# Patient Record
Sex: Male | Born: 1955 | Race: White | Hispanic: No | Marital: Married | Smoking: Former smoker
Health system: Southern US, Community
[De-identification: ages and names within clinical notes are randomized; demographics above are authoritative.]

## PROBLEM LIST (undated history)

## (undated) DIAGNOSIS — E119 Type 2 diabetes mellitus without complications: Secondary | ICD-10-CM

## (undated) DIAGNOSIS — I214 Non-ST elevation (NSTEMI) myocardial infarction: Secondary | ICD-10-CM

## (undated) DIAGNOSIS — C61 Malignant neoplasm of prostate: Secondary | ICD-10-CM

## (undated) DIAGNOSIS — E0590 Thyrotoxicosis, unspecified without thyrotoxic crisis or storm: Secondary | ICD-10-CM

## (undated) DIAGNOSIS — J45909 Unspecified asthma, uncomplicated: Secondary | ICD-10-CM

## (undated) DIAGNOSIS — I2510 Atherosclerotic heart disease of native coronary artery without angina pectoris: Secondary | ICD-10-CM

## (undated) DIAGNOSIS — I10 Essential (primary) hypertension: Secondary | ICD-10-CM

## (undated) DIAGNOSIS — J449 Chronic obstructive pulmonary disease, unspecified: Secondary | ICD-10-CM

## (undated) HISTORY — DX: Non-ST elevation (NSTEMI) myocardial infarction: I21.4

## (undated) HISTORY — DX: Type 2 diabetes mellitus without complications: E11.9

## (undated) HISTORY — PX: APPENDECTOMY: SHX54

## (undated) HISTORY — PX: TONSILLECTOMY: SUR1361

## (undated) HISTORY — DX: Essential (primary) hypertension: I10

---

## 2012-06-14 DIAGNOSIS — I214 Non-ST elevation (NSTEMI) myocardial infarction: Secondary | ICD-10-CM

## 2012-06-14 HISTORY — DX: Non-ST elevation (NSTEMI) myocardial infarction: I21.4

## 2012-12-05 ENCOUNTER — Telehealth: Payer: Self-pay

## 2012-12-05 NOTE — Telephone Encounter (Signed)
LATE ENTRY :   Pt was referred by Dr. Sherryll Burger for screening colonoscopy. I sent him a letter and he called yesterday and said he needs to schedule at a office and not hospital. [place] Spaniel he had spoke to insurance co and that is what they told him. He is going to call Dr. Margaretmary Eddy office and have them refer him to [place]. I am sending Dr. Sherryll Burger a letter also.

## 2013-02-03 DIAGNOSIS — R079 Chest pain, unspecified: Secondary | ICD-10-CM

## 2014-02-20 ENCOUNTER — Encounter: Payer: Self-pay | Admitting: Radiation Oncology

## 2014-04-12 ENCOUNTER — Other Ambulatory Visit: Payer: Self-pay | Admitting: Urology

## 2014-04-16 ENCOUNTER — Other Ambulatory Visit (HOSPITAL_COMMUNITY): Payer: Self-pay | Admitting: Urology

## 2014-04-16 DIAGNOSIS — C61 Malignant neoplasm of prostate: Secondary | ICD-10-CM

## 2014-05-03 ENCOUNTER — Encounter (HOSPITAL_COMMUNITY)
Admission: RE | Admit: 2014-05-03 | Discharge: 2014-05-03 | Disposition: A | Discharge status: Home / Self Care | Payer: BC Managed Care – PPO | Source: Ambulatory Visit | Attending: Urology | Admitting: Urology

## 2014-05-03 DIAGNOSIS — C61 Malignant neoplasm of prostate: Secondary | ICD-10-CM | POA: Diagnosis not present

## 2014-05-03 MED ORDER — TECHNETIUM TC 99M MEDRONATE IV KIT
26.2000 | PACK | Freq: Once | INTRAVENOUS | Status: AC | PRN
  Administered 2014-05-03: 26.2 via INTRAVENOUS

## 2014-06-20 NOTE — Patient Instructions (Addendum)
Hunter Jones  06/20/2014   Your procedure is scheduled on: 06/27/14   Report to [facility]  Entrance and follow signs to               [place] at 9:00  AM.   Call this number if you have problems the morning of surgery [phone]   Remember:  Do not eat food or drink liquids :After Midnight.     Take these medicines the morning of surgery with A SIP OF WATER: METOPROLOL / USE SYMBICORT INHALER / BRING ALBUTEROL [place] may not have any metal on your body including hair pins and              piercings  Do not wear jewelry, make-up, lotions, powders or perfumes.             Do not wear nail polish.  Do not shave  48 hours prior to surgery.              Men may shave face and neck.   Do not bring valuables to the hospital. [place].  Contacts, dentures or bridgework may not be worn into surgery.  Leave suitcase in the car. After surgery it may be brought to your room.     Patients discharged the day of surgery will not be allowed to drive home.  Name and phone number of your driver:  Special Instructions: N/A              Please read over the following fact sheets you were given: _____________________________________________________________________                                                     [place]  Before surgery, you can play an important role.  Because skin is not sterile, your skin needs to be as free of germs as possible.  You can reduce the number of germs on your skin by washing with CHG (chlorahexidine gluconate) soap before surgery.  CHG is an antiseptic cleaner which kills germs and bonds with the skin to continue killing germs even after washing. Please DO NOT use if you have an allergy to CHG or antibacterial soaps.  If your skin becomes reddened/irritated stop using the CHG and inform your nurse when  you arrive at Short Stay. Do not shave (including legs and underarms) for at least 48 hours prior to the first CHG shower.  You may shave your face. Please follow these instructions carefully:   1.  Shower with CHG Soap the night before surgery and the  morning of Surgery.   2.  If you choose to wash your hair, wash your hair first as usual with your  normal  Shampoo.   3.  After you shampoo, rinse your hair and body thoroughly to remove the  shampoo.  4.  Use CHG as you would any other liquid soap.  You can apply chg directly  to the skin and wash . Gently wash with scrungie or clean wascloth    5.  Apply the CHG Soap to your body ONLY FROM THE NECK DOWN.   Do not use on open                           Wound or open sores. Avoid contact with eyes, ears mouth and genitals (private parts).                        Genitals (private parts) with your normal soap.              6.  Wash thoroughly, paying special attention to the area where your surgery  will be performed.   7.  Thoroughly rinse your body with warm water from the neck down.   8.  DO NOT shower/wash with your normal soap after using and rinsing off  the CHG Soap .                9.  Pat yourself dry with a clean towel.             10.  Wear clean pajamas.             11.  Place clean sheets on your bed the night of your first shower and do not  sleep with pets.  Day of Surgery : Do not apply any lotions/deodorants the morning of surgery.  Please wear clean clothes to the hospital/surgery center.  FAILURE TO FOLLOW THESE INSTRUCTIONS MAY RESULT IN THE CANCELLATION OF YOUR SURGERY    PATIENT SIGNATURE_________________________________  ______________________________________________________________________     Hunter Jones  An incentive spirometer is a tool that can help keep your lungs clear and active. This tool measures how well you are filling your lungs with each  breath. Taking long deep breaths may help reverse or decrease the chance of developing breathing (pulmonary) problems (especially infection) following:  A long period of time when you are unable to move or be active. BEFORE THE PROCEDURE   If the spirometer includes an indicator to show your best effort, your nurse or respiratory therapist will set it to a desired goal.  If possible, sit up straight or lean slightly forward. Try not to slouch.  Hold the incentive spirometer in an upright position. INSTRUCTIONS FOR USE   Sit on the edge of your bed if possible, or sit up as far as you can in bed or on a chair.  Hold the incentive spirometer in an upright position.  Breathe out normally.  Place the mouthpiece in your mouth and seal your lips tightly around it.  Breathe in slowly and as deeply as possible, raising the piston or the ball toward the top of the column.  Hold your breath for 3-5 seconds or for as long as possible. Allow the piston or ball to fall to the bottom of the column.  Remove the mouthpiece from your mouth and breathe out normally.  Rest for a few seconds and repeat Steps 1 through 7 at least 10 times every 1-2 hours when you are awake. Take your time and take a few normal breaths between deep breaths.  The spirometer may include an indicator to show your best effort. Use the indicator as a goal to work toward during  each repetition.  After each set of 10 deep breaths, practice coughing to be sure your lungs are clear. If you have an incision (the cut made at the time of surgery), support your incision when coughing by placing a pillow or rolled up towels firmly against it. Once you are able to get out of bed, walk around indoors and cough well. You may stop using the incentive spirometer when instructed by your caregiver.  RISKS AND COMPLICATIONS  Take your time so you do not get dizzy or light-headed.  If you are in pain, you may need to take or ask for pain  medication before doing incentive spirometry. It is harder to take a deep breath if you are having pain. AFTER USE  Rest and breathe slowly and easily.  It can be helpful to keep track of a log of your progress. Your caregiver can provide you with a simple table to help with this. If you are using the spirometer at home, follow these instructions: [place] IF:   You are having difficultly using the spirometer.  You have trouble using the spirometer as often as instructed.  Your pain medication is not giving enough relief while using the spirometer.  You develop fever of 100.5 F (38.1 C) or higher. SEEK IMMEDIATE MEDICAL CARE IF:   You cough up bloody sputum that had not been present before.  You develop fever of 102 F (38.9 C) or greater.  You develop worsening pain at or near the incision site. MAKE SURE YOU:   Understand these instructions.  Will watch your condition.  Will get help right away if you are not doing well or get worse. Document Released: 10/11/2006 Document Revised: 08/23/2011 Document Reviewed: 12/12/2006 ExitCare Patient Information 2014 ExitCare, [place].   ________________________________________________________________________  WHAT IS A BLOOD TRANSFUSION? Blood Transfusion Information  A transfusion is the replacement of blood or some of its parts. Blood is made up of multiple cells which provide different functions.  Red blood cells carry oxygen and are used for blood loss replacement.  White blood cells fight against infection.  Platelets control bleeding.  Plasma helps clot blood.  Other blood products are available for specialized needs, such as hemophilia or other clotting disorders. BEFORE THE TRANSFUSION  Who gives blood for transfusions?   Healthy volunteers who are fully evaluated to make sure their blood is safe. This is blood bank blood. Transfusion therapy is the safest it has ever been in the practice of medicine.  Before blood is taken from a donor, a complete history is taken to make sure that person has no history of diseases nor engages in risky social behavior (examples are intravenous drug use or sexual activity with multiple partners). The donor's travel history is screened to minimize risk of transmitting infections, such as malaria. The donated blood is tested for signs of infectious diseases, such as HIV and hepatitis. The blood is then tested to be sure it is compatible with you in order to minimize the chance of a transfusion reaction. If you or a relative donates blood, this is often done in anticipation of surgery and is not appropriate for emergency situations. It takes many days to process the donated blood. RISKS AND COMPLICATIONS Although transfusion therapy is very safe and saves many lives, the main dangers of transfusion include:   Getting an infectious disease.  Developing a transfusion reaction. This is an allergic reaction to something in the blood you were given. Every precaution is taken to prevent  this. The decision to have a blood transfusion has been considered carefully by your caregiver before blood is given. Blood is not given unless the benefits outweigh the risks. AFTER THE TRANSFUSION  Right after receiving a blood transfusion, you will usually feel much better and more energetic. This is especially true if your red blood cells have gotten low (anemic). The transfusion raises the level of the red blood cells which carry oxygen, and this usually causes an energy increase.  The nurse administering the transfusion will monitor you carefully for complications. HOME CARE INSTRUCTIONS  No special instructions are needed after a transfusion. You may find your energy is better. Speak with your caregiver about any limitations on activity for underlying diseases you may have. SEEK MEDICAL CARE IF:   Your condition is not improving after your transfusion.  You develop redness or  irritation at the intravenous (IV) site. SEEK IMMEDIATE MEDICAL CARE IF:  Any of the following symptoms occur over the next 12 hours:  Shaking chills.  You have a temperature by mouth above 102 F (38.9 C), not controlled by medicine.  Chest, back, or muscle pain.  People around you feel you are not acting correctly or are confused.  Shortness of breath or difficulty breathing.  Dizziness and fainting.  You get a rash or develop hives.  You have a decrease in urine output.  Your urine turns a dark color or changes to pink, red, or brown. Any of the following symptoms occur over the next 10 days:  You have a temperature by mouth above 102 F (38.9 C), not controlled by medicine.  Shortness of breath.  Weakness after normal activity.  The white part of the eye turns yellow (jaundice).  You have a decrease in the amount of urine or are urinating less often.  Your urine turns a dark color or changes to pink, red, or brown. Document Released: 05/28/2000 Document Revised: 08/23/2011 Document Reviewed: 01/15/2008 ExitCare Patient Information 2014 ExitCare, [place].  _______________________________________________________________________   CLEAR LIQUID DIET   Foods Allowed                                                                     Foods Excluded  Coffee and tea, regular and decaf                             liquids that you cannot  Plain Jell-O in any flavor                                             see through such as: Fruit ices (not with fruit pulp)                                     milk, soups, orange juice  Iced Popsicles                                    All solid food  Carbonated beverages, regular and diet                                    Cranberry, grape and apple juices Sports drinks like Gatorade Lightly seasoned clear broth or consume(fat free) Sugar, honey syrup   _____________________________________________________________________

## 2014-06-21 ENCOUNTER — Ambulatory Visit (HOSPITAL_COMMUNITY)
Admission: RE | Admit: 2014-06-21 | Discharge: 2014-06-21 | Disposition: A | Discharge status: Home / Self Care | Payer: BLUE CROSS/BLUE SHIELD | Source: Ambulatory Visit | Attending: Anesthesiology | Admitting: Anesthesiology

## 2014-06-21 ENCOUNTER — Encounter (HOSPITAL_COMMUNITY)
Admission: RE | Admit: 2014-06-21 | Discharge: 2014-06-21 | Disposition: A | Discharge status: Home / Self Care | Payer: BLUE CROSS/BLUE SHIELD | Source: Ambulatory Visit | Attending: Urology | Admitting: Urology

## 2014-06-21 ENCOUNTER — Encounter (HOSPITAL_COMMUNITY): Payer: Self-pay

## 2014-06-21 DIAGNOSIS — Z01818 Encounter for other preprocedural examination: Secondary | ICD-10-CM | POA: Insufficient documentation

## 2014-06-21 DIAGNOSIS — C61 Malignant neoplasm of prostate: Secondary | ICD-10-CM | POA: Insufficient documentation

## 2014-06-21 HISTORY — DX: Chronic obstructive pulmonary disease, unspecified: J44.9

## 2014-06-21 HISTORY — DX: Atherosclerotic heart disease of native coronary artery without angina pectoris: I25.10

## 2014-06-21 HISTORY — DX: Thyrotoxicosis, unspecified without thyrotoxic crisis or storm: E05.90

## 2014-06-21 HISTORY — DX: Malignant neoplasm of prostate: C61

## 2014-06-21 LAB — CBC
HEMATOCRIT: 41.8 % (ref 39.0–52.0)
Hemoglobin: 14.4 g/dL (ref 13.0–17.0)
MCH: 33.3 pg (ref 26.0–34.0)
MCHC: 34.4 g/dL (ref 30.0–36.0)
MCV: 96.8 fL (ref 78.0–100.0)
Platelets: 157 10*3/uL (ref 150–400)
RBC: 4.32 MIL/uL (ref 4.22–5.81)
RDW: 12.5 % (ref 11.5–15.5)
WBC: 8.9 10*3/uL (ref 4.0–10.5)

## 2014-06-21 LAB — BASIC METABOLIC PANEL
ANION GAP: 8 (ref 5–15)
BUN: 22 mg/dL (ref 6–23)
CO2: 27 mmol/L (ref 19–32)
CREATININE: 0.95 mg/dL (ref 0.50–1.35)
Calcium: 9.5 mg/dL (ref 8.4–10.5)
Chloride: 107 mEq/L (ref 96–112)
GFR calc Af Amer: >90 mL/min (ref >90)
GFR calc non Af Amer: 89 mL/min — ABNORMAL LOW (ref >90)
GLUCOSE: 95 mg/dL (ref 70–99)
Potassium: 4.7 mmol/L (ref 3.5–5.1)
Sodium: 142 mmol/L (ref 135–145)

## 2014-06-21 NOTE — Progress Notes (Signed)
   06/21/14 0933  OBSTRUCTIVE SLEEP APNEA  Have you ever been diagnosed with sleep apnea through a sleep study? No  Do you snore loudly (loud enough to be heard through closed doors)?  0  Do you often feel tired, fatigued, or sleepy during the daytime? 0  Has anyone observed you stop breathing during your sleep? 0  Do you have, or are you being treated for high blood pressure? 1  BMI more than 35 kg/m2? 1  Age over 61 years old? 1  Neck circumference greater than 40 cm/16 inches? 1  Gender: 1  Obstructive Sleep Apnea Score 5  Score 4 or greater  Results sent to PCP

## 2014-06-26 NOTE — H&P (Signed)
Chief Complaint Prostate Cancer    History of Present Illness [name] is a 59 year old gentleman who has a history of an elevated PSA that was apparently 6.4 when he was first evaluated by Dr. Exie Parody in 2014. He was unable to proceed with a biopsy at that time as he had recently had a cardiac stent requiring antiplatelet therapy with Plavix for one year. He therefore delayed his evaluation for this reason and his PSA increased further to 9.6 in 2015. He finally was able to discontinue his Plavix for a prostate biopsy on 02/11/14. 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 He has been thoroughly counseled about his treatment options by both Dr. Exie Parody and Dr. Ledon Snare (Radiation Oncology). He has not yet undergone staging studies. He did apparently receive an LHRH injection in the fall.    ** His comorbid conditions include coronary artery disease s/p stent placement in 2014 and managed with antiplatelet therapy. He currently is no longer taking Plavix but does take aspirin 81 mg. He has undergone a prior appendectomy.    TNM stage: cT3a Nx Mx ([place] and base)  PSA: 9.6  Gleason score: 4+4=8  Biopsy (02/11/14 -read by Dr. Mark Martinique, [facility], Acc # [id number]): 5/6 cores positive  Prostate volume: 39.4 cc    Nomogram  OC disease: 12 %  EPE: 85%  SVI: 51%  LNI: 51%  PFS (surgery): 24% at 5 years, 15% at 10 years    Urinary function: He has moderate lower urinary tract symptoms. IPSS is 9.  Erectile function: He does have erectile dysfunction. SHIM score is 1.   Past Medical History Problems  1. History of hyperthyroidism (Z86.39) 2. History of myocardial infarction (I25.2)  Surgical History Problems  1. History of Appendectomy 2. History of Cath Stent 1 Type Drug-Eluting 3. History of Tonsillectomy  Current Meds 1. Aspir-81 81 MG Oral Tablet Delayed Release;  Therapy: (Recorded:29Oct2015)  to Recorded 2. Lipitor 40 MG Oral Tablet (Atorvastatin Calcium);  Therapy: (Recorded:29Oct2015) to Recorded 3. Methimazole 10 MG Oral Tablet;  Therapy: (Recorded:29Oct2015) to Recorded 4. Metoprolol Succinate ER 50 MG Oral Tablet Extended Release 24 Hour;  Therapy: (Recorded:29Oct2015) to Recorded 5. Symbicort 160-4.5 MCG/ACT Inhalation Aerosol;  Therapy: ([id number]) to Recorded 6. Ventolin 90 MCG/ACT AERS (Albuterol);  Therapy: (Recorded:29Oct2015) to Recorded  Allergies Medication  1. Penicillins  Family History Problems  1. Family history of blood clots (Z82.49) : Mother 2. Family history of Gall stones : Brother 3. Family history of Urinary calculus : Father  Social History Problems    Alcohol use (F10.99)   up to 1 daily, not an every day drinker   Former smoker [phone])   Married   Occupation   Administrator  Review of Systems Genitourinary, constitutional, skin, eye, otolaryngeal, hematologic/lymphatic, cardiovascular, pulmonary, endocrine, musculoskeletal, gastrointestinal, neurological and psychiatric system(s) were reviewed and pertinent findings if present are noted.  Constitutional: night sweats and feeling tired (fatigue).  ENT: sinus problems.  Respiratory: shortness of breath.  Musculoskeletal: back pain and joint pain.  Psychiatric: anxiety.    Vitals  Height: 5 ft 11 in Weight: 285 lb  BMI Calculated: 39.75 BSA Calculated: 2.45   Physical Exam Constitutional: Well nourished and well developed . No acute distress.  ENT:. The ears and nose are normal in appearance.  Neck: The appearance of the neck is normal and no neck mass is present.  Pulmonary: No respiratory distress, normal respiratory rhythm and  effort and clear bilateral breath sounds.  Cardiovascular: Heart rate and rhythm are normal . No peripheral edema.  Abdomen: The abdomen is obese. The abdomen is soft and nontender. No masses are palpated. No CVA tenderness. No hernias  are palpable. No hepatosplenomegaly noted.    Assessment Assessed  1. Prostate cancer (C61)     Discussion/Summary 1. Locally advanced prostate cancer: He will undergo a non-nerve sparing robotic-assisted laparoscopic radical prostatectomy and bilateral pelvic lymphadenectomy in early January.

## 2014-06-27 ENCOUNTER — Inpatient Hospital Stay (HOSPITAL_COMMUNITY)
Admission: RE | Admit: 2014-06-27 | Discharge: 2014-06-28 | DRG: 708 | Disposition: A | Discharge status: Home / Self Care | Payer: BLUE CROSS/BLUE SHIELD | Source: Ambulatory Visit | Attending: Urology | Admitting: Urology

## 2014-06-27 ENCOUNTER — Inpatient Hospital Stay (HOSPITAL_COMMUNITY): Payer: BLUE CROSS/BLUE SHIELD | Admitting: Anesthesiology

## 2014-06-27 ENCOUNTER — Encounter (HOSPITAL_COMMUNITY)
Admission: RE | Disposition: A | Discharge status: Home / Self Care | Payer: Self-pay | Source: Ambulatory Visit | Attending: Urology

## 2014-06-27 ENCOUNTER — Encounter (HOSPITAL_COMMUNITY): Payer: Self-pay | Admitting: *Deleted

## 2014-06-27 DIAGNOSIS — Z6839 Body mass index (BMI) 39.0-39.9, adult: Secondary | ICD-10-CM | POA: Diagnosis not present

## 2014-06-27 DIAGNOSIS — Z87891 Personal history of nicotine dependence: Secondary | ICD-10-CM | POA: Diagnosis not present

## 2014-06-27 DIAGNOSIS — C61 Malignant neoplasm of prostate: Principal | ICD-10-CM | POA: Diagnosis present

## 2014-06-27 DIAGNOSIS — I2510 Atherosclerotic heart disease of native coronary artery without angina pectoris: Secondary | ICD-10-CM | POA: Diagnosis present

## 2014-06-27 DIAGNOSIS — Z955 Presence of coronary angioplasty implant and graft: Secondary | ICD-10-CM

## 2014-06-27 DIAGNOSIS — I252 Old myocardial infarction: Secondary | ICD-10-CM

## 2014-06-27 DIAGNOSIS — J449 Chronic obstructive pulmonary disease, unspecified: Secondary | ICD-10-CM | POA: Diagnosis present

## 2014-06-27 DIAGNOSIS — N529 Male erectile dysfunction, unspecified: Secondary | ICD-10-CM | POA: Diagnosis present

## 2014-06-27 DIAGNOSIS — Z9049 Acquired absence of other specified parts of digestive tract: Secondary | ICD-10-CM | POA: Diagnosis present

## 2014-06-27 DIAGNOSIS — Z79899 Other long term (current) drug therapy: Secondary | ICD-10-CM | POA: Diagnosis not present

## 2014-06-27 DIAGNOSIS — Z7982 Long term (current) use of aspirin: Secondary | ICD-10-CM | POA: Diagnosis not present

## 2014-06-27 HISTORY — PX: ROBOT ASSISTED LAPAROSCOPIC RADICAL PROSTATECTOMY: SHX5141

## 2014-06-27 HISTORY — DX: Malignant neoplasm of prostate: C61

## 2014-06-27 HISTORY — PX: LYMPHADENECTOMY: SHX5960

## 2014-06-27 LAB — HEMOGLOBIN AND HEMATOCRIT, BLOOD
HCT: 40.6 % (ref 39.0–52.0)
Hemoglobin: 14.5 g/dL (ref 13.0–17.0)

## 2014-06-27 LAB — TYPE AND SCREEN
ABO/RH(D): O POS
Antibody Screen: NEGATIVE

## 2014-06-27 LAB — ABO/RH: ABO/RH(D): O POS

## 2014-06-27 SURGERY — ROBOTIC ASSISTED LAPAROSCOPIC RADICAL PROSTATECTOMY LEVEL 3
Anesthesia: General

## 2014-06-27 MED ORDER — PROPOFOL 10 MG/ML IV BOLUS
INTRAVENOUS | Status: AC
  Filled 2014-06-27: qty 20

## 2014-06-27 MED ORDER — DOCUSATE SODIUM 100 MG PO CAPS
100.0000 mg | ORAL_CAPSULE | Freq: Two times a day (BID) | ORAL | Status: DC
  Administered 2014-06-27 – 2014-06-28 (×2): 100 mg via ORAL
  Filled 2014-06-27 (×3): qty 1

## 2014-06-27 MED ORDER — DIPHENHYDRAMINE HCL 50 MG/ML IJ SOLN
12.5000 mg | Freq: Four times a day (QID) | INTRAMUSCULAR | Status: DC | PRN
Start: 2014-06-27 — End: 2014-06-28

## 2014-06-27 MED ORDER — SUCCINYLCHOLINE CHLORIDE 20 MG/ML IJ SOLN
INTRAMUSCULAR | Status: DC | PRN
  Administered 2014-06-27: 180 mg via INTRAVENOUS

## 2014-06-27 MED ORDER — STERILE WATER FOR IRRIGATION IR SOLN
Status: DC | PRN
  Administered 2014-06-27: 1500 mL

## 2014-06-27 MED ORDER — BUDESONIDE-FORMOTEROL FUMARATE 160-4.5 MCG/ACT IN AERO
2.0000 | INHALATION_SPRAY | Freq: Two times a day (BID) | RESPIRATORY_TRACT | Status: DC
  Administered 2014-06-27 – 2014-06-28 (×2): 2 via RESPIRATORY_TRACT
  Filled 2014-06-27: qty 6

## 2014-06-27 MED ORDER — HYDROMORPHONE HCL 1 MG/ML IJ SOLN
INTRAMUSCULAR | Status: AC
  Filled 2014-06-27: qty 1

## 2014-06-27 MED ORDER — LACTATED RINGERS IV SOLN: INTRAVENOUS | Status: DC

## 2014-06-27 MED ORDER — PROPOFOL 10 MG/ML IV BOLUS
INTRAVENOUS | Status: DC | PRN
  Administered 2014-06-27: 200 mg via INTRAVENOUS

## 2014-06-27 MED ORDER — SODIUM CHLORIDE 0.9 % IV BOLUS (SEPSIS)
1000.0000 mL | Freq: Once | INTRAVENOUS | Status: AC
  Administered 2014-06-27: 1000 mL via INTRAVENOUS

## 2014-06-27 MED ORDER — HYDROMORPHONE HCL 1 MG/ML IJ SOLN
INTRAMUSCULAR | Status: DC | PRN
  Administered 2014-06-27: 1 mg via INTRAVENOUS

## 2014-06-27 MED ORDER — BUPIVACAINE-EPINEPHRINE 0.25% -1:200000 IJ SOLN
INTRAMUSCULAR | Status: DC | PRN
  Administered 2014-06-27: 30 mL

## 2014-06-27 MED ORDER — GLYCOPYRROLATE 0.2 MG/ML IJ SOLN
INTRAMUSCULAR | Status: AC
  Filled 2014-06-27: qty 2

## 2014-06-27 MED ORDER — METHIMAZOLE 10 MG PO TABS
10.0000 mg | ORAL_TABLET | Freq: Every day | ORAL | Status: DC
  Administered 2014-06-27: 10 mg via ORAL
  Filled 2014-06-27 (×2): qty 1

## 2014-06-27 MED ORDER — MIDAZOLAM HCL 5 MG/5ML IJ SOLN
INTRAMUSCULAR | Status: DC | PRN
  Administered 2014-06-27: 2 mg via INTRAVENOUS

## 2014-06-27 MED ORDER — BUPIVACAINE-EPINEPHRINE (PF) 0.25% -1:200000 IJ SOLN
INTRAMUSCULAR | Status: AC
  Filled 2014-06-27: qty 30

## 2014-06-27 MED ORDER — HYDROMORPHONE HCL 1 MG/ML IJ SOLN
0.2500 mg | INTRAMUSCULAR | Status: DC | PRN
  Administered 2014-06-27 (×2): 0.25 mg via INTRAVENOUS

## 2014-06-27 MED ORDER — ATORVASTATIN CALCIUM 40 MG PO TABS
40.0000 mg | ORAL_TABLET | Freq: Every day | ORAL | Status: DC
  Administered 2014-06-27: 40 mg via ORAL
  Filled 2014-06-27 (×2): qty 1

## 2014-06-27 MED ORDER — NEOSTIGMINE METHYLSULFATE 10 MG/10ML IV SOLN
INTRAVENOUS | Status: DC | PRN
  Administered 2014-06-27: 4 mg via INTRAVENOUS

## 2014-06-27 MED ORDER — ONDANSETRON HCL 4 MG/2ML IJ SOLN
INTRAMUSCULAR | Status: AC
  Filled 2014-06-27: qty 2

## 2014-06-27 MED ORDER — FENTANYL CITRATE 0.05 MG/ML IJ SOLN
INTRAMUSCULAR | Status: AC
  Filled 2014-06-27: qty 5

## 2014-06-27 MED ORDER — ROCURONIUM BROMIDE 100 MG/10ML IV SOLN
INTRAVENOUS | Status: DC | PRN
  Administered 2014-06-27 (×2): 10 mg via INTRAVENOUS
  Administered 2014-06-27: 50 mg via INTRAVENOUS
  Administered 2014-06-27 (×2): 10 mg via INTRAVENOUS

## 2014-06-27 MED ORDER — KCL IN DEXTROSE-NACL 20-5-0.45 MEQ/L-%-% IV SOLN
INTRAVENOUS | Status: DC
  Administered 2014-06-27: 19:00:00 via INTRAVENOUS
  Filled 2014-06-27 (×4): qty 1000

## 2014-06-27 MED ORDER — NEOSTIGMINE METHYLSULFATE 10 MG/10ML IV SOLN
INTRAVENOUS | Status: AC
  Filled 2014-06-27: qty 1

## 2014-06-27 MED ORDER — MORPHINE SULFATE 2 MG/ML IJ SOLN: 2.0000 mg | INTRAMUSCULAR | Status: DC | PRN

## 2014-06-27 MED ORDER — ONDANSETRON HCL 4 MG/2ML IJ SOLN
INTRAMUSCULAR | Status: DC | PRN
Start: 2014-06-27 — End: 2014-06-27
  Administered 2014-06-27: 4 mg via INTRAVENOUS

## 2014-06-27 MED ORDER — METOPROLOL SUCCINATE ER 50 MG PO TB24
50.0000 mg | ORAL_TABLET | Freq: Every day | ORAL | Status: DC
  Administered 2014-06-28: 50 mg via ORAL
  Filled 2014-06-27: qty 1

## 2014-06-27 MED ORDER — KETOROLAC TROMETHAMINE 15 MG/ML IJ SOLN
15.0000 mg | Freq: Four times a day (QID) | INTRAMUSCULAR | Status: DC
  Administered 2014-06-27 – 2014-06-28 (×3): 15 mg via INTRAVENOUS
  Filled 2014-06-27 (×6): qty 1

## 2014-06-27 MED ORDER — ASPIRIN EC 81 MG PO TBEC
81.0000 mg | DELAYED_RELEASE_TABLET | Freq: Every day | ORAL | Status: DC
  Administered 2014-06-27: 81 mg via ORAL
  Filled 2014-06-27 (×2): qty 1

## 2014-06-27 MED ORDER — CEFAZOLIN SODIUM 1-5 GM-% IV SOLN
1.0000 g | Freq: Three times a day (TID) | INTRAVENOUS | Status: AC
  Administered 2014-06-27 – 2014-06-28 (×2): 1 g via INTRAVENOUS
  Filled 2014-06-27 (×2): qty 50

## 2014-06-27 MED ORDER — DEXAMETHASONE SODIUM PHOSPHATE 10 MG/ML IJ SOLN
INTRAMUSCULAR | Status: DC | PRN
  Administered 2014-06-27: 10 mg via INTRAVENOUS

## 2014-06-27 MED ORDER — DIPHENHYDRAMINE HCL 12.5 MG/5ML PO ELIX: 12.5000 mg | ORAL_SOLUTION | Freq: Four times a day (QID) | ORAL | Status: DC | PRN

## 2014-06-27 MED ORDER — GLYCOPYRROLATE 0.2 MG/ML IJ SOLN
INTRAMUSCULAR | Status: DC | PRN
  Administered 2014-06-27: 0.2 mg via INTRAVENOUS
  Administered 2014-06-27: .8 mg via INTRAVENOUS

## 2014-06-27 MED ORDER — ALBUTEROL SULFATE (2.5 MG/3ML) 0.083% IN NEBU: 2.5000 mg | INHALATION_SOLUTION | Freq: Four times a day (QID) | RESPIRATORY_TRACT | Status: DC | PRN

## 2014-06-27 MED ORDER — ALBUTEROL SULFATE HFA 108 (90 BASE) MCG/ACT IN AERS: 1.0000 | INHALATION_SPRAY | Freq: Four times a day (QID) | RESPIRATORY_TRACT | Status: DC | PRN

## 2014-06-27 MED ORDER — HEPARIN SODIUM (PORCINE) 1000 UNIT/ML IJ SOLN
INTRAMUSCULAR | Status: AC
  Filled 2014-06-27: qty 1

## 2014-06-27 MED ORDER — MIDAZOLAM HCL 2 MG/2ML IJ SOLN
INTRAMUSCULAR | Status: AC
  Filled 2014-06-27: qty 2

## 2014-06-27 MED ORDER — CEFAZOLIN SODIUM 10 G IJ SOLR
3.0000 g | INTRAMUSCULAR | Status: AC
  Administered 2014-06-27: 3 g via INTRAVENOUS
  Filled 2014-06-27: qty 3000

## 2014-06-27 MED ORDER — SODIUM CHLORIDE 0.9 % IR SOLN
Status: DC | PRN
  Administered 2014-06-27: 300 mL via INTRAVESICAL

## 2014-06-27 MED ORDER — CIPROFLOXACIN HCL 500 MG PO TABS: 500.0000 mg | ORAL_TABLET | Freq: Two times a day (BID) | ORAL | Status: DC

## 2014-06-27 MED ORDER — ACETAMINOPHEN 325 MG PO TABS: 650.0000 mg | ORAL_TABLET | ORAL | Status: DC | PRN

## 2014-06-27 MED ORDER — FENTANYL CITRATE 0.05 MG/ML IJ SOLN
INTRAMUSCULAR | Status: DC | PRN
  Administered 2014-06-27 (×2): 50 ug via INTRAVENOUS
  Administered 2014-06-27: 100 ug via INTRAVENOUS
  Administered 2014-06-27: 50 ug via INTRAVENOUS

## 2014-06-27 MED ORDER — HYDROCODONE-ACETAMINOPHEN 5-325 MG PO TABS
1.0000 | ORAL_TABLET | Freq: Four times a day (QID) | ORAL | Status: DC | PRN
Start: 2014-06-27 — End: 2016-08-06

## 2014-06-27 MED ORDER — HYDROMORPHONE HCL 2 MG/ML IJ SOLN
INTRAMUSCULAR | Status: AC
Start: 2014-06-27 — End: 2014-06-27
  Filled 2014-06-27: qty 1

## 2014-06-27 MED ORDER — LACTATED RINGERS IV SOLN
INTRAVENOUS | Status: DC
  Administered 2014-06-27: 11:00:00 via INTRAVENOUS
  Administered 2014-06-27 (×2): 1000 mL via INTRAVENOUS

## 2014-06-27 SURGICAL SUPPLY — 48 items
CABLE HIGH FREQUENCY MONO STRZ (ELECTRODE) ×4 IMPLANT
CATH FOLEY 2WAY SLVR 18FR 30CC (CATHETERS) ×4 IMPLANT
CATH ROBINSON RED A/P 16FR (CATHETERS) ×4 IMPLANT
CATH ROBINSON RED A/P 8FR (CATHETERS) ×4 IMPLANT
CATH TIEMANN FOLEY 18FR 5CC (CATHETERS) ×4 IMPLANT
CHLORAPREP W/TINT 26ML (MISCELLANEOUS) ×4 IMPLANT
CLIP LIGATING HEM O LOK PURPLE (MISCELLANEOUS) ×16 IMPLANT
CLOTH BEACON ORANGE TIMEOUT ST (SAFETY) ×4 IMPLANT
COVER SURGICAL LIGHT HANDLE (MISCELLANEOUS) ×4 IMPLANT
COVER TIP SHEARS 8 DVNC (MISCELLANEOUS) ×2 IMPLANT
COVER TIP SHEARS 8MM DA VINCI (MISCELLANEOUS) ×2
CUTTER ECHEON FLEX ENDO 45 340 (ENDOMECHANICALS) ×4 IMPLANT
DECANTER SPIKE VIAL GLASS SM (MISCELLANEOUS) ×4 IMPLANT
DRAPE SURG IRRIG POUCH 19X23 (DRAPES) ×4 IMPLANT
DRSG TEGADERM 4X4.75 (GAUZE/BANDAGES/DRESSINGS) ×4 IMPLANT
DRSG TEGADERM 6X8 (GAUZE/BANDAGES/DRESSINGS) ×8 IMPLANT
ELECT REM PT RETURN 9FT ADLT (ELECTROSURGICAL) ×4
ELECTRODE REM PT RTRN 9FT ADLT (ELECTROSURGICAL) ×2 IMPLANT
GLOVE BIO SURGEON STRL SZ 6.5 (GLOVE) ×3 IMPLANT
GLOVE BIO SURGEONS STRL SZ 6.5 (GLOVE) ×1
GLOVE BIOGEL M STRL SZ7.5 (GLOVE) ×8 IMPLANT
GOWN STRL REUS W/TWL LRG LVL3 (GOWN DISPOSABLE) ×12 IMPLANT
HOLDER FOLEY CATH W/STRAP (MISCELLANEOUS) ×4 IMPLANT
IV LACTATED RINGERS 1000ML (IV SOLUTION) ×4 IMPLANT
KIT ACCESSORY DA VINCI DISP (KITS) ×2
KIT ACCESSORY DVNC DISP (KITS) ×2 IMPLANT
LIQUID BAND (GAUZE/BANDAGES/DRESSINGS) IMPLANT
NDL SAFETY ECLIPSE 18X1.5 (NEEDLE) ×2 IMPLANT
NEEDLE HYPO 18GX1.5 SHARP (NEEDLE) ×2
PACK ROBOT UROLOGY CUSTOM (CUSTOM PROCEDURE TRAY) ×4 IMPLANT
RELOAD GREEN ECHELON 45 (STAPLE) ×4 IMPLANT
SET TUBE IRRIG SUCTION NO TIP (IRRIGATION / IRRIGATOR) ×4 IMPLANT
SOLUTION ELECTROLUBE (MISCELLANEOUS) ×4 IMPLANT
SUT ETHILON 3 0 PS 1 (SUTURE) ×4 IMPLANT
SUT MNCRL 3 0 RB1 (SUTURE) ×2 IMPLANT
SUT MNCRL 3 0 VIOLET RB1 (SUTURE) ×2 IMPLANT
SUT MNCRL AB 4-0 PS2 18 (SUTURE) ×8 IMPLANT
SUT MONOCRYL 3 0 RB1 (SUTURE) ×4
SUT VIC AB 0 CT1 27 (SUTURE) ×2
SUT VIC AB 0 CT1 27XBRD ANTBC (SUTURE) ×2 IMPLANT
SUT VIC AB 0 UR5 27 (SUTURE) ×4 IMPLANT
SUT VIC AB 2-0 SH 27 (SUTURE) ×2
SUT VIC AB 2-0 SH 27X BRD (SUTURE) ×2 IMPLANT
SUT VICRYL 0 UR6 27IN ABS (SUTURE) ×8 IMPLANT
SYR 27GX1/2 1ML LL SAFETY (SYRINGE) ×4 IMPLANT
TOWEL OR 17X26 10 PK STRL BLUE (TOWEL DISPOSABLE) ×4 IMPLANT
TOWEL OR NON WOVEN STRL DISP B (DISPOSABLE) ×4 IMPLANT
WATER STERILE IRR 1500ML POUR (IV SOLUTION) ×8 IMPLANT

## 2014-06-27 NOTE — Anesthesia Postprocedure Evaluation (Signed)
  Anesthesia Post-op Note  Patient: Hunter Jones  Procedure(s) Performed: Procedure(s) (LRB): ROBOTIC ASSISTED LAPAROSCOPIC RADICAL PROSTATECTOMY LEVEL 3 (N/A) LYMPHADENECTOMY (Bilateral)  Patient Location: PACU  Anesthesia Type: General  Level of Consciousness: awake and alert   Airway and Oxygen Therapy: Patient Spontanous Breathing  Post-op Pain: mild  Post-op Assessment: Post-op Vital signs reviewed, Patient's Cardiovascular Status Stable, Respiratory Function Stable, Patent Airway and No signs of Nausea or vomiting  Last Vitals:  Filed Vitals:   06/27/14 1615  BP: 133/62  Pulse: 86  Temp:   Resp: 12    Post-op Vital Signs: stable   Complications: No apparent anesthesia complications

## 2014-06-27 NOTE — Anesthesia Preprocedure Evaluation (Addendum)
Anesthesia Evaluation  Patient identified by MRN, date of birth, ID band Patient awake    Reviewed: Allergy & Precautions, H&P , NPO status , Patient's Chart, lab work & pertinent test results, reviewed documented beta blocker date and time   Airway Mallampati: III  TM Distance: >3 FB Neck ROM: full    Dental no notable dental hx. (+) Teeth Intact, Dental Advisory Given   Pulmonary neg pulmonary ROS, shortness of breath and with exertion, COPD COPD inhaler, former smoker,  breath sounds clear to auscultation  Pulmonary exam normal       Cardiovascular Exercise Tolerance: Good hypertension, Pt. on home beta blockers + CAD and + Past MI negative cardio ROS  Rhythm:regular Rate:Normal  MI 2014   Neuro/Psych negative neurological ROS  negative psych ROS   GI/Hepatic negative GI ROS, Neg liver ROS,   Endo/Other  negative endocrine ROSMorbid obesity  Renal/GU negative Renal ROS  negative genitourinary   Musculoskeletal   Abdominal (+) + obese,   Peds  Hematology negative hematology ROS (+)   Anesthesia Other Findings   Reproductive/Obstetrics negative OB ROS                            Anesthesia Physical Anesthesia Plan  ASA: III  Anesthesia Plan: General   Post-op Pain Management:    Induction: Intravenous  Airway Management Planned: Oral ETT  Additional Equipment:   Intra-op Plan:   Post-operative Plan: Extubation in OR  Informed Consent: I have reviewed the patients History and Physical, chart, labs and discussed the procedure including the risks, benefits and alternatives for the proposed anesthesia with the patient or authorized representative who has indicated his/her understanding and acceptance.   Dental Advisory Given  Plan Discussed with: CRNA and Surgeon  Anesthesia Plan Comments:         Anesthesia Quick Evaluation

## 2014-06-27 NOTE — Discharge Instructions (Signed)

## 2014-06-27 NOTE — Progress Notes (Signed)
Patient ID: Hunter Jones, male   DOB: 11/29/1955, 59 y.o.   MRN: [id number]  Post-op note  Subjective: The patient is doing well.  No complaints.  Objective: Vital signs in last 24 hours: Temp:  [97.3 F (36.3 C)-97.9 F (36.6 C)] 97.3 F (36.3 C) (01/14 1630) Pulse Rate:  [84-108] 90 (01/14 1630) Resp:  [11-18] 12 (01/14 1630) BP: (129-181)/(62-108) 151/76 mmHg (01/14 1630) SpO2:  [67 %-100 %] 100 % (01/14 1630) Weight:  [127.007 kg (280 lb)-130.182 kg (287 lb)] 127.007 kg (280 lb) (01/14 1640)  Intake/Output from previous day:   Intake/Output this shift:    Physical Exam:  General: Alert and oriented. Abdomen: Soft, Nondistended. Incisions: Clean and dry. GU: Urine draining well.  Lab Results:  Recent Labs  06/27/14 1505  HGB 14.5  HCT 40.6    Assessment/Plan: POD#0   1) Continue to monitor, ambulate, IS   Hunter Jones. MD   LOS: 0 days   Zamyah Wiesman,LES 06/27/2014, 9:11 PM

## 2014-06-27 NOTE — Transfer of Care (Signed)
Immediate Anesthesia Transfer of Care Note  Patient: Hunter Jones  Procedure(s) Performed: Procedure(s) (LRB): ROBOTIC ASSISTED LAPAROSCOPIC RADICAL PROSTATECTOMY LEVEL 3 (N/A) LYMPHADENECTOMY (Bilateral)  Patient Location: PACU  Anesthesia Type: General  Level of Consciousness: sedated, patient cooperative and responds to stimulation  Airway & Oxygen Therapy: Patient Spontanous Breathing and Patient connected to face mask oxgen  Post-op Assessment: Report given to PACU RN and Post -op Vital signs reviewed and stable  Post vital signs: Reviewed and stable  Complications: No apparent anesthesia complications

## 2014-06-27 NOTE — Anesthesia Procedure Notes (Signed)
Procedure Name: Intubation Date/Time: 06/27/2014 11:20 AM Performed by: Anne Fu Pre-anesthesia Checklist: Patient identified, Emergency Drugs available, Suction available, Patient being monitored and Timeout performed Patient Re-evaluated:Patient Re-evaluated prior to inductionOxygen Delivery Method: Circle system utilized Preoxygenation: Pre-oxygenation with 100% oxygen Intubation Type: IV induction Ventilation: Mask ventilation without difficulty Laryngoscope Size: Mac and 4 Grade View: Grade I Tube type: Oral Tube size: 7.5 mm Number of attempts: 1 Airway Equipment and Method: Stylet and Video-laryngoscopy (DL X 1 with noted GRADE III view converted to Glidescope. GRADE I View X 1 ) Placement Confirmation: ETT inserted through vocal cords under direct vision,  positive ETCO2,  CO2 detector and breath sounds checked- equal and bilateral Secured at: 23 cm Tube secured with: Tape Dental Injury: Teeth and Oropharynx as per pre-operative assessment

## 2014-06-27 NOTE — Op Note (Signed)
Preoperative diagnosis: Clinically localized adenocarcinoma of the prostate (clinical stage T3a N0 M0)  Postoperative diagnosis: Clinically localized adenocarcinoma of the prostate (clinical stage T3a N0 M0)  Procedure:  1. Robotic assisted laparoscopic radical prostatectomy (non nerve sparing) 2. Bilateral robotic assisted laparoscopic pelvic lymphadenectomy  Surgeon: Pryor Curia. M.D.  Assistant(s): Debbrah Alar, PA-C  Resident: Dr. Park Liter  Anesthesia: General  Complications: None  EBL: 50 mL  IVF:  1500 mL crystalloid  Specimens: 1. Prostate and seminal vesicles 2. Right pelvic lymph nodes 3. Left pelvic lymph nodes  Disposition of specimens: Pathology  Drains: 1. 20 Fr coude catheter 2. # 19 Blake pelvic drain  Indication: Hunter Jones is a 59 y.o. year old patient with locally advanced prostate cancer.  After a thorough review of the management options for treatment of prostate cancer, he elected to proceed with surgical therapy and the above procedure(s).  We have discussed the potential benefits and risks of the procedure, side effects of the proposed treatment, the likelihood of the patient achieving the goals of the procedure, and any potential problems that might occur during the procedure or recuperation. Informed consent has been obtained.  Description of procedure:  The patient was taken to the operating room and a general anesthetic was administered. He was given preoperative antibiotics, placed in the dorsal lithotomy position, and prepped and draped in the usual sterile fashion. Next a preoperative timeout was performed. A urethral catheter was placed into the bladder and a site was selected near the umbilicus for placement of the camera port. This was placed using a standard open Hassan technique which allowed entry into the peritoneal cavity under direct vision and without difficulty. A 12 mm port was placed and a pneumoperitoneum established.  The camera was then used to inspect the abdomen and there was no evidence of any intra-abdominal injuries or other abnormalities. The remaining abdominal ports were then placed. 8 mm robotic ports were placed in the right lower quadrant, left lower quadrant, and far left lateral abdominal wall. A 5 mm port was placed in the right upper quadrant and a 12 mm port was placed in the right lateral abdominal wall for laparoscopic assistance. All ports were placed under direct vision without difficulty. The surgical cart was then docked.   Utilizing the cautery scissors, the bladder was reflected posteriorly allowing entry into the space of Retzius and identification of the endopelvic fascia and prostate. The periprostatic fat was then removed from the prostate allowing full exposure of the endopelvic fascia. The endopelvic fascia was then incised from the apex back to the base of the prostate bilaterally and the underlying levator muscle fibers were swept laterally off the prostate thereby isolating the dorsal venous complex. The dorsal vein was then stapled and divided with a 45 mm Flex Echelon stapler. Attention then turned to the bladder neck which was divided anteriorly thereby allowing entry into the bladder and exposure of the urethral catheter. The catheter balloon was deflated and the catheter was brought into the operative field and used to retract the prostate anteriorly. The posterior bladder neck was then examined and was divided allowing further dissection between the bladder and prostate posteriorly until the vasa deferentia and seminal vessels were identified. The vasa deferentia were isolated, divided, and lifted anteriorly. The seminal vesicles were dissected down to their tips with care to control the seminal vascular arterial blood supply. These structures were then lifted anteriorly and the space between Denonvillier's fascia and the anterior rectum  was developed with a combination of sharp and blunt  dissection. This isolated the vascular pedicles of the prostate.  A wide non nerve sparing dissection was performed with Weck clips used to ligate the vascular pedicles of the prostate bilaterally. The vascular pedicles of the prostate were then divided.  The urethra was then sharply transected allowing the prostate specimen to be disarticulated. The pelvis was copiously irrigated and hemostasis was ensured. There was no evidence for rectal injury.  Attention then turned to the right pelvic sidewall. The fibrofatty tissue between the external iliac vein, confluence of the iliac vessels, hypogastric artery, and Cooper's ligament was dissected free from the pelvic sidewall with care to preserve the obturator nerve. Weck clips were used for lymphostasis and hemostasis. An identical procedure was performed on the contralateral side and the lymphatic packets were removed for permanent pathologic analysis.  Attention then turned to the urethral anastomosis. A 2-0 Vicryl slip knot was placed between Denonvillier's fascia, the posterior bladder neck, and the posterior urethra to reapproximate these structures. A double-armed 3-0 Monocryl suture was then used to perform a 360 running tension-free anastomosis between the bladder neck and urethra. A new urethral catheter was then placed into the bladder and irrigated. There were no blood clots within the bladder and the anastomosis appeared to be watertight. A #19 Blake drain was then brought through the left lateral 8 mm port site and positioned appropriately within the pelvis. It was secured to the skin with a nylon suture. The surgical cart was then undocked. The right lateral 12 mm port site was closed at the fascial level with a 0 Vicryl suture placed laparoscopically. All remaining ports were then removed under direct vision. The prostate specimen was removed intact within the Endopouch retrieval bag via the periumbilical camera port site. This fascial opening  was closed with two running 0 Vicryl sutures. 0.25% Marcaine was then injected into all port sites and all incisions were reapproximated at the skin level with 3-0 Monocryl subcuticular sutures. Dermabond was applied to the skin. The patient appeared to tolerate the procedure well and without complications. The patient was able to be extubated and transferred to the recovery unit in satisfactory condition.  Pryor Curia MD

## 2014-06-28 ENCOUNTER — Encounter (HOSPITAL_COMMUNITY): Payer: Self-pay | Admitting: Urology

## 2014-06-28 LAB — HEMOGLOBIN AND HEMATOCRIT, BLOOD
HCT: 39.5 % (ref 39.0–52.0)
Hemoglobin: 13.8 g/dL (ref 13.0–17.0)

## 2014-06-28 MED ORDER — ALUM & MAG HYDROXIDE-SIMETH 200-200-20 MG/5ML PO SUSP
15.0000 mL | Freq: Once | ORAL | Status: AC
  Administered 2014-06-28: 15 mL via ORAL
  Filled 2014-06-28: qty 30

## 2014-06-28 MED ORDER — HYDROCODONE-ACETAMINOPHEN 5-325 MG PO TABS: 1.0000 | ORAL_TABLET | Freq: Four times a day (QID) | ORAL | Status: DC | PRN

## 2014-06-28 MED ORDER — BISACODYL 10 MG RE SUPP
10.0000 mg | Freq: Once | RECTAL | Status: AC
  Administered 2014-06-28: 10 mg via RECTAL
  Filled 2014-06-28: qty 1

## 2014-06-28 NOTE — Progress Notes (Signed)
Patient ID: Hunter Jones, male   DOB: 03-23-1956, 59 y.o.   MRN: [id number]  1 Day Post-Op Subjective: The patient is doing well.  No nausea or vomiting. Pain is adequately controlled.  Objective: Vital signs in last 24 hours: Temp:  [97.3 F (36.3 C)-98.1 F (36.7 C)] 98 F (36.7 C) (01/15 0646) Pulse Rate:  [84-108] 91 (01/15 0646) Resp:  [11-18] 15 (01/15 0646) BP: (113-181)/(53-108) 113/61 mmHg (01/15 0646) SpO2:  [67 %-100 %] 98 % (01/15 0646) Weight:  [127.007 kg (280 lb)-130.182 kg (287 lb)] 127.007 kg (280 lb) (01/14 1640)  Intake/Output from previous day: 01/14 0701 - 01/15 0700 In: 4425 [P.O.:120; I.V.:3205; IV Piggyback:1100] Out: 6811 [Urine:2850; Drains:150; Blood:50] Intake/Output this shift:    Physical Exam:  General: Alert and oriented. CV: RRR Lungs: Clear bilaterally. GI: Soft, Nondistended. Incisions: Clean, dry, and intact Urine: Clear Extremities: Nontender, no erythema, no edema.  Lab Results:  Recent Labs  06/27/14 1505 06/28/14 0705  HGB 14.5 13.8  HCT 40.6 39.5      Assessment/Plan: POD# 1 s/p robotic prostatectomy.  1) SL IVF 2) Ambulate, Incentive spirometry 3) Transition to oral pain medication 4) Dulcolax suppository 5) D/C pelvic drain 6) Plan for likely discharge later today   Pryor Curia. MD   LOS: 1 day   Kla Bily,LES 06/28/2014, 7:38 AM

## 2014-06-28 NOTE — Discharge Summary (Signed)
Date of admission: 06/27/2014  Date of discharge: 06/28/2014  Admission diagnosis: Prostate Cancer  Discharge diagnosis: Prostate Cancer  History and Physical: For full details, please see admission history and physical. Briefly, Hunter Jones is a 59 y.o. gentleman with localized prostate cancer.  After discussing management/treatment options, he elected to proceed with surgical treatment.  Hospital Course: Hunter Jones was taken to the operating room on 06/27/2014 and underwent a robotic assisted laparoscopic radical prostatectomy. He tolerated this procedure well and without complications. Postoperatively, he was able to be transferred to a regular hospital room following recovery from anesthesia.  He was able to begin ambulating the night of surgery. He remained hemodynamically stable overnight.  He had excellent urine output with appropriately minimal output from his pelvic drain and his pelvic drain was removed on POD #1.  He was transitioned to oral pain medication, tolerated a clear liquid diet, and had met all discharge criteria and was able to be discharged home later on POD#1.  Laboratory values:  Recent Labs  06/27/14 1505 06/28/14 0705  HGB 14.5 13.8  HCT 40.6 39.5    Disposition: Home  Discharge instruction: He was instructed to be ambulatory but to refrain from heavy lifting, strenuous activity, or driving. He was instructed on urethral catheter care.  Discharge medications:     Medication List    STOP taking these medications        aspirin EC 81 MG tablet      TAKE these medications        albuterol 108 (90 BASE) MCG/ACT inhaler  Commonly known as:  PROVENTIL HFA;VENTOLIN HFA  Inhale 1-2 puffs into the lungs every 6 (six) hours as needed for wheezing or shortness of breath.     atorvastatin 40 MG tablet  Commonly known as:  LIPITOR  Take 40 mg by mouth at bedtime.     budesonide-formoterol 160-4.5 MCG/ACT inhaler  Commonly known as:  SYMBICORT  Inhale 2  puffs into the lungs 2 (two) times daily.     ciprofloxacin 500 MG tablet  Commonly known as:  CIPRO  Take 1 tablet (500 mg total) by mouth 2 (two) times daily. Start day prior to office visit for foley removal     HYDROcodone-acetaminophen 5-325 MG per tablet  Commonly known as:  NORCO  Take 1-2 tablets by mouth every 6 (six) hours as needed.     methimazole 10 MG tablet  Commonly known as:  TAPAZOLE  Take 10 mg by mouth daily.     metoprolol succinate 50 MG 24 hr tablet  Commonly known as:  TOPROL-XL  Take 50 mg by mouth every morning. Take with or immediately following a meal.        Followup: He will followup in 1 week for catheter removal and to discuss his surgical pathology results.

## 2015-01-23 ENCOUNTER — Ambulatory Visit: Payer: BLUE CROSS/BLUE SHIELD | Admitting: Radiation Oncology

## 2015-01-23 ENCOUNTER — Ambulatory Visit: Payer: BLUE CROSS/BLUE SHIELD

## 2015-04-02 ENCOUNTER — Other Ambulatory Visit: Payer: Self-pay | Admitting: Radiation Oncology

## 2015-04-02 DIAGNOSIS — N3040 Irradiation cystitis without hematuria: Secondary | ICD-10-CM

## 2015-04-02 DIAGNOSIS — C61 Malignant neoplasm of prostate: Secondary | ICD-10-CM

## 2015-04-02 MED ORDER — OXYBUTYNIN CHLORIDE ER 5 MG PO TB24: 5.0000 mg | ORAL_TABLET | Freq: Every day | ORAL | Status: DC

## 2016-08-06 ENCOUNTER — Encounter (HOSPITAL_COMMUNITY): Payer: BLUE CROSS/BLUE SHIELD | Attending: Oncology | Admitting: Oncology

## 2016-08-06 ENCOUNTER — Other Ambulatory Visit (HOSPITAL_COMMUNITY): Payer: BLUE CROSS/BLUE SHIELD

## 2016-08-06 ENCOUNTER — Encounter (HOSPITAL_COMMUNITY): Payer: Self-pay

## 2016-08-06 VITALS — BP 124/63 | HR 69 | Temp 97.8°F | Resp 18 | Ht 71.0 in | Wt 290.5 lb

## 2016-08-06 DIAGNOSIS — C61 Malignant neoplasm of prostate: Secondary | ICD-10-CM | POA: Diagnosis not present

## 2016-08-06 DIAGNOSIS — R9721 Rising PSA following treatment for malignant neoplasm of prostate: Secondary | ICD-10-CM | POA: Diagnosis not present

## 2016-08-06 NOTE — Patient Instructions (Addendum)
[facility] at [facility] Discharge Instructions  RECOMMENDATIONS MADE BY THE CONSULTANT AND ANY TEST RESULTS WILL BE SENT TO YOUR REFERRING PHYSICIAN.  You were seen today by Dr. Zhou. Return in 3 months for labs and follow up.  Thank you for choosing [facility] at [facility] to provide your oncology and hematology care.  To afford each patient quality time with our provider, please arrive at least 15 minutes before your scheduled appointment time.    If you have a lab appointment with the Cancer Center please come in thru the  Main Entrance and check in at the main information desk  You need to re-schedule your appointment should you arrive 10 or more minutes late.  We strive to give you quality time with our providers, and arriving late affects you and other patients whose appointments are after yours.  Also, if you no show three or more times for appointments you may be dismissed from the clinic at the providers discretion.     Again, thank you for choosing [facility].  Our hope is that these requests will decrease the amount of time that you wait before being seen by our physicians.       _____________________________________________________________  Should you have questions after your visit to [facility], please contact our office at [phone] between the hours of 8:30 a.m. and 4:30 p.m.  Voicemails left after 4:30 p.m. will not be returned until the following business day.  For prescription refill requests, have your pharmacy contact our office.       Resources For Cancer Patients and their Caregivers ? American Cancer Society: Can assist with transportation, wigs, general needs, runs Look Good Feel Better.        1-888-227-6333 ? Cancer Care: Provides financial assistance, online support groups, medication/co-pay assistance.  1-800-813-HOPE (4673) ? [facility] Assists  [place] cancer patients and their families through emotional , educational and financial support.  336-427-4357 ? [place] DSS Where to apply for food stamps, Medicaid and utility assistance. 336-342-1394 ? [facility]: Transportation to medical appointments. 336-347-2287 ? Social Security Administration: May apply for disability if have a Stage IV cancer. 336-342-7796 1-800-772-1213 ? [place] Aging, Disability and Transit Services: Assists with nutrition, care and transit needs. 336-349-2343  Cancer Center Support Programs: @10RELATIVEDAYS@ > Cancer Support Group  2nd Tuesday of the month 1pm-2pm, Journey Room  > Creative Journey  3rd Tuesday of the month 1130am-1pm, Journey Room  > Look Good Feel Better  1st Wednesday of the month 10am-12 noon, Journey Room (Call American Cancer Society to register 1-800-395-5775)    

## 2016-08-06 NOTE — Progress Notes (Signed)
[facility] NOTE  Patient Care Team: Monico Blitz, MD as PCP - General (Internal Medicine)  CHIEF COMPLAINTS/PURPOSE OF CONSULTATION:  Stage IIIB (pT3pN0) prostate cancer, s/p radical prostatectomy and radiation  No history exists.    HISTORY OF PRESENTING ILLNESS:  Hunter Jones 61 y.o. male is here because of referral from [facility] urologist Dr. Clyde Lundborg for history of prostate cancer and recently elevated PSA. Most recent PSA was 1.6 ng/ml.   The patient underwent robotic radical prostatectomy on 06/27/2014 with Dr. Alinda Money. Final pathology showed adenocarcinoma, pT3b, pN0, Gleason Score 4+3. Right and left prostate involved as well as right and left seminal vesicles involved by tumor, with extracapsular extension. The tumor extended into the bladder neck tissue. Three lymph from the left pelvis and four lymph nodes from the right pelvis were sampled and all were negative for malignancy.  The patient received radiation therapy post prostatectomy with Dr. Tammi Klippel.   The patient was previously placed on Lupron pre-surgery and received two 3 month doses however, both he and his wife could not tolerate it. Dr. Exie Parody spoke with him about the option of Lupron again at their most recent visit on 07/08/2016. The patient opted for active surveillance versus androgen deprivation therapy at that time.  Patient states he feels well today. He denies any bone pain. He is currently not on any therapy. He had an episode of hematuria a few weeks ago which he attributes to taking aspirin. He had a renal ultrasound and no evidence of nephrolithiasis.     MEDICAL HISTORY:  Past Medical History:  Diagnosis Date  . COPD (chronic obstructive pulmonary disease) ([place])   . Coronary artery disease   . Hypertension   . Hyperthyroidism   . Myocardial infarction 2014   followed by Dr. Hamilton Capri / cardiologist - Linna Hoff  . Prostate cancer ([place])   . Shortness of breath dyspnea    with exertion    SURGICAL HISTORY: Past Surgical History:  Procedure Laterality Date  . APPENDECTOMY    . LYMPHADENECTOMY Bilateral 06/27/2014   Procedure: LYMPHADENECTOMY;  Surgeon: Raynelle Bring, MD;  Location: [facility];  Service: Urology;  Laterality: Bilateral;  . ROBOT ASSISTED LAPAROSCOPIC RADICAL PROSTATECTOMY N/A 06/27/2014   Procedure: ROBOTIC ASSISTED LAPAROSCOPIC RADICAL PROSTATECTOMY LEVEL 3;  Surgeon: Raynelle Bring, MD;  Location: [facility];  Service: Urology;  Laterality: N/A;  . stented coronary artery  01/2013   2 stents  . TONSILLECTOMY      SOCIAL HISTORY: Social History   Social History  . Marital status: Married    Spouse name: N/A  . Number of children: N/A  . Years of education: N/A   Occupational History  . Not on file.   Social History Main Topics  . Smoking status: Former Smoker    Years: 40.00    Types: Cigarettes    Quit date: 06/21/2012  . Smokeless tobacco: Never Used  . Alcohol use Yes     Comment: occasional  . Drug use: No  . Sexual activity: Not on file   Other Topics Concern  . Not on file   Social History Narrative  . No narrative on file    FAMILY HISTORY: Family History  Problem Relation Age of Onset  . Dementia Mother   . Thyroid disease Mother   . Kidney Stones Father   . Stroke Brother     ALLERGIES:  is allergic to penicillins.  MEDICATIONS:  Current Outpatient Prescriptions  Medication Sig Dispense Refill  .  atorvastatin (LIPITOR) 40 MG tablet Take 40 mg by mouth at bedtime.    [name] Kitchen co-enzyme Q-10 30 MG capsule Take 30 mg by mouth 3 (three) times daily.    . fluticasone-salmeterol (ADVAIR HFA) 115-21 MCG/ACT inhaler Inhale 2 puffs into the lungs.    [name] Kitchen KRILL OIL PO Take by mouth.    . losartan (COZAAR) 25 MG tablet Take 25 mg by mouth daily.    . methimazole (TAPAZOLE) 10 MG tablet Take 10 mg by mouth daily.    . metoprolol succinate (TOPROL-XL) 50 MG 24 hr tablet Take 50 mg by mouth every morning. Take with or immediately  following a meal.    . Multiple Vitamin (MULTIVITAMIN) tablet Take 1 tablet by mouth daily.    [name] Kitchen oxybutynin (DITROPAN XL) 5 MG 24 hr tablet Take 1 tablet (5 mg total) by mouth at bedtime. 30 tablet 2  . albuterol (PROVENTIL HFA;VENTOLIN HFA) 108 (90 BASE) MCG/ACT inhaler Inhale 1-2 puffs into the lungs every 6 (six) hours as needed for wheezing or shortness of breath.     No current facility-administered medications for this visit.     Review of Systems  Constitutional: Negative.   HENT: Negative.   Eyes: Negative.   Respiratory: Negative.   Cardiovascular: Negative.   Gastrointestinal: Negative.   Genitourinary: Positive for hematuria.       One episode hematuria attributes to aspirin intake  Musculoskeletal: Negative.   Skin: Negative.   Neurological: Negative.   Endo/Heme/Allergies: Negative.   Psychiatric/Behavioral: Negative.   All other systems reviewed and are negative.  14 point ROS was done and is otherwise as detailed above or in HPI   PHYSICAL EXAMINATION: ECOG PERFORMANCE STATUS: 0 - Asymptomatic  Vitals:   08/06/16 0848  BP: 124/63  Pulse: 69  Resp: 18  Temp: 97.8 F (36.6 C)   Filed Weights   08/06/16 0848  Weight: 290 lb 8 oz (131.8 kg)     Physical Exam  Constitutional: He is oriented to person, place, and time and well-developed, well-nourished, and in no distress.  HENT:  Head: Normocephalic and atraumatic.  Mouth/Throat: Oropharynx is clear and moist.  Eyes: Conjunctivae and EOM are normal. Pupils are equal, round, and reactive to light.  Neck: Normal range of motion. Neck supple.  Cardiovascular: Normal rate, regular rhythm and normal heart sounds.   Pulmonary/Chest: Effort normal and breath sounds normal.  Abdominal: Soft. Bowel sounds are normal.  Musculoskeletal: Normal range of motion.  Neurological: He is alert and oriented to person, place, and time. Gait normal.  Skin: Skin is warm and dry.  Nursing note and vitals  reviewed.   LABORATORY DATA:  I have reviewed the data as listed Lab Results  Component Value Date   WBC 8.9 06/21/2014   HGB 13.8 06/28/2014   HCT 39.5 06/28/2014   MCV 96.8 06/21/2014   PLT 157 06/21/2014   CMP     Component Value Date/Time   NA 142 06/21/2014 1010   K 4.7 06/21/2014 1010   CL 107 06/21/2014 1010   CO2 27 06/21/2014 1010   GLUCOSE 95 06/21/2014 1010   BUN 22 06/21/2014 1010   CREATININE 0.95 06/21/2014 1010   CALCIUM 9.5 06/21/2014 1010   GFRNONAA 89 (L) 06/21/2014 1010   GFRAA >90 06/21/2014 1010   PSA 01/2014: 9.6 ng/ml PNBX 01/2014 Gleason 4+4 prostate cancer 03/2015: 0.23 ng/ml pre radiation 06/2015: <0.1 ng/ml  10/2015: <0.1 ng/ml  03/2016: 0.8 ng/ml  06/2016: 1.6 ng/ml   PATHOLOGY  Diagnosis 06/27/2014 1. Prostate, radical resection - PROSTATIC ADENOCARCINOMA, GLEASON SCORE 4 + 3 = 7. - RIGHT AND LEFT PROSTATE INVOLVED. - RIGHT AND LEFT SEMINAL VESICLES INVOLVED BY TUMOR. - EXTRACAPSULAR EXTENSION BY TUMOR. - TUMOR EXTENDS INTO BLADDER NECK TISSUE. - MARGINS NOT INVOLVED. 2. Lymph nodes, regional resection, left pelvic - THREE BENIGN LYMPH NODES (0/3). 3. Lymph nodes, regional resection, right pelvic - FOUR BENIGN LYMPH NODES (0/4). Microscopic Comment 1. PROSTATE RADICAL RESECTION/PROSTATECTOMY Procedure: Prostatectomy. Prostate gland: Weight: 40 grams. Dimension: 4.2 x 3.7 x 3.7 cm. Histologic type: Prostatic adenocarcinoma. Gleasons Score: 4 (90%) + 3 (10%) = 7. Tertiary score (if applicable): N/A. Involving (half lobe or less, one or both lobes): Both lobes, greater left lobe involvement. Estimated proportion (%) of prostate tissue involved by tumor: 30%. Extraprostatic extension: Present on left. Involvement of apex: Yes, left. Margins: Free of tumor. Seminal vesicles: Right and left seminal vesicles involved by tumor. 1 of 3 Amended copy Amended FINAL for Raulston, Coda L ([id number]) Microscopic  Comment(continued) Lymph-Vascular invasion: Not identified. Treatment effect: No. Lymph nodes: number examined 7 ; number positive 0. TNM code: pT3b, pN0. Comments: There is extensive perineural involvement by tumor. (JDP:ds 07/01/14)  RADIOGRAPHIC STUDIES: I have personally reviewed the radiological images as listed and agreed with the findings in the report.  ASSESSMENT & PLAN:  Stage IIIB (pT3pN0) prostate cancer, Gleason 4+3=7 s/p radical prostatectomy and radiation. Patient had a rise in his PSA from 03/2016: 0.8 ng/ml  To 06/2016: 1.6 ng/ml; asymptomatic.  PLAN:  Patient has been having a slow rise in his PSA and is currently asymptomatic. He has a history of intolerance to androgen deprivation therapy with a lot of emotional side effects and wishes to continue observation at this time.   We will see him for follow up in 3 months with labs including,PSA CBC CMP and testosterone level. Should he have a progressive rise in PSA on his next visit or develop new symptoms, we will then perform restaging scans including CT C/A/P and bone scan.     MEDICATIONS PRESCRIBED THIS ENCOUNTER: Meds ordered this encounter  Medications  . losartan (COZAAR) 25 MG tablet    Sig: Take 25 mg by mouth daily.  . fluticasone-salmeterol (ADVAIR HFA) 115-21 MCG/ACT inhaler    Sig: Inhale 2 puffs into the lungs.  [name] Kitchen co-enzyme Q-10 30 MG capsule    Sig: Take 30 mg by mouth 3 (three) times daily.  [name] Kitchen KRILL OIL PO    Sig: Take by mouth.  . Multiple Vitamin (MULTIVITAMIN) tablet    Sig: Take 1 tablet by mouth daily.     All questions were answered. The patient knows to call the clinic with any problems, questions or concerns.  I spent 40 minutes counseling the patient face to face. The total time spent in the appointment was 55 minutes and more than 50% was on counseling and review of test results  This document serves as a record of services personally performed by [name], MD. It was created on  her behalf by Arlyce Harman, a trained medical scribe. The creation of this record is based on the scribe's personal observations and the provider's statements to them. This document has been checked and approved by the attending provider.  I have reviewed the above documentation for accuracy and completeness and I agree with the above.  This note was electronically signed.    Carlis Abbott  08/06/2016 8:55 AM

## 2016-11-09 ENCOUNTER — Other Ambulatory Visit (HOSPITAL_COMMUNITY): Payer: BLUE CROSS/BLUE SHIELD

## 2016-11-09 ENCOUNTER — Encounter (HOSPITAL_COMMUNITY): Payer: Self-pay | Admitting: Oncology

## 2016-11-09 ENCOUNTER — Encounter (HOSPITAL_BASED_OUTPATIENT_CLINIC_OR_DEPARTMENT_OTHER): Payer: BLUE CROSS/BLUE SHIELD | Admitting: Oncology

## 2016-11-09 ENCOUNTER — Encounter (HOSPITAL_COMMUNITY): Payer: Self-pay | Admitting: Lab

## 2016-11-09 ENCOUNTER — Encounter (HOSPITAL_COMMUNITY): Payer: BLUE CROSS/BLUE SHIELD | Attending: Oncology

## 2016-11-09 ENCOUNTER — Encounter (INDEPENDENT_AMBULATORY_CARE_PROVIDER_SITE_OTHER): Payer: Self-pay

## 2016-11-09 ENCOUNTER — Ambulatory Visit (HOSPITAL_COMMUNITY): Payer: BLUE CROSS/BLUE SHIELD | Admitting: Oncology

## 2016-11-09 VITALS — BP 146/70 | HR 96 | Temp 98.3°F | Resp 16 | Ht 71.0 in | Wt 298.0 lb

## 2016-11-09 DIAGNOSIS — E0590 Thyrotoxicosis, unspecified without thyrotoxic crisis or storm: Secondary | ICD-10-CM | POA: Diagnosis not present

## 2016-11-09 DIAGNOSIS — R600 Localized edema: Secondary | ICD-10-CM

## 2016-11-09 DIAGNOSIS — R310 Gross hematuria: Secondary | ICD-10-CM | POA: Diagnosis not present

## 2016-11-09 DIAGNOSIS — I2510 Atherosclerotic heart disease of native coronary artery without angina pectoris: Secondary | ICD-10-CM | POA: Diagnosis not present

## 2016-11-09 DIAGNOSIS — I252 Old myocardial infarction: Secondary | ICD-10-CM | POA: Insufficient documentation

## 2016-11-09 DIAGNOSIS — R319 Hematuria, unspecified: Secondary | ICD-10-CM | POA: Diagnosis present

## 2016-11-09 DIAGNOSIS — C61 Malignant neoplasm of prostate: Secondary | ICD-10-CM | POA: Insufficient documentation

## 2016-11-09 DIAGNOSIS — Z87891 Personal history of nicotine dependence: Secondary | ICD-10-CM | POA: Diagnosis not present

## 2016-11-09 DIAGNOSIS — J449 Chronic obstructive pulmonary disease, unspecified: Secondary | ICD-10-CM | POA: Diagnosis not present

## 2016-11-09 DIAGNOSIS — I10 Essential (primary) hypertension: Secondary | ICD-10-CM | POA: Diagnosis not present

## 2016-11-09 DIAGNOSIS — M7989 Other specified soft tissue disorders: Secondary | ICD-10-CM | POA: Diagnosis not present

## 2016-11-09 LAB — URINALYSIS, ROUTINE W REFLEX MICROSCOPIC
Bilirubin Urine: NEGATIVE
Glucose, UA: 50 mg/dL — AB
Hgb urine dipstick: NEGATIVE
KETONES UR: NEGATIVE mg/dL
LEUKOCYTES UA: NEGATIVE
NITRITE: NEGATIVE
PH: 5 (ref 5.0–8.0)
PROTEIN: NEGATIVE mg/dL
Specific Gravity, Urine: 1.024 (ref 1.005–1.030)

## 2016-11-09 LAB — COMPREHENSIVE METABOLIC PANEL
ALBUMIN: 3.7 g/dL (ref 3.5–5.0)
ALT: 35 U/L (ref 17–63)
AST: 31 U/L (ref 15–41)
Alkaline Phosphatase: 127 U/L — ABNORMAL HIGH (ref 38–126)
Anion gap: 9 (ref 5–15)
BUN: 26 mg/dL — ABNORMAL HIGH (ref 6–20)
CO2: 26 mmol/L (ref 22–32)
Calcium: 9 mg/dL (ref 8.9–10.3)
Chloride: 106 mmol/L (ref 101–111)
Creatinine, Ser: 1.18 mg/dL (ref 0.61–1.24)
GFR calc Af Amer: >60 mL/min (ref >60)
GFR calc non Af Amer: >60 mL/min (ref >60)
Glucose, Bld: 151 mg/dL — ABNORMAL HIGH (ref 65–99)
POTASSIUM: 4.6 mmol/L (ref 3.5–5.1)
SODIUM: 141 mmol/L (ref 135–145)
Total Bilirubin: 0.7 mg/dL (ref 0.3–1.2)
Total Protein: 6.5 g/dL (ref 6.5–8.1)

## 2016-11-09 LAB — CBC WITH DIFFERENTIAL/PLATELET
BASOS PCT: 0 %
Basophils Absolute: 0 10*3/uL (ref 0.0–0.1)
EOS ABS: 0.4 10*3/uL (ref 0.0–0.7)
EOS PCT: 5 %
HEMATOCRIT: 42.7 % (ref 39.0–52.0)
Hemoglobin: 14.9 g/dL (ref 13.0–17.0)
Lymphocytes Relative: 37 %
Lymphs Abs: 2.9 10*3/uL (ref 0.7–4.0)
MCH: 34.5 pg — ABNORMAL HIGH (ref 26.0–34.0)
MCHC: 34.9 g/dL (ref 30.0–36.0)
MCV: 98.8 fL (ref 78.0–100.0)
MONO ABS: 0.5 10*3/uL (ref 0.1–1.0)
Monocytes Relative: 7 %
NEUTROS ABS: 4.1 10*3/uL (ref 1.7–7.7)
Neutrophils Relative %: 51 %
PLATELETS: 124 10*3/uL — AB (ref 150–400)
RBC: 4.32 MIL/uL (ref 4.22–5.81)
RDW: 13 % (ref 11.5–15.5)
WBC: 7.9 10*3/uL (ref 4.0–10.5)

## 2016-11-09 LAB — PSA: PSA: 3.68 ng/mL (ref 0.00–4.00)

## 2016-11-09 MED ORDER — FUROSEMIDE 20 MG PO TABS: 20.0000 mg | ORAL_TABLET | Freq: Every day | ORAL | 0 refills | Status: DC

## 2016-11-09 NOTE — Patient Instructions (Addendum)
[place] at [facility] Discharge Instructions  RECOMMENDATIONS MADE BY THE CONSULTANT AND ANY TEST RESULTS WILL BE SENT TO YOUR REFERRING PHYSICIAN.  You were seen today by Kirby Crigler PA-C. Rx given today for Lasix for leg swelling. UA and culture sent to lab today. Return in 3 months for labs and follow up.   Thank you for choosing [place] at [facility] to provide your oncology and hematology care.  To afford each patient quality time with our provider, please arrive at least 15 minutes before your scheduled appointment time.    If you have a lab appointment with the [facility] [place] please come in thru the  Main Entrance and check in at the main information desk  You need to re-schedule your appointment should you arrive 10 or more minutes late.  We strive to give you quality time with our providers, and arriving late affects you and other patients whose appointments are after yours.  Also, if you no show three or more times for appointments you may be dismissed from the clinic at the providers discretion.     Again, thank you for choosing [facility].  Our hope is that these requests will decrease the amount of time that you wait before being seen by our physicians.       _____________________________________________________________  Should you have questions after your visit to [facility], please contact our office at [phone] between the hours of 8:30 a.m. and 4:30 p.m.  Voicemails left after 4:30 p.m. will not be returned until the following business day.  For prescription refill requests, have your pharmacy contact our office.       Resources For Cancer Patients and their Caregivers ? American Cancer Society: Can assist with transportation, wigs, general needs, runs Look Good Feel Better.        ([phone] [phone] ? Cancer Care: Provides financial assistance, online support groups, medication/co-pay  assistance.  1-800-813-HOPE (248)832-7598) ? [place] Assists [place] cancer patients and their families through emotional , educational and financial support.  (587)465-1265 ? [place] DSS Where to apply for food stamps, Medicaid and utility assistance. 417-412-8545 ? RCATS: Transportation to medical appointments. (304)531-1599 ? Social Security Administration: May apply for disability if have a Stage IV cancer. 319-385-2606 (743)888-5172 ? [employer], Disability and Transit Services: Assists with nutrition, care and transit needs. [place] Support Programs: @10RELATIVEDAYS @ > Cancer Support Group  2nd Tuesday of the month 1pm-2pm, Journey Room  > Creative Journey  3rd Tuesday of the month 1130am-1pm, Journey Room  > Look Good Feel Better  1st Wednesday of the month 10am-12 noon, Journey Room (Call [place] to register [phone])

## 2016-11-09 NOTE — Progress Notes (Signed)
Hunter Jones, [place] [address]  Prostate cancer [facility]) - Plan: CBC with Differential, Comprehensive metabolic panel, PSA  Hematuria, unspecified type - Plan: Urinalysis, Routine w reflex microscopic, Urine culture, Urinalysis, Routine w reflex microscopic, Urine culture  Bilateral leg edema - Plan: furosemide (LASIX) 20 MG tablet  CURRENT THERAPY: Observation for rising PSA  INTERVAL HISTORY: Hunter Jones 61 y.o. male returns for followup of Stage IIIB (pT3pN0) prostate cancer, Gleason 4+3=7.  S/P prostatectomy by Dr. Alinda Money on 06/27/2014 and radiation.  Initial biopsy on 02/06/2014 by Dr. Clyde Lundborg shows a prostate adenocarcinoma in 5/6 cores.  HPI Elements   Location: Prostate  Quality: Adenocarcinoma  Severity: Stage IIIB  Duration: Dx on 02/06/2014  Context:   Timing:   Modifying Factors: S/P XRT following prostetectomy  Associated Signs & Symptoms: No with rising PSA   He reports LE edema.  This has been ongoing for weeks without improvement he denies improvement in edema in AM with progression in PM.  He denies any pain.  He reports "tightness" of his legs.  He recently saw his cardiologist, but this was not addressed at this visit.  He is very anxious about his PSA.  He has not seen urology since departure of Dr. Exie Parody from [facility].  He reports a distant history of gross hematuria.  He denies any recently.  He notes some issues with starting his stream and he relays that this was a presenting symptom with his primary prostate cancer diagnosis.  He is not anuric or oliguria.  Review of Systems  Constitutional: Negative.  Negative for chills, fever and weight loss.  HENT: Negative.   Eyes: Negative.   Respiratory: Negative.  Negative for cough.   Cardiovascular: Negative.  Negative for chest pain.  Gastrointestinal: Negative.  Negative for blood in stool, constipation, diarrhea, melena, nausea and vomiting.  Genitourinary:   Difficulty starting stream  Musculoskeletal: Negative.   Skin: Negative.   Neurological: Negative.  Negative for weakness.  Endo/Heme/Allergies: Negative.   Psychiatric/Behavioral: Negative.     Past Medical History:  Diagnosis Date  . COPD (chronic obstructive pulmonary disease) ([place])   . Coronary artery disease   . Hypertension   . Hyperthyroidism   . Myocardial infarction [facility]) 2014   followed by Dr. Hamilton Capri / cardiologist - Linna Hoff  . Prostate cancer ([place])   . Prostate cancer ([place]) 06/27/2014  . Shortness of breath dyspnea    with exertion    Past Surgical History:  Procedure Laterality Date  . APPENDECTOMY    . LYMPHADENECTOMY Bilateral 06/27/2014   Procedure: LYMPHADENECTOMY;  Surgeon: Raynelle Bring, MD;  Location: [facility] ORS;  Service: Urology;  Laterality: Bilateral;  . ROBOT ASSISTED LAPAROSCOPIC RADICAL PROSTATECTOMY N/A 06/27/2014   Procedure: ROBOTIC ASSISTED LAPAROSCOPIC RADICAL PROSTATECTOMY LEVEL 3;  Surgeon: Raynelle Bring, MD;  Location: [facility] ORS;  Service: Urology;  Laterality: N/A;  . stented coronary artery  01/2013   2 stents  . TONSILLECTOMY      Family History  Problem Relation Age of Onset  . Dementia Mother   . Thyroid disease Mother   . Kidney Stones Father   . Stroke Brother     Social History   Social History  . Marital status: Married    Spouse name: N/A  . Number of children: N/A  . Years of education: N/A   Social History Main Topics  . Smoking status: Former Smoker    Years: 40.00  Types: Cigarettes    Quit date: 06/21/2012  . Smokeless tobacco: Never Used  . Alcohol use Yes     Comment: occasional  . Drug use: No  . Sexual activity: Not on file   Other Topics Concern  . Not on file   Social History Narrative  . No narrative on file     PHYSICAL EXAMINATION  ECOG PERFORMANCE STATUS: 0 - Asymptomatic  Vitals:   11/09/16 1405  BP: (!) 146/70  Pulse: 96  Resp: 16  Temp: 98.3 F (36.8 C)    GENERAL:alert, no  distress, well nourished, well developed, comfortable, cooperative, obese, smiling and accompanied by wife. SKIN: skin color, texture, turgor are normal, no rashes or significant lesions HEAD: Normocephalic, No masses, lesions, tenderness or abnormalities EYES: normal, EOMI, Conjunctiva are pink and non-injected EARS: External ears normal OROPHARYNX:lips, buccal mucosa, and tongue normal and mucous membranes are moist  NECK: supple, trachea midline LYMPH:  no palpable lymphadenopathy BREAST:not examined LUNGS: clear to auscultation  HEART: regular rate & rhythm, no murmurs, no gallops, S1 normal and S2 normal ABDOMEN:abdomen soft, non-tender, obese and normal bowel sounds BACK: Back symmetric, no curvature. EXTREMITIES:less then 2 second capillary refill, no joint deformities, effusion, or inflammation, no skin discoloration, no cyanosis, positive findings:  edema B/L 1 + pitting edema of LE B/L.  NEURO: alert & oriented x 3 with fluent speech, no focal motor/sensory deficits, gait normal   LABORATORY DATA: CBC    Component Value Date/Time   WBC 7.9 11/09/2016 1319   RBC 4.32 11/09/2016 1319   HGB 14.9 11/09/2016 1319   HCT 42.7 11/09/2016 1319   PLT 124 (L) 11/09/2016 1319   MCV 98.8 11/09/2016 1319   MCH 34.5 (H) 11/09/2016 1319   MCHC 34.9 11/09/2016 1319   RDW 13.0 11/09/2016 1319   LYMPHSABS 2.9 11/09/2016 1319   MONOABS 0.5 11/09/2016 1319   EOSABS 0.4 11/09/2016 1319   BASOSABS 0.0 11/09/2016 1319      Chemistry      Component Value Date/Time   NA 141 11/09/2016 1319   K 4.6 11/09/2016 1319   CL 106 11/09/2016 1319   CO2 26 11/09/2016 1319   BUN 26 (H) 11/09/2016 1319   CREATININE 1.18 11/09/2016 1319      Component Value Date/Time   CALCIUM 9.0 11/09/2016 1319   ALKPHOS 127 (H) 11/09/2016 1319   AST 31 11/09/2016 1319   ALT 35 11/09/2016 1319   BILITOT 0.7 11/09/2016 1319     No results found for: PSA .No results found for: TESTOSTERONE    PENDING  LABS:   RADIOGRAPHIC STUDIES:  No results found.   PATHOLOGY:    ASSESSMENT AND PLAN:  Prostate cancer ([place]) Stage IIIB (pT3pN0) prostate cancer, Gleason 4+3=7.  S/P prostatectomy by Dr. Alinda Money on 06/27/2014 and radiation.  Initial biopsy on 02/06/2014 by Dr. Clyde Lundborg shows a prostate adenocarcinoma in 5/6 cores.  Now with slowly rising PSA- 03/2016: 0.8 ng/ml  To 06/2016: 1.6 ng/ml.  He remains asymptomatic.  Labs today: CBC diff, CMET, testosterone, PSA.  I personally reviewed and went over laboratory results with the patient.  The results are noted within this dictation.  Labs in 3 months: CBC diff, CMET, PSA.  PSA is pending at this time.  If elevated, will then pursue CT CAP and bone scan to evaluate for recurrence of disease.  If stable or improved, will continue to follow closely.  I have ordered a UA with reflex and culture due to  his history of gross hematuria in Jan 2018.  Rx for Lasix 20 mg PO x 10 days is provided for his LE edema.  I have recommended LE elevation when resting.  If persistent, imaging would be indicated to rule out pelvic lymphadenopathy.  Return in 3 months, sooner if indicated based upon PSA result.  Patient would like to re-establish his urology care locally.  Referral to [facility] in [place], [place] is placed.    ORDERS PLACED FOR THIS ENCOUNTER: Orders Placed This Encounter  Procedures  . Urine culture  . CBC with Differential  . Comprehensive metabolic panel  . PSA  . Urinalysis, Routine w reflex microscopic    MEDICATIONS PRESCRIBED THIS ENCOUNTER: Meds ordered this encounter  Medications  . furosemide (LASIX) 20 MG tablet    Sig: Take 1 tablet (20 mg total) by mouth daily.    Dispense:  10 tablet    Refill:  0    Order Specific Question:   Supervising Provider    Answer:   Brunetta Genera [[id number]]    THERAPY PLAN:  Continue to monitor PSA.  If rise is noted (doubling 6 months or less) then imaging will be  indicated.  All questions were answered. The patient knows to call the clinic with any problems, questions or concerns. We can certainly see the patient much sooner if necessary.  Patient and plan discussed with Dr. [name] and she is in agreement with the aforementioned.   This note is electronically signed by: Doy Mince 11/09/2016 5:29 PM

## 2016-11-09 NOTE — Progress Notes (Unsigned)
Referral sent to [facility].  Records faxed on 5/29

## 2016-11-09 NOTE — Assessment & Plan Note (Signed)
Stage IIIB (pT3pN0) prostate cancer, Gleason 4+3=7.  S/P prostatectomy by Dr. Alinda Money on 06/27/2014 and radiation.  Initial biopsy on 02/06/2014 by Dr. Clyde Lundborg shows a prostate adenocarcinoma in 5/6 cores.  Now with slowly rising PSA- 03/2016: 0.8 ng/ml  To 06/2016: 1.6 ng/ml.  He remains asymptomatic.  Labs today: CBC diff, CMET, testosterone, PSA.  I personally reviewed and went over laboratory results with the patient.  The results are noted within this dictation.  Labs in 3 months: CBC diff, CMET, PSA.  PSA is pending at this time.  If elevated, will then pursue CT CAP and bone scan to evaluate for recurrence of disease.  If stable or improved, will continue to follow closely.  I have ordered a UA with reflex and culture due to his history of gross hematuria in Jan 2018.  Rx for Lasix 20 mg PO x 10 days is provided for his LE edema.  I have recommended LE elevation when resting.  If persistent, imaging would be indicated to rule out pelvic lymphadenopathy.  Return in 3 months, sooner if indicated based upon PSA result.  Patient would like to re-establish his urology care locally.  Referral to [facility] in [place], [place] is placed.

## 2016-11-10 ENCOUNTER — Other Ambulatory Visit (HOSPITAL_COMMUNITY): Payer: Self-pay | Admitting: Oncology

## 2016-11-10 DIAGNOSIS — C61 Malignant neoplasm of prostate: Secondary | ICD-10-CM

## 2016-11-10 LAB — TESTOSTERONE: Testosterone: 257 ng/dL — ABNORMAL LOW (ref 264–916)

## 2016-11-11 LAB — URINE CULTURE: CULTURE: NO GROWTH

## 2016-11-16 ENCOUNTER — Encounter (HOSPITAL_COMMUNITY)
Admission: RE | Admit: 2016-11-16 | Discharge: 2016-11-16 | Disposition: A | Discharge status: Home / Self Care | Payer: BLUE CROSS/BLUE SHIELD | Source: Ambulatory Visit | Attending: Oncology | Admitting: Oncology

## 2016-11-16 ENCOUNTER — Encounter (HOSPITAL_COMMUNITY): Payer: Self-pay

## 2016-11-16 DIAGNOSIS — C61 Malignant neoplasm of prostate: Secondary | ICD-10-CM | POA: Insufficient documentation

## 2016-11-16 MED ORDER — TECHNETIUM TC 99M MEDRONATE IV KIT
20.0000 | PACK | Freq: Once | INTRAVENOUS | Status: AC | PRN
  Administered 2016-11-16: 22 via INTRAVENOUS

## 2016-11-24 ENCOUNTER — Ambulatory Visit (HOSPITAL_COMMUNITY)
Admission: RE | Admit: 2016-11-24 | Discharge: 2016-11-24 | Disposition: A | Discharge status: Home / Self Care | Payer: BLUE CROSS/BLUE SHIELD | Source: Ambulatory Visit | Attending: Oncology | Admitting: Oncology

## 2016-11-24 ENCOUNTER — Encounter (HOSPITAL_COMMUNITY): Payer: Self-pay

## 2016-11-24 DIAGNOSIS — C61 Malignant neoplasm of prostate: Secondary | ICD-10-CM | POA: Diagnosis not present

## 2016-11-24 DIAGNOSIS — I700 Atherosclerosis of aorta: Secondary | ICD-10-CM | POA: Insufficient documentation

## 2016-11-24 DIAGNOSIS — I2510 Atherosclerotic heart disease of native coronary artery without angina pectoris: Secondary | ICD-10-CM | POA: Insufficient documentation

## 2016-11-24 DIAGNOSIS — Z9079 Acquired absence of other genital organ(s): Secondary | ICD-10-CM | POA: Insufficient documentation

## 2016-11-24 DIAGNOSIS — M899 Disorder of bone, unspecified: Secondary | ICD-10-CM | POA: Insufficient documentation

## 2016-11-24 DIAGNOSIS — K760 Fatty (change of) liver, not elsewhere classified: Secondary | ICD-10-CM | POA: Diagnosis not present

## 2016-11-24 MED ORDER — IOPAMIDOL (ISOVUE-300) INJECTION 61%
100.0000 mL | Freq: Once | INTRAVENOUS | Status: AC | PRN
  Administered 2016-11-24: 100 mL via INTRAVENOUS

## 2016-11-26 ENCOUNTER — Encounter (HOSPITAL_COMMUNITY): Payer: BLUE CROSS/BLUE SHIELD | Attending: Oncology | Admitting: Hematology

## 2016-11-26 ENCOUNTER — Encounter (HOSPITAL_COMMUNITY): Payer: Self-pay | Admitting: Hematology

## 2016-11-26 ENCOUNTER — Ambulatory Visit (HOSPITAL_COMMUNITY): Payer: BLUE CROSS/BLUE SHIELD | Admitting: Adult Health

## 2016-11-26 VITALS — BP 101/58 | HR 87 | Resp 18 | Ht 71.0 in | Wt 292.5 lb

## 2016-11-26 DIAGNOSIS — R319 Hematuria, unspecified: Secondary | ICD-10-CM

## 2016-11-26 DIAGNOSIS — I2510 Atherosclerotic heart disease of native coronary artery without angina pectoris: Secondary | ICD-10-CM | POA: Insufficient documentation

## 2016-11-26 DIAGNOSIS — Z87891 Personal history of nicotine dependence: Secondary | ICD-10-CM | POA: Insufficient documentation

## 2016-11-26 DIAGNOSIS — C7951 Secondary malignant neoplasm of bone: Secondary | ICD-10-CM | POA: Diagnosis not present

## 2016-11-26 DIAGNOSIS — M7989 Other specified soft tissue disorders: Secondary | ICD-10-CM | POA: Insufficient documentation

## 2016-11-26 DIAGNOSIS — C61 Malignant neoplasm of prostate: Secondary | ICD-10-CM

## 2016-11-26 DIAGNOSIS — J449 Chronic obstructive pulmonary disease, unspecified: Secondary | ICD-10-CM | POA: Insufficient documentation

## 2016-11-26 DIAGNOSIS — R310 Gross hematuria: Secondary | ICD-10-CM | POA: Insufficient documentation

## 2016-11-26 DIAGNOSIS — I252 Old myocardial infarction: Secondary | ICD-10-CM | POA: Insufficient documentation

## 2016-11-26 DIAGNOSIS — E0590 Thyrotoxicosis, unspecified without thyrotoxic crisis or storm: Secondary | ICD-10-CM | POA: Insufficient documentation

## 2016-11-26 DIAGNOSIS — I10 Essential (primary) hypertension: Secondary | ICD-10-CM | POA: Insufficient documentation

## 2016-11-26 NOTE — Patient Instructions (Addendum)
[place] at [facility] Discharge Instructions  RECOMMENDATIONS MADE BY THE CONSULTANT AND ANY TEST RESULTS WILL BE SENT TO YOUR REFERRING PHYSICIAN.  You were seen today by Dr. Irene Limbo. Start taking Lupron injection in 1 week.  Return in 3 weeks for follow up.    Thank you for choosing [place] at [facility] to provide your oncology and hematology care.  To afford each patient quality time with our provider, please arrive at least 15 minutes before your scheduled appointment time.    If you have a lab appointment with the [place] please come in thru the  Main Entrance and check in at the main information desk  You need to re-schedule your appointment should you arrive 10 or more minutes late.  We strive to give you quality time with our providers, and arriving late affects you and other patients whose appointments are after yours.  Also, if you no show three or more times for appointments you may be dismissed from the clinic at the providers discretion.     Again, thank you for choosing [facility].  Our hope is that these requests will decrease the amount of time that you wait before being seen by our physicians.       _____________________________________________________________  Should you have questions after your visit to [facility], please contact our office at [phone] between the hours of 8:30 a.m. and 4:30 p.m.  Voicemails left after 4:30 p.m. will not be returned until the following business day.  For prescription refill requests, have your pharmacy contact our office.       Resources For Cancer Patients and their Caregivers ? American Cancer Society: Can assist with transportation, wigs, general needs, runs Look Good Feel Better.        ([phone] ? Cancer Care: Provides financial assistance, online support groups, medication/co-pay assistance.  1-800-813-HOPE 660-460-8577) ? [place] Assists [place] cancer patients and their families through emotional , educational and financial support.  561-034-7346 ? [place] DSS Where to apply for food stamps, Medicaid and utility assistance. (867)759-9063 ? RCATS: Transportation to medical appointments. 250-208-4974 ? Social Security Administration: May apply for disability if have a Stage IV cancer. 416-133-2124 563-398-0296 ? [employer], Disability and Transit Services: Assists with nutrition, care and transit needs. [place] Support Programs: @10RELATIVEDAYS @ > Cancer Support Group  2nd Tuesday of the month 1pm-2pm, Journey Room  > Creative Journey  3rd Tuesday of the month 1130am-1pm, Journey Room  > Look Good Feel Better  1st Wednesday of the month 10am-12 noon, Journey Room (Call [place] to register [phone])

## 2016-12-06 ENCOUNTER — Encounter (HOSPITAL_BASED_OUTPATIENT_CLINIC_OR_DEPARTMENT_OTHER): Payer: BLUE CROSS/BLUE SHIELD

## 2016-12-06 ENCOUNTER — Encounter (HOSPITAL_COMMUNITY): Payer: Self-pay

## 2016-12-06 VITALS — BP 135/65 | HR 69 | Temp 98.0°F | Resp 18

## 2016-12-06 DIAGNOSIS — C61 Malignant neoplasm of prostate: Secondary | ICD-10-CM | POA: Diagnosis not present

## 2016-12-06 DIAGNOSIS — Z5111 Encounter for antineoplastic chemotherapy: Secondary | ICD-10-CM | POA: Diagnosis not present

## 2016-12-06 MED ORDER — LEUPROLIDE ACETATE 7.5 MG IM KIT
7.5000 mg | PACK | Freq: Once | INTRAMUSCULAR | Status: AC
  Administered 2016-12-06: 7.5 mg via INTRAMUSCULAR
  Filled 2016-12-06: qty 7.5

## 2016-12-06 NOTE — Patient Instructions (Signed)
[facility] at [facility] Discharge Instructions  RECOMMENDATIONS MADE BY THE CONSULTANT AND ANY TEST RESULTS WILL BE SENT TO YOUR REFERRING PHYSICIAN.  Received Lupron injection today. Follow-up as scheduled. Call clinic for any questions or concerns  Thank you for choosing [facility] at [facility] to provide your oncology and hematology care.  To afford each patient quality time with our provider, please arrive at least 15 minutes before your scheduled appointment time.    If you have a lab appointment with the Cancer Center please come in thru the  Main Entrance and check in at the main information desk  You need to re-schedule your appointment should you arrive 10 or more minutes late.  We strive to give you quality time with our providers, and arriving late affects you and other patients whose appointments are after yours.  Also, if you no show three or more times for appointments you may be dismissed from the clinic at the providers discretion.     Again, thank you for choosing [facility].  Our hope is that these requests will decrease the amount of time that you wait before being seen by our physicians.       _____________________________________________________________  Should you have questions after your visit to [facility], please contact our office at [phone] between the hours of 8:30 a.m. and 4:30 p.m.  Voicemails left after 4:30 p.m. will not be returned until the following business day.  For prescription refill requests, have your pharmacy contact our office.       Resources For Cancer Patients and their Caregivers ? American Cancer Society: Can assist with transportation, wigs, general needs, runs Look Good Feel Better.        1-888-227-6333 ? Cancer Care: Provides financial assistance, online support groups, medication/co-pay assistance.  1-800-813-HOPE (4673) ? [facility] Assists [place] cancer patients and their families through emotional , educational and financial support.  336-427-4357 ? [place] DSS Where to apply for food stamps, Medicaid and utility assistance. 336-342-1394 ? RCATS: Transportation to medical appointments. 336-347-2287 ? Social Security Administration: May apply for disability if have a Stage IV cancer. 336-342-7796 1-800-772-1213 ? [place] Aging, Disability and Transit Services: Assists with nutrition, care and transit needs. 336-349-2343  Cancer Center Support Programs: @10RELATIVEDAYS@ > Cancer Support Group  2nd Tuesday of the month 1pm-2pm, Journey Room  > Creative Journey  3rd Tuesday of the month 1130am-1pm, Journey Room  > Look Good Feel Better  1st Wednesday of the month 10am-12 noon, Journey Room (Call [employer] to register 1-800-395-5775)   

## 2016-12-06 NOTE — Progress Notes (Signed)
Hunter Jones tolerated Lupron injection well without complaints or incident. VSS Pt discharged self ambulatory in satisfactory condition

## 2016-12-07 NOTE — Progress Notes (Signed)
Hunter Kitchen  HEMATOLOGY ONCOLOGY PROGRESS NOTE  Date of service: .11/26/2016  Patient Care Team: Monico Blitz, MD as PCP - General (Internal Medicine)  Cc: F/U for h/o prostate cancer with increasing PSA levels  Current Treatment: Observation  SUMMARY OF ONCOLOGIC HISTORY:   Prostate cancer ([place])   02/06/2014 Procedure    Prostate biopsy by Dr. Clyde Lundborg      02/11/2014 Pathology Results    Prostatic adenocarcinoma identified in 5 of 6 prostate specimens with Gleason pattern showing primary pattern-grade 4, secondary pattern grade 3.  Total Gleason score equals 7.  Proportion of prostatic tissue involved by tumor: 70%.      05/03/2014 Imaging    Bone scan- Negative       06/27/2014 Procedure    Radical resection of prostate by Dr. Raynelle Bring      07/29/2014 Pathology Results    1. Prostate, radical resection - PROSTATIC ADENOCARCINOMA, GLEASON SCORE 4 + 3 = 7. - RIGHT AND LEFT PROSTATE INVOLVED. - RIGHT AND LEFT SEMINAL VESICLES INVOLVED BY TUMOR. - EXTRACAPSULAR EXTENSION BY TUMOR. - TUMOR EXTENDS INTO BLADDER NECK TISSUE. - MARGINS NOT INVOLVED. 2. Lymph nodes, regional resection, left pelvic - THREE BENIGN LYMPH NODES (0/3). 3. Lymph nodes, regional resection, right pelvic - FOUR BENIGN LYMPH NODES (0/4).      11/16/2016 Imaging    Bone scan- New areas of increased uptake superolateral to the left orbit (faint), at S1, and in the posterolateral aspect of the left sixth rib.       11/25/2016 Imaging    CT CAP- 1. Status post radical prostatectomy. No findings to suggest local recurrence of disease or definite extraskeletal metastatic disease in the chest, abdomen or pelvis. However, there are several osseous lesions, as above, concerning for metastatic disease to the bones. 2. Hepatic steatosis. 3. Aortic atherosclerosis, in addition to 2 vessel coronary artery disease. Please note that although the presence of coronary artery calcium documents the presence of  coronary artery disease, the severity of this disease and any potential stenosis cannot be assessed on this non-gated CT examination. Assessment for potential risk factor modification, dietary therapy or pharmacologic therapy may be warranted, if clinically indicated. 4. Additional incidental findings, as above.       INTERVAL HISTORY:  Hunter Jones is here for f/u of his Prostate cancer and repeat imaging studies done in the setting of an increasing PSA level. His PSA level went from 1.6 on 06/24/2016 up to 3.68 on 11/09/2016 with his testosterone level of 257 that does not suggest appropriate testosterone suppression. 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 Alinda Money on 06/27/2014 and radiation. He received ADT every 3 months 2 prior to surgery since his surgery had to be delayed on account of an MI. He notes that he had significant emotional issues and therefore ADT was not continued after surgery and radiation.  Chest CT scan of the chest abdomen pelvis shows No findings to suggest local recurrence of disease or definite extraskeletal metastatic disease in the chest, abdomen or pelvis. However, there are several osseous lesions, as above, concerning for metastatic disease to the bones. Whole-body bone scan shows New areas of increased uptake superolateral to the left orbit (faint), at S1, and in the posterolateral aspect of the left sixth rib. These findings are worrisome for metastatic disease.  REVIEW OF SYSTEMS:    10 Point review of systems of done and is negative except as noted  above.  . Past Medical History:  Diagnosis Date  . COPD (chronic obstructive pulmonary disease) ([place])   . Coronary artery disease   . Hypertension   . Hyperthyroidism   . Myocardial infarction [facility]) 2014   followed by Dr. Hamilton Capri / cardiologist - Linna Hoff  . Prostate cancer ([place])   . Prostate cancer ([place]) 06/27/2014  . Shortness of  breath dyspnea    with exertion    . Past Surgical History:  Procedure Laterality Date  . APPENDECTOMY    . LYMPHADENECTOMY Bilateral 06/27/2014   Procedure: LYMPHADENECTOMY;  Surgeon: Raynelle Bring, MD;  Location: [facility];  Service: Urology;  Laterality: Bilateral;  . ROBOT ASSISTED LAPAROSCOPIC RADICAL PROSTATECTOMY N/A 06/27/2014   Procedure: ROBOTIC ASSISTED LAPAROSCOPIC RADICAL PROSTATECTOMY LEVEL 3;  Surgeon: Raynelle Bring, MD;  Location: [facility];  Service: Urology;  Laterality: N/A;  . stented coronary artery  01/2013   2 stents  . TONSILLECTOMY      . Social History  Substance Use Topics  . Smoking status: Former Smoker    Years: 40.00    Types: Cigarettes    Quit date: 06/21/2012  . Smokeless tobacco: Never Used  . Alcohol use Yes     Comment: occasional    ALLERGIES:  is allergic to penicillins.  MEDICATIONS:  Current Outpatient Prescriptions  Medication Sig Dispense Refill  . furosemide (LASIX) 20 MG tablet Take 1 tablet (20 mg total) by mouth daily. 10 tablet 0  . albuterol (PROVENTIL HFA;VENTOLIN HFA) 108 (90 BASE) MCG/ACT inhaler Inhale 1-2 puffs into the lungs every 6 (six) hours as needed for wheezing or shortness of breath.    Hunter Kitchen atorvastatin (LIPITOR) 40 MG tablet Take 40 mg by mouth at bedtime.    Hunter Kitchen co-enzyme Q-10 30 MG capsule Take 30 mg by mouth 3 (three) times daily.    . fluticasone-salmeterol (ADVAIR HFA) 115-21 MCG/ACT inhaler Inhale 2 puffs into the lungs.    Hunter Kitchen KRILL OIL PO Take by mouth.    . losartan (COZAAR) 25 MG tablet Take 25 mg by mouth daily.    . methimazole (TAPAZOLE) 10 MG tablet Take 10 mg by mouth daily.    . metoprolol succinate (TOPROL-XL) 50 MG 24 hr tablet Take 50 mg by mouth every morning. Take with or immediately following a meal.    . Multiple Vitamin (MULTIVITAMIN) tablet Take 1 tablet by mouth daily.    Hunter Kitchen oxybutynin (DITROPAN XL) 5 MG 24 hr tablet Take 1 tablet (5 mg total) by mouth at bedtime. 30 tablet 2   No current  facility-administered medications for this visit.     PHYSICAL EXAMINATION: ECOG PERFORMANCE STATUS: 1 - Symptomatic but completely ambulatory  . Vitals:   11/26/16 1430  BP: (!) 101/58  Pulse: 87  Resp: 18    Filed Weights   11/26/16 1430  Weight: 292 lb 8 oz (132.7 kg)   .Body mass index is 40.8 kg/m.  GENERAL:alert, in no acute distress and comfortable SKIN: no acute rashes, no significant lesions EYES: conjunctiva are pink and non-injected, sclera anicteric OROPHARYNX: MMM, no exudates, no oropharyngeal erythema or ulceration NECK: supple, no JVD LYMPH:  no palpable lymphadenopathy in the cervical, axillary or inguinal regions LUNGS: clear to auscultation b/l with normal respiratory effort HEART: regular rate & rhythm ABDOMEN:  normoactive bowel sounds , non tender, not distended. Extremity: no pedal edema PSYCH: alert & oriented x 3 with fluent speech NEURO: no focal motor/sensory deficits  LABORATORY DATA:   I have reviewed the  data as listed  . CBC Latest Ref Rng & Units 11/09/2016 06/28/2014 06/27/2014  WBC 4.0 - 10.5 K/uL 7.9 - -  Hemoglobin 13.0 - 17.0 g/dL 14.9 13.8 14.5  Hematocrit 39.0 - 52.0 % 42.7 39.5 40.6  Platelets 150 - 400 K/uL 124(L) - -    . CMP Latest Ref Rng & Units 11/09/2016 06/21/2014  Glucose 65 - 99 mg/dL 151(H) 95  BUN 6 - 20 mg/dL 26(H) 22  Creatinine 0.61 - 1.24 mg/dL 1.18 0.95  Sodium 135 - 145 mmol/L 141 142  Potassium 3.5 - 5.1 mmol/L 4.6 4.7  Chloride 101 - 111 mmol/L 106 107  CO2 22 - 32 mmol/L 26 27  Calcium 8.9 - 10.3 mg/dL 9.0 9.5  Total Protein 6.5 - 8.1 g/dL 6.5 -  Total Bilirubin 0.3 - 1.2 mg/dL 0.7 -  Alkaline Phos 38 - 126 U/L 127(H) -  AST 15 - 41 U/L 31 -  ALT 17 - 63 U/L 35 -   . Lab Results  Component Value Date   PSA 3.68 11/09/2016     RADIOGRAPHIC STUDIES: I have personally reviewed the radiological images as listed and agreed with the findings in the report. Ct Chest W Contrast  Result Date:  11/25/2016 CLINICAL DATA:  61 year old male with history of prostate cancer with rising PSA levels, status post prostatectomy and radiation therapy. Evaluate for metastatic disease. EXAM: CT CHEST, ABDOMEN, AND PELVIS WITH CONTRAST TECHNIQUE: Multidetector CT imaging of the chest, abdomen and pelvis was performed following the standard protocol during bolus administration of intravenous contrast. CONTRAST:  160mL ISOVUE-300 IOPAMIDOL (ISOVUE-300) INJECTION 61% COMPARISON:  CT pelvis 02/26/2015. FINDINGS: CT CHEST FINDINGS Cardiovascular: Heart size is normal. There is no significant pericardial fluid, thickening or pericardial calcification. There is aortic atherosclerosis, as well as atherosclerosis of the great vessels of the mediastinum and the coronary arteries, including calcified atherosclerotic plaque in the left anterior descending and right coronary arteries. Left anterior descending coronary artery stent incidentally noted. Mediastinum/Nodes: No pathologically enlarged mediastinal or hilar lymph nodes. Esophagus is unremarkable in appearance. No axillary lymphadenopathy. Lungs/Pleura: No acute consolidative airspace disease. No pleural effusions. No suspicious appearing pulmonary nodules or masses are noted. Mild diffuse bronchial wall thickening with very mild centrilobular and paraseptal emphysema. Musculoskeletal: Subtle sclerotic lesion in the posterolateral aspect of the left seventh rib (axial image 35 of series 2) measuring 8 mm. CT ABDOMEN PELVIS FINDINGS Hepatobiliary: Diffuse low attenuation throughout the hepatic parenchyma, compatible with hepatic steatosis. No cystic or solid hepatic lesions. No intra or extrahepatic biliary ductal dilatation. Gallbladder is normal in appearance. Pancreas: No pancreatic mass. No pancreatic ductal dilatation. No pancreatic or peripancreatic fluid or inflammatory changes. Spleen: Unremarkable. Adrenals/Urinary Tract: Bilateral kidneys and bilateral adrenal  glands are normal in appearance. No hydroureteronephrosis. Urinary bladder is normal in appearance. Stomach/Bowel: The appearance of the stomach is normal. There is no pathologic dilatation of small bowel or colon. Numerous colonic diverticulae are noted, without surrounding inflammatory changes to suggest an acute diverticulitis at this time. The appendix is not confidently identified and may be surgically absent. Regardless, there are no inflammatory changes noted adjacent to the cecum to suggest the presence of an acute appendicitis at this time. Vascular/Lymphatic: Aortic atherosclerosis, without evidence of aneurysm or dissection in the abdominal or pelvic vasculature. No lymphadenopathy noted in the abdomen or pelvis. Reproductive: Status post radical prostatectomy. No unexpected soft tissue mass noted in the prostatectomy bed to suggest locally recurrent disease. Other: No significant volume of ascites.  No pneumoperitoneum. Musculoskeletal: Large area of sclerosis in the S1 vertebra measuring up to 6.9 x 3.3 cm (axial image 104 of series 2), new compared to the prior study, suspicious for a bony metastasis. Subtle area of sclerosis in the right femoral head measuring 1 cm (axial image 125 of series 2), new compared to the prior study, likely a small metastatic lesion. 6 mm sclerotic lesion with narrow zone of transition in the right ilium immediately above the right acetabulum (axial image 116 of series 2), unchanged compared to 2015, most compatible with a small bone island. IMPRESSION: 1. Status post radical prostatectomy. No findings to suggest local recurrence of disease or definite extraskeletal metastatic disease in the chest, abdomen or pelvis. However, there are several osseous lesions, as above, concerning for metastatic disease to the bones. 2. Hepatic steatosis. 3. Aortic atherosclerosis, in addition to 2 vessel coronary artery disease. Please note that although the presence of coronary artery  calcium documents the presence of coronary artery disease, the severity of this disease and any potential stenosis cannot be assessed on this non-gated CT examination. Assessment for potential risk factor modification, dietary therapy or pharmacologic therapy may be warranted, if clinically indicated. 4. Additional incidental findings, as above. Electronically Signed   By: Vinnie Langton M.D.   On: 11/25/2016 08:24   Nm Bone Scan Whole Body  Result Date: 11/16/2016 CLINICAL DATA:  Prostate malignancy diagnosed with in 2015. Patient is noted to have a rising PSA. The patient reports left wrist pain for the past week. No history of falls. EXAM: NUCLEAR MEDICINE WHOLE BODY BONE SCAN TECHNIQUE: Whole body anterior and posterior images were obtained approximately 3 hours after intravenous injection of radiopharmaceutical. RADIOPHARMACEUTICALS:  Twenty-two mCi Technetium-79m MDP IV COMPARISON:  Nuclear bone scan of May 03, 2014 FINDINGS: There is adequate uptake of the radiopharmaceutical by the skeleton. There is adequate soft tissue clearance and renal activity. There is a punctate focus of increased uptake superior and lateral to the left orbit. Elsewhere calvarial uptake is normal. Uptake within the cervical and thoracic spine is normal. There is increased uptake associated with the first sacral segment which is new. There is minimal increased uptake in the posterior aspect of the left 6th rib which is new. Uptake within the visualized portions of the upper extremities is within the limits of normal. Mildly increased uptake in the wrists and hands is likely degenerative in nature. There is minimal increased uptake in in both knees compatible with degenerative change. IMPRESSION: New areas of increased uptake superolateral to the left orbit (faint), at S1, and in the posterolateral aspect of the left sixth rib. These findings are worrisome for metastatic disease. Plain radiographs are recommended. Limited  visualization of the wrists. Mildly increased uptake within the visualized portions of both wrists is likely degenerative in nature. Electronically Signed   By: David  Martinique M.D.   On: 11/16/2016 14:54   Ct Abdomen Pelvis W Contrast  Result Date: 11/25/2016 CLINICAL DATA:  61 year old male with history of prostate cancer with rising PSA levels, status post prostatectomy and radiation therapy. Evaluate for metastatic disease. EXAM: CT CHEST, ABDOMEN, AND PELVIS WITH CONTRAST TECHNIQUE: Multidetector CT imaging of the chest, abdomen and pelvis was performed following the standard protocol during bolus administration of intravenous contrast. CONTRAST:  133mL ISOVUE-300 IOPAMIDOL (ISOVUE-300) INJECTION 61% COMPARISON:  CT pelvis 02/26/2015. FINDINGS: CT CHEST FINDINGS Cardiovascular: Heart size is normal. There is no significant pericardial fluid, thickening or pericardial calcification. There is aortic atherosclerosis, as well  as atherosclerosis of the great vessels of the mediastinum and the coronary arteries, including calcified atherosclerotic plaque in the left anterior descending and right coronary arteries. Left anterior descending coronary artery stent incidentally noted. Mediastinum/Nodes: No pathologically enlarged mediastinal or hilar lymph nodes. Esophagus is unremarkable in appearance. No axillary lymphadenopathy. Lungs/Pleura: No acute consolidative airspace disease. No pleural effusions. No suspicious appearing pulmonary nodules or masses are noted. Mild diffuse bronchial wall thickening with very mild centrilobular and paraseptal emphysema. Musculoskeletal: Subtle sclerotic lesion in the posterolateral aspect of the left seventh rib (axial image 35 of series 2) measuring 8 mm. CT ABDOMEN PELVIS FINDINGS Hepatobiliary: Diffuse low attenuation throughout the hepatic parenchyma, compatible with hepatic steatosis. No cystic or solid hepatic lesions. No intra or extrahepatic biliary ductal dilatation.  Gallbladder is normal in appearance. Pancreas: No pancreatic mass. No pancreatic ductal dilatation. No pancreatic or peripancreatic fluid or inflammatory changes. Spleen: Unremarkable. Adrenals/Urinary Tract: Bilateral kidneys and bilateral adrenal glands are normal in appearance. No hydroureteronephrosis. Urinary bladder is normal in appearance. Stomach/Bowel: The appearance of the stomach is normal. There is no pathologic dilatation of small bowel or colon. Numerous colonic diverticulae are noted, without surrounding inflammatory changes to suggest an acute diverticulitis at this time. The appendix is not confidently identified and may be surgically absent. Regardless, there are no inflammatory changes noted adjacent to the cecum to suggest the presence of an acute appendicitis at this time. Vascular/Lymphatic: Aortic atherosclerosis, without evidence of aneurysm or dissection in the abdominal or pelvic vasculature. No lymphadenopathy noted in the abdomen or pelvis. Reproductive: Status post radical prostatectomy. No unexpected soft tissue mass noted in the prostatectomy bed to suggest locally recurrent disease. Other: No significant volume of ascites.  No pneumoperitoneum. Musculoskeletal: Large area of sclerosis in the S1 vertebra measuring up to 6.9 x 3.3 cm (axial image 104 of series 2), new compared to the prior study, suspicious for a bony metastasis. Subtle area of sclerosis in the right femoral head measuring 1 cm (axial image 125 of series 2), new compared to the prior study, likely a small metastatic lesion. 6 mm sclerotic lesion with narrow zone of transition in the right ilium immediately above the right acetabulum (axial image 116 of series 2), unchanged compared to 2015, most compatible with a small bone island. IMPRESSION: 1. Status post radical prostatectomy. No findings to suggest local recurrence of disease or definite extraskeletal metastatic disease in the chest, abdomen or pelvis. However,  there are several osseous lesions, as above, concerning for metastatic disease to the bones. 2. Hepatic steatosis. 3. Aortic atherosclerosis, in addition to 2 vessel coronary artery disease. Please note that although the presence of coronary artery calcium documents the presence of coronary artery disease, the severity of this disease and any potential stenosis cannot be assessed on this non-gated CT examination. Assessment for potential risk factor modification, dietary therapy or pharmacologic therapy may be warranted, if clinically indicated. 4. Additional incidental findings, as above. Electronically Signed   By: Vinnie Langton M.D.   On: 11/25/2016 08:24    ASSESSMENT & PLAN:   1) metastatic castration sensitive  Prostate cancer ([place]) with recent imaging shows evidence of low burden bony metastases and increasing PSA level with a doubling time less than 6 months .  Previous history of  Stage IIIB (pT3pN0) prostate cancer, Gleason 4+3=7.  S/P prostatectomy by Dr. Alinda Money on 06/27/2014 and radiation.  Initial biopsy on 02/06/2014 by Dr. Clyde Lundborg shows a prostate adenocarcinoma in 5/6 cores.  Now with slowly  rising PSA- 03/2016: 0.8 ng/ml To 06/2016: 1.6 ng/ml.  5/29 3.68. Patient notes some lower back pain and chest wall pain.  He previously has had some emotional disturbances and hot flashes related to Lupron shots which is why his ADT was cut short previously. Patient notes that his mortality might have been in the setting of coronary artery disease at the time as well and he is willing to reconsider ADT. Plan -Discussed the CT scan and bone scan results in details with the patient and his wife. He understands the current status of his disease as showing metastatic lesions to the bone. -He does not qualify as high burden disease and is assumed to be castration sensitive at this point. -We shall start him on Lupron with the first dose being a one-month shot at 7.5 mg IM. If he has no issues with  tolerance of this medication he could be switched to every 3 monthly doses from his next visit. -Would recommend getting a DEXA scan on follow-up to check for osteoporosis/osteopenia. -Consider the use of Xgeva after dental clearance. Data on the benefits for SRE reduction in low-volume castration sensitive disease is not definitive.  Start Lupron injection in 1 week. Return in 3 weeks for follow up for toxicity check and continued cares.   I spent 30 minutes counseling the patient face to face. The total time spent in the appointment was 40 minutes and more than 50% was on counseling and direct patient cares.    Sullivan Lone MD [place] AAHIVMS [facility] Hematology/Oncology Physician [facility]  (Office):       [phone] (Work cell):  [phone] (Fax):           [phone]

## 2016-12-17 ENCOUNTER — Ambulatory Visit (HOSPITAL_COMMUNITY): Payer: BLUE CROSS/BLUE SHIELD | Admitting: Adult Health

## 2016-12-21 ENCOUNTER — Encounter (HOSPITAL_COMMUNITY): Payer: Self-pay

## 2016-12-21 ENCOUNTER — Encounter (HOSPITAL_COMMUNITY): Payer: BLUE CROSS/BLUE SHIELD | Attending: Oncology | Admitting: Oncology

## 2016-12-21 VITALS — BP 115/54 | HR 70 | Resp 18 | Ht 71.0 in | Wt 292.0 lb

## 2016-12-21 DIAGNOSIS — R310 Gross hematuria: Secondary | ICD-10-CM | POA: Insufficient documentation

## 2016-12-21 DIAGNOSIS — E0590 Thyrotoxicosis, unspecified without thyrotoxic crisis or storm: Secondary | ICD-10-CM | POA: Insufficient documentation

## 2016-12-21 DIAGNOSIS — C7951 Secondary malignant neoplasm of bone: Secondary | ICD-10-CM | POA: Diagnosis not present

## 2016-12-21 DIAGNOSIS — M7989 Other specified soft tissue disorders: Secondary | ICD-10-CM | POA: Insufficient documentation

## 2016-12-21 DIAGNOSIS — I252 Old myocardial infarction: Secondary | ICD-10-CM | POA: Insufficient documentation

## 2016-12-21 DIAGNOSIS — C61 Malignant neoplasm of prostate: Secondary | ICD-10-CM | POA: Diagnosis not present

## 2016-12-21 DIAGNOSIS — J449 Chronic obstructive pulmonary disease, unspecified: Secondary | ICD-10-CM | POA: Insufficient documentation

## 2016-12-21 DIAGNOSIS — I2510 Atherosclerotic heart disease of native coronary artery without angina pectoris: Secondary | ICD-10-CM | POA: Insufficient documentation

## 2016-12-21 DIAGNOSIS — Z87891 Personal history of nicotine dependence: Secondary | ICD-10-CM | POA: Insufficient documentation

## 2016-12-21 DIAGNOSIS — I10 Essential (primary) hypertension: Secondary | ICD-10-CM | POA: Insufficient documentation

## 2016-12-21 NOTE — Progress Notes (Signed)
[name] Kitchen  HEMATOLOGY ONCOLOGY PROGRESS NOTE  Date of service: .12/21/2016  Patient Care Team: Monico Blitz, MD as PCP - General (Internal Medicine)  Cc: F/U for h/o prostate cancer with increasing PSA levels  Current Treatment: Observation  SUMMARY OF ONCOLOGIC HISTORY:   Prostate cancer ([place])   02/06/2014 Procedure    Prostate biopsy by Dr. Clyde Lundborg      02/11/2014 Pathology Results    Prostatic adenocarcinoma identified in 5 of 6 prostate specimens with Gleason pattern showing primary pattern-grade 4, secondary pattern grade 3.  Total Gleason score equals 7.  Proportion of prostatic tissue involved by tumor: 70%.      05/03/2014 Imaging    Bone scan- Negative       06/27/2014 Procedure    Radical resection of prostate by Dr. Raynelle Bring      07/29/2014 Pathology Results    1. Prostate, radical resection - PROSTATIC ADENOCARCINOMA, GLEASON SCORE 4 + 3 = 7. - RIGHT AND LEFT PROSTATE INVOLVED. - RIGHT AND LEFT SEMINAL VESICLES INVOLVED BY TUMOR. - EXTRACAPSULAR EXTENSION BY TUMOR. - TUMOR EXTENDS INTO BLADDER NECK TISSUE. - MARGINS NOT INVOLVED. 2. Lymph nodes, regional resection, left pelvic - THREE BENIGN LYMPH NODES (0/3). 3. Lymph nodes, regional resection, right pelvic - FOUR BENIGN LYMPH NODES (0/4).      11/16/2016 Imaging    Bone scan- New areas of increased uptake superolateral to the left orbit (faint), at S1, and in the posterolateral aspect of the left sixth rib.       11/25/2016 Imaging    CT CAP- 1. Status post radical prostatectomy. No findings to suggest local recurrence of disease or definite extraskeletal metastatic disease in the chest, abdomen or pelvis. However, there are several osseous lesions, as above, concerning for metastatic disease to the bones. 2. Hepatic steatosis. 3. Aortic atherosclerosis, in addition to 2 vessel coronary artery disease. Please note that although the presence of coronary artery calcium documents the presence of  coronary artery disease, the severity of this disease and any potential stenosis cannot be assessed on this non-gated CT examination. Assessment for potential risk factor modification, dietary therapy or pharmacologic therapy may be warranted, if clinically indicated. 4. Additional incidental findings, as above.       INTERVAL HISTORY:  Hunter Jones is here for f/u of his Prostate cancer and repeat imaging studies done in the setting of an increasing PSA level. His PSA level went from 1.6 on 06/24/2016 up to 3.68 on 11/09/2016 with his testosterone level of 257 that does not suggest appropriate testosterone suppression. 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 Alinda Money on 06/27/2014 and radiation. He received ADT every 3 months 2 prior to surgery since his surgery had to be delayed on account of an MI. He notes that he had significant emotional issues and therefore ADT was not continued after surgery and radiation.  Chest CT scan of the chest abdomen pelvis shows No findings to suggest local recurrence of disease or definite extraskeletal metastatic disease in the chest, abdomen or pelvis. However, there are several osseous lesions, as above, concerning for metastatic disease to the bones. Whole-body bone scan shows New areas of increased uptake superolateral to the left orbit (faint), at S1, and in the posterolateral aspect of the left sixth rib. These findings are worrisome for metastatic disease.  Today patient presented for follow up. He received his first dose of lupron 7.5 mg qmonthly on 12/06/16 and  he tolerated it well without any side effects. Overall he is doing well and denies any CP, SOB, abdominal pain, fatigue, focal weakness. He has intermittent low back pain when he over exerts himself but otherwise denies any bone pain. 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   REVIEW OF SYSTEMS:    10 Point review of  systems of done and is negative except as noted above.  . Past Medical History:  Diagnosis Date  . COPD (chronic obstructive pulmonary disease) ([place])   . Coronary artery disease   . Hypertension   . Hyperthyroidism   . Myocardial infarction [facility]) 2014   followed by Dr. Hamilton Capri / cardiologist - Linna Hoff  . Prostate cancer ([place])   . Prostate cancer ([place]) 06/27/2014  . Shortness of breath dyspnea    with exertion    . Past Surgical History:  Procedure Laterality Date  . APPENDECTOMY    . LYMPHADENECTOMY Bilateral 06/27/2014   Procedure: LYMPHADENECTOMY;  Surgeon: Raynelle Bring, MD;  Location: [facility];  Service: Urology;  Laterality: Bilateral;  . ROBOT ASSISTED LAPAROSCOPIC RADICAL PROSTATECTOMY N/A 06/27/2014   Procedure: ROBOTIC ASSISTED LAPAROSCOPIC RADICAL PROSTATECTOMY LEVEL 3;  Surgeon: Raynelle Bring, MD;  Location: [facility];  Service: Urology;  Laterality: N/A;  . stented coronary artery  01/2013   2 stents  . TONSILLECTOMY      . Social History  Substance Use Topics  . Smoking status: Former Smoker    Years: 40.00    Types: Cigarettes    Quit date: 06/21/2012  . Smokeless tobacco: Never Used  . Alcohol use Yes     Comment: occasional    ALLERGIES:  is allergic to penicillins.  MEDICATIONS:  Current Outpatient Prescriptions  Medication Sig Dispense Refill  . albuterol (PROVENTIL HFA;VENTOLIN HFA) 108 (90 BASE) MCG/ACT inhaler Inhale 1-2 puffs into the lungs every 6 (six) hours as needed for wheezing or shortness of breath.    [name] Kitchen atorvastatin (LIPITOR) 40 MG tablet Take 40 mg by mouth at bedtime.    [name] Kitchen co-enzyme Q-10 30 MG capsule Take 30 mg by mouth 3 (three) times daily.    . fluticasone-salmeterol (ADVAIR HFA) 115-21 MCG/ACT inhaler Inhale 2 puffs into the lungs.    . furosemide (LASIX) 20 MG tablet Take 1 tablet (20 mg total) by mouth daily. 10 tablet 0  . KRILL OIL PO Take by mouth.    . losartan (COZAAR) 25 MG tablet Take 25 mg by mouth daily.    . methimazole  (TAPAZOLE) 10 MG tablet Take 10 mg by mouth daily.    . metoprolol succinate (TOPROL-XL) 50 MG 24 hr tablet Take 50 mg by mouth every morning. Take with or immediately following a meal.    . Multiple Vitamin (MULTIVITAMIN) tablet Take 1 tablet by mouth daily.    [name] Kitchen oxybutynin (DITROPAN XL) 5 MG 24 hr tablet Take 1 tablet (5 mg total) by mouth at bedtime. 30 tablet 2   No current facility-administered medications for this visit.     PHYSICAL EXAMINATION: ECOG PERFORMANCE STATUS: 1 - Symptomatic but completely ambulatory  . Vitals:   12/21/16 0954  BP: (!) 115/54  Pulse: 70  Resp: 18    Filed Weights   12/21/16 0954  Weight: 292 lb (132.5 kg)   .Body mass index is 40.73 kg/m.  Physical Exam  Constitutional: He is oriented to person, place, and time. He appears well-developed and well-nourished. No distress.  HENT:  Head: Normocephalic and atraumatic.  Mouth/Throat: No  oropharyngeal exudate.  Eyes: Conjunctivae are normal. Pupils are equal, round, and reactive to light. No scleral icterus.  Neck: Normal range of motion. Neck supple.  Cardiovascular: Normal rate, regular rhythm and normal heart sounds.   No murmur heard. Pulmonary/Chest: Effort normal and breath sounds normal. No respiratory distress. He has no wheezes.  Abdominal: Soft. Bowel sounds are normal. He exhibits no distension. There is no tenderness.  Musculoskeletal: Normal range of motion. He exhibits no edema.  Lymphadenopathy:    He has no cervical adenopathy.  Neurological: He is alert and oriented to person, place, and time. No cranial nerve deficit.  Skin: Skin is warm and dry. No rash noted. No erythema.  Psychiatric: He has a normal mood and affect. His behavior is normal. Judgment and thought content normal.     LABORATORY DATA:   I have reviewed the data as listed  . CBC Latest Ref Rng & Units 11/09/2016 06/28/2014 06/27/2014  WBC 4.0 - 10.5 K/uL 7.9 - -  Hemoglobin 13.0 - 17.0 g/dL 14.9 13.8 14.5    Hematocrit 39.0 - 52.0 % 42.7 39.5 40.6  Platelets 150 - 400 K/uL 124(L) - -    . CMP Latest Ref Rng & Units 11/09/2016 06/21/2014  Glucose 65 - 99 mg/dL 151(H) 95  BUN 6 - 20 mg/dL 26(H) 22  Creatinine 0.61 - 1.24 mg/dL 1.18 0.95  Sodium 135 - 145 mmol/L 141 142  Potassium 3.5 - 5.1 mmol/L 4.6 4.7  Chloride 101 - 111 mmol/L 106 107  CO2 22 - 32 mmol/L 26 27  Calcium 8.9 - 10.3 mg/dL 9.0 9.5  Total Protein 6.5 - 8.1 g/dL 6.5 -  Total Bilirubin 0.3 - 1.2 mg/dL 0.7 -  Alkaline Phos 38 - 126 U/L 127(H) -  AST 15 - 41 U/L 31 -  ALT 17 - 63 U/L 35 -   . Lab Results  Component Value Date   PSA 3.68 11/09/2016     RADIOGRAPHIC STUDIES: I have personally reviewed the radiological images as listed and agreed with the findings in the report. Ct Chest W Contrast  Result Date: 11/25/2016 CLINICAL DATA:  61 year old male with history of prostate cancer with rising PSA levels, status post prostatectomy and radiation therapy. Evaluate for metastatic disease. EXAM: CT CHEST, ABDOMEN, AND PELVIS WITH CONTRAST TECHNIQUE: Multidetector CT imaging of the chest, abdomen and pelvis was performed following the standard protocol during bolus administration of intravenous contrast. CONTRAST:  114mL ISOVUE-300 IOPAMIDOL (ISOVUE-300) INJECTION 61% COMPARISON:  CT pelvis 02/26/2015. FINDINGS: CT CHEST FINDINGS Cardiovascular: Heart size is normal. There is no significant pericardial fluid, thickening or pericardial calcification. There is aortic atherosclerosis, as well as atherosclerosis of the great vessels of the mediastinum and the coronary arteries, including calcified atherosclerotic plaque in the left anterior descending and right coronary arteries. Left anterior descending coronary artery stent incidentally noted. Mediastinum/Nodes: No pathologically enlarged mediastinal or hilar lymph nodes. Esophagus is unremarkable in appearance. No axillary lymphadenopathy. Lungs/Pleura: No acute consolidative airspace  disease. No pleural effusions. No suspicious appearing pulmonary nodules or masses are noted. Mild diffuse bronchial wall thickening with very mild centrilobular and paraseptal emphysema. Musculoskeletal: Subtle sclerotic lesion in the posterolateral aspect of the left seventh rib (axial image 35 of series 2) measuring 8 mm. CT ABDOMEN PELVIS FINDINGS Hepatobiliary: Diffuse low attenuation throughout the hepatic parenchyma, compatible with hepatic steatosis. No cystic or solid hepatic lesions. No intra or extrahepatic biliary ductal dilatation. Gallbladder is normal in appearance. Pancreas: No pancreatic mass. No pancreatic  ductal dilatation. No pancreatic or peripancreatic fluid or inflammatory changes. Spleen: Unremarkable. Adrenals/Urinary Tract: Bilateral kidneys and bilateral adrenal glands are normal in appearance. No hydroureteronephrosis. Urinary bladder is normal in appearance. Stomach/Bowel: The appearance of the stomach is normal. There is no pathologic dilatation of small bowel or colon. Numerous colonic diverticulae are noted, without surrounding inflammatory changes to suggest an acute diverticulitis at this time. The appendix is not confidently identified and may be surgically absent. Regardless, there are no inflammatory changes noted adjacent to the cecum to suggest the presence of an acute appendicitis at this time. Vascular/Lymphatic: Aortic atherosclerosis, without evidence of aneurysm or dissection in the abdominal or pelvic vasculature. No lymphadenopathy noted in the abdomen or pelvis. Reproductive: Status post radical prostatectomy. No unexpected soft tissue mass noted in the prostatectomy bed to suggest locally recurrent disease. Other: No significant volume of ascites.  No pneumoperitoneum. Musculoskeletal: Large area of sclerosis in the S1 vertebra measuring up to 6.9 x 3.3 cm (axial image 104 of series 2), new compared to the prior study, suspicious for a bony metastasis. Subtle area of  sclerosis in the right femoral head measuring 1 cm (axial image 125 of series 2), new compared to the prior study, likely a small metastatic lesion. 6 mm sclerotic lesion with narrow zone of transition in the right ilium immediately above the right acetabulum (axial image 116 of series 2), unchanged compared to 2015, most compatible with a small bone island. IMPRESSION: 1. Status post radical prostatectomy. No findings to suggest local recurrence of disease or definite extraskeletal metastatic disease in the chest, abdomen or pelvis. However, there are several osseous lesions, as above, concerning for metastatic disease to the bones. 2. Hepatic steatosis. 3. Aortic atherosclerosis, in addition to 2 vessel coronary artery disease. Please note that although the presence of coronary artery calcium documents the presence of coronary artery disease, the severity of this disease and any potential stenosis cannot be assessed on this non-gated CT examination. Assessment for potential risk factor modification, dietary therapy or pharmacologic therapy may be warranted, if clinically indicated. 4. Additional incidental findings, as above. Electronically Signed   By: Vinnie Langton M.D.   On: 11/25/2016 08:24   Ct Abdomen Pelvis W Contrast  Result Date: 11/25/2016 CLINICAL DATA:  61 year old male with history of prostate cancer with rising PSA levels, status post prostatectomy and radiation therapy. Evaluate for metastatic disease. EXAM: CT CHEST, ABDOMEN, AND PELVIS WITH CONTRAST TECHNIQUE: Multidetector CT imaging of the chest, abdomen and pelvis was performed following the standard protocol during bolus administration of intravenous contrast. CONTRAST:  176mL ISOVUE-300 IOPAMIDOL (ISOVUE-300) INJECTION 61% COMPARISON:  CT pelvis 02/26/2015. FINDINGS: CT CHEST FINDINGS Cardiovascular: Heart size is normal. There is no significant pericardial fluid, thickening or pericardial calcification. There is aortic  atherosclerosis, as well as atherosclerosis of the great vessels of the mediastinum and the coronary arteries, including calcified atherosclerotic plaque in the left anterior descending and right coronary arteries. Left anterior descending coronary artery stent incidentally noted. Mediastinum/Nodes: No pathologically enlarged mediastinal or hilar lymph nodes. Esophagus is unremarkable in appearance. No axillary lymphadenopathy. Lungs/Pleura: No acute consolidative airspace disease. No pleural effusions. No suspicious appearing pulmonary nodules or masses are noted. Mild diffuse bronchial wall thickening with very mild centrilobular and paraseptal emphysema. Musculoskeletal: Subtle sclerotic lesion in the posterolateral aspect of the left seventh rib (axial image 35 of series 2) measuring 8 mm. CT ABDOMEN PELVIS FINDINGS Hepatobiliary: Diffuse low attenuation throughout the hepatic parenchyma, compatible with hepatic steatosis. No cystic  or solid hepatic lesions. No intra or extrahepatic biliary ductal dilatation. Gallbladder is normal in appearance. Pancreas: No pancreatic mass. No pancreatic ductal dilatation. No pancreatic or peripancreatic fluid or inflammatory changes. Spleen: Unremarkable. Adrenals/Urinary Tract: Bilateral kidneys and bilateral adrenal glands are normal in appearance. No hydroureteronephrosis. Urinary bladder is normal in appearance. Stomach/Bowel: The appearance of the stomach is normal. There is no pathologic dilatation of small bowel or colon. Numerous colonic diverticulae are noted, without surrounding inflammatory changes to suggest an acute diverticulitis at this time. The appendix is not confidently identified and may be surgically absent. Regardless, there are no inflammatory changes noted adjacent to the cecum to suggest the presence of an acute appendicitis at this time. Vascular/Lymphatic: Aortic atherosclerosis, without evidence of aneurysm or dissection in the abdominal or pelvic  vasculature. No lymphadenopathy noted in the abdomen or pelvis. Reproductive: Status post radical prostatectomy. No unexpected soft tissue mass noted in the prostatectomy bed to suggest locally recurrent disease. Other: No significant volume of ascites.  No pneumoperitoneum. Musculoskeletal: Large area of sclerosis in the S1 vertebra measuring up to 6.9 x 3.3 cm (axial image 104 of series 2), new compared to the prior study, suspicious for a bony metastasis. Subtle area of sclerosis in the right femoral head measuring 1 cm (axial image 125 of series 2), new compared to the prior study, likely a small metastatic lesion. 6 mm sclerotic lesion with narrow zone of transition in the right ilium immediately above the right acetabulum (axial image 116 of series 2), unchanged compared to 2015, most compatible with a small bone island. IMPRESSION: 1. Status post radical prostatectomy. No findings to suggest local recurrence of disease or definite extraskeletal metastatic disease in the chest, abdomen or pelvis. However, there are several osseous lesions, as above, concerning for metastatic disease to the bones. 2. Hepatic steatosis. 3. Aortic atherosclerosis, in addition to 2 vessel coronary artery disease. Please note that although the presence of coronary artery calcium documents the presence of coronary artery disease, the severity of this disease and any potential stenosis cannot be assessed on this non-gated CT examination. Assessment for potential risk factor modification, dietary therapy or pharmacologic therapy may be warranted, if clinically indicated. 4. Additional incidental findings, as above. Electronically Signed   By: Vinnie Langton M.D.   On: 11/25/2016 08:24    ASSESSMENT & PLAN:   1) metastatic castration sensitive  Prostate cancer ([place]) with recent imaging shows evidence of low burden bony metastases and increasing PSA level with a doubling time less than 6 months .  Previous history of  Stage  IIIB (pT3pN0) prostate cancer, Gleason 4+3=7.  S/P prostatectomy by Dr. Alinda Money on 06/27/2014 and radiation.  Initial biopsy on 02/06/2014 by Dr. Clyde Lundborg shows a prostate adenocarcinoma in 5/6 cores.  Now with slowly rising PSA- 03/2016: 0.8 ng/ml To 06/2016: 1.6 ng/ml.  5/29 3.68. Patient notes some lower back pain and chest wall pain.  He previously has had some emotional disturbances and hot flashes related to Lupron shots which is why his ADT was cut short previously. Patient notes that his mortality might have been in the setting of coronary artery disease at the time as well and he is willing to reconsider ADT.  Plan: Schedule for DEXA scan within the next 1-2 weeks to check for osteoporosis/ osteopenia while on ADT. Tolerating lupron. Change lupron to 22.5 mg every 3 months. Discussed xgeva again with the patient and provided him with reading material. He stated he will think about it  and let me know. Data on the benefits for SRE reduction in low-volume castration sensitive disease is not definitive. RTC in 3 months for follow up with labs.  Orders Placed This Encounter  Procedures  . DG Bone Density    Standing Status:   Future    Standing Expiration Date:   12/21/2017    Order Specific Question:   Reason for Exam (SYMPTOM  OR DIAGNOSIS REQUIRED)    Answer:   on lupron, assess for bone density.    Order Specific Question:   Preferred imaging location?    Answer:   [facility]  . CBC with Differential    Standing Status:   Future    Standing Expiration Date:   12/21/2017  . Comprehensive metabolic panel    Standing Status:   Future    Standing Expiration Date:   12/21/2017  . PSA    Standing Status:   Future    Standing Expiration Date:   12/21/2017  . Testosterone    Standing Status:   Future    Standing Expiration Date:   12/21/2017

## 2016-12-27 ENCOUNTER — Ambulatory Visit (HOSPITAL_COMMUNITY)
Admission: RE | Admit: 2016-12-27 | Discharge: 2016-12-27 | Disposition: A | Discharge status: Home / Self Care | Payer: BLUE CROSS/BLUE SHIELD | Source: Ambulatory Visit | Attending: Oncology | Admitting: Oncology

## 2016-12-27 DIAGNOSIS — C61 Malignant neoplasm of prostate: Secondary | ICD-10-CM

## 2016-12-27 DIAGNOSIS — Z8781 Personal history of (healed) traumatic fracture: Secondary | ICD-10-CM | POA: Diagnosis not present

## 2016-12-27 NOTE — Progress Notes (Signed)
Please call patient and let him know that he had a normal bone density scan.

## 2017-01-06 ENCOUNTER — Encounter (HOSPITAL_COMMUNITY): Payer: Self-pay

## 2017-01-06 ENCOUNTER — Encounter (HOSPITAL_BASED_OUTPATIENT_CLINIC_OR_DEPARTMENT_OTHER): Payer: BLUE CROSS/BLUE SHIELD

## 2017-01-06 ENCOUNTER — Other Ambulatory Visit (HOSPITAL_COMMUNITY): Payer: Self-pay | Admitting: Oncology

## 2017-01-06 VITALS — BP 150/73 | HR 66 | Resp 16

## 2017-01-06 DIAGNOSIS — Z5111 Encounter for antineoplastic chemotherapy: Secondary | ICD-10-CM

## 2017-01-06 DIAGNOSIS — C7951 Secondary malignant neoplasm of bone: Secondary | ICD-10-CM

## 2017-01-06 DIAGNOSIS — C61 Malignant neoplasm of prostate: Secondary | ICD-10-CM | POA: Diagnosis not present

## 2017-01-06 MED ORDER — LEUPROLIDE ACETATE 7.5 MG IM KIT
7.5000 mg | PACK | Freq: Once | INTRAMUSCULAR | Status: AC
  Administered 2017-01-06: 7.5 mg via INTRAMUSCULAR
  Filled 2017-01-06: qty 7.5

## 2017-01-06 NOTE — Progress Notes (Signed)
Hunter Jones presents today for injection per MD orders. Lupron 7.5mg  administered IM in right upper outer buttocks Administration without incident. Patient tolerated well. Vitals stable and discharged home from clinic ambulatory. Follow up as scheduled.

## 2017-01-11 ENCOUNTER — Ambulatory Visit (INDEPENDENT_AMBULATORY_CARE_PROVIDER_SITE_OTHER): Payer: BLUE CROSS/BLUE SHIELD | Admitting: Urology

## 2017-01-11 DIAGNOSIS — C7951 Secondary malignant neoplasm of bone: Secondary | ICD-10-CM

## 2017-01-11 DIAGNOSIS — R9721 Rising PSA following treatment for malignant neoplasm of prostate: Secondary | ICD-10-CM | POA: Diagnosis not present

## 2017-01-11 DIAGNOSIS — C61 Malignant neoplasm of prostate: Secondary | ICD-10-CM | POA: Diagnosis not present

## 2017-01-11 DIAGNOSIS — Z79899 Other long term (current) drug therapy: Secondary | ICD-10-CM | POA: Diagnosis not present

## 2017-02-10 ENCOUNTER — Other Ambulatory Visit (HOSPITAL_COMMUNITY): Payer: BLUE CROSS/BLUE SHIELD

## 2017-02-10 ENCOUNTER — Ambulatory Visit (HOSPITAL_COMMUNITY): Payer: BLUE CROSS/BLUE SHIELD | Admitting: Adult Health

## 2017-03-08 ENCOUNTER — Ambulatory Visit (HOSPITAL_COMMUNITY): Payer: BLUE CROSS/BLUE SHIELD | Admitting: Adult Health

## 2017-03-08 ENCOUNTER — Ambulatory Visit (HOSPITAL_COMMUNITY): Payer: BLUE CROSS/BLUE SHIELD

## 2017-03-08 ENCOUNTER — Other Ambulatory Visit (HOSPITAL_COMMUNITY): Payer: BLUE CROSS/BLUE SHIELD

## 2017-04-07 NOTE — Progress Notes (Signed)
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     BRIEF ONCOLOGIC HISTORY:    Prostate cancer ([place])   02/06/2014 Procedure    Prostate biopsy by Dr. Clyde Lundborg      02/11/2014 Pathology Results    Prostatic adenocarcinoma identified in 5 of 6 prostate specimens with Gleason pattern showing primary pattern-grade 4, secondary pattern grade 3.  Total Gleason score equals 7.  Proportion of prostatic tissue involved by tumor: 70%.      05/03/2014 Imaging    Bone scan- Negative       06/27/2014 Procedure    Radical resection of prostate by Dr. Raynelle Bring      07/29/2014 Pathology Results    1. Prostate, radical resection - PROSTATIC ADENOCARCINOMA, GLEASON SCORE 4 + 3 = 7. - RIGHT AND LEFT PROSTATE INVOLVED. - RIGHT AND LEFT SEMINAL VESICLES INVOLVED BY TUMOR. - EXTRACAPSULAR EXTENSION BY TUMOR. - TUMOR EXTENDS INTO BLADDER NECK TISSUE. - MARGINS NOT INVOLVED. 2. Lymph nodes, regional resection, left pelvic - THREE BENIGN LYMPH NODES (0/3). 3. Lymph nodes, regional resection, right pelvic - FOUR BENIGN LYMPH NODES (0/4).      11/16/2016 Imaging    Bone scan- New areas of increased uptake superolateral to the left orbit (faint), at S1, and in the posterolateral aspect of the left sixth rib.       11/25/2016 Imaging    CT CAP- 1. Status post radical prostatectomy. No findings to suggest local recurrence of disease or definite extraskeletal metastatic disease in the chest, abdomen or pelvis. However, there are several osseous lesions, as above, concerning for metastatic disease to the bones. 2. Hepatic steatosis. 3. Aortic atherosclerosis, in addition to 2 vessel coronary  artery disease. Please note that although the presence of coronary artery calcium documents the presence of coronary artery disease, the severity of this disease and any potential stenosis cannot be assessed on this non-gated CT examination. Assessment for potential risk factor modification, dietary therapy or pharmacologic therapy may be warranted, if clinically indicated. 4. Additional incidental findings, as above.         INTERVAL HISTORY:  Hunter Jones 61 y.o. male returns for routine follow-up for metastatic prostate cancer.   Overall, he tells me he has been feeling well.  Appetite 100%; energy levels 75%.  Denies any pain. Denies any new focal bone pain or arthralgias.  States that he feels like he is tolerating the Lupron injections pretty well; does endorse mood swings/emotional lability, as well as hot flashes.  These symptoms are currently manageable.    Endorses nocturia every 2 hours. Denies any dysuria or hematuria. States that he had an episode of hematuria several months ago, but was self-limiting and resolved.  No recurrent hematuria episodes since that time.    His PCP is Dr. Manuella Ghazi in [place]; he sees him regularly. States that he has chronic breathing problems with some element of asthma and COPD; hot weather makes his breathing worse. Reports chronic dizziness and easy bleeding/bruising, although denies any frank bleeding episodes or abnormal bruising (truncal bruising, etc).    States that he saw his dentist, Dr. Laretta Alstrom, with [facility] recently. Reports "she told me I was fine to  start that other shot you all want me to be on for my bones."  States that he believes this documentation was sent to our office; to my knowledge we do not have this documentation but will obtain it.   Otherwise, he is largely without other complaints today.  Last saw his urologist, Dr. Diona Fanti in 12/2016.     REVIEW OF SYSTEMS:  Review of Systems  Constitutional: Positive for  fatigue. Negative for chills and fever.  HENT:  Negative.   Eyes: Negative.   Respiratory: Positive for shortness of breath (chronic).   Cardiovascular: Positive for leg swelling.  Gastrointestinal: Negative.  Negative for abdominal pain, blood in stool, constipation, diarrhea, nausea and vomiting.  Endocrine: Positive for hot flashes.  Genitourinary: Positive for hematuria (in past; none recently ) and nocturia. Negative for dysuria.   Musculoskeletal: Negative for arthralgias and back pain.  Skin: Negative.   Neurological: Positive for dizziness.  Hematological: Bruises/bleeds easily.  Psychiatric/Behavioral: Negative.      PAST MEDICAL/SURGICAL HISTORY:  Past Medical History:  Diagnosis Date  . COPD (chronic obstructive pulmonary disease) ([place])   . Coronary artery disease   . Hypertension   . Hyperthyroidism   . Myocardial infarction [facility]) 2014   followed by Dr. Hamilton Capri / cardiologist - Linna Hoff  . Prostate cancer ([place])   . Prostate cancer ([place]) 06/27/2014  . Shortness of breath dyspnea    with exertion   Past Surgical History:  Procedure Laterality Date  . APPENDECTOMY    . LYMPHADENECTOMY Bilateral 06/27/2014   Procedure: LYMPHADENECTOMY;  Surgeon: Raynelle Bring, MD;  Location: [facility];  Service: Urology;  Laterality: Bilateral;  . ROBOT ASSISTED LAPAROSCOPIC RADICAL PROSTATECTOMY N/A 06/27/2014   Procedure: ROBOTIC ASSISTED LAPAROSCOPIC RADICAL PROSTATECTOMY LEVEL 3;  Surgeon: Raynelle Bring, MD;  Location: [facility];  Service: Urology;  Laterality: N/A;  . stented coronary artery  01/2013   2 stents  . TONSILLECTOMY       SOCIAL HISTORY:  Social History   Social History  . Marital status: Married    Spouse name: N/A  . Number of children: N/A  . Years of education: N/A   Occupational History  . Not on file.   Social History Main Topics  . Smoking status: Former Smoker    Years: 40.00    Types: Cigarettes    Quit date: 06/21/2012  . Smokeless tobacco: Never  Used  . Alcohol use Yes     Comment: occasional  . Drug use: No  . Sexual activity: Not on file   Other Topics Concern  . Not on file   Social History Narrative  . No narrative on file    FAMILY HISTORY:  Family History  Problem Relation Age of Onset  . Dementia Mother   . Thyroid disease Mother   . Kidney Stones Father   . Stroke Brother     CURRENT MEDICATIONS:  Outpatient Encounter Prescriptions as of 04/08/2017  Medication Sig  . albuterol (PROVENTIL HFA;VENTOLIN HFA) 108 (90 BASE) MCG/ACT inhaler Inhale 1-2 puffs into the lungs every 6 (six) hours as needed for wheezing or shortness of breath.  [name] Kitchen atorvastatin (LIPITOR) 40 MG tablet Take 40 mg by mouth at bedtime.  [name] Kitchen co-enzyme Q-10 30 MG capsule Take 30 mg by mouth 3 (three) times daily.  . fluticasone-salmeterol (ADVAIR HFA) 115-21 MCG/ACT inhaler Inhale 2 puffs into the lungs.  [name] Kitchen KRILL OIL PO Take by mouth.  . losartan (COZAAR) 25 MG tablet Take 25 mg by mouth daily.  [name] Kitchen  methimazole (TAPAZOLE) 10 MG tablet Take 10 mg by mouth every other day.   . metoprolol succinate (TOPROL-XL) 50 MG 24 hr tablet Take 50 mg by mouth every morning. Take with or immediately following a meal.  . Multiple Vitamin (MULTIVITAMIN) tablet Take 1 tablet by mouth daily.  . [DISCONTINUED] furosemide (LASIX) 20 MG tablet Take 1 tablet (20 mg total) by mouth daily. (Patient not taking: Reported on 04/08/2017)  . [DISCONTINUED] oxybutynin (DITROPAN XL) 5 MG 24 hr tablet Take 1 tablet (5 mg total) by mouth at bedtime. (Patient not taking: Reported on 04/08/2017)   No facility-administered encounter medications on file as of 04/08/2017.     ALLERGIES:  Allergies  Allergen Reactions  . Penicillins Hives     PHYSICAL EXAM:  ECOG Performance status: 1 - Symptomatic; remains independent   Vitals:   04/08/17 0938  BP: (!) 127/53  Pulse: 85  Resp: 18  Temp: 98.3 F (36.8 C)  SpO2: 99%   Filed Weights   04/08/17 0938  Weight: 291 lb 12.8  oz (132.4 kg)    Physical Exam  Constitutional: He is oriented to person, place, and time and well-developed, well-nourished, and in no distress.  HENT:  Head: Normocephalic.  Mouth/Throat: Oropharynx is clear and moist. No oropharyngeal exudate.  Eyes: Pupils are equal, round, and reactive to light. Conjunctivae are normal. No scleral icterus.  Neck: Normal range of motion. Neck supple.  Cardiovascular: Normal rate and regular rhythm.   Pulmonary/Chest: Effort normal and breath sounds normal. No respiratory distress.  Abdominal: Soft. Bowel sounds are normal. There is no tenderness.  Musculoskeletal: Normal range of motion. He exhibits edema (Trace ankle edema bilat).  Lymphadenopathy:    He has no cervical adenopathy.       Right: No supraclavicular adenopathy present.       Left: No supraclavicular adenopathy present.  Neurological: He is alert and oriented to person, place, and time. No cranial nerve deficit. Gait normal.  Skin: Skin is warm and dry. No rash noted.  Psychiatric: Mood, memory, affect and judgment normal.  Nursing note and vitals reviewed.    LABORATORY DATA:  I have reviewed the labs as listed.  CBC    Component Value Date/Time   WBC 7.3 04/08/2017 0857   RBC 4.08 (L) 04/08/2017 0857   HGB 14.0 04/08/2017 0857   HCT 40.2 04/08/2017 0857   PLT 144 (L) 04/08/2017 0857   MCV 98.5 04/08/2017 0857   MCH 34.3 (H) 04/08/2017 0857   MCHC 34.8 04/08/2017 0857   RDW 13.2 04/08/2017 0857   LYMPHSABS 2.5 04/08/2017 0857   MONOABS 0.4 04/08/2017 0857   EOSABS 0.3 04/08/2017 0857   BASOSABS 0.0 04/08/2017 0857   CMP Latest Ref Rng & Units 04/08/2017 11/09/2016 06/21/2014  Glucose 65 - 99 mg/dL 754(H) 606(V) 95  BUN 6 - 20 mg/dL 70(H) 40(B) 22  Creatinine 0.61 - 1.24 mg/dL 5.24 8.18 5.90  Sodium 135 - 145 mmol/L 136 141 142  Potassium 3.5 - 5.1 mmol/L 4.7 4.6 4.7  Chloride 101 - 111 mmol/L 106 106 107  CO2 22 - 32 mmol/L 21(L) 26 27  Calcium 8.9 - 10.3 mg/dL 9.1  9.0 9.5  Total Protein 6.5 - 8.1 g/dL 6.8 6.5 -  Total Bilirubin 0.3 - 1.2 mg/dL 0.6 0.7 -  Alkaline Phos 38 - 126 U/L 154(H) 127(H) -  AST 15 - 41 U/L 23 31 -  ALT 17 - 63 U/L 28 35 -     PENDING  LABS:  PSA and testosterone pending      DIAGNOSTIC IMAGING:  *The following radiologic images and reports have been reviewed independently and agree with below findings.  DEXA scan: 12/27/16 EXAM: DUAL X-RAY ABSORPTIOMETRY (DXA) FOR BONE MINERAL DENSITY  IMPRESSION: Ordering Physician:  Dr. [name],  Your patient Hunter Jones completed a BMD test on 12/27/2016 using the Taylor (software version: 14.10) manufactured by [employer]. The following summarizes the results of our evaluation. PATIENT BIOGRAPHICAL: Name: Hunter Jones, Hunter Jones Patient ID: [id number] Birth Date: 10/11/1955 Height: 71.0 in. Gender: Male Exam Date: 12/27/2016 Weight: 290.0 lbs. Indications: Caucasian, Height Loss, History of Fracture (Adult), Hyperparathyroid, Parent Hip Fracture, Prostate Cancer Fractures: Hip Treatments: Asprin, Multivitamin DENSITOMETRY RESULTS: Site         Region     Measured Date Measured Age WHO Classification Young Adult T-score BMD         %Change vs. Previous Significant Change (*) AP Spine L1-L3 12/27/[age] 62.6 N/A 1.6 1.398 g/cm2 - -  Left Femur Neck 12/27/2016 62.6 N/A -0.6 0.991 g/cm2 - -  Left Forearm Radius 33% 12/27/2016 62.6 N/A 0.0 0.804 g/cm2 - - ASSESSMENT: BMD as determined from Femur Neck is 0.991 g/cm2 with a T-Score of -0.6 is normal. Fracture risk is low.(L-4 was excluded due to advanced degenerative changes and rt. hip was not utilized due to prior hip fracture.) World Health Organization [facility]) criteria for post-menopausal, Caucasian Women: Normal:       T-score at or above -1 SD Osteopenia:   T-score between -1 and -2.5 SD Osteoporosis: T-score at or below -2.5 SD RECOMMENDATIONS: [facility]  recommends that FDA-approved medial therapies be considered in postmenopausal women and men age 40 or older with a: 1. Hip or vertberal (clinical or morphometric) fracture. 2. T-Score of < -2.5 at the spine or hip. 3. Ten-year fracture probability by FRAX of 3% or greater for hip fracture or 20% or greater for major osteoporotic fracture.   CT chest/abd/pelvis: 11/24/16 CLINICAL DATA:  61 year old male with history of prostate cancer with rising PSA levels, status post prostatectomy and radiation therapy. Evaluate for metastatic disease.  EXAM: CT CHEST, ABDOMEN, AND PELVIS WITH CONTRAST  TECHNIQUE: Multidetector CT imaging of the chest, abdomen and pelvis was performed following the standard protocol during bolus administration of intravenous contrast.  CONTRAST:  139m ISOVUE-300 IOPAMIDOL (ISOVUE-300) INJECTION 61%  COMPARISON:  CT pelvis 02/26/2015.  FINDINGS: CT CHEST FINDINGS  Cardiovascular: Heart size is normal. There is no significant pericardial fluid, thickening or pericardial calcification. There is aortic atherosclerosis, as well as atherosclerosis of the great vessels of the mediastinum and the coronary arteries, including calcified atherosclerotic plaque in the left anterior descending and right coronary arteries. Left anterior descending coronary artery stent incidentally noted.  Mediastinum/Nodes: No pathologically enlarged mediastinal or hilar lymph nodes. Esophagus is unremarkable in appearance. No axillary lymphadenopathy.  Lungs/Pleura: No acute consolidative airspace disease. No pleural effusions. No suspicious appearing pulmonary nodules or masses are noted. Mild diffuse bronchial wall thickening with very mild centrilobular and paraseptal emphysema.  Musculoskeletal: Subtle sclerotic lesion in the posterolateral aspect of the left seventh rib (axial image 35 of series 2) measuring 8 mm.  CT ABDOMEN PELVIS FINDINGS  Hepatobiliary:  Diffuse low attenuation throughout the hepatic parenchyma, compatible with hepatic steatosis. No cystic or solid hepatic lesions. No intra or extrahepatic biliary ductal dilatation. Gallbladder is normal in appearance.  Pancreas: No pancreatic mass. No pancreatic ductal dilatation. No pancreatic or peripancreatic  fluid or inflammatory changes.  Spleen: Unremarkable.  Adrenals/Urinary Tract: Bilateral kidneys and bilateral adrenal glands are normal in appearance. No hydroureteronephrosis. Urinary bladder is normal in appearance.  Stomach/Bowel: The appearance of the stomach is normal. There is no pathologic dilatation of small bowel or colon. Numerous colonic diverticulae are noted, without surrounding inflammatory changes to suggest an acute diverticulitis at this time. The appendix is not confidently identified and may be surgically absent. Regardless, there are no inflammatory changes noted adjacent to the cecum to suggest the presence of an acute appendicitis at this time.  Vascular/Lymphatic: Aortic atherosclerosis, without evidence of aneurysm or dissection in the abdominal or pelvic vasculature. No lymphadenopathy noted in the abdomen or pelvis.  Reproductive: Status post radical prostatectomy. No unexpected soft tissue mass noted in the prostatectomy bed to suggest locally recurrent disease.  Other: No significant volume of ascites.  No pneumoperitoneum.  Musculoskeletal: Large area of sclerosis in the S1 vertebra measuring up to 6.9 x 3.3 cm (axial image 104 of series 2), new compared to the prior study, suspicious for a bony metastasis. Subtle area of sclerosis in the right femoral head measuring 1 cm (axial image 125 of series 2), new compared to the prior study, likely a small metastatic lesion. 6 mm sclerotic lesion with narrow zone of transition in the right ilium immediately above the right acetabulum (axial image 116 of series 2), unchanged compared  to 2015, most compatible with a small bone island.  IMPRESSION: 1. Status post radical prostatectomy. No findings to suggest local recurrence of disease or definite extraskeletal metastatic disease in the chest, abdomen or pelvis. However, there are several osseous lesions, as above, concerning for metastatic disease to the bones. 2. Hepatic steatosis. 3. Aortic atherosclerosis, in addition to 2 vessel coronary artery disease. Please note that although the presence of coronary artery calcium documents the presence of coronary artery disease, the severity of this disease and any potential stenosis cannot be assessed on this non-gated CT examination. Assessment for potential risk factor modification, dietary therapy or pharmacologic therapy may be warranted, if clinically indicated. 4. Additional incidental findings, as above.   Electronically Signed   By: Vinnie Langton M.D.   On: 11/25/2016 08:24   Bone scan: 11/16/16 CLINICAL DATA:  Prostate malignancy diagnosed with in 2015. Patient is noted to have a rising PSA. The patient reports left wrist pain for the past week. No history of falls.  EXAM: NUCLEAR MEDICINE WHOLE BODY BONE SCAN  TECHNIQUE: Whole body anterior and posterior images were obtained approximately 3 hours after intravenous injection of radiopharmaceutical.  RADIOPHARMACEUTICALS:  Twenty-two mCi Technetium-53mMDP IV  COMPARISON:  Nuclear bone scan of May 03, 2014  FINDINGS: There is adequate uptake of the radiopharmaceutical by the skeleton. There is adequate soft tissue clearance and renal activity.  There is a punctate focus of increased uptake superior and lateral to the left orbit. Elsewhere calvarial uptake is normal. Uptake within the cervical and thoracic spine is normal. There is increased uptake associated with the first sacral segment which is new. There is minimal increased uptake in the posterior aspect of the left 6th rib  which is new. Uptake within the visualized portions of the upper extremities is within the limits of normal. Mildly increased uptake in the wrists and hands is likely degenerative in nature. There is minimal increased uptake in in both knees compatible with degenerative change.  IMPRESSION: New areas of increased uptake superolateral to the left orbit (faint), at S1, and in the  posterolateral aspect of the left sixth rib. These findings are worrisome for metastatic disease. Plain radiographs are recommended.  Limited visualization of the wrists. Mildly increased uptake within the visualized portions of both wrists is likely degenerative in nature.   Electronically Signed   By: David  Martinique M.D.   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 Underwent treatment with prostatectomy by Dr. Alinda Money on 06/27/14. He also had radiation therapy adjuvantly.  PSA began rising in 03/2016 with doubling-time of <6 months.  PSA in 03/2016 0.58, then 1.6 in 06/2016, then 3.68 in 10/2016. Restaging bone scan on 11/16/16 revealed new areas of increased uptake superior lateral to the left orbit which was faint, at S1, and in the posterior lateral aspect of the left sixth rib; these findings were worrisome for metastatic disease.  Restaging CT chest/abdomen/pelvis performed on 11/24/16 revealed no findings suggestive of local recurrence of disease or definitive extraskeletal metastatic disease in the chest, abdomen, or pelvis.  There were several notable osseous lesions concerning for metastatic disease in the bones.  No other documented evidence of visceral metastatic disease. -On Lupron 22.5 mg every 3 months.  Tolerating androgen-deprivation therapy relatively  well; endorses hot flashes and mood swings, which are stable.   -Return to cancer center in 3 months for follow-up with labs and next Lupron injection.   Bone mets/Bone health:  -DEXA scan done on 12/27/16 revealed normal bone density with T-score -0.6.  -Mildly elevated alk phos 154 today; likely secondary to bone mets.  -Reviewed previous recommendations with him on considering starting Xgeva therapy for bone mets to help reduce skeletal-related events. However, there is reportedly limited data that shows benefit for patients with low-volume castrate-sensitive disease.   -He agrees to proceed with Xgeva. Treatment plan built. We will try to get him started next month. States that he had dentist visit with Dr. Laretta Alstrom, DDS in [place] recently and she cleared him for therapy; we will call their office to verify as we do not have documentation of this dental clearance. He is worried about insurance covering this injection; explained to him that our patient advocate, Durene Cal, would help get prior auth for this medication if needed.  He would like to start Xgeva before the end of the calendar year as he has met his out-of-pocket maximum for this year.  -Recommended calcium/vitamin D supplementation and increase weight-bearing exercise as tolerated.   Mild thrombocytopenia:  -Improved from previous. Platelets 144,000 today. No active bleeding episodes or abnormal bruising reported. Will continue to monitor.   Health maintenance:  -Already received flu shot for this year. Sees his PCP, Dr. Manuella Ghazi, in [place] as directed.  Encouraged continued follow-up with PCP and other specialists as directed.       Dispo:  -Start Xgeva in 04/2017; treatment plan built. Will send message to Durene Cal to see if we need insurance prior auth. Will ask nursing to contact patient's dentist for dental clearance for Xgeva.  Will receive Xgeva monthly.  -Standing orders for monthly labs placed today with Xgeva  injections.  -Return to cancer center in 3 months for follow-up with labs, next Lupron injection and subsequent Xgeva injection.    All questions were answered to patient's stated satisfaction. Encouraged patient  to call with any new concerns or questions before his next visit to the cancer center and we can certain see him sooner, if needed.    Plan of care discussed with Dr. Oliva Bustard, who agrees with the above aforementioned.    Orders placed this encounter:  Orders Placed This Encounter  Procedures  . CBC with Differential/Platelet  . Comprehensive metabolic panel  . PSA  . Testosterone      Hunter Craze, NP [place] [phone]

## 2017-04-08 ENCOUNTER — Encounter (HOSPITAL_COMMUNITY): Payer: BLUE CROSS/BLUE SHIELD | Attending: Oncology

## 2017-04-08 ENCOUNTER — Encounter (HOSPITAL_BASED_OUTPATIENT_CLINIC_OR_DEPARTMENT_OTHER): Payer: BLUE CROSS/BLUE SHIELD

## 2017-04-08 ENCOUNTER — Encounter (HOSPITAL_BASED_OUTPATIENT_CLINIC_OR_DEPARTMENT_OTHER): Payer: BLUE CROSS/BLUE SHIELD | Admitting: Adult Health

## 2017-04-08 ENCOUNTER — Encounter (HOSPITAL_COMMUNITY): Payer: Self-pay | Admitting: Adult Health

## 2017-04-08 ENCOUNTER — Other Ambulatory Visit (HOSPITAL_COMMUNITY): Payer: Self-pay | Admitting: Adult Health

## 2017-04-08 VITALS — BP 127/53 | HR 85 | Temp 98.3°F | Resp 18 | Wt 291.8 lb

## 2017-04-08 DIAGNOSIS — D696 Thrombocytopenia, unspecified: Secondary | ICD-10-CM | POA: Diagnosis not present

## 2017-04-08 DIAGNOSIS — C61 Malignant neoplasm of prostate: Secondary | ICD-10-CM | POA: Diagnosis present

## 2017-04-08 DIAGNOSIS — Z5111 Encounter for antineoplastic chemotherapy: Secondary | ICD-10-CM

## 2017-04-08 DIAGNOSIS — C7951 Secondary malignant neoplasm of bone: Secondary | ICD-10-CM | POA: Insufficient documentation

## 2017-04-08 LAB — COMPREHENSIVE METABOLIC PANEL
ALK PHOS: 154 U/L — AB (ref 38–126)
ALT: 28 U/L (ref 17–63)
ANION GAP: 9 (ref 5–15)
AST: 23 U/L (ref 15–41)
Albumin: 3.7 g/dL (ref 3.5–5.0)
BILIRUBIN TOTAL: 0.6 mg/dL (ref 0.3–1.2)
BUN: 26 mg/dL — ABNORMAL HIGH (ref 6–20)
CALCIUM: 9.1 mg/dL (ref 8.9–10.3)
CO2: 21 mmol/L — ABNORMAL LOW (ref 22–32)
Chloride: 106 mmol/L (ref 101–111)
Creatinine, Ser: 1.04 mg/dL (ref 0.61–1.24)
Glucose, Bld: 181 mg/dL — ABNORMAL HIGH (ref 65–99)
POTASSIUM: 4.7 mmol/L (ref 3.5–5.1)
Sodium: 136 mmol/L (ref 135–145)
TOTAL PROTEIN: 6.8 g/dL (ref 6.5–8.1)

## 2017-04-08 LAB — CBC WITH DIFFERENTIAL/PLATELET
Basophils Absolute: 0 10*3/uL (ref 0.0–0.1)
Basophils Relative: 0 %
Eosinophils Absolute: 0.3 10*3/uL (ref 0.0–0.7)
Eosinophils Relative: 5 %
HEMATOCRIT: 40.2 % (ref 39.0–52.0)
HEMOGLOBIN: 14 g/dL (ref 13.0–17.0)
LYMPHS ABS: 2.5 10*3/uL (ref 0.7–4.0)
Lymphocytes Relative: 35 %
MCH: 34.3 pg — AB (ref 26.0–34.0)
MCHC: 34.8 g/dL (ref 30.0–36.0)
MCV: 98.5 fL (ref 78.0–100.0)
MONO ABS: 0.4 10*3/uL (ref 0.1–1.0)
MONOS PCT: 5 %
NEUTROS ABS: 4.1 10*3/uL (ref 1.7–7.7)
NEUTROS PCT: 55 %
Platelets: 144 10*3/uL — ABNORMAL LOW (ref 150–400)
RBC: 4.08 MIL/uL — ABNORMAL LOW (ref 4.22–5.81)
RDW: 13.2 % (ref 11.5–15.5)
WBC: 7.3 10*3/uL (ref 4.0–10.5)

## 2017-04-08 LAB — PSA: PROSTATIC SPECIFIC ANTIGEN: 3.79 ng/mL (ref 0.00–4.00)

## 2017-04-08 MED ORDER — LEUPROLIDE ACETATE (3 MONTH) 22.5 MG IM KIT
22.5000 mg | PACK | Freq: Once | INTRAMUSCULAR | Status: AC
  Administered 2017-04-08: 22.5 mg via INTRAMUSCULAR
  Filled 2017-04-08: qty 22.5

## 2017-04-08 NOTE — Patient Instructions (Addendum)
[place] at [facility] Discharge Instructions  RECOMMENDATIONS MADE BY THE CONSULTANT AND ANY TEST RESULTS WILL BE SENT TO YOUR REFERRING PHYSICIAN.  You saw Mike Craze, NP, today. Follow up and labs in 3 months. Delton See will start in November and will be monthly. Continue Lupron every 3 months. See Andee Poles at checkout for appointments.   Thank you for choosing [place] at [facility] to provide your oncology and hematology care.  To afford each patient quality time with our provider, please arrive at least 15 minutes before your scheduled appointment time.    If you have a lab appointment with the [place] [facility] please come in thru the  Main Entrance and check in at the main information desk  You need to re-schedule your appointment should you arrive 10 or more minutes late.  We strive to give you quality time with our providers, and arriving late affects you and other patients whose appointments are after yours.  Also, if you no show three or more times for appointments you may be dismissed from the clinic at the providers discretion.     Again, thank you for choosing [facility].  Our hope is that these requests will decrease the amount of time that you wait before being seen by our physicians.       _____________________________________________________________  Should you have questions after your visit to [facility], please contact our office at [phone] between the hours of 8:30 a.m. and 4:30 p.m.  Voicemails left after 4:30 p.m. will not be returned until the following business day.  For prescription refill requests, have your pharmacy contact our office.       Resources For Cancer Patients and their Caregivers ? American Cancer Society: Can assist with transportation, wigs, general needs, runs Look Good Feel Better.        4106743891 ? Cancer Care: Provides financial assistance, online  support groups, medication/co-pay assistance.  1-800-813-HOPE 984-557-5123) ? [place] Assists [place] cancer patients and their families through emotional , educational and financial support.  316-431-3103 ? [place] DSS Where to apply for food stamps, Medicaid and utility assistance. (304)456-3254 ? RCATS: Transportation to medical appointments. (667) 035-0257 ? Social Security Administration: May apply for disability if have a Stage IV cancer. (704)484-5768 (724)728-4329 ? [employer], Disability and Transit Services: Assists with nutrition, care and transit needs. [place] Support Programs: '@10RELATIVEDAYS'$ @ > Cancer Support Group  2nd Tuesday of the month 1pm-2pm, Journey Room  > Creative Journey  3rd Tuesday of the month 1130am-1pm, Journey Room  > Look Good Feel Better  1st Wednesday of the month 10am-12 noon, Journey Room (Call American Cancer Society to register [phone])   Denosumab injection Delton See) What is this medicine? DENOSUMAB (den oh sue mab) slows bone breakdown. Prolia is used to treat osteoporosis in women after menopause and in men. Delton See is used to treat a high calcium level due to cancer and to prevent bone fractures and other bone problems caused by multiple myeloma or cancer bone metastases. Delton See is also used to treat giant cell tumor of the bone. This medicine may be used for other purposes; ask your health care provider or pharmacist if you have questions. COMMON BRAND NAME(S): Prolia, XGEVA What should I tell my health care provider before I take this medicine? They need to know if you have any of these conditions: -dental disease -having surgery or tooth  extraction -infection -kidney disease -low levels of calcium or Vitamin D in the blood -malnutrition -on hemodialysis -skin conditions or sensitivity -thyroid or parathyroid disease -an unusual reaction to denosumab, other medicines, foods,  dyes, or preservatives -pregnant or trying to get pregnant -breast-feeding How should I use this medicine? This medicine is for injection under the skin. It is given by a health care professional in a hospital or clinic setting. If you are getting Prolia, a special MedGuide will be given to you by the pharmacist with each prescription and refill. Be sure to read this information carefully each time. For Prolia, talk to your pediatrician regarding the use of this medicine in children. Special care may be needed. For Delton See, talk to your pediatrician regarding the use of this medicine in children. While this drug may be prescribed for children as young as 13 years for selected conditions, precautions do apply. Overdosage: If you think you have taken too much of this medicine contact a poison control center or emergency room at once. NOTE: This medicine is only for you. Do not share this medicine with others. What if I miss a dose? It is important not to miss your dose. Call your doctor or health care professional if you are unable to keep an appointment. What may interact with this medicine? Do not take this medicine with any of the following medications: -other medicines containing denosumab This medicine may also interact with the following medications: -medicines that lower your chance of fighting infection -steroid medicines like prednisone or cortisone This list may not describe all possible interactions. Give your health care provider a list of all the medicines, herbs, non-prescription drugs, or dietary supplements you use. Also tell them if you smoke, drink alcohol, or use illegal drugs. Some items may interact with your medicine. What should I watch for while using this medicine? Visit your doctor or health care professional for regular checks on your progress. Your doctor or health care professional may order blood tests and other tests to see how you are doing. Call your doctor or health  care professional for advice if you get a fever, chills or sore throat, or other symptoms of a cold or flu. Do not treat yourself. This drug may decrease your body's ability to fight infection. Try to avoid being around people who are sick. You should make sure you get enough calcium and vitamin D while you are taking this medicine, unless your doctor tells you not to. Discuss the foods you eat and the vitamins you take with your health care professional. See your dentist regularly. Brush and floss your teeth as directed. Before you have any dental work done, tell your dentist you are receiving this medicine. Do not become pregnant while taking this medicine or for 5 months after stopping it. Talk with your doctor or health care professional about your birth control options while taking this medicine. Women should inform their doctor if they wish to become pregnant or think they might be pregnant. There is a potential for serious side effects to an unborn child. Talk to your health care professional or pharmacist for more information. What side effects may I notice from receiving this medicine? Side effects that you should report to your doctor or health care professional as soon as possible: -allergic reactions like skin rash, itching or hives, swelling of the face, lips, or tongue -bone pain -breathing problems -dizziness -jaw pain, especially after dental work -redness, blistering, peeling of the skin -signs and symptoms of  infection like fever or chills; cough; sore throat; pain or trouble passing urine -signs of low calcium like fast heartbeat, muscle cramps or muscle pain; pain, tingling, numbness in the hands or feet; seizures -unusual bleeding or bruising -unusually weak or tired Side effects that usually do not require medical attention (report to your doctor or health care professional if they continue or are bothersome): -constipation -diarrhea -headache -joint pain -loss of  appetite -muscle pain -runny nose -tiredness -upset stomach This list may not describe all possible side effects. Call your doctor for medical advice about side effects. You may report side effects to FDA at 1-800-FDA-1088. Where should I keep my medicine? This medicine is only given in a clinic, doctor's office, or other health care setting and will not be stored at home. NOTE: This sheet is a summary. It may not cover all possible information. If you have questions about this medicine, talk to your doctor, pharmacist, or health care provider.  2018 Elsevier/Gold Standard (2016-06-22 19:17:21)

## 2017-04-08 NOTE — Patient Instructions (Signed)
[place] at [facility]  Discharge Instructions:  You received a lupron shot today.  Keep next scheduled appointment and call for any concerns or questions.  _______________________________________________________________  Thank you for choosing [place] at [facility] to provide your oncology and hematology care.  To afford each patient quality time with our providers, please arrive at least 15 minutes before your scheduled appointment.  You need to re-schedule your appointment if you arrive 10 or more minutes late.  We strive to give you quality time with our providers, and arriving late affects you and other patients whose appointments are after yours.  Also, if you no show three or more times for appointments you may be dismissed from the clinic.  Again, thank you for choosing [place] at [place] hope is that these requests will allow you access to exceptional care and in a timely manner. _______________________________________________________________  If you have questions after your visit, please contact our office at [phone] between the hours of 8:30 a.m. and 5:00 p.m. Voicemails left after 4:30 p.m. will not be returned until the following business day. _______________________________________________________________  For prescription refill requests, have your pharmacy contact our office. _______________________________________________________________  Recommendations made by the consultant and any test results will be sent to your referring physician. _______________________________________________________________

## 2017-04-08 NOTE — Progress Notes (Signed)
Patient tolerated lupron shot with no complaints of pain voiced.  Injection site clean and dry with no bruising or swelling noted at site.  Band aid applied.  VSS with discharge and left ambulatory with no s/s of distress noted.

## 2017-04-09 LAB — TESTOSTERONE: Testosterone: 116 ng/dL — ABNORMAL LOW (ref 264–916)

## 2017-04-12 ENCOUNTER — Ambulatory Visit (HOSPITAL_COMMUNITY): Payer: BLUE CROSS/BLUE SHIELD

## 2017-04-12 ENCOUNTER — Telehealth (HOSPITAL_COMMUNITY): Payer: Self-pay

## 2017-04-12 ENCOUNTER — Other Ambulatory Visit (HOSPITAL_COMMUNITY): Payer: Self-pay | Admitting: Adult Health

## 2017-04-12 ENCOUNTER — Telehealth (HOSPITAL_COMMUNITY): Payer: Self-pay | Admitting: *Deleted

## 2017-04-12 ENCOUNTER — Ambulatory Visit (HOSPITAL_COMMUNITY): Payer: BLUE CROSS/BLUE SHIELD | Admitting: Adult Health

## 2017-04-12 ENCOUNTER — Other Ambulatory Visit (HOSPITAL_COMMUNITY): Payer: BLUE CROSS/BLUE SHIELD

## 2017-04-12 NOTE — Telephone Encounter (Signed)
RE: New start: Xgeva & Need dental clearance  Received: Today  Message Contents  Donetta Potts, RN  Darlina Guys, LPN; Holley Bouche, NP; Mamie Nick Onc Nurse Ap        I spoke with Dr. Luan Pulling office this morning. They recommend that the patient continue cleanings every 3 months, the patient due to financial reasons wants to have them every 6 months. As far as starting the Xgeva, Dr. Luan Pulling states that he is cleared from a dental standpoint. They are going to fax [place] this clearance in writing.   Previous Messages    ----- Message -----  From: Darlina Guys, LPN  Sent: 82/99/3716 10:58 AM  To: Holley Bouche, NP, Onc Nurse Ap  Subject: RE: New startDelton See & Need dental clearance   Dr. Luan Pulling office is closed on Friday. Will send message to the onc pool so someone can check on it Monday.   MB   Dr Luan Pulling number is [phone].  ----- Message -----  From: Holley Bouche, NP  Sent: 04/08/2017 10:18 AM  To: Otto Herb, LPN  Subject: New startDelton See & Need dental clearance     Would like to start Mr. Rauh on [place] beginning in November; does he need prior auth?   Sharyn Lull, can you help me contact patient's dentist? Her name is Dr. Laretta Alstrom in [place]; pt thinks she is at [facility], but is not sure this is the name of the practice. We just need documented dental clearance before we can start Xgeva.    Thanks all!  Elzie Rings

## 2017-04-12 NOTE — Telephone Encounter (Signed)
I spoke with Dr. Luan Pulling office this morning. They recommend that the patient continue cleanings every 3 months, the patient due to financial reasons wants to have them every 6 months. As far as starting the Xgeva, Dr. Luan Pulling states that he is cleared from a dental standpoint. They are going to fax [place] this clearance in writing.

## 2017-05-13 ENCOUNTER — Ambulatory Visit (HOSPITAL_COMMUNITY): Payer: BLUE CROSS/BLUE SHIELD

## 2017-05-13 ENCOUNTER — Other Ambulatory Visit (HOSPITAL_COMMUNITY): Payer: BLUE CROSS/BLUE SHIELD

## 2017-05-25 ENCOUNTER — Other Ambulatory Visit (HOSPITAL_COMMUNITY): Payer: BLUE CROSS/BLUE SHIELD

## 2017-05-25 ENCOUNTER — Ambulatory Visit (HOSPITAL_COMMUNITY): Payer: BLUE CROSS/BLUE SHIELD

## 2017-05-27 ENCOUNTER — Telehealth: Payer: Self-pay | Admitting: Cardiology

## 2017-05-27 ENCOUNTER — Encounter: Payer: Self-pay | Admitting: Cardiology

## 2017-05-27 ENCOUNTER — Ambulatory Visit: Payer: BLUE CROSS/BLUE SHIELD | Admitting: Cardiology

## 2017-05-27 VITALS — BP 118/60 | HR 94 | Ht 71.0 in | Wt 295.0 lb

## 2017-05-27 DIAGNOSIS — J449 Chronic obstructive pulmonary disease, unspecified: Secondary | ICD-10-CM | POA: Diagnosis not present

## 2017-05-27 DIAGNOSIS — I10 Essential (primary) hypertension: Secondary | ICD-10-CM

## 2017-05-27 DIAGNOSIS — R0609 Other forms of dyspnea: Secondary | ICD-10-CM

## 2017-05-27 DIAGNOSIS — I25119 Atherosclerotic heart disease of native coronary artery with unspecified angina pectoris: Secondary | ICD-10-CM

## 2017-05-27 DIAGNOSIS — E782 Mixed hyperlipidemia: Secondary | ICD-10-CM | POA: Diagnosis not present

## 2017-05-27 NOTE — Progress Notes (Signed)
Cardiology Office Note  Date: 05/27/2017   ID: OLUWAFERANMI Jones, DOB [birth year], MRN [id number]  PCP: Hunter Blitz, MD  Consulting Cardiologist: Hunter Lesches, MD   Chief Complaint  Patient presents with  . Coronary Artery Disease  . Dyspnea on exertion   - History of Present Illness: Hunter Jones is a 61 y.o. male former patient of Hunter Jones with the [facility] cardiology practice, now presenting to establish cardiology follow-up with our group.  I reviewed extensive records and updated the chart.  He last saw Hunter Jones in May of this year and underwent an exercise echocardiogram at that time that was negative for ischemia.  He presents today stating that over the last few months he has had increasing dyspnea on exertion when he goes outside to walk his dog.  He has not been certain whether the symptoms are more related to COPD or his cardiac status.  He does relate having dyspnea on exertion prior to his cardiac presentation in 2014.  He works as a Administrator.  States that he takes his medications regularly as outlined below.  He has not used and nitroglycerin.  Concurrently, he is also on Lupron for treatment of prostate cancer.  I personally reviewed his ECG today which shows normal sinus rhythm.  Past Medical History:  Diagnosis Date  . COPD (chronic obstructive pulmonary disease) ([place])   . Coronary artery disease    DES to mid LAD August 2014 ([facility])  . Essential hypertension   . Hyperthyroidism   . NSTEMI (non-ST elevated myocardial infarction) ([place]) 2014  . Prostate cancer [facility])     Past Surgical History:  Procedure Laterality Date  . APPENDECTOMY    . LYMPHADENECTOMY Bilateral 06/27/2014   Procedure: LYMPHADENECTOMY;  Surgeon: Hunter Bring, MD;  Location: [facility];  Service: Urology;  Laterality: Bilateral;  . ROBOT ASSISTED LAPAROSCOPIC RADICAL PROSTATECTOMY N/A 06/27/2014   Procedure: ROBOTIC ASSISTED LAPAROSCOPIC RADICAL PROSTATECTOMY LEVEL 3;  Surgeon:  Hunter Bring, MD;  Location: [facility];  Service: Urology;  Laterality: N/A;  . TONSILLECTOMY      Current Outpatient Medications  Medication Sig Dispense Refill  . albuterol (PROVENTIL HFA;VENTOLIN HFA) 108 (90 BASE) MCG/ACT inhaler Inhale 1-2 puffs into the lungs every 6 (six) hours as needed for wheezing or shortness of breath.    [name] Kitchen atorvastatin (LIPITOR) 40 MG tablet Take 40 mg by mouth at bedtime.    [name] Kitchen Co-Enzyme Q-10 100 MG CAPS Take 1 capsule by mouth daily.    . fluticasone-salmeterol (ADVAIR HFA) 115-21 MCG/ACT inhaler Inhale 2 puffs into the lungs 2 (two) times daily.     [name] Kitchen KRILL OIL PO Take 1 capsule by mouth daily.     [name] Kitchen Leuprolide Acetate, 3 Month, (LUPRON DEPOT, 5-MONTH, IM) Inject into the muscle every 3 (three) months.    [name] Kitchen losartan (COZAAR) 25 MG tablet Take 25 mg by mouth daily.    . methimazole (TAPAZOLE) 10 MG tablet Take 10 mg by mouth every other day.     . metoprolol succinate (TOPROL-XL) 50 MG 24 hr tablet Take 50 mg by mouth every morning. Take with or immediately following a meal.    . Multiple Vitamin (MULTIVITAMIN) tablet Take 1 tablet by mouth daily.     No current facility-administered medications for this visit.    Allergies:  Penicillins   Social History: The patient  reports that he quit smoking about 4 years ago. His smoking use included cigarettes. He quit after 40.00 years of use.  he has never used smokeless tobacco. He reports that he drinks alcohol. He reports that he does not use drugs.   Family History: The patient's family history includes Dementia in his mother; Kidney Stones in his father; Stroke in his brother; Thyroid disease in his mother.   ROS:  Please see the history of present illness. Otherwise, complete review of systems is positive for none.  All other systems are reviewed and negative.   Physical Exam: VS:  BP 118/60   Pulse 94   Ht 5\' 11"  (1.803 m)   Wt 295 lb (133.8 kg)   SpO2 96%   BMI 41.14 kg/m , BMI Body mass index is 41.14  kg/m.  Wt Readings from Last 3 Encounters:  05/27/17 295 lb (133.8 kg)  04/08/17 291 lb 12.8 oz (132.4 kg)  12/21/16 292 lb (132.5 kg)    General: Morbidly obese male, appears comfortable at rest. HEENT: Conjunctiva and lids normal, oropharynx clear. Neck: Supple, no elevated JVP or carotid bruits, no thyromegaly. Lungs: No wheezing or rhonchi, nonlabored breathing at rest. Cardiac: Regular rate and rhythm, no S3 or significant systolic murmur, no pericardial rub. Abdomen: Obese, nontender, bowel sounds present, no guarding or rebound. Extremities: Mild ankle edema, distal pulses 2+. Skin: Warm and dry. Musculoskeletal: No kyphosis. Neuropsychiatric: Alert and oriented x3, affect grossly appropriate.  ECG: No previous tracing available at this time for review.  Recent Labwork: 04/08/2017: ALT 28; AST 23; BUN 26; Creatinine, Ser 1.04; Hemoglobin 14.0; Platelets 144; Potassium 4.7; Sodium 136   Other Studies Reviewed Today:  Exercise echocardiogram 10/14/2016 ([facility]): No diagnostic ST segment changes at 89% MPHR, no chest pain reported.  Hypertensive response noted.  No inducible wall motion abnormalities to indicate ischemia.  Cardiac catheterization and PCI 02/04/2013 [facility]): Normal left main, 99% mid LAD stenosis, nondominant circumflex with 2 obtuse marginals without stenosis, dominant RCA with 20% stenosis.  Patient underwent placement of 3.0 x 28 mm Promus DES.  Assessment and Plan:  1.  History of CAD with DES to the mid LAD in 2014 as outlined above.  He presents to establish follow-up with our practice reporting a few month history of increasing dyspnea on exertion.  Baseline ECG shows no acute changes.  He did undergo an exercise echocardiogram with [facility] back in May of this year that was negative for ischemia, although states that the current symptoms are new since that time and do remind him somewhat of symptoms prior to stent intervention in 2014.  Plan is to continue  medical therapy and refer for a Lexiscan Myoview to reevaluate ischemic burden.  Determine disposition from there.  2.  Essential hypertension, blood pressure is well controlled today on current regimen which includes Cozaar and Toprol-XL.  3.  Hyperlipidemia, on Lipitor.  He follows with Dr. Manuella Ghazi.  4.  Morbid obesity, weight loss discussed.  5.  COPD, on Advair.  Also uses rescue inhaler occasionally.  Reports no consistent problems with wheezing.  Current medicines were reviewed with the patient today.   Orders Placed This Encounter  Procedures  . NM Myocar Multi W/Spect W/Wall Motion / EF  . EKG 12-Lead    Disposition: Call with test results.  Signed, Satira Sark, MD, [facility] 05/27/2017 3:29 PM    [place] [facility] [place], [place], [place] [address] Phone: [phone]; Fax: [phone]

## 2017-05-27 NOTE — Telephone Encounter (Signed)
Pre-cert Verification for the following procedure   Lexiscan scheduled for 07-10-16 at [facility].

## 2017-05-27 NOTE — Patient Instructions (Signed)
Medication Instructions:  Your physician recommends that you continue on your current medications as directed. Please refer to the Current Medication list given to you today.  Labwork: NONE  Testing/Procedures: Your physician has requested that you have a lexiscan myoview. For further information please visit [email or web address]. Please follow instruction sheet, as given.  Follow-Up: Your physician recommends that you schedule a follow-up appointment PENDING TEST RESULTS  Any Other Special Instructions Will Be Listed Below (If Applicable).  If you need a refill on your cardiac medications before your next appointment, please call your pharmacy. 

## 2017-06-01 ENCOUNTER — Encounter (HOSPITAL_COMMUNITY): Payer: BLUE CROSS/BLUE SHIELD

## 2017-06-01 ENCOUNTER — Encounter (HOSPITAL_COMMUNITY): Payer: Self-pay

## 2017-06-01 ENCOUNTER — Encounter (HOSPITAL_COMMUNITY): Payer: BLUE CROSS/BLUE SHIELD | Attending: Oncology

## 2017-06-01 VITALS — BP 152/74 | HR 99 | Temp 97.7°F | Resp 20

## 2017-06-01 DIAGNOSIS — C61 Malignant neoplasm of prostate: Secondary | ICD-10-CM | POA: Diagnosis not present

## 2017-06-01 DIAGNOSIS — C7951 Secondary malignant neoplasm of bone: Secondary | ICD-10-CM

## 2017-06-01 LAB — COMPREHENSIVE METABOLIC PANEL
ALBUMIN: 3.7 g/dL (ref 3.5–5.0)
ALT: 26 U/L (ref 17–63)
AST: 27 U/L (ref 15–41)
Alkaline Phosphatase: 131 U/L — ABNORMAL HIGH (ref 38–126)
Anion gap: 10 (ref 5–15)
BILIRUBIN TOTAL: 0.6 mg/dL (ref 0.3–1.2)
BUN: 31 mg/dL — AB (ref 6–20)
CHLORIDE: 110 mmol/L (ref 101–111)
CO2: 19 mmol/L — AB (ref 22–32)
Calcium: 9 mg/dL (ref 8.9–10.3)
Creatinine, Ser: 1.18 mg/dL (ref 0.61–1.24)
GFR calc Af Amer: >60 mL/min (ref >60)
GFR calc non Af Amer: >60 mL/min (ref >60)
GLUCOSE: 151 mg/dL — AB (ref 65–99)
POTASSIUM: 4.1 mmol/L (ref 3.5–5.1)
SODIUM: 139 mmol/L (ref 135–145)
Total Protein: 6.6 g/dL (ref 6.5–8.1)

## 2017-06-01 LAB — CBC WITH DIFFERENTIAL/PLATELET
BASOS ABS: 0 10*3/uL (ref 0.0–0.1)
BASOS PCT: 0 %
Eosinophils Absolute: 0.4 10*3/uL (ref 0.0–0.7)
Eosinophils Relative: 4 %
HEMATOCRIT: 38.9 % — AB (ref 39.0–52.0)
Hemoglobin: 13.2 g/dL (ref 13.0–17.0)
Lymphocytes Relative: 34 %
Lymphs Abs: 2.8 10*3/uL (ref 0.7–4.0)
MCH: 33.4 pg (ref 26.0–34.0)
MCHC: 33.9 g/dL (ref 30.0–36.0)
MCV: 98.5 fL (ref 78.0–100.0)
MONO ABS: 0.8 10*3/uL (ref 0.1–1.0)
Monocytes Relative: 9 %
NEUTROS ABS: 4.3 10*3/uL (ref 1.7–7.7)
Neutrophils Relative %: 53 %
PLATELETS: 147 10*3/uL — AB (ref 150–400)
RBC: 3.95 MIL/uL — ABNORMAL LOW (ref 4.22–5.81)
RDW: 12.7 % (ref 11.5–15.5)
WBC: 8.2 10*3/uL (ref 4.0–10.5)

## 2017-06-01 LAB — PSA: Prostatic Specific Antigen: 1.54 ng/mL (ref 0.00–4.00)

## 2017-06-01 MED ORDER — DENOSUMAB 120 MG/1.7ML ~~LOC~~ SOLN
120.0000 mg | Freq: Once | SUBCUTANEOUS | Status: AC
  Administered 2017-06-01: 120 mg via SUBCUTANEOUS
  Filled 2017-06-01: qty 1.7

## 2017-06-01 NOTE — Patient Instructions (Signed)
[facility] at [facility] Discharge Instructions  RECOMMENDATIONS MADE BY THE CONSULTANT AND ANY TEST RESULTS WILL BE SENT TO YOUR REFERRING PHYSICIAN.  Received Xgeva injection today. Follow-up as scheduled. Call clinic for any questions or concerns  Thank you for choosing  [facility] at [facility] to provide your oncology and hematology care.  To afford each patient quality time with our provider, please arrive at least 15 minutes before your scheduled appointment time.    If you have a lab appointment with the Cancer Center please come in thru the  Main Entrance and check in at the main information desk  You need to re-schedule your appointment should you arrive 10 or more minutes late.  We strive to give you quality time with our providers, and arriving late affects you and other patients whose appointments are after yours.  Also, if you no show three or more times for appointments you may be dismissed from the clinic at the providers discretion.     Again, thank you for choosing [facility].  Our hope is that these requests will decrease the amount of time that you wait before being seen by our physicians.       _____________________________________________________________  Should you have questions after your visit to [facility], please contact our office at [phone] between the hours of 8:30 a.m. and 4:30 p.m.  Voicemails left after 4:30 p.m. will not be returned until the following business day.  For prescription refill requests, have your pharmacy contact our office.       Resources For Cancer Patients and their Caregivers ? American Cancer Society: Can assist with transportation, wigs, general needs, runs Look Good Feel Better.        1-888-227-6333 ? Cancer Care: Provides financial assistance, online support groups, medication/co-pay assistance.  1-800-813-HOPE (4673) ? [facility] Assists [place] cancer patients and their families through emotional , educational and financial support.  336-427-4357 ? [place] DSS Where to apply for food stamps, Medicaid and utility assistance. 336-342-1394 ? RCATS: Transportation to medical appointments. 336-347-2287 ? Social Security Administration: May apply for disability if have a Stage IV cancer. 336-342-7796 1-800-772-1213 ? [place] Aging, Disability and Transit Services: Assists with nutrition, care and transit needs. 336-349-2343  Cancer Center Support Programs: @10RELATIVEDAYS@ > Cancer Support Group  2nd Tuesday of the month 1pm-2pm, Journey Room  > Creative Journey  3rd Tuesday of the month 1130am-1pm, Journey Room  > Look Good Feel Better  1st Wednesday of the month 10am-12 noon, Journey Room (Call American Cancer Society to register 1-800-395-5775)   

## 2017-06-01 NOTE — Progress Notes (Signed)
Hunter Jones tolerated Xgeva injection well without complaints or incident. Reviewed purpose and side effects with pt who verbalized understanding. Also instructed pt to take Calcium PO BID. Labs reviewed prior to administering this medication. VSS Pt discharged self ambulatory in satisfactory condition

## 2017-06-02 LAB — TESTOSTERONE: Testosterone: <3 ng/dL — ABNORMAL LOW (ref 264–916)

## 2017-06-09 ENCOUNTER — Encounter (HOSPITAL_COMMUNITY)
Admission: RE | Admit: 2017-06-09 | Discharge: 2017-06-09 | Disposition: A | Discharge status: Home / Self Care | Payer: BLUE CROSS/BLUE SHIELD | Source: Ambulatory Visit | Attending: Cardiology | Admitting: Cardiology

## 2017-06-09 ENCOUNTER — Encounter (HOSPITAL_BASED_OUTPATIENT_CLINIC_OR_DEPARTMENT_OTHER)
Admission: RE | Admit: 2017-06-09 | Discharge: 2017-06-09 | Disposition: A | Discharge status: Home / Self Care | Payer: BLUE CROSS/BLUE SHIELD | Source: Ambulatory Visit | Attending: Cardiology | Admitting: Cardiology

## 2017-06-09 ENCOUNTER — Encounter (HOSPITAL_COMMUNITY): Payer: Self-pay

## 2017-06-09 DIAGNOSIS — I25119 Atherosclerotic heart disease of native coronary artery with unspecified angina pectoris: Secondary | ICD-10-CM | POA: Diagnosis not present

## 2017-06-09 LAB — NM MYOCAR MULTI W/SPECT W/WALL MOTION / EF
CHL CUP NUCLEAR SDS: 0
CHL CUP NUCLEAR SSS: 2
LHR: 0.42
LVDIAVOL: 100 mL (ref 62–150)
LVSYSVOL: 48 mL
NUC STRESS TID: 1.09
Peak HR: 101 {beats}/min
Rest HR: 80 {beats}/min
SRS: 2

## 2017-06-09 MED ORDER — TECHNETIUM TC 99M TETROFOSMIN IV KIT
10.0000 | PACK | Freq: Once | INTRAVENOUS | Status: AC | PRN
  Administered 2017-06-09: 11 via INTRAVENOUS

## 2017-06-09 MED ORDER — REGADENOSON 0.4 MG/5ML IV SOLN
INTRAVENOUS | Status: AC
  Administered 2017-06-09: 0.4 mg via INTRAVENOUS
  Filled 2017-06-09: qty 5

## 2017-06-09 MED ORDER — SODIUM CHLORIDE 0.9% FLUSH
INTRAVENOUS | Status: AC
  Administered 2017-06-09: 10 mL via INTRAVENOUS
  Filled 2017-06-09: qty 10

## 2017-06-09 MED ORDER — TECHNETIUM TC 99M TETROFOSMIN IV KIT
30.0000 | PACK | Freq: Once | INTRAVENOUS | Status: AC | PRN
  Administered 2017-06-09: 30 via INTRAVENOUS

## 2017-06-10 ENCOUNTER — Telehealth: Payer: Self-pay

## 2017-06-10 NOTE — Telephone Encounter (Signed)
-----   Message from Satira Sark, MD sent at 06/10/2017  9:43 AM EST ----- Results reviewed. Please let him know that the stress test was overall low risk, does not suggest major progression in disease since his testing done through [facility] earlier in the year. Schedule a 6 month follow-up for now, sooner if symptoms worsen. A copy of this test should be forwarded to Monico Blitz, MD.

## 2017-06-10 NOTE — Telephone Encounter (Signed)
Wife notified and left msg on patients cell phone. Routed to PCP

## 2017-07-01 ENCOUNTER — Other Ambulatory Visit (HOSPITAL_COMMUNITY): Payer: BLUE CROSS/BLUE SHIELD

## 2017-07-01 ENCOUNTER — Inpatient Hospital Stay (HOSPITAL_COMMUNITY): Payer: BLUE CROSS/BLUE SHIELD | Attending: Internal Medicine

## 2017-07-01 ENCOUNTER — Ambulatory Visit (HOSPITAL_COMMUNITY): Payer: BLUE CROSS/BLUE SHIELD | Admitting: Internal Medicine

## 2017-07-01 ENCOUNTER — Other Ambulatory Visit: Payer: Self-pay

## 2017-07-01 ENCOUNTER — Inpatient Hospital Stay (HOSPITAL_COMMUNITY): Payer: BLUE CROSS/BLUE SHIELD | Admitting: Internal Medicine

## 2017-07-01 ENCOUNTER — Inpatient Hospital Stay (HOSPITAL_COMMUNITY): Payer: BLUE CROSS/BLUE SHIELD

## 2017-07-01 ENCOUNTER — Encounter (HOSPITAL_COMMUNITY): Payer: Self-pay | Admitting: Internal Medicine

## 2017-07-01 VITALS — BP 142/87 | HR 83 | Temp 97.7°F | Resp 20 | Wt 294.8 lb

## 2017-07-01 DIAGNOSIS — J449 Chronic obstructive pulmonary disease, unspecified: Secondary | ICD-10-CM | POA: Diagnosis not present

## 2017-07-01 DIAGNOSIS — Z5111 Encounter for antineoplastic chemotherapy: Secondary | ICD-10-CM | POA: Diagnosis not present

## 2017-07-01 DIAGNOSIS — C7951 Secondary malignant neoplasm of bone: Secondary | ICD-10-CM | POA: Insufficient documentation

## 2017-07-01 DIAGNOSIS — R600 Localized edema: Secondary | ICD-10-CM | POA: Diagnosis not present

## 2017-07-01 DIAGNOSIS — C61 Malignant neoplasm of prostate: Secondary | ICD-10-CM | POA: Insufficient documentation

## 2017-07-01 DIAGNOSIS — I10 Essential (primary) hypertension: Secondary | ICD-10-CM | POA: Insufficient documentation

## 2017-07-01 DIAGNOSIS — E0590 Thyrotoxicosis, unspecified without thyrotoxic crisis or storm: Secondary | ICD-10-CM | POA: Insufficient documentation

## 2017-07-01 LAB — COMPREHENSIVE METABOLIC PANEL
ALT: 26 U/L (ref 17–63)
ANION GAP: 8 (ref 5–15)
AST: 26 U/L (ref 15–41)
Albumin: 3.8 g/dL (ref 3.5–5.0)
Alkaline Phosphatase: 99 U/L (ref 38–126)
BUN: 24 mg/dL — ABNORMAL HIGH (ref 6–20)
CHLORIDE: 108 mmol/L (ref 101–111)
CO2: 21 mmol/L — AB (ref 22–32)
Calcium: 8.7 mg/dL — ABNORMAL LOW (ref 8.9–10.3)
Creatinine, Ser: 0.98 mg/dL (ref 0.61–1.24)
GFR calc non Af Amer: >60 mL/min (ref >60)
Glucose, Bld: 153 mg/dL — ABNORMAL HIGH (ref 65–99)
POTASSIUM: 4.6 mmol/L (ref 3.5–5.1)
SODIUM: 137 mmol/L (ref 135–145)
Total Bilirubin: 0.7 mg/dL (ref 0.3–1.2)
Total Protein: 6.4 g/dL — ABNORMAL LOW (ref 6.5–8.1)

## 2017-07-01 LAB — CBC WITH DIFFERENTIAL/PLATELET
Basophils Absolute: 0 10*3/uL (ref 0.0–0.1)
Basophils Relative: 0 %
EOS ABS: 0.2 10*3/uL (ref 0.0–0.7)
EOS PCT: 3 %
HCT: 41 % (ref 39.0–52.0)
Hemoglobin: 14.1 g/dL (ref 13.0–17.0)
LYMPHS PCT: 36 %
Lymphs Abs: 2.5 10*3/uL (ref 0.7–4.0)
MCH: 34.1 pg — AB (ref 26.0–34.0)
MCHC: 34.4 g/dL (ref 30.0–36.0)
MCV: 99 fL (ref 78.0–100.0)
MONO ABS: 0.5 10*3/uL (ref 0.1–1.0)
Monocytes Relative: 8 %
Neutro Abs: 3.6 10*3/uL (ref 1.7–7.7)
Neutrophils Relative %: 53 %
PLATELETS: 142 10*3/uL — AB (ref 150–400)
RBC: 4.14 MIL/uL — AB (ref 4.22–5.81)
RDW: 12.9 % (ref 11.5–15.5)
WBC: 6.8 10*3/uL (ref 4.0–10.5)

## 2017-07-01 LAB — PSA: PROSTATIC SPECIFIC ANTIGEN: 1.03 ng/mL (ref 0.00–4.00)

## 2017-07-01 MED ORDER — DENOSUMAB 120 MG/1.7ML ~~LOC~~ SOLN
120.0000 mg | Freq: Once | SUBCUTANEOUS | Status: AC
  Administered 2017-07-01: 120 mg via SUBCUTANEOUS
  Filled 2017-07-01: qty 1.7

## 2017-07-01 MED ORDER — LEUPROLIDE ACETATE (3 MONTH) 22.5 MG IM KIT
22.5000 mg | PACK | Freq: Once | INTRAMUSCULAR | Status: AC
  Administered 2017-07-01: 22.5 mg via INTRAMUSCULAR
  Filled 2017-07-01: qty 22.5

## 2017-07-01 NOTE — Patient Instructions (Signed)
[place] at [facility]  Discharge Instructions:  You received lupron and xgeva today. _______________________________________________________________  Thank you for choosing [place] at [facility] to provide your oncology and hematology care.  To afford each patient quality time with our providers, please arrive at least 15 minutes before your scheduled appointment.  You need to re-schedule your appointment if you arrive 10 or more minutes late.  We strive to give you quality time with our providers, and arriving late affects you and other patients whose appointments are after yours.  Also, if you no show three or more times for appointments you may be dismissed from the clinic.  Again, thank you for choosing [place] at [place] hope is that these requests will allow you access to exceptional care and in a timely manner. _______________________________________________________________  If you have questions after your visit, please contact our office at [phone] between the hours of 8:30 a.m. and 5:00 p.m. Voicemails left after 4:30 p.m. will not be returned until the following business day. _______________________________________________________________  For prescription refill requests, have your pharmacy contact our office. _______________________________________________________________  Recommendations made by the consultant and any test results will be sent to your referring physician. _______________________________________________________________

## 2017-07-01 NOTE — Progress Notes (Signed)
Patient denied tooth, jaw, or leg pain.  No recent or upcoming dental visits.  Taking calcium OTC.  Patient tolerated xgeva and lupron with no complaints voiced.  Injection sites clean and dry with no bruising or swelling noted at site.  Band aids applied.  VSS with discharge and left ambulatory.  No s/s of distress noted.

## 2017-07-01 NOTE — Progress Notes (Signed)
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  He denies any new problems no urinary hesitancy no hematuria denies any new bone pains.  He does have a history of chronic bilateral leg edema for 15 years lately he has noticed some increase in the edema he has been wearing elastic stockings and he keeps his legs up at night.  He is had a stress test back in December 2018 which was reported to them as negative he denies any chest pain shortness of breath difficulty breathing.  Otherwise all other systems review is negative  Physical exam: He appears well-nourished well-kempt no acute distress.   Oral cavity is normal no neck adenopathy was noted lungs are clear to auscultation and percussion  heart rhythm is regular. Abdomen is soft nontender without any hepatosplenomegaly.  Bilateral lower extremities show chronic brawny edema without any asymmetry cellulitis or tenderness. CNS he is alert awake oriented x3 no gross motor or sensory deficits.  Cranial nerves are intact.  Results review: His last bone density showed mild osteopenia   CT chest abdomen pelvis from June 2018 showed no evidence of metastatic type progression of disease there was a low volume bony metastatic disease with the areas of sclerosis involving the S1 vertebra right femoral head right ilium.    His last bone scan from June 2018 showed uptake in the same area at S1 and some in the posterior lateral aspect of left sixth rib.   CBC is stable.  CMP had been so far stable no hypocalcemia.  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  Patient is asymptomatic.  PSA today is pending but the last one from 3 months ago appeared to have declined to 1.5.   We will proceed with injection of Lupron  today.   We will also proceed with monthly Xgeva for bony metastatic disease today.   Patient was  reminded to take calcium and vitamin D supplements with [place] See.   He is been seen by dentist he currently has no active issues.  Bilateral chronic leg edema stable.  Recent stress test reportedly negative.   Hypertension well controlled. Hot flashes related to Lupron have subsided COPD stable. History of hyperthyroidism on methimazole managed by primary care.

## 2017-07-01 NOTE — Patient Instructions (Signed)
[facility] at [facility] Discharge Instructions  RECOMMENDATIONS MADE BY THE CONSULTANT AND ANY TEST RESULTS WILL BE SENT TO YOUR REFERRING PHYSICIAN.  You were seen today by Dr. Peru  Thank you for choosing [facility] at [facility] to provide your oncology and hematology care.  To afford each patient quality time with our provider, please arrive at least 15 minutes before your scheduled appointment time.    If you have a lab appointment with the Cancer Center please come in thru the  Main Entrance and check in at the main information desk  You need to re-schedule your appointment should you arrive 10 or more minutes late.  We strive to give you quality time with our providers, and arriving late affects you and other patients whose appointments are after yours.  Also, if you no show three or more times for appointments you may be dismissed from the clinic at the providers discretion.     Again, thank you for choosing [facility].  Our hope is that these requests will decrease the amount of time that you wait before being seen by our physicians.       _____________________________________________________________  Should you have questions after your visit to [facility], please contact our office at [phone] between the hours of 8:30 a.m. and 4:30 p.m.  Voicemails left after 4:30 p.m. will not be returned until the following business day.  For prescription refill requests, have your pharmacy contact our office.       Resources For Cancer Patients and their Caregivers ? American Cancer Society: Can assist with transportation, wigs, general needs, runs Look Good Feel Better.        1-888-227-6333 ? Cancer Care: Provides financial assistance, online support groups, medication/co-pay assistance.  1-800-813-HOPE (4673) ? [facility] Assists [place] cancer patients and their families  through emotional , educational and financial support.  336-427-4357 ? [place] DSS Where to apply for food stamps, Medicaid and utility assistance. 336-342-1394 ? RCATS: Transportation to medical appointments. 336-347-2287 ? Social Security Administration: May apply for disability if have a Stage IV cancer. 336-342-7796 1-800-772-1213 ? [place] Aging, Disability and Transit Services: Assists with nutrition, care and transit needs. 336-349-2343  Cancer Center Support Programs: @[email or web address]@ > Cancer Support Group  2nd Tuesday of the month 1pm-2pm, Journey Room  > Creative Journey  3rd Tuesday of the month 1130am-1pm, Journey Room  > Look Good Feel Better  1st Wednesday of the month 10am-12 noon, Journey Room (Call American Cancer Society to register 1-800-395-5775)     

## 2017-07-28 ENCOUNTER — Other Ambulatory Visit (HOSPITAL_COMMUNITY): Payer: Self-pay | Admitting: *Deleted

## 2017-07-28 DIAGNOSIS — C61 Malignant neoplasm of prostate: Secondary | ICD-10-CM

## 2017-07-29 ENCOUNTER — Other Ambulatory Visit: Payer: Self-pay

## 2017-07-29 ENCOUNTER — Inpatient Hospital Stay (HOSPITAL_COMMUNITY): Payer: BLUE CROSS/BLUE SHIELD

## 2017-07-29 ENCOUNTER — Inpatient Hospital Stay (HOSPITAL_COMMUNITY): Payer: BLUE CROSS/BLUE SHIELD | Attending: Internal Medicine

## 2017-07-29 ENCOUNTER — Encounter (HOSPITAL_COMMUNITY): Payer: Self-pay

## 2017-07-29 VITALS — BP 136/50 | HR 82 | Temp 97.5°F | Resp 20

## 2017-07-29 DIAGNOSIS — C61 Malignant neoplasm of prostate: Secondary | ICD-10-CM | POA: Diagnosis present

## 2017-07-29 DIAGNOSIS — C7951 Secondary malignant neoplasm of bone: Secondary | ICD-10-CM | POA: Insufficient documentation

## 2017-07-29 LAB — COMPREHENSIVE METABOLIC PANEL
ALT: 27 U/L (ref 17–63)
ANION GAP: 10 (ref 5–15)
AST: 27 U/L (ref 15–41)
Albumin: 3.9 g/dL (ref 3.5–5.0)
Alkaline Phosphatase: 94 U/L (ref 38–126)
BILIRUBIN TOTAL: 0.9 mg/dL (ref 0.3–1.2)
BUN: 26 mg/dL — ABNORMAL HIGH (ref 6–20)
CALCIUM: 9.3 mg/dL (ref 8.9–10.3)
CO2: 22 mmol/L (ref 22–32)
Chloride: 108 mmol/L (ref 101–111)
Creatinine, Ser: 1.27 mg/dL — ABNORMAL HIGH (ref 0.61–1.24)
GFR, EST NON AFRICAN AMERICAN: 58 mL/min — AB (ref >60)
GLUCOSE: 227 mg/dL — AB (ref 65–99)
POTASSIUM: 4.9 mmol/L (ref 3.5–5.1)
Sodium: 140 mmol/L (ref 135–145)
TOTAL PROTEIN: 6.8 g/dL (ref 6.5–8.1)

## 2017-07-29 LAB — CBC WITH DIFFERENTIAL/PLATELET
Basophils Absolute: 0 10*3/uL (ref 0.0–0.1)
Basophils Relative: 0 %
EOS ABS: 0.2 10*3/uL (ref 0.0–0.7)
EOS PCT: 2 %
HCT: 40.3 % (ref 39.0–52.0)
Hemoglobin: 13.8 g/dL (ref 13.0–17.0)
LYMPHS ABS: 2 10*3/uL (ref 0.7–4.0)
Lymphocytes Relative: 27 %
MCH: 34 pg (ref 26.0–34.0)
MCHC: 34.2 g/dL (ref 30.0–36.0)
MCV: 99.3 fL (ref 78.0–100.0)
Monocytes Absolute: 0.5 10*3/uL (ref 0.1–1.0)
Monocytes Relative: 7 %
Neutro Abs: 4.7 10*3/uL (ref 1.7–7.7)
Neutrophils Relative %: 64 %
PLATELETS: 154 10*3/uL (ref 150–400)
RBC: 4.06 MIL/uL — AB (ref 4.22–5.81)
RDW: 12.8 % (ref 11.5–15.5)
WBC: 7.4 10*3/uL (ref 4.0–10.5)

## 2017-07-29 MED ORDER — DENOSUMAB 120 MG/1.7ML ~~LOC~~ SOLN
120.0000 mg | Freq: Once | SUBCUTANEOUS | Status: AC
  Administered 2017-07-29: 120 mg via SUBCUTANEOUS
  Filled 2017-07-29: qty 1.7

## 2017-07-29 NOTE — Progress Notes (Signed)
Phillips Climes presents today for injection per MD orders. Xgeva 120 mcg administered SQ in right Abdomen. Administration without incident. Patient tolerated well.   Treatment given per orders. Patient tolerated it well without problems. Vitals stable and discharged home from clinic ambulatory. Follow up as scheduled.

## 2017-08-26 ENCOUNTER — Other Ambulatory Visit: Payer: Self-pay

## 2017-08-26 ENCOUNTER — Encounter (HOSPITAL_COMMUNITY): Payer: Self-pay

## 2017-08-26 ENCOUNTER — Inpatient Hospital Stay (HOSPITAL_COMMUNITY): Payer: BLUE CROSS/BLUE SHIELD

## 2017-08-26 ENCOUNTER — Inpatient Hospital Stay (HOSPITAL_COMMUNITY): Payer: BLUE CROSS/BLUE SHIELD | Attending: Internal Medicine

## 2017-08-26 VITALS — BP 130/61 | HR 81 | Temp 98.8°F | Resp 20

## 2017-08-26 DIAGNOSIS — C61 Malignant neoplasm of prostate: Secondary | ICD-10-CM | POA: Insufficient documentation

## 2017-08-26 DIAGNOSIS — C7951 Secondary malignant neoplasm of bone: Secondary | ICD-10-CM | POA: Diagnosis not present

## 2017-08-26 LAB — CBC WITH DIFFERENTIAL/PLATELET
Basophils Absolute: 0 10*3/uL (ref 0.0–0.1)
Basophils Relative: 0 %
EOS ABS: 0.3 10*3/uL (ref 0.0–0.7)
EOS PCT: 4 %
HCT: 40.5 % (ref 39.0–52.0)
Hemoglobin: 13.6 g/dL (ref 13.0–17.0)
LYMPHS ABS: 2.7 10*3/uL (ref 0.7–4.0)
Lymphocytes Relative: 37 %
MCH: 33.5 pg (ref 26.0–34.0)
MCHC: 33.6 g/dL (ref 30.0–36.0)
MCV: 99.8 fL (ref 78.0–100.0)
MONO ABS: 0.7 10*3/uL (ref 0.1–1.0)
Monocytes Relative: 9 %
Neutro Abs: 3.7 10*3/uL (ref 1.7–7.7)
Neutrophils Relative %: 50 %
PLATELETS: 146 10*3/uL — AB (ref 150–400)
RBC: 4.06 MIL/uL — AB (ref 4.22–5.81)
RDW: 12.7 % (ref 11.5–15.5)
WBC: 7.4 10*3/uL (ref 4.0–10.5)

## 2017-08-26 LAB — COMPREHENSIVE METABOLIC PANEL
ALT: 28 U/L (ref 17–63)
ANION GAP: 9 (ref 5–15)
AST: 25 U/L (ref 15–41)
Albumin: 3.8 g/dL (ref 3.5–5.0)
Alkaline Phosphatase: 78 U/L (ref 38–126)
BUN: 25 mg/dL — ABNORMAL HIGH (ref 6–20)
CHLORIDE: 107 mmol/L (ref 101–111)
CO2: 23 mmol/L (ref 22–32)
Calcium: 9 mg/dL (ref 8.9–10.3)
Creatinine, Ser: 1.22 mg/dL (ref 0.61–1.24)
Glucose, Bld: 128 mg/dL — ABNORMAL HIGH (ref 65–99)
POTASSIUM: 4.8 mmol/L (ref 3.5–5.1)
SODIUM: 139 mmol/L (ref 135–145)
Total Bilirubin: 0.8 mg/dL (ref 0.3–1.2)
Total Protein: 6.7 g/dL (ref 6.5–8.1)

## 2017-08-26 MED ORDER — DENOSUMAB 120 MG/1.7ML ~~LOC~~ SOLN
120.0000 mg | Freq: Once | SUBCUTANEOUS | Status: AC
  Administered 2017-08-26: 120 mg via SUBCUTANEOUS
  Filled 2017-08-26: qty 1.7

## 2017-08-26 NOTE — Progress Notes (Signed)
Elior L Sitts presents today for injection per the provider's orders.  Xgeva administration without incident; see MAR for injection details.  Patient tolerated procedure well and without incident.  No questions or complaints noted at this time.  Discharged ambulatory.  

## 2017-09-23 ENCOUNTER — Other Ambulatory Visit (HOSPITAL_COMMUNITY): Payer: Self-pay

## 2017-09-23 DIAGNOSIS — C61 Malignant neoplasm of prostate: Secondary | ICD-10-CM

## 2017-09-23 DIAGNOSIS — C7951 Secondary malignant neoplasm of bone: Secondary | ICD-10-CM

## 2017-09-26 ENCOUNTER — Inpatient Hospital Stay (HOSPITAL_BASED_OUTPATIENT_CLINIC_OR_DEPARTMENT_OTHER): Payer: BLUE CROSS/BLUE SHIELD | Admitting: Hematology

## 2017-09-26 ENCOUNTER — Other Ambulatory Visit: Payer: Self-pay

## 2017-09-26 ENCOUNTER — Inpatient Hospital Stay (HOSPITAL_COMMUNITY): Payer: BLUE CROSS/BLUE SHIELD | Attending: Internal Medicine

## 2017-09-26 ENCOUNTER — Encounter (HOSPITAL_COMMUNITY): Payer: Self-pay | Admitting: Hematology

## 2017-09-26 ENCOUNTER — Inpatient Hospital Stay (HOSPITAL_COMMUNITY): Payer: BLUE CROSS/BLUE SHIELD

## 2017-09-26 VITALS — BP 132/61 | HR 103 | Resp 18 | Wt 302.0 lb

## 2017-09-26 DIAGNOSIS — C61 Malignant neoplasm of prostate: Secondary | ICD-10-CM | POA: Insufficient documentation

## 2017-09-26 DIAGNOSIS — M899 Disorder of bone, unspecified: Secondary | ICD-10-CM | POA: Diagnosis not present

## 2017-09-26 DIAGNOSIS — C7951 Secondary malignant neoplasm of bone: Secondary | ICD-10-CM

## 2017-09-26 DIAGNOSIS — Z5111 Encounter for antineoplastic chemotherapy: Secondary | ICD-10-CM | POA: Diagnosis present

## 2017-09-26 DIAGNOSIS — R232 Flushing: Secondary | ICD-10-CM

## 2017-09-26 LAB — CBC WITH DIFFERENTIAL/PLATELET
BASOS PCT: 0 %
Basophils Absolute: 0 10*3/uL (ref 0.0–0.1)
EOS ABS: 0.3 10*3/uL (ref 0.0–0.7)
Eosinophils Relative: 3 %
HCT: 39.4 % (ref 39.0–52.0)
HEMOGLOBIN: 13.6 g/dL (ref 13.0–17.0)
Lymphocytes Relative: 29 %
Lymphs Abs: 2.4 10*3/uL (ref 0.7–4.0)
MCH: 34.1 pg — ABNORMAL HIGH (ref 26.0–34.0)
MCHC: 34.5 g/dL (ref 30.0–36.0)
MCV: 98.7 fL (ref 78.0–100.0)
MONOS PCT: 8 %
Monocytes Absolute: 0.7 10*3/uL (ref 0.1–1.0)
NEUTROS PCT: 60 %
Neutro Abs: 4.9 10*3/uL (ref 1.7–7.7)
PLATELETS: 148 10*3/uL — AB (ref 150–400)
RBC: 3.99 MIL/uL — ABNORMAL LOW (ref 4.22–5.81)
RDW: 12.7 % (ref 11.5–15.5)
WBC: 8.3 10*3/uL (ref 4.0–10.5)

## 2017-09-26 LAB — COMPREHENSIVE METABOLIC PANEL
ALBUMIN: 3.8 g/dL (ref 3.5–5.0)
ALK PHOS: 65 U/L (ref 38–126)
ALT: 24 U/L (ref 17–63)
ANION GAP: 9 (ref 5–15)
AST: 26 U/L (ref 15–41)
BUN: 27 mg/dL — ABNORMAL HIGH (ref 6–20)
CALCIUM: 8.8 mg/dL — AB (ref 8.9–10.3)
CHLORIDE: 110 mmol/L (ref 101–111)
CO2: 22 mmol/L (ref 22–32)
Creatinine, Ser: 1.2 mg/dL (ref 0.61–1.24)
GFR calc Af Amer: >60 mL/min (ref >60)
GFR calc non Af Amer: >60 mL/min (ref >60)
GLUCOSE: 160 mg/dL — AB (ref 65–99)
POTASSIUM: 4.5 mmol/L (ref 3.5–5.1)
SODIUM: 141 mmol/L (ref 135–145)
Total Bilirubin: 0.7 mg/dL (ref 0.3–1.2)
Total Protein: 6.5 g/dL (ref 6.5–8.1)

## 2017-09-26 LAB — PSA: Prostatic Specific Antigen: 1.09 ng/mL (ref 0.00–4.00)

## 2017-09-26 MED ORDER — LEUPROLIDE ACETATE (3 MONTH) 22.5 MG IM KIT
22.5000 mg | PACK | Freq: Once | INTRAMUSCULAR | Status: AC
  Administered 2017-09-26: 22.5 mg via INTRAMUSCULAR
  Filled 2017-09-26: qty 22.5

## 2017-09-26 MED ORDER — DENOSUMAB 120 MG/1.7ML ~~LOC~~ SOLN
120.0000 mg | Freq: Once | SUBCUTANEOUS | Status: AC
  Administered 2017-09-26: 120 mg via SUBCUTANEOUS
  Filled 2017-09-26: qty 1.7

## 2017-09-26 NOTE — Patient Instructions (Signed)
[place] at [facility]  Discharge Instructions:  You received an xgeva and lupron shot today.  _______________________________________________________________  Thank you for choosing [place] at [facility] to provide your oncology and hematology care.  To afford each patient quality time with our providers, please arrive at least 15 minutes before your scheduled appointment.  You need to re-schedule your appointment if you arrive 10 or more minutes late.  We strive to give you quality time with our providers, and arriving late affects you and other patients whose appointments are after yours.  Also, if you no show three or more times for appointments you may be dismissed from the clinic.  Again, thank you for choosing [place] at [place] hope is that these requests will allow you access to exceptional care and in a timely manner. _______________________________________________________________  If you have questions after your visit, please contact our office at [phone] between the hours of 8:30 a.m. and 5:00 p.m. Voicemails left after 4:30 p.m. will not be returned until the following business day. _______________________________________________________________  For prescription refill requests, have your pharmacy contact our office. _______________________________________________________________  Recommendations made by the consultant and any test results will be sent to your referring physician. _______________________________________________________________

## 2017-09-26 NOTE — Progress Notes (Signed)
Patient tolerated injections with no complaints voiced.  Sites clean and dry with no bruising or swelling noted.  Band aids applied.  VSS with discharge and left ambulatory with no s/s of distress noted.  Denied jaw and tooth pain.  Denied leg pain.  Taking calcium as directed.  No recent dental visits.

## 2017-09-26 NOTE — Assessment & Plan Note (Signed)
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 Loletha Grayer on 06/27/2014, stage IIIb (PT3PN0), Gleason 4+3 is equal to 7, underwent radiation.  He developed metastatic disease in June 2018.  He was started on Lupron and denosumab.  His last CT scan and bone scan were done in June 2018.  Bone scan was negative for metastatic disease.  However CT scan showed sclerotic lesions. - He is tolerating Lupron every 3 months reasonably well.  He has some emotional problems and hot flashes.  His last PSA in January 2019 has come down to 1.03.  PSA from today is pending.  We will continue to monitor once every 3 months.  I will also consider genetic testing for BRCA 1 and 2 and PALB2 down the line.  2.  Bone metastasis: He is receiving monthly denosumab injections.  He is tolerating that very well.  His calcium is within normal limits.  He drinks a lot of milk and eats a lot of ice cream.  He takes calcium 500 mg once or twice per week.

## 2017-09-26 NOTE — Progress Notes (Signed)
[place], [place] [address]   CLINIC:  Medical Oncology/Hematology  PCP:  Monico Blitz, [place] [address] [phone]   REASON FOR VISIT:  Follow-up for metastatic prostate cancer.  CURRENT THERAPY:  Lupron and denosumab.  BRIEF ONCOLOGIC HISTORY:    Prostate cancer ([place])   02/06/2014 Procedure    Prostate biopsy by Dr. Clyde Lundborg      02/11/2014 Pathology Results    Prostatic adenocarcinoma identified in 5 of 6 prostate specimens with Gleason pattern showing primary pattern-grade 4, secondary pattern grade 3.  Total Gleason score equals 7.  Proportion of prostatic tissue involved by tumor: 70%.      05/03/2014 Imaging    Bone scan- Negative       06/27/2014 Procedure    Radical resection of prostate by Dr. Raynelle Bring      07/29/2014 Pathology Results    1. Prostate, radical resection - PROSTATIC ADENOCARCINOMA, GLEASON SCORE 4 + 3 = 7. - RIGHT AND LEFT PROSTATE INVOLVED. - RIGHT AND LEFT SEMINAL VESICLES INVOLVED BY TUMOR. - EXTRACAPSULAR EXTENSION BY TUMOR. - TUMOR EXTENDS INTO BLADDER NECK TISSUE. - MARGINS NOT INVOLVED. 2. Lymph nodes, regional resection, left pelvic - THREE BENIGN LYMPH NODES (0/3). 3. Lymph nodes, regional resection, right pelvic - FOUR BENIGN LYMPH NODES (0/4).      11/16/2016 Imaging    Bone scan- New areas of increased uptake superolateral to the left orbit (faint), at S1, and in the posterolateral aspect of the left sixth rib.       11/25/2016 Imaging    CT CAP- 1. Status post radical prostatectomy. No findings to suggest local recurrence of disease or definite extraskeletal metastatic disease in the chest, abdomen or pelvis. However, there are several osseous lesions, as above, concerning for metastatic disease to the bones. 2. Hepatic steatosis. 3. Aortic atherosclerosis, in addition to 2 vessel coronary artery disease. Please note that although the presence of coronary  artery calcium documents the presence of coronary artery disease, the severity of this disease and any potential stenosis cannot be assessed on this non-gated CT examination. Assessment for potential risk factor modification, dietary therapy or pharmacologic therapy may be warranted, if clinically indicated. 4. Additional incidental findings, as above.        CANCER STAGING: Cancer Staging Prostate cancer [facility]) Staging form: Prostate, AJCC 7th Edition - Pathologic stage from 07/29/2014: Stage III (T3b, N0, cM0, Gleason 7) - Signed by Baird Cancer, PA-C on 11/09/2016 - Clinical: Stage IV (T3, N0, M1, PSA: Less than 10, Gleason 7) - Signed by [name], MD on 12/21/2016 - Pathologic: No stage assigned - Unsigned    INTERVAL HISTORY:  Hunter Jones 62 y.o. male returns for follow-up of metastatic castration sensitive prostate cancer.  He is tolerating Lupron very well.  He does have emotional problems.  He also reports having hot flashes.  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  He drinks a lot of milk and eats a lot of milk products.  He does not report any specific side effects from denosumab.  He is continuing to be active.  He denies any fevers or infections.  No recent hospitalizations.  Denies night sweats or weight loss.  REVIEW OF SYSTEMS:  Review of Systems  Constitutional: Negative.   HENT:  Negative.   Respiratory: Negative.   Cardiovascular: Positive for leg swelling.  Gastrointestinal: Negative.   Genitourinary: Negative.    Musculoskeletal: Negative.  Skin: Negative.   Neurological: Positive for dizziness.  Hematological: Negative.   Psychiatric/Behavioral: Negative.      PAST MEDICAL/SURGICAL HISTORY:  Past Medical History:  Diagnosis Date  . COPD (chronic obstructive pulmonary disease) ([place])   . Coronary artery disease    DES to mid LAD August 2014 ([facility])  . Essential hypertension   . Hyperthyroidism   . NSTEMI (non-ST elevated myocardial  infarction) ([place]) 2014  . Prostate cancer [facility])    Past Surgical History:  Procedure Laterality Date  . APPENDECTOMY    . LYMPHADENECTOMY Bilateral 06/27/2014   Procedure: LYMPHADENECTOMY;  Surgeon: Raynelle Bring, MD;  Location: [facility] ORS;  Service: Urology;  Laterality: Bilateral;  . ROBOT ASSISTED LAPAROSCOPIC RADICAL PROSTATECTOMY N/A 06/27/2014   Procedure: ROBOTIC ASSISTED LAPAROSCOPIC RADICAL PROSTATECTOMY LEVEL 3;  Surgeon: Raynelle Bring, MD;  Location: [facility]S;  Service: Urology;  Laterality: N/A;  . TONSILLECTOMY       SOCIAL HISTORY:  Social History   Socioeconomic History  . Marital status: Married    Spouse name: Not on file  . Number of children: Not on file  . Years of education: Not on file  . Highest education level: Not on file  Occupational History  . Not on file  Social Needs  . Financial resource strain: Not on file  . Food insecurity:    Worry: Not on file    Inability: Not on file  . Transportation needs:    Medical: Not on file    Non-medical: Not on file  Tobacco Use  . Smoking status: Former Smoker    Years: 40.00    Types: Cigarettes    Last attempt to quit: 06/21/2012    Years since quitting: 5.2  . Smokeless tobacco: Never Used  Substance and Sexual Activity  . Alcohol use: Yes    Comment: occasional  . Drug use: No  . Sexual activity: Not on file  Lifestyle  . Physical activity:    Days per week: Not on file    Minutes per session: Not on file  . Stress: Not on file  Relationships  . Social connections:    Talks on phone: Not on file    Gets together: Not on file    Attends religious service: Not on file    Active member of club or organization: Not on file    Attends meetings of clubs or organizations: Not on file    Relationship status: Not on file  . Intimate partner violence:    Fear of current or ex partner: Not on file    Emotionally abused: Not on file    Physically abused: Not on file    Forced sexual activity: Not on file    Other Topics Concern  . Not on file  Social History Narrative  . Not on file    FAMILY HISTORY:  Family History  Problem Relation Age of Onset  . Dementia Mother   . Thyroid disease Mother   . Kidney Stones Father   . Stroke Brother     CURRENT MEDICATIONS:  Outpatient Encounter Medications as of 09/26/2017  Medication Sig  . albuterol (PROVENTIL HFA;VENTOLIN HFA) 108 (90 BASE) MCG/ACT inhaler Inhale 1-2 puffs into the lungs every 6 (six) hours as needed for wheezing or shortness of breath.  [name] Kitchen atorvastatin (LIPITOR) 40 MG tablet Take 40 mg by mouth at bedtime.  [name] Kitchen Co-Enzyme Q-10 100 MG CAPS Take 1 capsule by mouth daily.  . fluticasone-salmeterol (ADVAIR HFA) 115-21 MCG/ACT inhaler Inhale  2 puffs into the lungs 2 (two) times daily.   [name] Kitchen KRILL OIL PO Take 1 capsule by mouth daily.   [name] Kitchen Leuprolide Acetate, 3 Month, (LUPRON DEPOT, 86-MONTH, IM) Inject into the muscle every 3 (three) months.  [name] Kitchen losartan (COZAAR) 25 MG tablet Take 25 mg by mouth daily.  . MEGESTROL ACETATE PO Take 20 mg by mouth daily.   . methimazole (TAPAZOLE) 10 MG tablet Take 10 mg by mouth every other day.   . metoprolol succinate (TOPROL-XL) 50 MG 24 hr tablet Take 50 mg by mouth every morning. Take with or immediately following a meal.  . Multiple Vitamin (MULTIVITAMIN) tablet Take 1 tablet by mouth daily.  . [EXPIRED] denosumab (XGEVA) injection 120 mg   . [EXPIRED] leuprolide (LUPRON) injection 22.5 mg    No facility-administered encounter medications on file as of 09/26/2017.     ALLERGIES:  Allergies  Allergen Reactions  . Penicillins Hives     PHYSICAL EXAM:  ECOG Performance status: 1  Vitals:   09/26/17 1542  BP: 132/61  Pulse: (!) 103  Resp: 18  SpO2: 97%   Filed Weights   09/26/17 1542  Weight: (!) 302 lb (137 kg)    Physical Exam  Musculoskeletal: He exhibits edema.     LABORATORY DATA:  I have reviewed the labs as listed.  CBC    Component Value Date/Time   WBC 8.3  09/26/2017 1428   RBC 3.99 (L) 09/26/2017 1428   HGB 13.6 09/26/2017 1428   HCT 39.4 09/26/2017 1428   PLT 148 (L) 09/26/2017 1428   MCV 98.7 09/26/2017 1428   MCH 34.1 (H) 09/26/2017 1428   MCHC 34.5 09/26/2017 1428   RDW 12.7 09/26/2017 1428   LYMPHSABS 2.4 09/26/2017 1428   MONOABS 0.7 09/26/2017 1428   EOSABS 0.3 09/26/2017 1428   BASOSABS 0.0 09/26/2017 1428   CMP Latest Ref Rng & Units 09/26/2017 08/26/2017 07/29/2017  Glucose 65 - 99 mg/dL 160(H) 128(H) 227(H)  BUN 6 - 20 mg/dL 27(H) 25(H) 26(H)  Creatinine 0.61 - 1.24 mg/dL 1.20 1.22 1.27(H)  Sodium 135 - 145 mmol/L 141 139 140  Potassium 3.5 - 5.1 mmol/L 4.5 4.8 4.9  Chloride 101 - 111 mmol/L 110 107 108  CO2 22 - 32 mmol/L '22 23 22  '$ Calcium 8.9 - 10.3 mg/dL 8.8(L) 9.0 9.3  Total Protein 6.5 - 8.1 g/dL 6.5 6.7 6.8  Total Bilirubin 0.3 - 1.2 mg/dL 0.7 0.8 0.9  Alkaline Phos 38 - 126 U/L 65 78 94  AST 15 - 41 U/L '26 25 27  '$ ALT 17 - 63 U/L '24 28 27       '$ DIAGNOSTIC IMAGING:  I have reviewed his CT scan and bone scan independently from June 2018 and agree with the findings.     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 Loletha Grayer on 06/27/2014, stage IIIb (PT3PN0), Gleason 4+3 is equal to 7, underwent radiation.  He developed metastatic disease in June 2018.  He was started on Lupron and denosumab.  His last CT scan and bone scan were done in June 2018.  Bone scan was negative for metastatic disease.  However CT scan showed sclerotic lesions. - He is tolerating Lupron every 3 months reasonably well.  He has some emotional problems and hot flashes.  His last PSA in  January 2019 has come down to 1.03.  PSA from today is pending.  We will continue to monitor once every 3 months.  I will also consider genetic testing for BRCA 1 and 2 and  PALB2 down the line.  2.  Bone metastasis: He is receiving monthly denosumab injections.  He is tolerating that very well.  His calcium is within normal limits.  He drinks a lot of milk and eats a lot of ice cream.  He takes calcium 500 mg once or twice per week.      Orders placed this encounter:  No orders of the defined types were placed in this encounter.     Derek Jack, MD [place] [phone]

## 2017-10-21 ENCOUNTER — Other Ambulatory Visit (HOSPITAL_COMMUNITY): Payer: Self-pay

## 2017-10-21 DIAGNOSIS — C7951 Secondary malignant neoplasm of bone: Secondary | ICD-10-CM

## 2017-10-21 DIAGNOSIS — C61 Malignant neoplasm of prostate: Secondary | ICD-10-CM

## 2017-10-24 ENCOUNTER — Other Ambulatory Visit: Payer: Self-pay

## 2017-10-24 ENCOUNTER — Inpatient Hospital Stay (HOSPITAL_COMMUNITY): Payer: BLUE CROSS/BLUE SHIELD

## 2017-10-24 ENCOUNTER — Encounter (HOSPITAL_COMMUNITY): Payer: Self-pay

## 2017-10-24 ENCOUNTER — Inpatient Hospital Stay (HOSPITAL_COMMUNITY): Payer: BLUE CROSS/BLUE SHIELD | Attending: Internal Medicine

## 2017-10-24 VITALS — BP 124/74 | HR 81 | Temp 98.0°F | Resp 20

## 2017-10-24 DIAGNOSIS — C61 Malignant neoplasm of prostate: Secondary | ICD-10-CM | POA: Insufficient documentation

## 2017-10-24 DIAGNOSIS — C7951 Secondary malignant neoplasm of bone: Secondary | ICD-10-CM | POA: Diagnosis present

## 2017-10-24 LAB — COMPREHENSIVE METABOLIC PANEL
ALK PHOS: 71 U/L (ref 38–126)
ALT: 32 U/L (ref 17–63)
AST: 24 U/L (ref 15–41)
Albumin: 3.8 g/dL (ref 3.5–5.0)
Anion gap: 4 — ABNORMAL LOW (ref 5–15)
BUN: 21 mg/dL — AB (ref 6–20)
CALCIUM: 9 mg/dL (ref 8.9–10.3)
CO2: 26 mmol/L (ref 22–32)
Chloride: 108 mmol/L (ref 101–111)
Creatinine, Ser: 1.13 mg/dL (ref 0.61–1.24)
GFR calc Af Amer: >60 mL/min (ref >60)
Glucose, Bld: 207 mg/dL — ABNORMAL HIGH (ref 65–99)
Potassium: 4.6 mmol/L (ref 3.5–5.1)
Sodium: 138 mmol/L (ref 135–145)
TOTAL PROTEIN: 6.6 g/dL (ref 6.5–8.1)
Total Bilirubin: 0.9 mg/dL (ref 0.3–1.2)

## 2017-10-24 MED ORDER — DENOSUMAB 120 MG/1.7ML ~~LOC~~ SOLN
120.0000 mg | Freq: Once | SUBCUTANEOUS | Status: AC
  Administered 2017-10-24: 120 mg via SUBCUTANEOUS
  Filled 2017-10-24: qty 1.7

## 2017-10-24 NOTE — Progress Notes (Signed)
Phillips Climes presents today for injection per MD orders. xgeva 120 mg administered SQ in right Abdomen. Administration without incident. Patient tolerated well.    Vitals stable and discharged home from clinic ambulatory. Follow up as scheduled.

## 2017-10-24 NOTE — Patient Instructions (Signed)
[place] at [facility] Discharge Instructions  xgeva injection given today Follow up as scheduled.   Thank you for choosing [place] at [facility] to provide your oncology and hematology care.  To afford each patient quality time with our provider, please arrive at least 15 minutes before your scheduled appointment time.   If you have a lab appointment with the [place] please come in thru the  Main Entrance and check in at the main information desk  You need to re-schedule your appointment should you arrive 10 or more minutes late.  We strive to give you quality time with our providers, and arriving late affects you and other patients whose appointments are after yours.  Also, if you no show three or more times for appointments you may be dismissed from the clinic at the providers discretion.     Again, thank you for choosing [facility].  Our hope is that these requests will decrease the amount of time that you wait before being seen by our physicians.       _____________________________________________________________  Should you have questions after your visit to [facility], please contact our office at [phone] between the hours of 8:30 a.m. and 4:30 p.m.  Voicemails left after 4:30 p.m. will not be returned until the following business day.  For prescription refill requests, have your pharmacy contact our office.       Resources For Cancer Patients and their Caregivers ? American Cancer Society: Can assist with transportation, wigs, general needs, runs Look Good Feel Better.        601-659-5735 ? Cancer Care: Provides financial assistance, online support groups, medication/co-pay assistance.  1-800-813-HOPE 249-817-2751) ? [place] Assists [place] cancer patients and their families through emotional , educational and financial support.  403 104 0652 ? [place]  DSS Where to apply for food stamps, Medicaid and utility assistance. 431-309-7998 ? RCATS: Transportation to medical appointments. 308-241-4026 ? Social Security Administration: May apply for disability if have a Stage IV cancer. (540) 680-9811 873-686-4107 ? [employer], Disability and Transit Services: Assists with nutrition, care and transit needs. [place] Support Programs:   > Cancer Support Group  2nd Tuesday of the month 1pm-2pm, Journey Room   > Creative Journey  3rd Tuesday of the month 1130am-1pm, Journey Room

## 2017-11-21 ENCOUNTER — Encounter (HOSPITAL_COMMUNITY): Payer: Self-pay

## 2017-11-21 ENCOUNTER — Inpatient Hospital Stay (HOSPITAL_COMMUNITY): Payer: BLUE CROSS/BLUE SHIELD

## 2017-11-21 ENCOUNTER — Inpatient Hospital Stay (HOSPITAL_COMMUNITY): Payer: BLUE CROSS/BLUE SHIELD | Attending: Hematology

## 2017-11-21 VITALS — BP 134/60 | HR 85 | Temp 98.0°F | Resp 18

## 2017-11-21 DIAGNOSIS — C7951 Secondary malignant neoplasm of bone: Secondary | ICD-10-CM | POA: Diagnosis not present

## 2017-11-21 DIAGNOSIS — C61 Malignant neoplasm of prostate: Secondary | ICD-10-CM | POA: Insufficient documentation

## 2017-11-21 LAB — COMPREHENSIVE METABOLIC PANEL
ALBUMIN: 3.6 g/dL (ref 3.5–5.0)
ALK PHOS: 74 U/L (ref 38–126)
ALT: 25 U/L (ref 17–63)
ANION GAP: 7 (ref 5–15)
AST: 20 U/L (ref 15–41)
BILIRUBIN TOTAL: 0.9 mg/dL (ref 0.3–1.2)
BUN: 22 mg/dL — AB (ref 6–20)
CALCIUM: 8.9 mg/dL (ref 8.9–10.3)
CO2: 23 mmol/L (ref 22–32)
CREATININE: 1.07 mg/dL (ref 0.61–1.24)
Chloride: 109 mmol/L (ref 101–111)
GFR calc Af Amer: >60 mL/min (ref >60)
GFR calc non Af Amer: >60 mL/min (ref >60)
GLUCOSE: 271 mg/dL — AB (ref 65–99)
Potassium: 4.5 mmol/L (ref 3.5–5.1)
Sodium: 139 mmol/L (ref 135–145)
TOTAL PROTEIN: 6.3 g/dL — AB (ref 6.5–8.1)

## 2017-11-21 MED ORDER — DENOSUMAB 120 MG/1.7ML ~~LOC~~ SOLN
120.0000 mg | Freq: Once | SUBCUTANEOUS | Status: AC
  Administered 2017-11-21: 120 mg via SUBCUTANEOUS
  Filled 2017-11-21: qty 1.7

## 2017-11-21 NOTE — Patient Instructions (Signed)
[facility] at [facility]  Discharge Instructions: You received an xgeva shot today.  _______________________________________________________________  Thank you for choosing [facility] at [facility] to provide your oncology and hematology care.  To afford each patient quality time with our providers, please arrive at least 15 minutes before your scheduled appointment.  You need to re-schedule your appointment if you arrive 10 or more minutes late.  We strive to give you quality time with our providers, and arriving late affects you and other patients whose appointments are after yours.  Also, if you no show three or more times for appointments you may be dismissed from the clinic.  Again, thank you for choosing [facility] at [facility]. Our hope is that these requests will allow you access to exceptional care and in a timely manner. _______________________________________________________________  If you have questions after your visit, please contact our office at [phone] between the hours of 8:30 a.m. and 5:00 p.m. Voicemails left after 4:30 p.m. will not be returned until the following business day. _______________________________________________________________  For prescription refill requests, have your pharmacy contact our office. _______________________________________________________________  Recommendations made by the consultant and any test results will be sent to your referring physician. _______________________________________________________________ 

## 2017-11-21 NOTE — Progress Notes (Signed)
Patient for xgeva shot today.  Patient stated he does not take a calcium tab daily but maybe a couple of times a week.  Denied tooth, jaw, or leg pain.  No recent or upcoming dental visits.    Reviewed labs with pharmacy and ok to give xgeva today.   Patient tolerated xgeva shot with no complaints voiced.  Injection site clean and dry with no bruising or swelling noted at site.  Band aid applied.  VSS with discharge and left ambulatory with no s/s of distress noted.

## 2017-12-08 NOTE — Progress Notes (Signed)
Cardiology Office Note  Date: 12/09/2017   ID: DENG KEMLER, DOB 01/28/1956, MRN [id number]  PCP: Monico Blitz, MD  Primary Cardiologist: Rozann Lesches, MD   Chief Complaint  Patient presents with  . Coronary Artery Disease    History of Present Illness: Hunter Jones is a 62 y.o. male that I met in December 2018.  He presents for a routine visit.  Reports no angina symptoms at this time.  He describes NYHA class II dyspnea, admits that he is not exercising regularly.  His weight has increased since last visit.  Today we talked about diet and a walking plan.  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  I reviewed his medications.  Current cardiac regimen includes aspirin, Lipitor, Cozaar, and Toprol-XL.  Lexiscan Myoview in December 2018 was low risk without significant ischemia.  We discussed these results today.  He continues to follow with Dr. Manuella Ghazi.  Past Medical History:  Diagnosis Date  . COPD (chronic obstructive pulmonary disease) ([place])   . Coronary artery disease    DES to mid LAD August 2014 ([facility])  . Essential hypertension   . Hyperthyroidism   . NSTEMI (non-ST elevated myocardial infarction) ([place]) 2014  . Prostate cancer [facility])     Past Surgical History:  Procedure Laterality Date  . APPENDECTOMY    . LYMPHADENECTOMY Bilateral 06/27/2014   Procedure: LYMPHADENECTOMY;  Surgeon: Raynelle Bring, MD;  Location: [facility];  Service: Urology;  Laterality: Bilateral;  . ROBOT ASSISTED LAPAROSCOPIC RADICAL PROSTATECTOMY N/A 06/27/2014   Procedure: ROBOTIC ASSISTED LAPAROSCOPIC RADICAL PROSTATECTOMY LEVEL 3;  Surgeon: Raynelle Bring, MD;  Location: [facility];  Service: Urology;  Laterality: N/A;  . TONSILLECTOMY      Current Outpatient Medications  Medication Sig Dispense Refill  . albuterol (PROVENTIL HFA;VENTOLIN HFA) 108 (90 BASE) MCG/ACT inhaler Inhale 1-2 puffs into the lungs every 6 (six) hours as needed for wheezing or shortness of breath.    [name] Kitchen aspirin EC 81  MG tablet Take 81 mg by mouth daily.    [name] Kitchen atorvastatin (LIPITOR) 40 MG tablet Take 40 mg by mouth at bedtime.    [name] Kitchen Co-Enzyme Q-10 100 MG CAPS Take 1 capsule by mouth daily.    . fluticasone-salmeterol (ADVAIR HFA) 115-21 MCG/ACT inhaler Inhale 2 puffs into the lungs 2 (two) times daily.     [name] Kitchen KRILL OIL PO Take 1 capsule by mouth daily.     [name] Kitchen Leuprolide Acetate, 3 Month, (LUPRON DEPOT, 58-MONTH, IM) Inject into the muscle every 3 (three) months.    [name] Kitchen losartan (COZAAR) 25 MG tablet Take 25 mg by mouth daily.    . MEGESTROL ACETATE PO Take 20 mg by mouth daily.     . methimazole (TAPAZOLE) 10 MG tablet Take 10 mg by mouth every other day.     . metoprolol succinate (TOPROL-XL) 50 MG 24 hr tablet Take 50 mg by mouth every morning. Take with or immediately following a meal.    . Multiple Vitamin (MULTIVITAMIN) tablet Take 1 tablet by mouth daily.     No current facility-administered medications for this visit.    Allergies:  Penicillins   Social History: The patient  reports that he quit smoking about 5 years ago. His smoking use included cigarettes. He quit after 40.00 years of use. He has never used smokeless tobacco. He reports that he drinks alcohol. He reports that he does not use drugs.   ROS:  Please see the history of present illness. Otherwise, complete  review of systems is positive for lower leg swelling, he uses compression stockings intermittently.  All other systems are reviewed and negative.   Physical Exam: VS:  BP 122/75   Pulse 85   Ht '5\' 11"'$  (1.803 m)   Wt (!) 309 lb (140.2 kg)   SpO2 95%   BMI 43.10 kg/m , BMI Body mass index is 43.1 kg/m.  Wt Readings from Last 3 Encounters:  12/09/17 (!) 309 lb (140.2 kg)  09/26/17 (!) 302 lb (137 kg)  07/01/17 294 lb 12.8 oz (133.7 kg)    General: Morbidly obese male, appears comfortable at rest. HEENT: Conjunctiva and lids normal, oropharynx clear. Neck: Supple, no elevated JVP or carotid bruits, no thyromegaly. Lungs: Clear to  auscultation, nonlabored breathing at rest. Cardiac: Regular rate and rhythm, no S3 or significant systolic murmur, no pericardial rub. Abdomen: Obese, nontender, bowel sounds present. Extremities: 1-2+ lower leg edema with venous stasis, distal pulses 2+. Skin: Warm and dry. Musculoskeletal: No kyphosis. Neuropsychiatric: Alert and oriented x3, affect grossly appropriate.  ECG: I personally reviewed the tracing from 05/27/2017 which showed sinus rhythm with borderline low voltage.  Recent Labwork: 09/26/2017: Hemoglobin 13.6; Platelets 148 11/21/2017: ALT 25; AST 20; BUN 22; Creatinine, Ser 1.07; Potassium 4.5; Sodium 139   Other Studies Reviewed Today:  Lexiscan Myoview 06/09/2017:  Normal perfusion. LVEF 52% with apical hypokinesis. Consider echo to confirm LVEF and wall motion.  This is a low risk study.  Nuclear stress EF: 52%.  Assessment and Plan:  1.  CAD with history of DES to the mid LAD in 2014.  Follow-up Myoview from December 2018 was low risk without active ischemia, LVEF 62%.  Plan to continue medical therapy and observation in the absence of progressive angina or worsening shortness of breath.  2.  Morbid obesity.  Today we discussed diet and walking plan for weight loss.  3.  Mixed hyperlipidemia, on Lipitor and omega-3 supplements.  He follows with Dr. Manuella Ghazi.  4.  Lower leg edema and venous stasis.  Weight loss would be beneficial, also increased activity.  He does use compression stockings intermittently.  Holding off diuretic at this point.  5.  Essential hypertension, blood pressure is adequately controlled today.  Current medicines were reviewed with the patient today.  Disposition: Follow-up in 6 months.  Signed, Satira Sark, MD, [facility] 12/09/2017 8:50 AM    [facility] [place], [place], [place] [address] Phone: [phone]; Fax: [phone]

## 2017-12-09 ENCOUNTER — Other Ambulatory Visit: Payer: Self-pay

## 2017-12-09 ENCOUNTER — Encounter: Payer: Self-pay | Admitting: Cardiology

## 2017-12-09 ENCOUNTER — Ambulatory Visit (INDEPENDENT_AMBULATORY_CARE_PROVIDER_SITE_OTHER): Payer: BLUE CROSS/BLUE SHIELD | Admitting: Cardiology

## 2017-12-09 VITALS — BP 122/75 | HR 85 | Ht 71.0 in | Wt 309.0 lb

## 2017-12-09 DIAGNOSIS — I10 Essential (primary) hypertension: Secondary | ICD-10-CM | POA: Diagnosis not present

## 2017-12-09 DIAGNOSIS — I25119 Atherosclerotic heart disease of native coronary artery with unspecified angina pectoris: Secondary | ICD-10-CM

## 2017-12-09 DIAGNOSIS — E782 Mixed hyperlipidemia: Secondary | ICD-10-CM | POA: Diagnosis not present

## 2017-12-09 NOTE — Patient Instructions (Addendum)

## 2017-12-13 ENCOUNTER — Other Ambulatory Visit (HOSPITAL_COMMUNITY): Payer: Self-pay | Admitting: *Deleted

## 2017-12-13 DIAGNOSIS — C61 Malignant neoplasm of prostate: Secondary | ICD-10-CM

## 2017-12-19 ENCOUNTER — Inpatient Hospital Stay (HOSPITAL_COMMUNITY): Payer: BLUE CROSS/BLUE SHIELD

## 2017-12-19 ENCOUNTER — Inpatient Hospital Stay (HOSPITAL_BASED_OUTPATIENT_CLINIC_OR_DEPARTMENT_OTHER): Payer: BLUE CROSS/BLUE SHIELD | Admitting: Internal Medicine

## 2017-12-19 ENCOUNTER — Inpatient Hospital Stay (HOSPITAL_COMMUNITY): Payer: BLUE CROSS/BLUE SHIELD | Attending: Hematology

## 2017-12-19 ENCOUNTER — Other Ambulatory Visit: Payer: Self-pay

## 2017-12-19 ENCOUNTER — Encounter (HOSPITAL_COMMUNITY): Payer: Self-pay | Admitting: Internal Medicine

## 2017-12-19 VITALS — BP 129/67 | HR 87 | Temp 97.9°F | Resp 16 | Wt 307.0 lb

## 2017-12-19 DIAGNOSIS — I10 Essential (primary) hypertension: Secondary | ICD-10-CM

## 2017-12-19 DIAGNOSIS — Z5111 Encounter for antineoplastic chemotherapy: Secondary | ICD-10-CM | POA: Diagnosis present

## 2017-12-19 DIAGNOSIS — C61 Malignant neoplasm of prostate: Secondary | ICD-10-CM

## 2017-12-19 DIAGNOSIS — C7951 Secondary malignant neoplasm of bone: Secondary | ICD-10-CM

## 2017-12-19 DIAGNOSIS — E039 Hypothyroidism, unspecified: Secondary | ICD-10-CM

## 2017-12-19 DIAGNOSIS — Z9079 Acquired absence of other genital organ(s): Secondary | ICD-10-CM

## 2017-12-19 DIAGNOSIS — Z87891 Personal history of nicotine dependence: Secondary | ICD-10-CM

## 2017-12-19 LAB — COMPREHENSIVE METABOLIC PANEL
ALT: 24 U/L (ref 0–44)
ANION GAP: 9 (ref 5–15)
AST: 20 U/L (ref 15–41)
Albumin: 3.9 g/dL (ref 3.5–5.0)
Alkaline Phosphatase: 80 U/L (ref 38–126)
BUN: 29 mg/dL — AB (ref 8–23)
CO2: 23 mmol/L (ref 22–32)
Calcium: 9 mg/dL (ref 8.9–10.3)
Chloride: 108 mmol/L (ref 98–111)
Creatinine, Ser: 1.24 mg/dL (ref 0.61–1.24)
Glucose, Bld: 165 mg/dL — ABNORMAL HIGH (ref 70–99)
POTASSIUM: 4.7 mmol/L (ref 3.5–5.1)
Sodium: 140 mmol/L (ref 135–145)
Total Bilirubin: 0.7 mg/dL (ref 0.3–1.2)
Total Protein: 6.8 g/dL (ref 6.5–8.1)

## 2017-12-19 LAB — PSA: PROSTATIC SPECIFIC ANTIGEN: 1.38 ng/mL (ref 0.00–4.00)

## 2017-12-19 MED ORDER — DENOSUMAB 120 MG/1.7ML ~~LOC~~ SOLN
120.0000 mg | Freq: Once | SUBCUTANEOUS | Status: AC
  Administered 2017-12-19: 120 mg via SUBCUTANEOUS
  Filled 2017-12-19: qty 1.7

## 2017-12-19 MED ORDER — LEUPROLIDE ACETATE (3 MONTH) 22.5 MG IM KIT
22.5000 mg | PACK | Freq: Once | INTRAMUSCULAR | Status: AC
  Administered 2017-12-19: 22.5 mg via INTRAMUSCULAR
  Filled 2017-12-19: qty 22.5

## 2017-12-19 NOTE — Patient Instructions (Signed)
[facility] Discharge Instructions  Seen by Dr. Walden Field today.   Thank you for choosing [facility] to provide your oncology and hematology care.  To afford each patient quality time with our provider, please arrive at least 15 minutes before your scheduled appointment time.   If you have a lab appointment with the [facility] please come in thru the  Main Entrance and check in at the main information desk  You need to re-schedule your appointment should you arrive 10 or more minutes late.  We strive to give you quality time with our providers, and arriving late affects you and other patients whose appointments are after yours.  Also, if you no show three or more times for appointments you may be dismissed from the clinic at the providers discretion.     Again, thank you for choosing [facility].  Our hope is that these requests will decrease the amount of time that you wait before being seen by our physicians.       _____________________________________________________________  Should you have questions after your visit to [facility], please contact our office at [phone] between the hours of 8:30 a.m. and 4:30 p.m.  Voicemails left after 4:30 p.m. will not be returned until the following business day.  For prescription refill requests, have your pharmacy contact our office.       Resources For Cancer Patients and their Caregivers ? American Cancer Society: Can assist with transportation, wigs, general needs, runs Look Good Feel Better.        304-466-7881 ? Cancer Care: Provides financial assistance, online support groups, medication/co-pay assistance.  1-800-813-HOPE 321-566-4863) ? [facility] Assists [place] cancer patients and their families through emotional , educational and financial support.  (802)182-9111 ? [place] DSS Where to apply for food  stamps, Medicaid and utility assistance. 832 814 7919 ? [employer]: Transportation to medical appointments. 737-002-1366 ? Social Security Administration: May apply for disability if have a Stage IV cancer. (928)807-6560 662-310-3565 ? [employer], Disability and Transit Services: Assists with nutrition, care and transit needs. [place] Support Programs:   > Cancer Support Group  2nd Tuesday of the month 1pm-2pm, Journey Room   > Creative Journey  3rd Tuesday of the month 1130am-1pm, Journey Room

## 2017-12-19 NOTE — Progress Notes (Signed)
Hunter Jones presents today for injection per the provider's orders.  Xgeva and Lupron administration without incident; see MAR for injection details.  Patient tolerated procedure well and without incident.  No questions or complaints noted at this time.  Discharged ambulatory.

## 2017-12-19 NOTE — Progress Notes (Signed)
Diagnosis Prostate cancer ([place]) - Plan: CBC with Differential/Platelet, Comprehensive metabolic panel, PSA  Bone metastases (HCC) - Plan: CBC with Differential/Platelet, Comprehensive metabolic panel, PSA  Staging Cancer Staging Prostate cancer [facility]) Staging form: Prostate, AJCC 7th Edition - Pathologic stage from 07/29/2014: Stage III (T3b, N0, cM0, Gleason 7) - Signed by Baird Cancer, PA-C on 11/09/2016 - Clinical: Stage IV (T3, N0, M1, PSA: Less than 10, Gleason 7) - Signed by [name], MD on 12/21/2016 - Pathologic: No stage assigned - Unsigned   Assessment and Plan:  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  He was initially diagnosed with prostate cancer with initial biopsy on 02/06/2014 in [place], underwent prostatectomy by Dr. Loletha Grayer on 06/27/2014, stage IIIb (PT3PN0), Gleason 4+3 is equal to 7, underwent radiation.  He developed metastatic disease in June 2018.  He was started on Lupron and denosumab.  His last CT scan and bone scan were done in June 2018.   He remains on Lupron every 3 months.  PSA done 09/2017 was 1.09.  He will be notified of today's PSA results when available.  CT scan done 11/29/2017 showed  IMPRESSION: 1. Status post radical prostatectomy. No findings to suggest local recurrence of disease or definite extraskeletal metastatic disease in the chest, abdomen or pelvis. However, there are several osseous lesions, as above, concerning for metastatic disease to the bones. 2. Hepatic steatosis. 3. Aortic atherosclerosis  Pt will follow-up with Dr. Worthy Keeler in 03/2018.  He should be due for repeat imaging as his last imaging was done in 11/2016.     2.  Bone metastasis.  He remains on monthly Xgeva.  Ca++ normal at 9.  Continue calcium supplementation as recommended.   3.  HTN.  BP is 129/67.  Follow-up with PCP.   4.  Hyperthyroidism.  PT is on methimazole.  Follow-up with PCP or endocrine as directed.    Current Status:  Pt is seen  today for follow-up prior to lupron and Xgeva.       Prostate cancer ([place])   02/06/2014 Procedure    Prostate biopsy by Dr. Clyde Lundborg      02/11/2014 Pathology Results    Prostatic adenocarcinoma identified in 5 of 6 prostate specimens with Gleason pattern showing primary pattern-grade 4, secondary pattern grade 3.  Total Gleason score equals 7.  Proportion of prostatic tissue involved by tumor: 70%.      05/03/2014 Imaging    Bone scan- Negative       06/27/2014 Procedure    Radical resection of prostate by Dr. Raynelle Bring      07/29/2014 Pathology Results    1. Prostate, radical resection - PROSTATIC ADENOCARCINOMA, GLEASON SCORE 4 + 3 = 7. - RIGHT AND LEFT PROSTATE INVOLVED. - RIGHT AND LEFT SEMINAL VESICLES INVOLVED BY TUMOR. - EXTRACAPSULAR EXTENSION BY TUMOR. - TUMOR EXTENDS INTO BLADDER NECK TISSUE. - MARGINS NOT INVOLVED. 2. Lymph nodes, regional resection, left pelvic - THREE BENIGN LYMPH NODES (0/3). 3. Lymph nodes, regional resection, right pelvic - FOUR BENIGN LYMPH NODES (0/4).      11/16/2016 Imaging    Bone scan- New areas of increased uptake superolateral to the left orbit (faint), at S1, and in the posterolateral aspect of the left sixth rib.       11/25/2016 Imaging    CT CAP- 1. Status post radical prostatectomy. No findings to suggest local recurrence of disease or definite extraskeletal metastatic disease in the chest, abdomen or pelvis. However, there are several  osseous lesions, as above, concerning for metastatic disease to the bones. 2. Hepatic steatosis. 3. Aortic atherosclerosis, in addition to 2 vessel coronary artery disease. Please note that although the presence of coronary artery calcium documents the presence of coronary artery disease, the severity of this disease and any potential stenosis cannot be assessed on this non-gated CT examination. Assessment for potential risk factor modification, dietary therapy or pharmacologic  therapy may be warranted, if clinically indicated. 4. Additional incidental findings, as above.        Problem List Patient Active Problem List   Diagnosis Date Noted  . Prostate cancer ([place]) [C61] 06/27/2014    Past Medical History Past Medical History:  Diagnosis Date  . COPD (chronic obstructive pulmonary disease) ([place])   . Coronary artery disease    DES to mid LAD August 2014 ([facility])  . Essential hypertension   . Hyperthyroidism   . NSTEMI (non-ST elevated myocardial infarction) ([place]) 2014  . Prostate cancer [facility])     Past Surgical History Past Surgical History:  Procedure Laterality Date  . APPENDECTOMY    . LYMPHADENECTOMY Bilateral 06/27/2014   Procedure: LYMPHADENECTOMY;  Surgeon: Raynelle Bring, MD;  Location[facility];  Service: Urology;  Laterality: Bilateral;  . ROBOT ASSISTED LAPAROSCOPIC RADICAL PROSTATECTOMY N/A 06/27/2014   Procedure: ROBOTIC ASSISTED LAPAROSCOPIC RADICAL PROSTATECTOMY LEVEL 3;  Surgeon: Raynelle Bring, MD;  Location[facility];  Service: Urology;  Laterality: N/A;  . TONSILLECTOMY      Family History Family History  Problem Relation Age of Onset  . Dementia Mother   . Thyroid disease Mother   . Kidney Stones Father   . Stroke Brother      Social History  reports that he quit smoking about 5 years ago. His smoking use included cigarettes. He quit after 40.00 years of use. He has never used smokeless tobacco. He reports that he drinks alcohol. He reports that he does not use drugs.  Medications  Current Outpatient Medications:  .  albuterol (PROVENTIL HFA;VENTOLIN HFA) 108 (90 BASE) MCG/ACT inhaler, Inhale 1-2 puffs into the lungs every 6 (six) hours as needed for wheezing or shortness of breath., Disp: , Rfl:  .  aspirin EC 81 MG tablet, Take 81 mg by mouth daily., Disp: , Rfl:  .  atorvastatin (LIPITOR) 40 MG tablet, Take 40 mg by mouth at bedtime., Disp: , Rfl:  .  Co-Enzyme Q-10 100 MG CAPS, Take 1 capsule by mouth daily., Disp: ,  Rfl:  .  fluticasone-salmeterol (ADVAIR HFA) 115-21 MCG/ACT inhaler, Inhale 2 puffs into the lungs 2 (two) times daily. , Disp: , Rfl:  .  KRILL OIL PO, Take 1 capsule by mouth daily. , Disp: , Rfl:  .  Leuprolide Acetate, 3 Month, (LUPRON DEPOT, 86-MONTH, IM), Inject into the muscle every 3 (three) months., Disp: , Rfl:  .  losartan (COZAAR) 25 MG tablet, Take 25 mg by mouth daily., Disp: , Rfl:  .  MEGESTROL ACETATE PO, Take 20 mg by mouth daily. , Disp: , Rfl:  .  methimazole (TAPAZOLE) 10 MG tablet, Take 10 mg by mouth every other day. , Disp: , Rfl:  .  metoprolol succinate (TOPROL-XL) 50 MG 24 hr tablet, Take 50 mg by mouth every morning. Take with or immediately following a meal., Disp: , Rfl:  .  Multiple Vitamin (MULTIVITAMIN) tablet, Take 1 tablet by mouth daily., Disp: , Rfl:   Allergies Penicillins  Review of Systems Review of Systems - Oncology ROS negative   Physical  Exam  Vitals Wt Readings from Last 3 Encounters:  12/19/17 (!) 307 lb (139.3 kg)  12/09/17 (!) 309 lb (140.2 kg)  09/26/17 (!) 302 lb (137 kg)   Temp Readings from Last 3 Encounters:  12/19/17 97.9 F (36.6 C) (Oral)  11/21/17 98 F (36.7 C) (Oral)  10/24/17 98 F (36.7 C) (Oral)   BP Readings from Last 3 Encounters:  12/19/17 129/67  12/09/17 122/75  11/21/17 134/60   Pulse Readings from Last 3 Encounters:  12/19/17 87  12/09/17 85  11/21/17 85   Constitutional: Well-developed, well-nourished, and in no distress.   HENT:Head: Normocephalic and atraumatic.  Mouth/Throat: No oropharyngeal exudate. Mucosa moist. Eyes: Pupils are equal, round, and reactive to light. Conjunctivae are normal. No scleral icterus.  Neck: Normal range of motion. Neck supple. No JVD present.  Cardiovascular: Normal rate, regular rhythm and normal heart sounds.  Exam reveals no gallop and no friction rub.   No murmur heard. Pulmonary/Chest: Effort normal and breath sounds normal. No respiratory distress. No  wheezes.No rales.  Abdominal: Soft. Bowel sounds are normal. No distension. There is no tenderness. There is no guarding.  Musculoskeletal: No edema or tenderness.  Lymphadenopathy: No cervical, axillary or supraclavicular adenopathy.  Neurological: Alert and oriented to person, place, and time. No cranial nerve deficit.  Skin: Skin is warm and dry. No rash noted. No erythema. No pallor.  Psychiatric: Affect and judgment normal.   Labs Appointment on 12/19/2017  Component Date Value Ref Range Status  . Sodium 12/19/2017 140  135 - 145 mmol/L Final  . Potassium 12/19/2017 4.7  3.5 - 5.1 mmol/L Final  . Chloride 12/19/2017 108  98 - 111 mmol/L Final   Please note change in reference range.  . CO2 12/19/2017 23  22 - 32 mmol/L Final  . Glucose, Bld 12/19/2017 165* 70 - 99 mg/dL Final   Please note change in reference range.  . BUN 12/19/2017 29* 8 - 23 mg/dL Final   Please note change in reference range.  . Creatinine, Ser 12/19/2017 1.24  0.61 - 1.24 mg/dL Final  . Calcium 12/19/2017 9.0  8.9 - 10.3 mg/dL Final  . Total Protein 12/19/2017 6.8  6.5 - 8.1 g/dL Final  . Albumin 12/19/2017 3.9  3.5 - 5.0 g/dL Final  . AST 12/19/2017 20  15 - 41 U/L Final  . ALT 12/19/2017 24  0 - 44 U/L Final   Please note change in reference range.  . Alkaline Phosphatase 12/19/2017 80  38 - 126 U/L Final  . Total Bilirubin 12/19/2017 0.7  0.3 - 1.2 mg/dL Final  . GFR calc non Af Amer 12/19/2017 >60  >60 mL/min Final  . GFR calc Af Amer 12/19/2017 >60  >60 mL/min Final   Comment: (NOTE) The eGFR has been calculated using the CKD EPI equation. This calculation has not been validated in all clinical situations. eGFR's persistently <60 mL/min signify possible Chronic Kidney Disease.   Georgiann Hahn gap 12/19/2017 9  5 - 15 Final   Performed at [facility], [address], [place], [place] [address]     Pathology Orders Placed This Encounter  Procedures  . CBC with Differential/Platelet    Standing  Status:   Future    Standing Expiration Date:   12/20/2018  . Comprehensive metabolic panel    Standing Status:   Future    Standing Expiration Date:   12/20/2018  . PSA    Standing Status:   Future    Standing  Expiration Date:   12/20/2018       Zoila Shutter MD

## 2017-12-20 ENCOUNTER — Telehealth (HOSPITAL_COMMUNITY): Payer: Self-pay

## 2017-12-20 NOTE — Telephone Encounter (Signed)
Notified patient of PSA result per Dr. Walden Field request. Patient verbalized understanding.

## 2018-01-16 ENCOUNTER — Inpatient Hospital Stay (HOSPITAL_COMMUNITY): Payer: BLUE CROSS/BLUE SHIELD | Attending: Hematology

## 2018-01-16 ENCOUNTER — Inpatient Hospital Stay (HOSPITAL_COMMUNITY): Payer: BLUE CROSS/BLUE SHIELD

## 2018-01-16 ENCOUNTER — Encounter (HOSPITAL_COMMUNITY): Payer: Self-pay

## 2018-01-16 VITALS — BP 146/70 | HR 93 | Temp 97.6°F | Resp 18 | Wt 308.2 lb

## 2018-01-16 DIAGNOSIS — C61 Malignant neoplasm of prostate: Secondary | ICD-10-CM

## 2018-01-16 DIAGNOSIS — C7951 Secondary malignant neoplasm of bone: Secondary | ICD-10-CM | POA: Diagnosis not present

## 2018-01-16 LAB — COMPREHENSIVE METABOLIC PANEL
ALK PHOS: 84 U/L (ref 38–126)
ALT: 23 U/L (ref 0–44)
ANION GAP: 5 (ref 5–15)
AST: 21 U/L (ref 15–41)
Albumin: 3.7 g/dL (ref 3.5–5.0)
BUN: 26 mg/dL — ABNORMAL HIGH (ref 8–23)
CALCIUM: 9.1 mg/dL (ref 8.9–10.3)
CO2: 23 mmol/L (ref 22–32)
Chloride: 111 mmol/L (ref 98–111)
Creatinine, Ser: 1.13 mg/dL (ref 0.61–1.24)
Glucose, Bld: 162 mg/dL — ABNORMAL HIGH (ref 70–99)
Potassium: 4.5 mmol/L (ref 3.5–5.1)
SODIUM: 139 mmol/L (ref 135–145)
TOTAL PROTEIN: 6.6 g/dL (ref 6.5–8.1)
Total Bilirubin: 0.7 mg/dL (ref 0.3–1.2)

## 2018-01-16 MED ORDER — DENOSUMAB 120 MG/1.7ML ~~LOC~~ SOLN
120.0000 mg | Freq: Once | SUBCUTANEOUS | Status: AC
  Administered 2018-01-16: 120 mg via SUBCUTANEOUS
  Filled 2018-01-16: qty 1.7

## 2018-01-16 NOTE — Patient Instructions (Signed)
[facility]  Discharge Instructions: You received an xgeva shot today.  _______________________________________________________________  Thank you for choosing [facility] at [facility] to provide your oncology and hematology care.  To afford each patient quality time with our providers, please arrive at least 15 minutes before your scheduled appointment.  You need to re-schedule your appointment if you arrive 10 or more minutes late.  We strive to give you quality time with our providers, and arriving late affects you and other patients whose appointments are after yours.  Also, if you no show three or more times for appointments you may be dismissed from the clinic.  Again, thank you for choosing [facility]. Our hope is that these requests will allow you access to exceptional care and in a timely manner. _______________________________________________________________  If you have questions after your visit, please contact our office at [phone] between the hours of 8:30 a.m. and 5:00 p.m. Voicemails left after 4:30 p.m. will not be returned until the following business day. _______________________________________________________________  For prescription refill requests, have your pharmacy contact our office. _______________________________________________________________  Recommendations made by the consultant and any test results will be sent to your referring physician. _______________________________________________________________ 

## 2018-01-16 NOTE — Progress Notes (Signed)
Patient taking calcium three to four times a week.  Denied tooth, jaw, or leg pain.  No recent or upcoming dental visits.  Patient tolerated xgeva with no complaints voiced.  Injection site clean and dry with no bruising or swelling noted at site.  Band aid applied. VSS with discharge and left ambulatory with n os/s of distress noted.

## 2018-02-09 ENCOUNTER — Other Ambulatory Visit: Payer: Self-pay | Admitting: Cardiology

## 2018-02-09 ENCOUNTER — Telehealth: Payer: Self-pay | Admitting: Cardiology

## 2018-02-09 MED ORDER — ATORVASTATIN CALCIUM 40 MG PO TABS: 40.0000 mg | ORAL_TABLET | Freq: Every day | ORAL | 3 refills | Status: DC

## 2018-02-09 NOTE — Telephone Encounter (Signed)
°*  STAT* If patient is at the pharmacy, call can be transferred to refill team.   1. Which medications need to be refilled? atorvastatin (LIPITOR) 40 MG tablet    2. Which pharmacy/location (including street and city if local pharmacy) is medication to be sent to? [facility] [place]  3. Do they need a 30 day or 90 day supply? [place]

## 2018-02-09 NOTE — Telephone Encounter (Signed)
Rx sent 

## 2018-02-14 ENCOUNTER — Inpatient Hospital Stay (HOSPITAL_COMMUNITY): Payer: BLUE CROSS/BLUE SHIELD | Attending: Hematology

## 2018-02-14 ENCOUNTER — Inpatient Hospital Stay (HOSPITAL_COMMUNITY): Payer: BLUE CROSS/BLUE SHIELD

## 2018-02-14 VITALS — BP 147/81 | HR 88 | Temp 98.1°F | Resp 18

## 2018-02-14 DIAGNOSIS — C7951 Secondary malignant neoplasm of bone: Secondary | ICD-10-CM | POA: Diagnosis not present

## 2018-02-14 DIAGNOSIS — C61 Malignant neoplasm of prostate: Secondary | ICD-10-CM | POA: Diagnosis present

## 2018-02-14 LAB — COMPREHENSIVE METABOLIC PANEL
ALK PHOS: 88 U/L (ref 38–126)
ALT: 24 U/L (ref 0–44)
AST: 22 U/L (ref 15–41)
Albumin: 3.7 g/dL (ref 3.5–5.0)
Anion gap: 7 (ref 5–15)
BUN: 22 mg/dL (ref 8–23)
CALCIUM: 8.9 mg/dL (ref 8.9–10.3)
CO2: 24 mmol/L (ref 22–32)
Chloride: 109 mmol/L (ref 98–111)
Creatinine, Ser: 1.15 mg/dL (ref 0.61–1.24)
GFR calc Af Amer: >60 mL/min (ref >60)
GFR calc non Af Amer: >60 mL/min (ref >60)
Glucose, Bld: 169 mg/dL — ABNORMAL HIGH (ref 70–99)
Potassium: 4.9 mmol/L (ref 3.5–5.1)
SODIUM: 140 mmol/L (ref 135–145)
Total Bilirubin: 0.6 mg/dL (ref 0.3–1.2)
Total Protein: 6.8 g/dL (ref 6.5–8.1)

## 2018-02-14 MED ORDER — DENOSUMAB 120 MG/1.7ML ~~LOC~~ SOLN
120.0000 mg | Freq: Once | SUBCUTANEOUS | Status: AC
  Administered 2018-02-14: 120 mg via SUBCUTANEOUS
  Filled 2018-02-14: qty 1.7

## 2018-02-15 ENCOUNTER — Encounter (HOSPITAL_COMMUNITY): Payer: Self-pay

## 2018-02-15 ENCOUNTER — Other Ambulatory Visit: Payer: Self-pay

## 2018-02-15 NOTE — Progress Notes (Signed)
Hunter Jones presents today for injection per the provider's orders.  Xgeva administration without incident; see MAR for injection details.  Patient tolerated procedure well and without incident.  No questions or complaints noted at this time.  Discharged ambulatory.  

## 2018-03-21 ENCOUNTER — Other Ambulatory Visit (HOSPITAL_COMMUNITY): Payer: Self-pay | Admitting: Hematology

## 2018-03-22 ENCOUNTER — Ambulatory Visit (HOSPITAL_COMMUNITY): Payer: BLUE CROSS/BLUE SHIELD

## 2018-03-22 ENCOUNTER — Encounter (HOSPITAL_COMMUNITY): Payer: Self-pay | Admitting: Hematology

## 2018-03-22 ENCOUNTER — Other Ambulatory Visit (HOSPITAL_COMMUNITY): Payer: BLUE CROSS/BLUE SHIELD

## 2018-03-22 ENCOUNTER — Ambulatory Visit (HOSPITAL_COMMUNITY): Payer: BLUE CROSS/BLUE SHIELD | Admitting: Hematology

## 2018-03-22 ENCOUNTER — Inpatient Hospital Stay (HOSPITAL_COMMUNITY): Payer: BLUE CROSS/BLUE SHIELD

## 2018-03-22 ENCOUNTER — Inpatient Hospital Stay (HOSPITAL_BASED_OUTPATIENT_CLINIC_OR_DEPARTMENT_OTHER): Payer: BLUE CROSS/BLUE SHIELD | Admitting: Hematology

## 2018-03-22 ENCOUNTER — Inpatient Hospital Stay (HOSPITAL_COMMUNITY): Payer: BLUE CROSS/BLUE SHIELD | Attending: Hematology

## 2018-03-22 VITALS — BP 139/65 | HR 88 | Temp 98.0°F | Resp 20 | Wt 306.6 lb

## 2018-03-22 DIAGNOSIS — C7951 Secondary malignant neoplasm of bone: Secondary | ICD-10-CM

## 2018-03-22 DIAGNOSIS — C61 Malignant neoplasm of prostate: Secondary | ICD-10-CM

## 2018-03-22 DIAGNOSIS — Z23 Encounter for immunization: Secondary | ICD-10-CM | POA: Diagnosis not present

## 2018-03-22 DIAGNOSIS — Z5111 Encounter for antineoplastic chemotherapy: Secondary | ICD-10-CM | POA: Diagnosis present

## 2018-03-22 LAB — CBC WITH DIFFERENTIAL/PLATELET
ABS IMMATURE GRANULOCYTES: 0.02 10*3/uL (ref 0.00–0.07)
BASOS PCT: 1 %
Basophils Absolute: 0 10*3/uL (ref 0.0–0.1)
Eosinophils Absolute: 0.3 10*3/uL (ref 0.0–0.5)
Eosinophils Relative: 5 %
HEMATOCRIT: 40.1 % (ref 39.0–52.0)
Hemoglobin: 13.5 g/dL (ref 13.0–17.0)
IMMATURE GRANULOCYTES: 0 %
LYMPHS ABS: 2.1 10*3/uL (ref 0.7–4.0)
Lymphocytes Relative: 32 %
MCH: 33.1 pg (ref 26.0–34.0)
MCHC: 33.7 g/dL (ref 30.0–36.0)
MCV: 98.3 fL (ref 80.0–100.0)
MONOS PCT: 10 %
Monocytes Absolute: 0.7 10*3/uL (ref 0.1–1.0)
NEUTROS ABS: 3.4 10*3/uL (ref 1.7–7.7)
NEUTROS PCT: 52 %
PLATELETS: 125 10*3/uL — AB (ref 150–400)
RBC: 4.08 MIL/uL — ABNORMAL LOW (ref 4.22–5.81)
RDW: 13.2 % (ref 11.5–15.5)
WBC: 6.5 10*3/uL (ref 4.0–10.5)
nRBC: 0 % (ref 0.0–0.2)

## 2018-03-22 LAB — COMPREHENSIVE METABOLIC PANEL
ALBUMIN: 3.7 g/dL (ref 3.5–5.0)
ALT: 23 U/L (ref 0–44)
AST: 24 U/L (ref 15–41)
Alkaline Phosphatase: 90 U/L (ref 38–126)
Anion gap: 7 (ref 5–15)
BUN: 25 mg/dL — AB (ref 8–23)
CHLORIDE: 108 mmol/L (ref 98–111)
CO2: 23 mmol/L (ref 22–32)
CREATININE: 1.14 mg/dL (ref 0.61–1.24)
Calcium: 8.7 mg/dL — ABNORMAL LOW (ref 8.9–10.3)
GFR calc Af Amer: >60 mL/min (ref >60)
GLUCOSE: 220 mg/dL — AB (ref 70–99)
Potassium: 5.2 mmol/L — ABNORMAL HIGH (ref 3.5–5.1)
Sodium: 138 mmol/L (ref 135–145)
Total Bilirubin: 0.8 mg/dL (ref 0.3–1.2)
Total Protein: 6.6 g/dL (ref 6.5–8.1)

## 2018-03-22 LAB — PSA: PROSTATIC SPECIFIC ANTIGEN: 2.62 ng/mL (ref 0.00–4.00)

## 2018-03-22 MED ORDER — LEUPROLIDE ACETATE (3 MONTH) 22.5 MG IM KIT
22.5000 mg | PACK | Freq: Once | INTRAMUSCULAR | Status: AC
  Administered 2018-03-22: 22.5 mg via INTRAMUSCULAR
  Filled 2018-03-22: qty 22.5

## 2018-03-22 MED ORDER — INFLUENZA VAC SPLIT QUAD 0.5 ML IM SUSY
PREFILLED_SYRINGE | INTRAMUSCULAR | Status: AC
  Filled 2018-03-22: qty 0.5

## 2018-03-22 MED ORDER — DENOSUMAB 120 MG/1.7ML ~~LOC~~ SOLN
120.0000 mg | Freq: Once | SUBCUTANEOUS | Status: AC
  Administered 2018-03-22: 120 mg via SUBCUTANEOUS
  Filled 2018-03-22: qty 1.7

## 2018-03-22 MED ORDER — INFLUENZA VAC SPLIT QUAD 0.5 ML IM SUSY
0.5000 mL | PREFILLED_SYRINGE | Freq: Once | INTRAMUSCULAR | Status: AC
  Administered 2018-03-22: 0.5 mL via INTRAMUSCULAR

## 2018-03-22 NOTE — Progress Notes (Signed)
150 Labs reviewed with and pt seen by Dr. Delton Coombes and pt approved for Lupron and Xgeva injections as well as Influenza vaccine today per MD                                                            Phillips Climes tolerated Lupron and Xgeva injections as well as Influenza vaccine well without complaints or incident. Calcium 8.7 today and pt continues to take his Calcium PO as prescribed without issues. Pt denied any tooth or jaw pain and no recent or future dental visits prior to administering the Xgeva. Pt discharged self ambulatory in satisfactory condition

## 2018-03-22 NOTE — Assessment & Plan Note (Signed)
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 Loletha Grayer on 06/27/2014, stage IIIb (PT3PN0), Gleason 4+3 is equal to 7, underwent radiation.  He developed metastatic disease in June 2018.  He was started on Lupron and denosumab.  His last CT scan and bone scan were done in June 2018.  Bone scan was negative for metastatic disease.  However CT scan showed sclerotic lesions. - Tolerating Lupron every 3 months very well.  He does have some emotional problems and hot flashes which are stable.  Last PSA on 12/19/2017 was 1.38.  It was up from 1.09 on 09/26/2017.  PSA from today's pending.  If it continues to go up, we will consider adding Abiraterone or enzalutamide. - He will come back in 2 months for repeat PSA level 1 day prior to his next visit. - We will consider germline mutation testing down the line.  2.  Bone metastasis: -he will continue denosumab every month.  He is tolerating it well.  His calcium is staying in the normal limits.

## 2018-03-22 NOTE — Patient Instructions (Addendum)
[place] at [facility] Discharge Instructions  Received Lupron and Xgeva injections as well as Influenza vaccine. Follow-up as scheduled. Call clinic for any questions or concerns   Thank you for choosing [place] at [facility] to provide your oncology and hematology care.  To afford each patient quality time with our provider, please arrive at least 15 minutes before your scheduled appointment time.   If you have a lab appointment with the [facility] please come in thru the  Main Entrance and check in at the main information desk  You need to re-schedule your appointment should you arrive 10 or more minutes late.  We strive to give you quality time with our providers, and arriving late affects you and other patients whose appointments are after yours.  Also, if you no show three or more times for appointments you may be dismissed from the clinic at the providers discretion.     Again, thank you for choosing [facility].  Our hope is that these requests will decrease the amount of time that you wait before being seen by our physicians.       _____________________________________________________________  Should you have questions after your visit to [facility], please contact our office at [phone] between the hours of 8:00 a.m. and 4:30 p.m.  Voicemails left after 4:00 p.m. will not be returned until the following business day.  For prescription refill requests, have your pharmacy contact our office and allow 72 hours.    Cancer Center Support Programs:   > Cancer Support Group  2nd Tuesday of the month 1pm-2pm, Journey Room

## 2018-03-22 NOTE — Progress Notes (Signed)
Hunter Jones, Hunter Jones [id number]   CLINIC:  Medical Oncology/Hematology  PCP:  Monico Blitz, [place] [address] [phone]   REASON FOR VISIT: Follow-up for metastatic prostate cancer  CURRENT THERAPY: Lupron and denosumab  BRIEF ONCOLOGIC HISTORY:    Prostate cancer ([place])   02/06/2014 Procedure    Prostate biopsy by Dr. Clyde Lundborg    02/11/2014 Pathology Results    Prostatic adenocarcinoma identified in 5 of 6 prostate specimens with Gleason pattern showing primary pattern-grade 4, secondary pattern grade 3.  Total Gleason score equals 7.  Proportion of prostatic tissue involved by tumor: 70%.    05/03/2014 Imaging    Bone scan- Negative     06/27/2014 Procedure    Radical resection of prostate by Dr. Raynelle Bring    07/29/2014 Pathology Results    1. Prostate, radical resection - PROSTATIC ADENOCARCINOMA, GLEASON SCORE 4 + 3 = 7. - RIGHT AND LEFT PROSTATE INVOLVED. - RIGHT AND LEFT SEMINAL VESICLES INVOLVED BY TUMOR. - EXTRACAPSULAR EXTENSION BY TUMOR. - TUMOR EXTENDS INTO BLADDER NECK TISSUE. - MARGINS NOT INVOLVED. 2. Lymph nodes, regional resection, left pelvic - THREE BENIGN LYMPH NODES (0/3). 3. Lymph nodes, regional resection, right pelvic - FOUR BENIGN LYMPH NODES (0/4).    11/16/2016 Imaging    Bone scan- New areas of increased uptake superolateral to the left orbit (faint), at S1, and in the posterolateral aspect of the left sixth rib.     11/25/2016 Imaging    CT CAP- 1. Status post radical prostatectomy. No findings to suggest local recurrence of disease or definite extraskeletal metastatic disease in the chest, abdomen or pelvis. However, there are several osseous lesions, as above, concerning for metastatic disease to the bones. 2. Hepatic steatosis. 3. Aortic atherosclerosis, in addition to 2 vessel coronary artery disease. Please note that although the presence of coronary artery calcium  documents the presence of coronary artery disease, the severity of this disease and any potential stenosis cannot be assessed on this non-gated CT examination. Assessment for potential risk factor modification, dietary therapy or pharmacologic therapy may be warranted, if clinically indicated. 4. Additional incidental findings, as above.      CANCER STAGING: Cancer Staging Prostate cancer [facility]) Staging form: Prostate, AJCC 7th Edition - Pathologic stage from 07/29/2014: Stage III (T3b, N0, cM0, Gleason 7) - Signed by Baird Cancer, PA-C on 11/09/2016 - Clinical: Stage IV (T3, N0, M1, PSA: Less than 10, Gleason 7) - Signed by [name], MD on 12/21/2016 - Pathologic: No stage assigned - Unsigned    INTERVAL HISTORY:  Hunter Jones 62 y.o. male returns for routine follow-up for metastatic prostate cancer. Patient is here today and tolerating his treatment well. He has no side effects or complaints except occasional dizziness. He reports his appetite at 100% and energy level at 75%. He has no problem maintaining his weight. He denies any new pains. Denies any hematuria or bleeding anywhere. Denies any nausea, vomiting, or diarrhea.    REVIEW OF SYSTEMS:  Review of Systems  Constitutional: Positive for fatigue.  Neurological: Positive for dizziness and extremity weakness.  All other systems reviewed and are negative.    PAST MEDICAL/SURGICAL HISTORY:  Past Medical History:  Diagnosis Date  . COPD (chronic obstructive pulmonary disease) ([place])   . Coronary artery disease    DES to mid LAD August 2014 ([facility])  . Essential hypertension   . Hyperthyroidism   . NSTEMI (non-ST elevated myocardial  infarction) ([place]) 2014  . Prostate cancer [facility])    Past Surgical History:  Procedure Laterality Date  . APPENDECTOMY    . LYMPHADENECTOMY Bilateral 06/27/2014   Procedure: LYMPHADENECTOMY;  Surgeon: Raynelle Bring, MD;  Location: [facility]S;  Service: Urology;  Laterality: Bilateral;  . ROBOT  ASSISTED LAPAROSCOPIC RADICAL PROSTATECTOMY N/A 06/27/2014   Procedure: ROBOTIC ASSISTED LAPAROSCOPIC RADICAL PROSTATECTOMY LEVEL 3;  Surgeon: Raynelle Bring, MD;  Location: [facility]S;  Service: Urology;  Laterality: N/A;  . TONSILLECTOMY       SOCIAL HISTORY:  Social History   Socioeconomic History  . Marital status: Married    Spouse name: Not on file  . Number of children: Not on file  . Years of education: Not on file  . Highest education level: Not on file  Occupational History  . Not on file  Social Needs  . Financial resource strain: Not on file  . Food insecurity:    Worry: Not on file    Inability: Not on file  . Transportation needs:    Medical: Not on file    Non-medical: Not on file  Tobacco Use  . Smoking status: Former Smoker    Years: 40.00    Types: Cigarettes    Last attempt to quit: 06/21/2012    Years since quitting: 5.7  . Smokeless tobacco: Never Used  Substance and Sexual Activity  . Alcohol use: Yes    Comment: occasional  . Drug use: No  . Sexual activity: Not on file  Lifestyle  . Physical activity:    Days per week: Not on file    Minutes per session: Not on file  . Stress: Not on file  Relationships  . Social connections:    Talks on phone: Not on file    Gets together: Not on file    Attends religious service: Not on file    Active member of club or organization: Not on file    Attends meetings of clubs or organizations: Not on file    Relationship status: Not on file  . Intimate partner violence:    Fear of current or ex partner: Not on file    Emotionally abused: Not on file    Physically abused: Not on file    Forced sexual activity: Not on file  Other Topics Concern  . Not on file  Social History Narrative  . Not on file    FAMILY HISTORY:  Family History  Problem Relation Age of Onset  . Dementia Mother   . Thyroid disease Mother   . Kidney Stones Father   . Stroke Brother     CURRENT MEDICATIONS:  Outpatient Encounter  Medications as of 03/22/2018  Medication Sig  . albuterol (PROVENTIL HFA;VENTOLIN HFA) 108 (90 BASE) MCG/ACT inhaler Inhale 1-2 puffs into the lungs every 6 (six) hours as needed for wheezing or shortness of breath.  [name] Kitchen aspirin EC 81 MG tablet Take 81 mg by mouth daily.  [name] Kitchen atorvastatin (LIPITOR) 40 MG tablet TAKE 1 TABLET BY MOUTH AT BEDTIME.  [name] Kitchen atorvastatin (LIPITOR) 40 MG tablet Take 1 tablet (40 mg total) by mouth at bedtime.  [name] Kitchen Co-Enzyme Q-10 100 MG CAPS Take 1 capsule by mouth daily.  . fluticasone-salmeterol (ADVAIR HFA) 115-21 MCG/ACT inhaler Inhale 2 puffs into the lungs 2 (two) times daily.   [name] Kitchen KRILL OIL PO Take 1 capsule by mouth daily.   [name] Kitchen Leuprolide Acetate, 3 Month, (LUPRON DEPOT, 41-MONTH, IM) Inject into the muscle every 3 (three) months.  [name] Kitchen  losartan (COZAAR) 25 MG tablet Take 25 mg by mouth daily.  . megestrol (MEGACE) 20 MG tablet TAKE 1 TABLET EVERY 12 HOURS.  [name] Kitchen MEGESTROL ACETATE PO Take 20 mg by mouth daily.   . methimazole (TAPAZOLE) 10 MG tablet Take 10 mg by mouth every other day.   . metoprolol succinate (TOPROL-XL) 50 MG 24 hr tablet Take 50 mg by mouth every morning. Take with or immediately following a meal.  . Multiple Vitamin (MULTIVITAMIN) tablet Take 1 tablet by mouth daily.   No facility-administered encounter medications on file as of 03/22/2018.     ALLERGIES:  Allergies  Allergen Reactions  . Penicillins Hives     PHYSICAL EXAM:  ECOG Performance status: 1  Vitals:   03/22/18 1138  BP: 139/65  Pulse: 88  Resp: 20  Temp: 98 F (36.7 C)   Filed Weights   03/22/18 1138  Weight: (!) 306 lb 9.6 oz (139.1 kg)    Physical Exam  Constitutional: He is oriented to person, place, and time. He appears well-developed and well-nourished.  Abdominal: Soft.  Musculoskeletal: Normal range of motion.  Neurological: He is alert and oriented to person, place, and time.  Skin: Skin is warm and dry.  Psychiatric: He has a normal mood and affect. His behavior  is normal. Judgment and thought content normal.  Abdomen is soft nontender with no palpable abnormality.   LABORATORY DATA:  I have reviewed the labs as listed.  CBC    Component Value Date/Time   WBC 6.5 03/22/2018 1052   RBC 4.08 (L) 03/22/2018 1052   HGB 13.5 03/22/2018 1052   HCT 40.1 03/22/2018 1052   PLT 125 (L) 03/22/2018 1052   MCV 98.3 03/22/2018 1052   MCH 33.1 03/22/2018 1052   MCHC 33.7 03/22/2018 1052   RDW 13.2 03/22/2018 1052   LYMPHSABS 2.1 03/22/2018 1052   MONOABS 0.7 03/22/2018 1052   EOSABS 0.3 03/22/2018 1052   BASOSABS 0.0 03/22/2018 1052   CMP Latest Ref Rng & Units 03/22/2018 02/14/2018 01/16/2018  Glucose 70 - 99 mg/dL 220(H) 169(H) 162(H)  BUN 8 - 23 mg/dL 25(H) 22 26(H)  Creatinine 0.61 - 1.24 mg/dL 1.14 1.15 1.13  Sodium 135 - 145 mmol/L 138 140 139  Potassium 3.5 - 5.1 mmol/L 5.2(H) 4.9 4.5  Chloride 98 - 111 mmol/L 108 109 111  CO2 22 - 32 mmol/L 23 24 23   Calcium 8.9 - 10.3 mg/dL 8.7(L) 8.9 9.1  Total Protein 6.5 - 8.1 g/dL 6.6 6.8 6.6  Total Bilirubin 0.3 - 1.2 mg/dL 0.8 0.6 0.7  Alkaline Phos 38 - 126 U/L 90 88 84  AST 15 - 41 U/L 24 22 21   ALT 0 - 44 U/L 23 24 23         ASSESSMENT & PLAN:   Prostate cancer ([place]) 1.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 Loletha Grayer on 06/27/2014, stage IIIb (PT3PN0), Gleason 4+3 is equal to 7, underwent radiation.  He developed metastatic disease in June 2018.  He was started on Lupron and denosumab.  His last CT scan and bone scan were done in June 2018.  Bone scan was negative for metastatic disease.  However CT scan showed sclerotic lesions. - Tolerating Lupron every 3 months very well.  He does have some emotional problems and hot flashes which are stable.  Last PSA on 12/19/2017 was 1.38.  It was up from 1.09 on 09/26/2017.  PSA from today's pending.  If it  continues to go up, we will consider adding Abiraterone or enzalutamide. - He will come back in 2 months for repeat PSA level 1 day prior to his next visit. - We will consider germline mutation testing down the line.  2.  Bone metastasis: -he will continue denosumab every month.  He is tolerating it well.  His calcium is staying in the normal limits.      Orders placed this encounter:  Orders Placed This Encounter  Procedures  . PSA  . CBC with Differential/Platelet  . Comprehensive metabolic panel      Derek Jack, MD [place] [phone]

## 2018-03-22 NOTE — Patient Instructions (Signed)
[facility] at [facility] Discharge Instructions  Follow up in 2 months with labs prior.    Thank you for choosing [facility] at [facility] to provide your oncology and hematology care.  To afford each patient quality time with our provider, please arrive at least 15 minutes before your scheduled appointment time.   If you have a lab appointment with the Cancer Center please come in thru the  Main Entrance and check in at the main information desk  You need to re-schedule your appointment should you arrive 10 or more minutes late.  We strive to give you quality time with our providers, and arriving late affects you and other patients whose appointments are after yours.  Also, if you no show three or more times for appointments you may be dismissed from the clinic at the providers discretion.     Again, thank you for choosing [facility].  Our hope is that these requests will decrease the amount of time that you wait before being seen by our physicians.       _____________________________________________________________  Should you have questions after your visit to [facility], please contact our office at [phone] between the hours of 8:00 a.m. and 4:30 p.m.  Voicemails left after 4:00 p.m. will not be returned until the following business day.  For prescription refill requests, have your pharmacy contact our office and allow 72 hours.    Cancer Center Support Programs:   > Cancer Support Group  2nd Tuesday of the month 1pm-2pm, Journey Room    

## 2018-04-19 ENCOUNTER — Ambulatory Visit (HOSPITAL_COMMUNITY): Payer: BLUE CROSS/BLUE SHIELD

## 2018-04-19 ENCOUNTER — Other Ambulatory Visit (HOSPITAL_COMMUNITY): Payer: BLUE CROSS/BLUE SHIELD

## 2018-04-21 ENCOUNTER — Other Ambulatory Visit: Payer: Self-pay

## 2018-04-21 ENCOUNTER — Encounter (HOSPITAL_COMMUNITY): Payer: Self-pay

## 2018-04-21 ENCOUNTER — Inpatient Hospital Stay (HOSPITAL_COMMUNITY): Payer: BLUE CROSS/BLUE SHIELD

## 2018-04-21 ENCOUNTER — Inpatient Hospital Stay (HOSPITAL_COMMUNITY): Payer: BLUE CROSS/BLUE SHIELD | Attending: Hematology

## 2018-04-21 VITALS — BP 132/58 | HR 90 | Temp 98.1°F | Resp 18

## 2018-04-21 DIAGNOSIS — C61 Malignant neoplasm of prostate: Secondary | ICD-10-CM

## 2018-04-21 DIAGNOSIS — C7951 Secondary malignant neoplasm of bone: Secondary | ICD-10-CM | POA: Diagnosis not present

## 2018-04-21 LAB — COMPREHENSIVE METABOLIC PANEL
ALT: 27 U/L (ref 0–44)
AST: 26 U/L (ref 15–41)
Albumin: 3.7 g/dL (ref 3.5–5.0)
Alkaline Phosphatase: 87 U/L (ref 38–126)
Anion gap: 7 (ref 5–15)
BUN: 32 mg/dL — ABNORMAL HIGH (ref 8–23)
CHLORIDE: 107 mmol/L (ref 98–111)
CO2: 23 mmol/L (ref 22–32)
CREATININE: 1.37 mg/dL — AB (ref 0.61–1.24)
Calcium: 8.5 mg/dL — ABNORMAL LOW (ref 8.9–10.3)
GFR calc Af Amer: >60 mL/min (ref >60)
GFR, EST NON AFRICAN AMERICAN: 53 mL/min — AB (ref >60)
Glucose, Bld: 215 mg/dL — ABNORMAL HIGH (ref 70–99)
POTASSIUM: 4.2 mmol/L (ref 3.5–5.1)
SODIUM: 137 mmol/L (ref 135–145)
Total Bilirubin: 0.5 mg/dL (ref 0.3–1.2)
Total Protein: 6.4 g/dL — ABNORMAL LOW (ref 6.5–8.1)

## 2018-04-21 MED ORDER — DENOSUMAB 120 MG/1.7ML ~~LOC~~ SOLN
120.0000 mg | Freq: Once | SUBCUTANEOUS | Status: AC
  Administered 2018-04-21: 120 mg via SUBCUTANEOUS
  Filled 2018-04-21: qty 1.7

## 2018-04-21 NOTE — Progress Notes (Signed)
Hunter Jones presents today for injection per the provider's orders.  Xgeva administration without incident; see MAR for injection details.  Patient tolerated procedure well and without incident.  No questions or complaints noted at this time.  Discharged ambulatory.

## 2018-05-16 ENCOUNTER — Other Ambulatory Visit (HOSPITAL_COMMUNITY): Payer: BLUE CROSS/BLUE SHIELD

## 2018-05-17 ENCOUNTER — Other Ambulatory Visit (HOSPITAL_COMMUNITY): Payer: BLUE CROSS/BLUE SHIELD

## 2018-05-17 ENCOUNTER — Ambulatory Visit (HOSPITAL_COMMUNITY): Payer: BLUE CROSS/BLUE SHIELD

## 2018-05-17 ENCOUNTER — Ambulatory Visit (HOSPITAL_COMMUNITY): Payer: BLUE CROSS/BLUE SHIELD | Admitting: Hematology

## 2018-05-18 ENCOUNTER — Inpatient Hospital Stay (HOSPITAL_COMMUNITY): Payer: BLUE CROSS/BLUE SHIELD | Attending: Hematology

## 2018-05-18 DIAGNOSIS — C61 Malignant neoplasm of prostate: Secondary | ICD-10-CM | POA: Diagnosis present

## 2018-05-18 DIAGNOSIS — C7951 Secondary malignant neoplasm of bone: Secondary | ICD-10-CM | POA: Diagnosis not present

## 2018-05-18 LAB — CBC WITH DIFFERENTIAL/PLATELET
ABS IMMATURE GRANULOCYTES: 0.01 10*3/uL (ref 0.00–0.07)
Basophils Absolute: 0 10*3/uL (ref 0.0–0.1)
Basophils Relative: 1 %
EOS PCT: 3 %
Eosinophils Absolute: 0.2 10*3/uL (ref 0.0–0.5)
HEMATOCRIT: 37.8 % — AB (ref 39.0–52.0)
HEMOGLOBIN: 12.9 g/dL — AB (ref 13.0–17.0)
Immature Granulocytes: 0 %
LYMPHS ABS: 2.3 10*3/uL (ref 0.7–4.0)
Lymphocytes Relative: 30 %
MCH: 33.7 pg (ref 26.0–34.0)
MCHC: 34.1 g/dL (ref 30.0–36.0)
MCV: 98.7 fL (ref 80.0–100.0)
MONOS PCT: 9 %
Monocytes Absolute: 0.7 10*3/uL (ref 0.1–1.0)
NEUTROS ABS: 4.5 10*3/uL (ref 1.7–7.7)
NEUTROS PCT: 57 %
NRBC: 0 % (ref 0.0–0.2)
Platelets: 133 10*3/uL — ABNORMAL LOW (ref 150–400)
RBC: 3.83 MIL/uL — ABNORMAL LOW (ref 4.22–5.81)
RDW: 13.1 % (ref 11.5–15.5)
WBC: 7.7 10*3/uL (ref 4.0–10.5)

## 2018-05-18 LAB — COMPREHENSIVE METABOLIC PANEL
ALK PHOS: 91 U/L (ref 38–126)
ALT: 24 U/L (ref 0–44)
AST: 23 U/L (ref 15–41)
Albumin: 3.8 g/dL (ref 3.5–5.0)
Anion gap: 4 — ABNORMAL LOW (ref 5–15)
BILIRUBIN TOTAL: 0.5 mg/dL (ref 0.3–1.2)
BUN: 31 mg/dL — ABNORMAL HIGH (ref 8–23)
CALCIUM: 9.3 mg/dL (ref 8.9–10.3)
CO2: 21 mmol/L — ABNORMAL LOW (ref 22–32)
CREATININE: 1.28 mg/dL — AB (ref 0.61–1.24)
Chloride: 114 mmol/L — ABNORMAL HIGH (ref 98–111)
GFR, EST NON AFRICAN AMERICAN: 59 mL/min — AB (ref >60)
Glucose, Bld: 171 mg/dL — ABNORMAL HIGH (ref 70–99)
Potassium: 4.5 mmol/L (ref 3.5–5.1)
SODIUM: 139 mmol/L (ref 135–145)
TOTAL PROTEIN: 6.6 g/dL (ref 6.5–8.1)

## 2018-05-18 LAB — PSA: PROSTATIC SPECIFIC ANTIGEN: 3.15 ng/mL (ref 0.00–4.00)

## 2018-05-19 ENCOUNTER — Inpatient Hospital Stay (HOSPITAL_COMMUNITY): Payer: BLUE CROSS/BLUE SHIELD

## 2018-05-19 ENCOUNTER — Encounter (HOSPITAL_COMMUNITY): Payer: Self-pay | Admitting: Hematology

## 2018-05-19 ENCOUNTER — Inpatient Hospital Stay (HOSPITAL_BASED_OUTPATIENT_CLINIC_OR_DEPARTMENT_OTHER): Payer: BLUE CROSS/BLUE SHIELD | Admitting: Hematology

## 2018-05-19 ENCOUNTER — Other Ambulatory Visit: Payer: Self-pay

## 2018-05-19 ENCOUNTER — Other Ambulatory Visit (HOSPITAL_COMMUNITY): Payer: BLUE CROSS/BLUE SHIELD

## 2018-05-19 VITALS — BP 130/67 | HR 78 | Temp 98.4°F | Resp 20 | Wt 298.5 lb

## 2018-05-19 DIAGNOSIS — C61 Malignant neoplasm of prostate: Secondary | ICD-10-CM

## 2018-05-19 DIAGNOSIS — E119 Type 2 diabetes mellitus without complications: Secondary | ICD-10-CM | POA: Diagnosis not present

## 2018-05-19 DIAGNOSIS — Z87891 Personal history of nicotine dependence: Secondary | ICD-10-CM

## 2018-05-19 DIAGNOSIS — C7951 Secondary malignant neoplasm of bone: Secondary | ICD-10-CM

## 2018-05-19 MED ORDER — ABIRATERONE ACETATE 250 MG PO TABS: 1000.0000 mg | ORAL_TABLET | Freq: Every day | ORAL | 0 refills | Status: DC

## 2018-05-19 MED ORDER — PREDNISONE 5 MG PO TABS: 5.0000 mg | ORAL_TABLET | Freq: Every day | ORAL | 1 refills | Status: DC

## 2018-05-19 MED ORDER — DENOSUMAB 120 MG/1.7ML ~~LOC~~ SOLN
120.0000 mg | Freq: Once | SUBCUTANEOUS | Status: AC
  Administered 2018-05-19: 120 mg via SUBCUTANEOUS
  Filled 2018-05-19: qty 1.7

## 2018-05-19 NOTE — Progress Notes (Signed)
Phillips Climes tolerated Xgeva injection well without complaints or incident. Calcium 9.3 today and pt denied any tooth or jaw pain and no recent or future dental visits.Pt discharged self ambulatory in satisfactory condition

## 2018-05-19 NOTE — Assessment & Plan Note (Signed)
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 Loletha Grayer on 06/27/2014, stage IIIb (PT3PN0), Gleason 4+3 is equal to 7, underwent radiation.  He developed metastatic disease in June 2018.  He was started on Lupron and denosumab.  His last CT scan and bone scan were done in June 2018.  Bone scan was negative for metastatic disease.  However CT scan showed sclerotic lesions. - His last Lupron 22.5 mg was on 03/22/2018.  He is tolerating them very well. -He does have occasional hot flashes.  He takes Megace 1 tablet as needed. - We discussed the blood work today.  His PSA from 05/18/2018 went up to 3.15.  It was previously 2.62 and 1.38. -He denies any recent urinary infections.  No new pains were reported.  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 - We talked about adding Abiraterone and prednisone to the current regimen.  We talked about side effects including but not limited to hypertension, hypokalemia and steroid-induced side effects.  He was newly diagnosed with diabetes recently.  Hence I would start him on prednisone 5 mg once a day.  If he does have any side effects, I will increase it to twice a day. -I will order whole-body bone scan and a CT scan of the abdomen and pelvis to evaluate for metastatic disease in his baseline. -We will see him back after the scans to discuss results.  He will hold onto the pills until he sees [place].  2.  Bone metastasis: -He is tolerating monthly denosumab very well.  His calcium is staying in the normal limits.

## 2018-05-19 NOTE — Patient Instructions (Signed)
[facility] at [facility] Discharge Instructions  Follow up after your scans   Thank you for choosing [facility] at [facility] to provide your oncology and hematology care.  To afford each patient quality time with our provider, please arrive at least 15 minutes before your scheduled appointment time.   If you have a lab appointment with the Cancer Center please come in thru the  Main Entrance and check in at the main information desk  You need to re-schedule your appointment should you arrive 10 or more minutes late.  We strive to give you quality time with our providers, and arriving late affects you and other patients whose appointments are after yours.  Also, if you no show three or more times for appointments you may be dismissed from the clinic at the providers discretion.     Again, thank you for choosing [facility].  Our hope is that these requests will decrease the amount of time that you wait before being seen by our physicians.       _____________________________________________________________  Should you have questions after your visit to [facility], please contact our office at [phone] between the hours of 8:00 a.m. and 4:30 p.m.  Voicemails left after 4:00 p.m. will not be returned until the following business day.  For prescription refill requests, have your pharmacy contact our office and allow 72 hours.    Cancer Center Support Programs:   > Cancer Support Group  2nd Tuesday of the month 1pm-2pm, Journey Room    

## 2018-05-19 NOTE — Patient Instructions (Signed)
[facility] at [facility] Discharge Instructions  Received Xgeva injection today. Follow-up as scheduled. Call clinic for any questions or concerns   Thank you for choosing [facility] at [facility] to provide your oncology and hematology care.  To afford each patient quality time with our provider, please arrive at least 15 minutes before your scheduled appointment time.   If you have a lab appointment with the Cancer Center please come in thru the  Main Entrance and check in at the main information desk  You need to re-schedule your appointment should you arrive 10 or more minutes late.  We strive to give you quality time with our providers, and arriving late affects you and other patients whose appointments are after yours.  Also, if you no show three or more times for appointments you may be dismissed from the clinic at the providers discretion.     Again, thank you for choosing [facility].  Our hope is that these requests will decrease the amount of time that you wait before being seen by our physicians.       _____________________________________________________________  Should you have questions after your visit to [facility], please contact our office at [phone] between the hours of 8:00 a.m. and 4:30 p.m.  Voicemails left after 4:00 p.m. will not be returned until the following business day.  For prescription refill requests, have your pharmacy contact our office and allow 72 hours.    Cancer Center Support Programs:   > Cancer Support Group  2nd Tuesday of the month 1pm-2pm, Journey Room   

## 2018-05-19 NOTE — Progress Notes (Signed)
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 Clyde Lundborg    02/11/2014 Pathology Results    Prostatic adenocarcinoma identified in 5 of 6 prostate specimens with Gleason pattern showing primary pattern-grade 4, secondary pattern grade 3.  Total Gleason score equals 7.  Proportion of prostatic tissue involved by tumor: 70%.    05/03/2014 Imaging    Bone scan- Negative     06/27/2014 Procedure    Radical resection of prostate by Dr. Raynelle Bring    07/29/2014 Pathology Results    1. Prostate, radical resection - PROSTATIC ADENOCARCINOMA, GLEASON SCORE 4 + 3 = 7. - RIGHT AND LEFT PROSTATE INVOLVED. - RIGHT AND LEFT SEMINAL VESICLES INVOLVED BY TUMOR. - EXTRACAPSULAR EXTENSION BY TUMOR. - TUMOR EXTENDS INTO BLADDER NECK TISSUE. - MARGINS NOT INVOLVED. 2. Lymph nodes, regional resection, left pelvic - THREE BENIGN LYMPH NODES (0/3). 3. Lymph nodes, regional resection, right pelvic - FOUR BENIGN LYMPH NODES (0/4).    11/16/2016 Imaging    Bone scan- New areas of increased uptake superolateral to the left orbit (faint), at S1, and in the posterolateral aspect of the left sixth rib.     11/25/2016 Imaging    CT CAP- 1. Status post radical prostatectomy. No findings to suggest local recurrence of disease or definite extraskeletal metastatic disease in the chest, abdomen or pelvis. However, there are several osseous lesions, as above, concerning for metastatic disease to the bones. 2. Hepatic steatosis. 3. Aortic atherosclerosis, in addition to 2 vessel coronary artery disease. Please note that although the presence of  coronary artery calcium documents the presence of coronary artery disease, the severity of this disease and any potential stenosis cannot be assessed on this non-gated CT examination. Assessment for potential risk factor modification, dietary therapy or pharmacologic therapy may be warranted, if clinically indicated. 4. Additional incidental findings, as above.      CANCER STAGING: Cancer Staging Prostate cancer [facility]) Staging form: Prostate, AJCC 7th Edition - Pathologic stage from 07/29/2014: Stage III (T3b, N0, cM0, Gleason 7) - Signed by Baird Cancer, PA-C on 11/09/2016 - Clinical: Stage IV (T3, N0, M1, PSA: Less than 10, Gleason 7) - Signed by [name], MD on 12/21/2016 - Pathologic: No stage assigned - Unsigned    INTERVAL HISTORY:  Mr. Leavy 62 y.o. male returns for routine follow-up for prostate cancer. He is here today and doing very well with his injections.He is having a few hot flashes but nothing unmanageable. He is more emotional than normal with the injections. He has has noticed his urine stream is weaker than normal. He denies any fevers or recent infections. Denies any any bleeding or hematuria. Denies any new pains. Denies any nausea, vomiting, or diarrhea. He reports his appetite at 100% and his energy level is 75%. He has no problem maintaining his weight at this time.     REVIEW OF SYSTEMS:  Review of Systems  Genitourinary:        Weak stream flow  All other systems reviewed and are negative.    PAST MEDICAL/SURGICAL HISTORY:  Past Medical History:  Diagnosis Date  . COPD (chronic  obstructive pulmonary disease) ([place])   . Coronary artery disease    DES to mid LAD August 2014 ([facility])  . Essential hypertension   . Hyperthyroidism   . NSTEMI (non-ST elevated myocardial infarction) ([place]) 2014  . Prostate cancer [facility])    Past Surgical History:  Procedure Laterality Date  . APPENDECTOMY    . LYMPHADENECTOMY Bilateral 06/27/2014   Procedure:  LYMPHADENECTOMY;  Surgeon: Raynelle Bring, MD;  Location: [facility] ORS;  Service: Urology;  Laterality: Bilateral;  . ROBOT ASSISTED LAPAROSCOPIC RADICAL PROSTATECTOMY N/A 06/27/2014   Procedure: ROBOTIC ASSISTED LAPAROSCOPIC RADICAL PROSTATECTOMY LEVEL 3;  Surgeon: Raynelle Bring, MD;  Location: [facility]S;  Service: Urology;  Laterality: N/A;  . TONSILLECTOMY       SOCIAL HISTORY:  Social History   Socioeconomic History  . Marital status: Married    Spouse name: Not on file  . Number of children: Not on file  . Years of education: Not on file  . Highest education level: Not on file  Occupational History  . Not on file  Social Needs  . Financial resource strain: Not on file  . Food insecurity:    Worry: Not on file    Inability: Not on file  . Transportation needs:    Medical: Not on file    Non-medical: Not on file  Tobacco Use  . Smoking status: Former Smoker    Years: 40.00    Types: Cigarettes    Last attempt to quit: 06/21/2012    Years since quitting: 5.9  . Smokeless tobacco: Never Used  Substance and Sexual Activity  . Alcohol use: Yes    Comment: occasional  . Drug use: No  . Sexual activity: Not on file  Lifestyle  . Physical activity:    Days per week: Not on file    Minutes per session: Not on file  . Stress: Not on file  Relationships  . Social connections:    Talks on phone: Not on file    Gets together: Not on file    Attends religious service: Not on file    Active member of club or organization: Not on file    Attends meetings of clubs or organizations: Not on file    Relationship status: Not on file  . Intimate partner violence:    Fear of current or ex partner: Not on file    Emotionally abused: Not on file    Physically abused: Not on file    Forced sexual activity: Not on file  Other Topics Concern  . Not on file  Social History Narrative  . Not on file    FAMILY HISTORY:  Family History  Problem Relation Age of Onset  . Dementia Mother   .  Thyroid disease Mother   . Kidney Stones Father   . Stroke Brother     CURRENT MEDICATIONS:  Outpatient Encounter Medications as of 05/19/2018  Medication Sig  . albuterol (PROVENTIL HFA;VENTOLIN HFA) 108 (90 BASE) MCG/ACT inhaler Inhale 1-2 puffs into the lungs every 6 (six) hours as needed for wheezing or shortness of breath.  [name] Kitchen aspirin EC 81 MG tablet Take 81 mg by mouth daily.  [name] Kitchen atorvastatin (LIPITOR) 40 MG tablet Take 1 tablet (40 mg total) by mouth at bedtime.  . Calcium-Magnesium-Vitamin D (CALCIUM 500 PO) Take 2 tablets by mouth daily.  [name] Kitchen Co-Enzyme Q-10 100 MG CAPS Take 1 capsule by mouth daily.  . fluticasone-salmeterol (ADVAIR HFA) 115-21 MCG/ACT inhaler Inhale 2 puffs into the lungs 2 (  two) times daily.   [name] Kitchen KRILL OIL PO Take 1 capsule by mouth daily.   [name] Kitchen Leuprolide Acetate, 3 Month, (LUPRON DEPOT, 2-MONTH, IM) Inject into the muscle every 3 (three) months.  [name] Kitchen losartan (COZAAR) 25 MG tablet Take 25 mg by mouth daily.  . MEGESTROL ACETATE PO Take 20 mg by mouth daily.   . methimazole (TAPAZOLE) 10 MG tablet Take 10 mg by mouth every other day.   . metoprolol succinate (TOPROL-XL) 50 MG 24 hr tablet Take 50 mg by mouth every morning. Take with or immediately following a meal.  . Multiple Vitamin (MULTIVITAMIN) tablet Take 1 tablet by mouth daily.  [name] Kitchen abiraterone acetate (ZYTIGA) 250 MG tablet Take 4 tablets (1,000 mg total) by mouth daily. Take on an empty stomach 1 hour before or 2 hours after a meal  . predniSONE (DELTASONE) 5 MG tablet Take 1 tablet (5 mg total) by mouth daily with breakfast.   No facility-administered encounter medications on file as of 05/19/2018.     ALLERGIES:  Allergies  Allergen Reactions  . Penicillins Hives     PHYSICAL EXAM:  ECOG Performance status: 1  Vitals:   05/19/18 0903  BP: 130/67  Pulse: 78  Resp: 20  Temp: 98.4 F (36.9 C)  SpO2: 98%   Filed Weights   05/19/18 0903  Weight: 298 lb 8 oz (135.4 kg)    Physical Exam    Constitutional: He is oriented to person, place, and time. He appears well-developed and well-nourished.  Musculoskeletal: Normal range of motion.  Neurological: He is alert and oriented to person, place, and time.  Skin: Skin is warm and dry.  Psychiatric: He has a normal mood and affect. His behavior is normal. Judgment and thought content normal.     LABORATORY DATA:  I have reviewed the labs as listed.  CBC    Component Value Date/Time   WBC 7.7 05/18/2018 1301   RBC 3.83 (L) 05/18/2018 1301   HGB 12.9 (L) 05/18/2018 1301   HCT 37.8 (L) 05/18/2018 1301   PLT 133 (L) 05/18/2018 1301   MCV 98.7 05/18/2018 1301   MCH 33.7 05/18/2018 1301   MCHC 34.1 05/18/2018 1301   RDW 13.1 05/18/2018 1301   LYMPHSABS 2.3 05/18/2018 1301   MONOABS 0.7 05/18/2018 1301   EOSABS 0.2 05/18/2018 1301   BASOSABS 0.0 05/18/2018 1301   CMP Latest Ref Rng & Units 05/18/2018 04/21/2018 03/22/2018  Glucose 70 - 99 mg/dL 171(H) 215(H) 220(H)  BUN 8 - 23 mg/dL 31(H) 32(H) 25(H)  Creatinine 0.61 - 1.24 mg/dL 1.28(H) 1.37(H) 1.14  Sodium 135 - 145 mmol/L 139 137 138  Potassium 3.5 - 5.1 mmol/L 4.5 4.2 5.2(H)  Chloride 98 - 111 mmol/L 114(H) 107 108  CO2 22 - 32 mmol/L 21(L) 23 23  Calcium 8.9 - 10.3 mg/dL 9.3 8.5(L) 8.7(L)  Total Protein 6.5 - 8.1 g/dL 6.6 6.4(L) 6.6  Total Bilirubin 0.3 - 1.2 mg/dL 0.5 0.5 0.8  Alkaline Phos 38 - 126 U/L 91 87 90  AST 15 - 41 U/L 23 26 24   ALT 0 - 44 U/L 24 27 23       I have reviewed Francene Finders, NP's note and agree with the documentation.  I personally performed a face-to-face visit, made revisions and my assessment and plan is as follows.      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 Loletha Grayer on 06/27/2014, stage IIIb (PT3PN0), Gleason 4+3 is equal to 7,  underwent radiation.  He developed metastatic disease in June 2018.  He was started on Lupron and denosumab.  His last CT scan and bone scan were done in June 2018.  Bone scan was negative for metastatic disease.  However CT scan showed sclerotic lesions. - His last Lupron 22.5 mg was on 03/22/2018.  He is tolerating them very well. -He does have occasional hot flashes.  He takes Megace 1 tablet as needed. - We discussed the blood work today.  His PSA from 05/18/2018 went up to 3.15.  It was previously 2.62 and 1.38. -He denies any recent urinary infections.  No new pains were reported.  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 - We talked about adding Abiraterone and prednisone to the current regimen.  We talked about side effects including but not limited to hypertension, hypokalemia and steroid-induced side effects.  He was newly diagnosed with diabetes recently.  Hence I would start him on prednisone 5 mg once a day.  If he does have any side effects, I will increase it to twice a day. -I will order whole-body bone scan and a CT scan of the abdomen and pelvis to evaluate for metastatic disease in his baseline. -We will see him back after the scans to discuss results.  He will hold onto the pills until he sees [place].  2.  Bone metastasis: -He is tolerating monthly denosumab very well.  His calcium is staying in the normal limits.      Orders placed this encounter:  Orders Placed This Encounter  Procedures  . CT Abdomen Pelvis W Contrast  . NM Bone Scan Whole Body      Derek Jack, MD [place] [phone]

## 2018-05-22 ENCOUNTER — Telehealth (HOSPITAL_COMMUNITY): Payer: Self-pay | Admitting: Pharmacy Technician

## 2018-05-22 ENCOUNTER — Telehealth (HOSPITAL_COMMUNITY): Payer: Self-pay | Admitting: Pharmacist

## 2018-05-22 DIAGNOSIS — C61 Malignant neoplasm of prostate: Secondary | ICD-10-CM

## 2018-05-22 NOTE — Telephone Encounter (Signed)
Oral Oncology Patient Advocate Encounter  Received notification from [facility] that prior authorization for Zytiga (Abiraterone) is required.  PA submitted on CoverMyMeds Key [id number]  Status is pending  Oral Oncology Clinic will continue to follow.  [place] Patient [place] Phone [phone] Fax [phone] 05/22/2018 12:04 PM

## 2018-05-22 NOTE — Telephone Encounter (Signed)
Oral Oncology Patient Advocate Encounter  Prior Authorization for Zytiga (Abiraterone) has been approved.    PA# [id number]  Effective dates: 05/22/18 through 05/21/19  Patients co-pay is $0.00.  Insurance limits quantity to 15 day supply for 1-3 months for new therapy.  Oral Oncology Clinic will continue to follow.   [place] Patient [place] Phone [phone] Fax [phone] 05/22/2018 1:27 PM

## 2018-05-22 NOTE — Telephone Encounter (Signed)
Oral Oncology Pharmacist Encounter  Received new prescription for Zytiga (abiraterone) for the treatment of metastatic castration sensitive prostate in conjunction with Lupron and prednisone, planned duration until disease progression or unacceptable drug toxicity.  CMP from 05/18/18 assessed, no relevant lab abnormalities. Prescription dose and frequency assessed.   Current medication list in Epic reviewed, one DDIs with Zytiga identified: - Zytiga: Zytiga may increase the concentration of metoprolol. Patient is on a low dose of metoprolol. Monitor patient for bradycardia. No baseline dose adjustment needed.  Prescription has been e-scribed to the [facility] for benefits analysis and approval.  Oral Oncology Clinic will continue to follow for insurance authorization, copayment issues, initial counseling and start date.  Darl Pikes, PharmD, BCPS, [facility] Hematology/Oncology Clinical Pharmacist [facility] [place] [facility] [phone]  05/22/2018 12:24 PM

## 2018-05-24 NOTE — Telephone Encounter (Signed)
Oral Chemotherapy Pharmacist Encounter  Zytiga will be delivered to Hunter Jones on 05/26/18. Per MD note, he will be seen "back after the scans to discuss results.  He will hold onto the pills until he sees [place]." Hunter Jones stated his understanding of the plan.  Patient Education I spoke with patient for overview of new oral chemotherapy medication: .   Pt is doing well. Counseled patient on administration, dosing, side effects, monitoring, drug-food interactions, safe handling, storage, and disposal. Patient will take Zytiga 4 tablets (1,000 mg total) by mouth daily. Take on an empty stomach 1 hour before or 2 hours after a meal.  He will also take prednisone 1 tablet (5 mg total) by mouth daily with breakfast  Side effects include but not limited to: decreased WBC, fatigue, hot flashes, HTN.   Reviewed with patient importance of keeping a medication schedule and plan for any missed doses.  Hunter Jones voiced understanding and appreciation. All questions answered. Medication handout placed in the mail.  Provided patient with Oral [place] [facility] phone number. Patient knows to call the office with questions or concerns. Oral Chemotherapy Navigation Clinic will continue to follow.  Darl Pikes, PharmD, BCPS, [facility] Hematology/Oncology Clinical Pharmacist [facility] Oral [place] [facility] [phone]  05/24/2018 3:39 PM

## 2018-05-24 NOTE — Telephone Encounter (Signed)
Spoke with patient.  Scheduled medication to be delivered 05/26/18.  Patient will bring medication to appointment in January.

## 2018-05-25 MED FILL — ABIRATERONE ACETATE 250 MG: 250 | 15 days supply | Qty: 60 | Fill #0

## 2018-05-30 NOTE — Progress Notes (Signed)
Cardiology Office Note  Date: 05/31/2018   ID: ZACKARI RUANE, DOB 06/02/1956, MRN [id number]  PCP: Monico Blitz, MD  Primary Cardiologist: Rozann Lesches, MD   Chief Complaint  Patient presents with  . Coronary Artery Disease    History of Present Illness: Hunter Jones is a 62 y.o. male last seen in June.  He is here for a routine visit.  He does not report any angina symptoms or progressive shortness of breath.  No palpitations or syncope.  He is semiretired, drives a truck twice a month.  I reviewed his medications.  He reports compliance and no obvious intolerances.  Cardiac regimen includes aspirin, Lipitor, Cozaar, and Toprol-XL.  I personally reviewed his ECG today which shows normal sinus rhythm.  He continues to follow with Dr. Manuella Ghazi.  Recent LDL was 41.  Past Medical History:  Diagnosis Date  . COPD (chronic obstructive pulmonary disease) ([place])   . Coronary artery disease    DES to mid LAD August 2014 ([facility])  . Essential hypertension   . Hyperthyroidism   . NSTEMI (non-ST elevated myocardial infarction) ([place]) 2014  . Prostate cancer [facility])     Past Surgical History:  Procedure Laterality Date  . APPENDECTOMY    . LYMPHADENECTOMY Bilateral 06/27/2014   Procedure: LYMPHADENECTOMY;  Surgeon: Raynelle Bring, MD;  Location: [facility];  Service: Urology;  Laterality: Bilateral;  . ROBOT ASSISTED LAPAROSCOPIC RADICAL PROSTATECTOMY N/A 06/27/2014   Procedure: ROBOTIC ASSISTED LAPAROSCOPIC RADICAL PROSTATECTOMY LEVEL 3;  Surgeon: Raynelle Bring, MD;  Location[facility];  Service: Urology;  Laterality: N/A;  . TONSILLECTOMY      Current Outpatient Medications  Medication Sig Dispense Refill  . albuterol (PROVENTIL HFA;VENTOLIN HFA) 108 (90 BASE) MCG/ACT inhaler Inhale 1-2 puffs into the lungs every 6 (six) hours as needed for wheezing or shortness of breath.    [name] Kitchen aspirin EC 81 MG tablet Take 81 mg by mouth daily.    [name] Kitchen atorvastatin (LIPITOR) 40 MG tablet Take 1 tablet (40  mg total) by mouth at bedtime. 60 tablet 3  . Calcium-Magnesium-Vitamin D (CALCIUM 500 PO) Take 2 tablets by mouth daily.    [name] Kitchen Co-Enzyme Q-10 100 MG CAPS Take 1 capsule by mouth daily.    . fluticasone-salmeterol (ADVAIR HFA) 115-21 MCG/ACT inhaler Inhale 2 puffs into the lungs 2 (two) times daily.     [name] Kitchen KRILL OIL PO Take 1 capsule by mouth daily.     [name] Kitchen Leuprolide Acetate, 3 Month, (LUPRON DEPOT, 47-MONTH, IM) Inject into the muscle every 3 (three) months.    [name] Kitchen losartan (COZAAR) 25 MG tablet Take 25 mg by mouth daily.    . MEGESTROL ACETATE PO Take 20 mg by mouth daily.     . methimazole (TAPAZOLE) 10 MG tablet Take 10 mg by mouth every other day.     . metoprolol succinate (TOPROL-XL) 50 MG 24 hr tablet Take 50 mg by mouth every morning. Take with or immediately following a meal.    . Multiple Vitamin (MULTIVITAMIN) tablet Take 1 tablet by mouth daily.    [name] Kitchen abiraterone acetate (ZYTIGA) 250 MG tablet Take 4 tablets (1,000 mg total) by mouth daily. Take on an empty stomach 1 hour before or 2 hours after a meal (Patient not taking: Reported on 05/31/2018) 120 tablet 0  . predniSONE (DELTASONE) 5 MG tablet Take 1 tablet (5 mg total) by mouth daily with breakfast. (Patient not taking: Reported on 05/31/2018) 30 tablet 1   No current facility-administered medications  for this visit.    Allergies:  Penicillins   Social History: The patient  reports that he quit smoking about 5 years ago. His smoking use included cigarettes. He quit after 40.00 years of use. He has never used smokeless tobacco. He reports current alcohol use. He reports that he does not use drugs.   ROS:  Please see the history of present illness. Otherwise, complete review of systems is positive for venous stasis, chronic back pain.  All other systems are reviewed and negative.   Physical Exam: VS:  BP 116/73   Pulse 79   Ht 5\' 11"  (1.803 m)   Wt 298 lb 12.8 oz (135.5 kg)   SpO2 97%   BMI 41.67 kg/m , BMI Body mass index is  41.67 kg/m.  Wt Readings from Last 3 Encounters:  05/31/18 298 lb 12.8 oz (135.5 kg)  05/19/18 298 lb 8 oz (135.4 kg)  03/22/18 (!) 306 lb 9.6 oz (139.1 kg)    General: Morbidly obese male, appears comfortable at rest. HEENT: Conjunctiva and lids normal, oropharynx clear. Neck: Supple, no elevated JVP or carotid bruits, no thyromegaly. Lungs: Clear to auscultation, nonlabored breathing at rest. Cardiac: Regular rate and rhythm, no S3 or significant systolic murmur. Abdomen: Obese, nontender, bowel sounds present. Extremities: Venous stasis, distal pulses 2+. Skin: Warm and dry. Musculoskeletal: No kyphosis. Neuropsychiatric: Alert and oriented x3, affect grossly appropriate.  ECG: I personally reviewed the tracing from 05/27/2017 which showed sinus rhythm with borderline low voltage.  Recent Labwork: 05/18/2018: ALT 24; AST 23; BUN 31; Creatinine, Ser 1.28; Hemoglobin 12.9; Platelets 133; Potassium 4.5; Sodium 139  October 2019: Cholesterol 94, triglycerides 123, HDL 28, LDL 41  Other Studies Reviewed Today:  Lexiscan Myoview 06/09/2017:  Normal perfusion. LVEF 52% with apical hypokinesis. Consider echo to confirm LVEF and wall motion.  This is a low risk study.  Nuclear stress EF: 52%.  Assessment and Plan:  1.  CAD status post DES to the mid LAD in 2014.  He reports no progressive angina symptoms and had a low risk Myoview study in December 2018.  Continue with current medications and observation.  2.  Mixed hyperlipidemia, on statin therapy with recent LDL 41.  3.  Lower extremity edema and venous stasis.  Discussed weight loss and exercise.  Could use compression stockings as well.  4.  Essential hypertension, blood pressure is well controlled today.  Current medicines were reviewed with the patient today.   Orders Placed This Encounter  Procedures  . EKG 12-Lead    Disposition: Follow-up in 6 months.  Signed, Satira Sark, MD, [facility] 05/31/2018 9:42 AM     [place] [facility] [place], [place], [place] [address] Phone: [phone]; Fax: [phone]

## 2018-05-31 ENCOUNTER — Ambulatory Visit (INDEPENDENT_AMBULATORY_CARE_PROVIDER_SITE_OTHER): Payer: BLUE CROSS/BLUE SHIELD | Admitting: Cardiology

## 2018-05-31 ENCOUNTER — Encounter: Payer: Self-pay | Admitting: Cardiology

## 2018-05-31 VITALS — BP 116/73 | HR 79 | Ht 71.0 in | Wt 298.8 lb

## 2018-05-31 DIAGNOSIS — E782 Mixed hyperlipidemia: Secondary | ICD-10-CM | POA: Diagnosis not present

## 2018-05-31 DIAGNOSIS — I878 Other specified disorders of veins: Secondary | ICD-10-CM

## 2018-05-31 DIAGNOSIS — I10 Essential (primary) hypertension: Secondary | ICD-10-CM

## 2018-05-31 DIAGNOSIS — I25119 Atherosclerotic heart disease of native coronary artery with unspecified angina pectoris: Secondary | ICD-10-CM

## 2018-05-31 NOTE — Patient Instructions (Signed)

## 2018-06-12 ENCOUNTER — Encounter (HOSPITAL_COMMUNITY)
Admission: RE | Admit: 2018-06-12 | Discharge: 2018-06-12 | Disposition: A | Discharge status: Home / Self Care | Payer: BLUE CROSS/BLUE SHIELD | Source: Ambulatory Visit | Attending: Nurse Practitioner | Admitting: Nurse Practitioner

## 2018-06-12 ENCOUNTER — Encounter (HOSPITAL_COMMUNITY): Payer: Self-pay

## 2018-06-12 ENCOUNTER — Ambulatory Visit (HOSPITAL_COMMUNITY)
Admission: RE | Admit: 2018-06-12 | Discharge: 2018-06-12 | Disposition: A | Discharge status: Home / Self Care | Payer: BLUE CROSS/BLUE SHIELD | Source: Ambulatory Visit | Attending: Nurse Practitioner | Admitting: Nurse Practitioner

## 2018-06-12 DIAGNOSIS — C7951 Secondary malignant neoplasm of bone: Secondary | ICD-10-CM

## 2018-06-12 DIAGNOSIS — C61 Malignant neoplasm of prostate: Secondary | ICD-10-CM

## 2018-06-12 HISTORY — DX: Type 2 diabetes mellitus without complications: E11.9

## 2018-06-12 MED ORDER — IOPAMIDOL (ISOVUE-300) INJECTION 61%
100.0000 mL | Freq: Once | INTRAVENOUS | Status: AC | PRN
  Administered 2018-06-12: 100 mL via INTRAVENOUS

## 2018-06-12 MED ORDER — TECHNETIUM TC 99M MEDRONATE IV KIT
20.0000 | PACK | Freq: Once | INTRAVENOUS | Status: AC | PRN
  Administered 2018-06-12: 19.5 via INTRAVENOUS

## 2018-06-15 ENCOUNTER — Other Ambulatory Visit (HOSPITAL_COMMUNITY): Payer: BLUE CROSS/BLUE SHIELD

## 2018-06-15 ENCOUNTER — Ambulatory Visit (HOSPITAL_COMMUNITY): Payer: BLUE CROSS/BLUE SHIELD

## 2018-06-15 ENCOUNTER — Other Ambulatory Visit (HOSPITAL_COMMUNITY): Payer: Self-pay

## 2018-06-15 DIAGNOSIS — C61 Malignant neoplasm of prostate: Secondary | ICD-10-CM

## 2018-06-16 ENCOUNTER — Encounter (HOSPITAL_COMMUNITY): Payer: Self-pay | Admitting: Hematology

## 2018-06-16 ENCOUNTER — Other Ambulatory Visit: Payer: Self-pay

## 2018-06-16 ENCOUNTER — Inpatient Hospital Stay (HOSPITAL_COMMUNITY): Payer: BLUE CROSS/BLUE SHIELD | Attending: Hematology

## 2018-06-16 ENCOUNTER — Ambulatory Visit (HOSPITAL_COMMUNITY): Payer: BLUE CROSS/BLUE SHIELD

## 2018-06-16 ENCOUNTER — Inpatient Hospital Stay (HOSPITAL_COMMUNITY): Payer: BLUE CROSS/BLUE SHIELD

## 2018-06-16 ENCOUNTER — Inpatient Hospital Stay (HOSPITAL_BASED_OUTPATIENT_CLINIC_OR_DEPARTMENT_OTHER): Payer: BLUE CROSS/BLUE SHIELD | Admitting: Hematology

## 2018-06-16 ENCOUNTER — Other Ambulatory Visit (HOSPITAL_COMMUNITY): Payer: BLUE CROSS/BLUE SHIELD

## 2018-06-16 VITALS — BP 133/69 | HR 87 | Temp 98.5°F | Resp 18 | Ht 71.0 in | Wt 291.4 lb

## 2018-06-16 DIAGNOSIS — E119 Type 2 diabetes mellitus without complications: Secondary | ICD-10-CM | POA: Diagnosis not present

## 2018-06-16 DIAGNOSIS — Z9079 Acquired absence of other genital organ(s): Secondary | ICD-10-CM

## 2018-06-16 DIAGNOSIS — Z5111 Encounter for antineoplastic chemotherapy: Secondary | ICD-10-CM | POA: Insufficient documentation

## 2018-06-16 DIAGNOSIS — I10 Essential (primary) hypertension: Secondary | ICD-10-CM

## 2018-06-16 DIAGNOSIS — C61 Malignant neoplasm of prostate: Secondary | ICD-10-CM

## 2018-06-16 DIAGNOSIS — C7951 Secondary malignant neoplasm of bone: Secondary | ICD-10-CM

## 2018-06-16 LAB — CBC WITH DIFFERENTIAL/PLATELET
Abs Immature Granulocytes: 0.03 10*3/uL (ref 0.00–0.07)
BASOS PCT: 1 %
Basophils Absolute: 0 10*3/uL (ref 0.0–0.1)
EOS ABS: 0.2 10*3/uL (ref 0.0–0.5)
Eosinophils Relative: 3 %
HCT: 41.7 % (ref 39.0–52.0)
Hemoglobin: 14 g/dL (ref 13.0–17.0)
Immature Granulocytes: 0 %
Lymphocytes Relative: 28 %
Lymphs Abs: 2.4 10*3/uL (ref 0.7–4.0)
MCH: 33.3 pg (ref 26.0–34.0)
MCHC: 33.6 g/dL (ref 30.0–36.0)
MCV: 99 fL (ref 80.0–100.0)
Monocytes Absolute: 0.6 10*3/uL (ref 0.1–1.0)
Monocytes Relative: 7 %
Neutro Abs: 5.2 10*3/uL (ref 1.7–7.7)
Neutrophils Relative %: 61 %
PLATELETS: 135 10*3/uL — AB (ref 150–400)
RBC: 4.21 MIL/uL — ABNORMAL LOW (ref 4.22–5.81)
RDW: 12.8 % (ref 11.5–15.5)
WBC: 8.5 10*3/uL (ref 4.0–10.5)
nRBC: 0 % (ref 0.0–0.2)

## 2018-06-16 LAB — COMPREHENSIVE METABOLIC PANEL
ALBUMIN: 3.7 g/dL (ref 3.5–5.0)
ALT: 31 U/L (ref 0–44)
AST: 23 U/L (ref 15–41)
Alkaline Phosphatase: 103 U/L (ref 38–126)
Anion gap: 6 (ref 5–15)
BILIRUBIN TOTAL: 0.6 mg/dL (ref 0.3–1.2)
BUN: 37 mg/dL — ABNORMAL HIGH (ref 8–23)
CO2: 23 mmol/L (ref 22–32)
Calcium: 8.8 mg/dL — ABNORMAL LOW (ref 8.9–10.3)
Chloride: 108 mmol/L (ref 98–111)
Creatinine, Ser: 1.31 mg/dL — ABNORMAL HIGH (ref 0.61–1.24)
GFR calc Af Amer: >60 mL/min (ref >60)
GFR calc non Af Amer: 57 mL/min — ABNORMAL LOW (ref >60)
Glucose, Bld: 192 mg/dL — ABNORMAL HIGH (ref 70–99)
Potassium: 4.9 mmol/L (ref 3.5–5.1)
Sodium: 137 mmol/L (ref 135–145)
TOTAL PROTEIN: 6.5 g/dL (ref 6.5–8.1)

## 2018-06-16 LAB — PSA: Prostatic Specific Antigen: 1.56 ng/mL (ref 0.00–4.00)

## 2018-06-16 MED ORDER — LEUPROLIDE ACETATE (3 MONTH) 22.5 MG IM KIT
22.5000 mg | PACK | Freq: Once | INTRAMUSCULAR | Status: AC
  Administered 2018-06-16: 22.5 mg via INTRAMUSCULAR
  Filled 2018-06-16: qty 22.5

## 2018-06-16 MED ORDER — DENOSUMAB 120 MG/1.7ML ~~LOC~~ SOLN
120.0000 mg | Freq: Once | SUBCUTANEOUS | Status: AC
  Administered 2018-06-16: 120 mg via SUBCUTANEOUS
  Filled 2018-06-16: qty 1.7

## 2018-06-16 NOTE — Assessment & Plan Note (Signed)
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 Loletha Grayer on 06/27/2014, stage IIIb (PT3PN0), Gleason 4+3 is equal to 7, underwent radiation.  He developed metastatic disease in June 2018.  He was started on Lupron and denosumab.  His last CT scan and bone scan were done in June 2018.  Bone scan was negative for metastatic disease.  However CT scan showed sclerotic lesions. - His last Lupron 22.5 mg was on 03/22/2018.  He is tolerating them very well. -He does have occasional hot flashes.  He takes Megace 1 tablet as needed. - We discussed the blood work today.  His PSA from 05/18/2018 went up to 3.15.  It was previously 2.62 and 1.38. -He denies any recent urinary infections.  No new pains were reported.  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 - We reviewed the results of the bone scan dated 06/12/2018 showing worsened appearance of the scan with new focus of abnormal uptake in the right ninth rib and increase in the extent of activity in the sacrum and posterior arc of the right sixth rib. -CT scan of the abdomen and pelvis did not show any visceral metastatic disease. - We have talked about starting him on abiraterone and prednisone.  We discussed the side effects in detail.  He obtained the medication a week ago. -He was told to start taking the pill along with prednisone.  We will see him back in 3 weeks for follow-up.  I plan to repeat his blood work at that time.  2.  Bone metastasis: -He will continue denosumab monthly.  He is tolerating it very well.  He will continue calcium supplements.

## 2018-06-16 NOTE — Patient Instructions (Signed)
[facility] at [facility] Discharge Instructions  Follow up in 3 weeks with labs   Thank you for choosing [facility] at [facility] to provide your oncology and hematology care.  To afford each patient quality time with our provider, please arrive at least 15 minutes before your scheduled appointment time.   If you have a lab appointment with the Cancer Center please come in thru the  Main Entrance and check in at the main information desk  You need to re-schedule your appointment should you arrive 10 or more minutes late.  We strive to give you quality time with our providers, and arriving late affects you and other patients whose appointments are after yours.  Also, if you no show three or more times for appointments you may be dismissed from the clinic at the providers discretion.     Again, thank you for choosing [facility].  Our hope is that these requests will decrease the amount of time that you wait before being seen by our physicians.       _____________________________________________________________  Should you have questions after your visit to [facility], please contact our office at [phone] between the hours of 8:00 a.m. and 4:30 p.m.  Voicemails left after 4:00 p.m. will not be returned until the following business day.  For prescription refill requests, have your pharmacy contact our office and allow 72 hours.    Cancer Center Support Programs:   > Cancer Support Group  2nd Tuesday of the month 1pm-2pm, Journey Room    

## 2018-06-16 NOTE — Progress Notes (Signed)
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 Clyde Lundborg    02/11/2014 Pathology Results    Prostatic adenocarcinoma identified in 5 of 6 prostate specimens with Gleason pattern showing primary pattern-grade 4, secondary pattern grade 3.  Total Gleason score equals 7.  Proportion of prostatic tissue involved by tumor: 70%.    05/03/2014 Imaging    Bone scan- Negative     06/27/2014 Procedure    Radical resection of prostate by Dr. Raynelle Bring    07/29/2014 Pathology Results    1. Prostate, radical resection - PROSTATIC ADENOCARCINOMA, GLEASON SCORE 4 + 3 = 7. - RIGHT AND LEFT PROSTATE INVOLVED. - RIGHT AND LEFT SEMINAL VESICLES INVOLVED BY TUMOR. - EXTRACAPSULAR EXTENSION BY TUMOR. - TUMOR EXTENDS INTO BLADDER NECK TISSUE. - MARGINS NOT INVOLVED. 2. Lymph nodes, regional resection, left pelvic - THREE BENIGN LYMPH NODES (0/3). 3. Lymph nodes, regional resection, right pelvic - FOUR BENIGN LYMPH NODES (0/4).    11/16/2016 Imaging    Bone scan- New areas of increased uptake superolateral to the left orbit (faint), at S1, and in the posterolateral aspect of the left sixth rib.     11/25/2016 Imaging    CT CAP- 1. Status post radical prostatectomy. No findings to suggest local recurrence of disease or definite extraskeletal metastatic disease in the chest, abdomen or pelvis. However, there are several osseous lesions, as above, concerning for metastatic disease to the bones. 2. Hepatic steatosis. 3. Aortic atherosclerosis, in addition to 2 vessel coronary artery disease. Please note that although the  presence of coronary artery calcium documents the presence of coronary artery disease, the severity of this disease and any potential stenosis cannot be assessed on this non-gated CT examination. Assessment for potential risk factor modification, dietary therapy or pharmacologic therapy may be warranted, if clinically indicated. 4. Additional incidental findings, as above.      CANCER STAGING: Cancer Staging Prostate cancer [facility]) Staging form: Prostate, AJCC 7th Edition - Pathologic stage from 07/29/2014: Stage III (T3b, N0, cM0, Gleason 7) - Signed by Baird Cancer, PA-C on 11/09/2016 - Clinical: Stage IV (T3, N0, M1, PSA: Less than 10, Gleason 7) - Signed by [name], MD on 12/21/2016 - Pathologic: No stage assigned - Unsigned    INTERVAL HISTORY:  Hunter Jones 63 y.o. male returns for routine follow-up for metastatic castration sensitive prostate cancer to the bones. He is here today with his wife. He is doing well and has no complaints. He does have occasional hot flashes from the lupron injections but they are stable and manageable. He just received his first shipment of his Zytiga. He will start the pills tomorrow. He is recovering from a sinus infection and he finishes his antibiotics today. Denies any nausea and vomiting. Denies any new pains. Had not noticed any recent bleeding such as epistaxis, hematuria or hematochezia. Denies recent chest pain on exertion, shortness of breath on minimal exertion, pre-syncopal episodes, or palpitations. Denies any numbness or tingling in hands or feet. Denies any recent fevers, infections, or recent hospitalizations. He reports his appetite and energy level at 100%.    REVIEW  OF SYSTEMS:  Review of Systems  Cardiovascular: Positive for leg swelling.  Gastrointestinal: Positive for diarrhea.  Genitourinary: Positive for frequency.   All other systems reviewed and are negative.    PAST MEDICAL/SURGICAL HISTORY:  Past Medical History:    Diagnosis Date  . COPD (chronic obstructive pulmonary disease) ([place])   . Coronary artery disease    DES to mid LAD August 2014 ([facility])  . Diabetes mellitus without complication ([place])   . Essential hypertension   . Hyperthyroidism   . NSTEMI (non-ST elevated myocardial infarction) ([place]) 2014  . Prostate cancer [facility])    Past Surgical History:  Procedure Laterality Date  . APPENDECTOMY    . LYMPHADENECTOMY Bilateral 06/27/2014   Procedure: LYMPHADENECTOMY;  Surgeon: Raynelle Bring, MD;  Location: [facility];  Service: Urology;  Laterality: Bilateral;  . ROBOT ASSISTED LAPAROSCOPIC RADICAL PROSTATECTOMY N/A 06/27/2014   Procedure: ROBOTIC ASSISTED LAPAROSCOPIC RADICAL PROSTATECTOMY LEVEL 3;  Surgeon: Raynelle Bring, MD;  Location: [facility];  Service: Urology;  Laterality: N/A;  . TONSILLECTOMY       SOCIAL HISTORY:  Social History   Socioeconomic History  . Marital status: Married    Spouse name: Not on file  . Number of children: Not on file  . Years of education: Not on file  . Highest education level: Not on file  Occupational History  . Not on file  Social Needs  . Financial resource strain: Not on file  . Food insecurity:    Worry: Not on file    Inability: Not on file  . Transportation needs:    Medical: Not on file    Non-medical: Not on file  Tobacco Use  . Smoking status: Former Smoker    Years: 40.00    Types: Cigarettes    Last attempt to quit: 06/21/2012    Years since quitting: 5.9  . Smokeless tobacco: Never Used  Substance and Sexual Activity  . Alcohol use: Yes    Comment: occasional  . Drug use: No  . Sexual activity: Not on file  Lifestyle  . Physical activity:    Days per week: Not on file    Minutes per session: Not on file  . Stress: Not on file  Relationships  . Social connections:    Talks on phone: Not on file    Gets together: Not on file    Attends religious service: Not on file    Active member of club or organization: Not on file     Attends meetings of clubs or organizations: Not on file    Relationship status: Not on file  . Intimate partner violence:    Fear of current or ex partner: Not on file    Emotionally abused: Not on file    Physically abused: Not on file    Forced sexual activity: Not on file  Other Topics Concern  . Not on file  Social History Narrative  . Not on file    FAMILY HISTORY:  Family History  Problem Relation Age of Onset  . Dementia Mother   . Thyroid disease Mother   . Kidney Stones Father   . Stroke Brother     CURRENT MEDICATIONS:  Outpatient Encounter Medications as of 06/16/2018  Medication Sig  . abiraterone acetate (ZYTIGA) 250 MG tablet Take 4 tablets (1,000 mg total) by mouth daily. Take on an empty stomach 1 hour before or 2 hours after a meal  . albuterol (PROVENTIL HFA;VENTOLIN HFA) 108 (90 BASE) MCG/ACT inhaler Inhale  1-2 puffs into the lungs every 6 (six) hours as needed for wheezing or shortness of breath.  [name] Kitchen aspirin EC 81 MG tablet Take 81 mg by mouth daily.  [name] Kitchen atorvastatin (LIPITOR) 40 MG tablet Take 1 tablet (40 mg total) by mouth at bedtime.  . Calcium-Magnesium-Vitamin D (CALCIUM 500 PO) Take 2 tablets by mouth daily.  [name] Kitchen Co-Enzyme Q-10 100 MG CAPS Take 1 capsule by mouth daily.  . fluticasone-salmeterol (ADVAIR HFA) 115-21 MCG/ACT inhaler Inhale 2 puffs into the lungs 2 (two) times daily.   [name] Kitchen KRILL OIL PO Take 1 capsule by mouth daily.   [name] Kitchen Leuprolide Acetate, 3 Month, (LUPRON DEPOT, 38-MONTH, IM) Inject into the muscle every 3 (three) months.  [name] Kitchen losartan (COZAAR) 25 MG tablet Take 25 mg by mouth daily.  . MEGESTROL ACETATE PO Take 20 mg by mouth daily.   . methimazole (TAPAZOLE) 10 MG tablet Take 10 mg by mouth every other day.   . metoprolol succinate (TOPROL-XL) 50 MG 24 hr tablet Take 50 mg by mouth every morning. Take with or immediately following a meal.  . Multiple Vitamin (MULTIVITAMIN) tablet Take 1 tablet by mouth daily.  . predniSONE (DELTASONE) 5 MG  tablet Take 1 tablet (5 mg total) by mouth daily with breakfast. (Patient not taking: Reported on 06/16/2018)   No facility-administered encounter medications on file as of 06/16/2018.     ALLERGIES:  Allergies  Allergen Reactions  . Penicillins Hives     PHYSICAL EXAM:  ECOG Performance status: 1  Vitals:   06/16/18 1000  BP: 133/69  Pulse: 87  Resp: 18  Temp: 98.5 F (36.9 C)  SpO2: 96%   Filed Weights   06/16/18 1000  Weight: 291 lb 7 oz (132.2 kg)    Physical Exam Constitutional:      Appearance: Normal appearance. He is normal weight.  Musculoskeletal: Normal range of motion.  Skin:    General: Skin is warm and dry.  Neurological:     Mental Status: He is alert and oriented to person, place, and time. Mental status is at baseline.  Psychiatric:        Mood and Affect: Mood normal.        Behavior: Behavior normal.        Thought Content: Thought content normal.        Judgment: Judgment normal.      LABORATORY DATA:  I have reviewed the labs as listed.  CBC    Component Value Date/Time   WBC 8.5 06/16/2018 0941   RBC 4.21 (L) 06/16/2018 0941   HGB 14.0 06/16/2018 0941   HCT 41.7 06/16/2018 0941   PLT 135 (L) 06/16/2018 0941   MCV 99.0 06/16/2018 0941   MCH 33.3 06/16/2018 0941   MCHC 33.6 06/16/2018 0941   RDW 12.8 06/16/2018 0941   LYMPHSABS 2.4 06/16/2018 0941   MONOABS 0.6 06/16/2018 0941   EOSABS 0.2 06/16/2018 0941   BASOSABS 0.0 06/16/2018 0941   CMP Latest Ref Rng & Units 06/16/2018 05/18/2018 04/21/2018  Glucose 70 - 99 mg/dL 192(H) 171(H) 215(H)  BUN 8 - 23 mg/dL 37(H) 31(H) 32(H)  Creatinine 0.61 - 1.24 mg/dL 1.31(H) 1.28(H) 1.37(H)  Sodium 135 - 145 mmol/L 137 139 137  Potassium 3.5 - 5.1 mmol/L 4.9 4.5 4.2  Chloride 98 - 111 mmol/L 108 114(H) 107  CO2 22 - 32 mmol/L 23 21(L) 23  Calcium 8.9 - 10.3 mg/dL 8.8(L) 9.3 8.5(L)  Total Protein 6.5 - 8.1 g/dL 6.5 6.6 6.4(L)  Total Bilirubin 0.3 - 1.2 mg/dL 0.6 0.5 0.5  Alkaline Phos 38 - 126  U/L 103 91 87  AST 15 - 41 U/L 23 23 26   ALT 0 - 44 U/L 31 24 27        DIAGNOSTIC IMAGING:  I have independently reviewed the scans and discussed with the patient.   I have reviewed Francene Finders, NP's note and agree with the documentation.  I personally performed a face-to-face visit, made revisions and my assessment and plan is as follows.    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 Loletha Grayer on 06/27/2014, stage IIIb (PT3PN0), Gleason 4+3 is equal to 7, underwent radiation.  He developed metastatic disease in June 2018.  He was started on Lupron and denosumab.  His last CT scan and bone scan were done in June 2018.  Bone scan was negative for metastatic disease.  However CT scan showed sclerotic lesions. - His last Lupron 22.5 mg was on 03/22/2018.  He is tolerating them very well. -He does have occasional hot flashes.  He takes Megace 1 tablet as needed. - We discussed the blood work today.  His PSA from 05/18/2018 went up to 3.15.  It was previously 2.62 and 1.38. -He denies any recent urinary infections.  No new pains were reported.  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 - We reviewed the results of the bone scan dated 06/12/2018 showing worsened appearance of the scan with new focus of abnormal uptake in the right ninth rib and increase in the extent of activity in the sacrum and posterior arc of the right sixth rib. -CT scan of the abdomen and pelvis did not show any visceral metastatic disease. - We have talked about starting him on abiraterone and prednisone.  We discussed the side effects in detail.  He obtained the medication a week ago. -He was told to start taking the pill along with prednisone.  We will see him back in 3 weeks for follow-up.  I plan to repeat his blood work at  that time.  2.  Bone metastasis: -He will continue denosumab monthly.  He is tolerating it very well.  He will continue calcium supplements.      Orders placed this encounter:  Orders Placed This Encounter  Procedures  . PSA  . CBC with Differential/Platelet  . Comprehensive metabolic panel      Hunter Jack, MD [place] [phone]

## 2018-06-16 NOTE — Progress Notes (Signed)
Hunter Jones presents today for injection per MD orders. XGeva  administered SQ in left Abdomen. Administration without incident. Patient tolerated well.  Hunter Jones presents today for injection per MD orders. Lupron administered IM in right Gluteal. Administration without incident. Patient tolerated well.  No complaints at this time. Discharged from clinic ambulatory. F/U with [facility] as scheduled.

## 2018-06-20 ENCOUNTER — Other Ambulatory Visit (HOSPITAL_COMMUNITY): Payer: BLUE CROSS/BLUE SHIELD

## 2018-06-20 ENCOUNTER — Ambulatory Visit (HOSPITAL_COMMUNITY): Payer: BLUE CROSS/BLUE SHIELD

## 2018-06-20 ENCOUNTER — Ambulatory Visit (HOSPITAL_COMMUNITY): Payer: BLUE CROSS/BLUE SHIELD | Admitting: Hematology

## 2018-07-07 ENCOUNTER — Inpatient Hospital Stay (HOSPITAL_COMMUNITY): Payer: BLUE CROSS/BLUE SHIELD

## 2018-07-07 ENCOUNTER — Other Ambulatory Visit: Payer: Self-pay

## 2018-07-07 ENCOUNTER — Encounter (HOSPITAL_COMMUNITY): Payer: Self-pay | Admitting: Hematology

## 2018-07-07 ENCOUNTER — Inpatient Hospital Stay (HOSPITAL_BASED_OUTPATIENT_CLINIC_OR_DEPARTMENT_OTHER): Payer: BLUE CROSS/BLUE SHIELD | Admitting: Hematology

## 2018-07-07 VITALS — BP 141/73 | HR 86 | Temp 98.0°F | Resp 18 | Wt 297.4 lb

## 2018-07-07 DIAGNOSIS — C7951 Secondary malignant neoplasm of bone: Secondary | ICD-10-CM

## 2018-07-07 DIAGNOSIS — Z9079 Acquired absence of other genital organ(s): Secondary | ICD-10-CM

## 2018-07-07 DIAGNOSIS — C61 Malignant neoplasm of prostate: Secondary | ICD-10-CM

## 2018-07-07 LAB — CBC WITH DIFFERENTIAL/PLATELET
ABS IMMATURE GRANULOCYTES: 0.02 10*3/uL (ref 0.00–0.07)
Basophils Absolute: 0 10*3/uL (ref 0.0–0.1)
Basophils Relative: 0 %
Eosinophils Absolute: 0.3 10*3/uL (ref 0.0–0.5)
Eosinophils Relative: 3 %
HCT: 39.4 % (ref 39.0–52.0)
Hemoglobin: 13.3 g/dL (ref 13.0–17.0)
Immature Granulocytes: 0 %
LYMPHS ABS: 1.7 10*3/uL (ref 0.7–4.0)
Lymphocytes Relative: 24 %
MCH: 33.4 pg (ref 26.0–34.0)
MCHC: 33.8 g/dL (ref 30.0–36.0)
MCV: 99 fL (ref 80.0–100.0)
MONO ABS: 0.6 10*3/uL (ref 0.1–1.0)
Monocytes Relative: 8 %
Neutro Abs: 4.6 10*3/uL (ref 1.7–7.7)
Neutrophils Relative %: 65 %
PLATELETS: 134 10*3/uL — AB (ref 150–400)
RBC: 3.98 MIL/uL — ABNORMAL LOW (ref 4.22–5.81)
RDW: 13 % (ref 11.5–15.5)
WBC: 7.3 10*3/uL (ref 4.0–10.5)
nRBC: 0 % (ref 0.0–0.2)

## 2018-07-07 LAB — COMPREHENSIVE METABOLIC PANEL
ALT: 26 U/L (ref 0–44)
AST: 22 U/L (ref 15–41)
Albumin: 3.4 g/dL — ABNORMAL LOW (ref 3.5–5.0)
Alkaline Phosphatase: 95 U/L (ref 38–126)
Anion gap: 6 (ref 5–15)
BUN: 26 mg/dL — ABNORMAL HIGH (ref 8–23)
CHLORIDE: 108 mmol/L (ref 98–111)
CO2: 27 mmol/L (ref 22–32)
Calcium: 9.3 mg/dL (ref 8.9–10.3)
Creatinine, Ser: 1.14 mg/dL (ref 0.61–1.24)
GFR calc Af Amer: >60 mL/min (ref >60)
GFR calc non Af Amer: >60 mL/min (ref >60)
Glucose, Bld: 161 mg/dL — ABNORMAL HIGH (ref 70–99)
Potassium: 4.7 mmol/L (ref 3.5–5.1)
Sodium: 141 mmol/L (ref 135–145)
Total Bilirubin: 0.8 mg/dL (ref 0.3–1.2)
Total Protein: 6.2 g/dL — ABNORMAL LOW (ref 6.5–8.1)

## 2018-07-07 LAB — PSA: Prostatic Specific Antigen: 0.58 ng/mL (ref 0.00–4.00)

## 2018-07-07 NOTE — Patient Instructions (Signed)
[facility] at [facility] Discharge Instructions     Thank you for choosing [facility] at [facility] to provide your oncology and hematology care.  To afford each patient quality time with our provider, please arrive at least 15 minutes before your scheduled appointment time.   If you have a lab appointment with the Cancer Center please come in thru the  Main Entrance and check in at the main information desk  You need to re-schedule your appointment should you arrive 10 or more minutes late.  We strive to give you quality time with our providers, and arriving late affects you and other patients whose appointments are after yours.  Also, if you no show three or more times for appointments you may be dismissed from the clinic at the providers discretion.     Again, thank you for choosing [facility].  Our hope is that these requests will decrease the amount of time that you wait before being seen by our physicians.       _____________________________________________________________  Should you have questions after your visit to  Cancer Center, please contact our office at [phone] between the hours of 8:00 a.m. and 4:30 p.m.  Voicemails left after 4:00 p.m. will not be returned until the following business day.  For prescription refill requests, have your pharmacy contact our office and allow 72 hours.    Cancer Center Support Programs:   > Cancer Support Group  2nd Tuesday of the month 1pm-2pm, Journey Room    

## 2018-07-07 NOTE — Assessment & Plan Note (Signed)
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 Loletha Grayer on 06/27/2014, stage IIIb (PT3PN0), Gleason 4+3 is equal to 7, underwent radiation.  He developed metastatic disease in June 2018.  He was started on Lupron and denosumab.  His last CT scan and bone scan were done in June 2018.  Bone scan was negative for metastatic disease.  However CT scan showed sclerotic lesions. - His last Lupron 22.5 mg was on 06/16/2018.   -He takes Megace 1 tablet as needed for occasional hot flashes. - We reviewed the results of the bone scan dated 06/12/2018 showing worsened appearance of the scan with new focus of abnormal uptake in the right ninth rib and increase in the extent of activity in the sacrum and posterior arc of the right sixth rib. -CT abdomen pelvis on 06/12/2018 shows worsening of bone mets, but no visceral metastatic disease.  - He was started on abiraterone and prednisone 5 mg daily on 06/16/2017. - He denied any major side effects.  His blood pressure and potassium was within normal limits. -We reviewed his other blood work including LFTs which are within normal limits.  We will monitor his LFTs every 2 weeks for the first 2 months. -We will see him back in 4 weeks for follow-up.  2.  Bone metastasis: -He will continue denosumab monthly which is tolerating well. -She will also continue calcium and vitamin D supplements.

## 2018-07-07 NOTE — Progress Notes (Signed)
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)   02/06/2014 Procedure    Prostate biopsy by Dr. Clyde Jones    02/11/2014 Pathology Results    Prostatic adenocarcinoma identified in 5 of 6 prostate specimens with Gleason pattern showing primary pattern-grade 4, secondary pattern grade 3.  Total Gleason score equals 7.  Proportion of prostatic tissue involved by tumor: 70%.    05/03/2014 Imaging    Bone scan- Negative     06/27/2014 Procedure    Radical resection of prostate by Dr. Raynelle Jones    07/29/2014 Pathology Results    1. Prostate, radical resection - PROSTATIC ADENOCARCINOMA, GLEASON SCORE 4 + 3 = 7. - RIGHT AND LEFT PROSTATE INVOLVED. - RIGHT AND LEFT SEMINAL VESICLES INVOLVED BY TUMOR. - EXTRACAPSULAR EXTENSION BY TUMOR. - TUMOR EXTENDS INTO BLADDER NECK TISSUE. - MARGINS NOT INVOLVED. 2. Lymph nodes, regional resection, left pelvic - THREE BENIGN LYMPH NODES (0/3). 3. Lymph nodes, regional resection, right pelvic - FOUR BENIGN LYMPH NODES (0/4).    11/16/2016 Imaging    Bone scan- New areas of increased uptake superolateral to the left orbit (faint), at S1, and in the posterolateral aspect of the left sixth rib.     11/25/2016 Imaging    CT CAP- 1. Status post radical prostatectomy. No findings to suggest local recurrence of disease or definite extraskeletal metastatic disease in the chest, abdomen or pelvis. However, there are several osseous lesions, as above, concerning for metastatic disease to the bones. 2. Hepatic steatosis. 3. Aortic atherosclerosis, in addition to 2 vessel coronary artery disease. Please note that although the  presence of coronary artery calcium documents the presence of coronary artery disease, the severity of this disease and any potential stenosis cannot be assessed on this non-gated CT examination. Assessment for potential risk factor modification, dietary therapy or pharmacologic therapy may be warranted, if clinically indicated. 4. Additional incidental findings, as above.      Jones STAGING: Jones Staging Prostate Jones [facility]) Staging form: Prostate, AJCC 7th Edition - Pathologic stage from 07/29/2014: Stage III (T3b, N0, cM0, Gleason 7) - Signed by Hunter Cancer, PA-C on 11/09/2016 - Clinical: Stage IV (T3, N0, M1, PSA: Less than 10, Gleason 7) - Signed by Hunter First, MD on 12/21/2016 - Pathologic: No stage assigned - Unsigned    INTERVAL HISTORY:  Hunter Jones 63 y.o. 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 He is here today and tolerating treatment well. He reports some mild muscle aches. Denies any nausea, vomiting, or diarrhea. Denies any new pains. Had not noticed any recent bleeding such as epistaxis, hematuria or hematochezia. Denies recent chest pain on exertion, shortness of breath on minimal exertion, pre-syncopal episodes, or palpitations. Denies any numbness or tingling in hands or feet. Denies any recent fevers, infections, or recent hospitalizations. Patient reports appetite at 100% and energy level at 75%.   REVIEW OF SYSTEMS:  Review of Systems  Cardiovascular: Positive for leg swelling.  Musculoskeletal: Positive for myalgias.  All other systems reviewed and are negative.    PAST MEDICAL/SURGICAL HISTORY:  Past Medical History:  Diagnosis Date  . COPD (chronic obstructive pulmonary disease) (  [place])   . Coronary artery disease    DES to mid LAD August 2014 ([facility])  . Diabetes mellitus without complication ([facility])   . Essential hypertension   . Hyperthyroidism   . NSTEMI (non-ST elevated myocardial  infarction) ([place]) 2014  . Prostate Jones [facility])    Past Surgical History:  Procedure Laterality Date  . APPENDECTOMY    . LYMPHADENECTOMY Bilateral 06/27/2014   Procedure: LYMPHADENECTOMY;  Surgeon: Hunter Bring, MD;  Location: [facility];  Service: Urology;  Laterality: Bilateral;  . ROBOT ASSISTED LAPAROSCOPIC RADICAL PROSTATECTOMY N/A 06/27/2014   Procedure: ROBOTIC ASSISTED LAPAROSCOPIC RADICAL PROSTATECTOMY LEVEL 3;  Surgeon: Hunter Bring, MD;  Location: [facility];  Service: Urology;  Laterality: N/A;  . TONSILLECTOMY       SOCIAL HISTORY:  Social History   Socioeconomic History  . Marital status: Married    Spouse name: Not on file  . Number of children: Not on file  . Years of education: Not on file  . Highest education level: Not on file  Occupational History  . Not on file  Social Needs  . Financial resource strain: Not on file  . Food insecurity:    Worry: Not on file    Inability: Not on file  . Transportation needs:    Medical: Not on file    Non-medical: Not on file  Tobacco Use  . Smoking status: Former Smoker    Years: 40.00    Types: Cigarettes    Last attempt to quit: 06/21/2012    Years since quitting: 6.0  . Smokeless tobacco: Never Used  Substance and Sexual Activity  . Alcohol use: Yes    Comment: occasional  . Drug use: No  . Sexual activity: Not on file  Lifestyle  . Physical activity:    Days per week: Not on file    Minutes per session: Not on file  . Stress: Not on file  Relationships  . Social connections:    Talks on phone: Not on file    Gets together: Not on file    Attends religious service: Not on file    Active member of club or organization: Not on file    Attends meetings of clubs or organizations: Not on file    Relationship status: Not on file  . Intimate partner violence:    Fear of current or ex partner: Not on file    Emotionally abused: Not on file    Physically abused: Not on file    Forced sexual activity: Not on file    Other Topics Concern  . Not on file  Social History Narrative  . Not on file    FAMILY HISTORY:  Family History  Problem Relation Age of Onset  . Dementia Mother   . Thyroid disease Mother   . Kidney Stones Father   . Stroke Brother     CURRENT MEDICATIONS:  Outpatient Encounter Medications as of 07/07/2018  Medication Sig  . abiraterone acetate (ZYTIGA) 250 MG tablet Take 4 tablets (1,000 mg total) by mouth daily. Take on an empty stomach 1 hour before or 2 hours after a meal  . albuterol (PROVENTIL HFA;VENTOLIN HFA) 108 (90 BASE) MCG/ACT inhaler Inhale 1-2 puffs into the lungs every 6 (six) hours as needed for wheezing or shortness of breath.  [name] Kitchen aspirin EC 81 MG tablet Take 81 mg by mouth daily.  [name] Kitchen atorvastatin (LIPITOR) 40 MG tablet Take 1 tablet (40 mg total) by mouth at bedtime.  . Calcium-Magnesium-Vitamin D (  CALCIUM 500 PO) Take 2 tablets by mouth daily.  [name] Kitchen Co-Enzyme Q-10 100 MG CAPS Take 1 capsule by mouth daily.  . fluticasone-salmeterol (ADVAIR HFA) 115-21 MCG/ACT inhaler Inhale 2 puffs into the lungs 2 (two) times daily.   [name] Kitchen KRILL OIL PO Take 1 capsule by mouth daily.   [name] Kitchen Leuprolide Acetate, 3 Month, (LUPRON DEPOT, 64-MONTH, IM) Inject into the muscle every 3 (three) months.  [name] Kitchen losartan (COZAAR) 25 MG tablet Take 25 mg by mouth daily.  . MEGESTROL ACETATE PO Take 20 mg by mouth daily.   . methimazole (TAPAZOLE) 10 MG tablet Take 10 mg by mouth every other day.   . metoprolol succinate (TOPROL-XL) 50 MG 24 hr tablet Take 50 mg by mouth every morning. Take with or immediately following a meal.  . Multiple Vitamin (MULTIVITAMIN) tablet Take 1 tablet by mouth daily.  . predniSONE (DELTASONE) 5 MG tablet Take 1 tablet (5 mg total) by mouth daily with breakfast.   No facility-administered encounter medications on file as of 07/07/2018.     ALLERGIES:  Allergies  Allergen Reactions  . Penicillins Hives     PHYSICAL EXAM:  ECOG Performance status: 1  Vitals:    07/07/18 1000  BP: (!) 141/73  Pulse: 86  Resp: 18  Temp: 98 F (36.7 C)  SpO2: 96%   Filed Weights   07/07/18 1000  Weight: 297 lb 6 oz (134.9 kg)    Physical Exam Constitutional:      Appearance: Normal appearance. He is normal weight.  Musculoskeletal: Normal range of motion.  Skin:    General: Skin is warm and dry.  Neurological:     Mental Status: He is alert and oriented to person, place, and time. Mental status is at baseline.  Psychiatric:        Mood and Affect: Mood normal.        Behavior: Behavior normal.        Thought Content: Thought content normal.        Judgment: Judgment normal.      LABORATORY DATA:  I have reviewed the labs as listed.  CBC    Component Value Date/Time   WBC 7.3 07/07/2018 0941   RBC 3.98 (L) 07/07/2018 0941   HGB 13.3 07/07/2018 0941   HCT 39.4 07/07/2018 0941   PLT 134 (L) 07/07/2018 0941   MCV 99.0 07/07/2018 0941   MCH 33.4 07/07/2018 0941   MCHC 33.8 07/07/2018 0941   RDW 13.0 07/07/2018 0941   LYMPHSABS 1.7 07/07/2018 0941   MONOABS 0.6 07/07/2018 0941   EOSABS 0.3 07/07/2018 0941   BASOSABS 0.0 07/07/2018 0941   CMP Latest Ref Rng & Units 07/07/2018 06/16/2018 05/18/2018  Glucose 70 - 99 mg/dL 161(H) 192(H) 171(H)  BUN 8 - 23 mg/dL 26(H) 37(H) 31(H)  Creatinine 0.61 - 1.24 mg/dL 1.14 1.31(H) 1.28(H)  Sodium 135 - 145 mmol/L 141 137 139  Potassium 3.5 - 5.1 mmol/L 4.7 4.9 4.5  Chloride 98 - 111 mmol/L 108 108 114(H)  CO2 22 - 32 mmol/L 27 23 21(L)  Calcium 8.9 - 10.3 mg/dL 9.3 8.8(L) 9.3  Total Protein 6.5 - 8.1 g/dL 6.2(L) 6.5 6.6  Total Bilirubin 0.3 - 1.2 mg/dL 0.8 0.6 0.5  Alkaline Phos 38 - 126 U/L 95 103 91  AST 15 - 41 U/L 22 23 23   ALT 0 - 44 U/L 26 31 24        DIAGNOSTIC IMAGING:  I have independently reviewed the scans and discussed with  the patient.   I have reviewed Francene Finders, NP's note and agree with the documentation.  I personally performed a face-to-face visit, made revisions and my  assessment and plan is as follows.    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 Loletha Grayer on 06/27/2014, stage IIIb (PT3PN0), Gleason 4+3 is equal to 7, underwent radiation.  He developed metastatic disease in June 2018.  He was started on Lupron and denosumab.  His last CT scan and bone scan were done in June 2018.  Bone scan was negative for metastatic disease.  However CT scan showed sclerotic lesions. - His last Lupron 22.5 mg was on 06/16/2018.   -He takes Megace 1 tablet as needed for occasional hot flashes. - We reviewed the results of the bone scan dated 06/12/2018 showing worsened appearance of the scan with new focus of abnormal uptake in the right ninth rib and increase in the extent of activity in the sacrum and posterior arc of the right sixth rib. -CT abdomen pelvis on 06/12/2018 shows worsening of bone mets, but no visceral metastatic disease.  - He was started on abiraterone and prednisone 5 mg daily on 06/16/2017. - He denied any major side effects.  His blood pressure and potassium was within normal limits. -We reviewed his other blood work including LFTs which are within normal limits.  We will monitor his LFTs every 2 weeks for the Jones 2 months. -We will see him back in 4 weeks for follow-up.  2.  Bone metastasis: -He will continue denosumab monthly which is tolerating well. -She will also continue calcium and vitamin D supplements.      Orders placed this encounter:  Orders Placed This Encounter  Procedures  . Comprehensive metabolic panel  . PSA  . CBC with Differential/Platelet  . Comprehensive metabolic panel      Derek Jack, MD [place] [phone]

## 2018-07-14 ENCOUNTER — Inpatient Hospital Stay (HOSPITAL_COMMUNITY): Payer: BLUE CROSS/BLUE SHIELD

## 2018-07-14 ENCOUNTER — Other Ambulatory Visit: Payer: Self-pay

## 2018-07-14 ENCOUNTER — Encounter (HOSPITAL_COMMUNITY): Payer: Self-pay

## 2018-07-14 VITALS — BP 126/67 | HR 82 | Temp 97.7°F | Resp 18

## 2018-07-14 DIAGNOSIS — C61 Malignant neoplasm of prostate: Secondary | ICD-10-CM

## 2018-07-14 DIAGNOSIS — C7951 Secondary malignant neoplasm of bone: Secondary | ICD-10-CM

## 2018-07-14 LAB — COMPREHENSIVE METABOLIC PANEL
ALT: 25 U/L (ref 0–44)
ANION GAP: 7 (ref 5–15)
AST: 22 U/L (ref 15–41)
Albumin: 3.6 g/dL (ref 3.5–5.0)
Alkaline Phosphatase: 97 U/L (ref 38–126)
BUN: 27 mg/dL — ABNORMAL HIGH (ref 8–23)
CO2: 26 mmol/L (ref 22–32)
Calcium: 8.6 mg/dL — ABNORMAL LOW (ref 8.9–10.3)
Chloride: 108 mmol/L (ref 98–111)
Creatinine, Ser: 1.15 mg/dL (ref 0.61–1.24)
GFR calc Af Amer: >60 mL/min (ref >60)
GFR calc non Af Amer: >60 mL/min (ref >60)
Glucose, Bld: 160 mg/dL — ABNORMAL HIGH (ref 70–99)
Potassium: 4.8 mmol/L (ref 3.5–5.1)
Sodium: 141 mmol/L (ref 135–145)
Total Bilirubin: 0.8 mg/dL (ref 0.3–1.2)
Total Protein: 6.1 g/dL — ABNORMAL LOW (ref 6.5–8.1)

## 2018-07-14 MED ORDER — DENOSUMAB 120 MG/1.7ML ~~LOC~~ SOLN
120.0000 mg | Freq: Once | SUBCUTANEOUS | Status: AC
  Administered 2018-07-14: 120 mg via SUBCUTANEOUS
  Filled 2018-07-14: qty 1.7

## 2018-07-21 ENCOUNTER — Inpatient Hospital Stay (HOSPITAL_COMMUNITY): Payer: BLUE CROSS/BLUE SHIELD | Attending: Hematology

## 2018-07-21 ENCOUNTER — Other Ambulatory Visit (HOSPITAL_COMMUNITY): Payer: BLUE CROSS/BLUE SHIELD

## 2018-07-21 DIAGNOSIS — E119 Type 2 diabetes mellitus without complications: Secondary | ICD-10-CM | POA: Diagnosis not present

## 2018-07-21 DIAGNOSIS — I252 Old myocardial infarction: Secondary | ICD-10-CM | POA: Diagnosis not present

## 2018-07-21 DIAGNOSIS — C61 Malignant neoplasm of prostate: Secondary | ICD-10-CM | POA: Diagnosis present

## 2018-07-21 DIAGNOSIS — Z79899 Other long term (current) drug therapy: Secondary | ICD-10-CM | POA: Insufficient documentation

## 2018-07-21 DIAGNOSIS — E0590 Thyrotoxicosis, unspecified without thyrotoxic crisis or storm: Secondary | ICD-10-CM | POA: Diagnosis not present

## 2018-07-21 DIAGNOSIS — Z7982 Long term (current) use of aspirin: Secondary | ICD-10-CM | POA: Diagnosis not present

## 2018-07-21 DIAGNOSIS — I10 Essential (primary) hypertension: Secondary | ICD-10-CM | POA: Diagnosis not present

## 2018-07-21 DIAGNOSIS — Z87891 Personal history of nicotine dependence: Secondary | ICD-10-CM | POA: Insufficient documentation

## 2018-07-21 DIAGNOSIS — R350 Frequency of micturition: Secondary | ICD-10-CM | POA: Diagnosis not present

## 2018-07-21 DIAGNOSIS — J449 Chronic obstructive pulmonary disease, unspecified: Secondary | ICD-10-CM | POA: Insufficient documentation

## 2018-07-21 DIAGNOSIS — C7951 Secondary malignant neoplasm of bone: Secondary | ICD-10-CM | POA: Diagnosis present

## 2018-07-21 LAB — COMPREHENSIVE METABOLIC PANEL
ALT: 25 U/L (ref 0–44)
AST: 19 U/L (ref 15–41)
Albumin: 3.7 g/dL (ref 3.5–5.0)
Alkaline Phosphatase: 91 U/L (ref 38–126)
Anion gap: 7 (ref 5–15)
BUN: 30 mg/dL — ABNORMAL HIGH (ref 8–23)
CO2: 26 mmol/L (ref 22–32)
Calcium: 9.2 mg/dL (ref 8.9–10.3)
Chloride: 107 mmol/L (ref 98–111)
Creatinine, Ser: 1.11 mg/dL (ref 0.61–1.24)
GFR calc Af Amer: >60 mL/min (ref >60)
GFR calc non Af Amer: >60 mL/min (ref >60)
GLUCOSE: 149 mg/dL — AB (ref 70–99)
Potassium: 4.9 mmol/L (ref 3.5–5.1)
Sodium: 140 mmol/L (ref 135–145)
Total Bilirubin: 0.7 mg/dL (ref 0.3–1.2)
Total Protein: 6.4 g/dL — ABNORMAL LOW (ref 6.5–8.1)

## 2018-07-27 ENCOUNTER — Other Ambulatory Visit (HOSPITAL_COMMUNITY): Payer: Self-pay | Admitting: Hematology

## 2018-07-27 DIAGNOSIS — C61 Malignant neoplasm of prostate: Secondary | ICD-10-CM

## 2018-08-01 ENCOUNTER — Encounter (HOSPITAL_COMMUNITY): Payer: Self-pay | Admitting: *Deleted

## 2018-08-01 ENCOUNTER — Other Ambulatory Visit (HOSPITAL_COMMUNITY): Payer: Self-pay | Admitting: *Deleted

## 2018-08-01 DIAGNOSIS — C61 Malignant neoplasm of prostate: Secondary | ICD-10-CM

## 2018-08-01 DIAGNOSIS — C7951 Secondary malignant neoplasm of bone: Secondary | ICD-10-CM

## 2018-08-01 MED ORDER — ABIRATERONE ACETATE 250 MG PO TABS: 1000.0000 mg | ORAL_TABLET | Freq: Every day | ORAL | 0 refills | Status: DC

## 2018-08-01 MED ORDER — PREDNISONE 5 MG PO TABS: 5.0000 mg | ORAL_TABLET | Freq: Every day | ORAL | 3 refills | Status: DC

## 2018-08-01 MED FILL — ABIRATERONE ACETATE 250 MG: 250 | 15 days supply | Qty: 60 | Fill #0

## 2018-08-01 NOTE — Progress Notes (Signed)
Pharmacy called clinic wanting clarification on patients zytiga. I called patient to clarify and he states that he is taking it once daily but he is only taking his prednisone once every other day.  I explained to patient that he needs to be taking it every day to prevent any side effects from the zytiga.  Patient reports understanding. Refills were sent to his pharmacy on both medications.

## 2018-08-01 NOTE — Telephone Encounter (Signed)
Chart reviewed, Zytiga and prednisone refilled.

## 2018-08-02 ENCOUNTER — Encounter (HOSPITAL_COMMUNITY): Payer: Self-pay | Admitting: *Deleted

## 2018-08-02 NOTE — Progress Notes (Signed)
Per Dr. Delton Coombes, patient is to begin taking 2 Zytiga (500mg  ) daily.  I have called and spoke with his wife this morning and advised her.  She states that he has already taken his dose today but will begin taking 2 pills tomorrow.  She also states that she picked up his prednisone yesterday and he took it this morning as well.    I advised her to have him call clinic should he have any questions or concerns.

## 2018-08-07 ENCOUNTER — Other Ambulatory Visit (HOSPITAL_COMMUNITY): Payer: Self-pay | Admitting: Nurse Practitioner

## 2018-08-07 DIAGNOSIS — C61 Malignant neoplasm of prostate: Secondary | ICD-10-CM

## 2018-08-07 DIAGNOSIS — C7951 Secondary malignant neoplasm of bone: Secondary | ICD-10-CM

## 2018-08-11 ENCOUNTER — Other Ambulatory Visit (HOSPITAL_COMMUNITY): Payer: BLUE CROSS/BLUE SHIELD

## 2018-08-11 ENCOUNTER — Inpatient Hospital Stay (HOSPITAL_BASED_OUTPATIENT_CLINIC_OR_DEPARTMENT_OTHER): Payer: BLUE CROSS/BLUE SHIELD | Admitting: Nurse Practitioner

## 2018-08-11 ENCOUNTER — Inpatient Hospital Stay (HOSPITAL_COMMUNITY): Payer: BLUE CROSS/BLUE SHIELD

## 2018-08-11 ENCOUNTER — Ambulatory Visit (HOSPITAL_COMMUNITY): Payer: BLUE CROSS/BLUE SHIELD

## 2018-08-11 ENCOUNTER — Encounter (HOSPITAL_COMMUNITY): Payer: Self-pay | Admitting: Nurse Practitioner

## 2018-08-11 VITALS — BP 122/54 | HR 76 | Temp 97.8°F | Resp 20 | Wt 297.0 lb

## 2018-08-11 DIAGNOSIS — C61 Malignant neoplasm of prostate: Secondary | ICD-10-CM

## 2018-08-11 DIAGNOSIS — E119 Type 2 diabetes mellitus without complications: Secondary | ICD-10-CM | POA: Diagnosis not present

## 2018-08-11 DIAGNOSIS — C7951 Secondary malignant neoplasm of bone: Secondary | ICD-10-CM

## 2018-08-11 DIAGNOSIS — I10 Essential (primary) hypertension: Secondary | ICD-10-CM | POA: Diagnosis not present

## 2018-08-11 DIAGNOSIS — Z87891 Personal history of nicotine dependence: Secondary | ICD-10-CM

## 2018-08-11 DIAGNOSIS — Z7982 Long term (current) use of aspirin: Secondary | ICD-10-CM

## 2018-08-11 DIAGNOSIS — Z79899 Other long term (current) drug therapy: Secondary | ICD-10-CM

## 2018-08-11 DIAGNOSIS — I252 Old myocardial infarction: Secondary | ICD-10-CM

## 2018-08-11 DIAGNOSIS — R350 Frequency of micturition: Secondary | ICD-10-CM

## 2018-08-11 DIAGNOSIS — J449 Chronic obstructive pulmonary disease, unspecified: Secondary | ICD-10-CM

## 2018-08-11 DIAGNOSIS — E0590 Thyrotoxicosis, unspecified without thyrotoxic crisis or storm: Secondary | ICD-10-CM

## 2018-08-11 LAB — CBC WITH DIFFERENTIAL/PLATELET
Abs Immature Granulocytes: 0.03 10*3/uL (ref 0.00–0.07)
BASOS PCT: 0 %
Basophils Absolute: 0 10*3/uL (ref 0.0–0.1)
Eosinophils Absolute: 0.2 10*3/uL (ref 0.0–0.5)
Eosinophils Relative: 2 %
HCT: 40.2 % (ref 39.0–52.0)
Hemoglobin: 13.5 g/dL (ref 13.0–17.0)
Immature Granulocytes: 0 %
Lymphocytes Relative: 24 %
Lymphs Abs: 1.8 10*3/uL (ref 0.7–4.0)
MCH: 33.7 pg (ref 26.0–34.0)
MCHC: 33.6 g/dL (ref 30.0–36.0)
MCV: 100.2 fL — AB (ref 80.0–100.0)
MONOS PCT: 9 %
Monocytes Absolute: 0.6 10*3/uL (ref 0.1–1.0)
Neutro Abs: 4.8 10*3/uL (ref 1.7–7.7)
Neutrophils Relative %: 65 %
Platelets: 152 10*3/uL (ref 150–400)
RBC: 4.01 MIL/uL — ABNORMAL LOW (ref 4.22–5.81)
RDW: 13.3 % (ref 11.5–15.5)
WBC: 7.6 10*3/uL (ref 4.0–10.5)
nRBC: 0 % (ref 0.0–0.2)

## 2018-08-11 LAB — COMPREHENSIVE METABOLIC PANEL
ALT: 24 U/L (ref 0–44)
AST: 22 U/L (ref 15–41)
Albumin: 3.7 g/dL (ref 3.5–5.0)
Alkaline Phosphatase: 92 U/L (ref 38–126)
Anion gap: 6 (ref 5–15)
BUN: 26 mg/dL — ABNORMAL HIGH (ref 8–23)
CO2: 24 mmol/L (ref 22–32)
CREATININE: 1.16 mg/dL (ref 0.61–1.24)
Calcium: 8.4 mg/dL — ABNORMAL LOW (ref 8.9–10.3)
Chloride: 112 mmol/L — ABNORMAL HIGH (ref 98–111)
GFR calc Af Amer: >60 mL/min (ref >60)
GFR calc non Af Amer: >60 mL/min (ref >60)
Glucose, Bld: 134 mg/dL — ABNORMAL HIGH (ref 70–99)
Potassium: 4.7 mmol/L (ref 3.5–5.1)
Sodium: 142 mmol/L (ref 135–145)
Total Bilirubin: 0.7 mg/dL (ref 0.3–1.2)
Total Protein: 6.3 g/dL — ABNORMAL LOW (ref 6.5–8.1)

## 2018-08-11 LAB — PSA: PROSTATIC SPECIFIC ANTIGEN: 0.27 ng/mL (ref 0.00–4.00)

## 2018-08-11 MED ORDER — DENOSUMAB 120 MG/1.7ML ~~LOC~~ SOLN
120.0000 mg | Freq: Once | SUBCUTANEOUS | Status: AC
  Administered 2018-08-11: 120 mg via SUBCUTANEOUS
  Filled 2018-08-11: qty 1.7

## 2018-08-11 NOTE — Progress Notes (Signed)
Patient taking calcium as directed.  Denied tooth or jaw pain.  Tolerated injection with no complaints voiced.  Band aid applied.  VSS with discharge and left ambulatory with no s/s of distress noted.

## 2018-08-11 NOTE — Patient Instructions (Addendum)
[facility] at [facility] Discharge Instructions  Follow up in 4 weeks with labs    Thank you for choosing  Cancer Center at [facility] to provide your oncology and hematology care.  To afford each patient quality time with our provider, please arrive at least 15 minutes before your scheduled appointment time.   If you have a lab appointment with the Cancer Center please come in thru the  Main Entrance and check in at the main information desk  You need to re-schedule your appointment should you arrive 10 or more minutes late.  We strive to give you quality time with our providers, and arriving late affects you and other patients whose appointments are after yours.  Also, if you no show three or more times for appointments you may be dismissed from the clinic at the providers discretion.     Again, thank you for choosing [facility].  Our hope is that these requests will decrease the amount of time that you wait before being seen by our physicians.       _____________________________________________________________  Should you have questions after your visit to [facility], please contact our office at [phone] between the hours of 8:00 a.m. and 4:30 p.m.  Voicemails left after 4:00 p.m. will not be returned until the following business day.  For prescription refill requests, have your pharmacy contact our office and allow 72 hours.    Cancer Center Support Programs:   > Cancer Support Group  2nd Tuesday of the month 1pm-2pm, Journey Room    

## 2018-08-11 NOTE — Assessment & Plan Note (Signed)
1. 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  Underwent prostatectomy by Dr. Merlene Pulling on 06/27/2014, stage IIIb (PT3PN0), Gleason 4+3 is equal to 7, underwent radiation. - Developed metastatic disease in June 2018.  He was started on Lupron and denosumab.  -CT abdomen pelvis on 06/12/2018 showed worsening of the bone mets, but no visceral metastatic disease. -His bone scan dated 06/12/2018 showed worsening appearance of the scan with new focus of abnormal uptake in the right ninth rib and increase in the extent of the activity in the sacrum and posterior arc of the right sixth rib. - His last Lupron 22.5 mg was on 06/16/2018 - He no longer has hot flashes. - He was started on Zytiga and prednisone 5 mg daily on 06/16/2018. - Only side effects he is experiencing is fatigue.  His blood pressure and potassium are within normal limits. - His LFTs have been checked every 2 weeks for the last 2 months and have been within normal limits. -His PSA has been trending down.  PSA on 06/16/2018 was 1.56.  PSA on 07/07/2018 was 0.58.  PSA on labs today is pending. - We will see him back in 4 weeks for follow-up with labs.  2. Bone metastasis: - We will continue his denosumab monthly which she is tolerating well.  His calcium level was 8.4.  He will receive a dose today. - He is also taking calcium and vitamin D supplements twice daily.

## 2018-08-11 NOTE — Patient Instructions (Signed)
[facility] at [facility]  Discharge Instructions:   _______________________________________________________________  Thank you for choosing [facility] at [facility] to provide your oncology and hematology care.  To afford each patient quality time with our providers, please arrive at least 15 minutes before your scheduled appointment.  You need to re-schedule your appointment if you arrive 10 or more minutes late.  We strive to give you quality time with our providers, and arriving late affects you and other patients whose appointments are after yours.  Also, if you no show three or more times for appointments you may be dismissed from the clinic.  Again, thank you for choosing [facility] at [facility]. Our hope is that these requests will allow you access to exceptional care and in a timely manner. _______________________________________________________________  If you have questions after your visit, please contact our office at [phone] between the hours of 8:30 a.m. and 5:00 p.m. Voicemails left after 4:30 p.m. will not be returned until the following business day. _______________________________________________________________  For prescription refill requests, have your pharmacy contact our office. _______________________________________________________________  Recommendations made by the consultant and any test results will be sent to your referring physician. _______________________________________________________________ 

## 2018-08-11 NOTE — Progress Notes (Signed)
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 Clyde Lundborg    02/11/2014 Pathology Results    Prostatic adenocarcinoma identified in 5 of 6 prostate specimens with Gleason pattern showing primary pattern-grade 4, secondary pattern grade 3.  Total Gleason score equals 7.  Proportion of prostatic tissue involved by tumor: 70%.    05/03/2014 Imaging    Bone scan- Negative     06/27/2014 Procedure    Radical resection of prostate by Dr. Raynelle Bring    07/29/2014 Pathology Results    1. Prostate, radical resection - PROSTATIC ADENOCARCINOMA, GLEASON SCORE 4 + 3 = 7. - RIGHT AND LEFT PROSTATE INVOLVED. - RIGHT AND LEFT SEMINAL VESICLES INVOLVED BY TUMOR. - EXTRACAPSULAR EXTENSION BY TUMOR. - TUMOR EXTENDS INTO BLADDER NECK TISSUE. - MARGINS NOT INVOLVED. 2. Lymph nodes, regional resection, left pelvic - THREE BENIGN LYMPH NODES (0/3). 3. Lymph nodes, regional resection, right pelvic - FOUR BENIGN LYMPH NODES (0/4).    11/16/2016 Imaging    Bone scan- New areas of increased uptake superolateral to the left orbit (faint), at S1, and in the posterolateral aspect of the left sixth rib.     11/25/2016 Imaging    CT CAP- 1. Status post radical prostatectomy. No findings to suggest local recurrence of disease or definite extraskeletal metastatic disease in the chest, abdomen or pelvis. However, there are several osseous lesions, as above, concerning for metastatic disease to the bones. 2. Hepatic steatosis. 3. Aortic atherosclerosis, in addition to 2 vessel coronary artery disease. Please note that although  the presence of coronary artery calcium documents the presence of coronary artery disease, the severity of this disease and any potential stenosis cannot be assessed on this non-gated CT examination. Assessment for potential risk factor modification, dietary therapy or pharmacologic therapy may be warranted, if clinically indicated. 4. Additional incidental findings, as above.      CANCER STAGING: Cancer Staging Prostate cancer [facility]) Staging form: Prostate, AJCC 7th Edition - Pathologic stage from 07/29/2014: Stage III (T3b, N0, cM0, Gleason 7) - Signed by Baird Cancer, PA-C on 11/09/2016 - Clinical: Stage IV (T3, N0, M1, PSA: Less than 10, Gleason 7) - Signed by [name], MD on 12/21/2016 - Pathologic: No stage assigned - Unsigned    INTERVAL HISTORY:  Hunter Jones 63 y.o. 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  He is doing well since his last visit.  He is taking his Zytiga twice a day on an empty stomach as prescribed.  The only side effect he complains about is fatigue.  He still has shortness of breath on exertion from his COPD he will follow-up with his pulmonologist soon.  He still complains about urinary frequency.  This has been going on since he had his prostate removed 3 years ago.  Denies any burning or blood. Denies any nausea, vomiting, or diarrhea. Denies any new pains. Had not noticed any recent bleeding such as epistaxis, hematuria or hematochezia. Denies recent chest pain on exertion, shortness of breath on minimal exertion, pre-syncopal episodes, or palpitations. Denies any numbness or tingling in hands or  feet. Denies any recent fevers, infections, or recent hospitalizations. Patient reports appetite at 100% and energy level at 75%.  He is eating well and maintaining his weight at this time.    REVIEW OF SYSTEMS:  Review of Systems  Constitutional: Positive for fatigue.  Respiratory: Positive for  shortness of breath.   Cardiovascular: Positive for leg swelling.  Genitourinary: Positive for frequency.   Neurological: Positive for dizziness.  Psychiatric/Behavioral: Positive for sleep disturbance.  All other systems reviewed and are negative.    PAST MEDICAL/SURGICAL HISTORY:  Past Medical History:  Diagnosis Date  . COPD (chronic obstructive pulmonary disease) ([facility])   . Coronary artery disease    DES to mid LAD August 2014 ([facility])  . Diabetes mellitus without complication ([facility])   . Essential hypertension   . Hyperthyroidism   . NSTEMI (non-ST elevated myocardial infarction) ([place]) 2014  . Prostate cancer [facility]    Past Surgical History:  Procedure Laterality Date  . APPENDECTOMY    . LYMPHADENECTOMY Bilateral 06/27/2014   Procedure: LYMPHADENECTOMY;  Surgeon: Raynelle Bring, MD;  Location: [facility];  Service: Urology;  Laterality: Bilateral;  . ROBOT ASSISTED LAPAROSCOPIC RADICAL PROSTATECTOMY N/A 06/27/2014   Procedure: ROBOTIC ASSISTED LAPAROSCOPIC RADICAL PROSTATECTOMY LEVEL 3;  Surgeon: Raynelle Bring, MD;  Location: [facility];  Service: Urology;  Laterality: N/A;  . TONSILLECTOMY       SOCIAL HISTORY:  Social History   Socioeconomic History  . Marital status: Married    Spouse name: Not on file  . Number of children: Not on file  . Years of education: Not on file  . Highest education level: Not on file  Occupational History  . Not on file  Social Needs  . Financial resource strain: Not on file  . Food insecurity:    Worry: Not on file    Inability: Not on file  . Transportation needs:    Medical: Not on file    Non-medical: Not on file  Tobacco Use  . Smoking status: Former Smoker    Years: 40.00    Types: Cigarettes    Last attempt to quit: 06/21/2012    Years since quitting: 6.1  . Smokeless tobacco: Never Used  Substance and Sexual Activity  . Alcohol use: Yes    Comment: occasional  . Drug use: No  . Sexual activity: Not on file  Lifestyle  .  Physical activity:    Days per week: Not on file    Minutes per session: Not on file  . Stress: Not on file  Relationships  . Social connections:    Talks on phone: Not on file    Gets together: Not on file    Attends religious service: Not on file    Active member of club or organization: Not on file    Attends meetings of clubs or organizations: Not on file    Relationship status: Not on file  . Intimate partner violence:    Fear of current or ex partner: Not on file    Emotionally abused: Not on file    Physically abused: Not on file    Forced sexual activity: Not on file  Other Topics Concern  . Not on file  Social History Narrative  . Not on file    FAMILY HISTORY:  Family History  Problem Relation Age of Onset  . Dementia Mother   . Thyroid disease Mother   . Kidney Stones Father   . Stroke Brother     CURRENT MEDICATIONS:  Outpatient Encounter Medications as of 08/11/2018  Medication Sig  . abiraterone acetate (ZYTIGA) 250 MG tablet Take 4 tablets (1,000 mg total) by mouth daily. Take on an empty stomach 1 hour before or 2 hours after a meal  . albuterol (PROVENTIL HFA;VENTOLIN HFA) 108 (90 BASE) MCG/ACT inhaler Inhale 1-2 puffs into the lungs every 6 (six) hours as needed for wheezing or shortness of breath.  [name] Kitchen aspirin EC 81 MG tablet Take 81 mg by mouth daily.  [name] Kitchen atorvastatin (LIPITOR) 40 MG tablet Take 1 tablet (40 mg total) by mouth at bedtime.  . Calcium-Magnesium-Vitamin D (CALCIUM 500 PO) Take 2 tablets by mouth daily.  [name] Kitchen Co-Enzyme Q-10 100 MG CAPS Take 1 capsule by mouth daily.  . fluticasone-salmeterol (ADVAIR HFA) 115-21 MCG/ACT inhaler Inhale 2 puffs into the lungs 2 (two) times daily.   [name] Kitchen KRILL OIL PO Take 1 capsule by mouth daily.   [name] Kitchen Leuprolide Acetate, 3 Month, (LUPRON DEPOT, 23-MONTH, IM) Inject into the muscle every 3 (three) months.  [name] Kitchen losartan (COZAAR) 25 MG tablet Take 25 mg by mouth daily.  . MEGESTROL ACETATE PO Take 20 mg by mouth daily.   .  methimazole (TAPAZOLE) 10 MG tablet Take 10 mg by mouth every other day.   . metoprolol succinate (TOPROL-XL) 50 MG 24 hr tablet Take 50 mg by mouth every morning. Take with or immediately following a meal.  . Multiple Vitamin (MULTIVITAMIN) tablet Take 1 tablet by mouth daily.  . predniSONE (DELTASONE) 5 MG tablet TAKE 1 TABLET ONCE DAILY WITH BREAKFAST.  . [DISCONTINUED] abiraterone acetate (ZYTIGA) 250 MG tablet Take 4 tablets (1,000 mg total) by mouth daily. Take on an empty stomach 1 hour before or 2 hours after a meal  . [DISCONTINUED] predniSONE (DELTASONE) 5 MG tablet Take 1 tablet (5 mg total) by mouth daily with breakfast.  . [EXPIRED] denosumab (XGEVA) injection 120 mg    No facility-administered encounter medications on file as of 08/11/2018.     ALLERGIES:  Allergies  Allergen Reactions  . Penicillins Hives     PHYSICAL EXAM:  ECOG Performance status: 1  VITAL SIGNS:BP: 122/54, P:76, R:20, T:97.8, O2:97% WEIGHT: 297.1  Physical Exam Constitutional:      Appearance: Normal appearance. He is normal weight.  Cardiovascular:     Rate and Rhythm: Normal rate and regular rhythm.     Heart sounds: Normal heart sounds.  Pulmonary:     Effort: Pulmonary effort is normal.     Breath sounds: Normal breath sounds.  Abdominal:     General: Bowel sounds are normal.     Palpations: Abdomen is soft.  Musculoskeletal: Normal range of motion.  Skin:    General: Skin is warm and dry.  Neurological:     Mental Status: He is alert and oriented to person, place, and time. Mental status is at baseline.  Psychiatric:        Mood and Affect: Mood normal.        Behavior: Behavior normal.        Thought Content: Thought content normal.        Judgment: Judgment normal.      LABORATORY DATA:  I have reviewed the labs as listed.  CBC    Component Value Date/Time   WBC 7.6 08/11/2018 1235   RBC 4.01 (L) 08/11/2018 1235   HGB 13.5 08/11/2018 1235   HCT 40.2 08/11/2018 1235    PLT 152 08/11/2018 1235   MCV 100.2 (H) 08/11/2018 1235  MCH 33.7 08/11/2018 1235   MCHC 33.6 08/11/2018 1235   RDW 13.3 08/11/2018 1235   LYMPHSABS 1.8 08/11/2018 1235   MONOABS 0.6 08/11/2018 1235   EOSABS 0.2 08/11/2018 1235   BASOSABS 0.0 08/11/2018 1235   CMP Latest Ref Rng & Units 08/11/2018 07/21/2018 07/14/2018  Glucose 70 - 99 mg/dL 134(H) 149(H) 160(H)  BUN 8 - 23 mg/dL 26(H) 30(H) 27(H)  Creatinine 0.61 - 1.24 mg/dL 1.16 1.11 1.15  Sodium 135 - 145 mmol/L 142 140 141  Potassium 3.5 - 5.1 mmol/L 4.7 4.9 4.8  Chloride 98 - 111 mmol/L 112(H) 107 108  CO2 22 - 32 mmol/L 24 26 26   Calcium 8.9 - 10.3 mg/dL 8.4(L) 9.2 8.6(L)  Total Protein 6.5 - 8.1 g/dL 6.3(L) 6.4(L) 6.1(L)  Total Bilirubin 0.3 - 1.2 mg/dL 0.7 0.7 0.8  Alkaline Phos 38 - 126 U/L 92 91 97  AST 15 - 41 U/L 22 19 22   ALT 0 - 44 U/L 24 25 25        DIAGNOSTIC IMAGING:  I have independently reviewed the scans and discussed with the patient.   I personally performed a face-to-face visit, made revisions and my assessment and plan is as follows.    ASSESSMENT & PLAN:   Prostate cancer ([place]) 1. 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  Underwent prostatectomy by Dr. Merlene Pulling on 06/27/2014, stage IIIb (PT3PN0), Gleason 4+3 is equal to 7, underwent radiation. - Developed metastatic disease in June 2018.  He was started on Lupron and denosumab.  -CT abdomen pelvis on 06/12/2018 showed worsening of the bone mets, but no visceral metastatic disease. -His bone scan dated 06/12/2018 showed worsening appearance of the scan with new focus of abnormal uptake in the right ninth rib and increase in the extent of the activity in the sacrum and posterior arc of the right sixth rib. - His last Lupron 22.5 mg was on 06/16/2018 - He no longer has hot flashes. - He was started on Zytiga and prednisone 5 mg daily on  06/16/2018. - Only side effects he is experiencing is fatigue.  His blood pressure and potassium are within normal limits. - His LFTs have been checked every 2 weeks for the last 2 months and have been within normal limits. -His PSA has been trending down.  PSA on 06/16/2018 was 1.56.  PSA on 07/07/2018 was 0.58.  PSA on labs today is pending. - We will see him back in 4 weeks for follow-up with labs.  2. Bone metastasis: - We will continue his denosumab monthly which she is tolerating well.  His calcium level was 8.4.  He will receive a dose today. - He is also taking calcium and vitamin D supplements twice daily.       Orders placed this encounter:  Orders Placed This Encounter  Procedures  . PSA  . CBC with Differential/Platelet  . Comprehensive metabolic panel      Derek Jack, MD [place] [phone]

## 2018-08-29 MED FILL — ABIRATERONE ACETATE 250 MG: 250 | 15 days supply | Qty: 60 | Fill #1

## 2018-08-30 ENCOUNTER — Other Ambulatory Visit (HOSPITAL_COMMUNITY): Payer: Self-pay | Admitting: *Deleted

## 2018-09-08 ENCOUNTER — Ambulatory Visit (HOSPITAL_COMMUNITY): Payer: BLUE CROSS/BLUE SHIELD | Admitting: Nurse Practitioner

## 2018-09-08 ENCOUNTER — Other Ambulatory Visit (HOSPITAL_COMMUNITY): Payer: BLUE CROSS/BLUE SHIELD

## 2018-09-08 ENCOUNTER — Ambulatory Visit (HOSPITAL_COMMUNITY): Payer: BLUE CROSS/BLUE SHIELD

## 2018-09-11 ENCOUNTER — Other Ambulatory Visit: Payer: Self-pay

## 2018-09-12 ENCOUNTER — Inpatient Hospital Stay (HOSPITAL_COMMUNITY): Payer: BLUE CROSS/BLUE SHIELD

## 2018-09-12 ENCOUNTER — Inpatient Hospital Stay (HOSPITAL_BASED_OUTPATIENT_CLINIC_OR_DEPARTMENT_OTHER): Payer: BLUE CROSS/BLUE SHIELD | Admitting: Nurse Practitioner

## 2018-09-12 ENCOUNTER — Inpatient Hospital Stay (HOSPITAL_COMMUNITY): Payer: BLUE CROSS/BLUE SHIELD | Attending: Nurse Practitioner

## 2018-09-12 ENCOUNTER — Other Ambulatory Visit: Payer: Self-pay

## 2018-09-12 DIAGNOSIS — C61 Malignant neoplasm of prostate: Secondary | ICD-10-CM | POA: Diagnosis not present

## 2018-09-12 DIAGNOSIS — R600 Localized edema: Secondary | ICD-10-CM

## 2018-09-12 DIAGNOSIS — C7951 Secondary malignant neoplasm of bone: Secondary | ICD-10-CM | POA: Diagnosis present

## 2018-09-12 DIAGNOSIS — Z5111 Encounter for antineoplastic chemotherapy: Secondary | ICD-10-CM | POA: Insufficient documentation

## 2018-09-12 DIAGNOSIS — Z87891 Personal history of nicotine dependence: Secondary | ICD-10-CM

## 2018-09-12 LAB — CBC WITH DIFFERENTIAL/PLATELET
Abs Immature Granulocytes: 0.04 10*3/uL (ref 0.00–0.07)
Basophils Absolute: 0 10*3/uL (ref 0.0–0.1)
Basophils Relative: 0 %
Eosinophils Absolute: 0.3 10*3/uL (ref 0.0–0.5)
Eosinophils Relative: 4 %
HCT: 40 % (ref 39.0–52.0)
Hemoglobin: 13.8 g/dL (ref 13.0–17.0)
Immature Granulocytes: 1 %
Lymphocytes Relative: 22 %
Lymphs Abs: 1.7 10*3/uL (ref 0.7–4.0)
MCH: 34 pg (ref 26.0–34.0)
MCHC: 34.5 g/dL (ref 30.0–36.0)
MCV: 98.5 fL (ref 80.0–100.0)
Monocytes Absolute: 0.6 10*3/uL (ref 0.1–1.0)
Monocytes Relative: 8 %
Neutro Abs: 4.9 10*3/uL (ref 1.7–7.7)
Neutrophils Relative %: 65 %
Platelets: 124 10*3/uL — ABNORMAL LOW (ref 150–400)
RBC: 4.06 MIL/uL — ABNORMAL LOW (ref 4.22–5.81)
RDW: 12.4 % (ref 11.5–15.5)
WBC: 7.6 10*3/uL (ref 4.0–10.5)
nRBC: 0 % (ref 0.0–0.2)

## 2018-09-12 LAB — COMPREHENSIVE METABOLIC PANEL
ALT: 20 U/L (ref 0–44)
AST: 21 U/L (ref 15–41)
Albumin: 3.5 g/dL (ref 3.5–5.0)
Alkaline Phosphatase: 81 U/L (ref 38–126)
Anion gap: 7 (ref 5–15)
BUN: 31 mg/dL — ABNORMAL HIGH (ref 8–23)
CO2: 23 mmol/L (ref 22–32)
Calcium: 8.3 mg/dL — ABNORMAL LOW (ref 8.9–10.3)
Chloride: 107 mmol/L (ref 98–111)
Creatinine, Ser: 1.18 mg/dL (ref 0.61–1.24)
GFR calc Af Amer: >60 mL/min (ref >60)
GFR calc non Af Amer: >60 mL/min (ref >60)
Glucose, Bld: 219 mg/dL — ABNORMAL HIGH (ref 70–99)
Potassium: 4.2 mmol/L (ref 3.5–5.1)
Sodium: 137 mmol/L (ref 135–145)
Total Bilirubin: 0.6 mg/dL (ref 0.3–1.2)
Total Protein: 6 g/dL — ABNORMAL LOW (ref 6.5–8.1)

## 2018-09-12 LAB — PSA: PROSTATIC SPECIFIC ANTIGEN: 0.17 ng/mL (ref 0.00–4.00)

## 2018-09-12 MED ORDER — DENOSUMAB 120 MG/1.7ML ~~LOC~~ SOLN
SUBCUTANEOUS | Status: AC
  Filled 2018-09-12: qty 1.7

## 2018-09-12 MED ORDER — LEUPROLIDE ACETATE (3 MONTH) 22.5 MG IM KIT
22.5000 mg | PACK | Freq: Once | INTRAMUSCULAR | Status: AC
  Administered 2018-09-12: 22.5 mg via INTRAMUSCULAR

## 2018-09-12 MED ORDER — DENOSUMAB 120 MG/1.7ML ~~LOC~~ SOLN
120.0000 mg | Freq: Once | SUBCUTANEOUS | Status: AC
  Administered 2018-09-12: 120 mg via SUBCUTANEOUS

## 2018-09-12 NOTE — Progress Notes (Signed)
1100 labs reviewed and pt seen by Francene Finders, NP who approved patient for Lupron and Xgeva today.  Pt reports he is taking Ca as instructed. Denies jaw or tooth pain, no recent or future dental work. VSS. Tolerated Xgeva and Lupron without incident or complaint. VSS. Discharged self ambulatory in satisfactory condition.

## 2018-09-12 NOTE — Patient Instructions (Signed)
[place] at [facility]  Discharge Instructions:  Lupron and Delton See received today. _______________________________________________________________  Thank you for choosing [place] at [facility] to provide your oncology and hematology care.  To afford each patient quality time with our providers, please arrive at least 15 minutes before your scheduled appointment.  You need to re-schedule your appointment if you arrive 10 or more minutes late.  We strive to give you quality time with our providers, and arriving late affects you and other patients whose appointments are after yours.  Also, if you no show three or more times for appointments you may be dismissed from the clinic.  Again, thank you for choosing [place] at [place] hope is that these requests will allow you access to exceptional care and in a timely manner. _______________________________________________________________  If you have questions after your visit, please contact our office at [phone] between the hours of 8:30 a.m. and 5:00 p.m. Voicemails left after 4:30 p.m. will not be returned until the following business day. _______________________________________________________________  For prescription refill requests, have your pharmacy contact our office. _______________________________________________________________  Recommendations made by the consultant and any test results will be sent to your referring physician. _______________________________________________________________

## 2018-09-12 NOTE — Assessment & Plan Note (Addendum)
1. 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  Underwent prostatectomy by Dr. Merlene Pulling on 06/27/2014, stage IIIb (PT3PN0), Gleason 4+3 is equal to 7, underwent radiation. - Developed metastatic disease in June 2018.  He was started on Lupron and denosumab.  -CT abdomen pelvis on 06/12/2018 showed worsening of the bone mets, but no visceral metastatic disease. -His bone scan dated 06/12/2018 showed worsening appearance of the scan with new focus of abnormal uptake in the right ninth rib and increase in the extent of the activity in the sacrum and posterior arc of the right sixth rib. - His last Lupron 22.5 mg was on 06/16/2018.  He is due for his injection today and will receive it. - He no longer has hot flashes due to starting the megestrol - He was started on Zytiga and prednisone 5 mg daily on 06/16/2018. - Only side effects he is experiencing is fatigue.  His blood pressure and potassium are within normal limits. - His LFTs have been checked weekly for the first month then every 2 weeks for the last 2 months and have been within normal limits. -His PSA has been trending down.  PSA on 06/16/2018 was 1.56.  PSA on 07/07/2018 was 0.58.  PSA on 08/11/2018 was 0.27. - We will see him back in 4 weeks for follow-up with labs.  2. Bone metastasis: - We will continue his denosumab monthly which he is tolerating well.  His calcium level was 8.3 .  He will receive a dose today. - He is also taking calcium and vitamin D supplements twice daily. - He reports he has a yearly dentist appointment he needs to schedule.  He has no complaints with his teeth or jaw at this time.

## 2018-09-12 NOTE — Progress Notes (Signed)
[place], [place] [address]   CLINIC:  Medical Oncology/Hematology  PCP:  Monico Blitz, [place] [address] [phone]   REASON FOR VISIT: Follow-up for metastatic prostate cancer  CURRENT THERAPY: Zytiga 2 tablets daily and prednisone 5 mg    BRIEF ONCOLOGIC HISTORY:    Prostate cancer ([place])   02/06/2014 Procedure    Prostate biopsy by Dr. Clyde Lundborg    02/11/2014 Pathology Results    Prostatic adenocarcinoma identified in 5 of 6 prostate specimens with Gleason pattern showing primary pattern-grade 4, secondary pattern grade 3.  Total Gleason score equals 7.  Proportion of prostatic tissue involved by tumor: 70%.    05/03/2014 Imaging    Bone scan- Negative     06/27/2014 Procedure    Radical resection of prostate by Dr. Raynelle Bring    07/29/2014 Pathology Results    1. Prostate, radical resection - PROSTATIC ADENOCARCINOMA, GLEASON SCORE 4 + 3 = 7. - RIGHT AND LEFT PROSTATE INVOLVED. - RIGHT AND LEFT SEMINAL VESICLES INVOLVED BY TUMOR. - EXTRACAPSULAR EXTENSION BY TUMOR. - TUMOR EXTENDS INTO BLADDER NECK TISSUE. - MARGINS NOT INVOLVED. 2. Lymph nodes, regional resection, left pelvic - THREE BENIGN LYMPH NODES (0/3). 3. Lymph nodes, regional resection, right pelvic - FOUR BENIGN LYMPH NODES (0/4).    11/16/2016 Imaging    Bone scan- New areas of increased uptake superolateral to the left orbit (faint), at S1, and in the posterolateral aspect of the left sixth rib.     11/25/2016 Imaging    CT CAP- 1. Status post radical prostatectomy. No findings to suggest local recurrence of disease or definite extraskeletal metastatic disease in the chest, abdomen or pelvis. However, there are several osseous lesions, as above, concerning for metastatic disease to the bones. 2. Hepatic steatosis. 3. Aortic atherosclerosis, in addition to 2 vessel coronary artery disease. Please note that although the presence of coronary  artery calcium documents the presence of coronary artery disease, the severity of this disease and any potential stenosis cannot be assessed on this non-gated CT examination. Assessment for potential risk factor modification, dietary therapy or pharmacologic therapy may be warranted, if clinically indicated. 4. Additional incidental findings, as above.      CANCER STAGING: Cancer Staging Prostate cancer [facility]) Staging form: Prostate, AJCC 7th Edition - Pathologic stage from 07/29/2014: Stage III (T3b, N0, cM0, Gleason 7) - Signed by Baird Cancer, PA-C on 11/09/2016 - Clinical: Stage IV (T3, N0, M1, PSA: Less than 10, Gleason 7) - Signed by [name], MD on 12/21/2016 - Pathologic: No stage assigned - Unsigned    INTERVAL HISTORY:  Mr. Beckel 63 y.o. male returns for routine follow-up of metastatic prostate cancer.  He is here today alone.  He is doing well he does report fatigue daily but is not unmanageable.  He has lower leg edema that has been stable for years.  He is taking the pill 1 hour before eating on an empty stomach as prescribed.  He has no other complaints at this time.  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  And is still managing to work. Denies any nausea, vomiting, or diarrhea. Denies any new pains. Had not noticed any recent bleeding such as epistaxis, hematuria or hematochezia. Denies recent chest pain on exertion, shortness of breath on minimal exertion, pre-syncopal episodes, or palpitations. Denies any numbness or tingling in hands or feet. Denies any recent fevers, infections,  or recent hospitalizations. Patient reports appetite at 100% and energy level at 75%.  He is eating well and maintaining his weight at this time.   REVIEW OF SYSTEMS:  Review of Systems  Constitutional: Positive for fatigue.  Cardiovascular: Positive for leg swelling.  All other systems reviewed and are negative.    PAST MEDICAL/SURGICAL HISTORY:  Past Medical  History:  Diagnosis Date  . COPD (chronic obstructive pulmonary disease) ([place])   . Coronary artery disease    DES to mid LAD August 2014 ([facility])  . Diabetes mellitus without complication ([place])   . Essential hypertension   . Hyperthyroidism   . NSTEMI (non-ST elevated myocardial infarction) ([place]) 2014  . Prostate cancer [facility])    Past Surgical History:  Procedure Laterality Date  . APPENDECTOMY    . LYMPHADENECTOMY Bilateral 06/27/2014   Procedure: LYMPHADENECTOMY;  Surgeon: Raynelle Bring, MD;  Location: [facility];  Service: Urology;  Laterality: Bilateral;  . ROBOT ASSISTED LAPAROSCOPIC RADICAL PROSTATECTOMY N/A 06/27/2014   Procedure: ROBOTIC ASSISTED LAPAROSCOPIC RADICAL PROSTATECTOMY LEVEL 3;  Surgeon: Raynelle Bring, MD;  Location: [facility];  Service: Urology;  Laterality: N/A;  . TONSILLECTOMY       SOCIAL HISTORY:  Social History   Socioeconomic History  . Marital status: Married    Spouse name: Not on file  . Number of children: Not on file  . Years of education: Not on file  . Highest education level: Not on file  Occupational History  . Not on file  Social Needs  . Financial resource strain: Not on file  . Food insecurity:    Worry: Not on file    Inability: Not on file  . Transportation needs:    Medical: Not on file    Non-medical: Not on file  Tobacco Use  . Smoking status: Former Smoker    Years: 40.00    Types: Cigarettes    Last attempt to quit: 06/21/2012    Years since quitting: 6.2  . Smokeless tobacco: Never Used  Substance and Sexual Activity  . Alcohol use: Yes    Comment: occasional  . Drug use: No  . Sexual activity: Not on file  Lifestyle  . Physical activity:    Days per week: Not on file    Minutes per session: Not on file  . Stress: Not on file  Relationships  . Social connections:    Talks on phone: Not on file    Gets together: Not on file    Attends religious service: Not on file    Active member of club or organization: Not on file     Attends meetings of clubs or organizations: Not on file    Relationship status: Not on file  . Intimate partner violence:    Fear of current or ex partner: Not on file    Emotionally abused: Not on file    Physically abused: Not on file    Forced sexual activity: Not on file  Other Topics Concern  . Not on file  Social History Narrative  . Not on file    FAMILY HISTORY:  Family History  Problem Relation Age of Onset  . Dementia Mother   . Thyroid disease Mother   . Kidney Stones Father   . Stroke Brother     CURRENT MEDICATIONS:  Outpatient Encounter Medications as of 09/12/2018  Medication Sig  . abiraterone acetate (ZYTIGA) 250 MG tablet Take 4 tablets (1,000 mg total) by mouth daily. Take on an empty stomach  1 hour before or 2 hours after a meal  . albuterol (PROVENTIL HFA;VENTOLIN HFA) 108 (90 BASE) MCG/ACT inhaler Inhale 1-2 puffs into the lungs every 6 (six) hours as needed for wheezing or shortness of breath.  [name] Kitchen aspirin EC 81 MG tablet Take 81 mg by mouth daily.  [name] Kitchen atorvastatin (LIPITOR) 40 MG tablet Take 1 tablet (40 mg total) by mouth at bedtime.  . Calcium-Magnesium-Vitamin D (CALCIUM 500 PO) Take 2 tablets by mouth daily. Per pt he takes 600mg  BID  . Co-Enzyme Q-10 100 MG CAPS Take 1 capsule by mouth daily.  . fluticasone-salmeterol (ADVAIR HFA) 115-21 MCG/ACT inhaler Inhale 2 puffs into the lungs 2 (two) times daily.   [name] Kitchen KRILL OIL PO Take 1 capsule by mouth daily.   [name] Kitchen Leuprolide Acetate, 3 Month, (LUPRON DEPOT, 50-MONTH, IM) Inject into the muscle every 3 (three) months.  [name] Kitchen losartan (COZAAR) 25 MG tablet Take 25 mg by mouth daily.  . MEGESTROL ACETATE PO Take 20 mg by mouth daily.   . methimazole (TAPAZOLE) 10 MG tablet Take 10 mg by mouth every other day.   . metoprolol succinate (TOPROL-XL) 50 MG 24 hr tablet Take 50 mg by mouth every morning. Take with or immediately following a meal.  . Multiple Vitamin (MULTIVITAMIN) tablet Take 1 tablet by mouth daily.  .  predniSONE (DELTASONE) 5 MG tablet TAKE 1 TABLET ONCE DAILY WITH BREAKFAST.   No facility-administered encounter medications on file as of 09/12/2018.     ALLERGIES:  Allergies  Allergen Reactions  . Penicillins Hives     PHYSICAL EXAM:  ECOG Performance status: 1  Vitals:   09/12/18 1013  BP: 132/69  Pulse: 90  Resp: 18  Temp: (!) 96.7 F (35.9 C)  SpO2: 98%   Filed Weights   09/12/18 1013  Weight: (!) 302 lb 14.4 oz (137.4 kg)    Physical Exam Constitutional:      Appearance: Normal appearance. He is normal weight.  Cardiovascular:     Rate and Rhythm: Normal rate and regular rhythm.     Heart sounds: Normal heart sounds.  Pulmonary:     Effort: Pulmonary effort is normal.     Breath sounds: Normal breath sounds.  Abdominal:     General: Bowel sounds are normal.     Palpations: Abdomen is soft.  Musculoskeletal: Normal range of motion.  Skin:    General: Skin is warm and dry.  Neurological:     Mental Status: He is alert and oriented to person, place, and time. Mental status is at baseline.  Psychiatric:        Mood and Affect: Mood normal.        Behavior: Behavior normal.        Thought Content: Thought content normal.        Judgment: Judgment normal.      LABORATORY DATA:  I have reviewed the labs as listed.  CBC    Component Value Date/Time   WBC 7.6 09/12/2018 0928   RBC 4.06 (L) 09/12/2018 0928   HGB 13.8 09/12/2018 0928   HCT 40.0 09/12/2018 0928   PLT 124 (L) 09/12/2018 0928   MCV 98.5 09/12/2018 0928   MCH 34.0 09/12/2018 0928   MCHC 34.5 09/12/2018 0928   RDW 12.4 09/12/2018 0928   LYMPHSABS 1.7 09/12/2018 0928   MONOABS 0.6 09/12/2018 0928   EOSABS 0.3 09/12/2018 0928   BASOSABS 0.0 09/12/2018 0928   CMP Latest Ref Rng & Units 09/12/2018 08/11/2018 07/21/2018  Glucose 70 - 99 mg/dL 219(H) 134(H) 149(H)  BUN 8 - 23 mg/dL 31(H) 26(H) 30(H)  Creatinine 0.61 - 1.24 mg/dL 1.18 1.16 1.11  Sodium 135 - 145 mmol/L 137 142 140  Potassium  3.5 - 5.1 mmol/L 4.2 4.7 4.9  Chloride 98 - 111 mmol/L 107 112(H) 107  CO2 22 - 32 mmol/L 23 24 26   Calcium 8.9 - 10.3 mg/dL 8.3(L) 8.4(L) 9.2  Total Protein 6.5 - 8.1 g/dL 6.0(L) 6.3(L) 6.4(L)  Total Bilirubin 0.3 - 1.2 mg/dL 0.6 0.7 0.7  Alkaline Phos 38 - 126 U/L 81 92 91  AST 15 - 41 U/L 21 22 19   ALT 0 - 44 U/L 20 24 25       I personally performed a face-to-face visit.    ASSESSMENT & PLAN:   Prostate cancer ([place]) 1. 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  Underwent prostatectomy by Dr. Merlene Pulling on 06/27/2014, stage IIIb (PT3PN0), Gleason 4+3 is equal to 7, underwent radiation. - Developed metastatic disease in June 2018.  He was started on Lupron and denosumab.  -CT abdomen pelvis on 06/12/2018 showed worsening of the bone mets, but no visceral metastatic disease. -His bone scan dated 06/12/2018 showed worsening appearance of the scan with new focus of abnormal uptake in the right ninth rib and increase in the extent of the activity in the sacrum and posterior arc of the right sixth rib. - His last Lupron 22.5 mg was on 06/16/2018.  He is due for his injection today and will receive it. - He no longer has hot flashes due to starting the megestrol - He was started on Zytiga and prednisone 5 mg daily on 06/16/2018. - Only side effects he is experiencing is fatigue.  His blood pressure and potassium are within normal limits. - His LFTs have been checked weekly for the first month then every 2 weeks for the last 2 months and have been within normal limits. -His PSA has been trending down.  PSA on 06/16/2018 was 1.56.  PSA on 07/07/2018 was 0.58.  PSA on 08/11/2018 was 0.27. - We will see him back in 4 weeks for follow-up with labs.  2. Bone metastasis: - We will continue his denosumab monthly which he is tolerating well.  His calcium level was 8.3 .  He will receive a dose today.  - He is also taking calcium and vitamin D supplements twice daily. - He reports he has a yearly dentist appointment he needs to schedule.  He has no complaints with his teeth or jaw at this time.       Orders placed this encounter:  Orders Placed This Encounter  Procedures  . CBC with Differential/Platelet  . Comprehensive metabolic panel  . PSA      Francene Finders, FNP-C [facility] [phone]

## 2018-09-12 NOTE — Patient Instructions (Signed)
[facility] at [facility] Discharge Instructions  Follow up in 1 month with labs    Thank you for choosing [facility] at [facility] to provide your oncology and hematology care.  To afford each patient quality time with our provider, please arrive at least 15 minutes before your scheduled appointment time.   If you have a lab appointment with the Cancer Center please come in thru the  Main Entrance and check in at the main information desk  You need to re-schedule your appointment should you arrive 10 or more minutes late.  We strive to give you quality time with our providers, and arriving late affects you and other patients whose appointments are after yours.  Also, if you no show three or more times for appointments you may be dismissed from the clinic at the providers discretion.     Again, thank you for choosing [facility].  Our hope is that these requests will decrease the amount of time that you wait before being seen by our physicians.       _____________________________________________________________  Should you have questions after your visit to [facility], please contact our office at [phone] between the hours of 8:00 a.m. and 4:30 p.m.  Voicemails left after 4:00 p.m. will not be returned until the following business day.  For prescription refill requests, have your pharmacy contact our office and allow 72 hours.    Cancer Center Support Programs:   > Cancer Support Group  2nd Tuesday of the month 1pm-2pm, Journey Room    

## 2018-09-16 ENCOUNTER — Other Ambulatory Visit (HOSPITAL_COMMUNITY): Payer: Self-pay | Admitting: Nurse Practitioner

## 2018-09-16 DIAGNOSIS — C7951 Secondary malignant neoplasm of bone: Secondary | ICD-10-CM

## 2018-09-16 DIAGNOSIS — C61 Malignant neoplasm of prostate: Secondary | ICD-10-CM

## 2018-09-30 ENCOUNTER — Other Ambulatory Visit (HOSPITAL_COMMUNITY): Payer: Self-pay | Admitting: Hematology

## 2018-09-30 DIAGNOSIS — C61 Malignant neoplasm of prostate: Secondary | ICD-10-CM

## 2018-10-02 ENCOUNTER — Other Ambulatory Visit (HOSPITAL_COMMUNITY): Payer: Self-pay | Admitting: Hematology

## 2018-10-02 DIAGNOSIS — C61 Malignant neoplasm of prostate: Secondary | ICD-10-CM

## 2018-10-03 MED FILL — ABIRATERONE ACETATE 250 MG: 250 | 30 days supply | Qty: 120 | Fill #0

## 2018-10-10 ENCOUNTER — Other Ambulatory Visit: Payer: Self-pay

## 2018-10-11 ENCOUNTER — Other Ambulatory Visit (HOSPITAL_COMMUNITY): Payer: Self-pay | Admitting: Nurse Practitioner

## 2018-10-11 ENCOUNTER — Other Ambulatory Visit (HOSPITAL_COMMUNITY): Payer: Self-pay | Admitting: *Deleted

## 2018-10-11 ENCOUNTER — Inpatient Hospital Stay (HOSPITAL_COMMUNITY): Payer: BLUE CROSS/BLUE SHIELD | Attending: Hematology

## 2018-10-11 ENCOUNTER — Other Ambulatory Visit: Payer: Self-pay

## 2018-10-11 ENCOUNTER — Inpatient Hospital Stay (HOSPITAL_COMMUNITY): Payer: BLUE CROSS/BLUE SHIELD

## 2018-10-11 ENCOUNTER — Inpatient Hospital Stay (HOSPITAL_BASED_OUTPATIENT_CLINIC_OR_DEPARTMENT_OTHER): Payer: BLUE CROSS/BLUE SHIELD | Admitting: Nurse Practitioner

## 2018-10-11 VITALS — BP 144/71 | HR 76 | Temp 97.7°F | Resp 19 | Wt 300.6 lb

## 2018-10-11 DIAGNOSIS — C7951 Secondary malignant neoplasm of bone: Secondary | ICD-10-CM | POA: Insufficient documentation

## 2018-10-11 DIAGNOSIS — C61 Malignant neoplasm of prostate: Secondary | ICD-10-CM | POA: Insufficient documentation

## 2018-10-11 DIAGNOSIS — Z9079 Acquired absence of other genital organ(s): Secondary | ICD-10-CM

## 2018-10-11 DIAGNOSIS — Z87891 Personal history of nicotine dependence: Secondary | ICD-10-CM

## 2018-10-11 DIAGNOSIS — Z79899 Other long term (current) drug therapy: Secondary | ICD-10-CM

## 2018-10-11 LAB — CBC WITH DIFFERENTIAL/PLATELET
Abs Immature Granulocytes: 0.02 10*3/uL (ref 0.00–0.07)
Basophils Absolute: 0 10*3/uL (ref 0.0–0.1)
Basophils Relative: 1 %
Eosinophils Absolute: 0.3 10*3/uL (ref 0.0–0.5)
Eosinophils Relative: 5 %
HCT: 40.3 % (ref 39.0–52.0)
Hemoglobin: 14.1 g/dL (ref 13.0–17.0)
Immature Granulocytes: 0 %
Lymphocytes Relative: 29 %
Lymphs Abs: 1.8 10*3/uL (ref 0.7–4.0)
MCH: 33.9 pg (ref 26.0–34.0)
MCHC: 35 g/dL (ref 30.0–36.0)
MCV: 96.9 fL (ref 80.0–100.0)
Monocytes Absolute: 0.5 10*3/uL (ref 0.1–1.0)
Monocytes Relative: 9 %
Neutro Abs: 3.4 10*3/uL (ref 1.7–7.7)
Neutrophils Relative %: 56 %
Platelets: 124 10*3/uL — ABNORMAL LOW (ref 150–400)
RBC: 4.16 MIL/uL — ABNORMAL LOW (ref 4.22–5.81)
RDW: 12.7 % (ref 11.5–15.5)
WBC: 6.1 10*3/uL (ref 4.0–10.5)
nRBC: 0 % (ref 0.0–0.2)

## 2018-10-11 LAB — COMPREHENSIVE METABOLIC PANEL
ALT: 23 U/L (ref 0–44)
AST: 21 U/L (ref 15–41)
Albumin: 3.5 g/dL (ref 3.5–5.0)
Alkaline Phosphatase: 79 U/L (ref 38–126)
Anion gap: 11 (ref 5–15)
BUN: 26 mg/dL — ABNORMAL HIGH (ref 8–23)
CO2: 25 mmol/L (ref 22–32)
Calcium: 8.9 mg/dL (ref 8.9–10.3)
Chloride: 106 mmol/L (ref 98–111)
Creatinine, Ser: 1.07 mg/dL (ref 0.61–1.24)
GFR calc Af Amer: >60 mL/min (ref >60)
GFR calc non Af Amer: >60 mL/min (ref >60)
Glucose, Bld: 172 mg/dL — ABNORMAL HIGH (ref 70–99)
Potassium: 4.4 mmol/L (ref 3.5–5.1)
Sodium: 142 mmol/L (ref 135–145)
Total Bilirubin: 0.9 mg/dL (ref 0.3–1.2)
Total Protein: 6.2 g/dL — ABNORMAL LOW (ref 6.5–8.1)

## 2018-10-11 LAB — PSA: Prostatic Specific Antigen: 0.14 ng/mL (ref 0.00–4.00)

## 2018-10-11 MED ORDER — ABIRATERONE ACETATE 250 MG PO TABS: 750.0000 mg | ORAL_TABLET | Freq: Every day | ORAL | 0 refills | Status: DC

## 2018-10-11 MED ORDER — DENOSUMAB 120 MG/1.7ML ~~LOC~~ SOLN
120.0000 mg | Freq: Once | SUBCUTANEOUS | Status: AC
  Administered 2018-10-11: 10:00:00 120 mg via SUBCUTANEOUS
  Filled 2018-10-11: qty 1.7

## 2018-10-11 NOTE — Progress Notes (Signed)
Patient was called and told to increase to 3 pills of zytiga daily. We will check him back in the office in 1 month.

## 2018-10-11 NOTE — Patient Instructions (Addendum)
[facility] at [facility] Discharge Instructions  Follow up in 4 weeks with labs    Thank you for choosing [facility] at [facility] to provide your oncology and hematology care.  To afford each patient quality time with our provider, please arrive at least 15 minutes before your scheduled appointment time.   If you have a lab appointment with the Cancer Center please come in thru the  Main Entrance and check in at the main information desk  You need to re-schedule your appointment should you arrive 10 or more minutes late.  We strive to give you quality time with our providers, and arriving late affects you and other patients whose appointments are after yours.  Also, if you no show three or more times for appointments you may be dismissed from the clinic at the providers discretion.     Again, thank you for choosing [facility].  Our hope is that these requests will decrease the amount of time that you wait before being seen by our physicians.       _____________________________________________________________  Should you have questions after your visit to [facility], please contact our office at [phone] between the hours of 8:00 a.m. and 4:30 p.m.  Voicemails left after 4:00 p.m. will not be returned until the following business day.  For prescription refill requests, have your pharmacy contact our office and allow 72 hours.    Cancer Center Support Programs:   > Cancer Support Group  2nd Tuesday of the month 1pm-2pm, Journey Room    

## 2018-10-11 NOTE — Progress Notes (Signed)
Taking calcium as directed.  Denied tooth, jaw, or leg pain.  Patient tolerated injection with no complaints voiced.  Site clean and dry with no bruising or swelling noted at site.  Band aid applied.  Vss with discharge and left ambulatory with no s/s of distress noted.

## 2018-10-11 NOTE — Telephone Encounter (Signed)
Per provider, patient to start taking 3 pills of zytiga daily.  New prescription sent to pharmacy. patient is aware.

## 2018-10-11 NOTE — Progress Notes (Signed)
Hunter Jones,  [id number]   CLINIC:  Medical Oncology/Hematology  PCP:  Monico Blitz, [place] [address] [phone]   REASON FOR VISIT: Follow-up for prostate cancer  CURRENT THERAPY: Zytiga and prednisone  BRIEF ONCOLOGIC HISTORY:    Prostate cancer ([place])   02/06/2014 Procedure    Prostate biopsy by Dr. Clyde Lundborg    02/11/2014 Pathology Results    Prostatic adenocarcinoma identified in 5 of 6 prostate specimens with Gleason pattern showing primary pattern-grade 4, secondary pattern grade 3.  Total Gleason score equals 7.  Proportion of prostatic tissue involved by tumor: 70%.    05/03/2014 Imaging    Bone scan- Negative     06/27/2014 Procedure    Radical resection of prostate by Dr. Raynelle Bring    07/29/2014 Pathology Results    1. Prostate, radical resection - PROSTATIC ADENOCARCINOMA, GLEASON SCORE 4 + 3 = 7. - RIGHT AND LEFT PROSTATE INVOLVED. - RIGHT AND LEFT SEMINAL VESICLES INVOLVED BY TUMOR. - EXTRACAPSULAR EXTENSION BY TUMOR. - TUMOR EXTENDS INTO BLADDER NECK TISSUE. - MARGINS NOT INVOLVED. 2. Lymph nodes, regional resection, left pelvic - THREE BENIGN LYMPH NODES (0/3). 3. Lymph nodes, regional resection, right pelvic - FOUR BENIGN LYMPH NODES (0/4).    11/16/2016 Imaging    Bone scan- New areas of increased uptake superolateral to the left orbit (faint), at S1, and in the posterolateral aspect of the left sixth rib.     11/25/2016 Imaging    CT CAP- 1. Status post radical prostatectomy. No findings to suggest local recurrence of disease or definite extraskeletal metastatic disease in the chest, abdomen or pelvis. However, there are several osseous lesions, as above, concerning for metastatic disease to the bones. 2. Hepatic steatosis. 3. Aortic atherosclerosis, in addition to 2 vessel coronary artery disease. Please note that although the presence of coronary artery calcium documents the  presence of coronary artery disease, the severity of this disease and any potential stenosis cannot be assessed on this non-gated CT examination. Assessment for potential risk factor modification, dietary therapy or pharmacologic therapy may be warranted, if clinically indicated. 4. Additional incidental findings, as above.      CANCER STAGING: Cancer Staging Prostate cancer [facility]) Staging form: Prostate, AJCC 7th Edition - Pathologic stage from 07/29/2014: Stage III (T3b, N0, cM0, Gleason 7) - Signed by Baird Cancer, PA-C on 11/09/2016 - Clinical: Stage IV (T3, N0, M1, PSA: Less than 10, Gleason 7) - Signed by [name], MD on 12/21/2016 - Pathologic: No stage assigned - Unsigned    INTERVAL HISTORY:  Hunter Jones 63 y.o. male returns for routine follow-up for prostate cancer.  He has no complaints at this time.  He does report lower leg edema but it is stable and unchanged. Denies any nausea, vomiting, or diarrhea. Denies any new pains. Had not noticed any recent bleeding such as epistaxis, hematuria or hematochezia. Denies recent chest pain on exertion, shortness of breath on minimal exertion, pre-syncopal episodes, or palpitations. Denies any numbness or tingling in hands or feet. Denies any recent fevers, infections, or recent hospitalizations. Patient reports appetite at 100 % and energy level at 50 %.  He is eating well and maintaining his weight at this time.    REVIEW OF SYSTEMS:  Review of Systems  Cardiovascular: Positive for leg swelling.  Musculoskeletal: Positive for arthralgias.  All other systems reviewed and are negative.    PAST MEDICAL/SURGICAL HISTORY:  Past Medical History:  Diagnosis Date  . COPD (chronic obstructive pulmonary disease) ([place])   . Coronary artery disease    DES to mid LAD August 2014 ([facility])  . Diabetes mellitus without complication ([place])   . Essential hypertension   . Hyperthyroidism   . NSTEMI (non-ST elevated myocardial infarction)  ([place]) 2014  . Prostate cancer [facility])    Past Surgical History:  Procedure Laterality Date  . APPENDECTOMY    . LYMPHADENECTOMY Bilateral 06/27/2014   Procedure: LYMPHADENECTOMY;  Surgeon: Raynelle Bring, MD;  Location: [facility];  Service: Urology;  Laterality: Bilateral;  . ROBOT ASSISTED LAPAROSCOPIC RADICAL PROSTATECTOMY N/A 06/27/2014   Procedure: ROBOTIC ASSISTED LAPAROSCOPIC RADICAL PROSTATECTOMY LEVEL 3;  Surgeon: Raynelle Bring, MD;  Location: [facility];  Service: Urology;  Laterality: N/A;  . TONSILLECTOMY       SOCIAL HISTORY:  Social History   Socioeconomic History  . Marital status: Married    Spouse name: Not on file  . Number of children: Not on file  . Years of education: Not on file  . Highest education level: Not on file  Occupational History  . Not on file  Social Needs  . Financial resource strain: Not on file  . Food insecurity:    Worry: Not on file    Inability: Not on file  . Transportation needs:    Medical: Not on file    Non-medical: Not on file  Tobacco Use  . Smoking status: Former Smoker    Years: 40.00    Types: Cigarettes    Last attempt to quit: 06/21/2012    Years since quitting: 6.3  . Smokeless tobacco: Never Used  Substance and Sexual Activity  . Alcohol use: Yes    Comment: occasional  . Drug use: No  . Sexual activity: Not on file  Lifestyle  . Physical activity:    Days per week: Not on file    Minutes per session: Not on file  . Stress: Not on file  Relationships  . Social connections:    Talks on phone: Not on file    Gets together: Not on file    Attends religious service: Not on file    Active member of club or organization: Not on file    Attends meetings of clubs or organizations: Not on file    Relationship status: Not on file  . Intimate partner violence:    Fear of current or ex partner: Not on file    Emotionally abused: Not on file    Physically abused: Not on file    Forced sexual activity: Not on file  Other Topics  Concern  . Not on file  Social History Narrative  . Not on file    FAMILY HISTORY:  Family History  Problem Relation Age of Onset  . Dementia Mother   . Thyroid disease Mother   . Kidney Stones Father   . Stroke Brother     CURRENT MEDICATIONS:  Outpatient Encounter Medications as of 10/11/2018  Medication Sig  . abiraterone acetate (ZYTIGA) 250 MG tablet TAKE 4 TABLETS (1,000 MG TOTAL) BY MOUTH DAILY. TAKE ON AN EMPTY STOMACH 1 HOUR BEFORE OR 2 HOURS AFTER A MEAL  . albuterol (PROVENTIL HFA;VENTOLIN HFA) 108 (90 BASE) MCG/ACT inhaler Inhale 1-2 puffs into the lungs every 6 (six) hours as needed for wheezing or shortness of breath.  [name] Kitchen aspirin EC 81 MG tablet Take 81 mg by mouth daily.  [name] Kitchen atorvastatin (LIPITOR) 40 MG tablet Take 1 tablet (40 mg total)  by mouth at bedtime.  . Calcium-Magnesium-Vitamin D (CALCIUM 500 PO) Take 2 tablets by mouth daily. Per pt he takes 600mg  BID  . Co-Enzyme Q-10 100 MG CAPS Take 1 capsule by mouth daily.  . fluticasone-salmeterol (ADVAIR HFA) 115-21 MCG/ACT inhaler Inhale 2 puffs into the lungs 2 (two) times daily.   [name] Kitchen KRILL OIL PO Take 1 capsule by mouth daily.   [name] Kitchen Leuprolide Acetate, 3 Month, (LUPRON DEPOT, 52-MONTH, IM) Inject into the muscle every 3 (three) months.  [name] Kitchen losartan (COZAAR) 25 MG tablet Take 25 mg by mouth daily.  . MEGESTROL ACETATE PO Take 20 mg by mouth daily.   . methimazole (TAPAZOLE) 10 MG tablet Take 10 mg by mouth every other day.   . metoprolol succinate (TOPROL-XL) 50 MG 24 hr tablet Take 50 mg by mouth every morning. Take with or immediately following a meal.  . Multiple Vitamin (MULTIVITAMIN) tablet Take 1 tablet by mouth daily.  . predniSONE (DELTASONE) 5 MG tablet TAKE 1 TABLET ONCE DAILY WITH BREAKFAST.   Facility-Administered Encounter Medications as of 10/11/2018  Medication  . denosumab (XGEVA) injection 120 mg    ALLERGIES:  Allergies  Allergen Reactions  . Penicillins Hives     PHYSICAL EXAM:  ECOG  Performance status: 1  Vitals:   10/11/18 0930  BP: (!) 144/71  Pulse: 76  Resp: 19  Temp: 97.7 F (36.5 C)  SpO2: 95%   Filed Weights   10/11/18 0930  Weight: (!) 300 lb 9.6 oz (136.4 kg)    Physical Exam Constitutional:      Appearance: Normal appearance. He is normal weight.  Cardiovascular:     Rate and Rhythm: Normal rate and regular rhythm.     Heart sounds: Normal heart sounds.  Pulmonary:     Effort: Pulmonary effort is normal.     Breath sounds: Normal breath sounds.  Abdominal:     General: Bowel sounds are normal.     Palpations: Abdomen is soft.  Musculoskeletal: Normal range of motion.  Skin:    General: Skin is warm and dry.  Neurological:     Mental Status: He is alert and oriented to person, place, and time. Mental status is at baseline.  Psychiatric:        Mood and Affect: Mood normal.        Behavior: Behavior normal.        Thought Content: Thought content normal.        Judgment: Judgment normal.      LABORATORY DATA:  I have reviewed the labs as listed.  CBC    Component Value Date/Time   WBC 6.1 10/11/2018 0830   RBC 4.16 (L) 10/11/2018 0830   HGB 14.1 10/11/2018 0830   HCT 40.3 10/11/2018 0830   PLT 124 (L) 10/11/2018 0830   MCV 96.9 10/11/2018 0830   MCH 33.9 10/11/2018 0830   MCHC 35.0 10/11/2018 0830   RDW 12.7 10/11/2018 0830   LYMPHSABS 1.8 10/11/2018 0830   MONOABS 0.5 10/11/2018 0830   EOSABS 0.3 10/11/2018 0830   BASOSABS 0.0 10/11/2018 0830   CMP Latest Ref Rng & Units 10/11/2018 09/12/2018 08/11/2018  Glucose 70 - 99 mg/dL 172(H) 219(H) 134(H)  BUN 8 - 23 mg/dL 26(H) 31(H) 26(H)  Creatinine 0.61 - 1.24 mg/dL 1.07 1.18 1.16  Sodium 135 - 145 mmol/L 142 137 142  Potassium 3.5 - 5.1 mmol/L 4.4 4.2 4.7  Chloride 98 - 111 mmol/L 106 107 112(H)  CO2 22 - 32 mmol/L  25 23 24   Calcium 8.9 - 10.3 mg/dL 8.9 8.3(L) 8.4(L)  Total Protein 6.5 - 8.1 g/dL 6.2(L) 6.0(L) 6.3(L)  Total Bilirubin 0.3 - 1.2 mg/dL 0.9 0.6 0.7  Alkaline  Phos 38 - 126 U/L 79 81 92  AST 15 - 41 U/L 21 21 22   ALT 0 - 44 U/L 23 20 24    I personally performed a face-to-face visit.  All questions were answered to patient's stated satisfaction. Encouraged patient to call with any new concerns or questions before his next visit to the cancer center and we can certain see him sooner, if needed.     ASSESSMENT & PLAN:   Prostate cancer ([place]) 1. 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  Underwent prostatectomy by Dr. Merlene Pulling on 06/27/2014, stage IIIb (PT3PN0), Gleason 4+3 is equal to 7, underwent radiation. - Developed metastatic disease in June 2018.  He was started on Lupron and denosumab.  -CT abdomen pelvis on 06/12/2018 showed worsening of the bone mets, but no visceral metastatic disease. -His bone scan dated 06/12/2018 showed worsening appearance of the scan with new focus of abnormal uptake in the right ninth rib and increase in the extent of the activity in the sacrum and posterior arc of the right sixth rib. - His last Lupron 22.5 mg was on 09/12/2018.  He is due again in 12/12/2018. - He no longer has hot flashes due to starting the megestrol - He was started on Zytiga and prednisone 5 mg daily on 06/16/2018. - Only side effects he is experiencing is fatigue.  His potassium is within normal limits. -His blood pressure has creeped up by 10 points every month.  Today it is 144/71.  We will monitor it again in 1 month. - His LFTs have been checked weekly for the first month then every 2 weeks for the last 2 months and have been within normal limits. -His PSA has been trending down.  PSA on 06/16/2018 was 1.56.  PSA on 07/07/2018 was 0.58.  PSA on 08/11/2018 was 0.27.  PSA on 09/12/2018 was 0.17 - We will see him back in 4 weeks for follow-up with labs.  2. Bone metastasis: - We will continue his denosumab monthly which he is tolerating well.   His calcium level was 8.9 .  He will receive a dose today. - He is also taking calcium and vitamin D supplements twice daily. - He reports he has a yearly dentist appointment he needs to schedule.  He has no complaints with his teeth or jaw at this time.       Orders placed this encounter:  Orders Placed This Encounter  Procedures  . PSA  . Magnesium  . CBC with Differential/Platelet  . Comprehensive metabolic panel  . Vitamin B12  . [place] FNP-C [place] [phone]

## 2018-10-11 NOTE — Assessment & Plan Note (Addendum)
1. 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  Underwent prostatectomy by Dr. Merlene Pulling on 06/27/2014, stage IIIb (PT3PN0), Gleason 4+3 is equal to 7, underwent radiation. - Developed metastatic disease in June 2018.  He was started on Lupron and denosumab.  -CT abdomen pelvis on 06/12/2018 showed worsening of the bone mets, but no visceral metastatic disease. -His bone scan dated 06/12/2018 showed worsening appearance of the scan with new focus of abnormal uptake in the right ninth rib and increase in the extent of the activity in the sacrum and posterior arc of the right sixth rib. - His last Lupron 22.5 mg was on 09/12/2018.  He is due again in 12/12/2018. - He no longer has hot flashes due to starting the megestrol - He was started on Zytiga and prednisone 5 mg daily on 06/16/2018. - Only side effects he is experiencing is fatigue.  His potassium is within normal limits. -His blood pressure has creeped up by 10 points every month.  Today it is 144/71.  We will monitor it again in 1 month. - His LFTs have been checked weekly for the first month then every 2 weeks for the last 2 months and have been within normal limits. -His PSA has been trending down.  PSA on 06/16/2018 was 1.56.  PSA on 07/07/2018 was 0.58.  PSA on 08/11/2018 was 0.27.  PSA on 09/12/2018 was 0.17 - We will see him back in 4 weeks for follow-up with labs.  2. Bone metastasis: - We will continue his denosumab monthly which he is tolerating well.  His calcium level was 8.9 .  He will receive a dose today. - He is also taking calcium and vitamin D supplements twice daily. - He reports he has a yearly dentist appointment he needs to schedule.  He has no complaints with his teeth or jaw at this time.

## 2018-10-30 MED FILL — ABIRATERONE ACETATE 250 MG: 250 | 30 days supply | Qty: 90 | Fill #0

## 2018-11-07 ENCOUNTER — Other Ambulatory Visit: Payer: Self-pay | Admitting: Cardiology

## 2018-11-08 ENCOUNTER — Other Ambulatory Visit: Payer: Self-pay

## 2018-11-08 ENCOUNTER — Inpatient Hospital Stay (HOSPITAL_BASED_OUTPATIENT_CLINIC_OR_DEPARTMENT_OTHER): Payer: BLUE CROSS/BLUE SHIELD | Admitting: Nurse Practitioner

## 2018-11-08 ENCOUNTER — Inpatient Hospital Stay (HOSPITAL_COMMUNITY): Payer: BLUE CROSS/BLUE SHIELD

## 2018-11-08 ENCOUNTER — Inpatient Hospital Stay (HOSPITAL_COMMUNITY): Payer: BLUE CROSS/BLUE SHIELD | Attending: Hematology

## 2018-11-08 DIAGNOSIS — C7951 Secondary malignant neoplasm of bone: Secondary | ICD-10-CM | POA: Diagnosis not present

## 2018-11-08 DIAGNOSIS — E119 Type 2 diabetes mellitus without complications: Secondary | ICD-10-CM | POA: Diagnosis not present

## 2018-11-08 DIAGNOSIS — Z79899 Other long term (current) drug therapy: Secondary | ICD-10-CM | POA: Insufficient documentation

## 2018-11-08 DIAGNOSIS — I10 Essential (primary) hypertension: Secondary | ICD-10-CM | POA: Insufficient documentation

## 2018-11-08 DIAGNOSIS — C61 Malignant neoplasm of prostate: Secondary | ICD-10-CM

## 2018-11-08 DIAGNOSIS — J449 Chronic obstructive pulmonary disease, unspecified: Secondary | ICD-10-CM | POA: Insufficient documentation

## 2018-11-08 DIAGNOSIS — Z87891 Personal history of nicotine dependence: Secondary | ICD-10-CM

## 2018-11-08 DIAGNOSIS — E0590 Thyrotoxicosis, unspecified without thyrotoxic crisis or storm: Secondary | ICD-10-CM | POA: Diagnosis not present

## 2018-11-08 DIAGNOSIS — I2510 Atherosclerotic heart disease of native coronary artery without angina pectoris: Secondary | ICD-10-CM | POA: Insufficient documentation

## 2018-11-08 LAB — COMPREHENSIVE METABOLIC PANEL
ALT: 22 U/L (ref 0–44)
AST: 22 U/L (ref 15–41)
Albumin: 3.6 g/dL (ref 3.5–5.0)
Alkaline Phosphatase: 75 U/L (ref 38–126)
Anion gap: 11 (ref 5–15)
BUN: 31 mg/dL — ABNORMAL HIGH (ref 8–23)
CO2: 24 mmol/L (ref 22–32)
Calcium: 8.9 mg/dL (ref 8.9–10.3)
Chloride: 107 mmol/L (ref 98–111)
Creatinine, Ser: 1.35 mg/dL — ABNORMAL HIGH (ref 0.61–1.24)
GFR calc Af Amer: >60 mL/min (ref >60)
GFR calc non Af Amer: 55 mL/min — ABNORMAL LOW (ref >60)
Glucose, Bld: 167 mg/dL — ABNORMAL HIGH (ref 70–99)
Potassium: 4.7 mmol/L (ref 3.5–5.1)
Sodium: 142 mmol/L (ref 135–145)
Total Bilirubin: 0.9 mg/dL (ref 0.3–1.2)
Total Protein: 6.2 g/dL — ABNORMAL LOW (ref 6.5–8.1)

## 2018-11-08 LAB — CBC WITH DIFFERENTIAL/PLATELET
Abs Immature Granulocytes: 0.03 10*3/uL (ref 0.00–0.07)
Basophils Absolute: 0 10*3/uL (ref 0.0–0.1)
Basophils Relative: 1 %
Eosinophils Absolute: 0.2 10*3/uL (ref 0.0–0.5)
Eosinophils Relative: 4 %
HCT: 40.1 % (ref 39.0–52.0)
Hemoglobin: 13.8 g/dL (ref 13.0–17.0)
Immature Granulocytes: 1 %
Lymphocytes Relative: 29 %
Lymphs Abs: 1.7 10*3/uL (ref 0.7–4.0)
MCH: 34.3 pg — ABNORMAL HIGH (ref 26.0–34.0)
MCHC: 34.4 g/dL (ref 30.0–36.0)
MCV: 99.8 fL (ref 80.0–100.0)
Monocytes Absolute: 0.5 10*3/uL (ref 0.1–1.0)
Monocytes Relative: 8 %
Neutro Abs: 3.4 10*3/uL (ref 1.7–7.7)
Neutrophils Relative %: 57 %
Platelets: 132 10*3/uL — ABNORMAL LOW (ref 150–400)
RBC: 4.02 MIL/uL — ABNORMAL LOW (ref 4.22–5.81)
RDW: 13.4 % (ref 11.5–15.5)
WBC: 5.9 10*3/uL (ref 4.0–10.5)
nRBC: 0 % (ref 0.0–0.2)

## 2018-11-08 LAB — MAGNESIUM: Magnesium: 1.9 mg/dL (ref 1.7–2.4)

## 2018-11-08 LAB — VITAMIN B12: Vitamin B-12: 499 pg/mL (ref 180–914)

## 2018-11-08 LAB — PSA: Prostatic Specific Antigen: 0.15 ng/mL (ref 0.00–4.00)

## 2018-11-08 MED ORDER — DENOSUMAB 120 MG/1.7ML ~~LOC~~ SOLN
120.0000 mg | Freq: Once | SUBCUTANEOUS | Status: AC
  Administered 2018-11-08: 120 mg via SUBCUTANEOUS
  Filled 2018-11-08: qty 1.7

## 2018-11-08 NOTE — Progress Notes (Signed)
[name], Hunter Jones   CLINIC:  Medical Oncology/Hematology  PCP:  Hunter Jones, [place] [address] [phone]   REASON FOR VISIT: Follow-up for prostate cancer  CURRENT THERAPY: Zytiga and prednisone  BRIEF ONCOLOGIC HISTORY:    Prostate cancer ([place])   02/06/2014 Procedure    Prostate biopsy by Dr. Clyde Lundborg    02/11/2014 Pathology Results    Prostatic adenocarcinoma identified in 5 of 6 prostate specimens with Gleason pattern showing primary pattern-grade 4, secondary pattern grade 3.  Total Gleason score equals 7.  Proportion of prostatic tissue involved by tumor: 70%.    05/03/2014 Imaging    Bone scan- Negative     06/27/2014 Procedure    Radical resection of prostate by Dr. Raynelle Bring    07/29/2014 Pathology Results    1. Prostate, radical resection - PROSTATIC ADENOCARCINOMA, GLEASON SCORE 4 + 3 = 7. - RIGHT AND LEFT PROSTATE INVOLVED. - RIGHT AND LEFT SEMINAL VESICLES INVOLVED BY TUMOR. - EXTRACAPSULAR EXTENSION BY TUMOR. - TUMOR EXTENDS INTO BLADDER NECK TISSUE. - MARGINS NOT INVOLVED. 2. Lymph nodes, regional resection, left pelvic - THREE BENIGN LYMPH NODES (0/3). 3. Lymph nodes, regional resection, right pelvic - FOUR BENIGN LYMPH NODES (0/4).    11/16/2016 Imaging    Bone scan- New areas of increased uptake superolateral to the left orbit (faint), at S1, and in the posterolateral aspect of the left sixth rib.     11/25/2016 Imaging    CT CAP- 1. Status post radical prostatectomy. No findings to suggest local recurrence of disease or definite extraskeletal metastatic disease in the chest, abdomen or pelvis. However, there are several osseous lesions, as above, concerning for metastatic disease to the bones. 2. Hepatic steatosis. 3. Aortic atherosclerosis, in addition to 2 vessel coronary artery disease. Please note that although the presence of coronary artery calcium documents the  presence of coronary artery disease, the severity of this disease and any potential stenosis cannot be assessed on this non-gated CT examination. Assessment for potential risk factor modification, dietary therapy or pharmacologic therapy may be warranted, if clinically indicated. 4. Additional incidental findings, as above.      CANCER STAGING: Cancer Staging Prostate cancer [facility]) Staging form: Prostate, AJCC 7th Edition - Pathologic stage from 07/29/2014: Stage III (T3b, N0, cM0, Gleason 7) - Signed by Baird Cancer, PA-C on 11/09/2016 - Clinical: Stage IV (T3, N0, M1, PSA: Less than 10, Gleason 7) - Signed by [name], MD on 12/21/2016 - Pathologic: No stage assigned - Unsigned    INTERVAL HISTORY:  Hunter Jones 63 y.o. male returns for routine follow-up for prostate cancer.  He has been doing well since his last visit.  He reports he is a little fatigued in the mornings when he wakes up.  He reports he is taking his medication in the middle of the night.  He is taking them as prescribed 3 tablets daily.  I recommended he take them earlier and hopes that he will not be as fatigued in the morning.  Otherwise he is doing well. Denies any nausea, vomiting, or diarrhea. Denies any new pains. Had not noticed any recent bleeding such as epistaxis, hematuria or hematochezia. Denies recent chest pain on exertion, shortness of breath on minimal exertion, pre-syncopal episodes, or palpitations. Denies any numbness or tingling in hands or feet. Denies any recent fevers, infections, or recent hospitalizations. Patient reports appetite at 100% and energy level at 75%.  He is eating well and maintaining his weight at this time.    REVIEW OF SYSTEMS:  Review of Systems  Constitutional: Positive for fatigue.  Respiratory: Positive for shortness of breath.   Cardiovascular: Positive for leg swelling.  All other systems reviewed and are negative.    PAST MEDICAL/SURGICAL HISTORY:  Past Medical  History:  Diagnosis Date  . COPD (chronic obstructive pulmonary disease) ([place])   . Coronary artery disease    DES to mid LAD August 2014 ([facility])  . Diabetes mellitus without complication ([place])   . Essential hypertension   . Hyperthyroidism   . NSTEMI (non-ST elevated myocardial infarction) ([place]) 2014  . Prostate cancer [facility])    Past Surgical History:  Procedure Laterality Date  . APPENDECTOMY    . LYMPHADENECTOMY Bilateral 06/27/2014   Procedure: LYMPHADENECTOMY;  Surgeon: Raynelle Bring, MD;  Location: [facility];  Service: Urology;  Laterality: Bilateral;  . ROBOT ASSISTED LAPAROSCOPIC RADICAL PROSTATECTOMY N/A 06/27/2014   Procedure: ROBOTIC ASSISTED LAPAROSCOPIC RADICAL PROSTATECTOMY LEVEL 3;  Surgeon: Raynelle Bring, MD;  Location: [facility];  Service: Urology;  Laterality: N/A;  . TONSILLECTOMY       SOCIAL HISTORY:  Social History   Socioeconomic History  . Marital status: Married    Spouse name: Not on file  . Number of children: Not on file  . Years of education: Not on file  . Highest education level: Not on file  Occupational History  . Not on file  Social Needs  . Financial resource strain: Not on file  . Food insecurity:    Worry: Not on file    Inability: Not on file  . Transportation needs:    Medical: Not on file    Non-medical: Not on file  Tobacco Use  . Smoking status: Former Smoker    Years: 40.00    Types: Cigarettes    Last attempt to quit: 06/21/2012    Years since quitting: 6.3  . Smokeless tobacco: Never Used  Substance and Sexual Activity  . Alcohol use: Yes    Comment: occasional  . Drug use: No  . Sexual activity: Not on file  Lifestyle  . Physical activity:    Days per week: Not on file    Minutes per session: Not on file  . Stress: Not on file  Relationships  . Social connections:    Talks on phone: Not on file    Gets together: Not on file    Attends religious service: Not on file    Active member of club or organization: Not on file     Attends meetings of clubs or organizations: Not on file    Relationship status: Not on file  . Intimate partner violence:    Fear of current or ex partner: Not on file    Emotionally abused: Not on file    Physically abused: Not on file    Forced sexual activity: Not on file  Other Topics Concern  . Not on file  Social History Narrative  . Not on file    FAMILY HISTORY:  Family History  Problem Relation Age of Onset  . Dementia Mother   . Thyroid disease Mother   . Kidney Stones Father   . Stroke Brother     CURRENT MEDICATIONS:  Outpatient Encounter Medications as of 11/08/2018  Medication Sig  . abiraterone acetate (ZYTIGA) 250 MG tablet Take 3 tablets (750 mg total) by mouth daily. Take on an empty stomach 1 hour before or 2  hours after a meal  . albuterol (PROVENTIL HFA;VENTOLIN HFA) 108 (90 BASE) MCG/ACT inhaler Inhale 1-2 puffs into the lungs every 6 (six) hours as needed for wheezing or shortness of breath.  [name] Kitchen aspirin EC 81 MG tablet Take 81 mg by mouth daily.  [name] Kitchen atorvastatin (LIPITOR) 40 MG tablet TAKE 1 TABLET BY MOUTH AT BEDTIME.  . Calcium-Magnesium-Vitamin D (CALCIUM 500 PO) Take 2 tablets by mouth daily. Per pt he takes 600mg  BID  . Co-Enzyme Q-10 100 MG CAPS Take 1 capsule by mouth daily.  . fluticasone-salmeterol (ADVAIR HFA) 115-21 MCG/ACT inhaler Inhale 2 puffs into the lungs 2 (two) times daily.   [name] Kitchen KRILL OIL PO Take 1 capsule by mouth daily.   [name] Kitchen Leuprolide Acetate, 3 Month, (LUPRON DEPOT, 43-MONTH, IM) Inject into the muscle every 3 (three) months.  [name] Kitchen losartan (COZAAR) 25 MG tablet Take 25 mg by mouth daily.  . MEGESTROL ACETATE PO Take 20 mg by mouth daily.   . methimazole (TAPAZOLE) 10 MG tablet Take 10 mg by mouth every other day.   . metoprolol succinate (TOPROL-XL) 50 MG 24 hr tablet Take 50 mg by mouth every morning. Take with or immediately following a meal.  . Multiple Vitamin (MULTIVITAMIN) tablet Take 1 tablet by mouth daily.  . predniSONE  (DELTASONE) 5 MG tablet TAKE 1 TABLET ONCE DAILY WITH BREAKFAST.   No facility-administered encounter medications on file as of 11/08/2018.     ALLERGIES:  Allergies  Allergen Reactions  . Penicillins Hives     PHYSICAL EXAM:  ECOG Performance status: 1  Vitals:   11/08/18 0913  BP: 128/66  Pulse: 79  Resp: 18  Temp: 97.7 F (36.5 C)  SpO2: 96%   Filed Weights   11/08/18 0913  Weight: 297 lb (134.7 kg)    Physical Exam Constitutional:      Appearance: Normal appearance. He is normal weight.  Cardiovascular:     Rate and Rhythm: Normal rate and regular rhythm.     Heart sounds: Normal heart sounds.  Pulmonary:     Effort: Pulmonary effort is normal.     Breath sounds: Normal breath sounds.  Abdominal:     General: Bowel sounds are normal.     Palpations: Abdomen is soft.  Musculoskeletal: Normal range of motion.  Skin:    General: Skin is warm and dry.  Neurological:     Mental Status: He is alert and oriented to person, place, and time. Mental status is at baseline.  Psychiatric:        Mood and Affect: Mood normal.        Behavior: Behavior normal.        Thought Content: Thought content normal.        Judgment: Judgment normal.      LABORATORY DATA:  I have reviewed the labs as listed.  CBC    Component Value Date/Time   WBC 5.9 11/08/2018 0854   RBC 4.02 (L) 11/08/2018 0854   HGB 13.8 11/08/2018 0854   HCT 40.1 11/08/2018 0854   PLT 132 (L) 11/08/2018 0854   MCV 99.8 11/08/2018 0854   MCH 34.3 (H) 11/08/2018 0854   MCHC 34.4 11/08/2018 0854   RDW 13.4 11/08/2018 0854   LYMPHSABS 1.7 11/08/2018 0854   MONOABS 0.5 11/08/2018 0854   EOSABS 0.2 11/08/2018 0854   BASOSABS 0.0 11/08/2018 0854   CMP Latest Ref Rng & Units 11/08/2018 10/11/2018 09/12/2018  Glucose 70 - 99 mg/dL 167(H) 172(H) 219(H)  BUN 8 -  23 mg/dL 31(H) 26(H) 31(H)  Creatinine 0.61 - 1.24 mg/dL 1.35(H) 1.07 1.18  Sodium 135 - 145 mmol/L 142 142 137  Potassium 3.5 - 5.1 mmol/L  4.7 4.4 4.2  Chloride 98 - 111 mmol/L 107 106 107  CO2 22 - 32 mmol/L 24 25 23   Calcium 8.9 - 10.3 mg/dL 8.9 8.9 8.3(L)  Total Protein 6.5 - 8.1 g/dL 6.2(L) 6.2(L) 6.0(L)  Total Bilirubin 0.3 - 1.2 mg/dL 0.9 0.9 0.6  Alkaline Phos 38 - 126 U/L 75 79 81  AST 15 - 41 U/L 22 21 21   ALT 0 - 44 U/L 22 23 20    I personally performed a face-to-face visit.  All questions were answered to patient's stated satisfaction. Encouraged patient to call with any new concerns or questions before his next visit to the cancer center and we can certain see him sooner, if needed.     ASSESSMENT & PLAN:   Prostate cancer ([place]) 1. 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  Underwent prostatectomy by Dr. Merlene Pulling on 06/27/2014, stage IIIb (PT3PN0), Gleason 4+3 is equal to 7, underwent radiation. - Developed metastatic disease in June 2018.  He was started on Lupron and denosumab.  -CT abdomen pelvis on 06/12/2018 showed worsening of the bone mets, but no visceral metastatic disease. -His bone scan dated 06/12/2018 showed worsening appearance of the scan with new focus of abnormal uptake in the right ninth rib and increase in the extent of the activity in the sacrum and posterior arc of the right sixth rib. - His last Lupron 22.5 mg was on 09/12/2018.  He is due again in 12/12/2018. - He no longer has hot flashes due to starting the megestrol - He was started on Zytiga 3 tabs and prednisone 5 mg daily on 06/16/2018. - Only side effects he is experiencing is fatigue. His potassium is within normal limits. -His blood pressure has creeped up by 10 points every month.  Today it is back down at 128/66.  We will monitor it again in 1 month. - His LFTs have been checked weekly for the first month then every 2 weeks for the last 2 months and have been within normal limits. -His PSA has been trending down.  PSA on  06/16/2018 was 1.56. PSA on 08/11/2018 was 0.27.  PSA on 10/11/2018 was 0.14. -Labs on 11/08/2018 potassium 4.7, creatinine 1.35, magnesium 1.9, LFTs are within normal limits, WBC 5.9, hemoglobin 13.8, platelets 132. - We will see him back in 4 weeks for follow-up with labs.  2. Bone metastasis: - We will continue his denosumab monthly which he is tolerating well.  His calcium level was 8.9 .  He will receive a dose today. - He is also taking calcium and vitamin D supplements twice daily. - He reports he has a yearly dentist appointment he needs to schedule.  He has no complaints with his teeth or jaw at this time.       Orders placed this encounter:  Orders Placed This Encounter  Procedures  . CBC with Differential/Platelet  . Comprehensive metabolic panel  . B[place] FNP-C [place] [phone]

## 2018-11-08 NOTE — Progress Notes (Signed)
Hunter Jones tolerated Xgeva without incident or complaint. VSS. Discharged self ambulatory in satisfactory condition.

## 2018-11-08 NOTE — Patient Instructions (Signed)
[facility] at [facility] Discharge Instructions  Follow up in 1 month with labs    Thank you for choosing [facility] at [facility] to provide your oncology and hematology care.  To afford each patient quality time with our provider, please arrive at least 15 minutes before your scheduled appointment time.   If you have a lab appointment with the Cancer Center please come in thru the  Main Entrance and check in at the main information desk  You need to re-schedule your appointment should you arrive 10 or more minutes late.  We strive to give you quality time with our providers, and arriving late affects you and other patients whose appointments are after yours.  Also, if you no show three or more times for appointments you may be dismissed from the clinic at the providers discretion.     Again, thank you for choosing [facility].  Our hope is that these requests will decrease the amount of time that you wait before being seen by our physicians.       _____________________________________________________________  Should you have questions after your visit to [facility], please contact our office at [phone] between the hours of 8:00 a.m. and 4:30 p.m.  Voicemails left after 4:00 p.m. will not be returned until the following business day.  For prescription refill requests, have your pharmacy contact our office and allow 72 hours.    Cancer Center Support Programs:   > Cancer Support Group  2nd Tuesday of the month 1pm-2pm, Journey Room    

## 2018-11-08 NOTE — Patient Instructions (Signed)
[facility] at [facility]  Discharge Instructions:  Xgeva injection received today. _______________________________________________________________  Thank you for choosing [facility] at [facility] to provide your oncology and hematology care.  To afford each patient quality time with our providers, please arrive at least 15 minutes before your scheduled appointment.  You need to re-schedule your appointment if you arrive 10 or more minutes late.  We strive to give you quality time with our providers, and arriving late affects you and other patients whose appointments are after yours.  Also, if you no show three or more times for appointments you may be dismissed from the clinic.  Again, thank you for choosing [facility]. Our hope is that these requests will allow you access to exceptional care and in a timely manner. _______________________________________________________________  If you have questions after your visit, please contact our office at [phone] between the hours of 8:30 a.m. and 5:00 p.m. Voicemails left after 4:30 p.m. will not be returned until the following business day. _______________________________________________________________  For prescription refill requests, have your pharmacy contact our office. _______________________________________________________________  Recommendations made by the consultant and any test results will be sent to your referring physician. _______________________________________________________________ 

## 2018-11-08 NOTE — Assessment & Plan Note (Addendum)
1. 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  Underwent prostatectomy by Dr. Merlene Pulling on 06/27/2014, stage IIIb (PT3PN0), Gleason 4+3 is equal to 7, underwent radiation. - Developed metastatic disease in June 2018.  He was started on Lupron and denosumab.  -CT abdomen pelvis on 06/12/2018 showed worsening of the bone mets, but no visceral metastatic disease. -His bone scan dated 06/12/2018 showed worsening appearance of the scan with new focus of abnormal uptake in the right ninth rib and increase in the extent of the activity in the sacrum and posterior arc of the right sixth rib. - His last Lupron 22.5 mg was on 09/12/2018.  He is due again in 12/12/2018. - He no longer has hot flashes due to starting the megestrol - He was started on Zytiga 3 tabs and prednisone 5 mg daily on 06/16/2018. - Only side effects he is experiencing is fatigue. His potassium is within normal limits. -His blood pressure has creeped up by 10 points every month.  Today it is back down at 128/66.  We will monitor it again in 1 month. - His LFTs have been checked weekly for the first month then every 2 weeks for the last 2 months and have been within normal limits. -His PSA has been trending down.  PSA on 06/16/2018 was 1.56. PSA on 08/11/2018 was 0.27.  PSA on 10/11/2018 was 0.14. -Labs on 11/08/2018 potassium 4.7, creatinine 1.35, magnesium 1.9, LFTs are within normal limits, WBC 5.9, hemoglobin 13.8, platelets 132. - We will see him back in 4 weeks for follow-up with labs.  2. Bone metastasis: - We will continue his denosumab monthly which he is tolerating well.  His calcium level was 8.9 .  He will receive a dose today. - He is also taking calcium and vitamin D supplements twice daily. - He reports he has a yearly dentist appointment he needs to schedule.  He has no complaints with his teeth or jaw at this time.

## 2018-11-23 ENCOUNTER — Telehealth: Payer: Self-pay | Admitting: Cardiology

## 2018-11-23 NOTE — Telephone Encounter (Signed)

## 2018-11-27 ENCOUNTER — Telehealth (INDEPENDENT_AMBULATORY_CARE_PROVIDER_SITE_OTHER): Payer: BC Managed Care – PPO | Admitting: Cardiology

## 2018-11-27 ENCOUNTER — Encounter: Payer: Self-pay | Admitting: Cardiology

## 2018-11-27 DIAGNOSIS — E782 Mixed hyperlipidemia: Secondary | ICD-10-CM | POA: Diagnosis not present

## 2018-11-27 DIAGNOSIS — Z7189 Other specified counseling: Secondary | ICD-10-CM | POA: Diagnosis not present

## 2018-11-27 DIAGNOSIS — I25119 Atherosclerotic heart disease of native coronary artery with unspecified angina pectoris: Secondary | ICD-10-CM | POA: Diagnosis not present

## 2018-11-27 DIAGNOSIS — I10 Essential (primary) hypertension: Secondary | ICD-10-CM | POA: Diagnosis not present

## 2018-11-27 NOTE — Patient Instructions (Addendum)

## 2018-11-27 NOTE — Progress Notes (Signed)
Virtual Visit via Telephone Note   This visit type was conducted due to national recommendations for restrictions regarding the COVID-19 Pandemic (e.g. social distancing) in an effort to limit this patient's exposure and mitigate transmission in our community.  Due to his co-morbid illnesses, this patient is at least at moderate risk for complications without adequate follow up.  This format is felt to be most appropriate for this patient at this time.  The patient did not have access to video technology/had technical difficulties with video requiring transitioning to audio format only (telephone).  All issues noted in this document were discussed and addressed.  No physical exam could be performed with this format.  Please refer to the patient's chart for his  consent to telehealth for [facility].   Date:  11/27/2018   ID:  Phillips Climes, DOB [birth year], MRN [id number]  Patient Location: Home Provider Location: Office  PCP:  Monico Blitz, MD  Cardiologist:  Rozann Lesches, MD Electrophysiologist:  None   Evaluation Performed:  Follow-Up Visit  Chief Complaint:   Cardiac follow-up  History of Present Illness:    Hunter Jones is a 63 y.o. male last seen in December 2019.  He did not have video access and we spoke by phone today.  He states that he has been doing well, no angina symptoms or increasing shortness of breath with typical activities.  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  I reviewed his medications which are outlined below.  Includes aspirin, Lipitor, Cozaar, and Toprol-XL.  I also reviewed his lab work done recently.  Follow-up Myoview in December 2018 was low risk as outlined below.  The patient does not have symptoms concerning for COVID-19 infection (fever, chills, cough, or new shortness of breath).    Past Medical History:  Diagnosis Date  . COPD (chronic obstructive pulmonary disease) ([place])   . Coronary artery  disease    DES to mid LAD August 2014 ([facility])  . Diabetes mellitus without complication ([place])   . Essential hypertension   . Hyperthyroidism   . NSTEMI (non-ST elevated myocardial infarction) ([place]) 2014  . Prostate cancer [facility])    Past Surgical History:  Procedure Laterality Date  . APPENDECTOMY    . LYMPHADENECTOMY Bilateral 06/27/2014   Procedure: LYMPHADENECTOMY;  Surgeon: Raynelle Bring, MD;  Location: [facility];  Service: Urology;  Laterality: Bilateral;  . ROBOT ASSISTED LAPAROSCOPIC RADICAL PROSTATECTOMY N/A 06/27/2014   Procedure: ROBOTIC ASSISTED LAPAROSCOPIC RADICAL PROSTATECTOMY LEVEL 3;  Surgeon: Raynelle Bring, MD;  Location: [facility];  Service: Urology;  Laterality: N/A;  . TONSILLECTOMY       Current Meds  Medication Sig  . abiraterone acetate (ZYTIGA) 250 MG tablet Take 3 tablets (750 mg total) by mouth daily. Take on an empty stomach 1 hour before or 2 hours after a meal  . albuterol (PROVENTIL HFA;VENTOLIN HFA) 108 (90 BASE) MCG/ACT inhaler Inhale 1-2 puffs into the lungs every 6 (six) hours as needed for wheezing or shortness of breath.  [name] Kitchen aspirin EC 81 MG tablet Take 81 mg by mouth daily.  [name] Kitchen atorvastatin (LIPITOR) 40 MG tablet TAKE 1 TABLET BY MOUTH AT BEDTIME.  . Calcium-Magnesium-Vitamin D (CALCIUM 500 PO) Take 2 tablets by mouth daily. Per pt he takes 600mg  BID  . Co-Enzyme Q-10 100 MG CAPS Take 1 capsule by mouth daily.  . fluticasone-salmeterol (ADVAIR HFA) 115-21 MCG/ACT inhaler Inhale 2 puffs into the lungs 2 (two) times  daily.   [name] Kitchen KRILL OIL PO Take 1 capsule by mouth daily.   [name] Kitchen Leuprolide Acetate, 3 Month, (LUPRON DEPOT, 13-MONTH, IM) Inject into the muscle every 3 (three) months.  [name] Kitchen losartan (COZAAR) 25 MG tablet Take 25 mg by mouth daily.  . MEGESTROL ACETATE PO Take 20 mg by mouth daily as needed.   . methimazole (TAPAZOLE) 10 MG tablet Take 10 mg by mouth every other day.   . metoprolol succinate (TOPROL-XL) 50 MG 24 hr tablet Take 50 mg by mouth every morning.  Take with or immediately following a meal.  . Multiple Vitamin (MULTIVITAMIN) tablet Take 1 tablet by mouth daily.  . Potassium 99 MG TABS Take 1 tablet by mouth daily.  . predniSONE (DELTASONE) 5 MG tablet TAKE 1 TABLET ONCE DAILY WITH BREAKFAST.     Allergies:   Penicillins   Social History   Tobacco Use  . Smoking status: Former Smoker    Years: 40.00    Types: Cigarettes    Quit date: 06/21/2012    Years since quitting: 6.4  . Smokeless tobacco: Never Used  Substance Use Topics  . Alcohol use: Yes    Comment: occasional  . Drug use: No     Family Hx: The patient's family history includes Dementia in his mother; Kidney Stones in his father; Stroke in his brother; Thyroid disease in his mother.  ROS:   Please see the history of present illness. All other systems reviewed and are negative.   Prior CV studies:   The following studies were reviewed today:  Lexiscan Myoview 06/09/2017:  Normal perfusion. LVEF 52% with apical hypokinesis. Consider echo to confirm LVEF and wall motion.  This is a low risk study.  Nuclear stress EF: 52%.  Labs/Other Tests and Data Reviewed:    EKG:  An ECG dated 05/31/2018 was personally reviewed today and demonstrated:  Normal sinus rhythm.  Recent Labs: 11/08/2018: ALT 22; BUN 31; Creatinine, Ser 1.35; Hemoglobin 13.8; Magnesium 1.9; Platelets 132; Potassium 4.7; Sodium 142  October 2019: Cholesterol 94, triglycerides 123, HDL 28, LDL 41  Wt Readings from Last 3 Encounters:  11/27/18 297 lb (134.7 kg)  11/08/18 297 lb (134.7 kg)  10/11/18 (!) 300 lb 9.6 oz (136.4 kg)     Objective:    Vital Signs:  BP 131/81   Pulse 90   Ht 5\' 11"  (1.803 m)   Wt 297 lb (134.7 kg)   BMI 41.42 kg/m    Patient spoke in full sentences, not short of breath. No audible wheezing or rhonchi.  ASSESSMENT & PLAN:    1.  CAD status post DES to the mid LAD in 2014.  He is stable without active angina on medical therapy and underwent a low risk  Myoview in December 2018.  Continue with observation.  2.  Essential hypertension, systolic in the 914N today.  No change in current regimen, he continues on Cozaar and Toprol-XL.  3.  Mixed hyperlipidemia, tolerating Lipitor.  Last LDL was 41.  COVID-19 Education: The signs and symptoms of COVID-19 were discussed with the patient and how to seek care for testing (follow up with PCP or arrange E-visit).  The importance of social distancing was discussed today.  Time:   Today, I have spent 5 minutes with the patient with telehealth technology discussing the above problems.     Medication Adjustments/Labs and Tests Ordered: Current medicines are reviewed at length with the patient today.  Concerns regarding medicines are outlined above.  Tests Ordered: No orders of the defined types were placed in this encounter.   Medication Changes: No orders of the defined types were placed in this encounter.   Follow Up:  In Person 6 months in the [place] office.  Signed, Rozann Lesches, MD  11/27/2018 9:14 AM    [place]

## 2018-12-08 ENCOUNTER — Other Ambulatory Visit (HOSPITAL_COMMUNITY): Payer: Self-pay | Admitting: Nurse Practitioner

## 2018-12-08 DIAGNOSIS — C61 Malignant neoplasm of prostate: Secondary | ICD-10-CM

## 2018-12-11 MED FILL — ABIRATERONE ACETATE 250 MG: 250 | 30 days supply | Qty: 90 | Fill #0

## 2018-12-12 ENCOUNTER — Inpatient Hospital Stay (HOSPITAL_COMMUNITY): Payer: BC Managed Care – PPO | Attending: Hematology

## 2018-12-12 ENCOUNTER — Inpatient Hospital Stay (HOSPITAL_COMMUNITY): Payer: BC Managed Care – PPO

## 2018-12-12 ENCOUNTER — Other Ambulatory Visit: Payer: Self-pay

## 2018-12-12 ENCOUNTER — Inpatient Hospital Stay (HOSPITAL_BASED_OUTPATIENT_CLINIC_OR_DEPARTMENT_OTHER): Payer: BC Managed Care – PPO | Admitting: Nurse Practitioner

## 2018-12-12 ENCOUNTER — Ambulatory Visit (HOSPITAL_COMMUNITY): Payer: BLUE CROSS/BLUE SHIELD

## 2018-12-12 DIAGNOSIS — C61 Malignant neoplasm of prostate: Secondary | ICD-10-CM | POA: Diagnosis not present

## 2018-12-12 DIAGNOSIS — Z9079 Acquired absence of other genital organ(s): Secondary | ICD-10-CM | POA: Diagnosis not present

## 2018-12-12 DIAGNOSIS — E0590 Thyrotoxicosis, unspecified without thyrotoxic crisis or storm: Secondary | ICD-10-CM | POA: Insufficient documentation

## 2018-12-12 DIAGNOSIS — C7951 Secondary malignant neoplasm of bone: Secondary | ICD-10-CM | POA: Insufficient documentation

## 2018-12-12 DIAGNOSIS — E119 Type 2 diabetes mellitus without complications: Secondary | ICD-10-CM | POA: Diagnosis not present

## 2018-12-12 DIAGNOSIS — Z79899 Other long term (current) drug therapy: Secondary | ICD-10-CM | POA: Insufficient documentation

## 2018-12-12 DIAGNOSIS — I10 Essential (primary) hypertension: Secondary | ICD-10-CM | POA: Insufficient documentation

## 2018-12-12 DIAGNOSIS — Z5111 Encounter for antineoplastic chemotherapy: Secondary | ICD-10-CM | POA: Diagnosis present

## 2018-12-12 LAB — COMPREHENSIVE METABOLIC PANEL
ALT: 25 U/L (ref 0–44)
AST: 25 U/L (ref 15–41)
Albumin: 3.8 g/dL (ref 3.5–5.0)
Alkaline Phosphatase: 73 U/L (ref 38–126)
Anion gap: 8 (ref 5–15)
BUN: 33 mg/dL — ABNORMAL HIGH (ref 8–23)
CO2: 23 mmol/L (ref 22–32)
Calcium: 9.8 mg/dL (ref 8.9–10.3)
Chloride: 109 mmol/L (ref 98–111)
Creatinine, Ser: 1.29 mg/dL — ABNORMAL HIGH (ref 0.61–1.24)
GFR calc Af Amer: >60 mL/min (ref >60)
GFR calc non Af Amer: 58 mL/min — ABNORMAL LOW (ref >60)
Glucose, Bld: 168 mg/dL — ABNORMAL HIGH (ref 70–99)
Potassium: 4.6 mmol/L (ref 3.5–5.1)
Sodium: 140 mmol/L (ref 135–145)
Total Bilirubin: 1 mg/dL (ref 0.3–1.2)
Total Protein: 6.6 g/dL (ref 6.5–8.1)

## 2018-12-12 LAB — CBC WITH DIFFERENTIAL/PLATELET
Abs Immature Granulocytes: 0.03 10*3/uL (ref 0.00–0.07)
Basophils Absolute: 0 10*3/uL (ref 0.0–0.1)
Basophils Relative: 1 %
Eosinophils Absolute: 0.2 10*3/uL (ref 0.0–0.5)
Eosinophils Relative: 2 %
HCT: 40.3 % (ref 39.0–52.0)
Hemoglobin: 14.2 g/dL (ref 13.0–17.0)
Immature Granulocytes: 0 %
Lymphocytes Relative: 22 %
Lymphs Abs: 1.5 10*3/uL (ref 0.7–4.0)
MCH: 35 pg — ABNORMAL HIGH (ref 26.0–34.0)
MCHC: 35.2 g/dL (ref 30.0–36.0)
MCV: 99.3 fL (ref 80.0–100.0)
Monocytes Absolute: 0.4 10*3/uL (ref 0.1–1.0)
Monocytes Relative: 6 %
Neutro Abs: 4.7 10*3/uL (ref 1.7–7.7)
Neutrophils Relative %: 69 %
Platelets: 140 10*3/uL — ABNORMAL LOW (ref 150–400)
RBC: 4.06 MIL/uL — ABNORMAL LOW (ref 4.22–5.81)
RDW: 13.2 % (ref 11.5–15.5)
WBC: 6.8 10*3/uL (ref 4.0–10.5)
nRBC: 0 % (ref 0.0–0.2)

## 2018-12-12 LAB — PSA: Prostatic Specific Antigen: 0.13 ng/mL (ref 0.00–4.00)

## 2018-12-12 MED ORDER — DENOSUMAB 120 MG/1.7ML ~~LOC~~ SOLN
120.0000 mg | Freq: Once | SUBCUTANEOUS | Status: AC
  Administered 2018-12-12: 120 mg via SUBCUTANEOUS

## 2018-12-12 MED ORDER — LEUPROLIDE ACETATE (3 MONTH) 22.5 MG IM KIT
PACK | INTRAMUSCULAR | Status: AC
  Filled 2018-12-12: qty 22.5

## 2018-12-12 MED ORDER — PREDNISONE 5 MG PO TABS: ORAL_TABLET | ORAL | 0 refills | Status: DC

## 2018-12-12 MED ORDER — DENOSUMAB 120 MG/1.7ML ~~LOC~~ SOLN
SUBCUTANEOUS | Status: AC
  Filled 2018-12-12: qty 1.7

## 2018-12-12 MED ORDER — LEUPROLIDE ACETATE (3 MONTH) 22.5 MG IM KIT
22.5000 mg | PACK | Freq: Once | INTRAMUSCULAR | Status: AC
  Administered 2018-12-12: 22.5 mg via INTRAMUSCULAR

## 2018-12-12 NOTE — Patient Instructions (Signed)
[facility] at [facility] Discharge Instructions  Follow up in 2 months with labs   Thank you for choosing [facility] at [facility] to provide your oncology and hematology care.  To afford each patient quality time with our provider, please arrive at least 15 minutes before your scheduled appointment time.   If you have a lab appointment with the Cancer Center please come in thru the  Main Entrance and check in at the main information desk  You need to re-schedule your appointment should you arrive 10 or more minutes late.  We strive to give you quality time with our providers, and arriving late affects you and other patients whose appointments are after yours.  Also, if you no show three or more times for appointments you may be dismissed from the clinic at the providers discretion.     Again, thank you for choosing [facility].  Our hope is that these requests will decrease the amount of time that you wait before being seen by our physicians.       _____________________________________________________________  Should you have questions after your visit to [facility], please contact our office at [phone] between the hours of 8:00 a.m. and 4:30 p.m.  Voicemails left after 4:00 p.m. will not be returned until the following business day.  For prescription refill requests, have your pharmacy contact our office and allow 72 hours.    Cancer Center Support Programs:   > Cancer Support Group  2nd Tuesday of the month 1pm-2pm, Journey Room    

## 2018-12-12 NOTE — Assessment & Plan Note (Addendum)
1. 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  Underwent prostatectomy by Dr. Merlene Pulling on 06/27/2014, stage IIIb (PT3PN0), Gleason 4+3 is equal to 7, underwent radiation. - Developed metastatic disease in June 2018.  He was started on Lupron and denosumab.  -CT abdomen pelvis on 06/12/2018 showed worsening of the bone mets, but no visceral metastatic disease. -His bone scan dated 06/12/2018 showed worsening appearance of the scan with new focus of abnormal uptake in the right ninth rib and increase in the extent of the activity in the sacrum and posterior arc of the right sixth rib. - His last Lupron 22.5 mg was on 09/12/2018.  He is due again today 12/12/2018. - He no longer has hot flashes due to starting the megestrol - He was started on Zytiga 3 tabs and prednisone 5 mg daily on 06/16/2018. - Only side effects he is experiencing is fatigue. His potassium is within normal limits. -His blood pressure increased over the first few months but it has returned to normal today's blood pressure is 130/64.  We will monitor it again in 1 month. - His LFTs have been checked weekly for the first month then every 2 weeks for the last 2 months and have been within normal limits. -His PSA has been trending down.  PSA on 06/16/2018 was 1.56. PSA on 08/11/2018 was 0.27.  PSA on 11/08/2018 was 0.15. -Labs on 12/12/2018 potassium was 4.6, creatinine 1.29, LFTs are within normal limits, WBC 6.8, hemoglobin 14.2, platelets 140. - We will see him back in 8 weeks for follow-up with labs.  2. Bone metastasis: - We will continue his denosumab monthly which he is tolerating well.  His calcium level was 9.8.  He will receive a dose today. - He is also taking calcium and vitamin D supplements twice daily. - He reports he has a yearly dentist appointment he needs to schedule.  He has no complaints with his teeth or jaw at this  time.

## 2018-12-12 NOTE — Progress Notes (Signed)
1400 Labs reviewed with and pt seen by RLockamy NP and pt approved for Lupron and Xgeva injections today per NP. Phillips Climes tolerated Lupron and Xgeva injections well without complaints or incident. Calcium 9.8 today and pt denied any tooth or jaw pain and no recent or future dental visits prior to administering the Xgeva injection.Pt discharged self ambulatory in satisfactory condition

## 2018-12-12 NOTE — Progress Notes (Signed)
Hunter Jones, [place] [id number]   CLINIC:  Medical Oncology/Hematology  PCP:  Hunter Jones, [place] [address] [phone]   REASON FOR VISIT: Follow-up for [place]  CURRENT THERAPY: Zytiga and prednisone and Lupron injections  BRIEF ONCOLOGIC HISTORY:  Oncology History  Prostate Jones ([place])  02/06/2014 Procedure   Prostate biopsy by Dr. 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  Total Gleason score equals 7.  Proportion of prostatic tissue involved by tumor: 70%.   05/03/2014 Imaging   Bone scan- Negative    06/27/2014 Procedure   Radical resection of prostate by Dr. Raynelle Jones   07/29/2014 Pathology Results   1. Prostate, radical resection - PROSTATIC ADENOCARCINOMA, GLEASON SCORE 4 + 3 = 7. - RIGHT AND LEFT PROSTATE INVOLVED. - RIGHT AND LEFT SEMINAL VESICLES INVOLVED BY TUMOR. - EXTRACAPSULAR EXTENSION BY TUMOR. - TUMOR EXTENDS INTO BLADDER NECK TISSUE. - MARGINS NOT INVOLVED. 2. Lymph nodes, regional resection, left pelvic - THREE BENIGN LYMPH NODES (0/3). 3. Lymph nodes, regional resection, right pelvic - FOUR BENIGN LYMPH NODES (0/4).   11/16/2016 Imaging   Bone scan- New areas of increased uptake superolateral to the left orbit (faint), at S1, and in the posterolateral aspect of the left sixth rib.    11/25/2016 Imaging   CT CAP- 1. Status post radical prostatectomy. No findings to suggest local recurrence of disease or definite extraskeletal metastatic disease in the chest, abdomen or pelvis. However, there are several osseous lesions, as above, concerning for metastatic disease to the bones. 2. Hepatic steatosis. 3. Aortic atherosclerosis, in addition to 2 vessel coronary artery disease. Please note that although the presence of coronary artery  calcium documents the presence of coronary artery disease, the severity of this disease and any potential stenosis cannot be assessed on this non-gated CT examination. Assessment for potential risk factor modification, dietary therapy or pharmacologic therapy may be warranted, if clinically indicated. 4. Additional incidental findings, as above.      Jones STAGING: Jones Staging Prostate Jones [facility]) Staging form: Prostate, AJCC 7th Edition - Pathologic stage from 07/29/2014: Stage III (T3b, N0, cM0, Gleason 7) - Signed by Hunter Cancer, PA-C on 11/09/2016 - Clinical: Stage IV (T3, N0, M1, PSA: Less than 10, Gleason 7) - Signed by Hunter First, MD on 12/21/2016 - Pathologic: No stage assigned - Unsigned    INTERVAL HISTORY:  Hunter Jones 63 y.o. male returns for routine follow-up prostate Jones.  He reports he is having some fatigue throughout the day.  He reports shortness of breath with exertion only. Denies any nausea, vomiting, or diarrhea. Denies any new pains. Had not noticed any recent bleeding such as epistaxis, hematuria or hematochezia. Denies recent chest pain on exertion, shortness of breath on minimal exertion, pre-syncopal episodes, or palpitations. Denies any numbness or tingling in hands or feet. Denies any recent fevers, infections, or recent hospitalizations. Patient reports appetite at 100% and energy level at 75%.  He is eating well maintaining his weight at this time.    REVIEW OF SYSTEMS:  Review of Systems  Respiratory: Positive for shortness of breath.   Cardiovascular: Positive for leg swelling.  Neurological: Positive for dizziness.  Hematological: Bruises/bleeds easily.  All other systems reviewed and are negative.    PAST MEDICAL/SURGICAL HISTORY:  Past Medical History:  Diagnosis Date  .  COPD (chronic obstructive pulmonary disease) ([place])   . Coronary artery disease    DES to mid LAD August 2014 ([facility])  . Diabetes mellitus without complication  ([place])   . Essential hypertension   . Hyperthyroidism   . NSTEMI (non-ST elevated myocardial infarction) ([place]) 2014  . Prostate Jones [facility])    Past Surgical History:  Procedure Laterality Date  . APPENDECTOMY    . LYMPHADENECTOMY Bilateral 06/27/2014   Procedure: LYMPHADENECTOMY;  Surgeon: Hunter Bring, MD;  Location: [facility];  Service: Urology;  Laterality: Bilateral;  . ROBOT ASSISTED LAPAROSCOPIC RADICAL PROSTATECTOMY N/A 06/27/2014   Procedure: ROBOTIC ASSISTED LAPAROSCOPIC RADICAL PROSTATECTOMY LEVEL 3;  Surgeon: Hunter Bring, MD;  Location: [facility];  Service: Urology;  Laterality: N/A;  . TONSILLECTOMY       SOCIAL HISTORY:  Social History   Socioeconomic History  . Marital status: Married    Spouse name: Not on file  . Number of children: Not on file  . Years of education: Not on file  . Highest education level: Not on file  Occupational History  . Not on file  Social Needs  . Financial resource strain: Not on file  . Food insecurity    Worry: Not on file    Inability: Not on file  . Transportation needs    Medical: Not on file    Non-medical: Not on file  Tobacco Use  . Smoking status: Former Smoker    Years: 40.00    Types: Cigarettes    Quit date: 06/21/2012    Years since quitting: 6.4  . Smokeless tobacco: Never Used  Substance and Sexual Activity  . Alcohol use: Yes    Comment: occasional  . Drug use: No  . Sexual activity: Not on file  Lifestyle  . Physical activity    Days per week: Not on file    Minutes per session: Not on file  . Stress: Not on file  Relationships  . Social Herbalist on phone: Not on file    Gets together: Not on file    Attends religious service: Not on file    Active member of club or organization: Not on file    Attends meetings of clubs or organizations: Not on file    Relationship status: Not on file  . Intimate partner violence    Fear of current or ex partner: Not on file    Emotionally abused: Not on file     Physically abused: Not on file    Forced sexual activity: Not on file  Other Topics Concern  . Not on file  Social History Narrative  . Not on file    FAMILY HISTORY:  Family History  Problem Relation Age of Onset  . Dementia Mother   . Thyroid disease Mother   . Kidney Stones Father   . Stroke Brother     CURRENT MEDICATIONS:  Outpatient Encounter Medications as of 12/12/2018  Medication Sig  . abiraterone acetate (ZYTIGA) 250 MG tablet TAKE 3 TABLETS (750 MG TOTAL) BY MOUTH DAILY. TAKE ON AN EMPTY STOMACH 1 HOUR BEFORE OR 2 HOURS AFTER A MEAL  . albuterol (PROVENTIL HFA;VENTOLIN HFA) 108 (90 BASE) MCG/ACT inhaler Inhale 1-2 puffs into the lungs every 6 (six) hours as needed for wheezing or shortness of breath.  [name] Kitchen aspirin EC 81 MG tablet Take 81 mg by mouth every other day.   [name] Kitchen atorvastatin (LIPITOR) 40 MG tablet TAKE 1 TABLET BY MOUTH AT BEDTIME.  [name] Kitchen  Calcium-Magnesium-Vitamin D (CALCIUM 500 PO) Take 2 tablets by mouth daily. Per pt he takes 600mg  BID  . Co-Enzyme Q-10 100 MG CAPS Take 1 capsule by mouth daily.  . fluticasone-salmeterol (ADVAIR HFA) 115-21 MCG/ACT inhaler Inhale 2 puffs into the lungs 2 (two) times daily.   [name] Kitchen KRILL OIL PO Take 1 capsule by mouth daily.   [name] Kitchen Leuprolide Acetate, 3 Month, (LUPRON DEPOT, 34-MONTH, IM) Inject into the muscle every 3 (three) months.  [name] Kitchen losartan (COZAAR) 25 MG tablet Take 25 mg by mouth daily.  . MEGESTROL ACETATE PO Take 20 mg by mouth daily as needed.   . methimazole (TAPAZOLE) 10 MG tablet Take 10 mg by mouth every other day.   . metoprolol succinate (TOPROL-XL) 50 MG 24 hr tablet Take 50 mg by mouth every morning. Take with or immediately following a meal.  . Multiple Vitamin (MULTIVITAMIN) tablet Take 1 tablet by mouth daily.  . Potassium 99 MG TABS Take 1 tablet by mouth daily.  . predniSONE (DELTASONE) 5 MG tablet TAKE 1 TABLET ONCE DAILY WITH BREAKFAST.  . [DISCONTINUED] predniSONE (DELTASONE) 5 MG tablet TAKE 1 TABLET ONCE  DAILY WITH BREAKFAST.   No facility-administered encounter medications on file as of 12/12/2018.     ALLERGIES:  Allergies  Allergen Reactions  . Penicillins Hives     PHYSICAL EXAM:  ECOG Performance status: 1  Vitals:   12/12/18 1332  BP: 130/69  Pulse: 90  Resp: 18  Temp: (!) 96.9 F (36.1 C)  SpO2: 95%   Filed Weights   12/12/18 1332  Weight: 297 lb 4.8 oz (134.9 kg)    Physical Exam Constitutional:      Appearance: Normal appearance. He is normal weight.  Cardiovascular:     Rate and Rhythm: Normal rate and regular rhythm.     Heart sounds: Normal heart sounds.  Pulmonary:     Effort: Pulmonary effort is normal.     Breath sounds: Normal breath sounds.  Abdominal:     General: Bowel sounds are normal.     Palpations: Abdomen is soft.  Musculoskeletal: Normal range of motion.  Skin:    General: Skin is warm and dry.  Neurological:     Mental Status: He is alert and oriented to person, place, and time. Mental status is at baseline.  Psychiatric:        Mood and Affect: Mood normal.        Behavior: Behavior normal.        Thought Content: Thought content normal.        Judgment: Judgment normal.      LABORATORY DATA:  I have reviewed the labs as listed.  CBC    Component Value Date/Time   WBC 6.8 12/12/2018 1246   RBC 4.06 (L) 12/12/2018 1246   HGB 14.2 12/12/2018 1246   HCT 40.3 12/12/2018 1246   PLT 140 (L) 12/12/2018 1246   MCV 99.3 12/12/2018 1246   MCH 35.0 (H) 12/12/2018 1246   MCHC 35.2 12/12/2018 1246   RDW 13.2 12/12/2018 1246   LYMPHSABS 1.5 12/12/2018 1246   MONOABS 0.4 12/12/2018 1246   EOSABS 0.2 12/12/2018 1246   BASOSABS 0.0 12/12/2018 1246   CMP Latest Ref Rng & Units 12/12/2018 11/08/2018 10/11/2018  Glucose 70 - 99 mg/dL 168(H) 167(H) 172(H)  BUN 8 - 23 mg/dL 33(H) 31(H) 26(H)  Creatinine 0.61 - 1.24 mg/dL 1.29(H) 1.35(H) 1.07  Sodium 135 - 145 mmol/L 140 142 142  Potassium 3.5 - 5.1 mmol/L  4.6 4.7 4.4  Chloride 98 - 111  mmol/L 109 107 106  CO2 22 - 32 mmol/L 23 24 25   Calcium 8.9 - 10.3 mg/dL 9.8 8.9 8.9  Total Protein 6.5 - 8.1 g/dL 6.6 6.2(L) 6.2(L)  Total Bilirubin 0.3 - 1.2 mg/dL 1.0 0.9 0.9  Alkaline Phos 38 - 126 U/L 73 75 79  AST 15 - 41 U/L 25 22 21   ALT 0 - 44 U/L 25 22 23      I personally performed a face-to-face visit.  All questions were answered to patient's stated satisfaction. Encouraged patient to call with any new concerns or questions before his next visit to the [facility] and we can certain see him sooner, if needed.     ASSESSMENT & PLAN:   Prostate Jones ([place]) 1. Metastatic castration sensitive prostate Jones to the bones: -Initially diagnosed with prostate Jones 4 years ago initial biopsy on 02/06/2014 and [facility].  Underwent prostatectomy by Dr. Merlene Pulling on 06/27/2014, stage IIIb (PT3PN0), Gleason 4+3 is equal to 7, underwent radiation. - Developed metastatic disease in June 2018.  He was started on Lupron and denosumab.  -CT abdomen pelvis on 06/12/2018 showed worsening of the bone mets, but no visceral metastatic disease. -His bone scan dated 06/12/2018 showed worsening appearance of the scan with new focus of abnormal uptake in the right ninth rib and increase in the extent of the activity in the sacrum and posterior arc of the right sixth rib. - His last Lupron 22.5 mg was on 09/12/2018.  He is due again today 12/12/2018. - He no longer has hot flashes due to starting the megestrol - He was started on Zytiga 3 tabs and prednisone 5 mg daily on 06/16/2018. - Only side effects he is experiencing is fatigue. His potassium is within normal limits. -His blood pressure increased over the Jones few months but it has returned to normal today's blood pressure is 130/64.  We will monitor it again in 1 month. - His LFTs have been checked weekly for the Jones month then every 2 weeks for the last 2 months and have been within normal limits. -His PSA has been trending down.  PSA on 06/16/2018 was  1.56. PSA on 08/11/2018 was 0.27.  PSA on 11/08/2018 was 0.15. -Labs on 12/12/2018 potassium was 4.6, creatinine 1.29, LFTs are within normal limits, WBC 6.8, hemoglobin 14.2, platelets 140. - We will see him back in 8 weeks for follow-up with labs.  2. Bone metastasis: - We will continue his denosumab monthly which he is tolerating well.  His calcium level was 9.8.  He will receive a dose today. - He is also taking calcium and vitamin D supplements twice daily. - He reports he has a yearly dentist appointment he needs to schedule.  He has no complaints with his teeth or jaw at this time.       Orders placed this encounter:  Orders Placed This Encounter  Procedures  . PSA  . CBC with Differential/Platelet  . Comprehensive metabolic panel     Francene Finders, FNP-C [place] [phone]

## 2018-12-12 NOTE — Patient Instructions (Signed)
[place] at [facility] Discharge Instructions  Received Lupron and Xgeva injections today. Follow-up as scheduled. Call clinic for any questions or concerns   Thank you for choosing [place] at [facility] to provide your oncology and hematology care.  To afford each patient quality time with our provider, please arrive at least 15 minutes before your scheduled appointment time.   If you have a lab appointment with the [facility] please come in thru the  Main Entrance and check in at the main information desk  You need to re-schedule your appointment should you arrive 10 or more minutes late.  We strive to give you quality time with our providers, and arriving late affects you and other patients whose appointments are after yours.  Also, if you no show three or more times for appointments you may be dismissed from the clinic at the providers discretion.     Again, thank you for choosing [facility].  Our hope is that these requests will decrease the amount of time that you wait before being seen by our physicians.       _____________________________________________________________  Should you have questions after your visit to [facility], please contact our office at [phone] between the hours of 8:00 a.m. and 4:30 p.m.  Voicemails left after 4:00 p.m. will not be returned until the following business day.  For prescription refill requests, have your pharmacy contact our office and allow 72 hours.    Cancer Center Support Programs:   > Cancer Support Group  2nd Tuesday of the month 1pm-2pm, Journey Room

## 2019-01-08 ENCOUNTER — Encounter (HOSPITAL_COMMUNITY): Payer: Self-pay | Admitting: *Deleted

## 2019-01-08 ENCOUNTER — Other Ambulatory Visit (HOSPITAL_COMMUNITY): Payer: Self-pay | Admitting: Nurse Practitioner

## 2019-01-08 DIAGNOSIS — C61 Malignant neoplasm of prostate: Secondary | ICD-10-CM

## 2019-01-08 NOTE — Progress Notes (Signed)
I got a call from pharmacy and during the refill phone call the wife said patient c/o diarrhea and extreme fatigue. She said that she feels like patient is not telling us the whole truth when he comes.   I called to triage the patient and he states that he takes his Zytiga around midnight to 1 am every morning. He states that his diarrhea is just first thing in the mornings and he describes them as loose bowel movements, not water.  He states that he will have 2-3 and then it will get better.  He states at that time he also has a cup of coffee and sometimes that makes his bowels loose as well so he doesn't know if its coming from the medicine or the coffee but it happens every morning.    I provided the above information to our nurse practitioner and she is going to draw extra labs during his appointment tomorrow.

## 2019-01-09 ENCOUNTER — Inpatient Hospital Stay (HOSPITAL_COMMUNITY): Payer: BC Managed Care – PPO

## 2019-01-09 ENCOUNTER — Inpatient Hospital Stay (HOSPITAL_COMMUNITY): Payer: BC Managed Care – PPO | Attending: Hematology

## 2019-01-09 ENCOUNTER — Other Ambulatory Visit: Payer: Self-pay

## 2019-01-09 ENCOUNTER — Encounter (HOSPITAL_COMMUNITY): Payer: Self-pay

## 2019-01-09 VITALS — BP 140/63 | HR 81 | Temp 97.9°F | Resp 18

## 2019-01-09 DIAGNOSIS — C7951 Secondary malignant neoplasm of bone: Secondary | ICD-10-CM | POA: Diagnosis not present

## 2019-01-09 DIAGNOSIS — C61 Malignant neoplasm of prostate: Secondary | ICD-10-CM

## 2019-01-09 LAB — CBC WITH DIFFERENTIAL/PLATELET
Abs Immature Granulocytes: 0.03 10*3/uL (ref 0.00–0.07)
Basophils Absolute: 0 10*3/uL (ref 0.0–0.1)
Basophils Relative: 1 %
Eosinophils Absolute: 0.4 10*3/uL (ref 0.0–0.5)
Eosinophils Relative: 6 %
HCT: 40.5 % (ref 39.0–52.0)
Hemoglobin: 13.9 g/dL (ref 13.0–17.0)
Immature Granulocytes: 1 %
Lymphocytes Relative: 25 %
Lymphs Abs: 1.5 10*3/uL (ref 0.7–4.0)
MCH: 34.1 pg — ABNORMAL HIGH (ref 26.0–34.0)
MCHC: 34.3 g/dL (ref 30.0–36.0)
MCV: 99.3 fL (ref 80.0–100.0)
Monocytes Absolute: 0.5 10*3/uL (ref 0.1–1.0)
Monocytes Relative: 8 %
Neutro Abs: 3.8 10*3/uL (ref 1.7–7.7)
Neutrophils Relative %: 59 %
Platelets: 147 10*3/uL — ABNORMAL LOW (ref 150–400)
RBC: 4.08 MIL/uL — ABNORMAL LOW (ref 4.22–5.81)
RDW: 13.1 % (ref 11.5–15.5)
WBC: 6.2 10*3/uL (ref 4.0–10.5)
nRBC: 0 % (ref 0.0–0.2)

## 2019-01-09 LAB — COMPREHENSIVE METABOLIC PANEL
ALT: 19 U/L (ref 0–44)
AST: 27 U/L (ref 15–41)
Albumin: 3.6 g/dL (ref 3.5–5.0)
Alkaline Phosphatase: 71 U/L (ref 38–126)
Anion gap: 9 (ref 5–15)
BUN: 31 mg/dL — ABNORMAL HIGH (ref 8–23)
CO2: 22 mmol/L (ref 22–32)
Calcium: 9 mg/dL (ref 8.9–10.3)
Chloride: 108 mmol/L (ref 98–111)
Creatinine, Ser: 1.22 mg/dL (ref 0.61–1.24)
GFR calc Af Amer: >60 mL/min (ref >60)
GFR calc non Af Amer: >60 mL/min (ref >60)
Glucose, Bld: 195 mg/dL — ABNORMAL HIGH (ref 70–99)
Potassium: 4.4 mmol/L (ref 3.5–5.1)
Sodium: 139 mmol/L (ref 135–145)
Total Bilirubin: 1.1 mg/dL (ref 0.3–1.2)
Total Protein: 6.2 g/dL — ABNORMAL LOW (ref 6.5–8.1)

## 2019-01-09 LAB — MAGNESIUM: Magnesium: 2 mg/dL (ref 1.7–2.4)

## 2019-01-09 MED ORDER — DENOSUMAB 120 MG/1.7ML ~~LOC~~ SOLN
120.0000 mg | Freq: Once | SUBCUTANEOUS | Status: AC
  Administered 2019-01-09: 120 mg via SUBCUTANEOUS

## 2019-01-09 MED ORDER — DENOSUMAB 120 MG/1.7ML ~~LOC~~ SOLN
SUBCUTANEOUS | Status: AC
  Filled 2019-01-09: qty 1.7

## 2019-01-09 MED FILL — ABIRATERONE ACETATE 250 MG: 250 | 30 days supply | Qty: 90 | Fill #0

## 2019-01-09 NOTE — Patient Instructions (Signed)
[facility] at [facility] Discharge Instructions  Received Xgeva injection today. Follow-up as scheduled. Call clinic for any questions or concerns   Thank you for choosing [facility] at [facility] to provide your oncology and hematology care.  To afford each patient quality time with our provider, please arrive at least 15 minutes before your scheduled appointment time.   If you have a lab appointment with the Cancer Center please come in thru the  Main Entrance and check in at the main information desk  You need to re-schedule your appointment should you arrive 10 or more minutes late.  We strive to give you quality time with our providers, and arriving late affects you and other patients whose appointments are after yours.  Also, if you no show three or more times for appointments you may be dismissed from the clinic at the providers discretion.     Again, thank you for choosing [facility].  Our hope is that these requests will decrease the amount of time that you wait before being seen by our physicians.       _____________________________________________________________  Should you have questions after your visit to [facility], please contact our office at [phone] between the hours of 8:00 a.m. and 4:30 p.m.  Voicemails left after 4:00 p.m. will not be returned until the following business day.  For prescription refill requests, have your pharmacy contact our office and allow 72 hours.    Cancer Center Support Programs:   > Cancer Support Group  2nd Tuesday of the month 1pm-2pm, Journey Room   

## 2019-01-09 NOTE — Progress Notes (Signed)
Phillips Climes tolerated Xgeva injection well without complaints or incident. Calcium 9 today and pt denied any tooth or jaw pain and no recent or future dental visits prior to administering this medication. VSS Pt discharged self ambulatory in satisfactory condition

## 2019-02-02 ENCOUNTER — Other Ambulatory Visit (HOSPITAL_COMMUNITY): Payer: Self-pay | Admitting: Nurse Practitioner

## 2019-02-02 DIAGNOSIS — C61 Malignant neoplasm of prostate: Secondary | ICD-10-CM

## 2019-02-05 MED FILL — ABIRATERONE ACETATE 250 MG: 250 | 30 days supply | Qty: 90 | Fill #0

## 2019-02-06 ENCOUNTER — Inpatient Hospital Stay (HOSPITAL_BASED_OUTPATIENT_CLINIC_OR_DEPARTMENT_OTHER): Payer: BC Managed Care – PPO | Admitting: Nurse Practitioner

## 2019-02-06 ENCOUNTER — Inpatient Hospital Stay (HOSPITAL_COMMUNITY): Payer: BC Managed Care – PPO

## 2019-02-06 ENCOUNTER — Inpatient Hospital Stay (HOSPITAL_COMMUNITY): Payer: BC Managed Care – PPO | Attending: Hematology

## 2019-02-06 ENCOUNTER — Other Ambulatory Visit: Payer: Self-pay

## 2019-02-06 ENCOUNTER — Encounter (HOSPITAL_COMMUNITY): Payer: Self-pay | Admitting: Nurse Practitioner

## 2019-02-06 DIAGNOSIS — Z79899 Other long term (current) drug therapy: Secondary | ICD-10-CM | POA: Diagnosis not present

## 2019-02-06 DIAGNOSIS — Z87891 Personal history of nicotine dependence: Secondary | ICD-10-CM | POA: Diagnosis not present

## 2019-02-06 DIAGNOSIS — C7951 Secondary malignant neoplasm of bone: Secondary | ICD-10-CM | POA: Insufficient documentation

## 2019-02-06 DIAGNOSIS — Z7982 Long term (current) use of aspirin: Secondary | ICD-10-CM | POA: Diagnosis not present

## 2019-02-06 DIAGNOSIS — E0590 Thyrotoxicosis, unspecified without thyrotoxic crisis or storm: Secondary | ICD-10-CM | POA: Insufficient documentation

## 2019-02-06 DIAGNOSIS — C61 Malignant neoplasm of prostate: Secondary | ICD-10-CM

## 2019-02-06 DIAGNOSIS — Z7951 Long term (current) use of inhaled steroids: Secondary | ICD-10-CM | POA: Diagnosis not present

## 2019-02-06 DIAGNOSIS — I10 Essential (primary) hypertension: Secondary | ICD-10-CM | POA: Diagnosis not present

## 2019-02-06 DIAGNOSIS — I252 Old myocardial infarction: Secondary | ICD-10-CM | POA: Insufficient documentation

## 2019-02-06 DIAGNOSIS — E119 Type 2 diabetes mellitus without complications: Secondary | ICD-10-CM | POA: Insufficient documentation

## 2019-02-06 DIAGNOSIS — J449 Chronic obstructive pulmonary disease, unspecified: Secondary | ICD-10-CM | POA: Diagnosis not present

## 2019-02-06 DIAGNOSIS — Z5111 Encounter for antineoplastic chemotherapy: Secondary | ICD-10-CM | POA: Diagnosis present

## 2019-02-06 LAB — CBC WITH DIFFERENTIAL/PLATELET
Abs Immature Granulocytes: 0.03 10*3/uL (ref 0.00–0.07)
Basophils Absolute: 0 10*3/uL (ref 0.0–0.1)
Basophils Relative: 1 %
Eosinophils Absolute: 0.2 10*3/uL (ref 0.0–0.5)
Eosinophils Relative: 4 %
HCT: 40.4 % (ref 39.0–52.0)
Hemoglobin: 13.9 g/dL (ref 13.0–17.0)
Immature Granulocytes: 0 %
Lymphocytes Relative: 20 %
Lymphs Abs: 1.4 10*3/uL (ref 0.7–4.0)
MCH: 34.2 pg — ABNORMAL HIGH (ref 26.0–34.0)
MCHC: 34.4 g/dL (ref 30.0–36.0)
MCV: 99.5 fL (ref 80.0–100.0)
Monocytes Absolute: 0.5 10*3/uL (ref 0.1–1.0)
Monocytes Relative: 8 %
Neutro Abs: 4.6 10*3/uL (ref 1.7–7.7)
Neutrophils Relative %: 67 %
Platelets: 148 10*3/uL — ABNORMAL LOW (ref 150–400)
RBC: 4.06 MIL/uL — ABNORMAL LOW (ref 4.22–5.81)
RDW: 13.2 % (ref 11.5–15.5)
WBC: 6.8 10*3/uL (ref 4.0–10.5)
nRBC: 0 % (ref 0.0–0.2)

## 2019-02-06 LAB — COMPREHENSIVE METABOLIC PANEL
ALT: 21 U/L (ref 0–44)
AST: 20 U/L (ref 15–41)
Albumin: 3.7 g/dL (ref 3.5–5.0)
Alkaline Phosphatase: 81 U/L (ref 38–126)
Anion gap: 7 (ref 5–15)
BUN: 25 mg/dL — ABNORMAL HIGH (ref 8–23)
CO2: 24 mmol/L (ref 22–32)
Calcium: 9.5 mg/dL (ref 8.9–10.3)
Chloride: 106 mmol/L (ref 98–111)
Creatinine, Ser: 1.24 mg/dL (ref 0.61–1.24)
GFR calc Af Amer: >60 mL/min (ref >60)
GFR calc non Af Amer: >60 mL/min (ref >60)
Glucose, Bld: 186 mg/dL — ABNORMAL HIGH (ref 70–99)
Potassium: 4.5 mmol/L (ref 3.5–5.1)
Sodium: 137 mmol/L (ref 135–145)
Total Bilirubin: 1.2 mg/dL (ref 0.3–1.2)
Total Protein: 6.6 g/dL (ref 6.5–8.1)

## 2019-02-06 LAB — PSA: Prostatic Specific Antigen: 0.22 ng/mL (ref 0.00–4.00)

## 2019-02-06 MED ORDER — DENOSUMAB 120 MG/1.7ML ~~LOC~~ SOLN
120.0000 mg | Freq: Once | SUBCUTANEOUS | Status: AC
  Administered 2019-02-06: 12:00:00 120 mg via SUBCUTANEOUS
  Filled 2019-02-06: qty 1.7

## 2019-02-06 NOTE — Assessment & Plan Note (Addendum)
1. 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  Underwent prostatectomy by Dr. Merlene Pulling on 06/27/2014, stage IIIb (PT3PN0), Gleason 4+3 is equal to 7, underwent radiation. - Developed metastatic disease in June 2018.  He was started on Lupron and denosumab.  -CT abdomen pelvis on 06/12/2018 showed worsening of the bone mets, but no visceral metastatic disease. -His bone scan dated 06/12/2018 showed worsening appearance of the scan with new focus of abnormal uptake in the right ninth rib and increase in the extent of the activity in the sacrum and posterior arc of the right sixth rib. - His last Lupron 22.5 mg was on 12/12/2018.  He is due again on 03/14/2019 - He no longer has hot flashes due to starting the megestrol - He was started on Zytiga 3 tabs and prednisone 5 mg daily on 06/16/2018. - Only side effects he is experiencing is fatigue. His potassium is within normal limits. -His blood pressure increased over the first few months but it has returned to normal today's blood pressure is 130/64.  We will monitor it again in 1 month. - His LFTs have been checked weekly for the first month then every 2 weeks for the last 2 months and have been within normal limits. -His PSA has been trending down.  PSA on 06/16/2018 was 1.56. PSA on 08/11/2018 was 0.27.  PSA on 11/08/2018 was 0.15.  PSA on 02/06/2019 is pending. -Labs on 02/06/2019 showed his potassium was 4.5, creatinine 1.24, LFTs are within normal limits, WBC 6.8, hemoglobin 13.9, platelets 148. -He denies any new pains or any other signs or symptoms. - We will see him back in 8 weeks for follow-up with labs.  2. Bone metastasis: - We will continue his denosumab monthly which he is tolerating well.  His calcium level was 9.5.  He will receive a dose today. - He is also taking calcium and vitamin D supplements twice daily. - He reports he has a  yearly dentist appointment he needs to schedule.  He has no complaints with his teeth or jaw at this time.

## 2019-02-06 NOTE — Progress Notes (Signed)
[address], [place] [address]   CLINIC:  Medical Oncology/Hematology  PCP:  Monico Blitz, [place] [address] [phone]   REASON FOR VISIT: Follow-up for prostate cancer  CURRENT THERAPY: Zytiga and prednisone and Lupron injections  BRIEF ONCOLOGIC HISTORY:  Oncology History  Prostate cancer ([place])  02/06/2014 Procedure   Prostate biopsy by Dr. Clyde Lundborg   02/11/2014 Pathology Results   Prostatic adenocarcinoma identified in 5 of 6 prostate specimens with Gleason pattern showing primary pattern-grade 4, secondary pattern grade 3.  Total Gleason score equals 7.  Proportion of prostatic tissue involved by tumor: 70%.   05/03/2014 Imaging   Bone scan- Negative    06/27/2014 Procedure   Radical resection of prostate by Dr. Raynelle Bring   07/29/2014 Pathology Results   1. Prostate, radical resection - PROSTATIC ADENOCARCINOMA, GLEASON SCORE 4 + 3 = 7. - RIGHT AND LEFT PROSTATE INVOLVED. - RIGHT AND LEFT SEMINAL VESICLES INVOLVED BY TUMOR. - EXTRACAPSULAR EXTENSION BY TUMOR. - TUMOR EXTENDS INTO BLADDER NECK TISSUE. - MARGINS NOT INVOLVED. 2. Lymph nodes, regional resection, left pelvic - THREE BENIGN LYMPH NODES (0/3). 3. Lymph nodes, regional resection, right pelvic - FOUR BENIGN LYMPH NODES (0/4).   11/16/2016 Imaging   Bone scan- New areas of increased uptake superolateral to the left orbit (faint), at S1, and in the posterolateral aspect of the left sixth rib.    11/25/2016 Imaging   CT CAP- 1. Status post radical prostatectomy. No findings to suggest local recurrence of disease or definite extraskeletal metastatic disease in the chest, abdomen or pelvis. However, there are several osseous lesions, as above, concerning for metastatic disease to the bones. 2. Hepatic steatosis. 3. Aortic atherosclerosis, in addition to 2 vessel coronary artery disease. Please note that although the presence of coronary artery  calcium documents the presence of coronary artery disease, the severity of this disease and any potential stenosis cannot be assessed on this non-gated CT examination. Assessment for potential risk factor modification, dietary therapy or pharmacologic therapy may be warranted, if clinically indicated. 4. Additional incidental findings, as above.      CANCER STAGING: Cancer Staging Prostate cancer [facility]) Staging form: Prostate, AJCC 7th Edition - Pathologic stage from 07/29/2014: Stage III (T3b, N0, cM0, Gleason 7) - Signed by Baird Cancer, PA-C on 11/09/2016 - Clinical: Stage IV (T3, N0, M1, PSA: Less than 10, Gleason 7) - Signed by [name], MD on 12/21/2016 - Pathologic: No stage assigned - Unsigned    INTERVAL HISTORY:  Hunter Jones 63 y.o. male returns for routine follow-up for prostate cancer.  Patient reports he has been doing well since his last visit.  He does have fatigue first thing in the morning however it resolves after a couple hours.  He has mild shortness of breath only with activity.  He has lower leg edema that resolves at night when he elevates his legs. Denies any nausea, vomiting, or diarrhea. Denies any new pains. Had not noticed any recent bleeding such as epistaxis, hematuria or hematochezia. Denies recent chest pain on exertion, shortness of breath on minimal exertion, pre-syncopal episodes, or palpitations. Denies any numbness or tingling in hands or feet. Denies any recent fevers, infections, or recent hospitalizations. Patient reports appetite at 100% and energy level at 75%.  He is eating well maintain his weight at this time.    REVIEW OF SYSTEMS:  Review of Systems  Constitutional: Positive for fatigue.  Respiratory: Positive for  shortness of breath.   Cardiovascular: Positive for leg swelling.  All other systems reviewed and are negative.    PAST MEDICAL/SURGICAL HISTORY:  Past Medical History:  Diagnosis Date  . COPD (chronic obstructive  pulmonary disease) ([place])   . Coronary artery disease    DES to mid LAD August 2014 ([facility])  . Diabetes mellitus without complication ([place])   . Essential hypertension   . Hyperthyroidism   . NSTEMI (non-ST elevated myocardial infarction) ([place]) 2014  . Prostate cancer [facility])    Past Surgical History:  Procedure Laterality Date  . APPENDECTOMY    . LYMPHADENECTOMY Bilateral 06/27/2014   Procedure: LYMPHADENECTOMY;  Surgeon: Raynelle Bring, MD;  Location: [facility] ORS;  Service: Urology;  Laterality: Bilateral;  . ROBOT ASSISTED LAPAROSCOPIC RADICAL PROSTATECTOMY N/A 06/27/2014   Procedure: ROBOTIC ASSISTED LAPAROSCOPIC RADICAL PROSTATECTOMY LEVEL 3;  Surgeon: Raynelle Bring, MD;  Location: [facility];  Service: Urology;  Laterality: N/A;  . TONSILLECTOMY       SOCIAL HISTORY:  Social History   Socioeconomic History  . Marital status: Married    Spouse name: Not on file  . Number of children: Not on file  . Years of education: Not on file  . Highest education level: Not on file  Occupational History  . Not on file  Social Needs  . Financial resource strain: Not on file  . Food insecurity    Worry: Not on file    Inability: Not on file  . Transportation needs    Medical: Not on file    Non-medical: Not on file  Tobacco Use  . Smoking status: Former Smoker    Years: 40.00    Types: Cigarettes    Quit date: 06/21/2012    Years since quitting: 6.6  . Smokeless tobacco: Never Used  Substance and Sexual Activity  . Alcohol use: Yes    Comment: occasional  . Drug use: No  . Sexual activity: Not on file  Lifestyle  . Physical activity    Days per week: Not on file    Minutes per session: Not on file  . Stress: Not on file  Relationships  . Social Herbalist on phone: Not on file    Gets together: Not on file    Attends religious service: Not on file    Active member of club or organization: Not on file    Attends meetings of clubs or organizations: Not on file     Relationship status: Not on file  . Intimate partner violence    Fear of current or ex partner: Not on file    Emotionally abused: Not on file    Physically abused: Not on file    Forced sexual activity: Not on file  Other Topics Concern  . Not on file  Social History Narrative  . Not on file    FAMILY HISTORY:  Family History  Problem Relation Age of Onset  . Dementia Mother   . Thyroid disease Mother   . Kidney Stones Father   . Stroke Brother     CURRENT MEDICATIONS:  Outpatient Encounter Medications as of 02/06/2019  Medication Sig  . abiraterone acetate (ZYTIGA) 250 MG tablet TAKE 3 TABLETS (750 MG TOTAL) BY MOUTH DAILY. TAKE ON AN EMPTY STOMACH 1 HOUR BEFORE OR 2 HOURS AFTER A MEAL  . albuterol (PROVENTIL HFA;VENTOLIN HFA) 108 (90 BASE) MCG/ACT inhaler Inhale 1-2 puffs into the lungs every 6 (six) hours as needed for wheezing or shortness of  breath.  [name] Kitchen aspirin EC 81 MG tablet Take 81 mg by mouth every other day.   [name] Kitchen atorvastatin (LIPITOR) 40 MG tablet TAKE 1 TABLET BY MOUTH AT BEDTIME.  . Calcium-Magnesium-Vitamin D (CALCIUM 500 PO) Take 2 tablets by mouth daily. Per pt he takes 600mg  BID  . Co-Enzyme Q-10 100 MG CAPS Take 1 capsule by mouth daily.  . fluticasone-salmeterol (ADVAIR HFA) 115-21 MCG/ACT inhaler Inhale 2 puffs into the lungs 2 (two) times daily.   [name] Kitchen KRILL OIL PO Take 1 capsule by mouth daily.   [name] Kitchen Leuprolide Acetate, 3 Month, (LUPRON DEPOT, 82-MONTH, IM) Inject into the muscle every 3 (three) months.  [name] Kitchen losartan (COZAAR) 25 MG tablet Take 25 mg by mouth daily.  . MEGESTROL ACETATE PO Take 20 mg by mouth daily as needed.   . methimazole (TAPAZOLE) 10 MG tablet Take 10 mg by mouth every other day.   . metoprolol succinate (TOPROL-XL) 50 MG 24 hr tablet Take 50 mg by mouth every morning. Take with or immediately following a meal.  . Multiple Vitamin (MULTIVITAMIN) tablet Take 1 tablet by mouth daily.  . Potassium 99 MG TABS Take 1 tablet by mouth daily.  .  predniSONE (DELTASONE) 5 MG tablet TAKE 1 TABLET ONCE DAILY WITH BREAKFAST.   No facility-administered encounter medications on file as of 02/06/2019.     ALLERGIES:  Allergies  Allergen Reactions  . Penicillins Hives     PHYSICAL EXAM:  ECOG Performance status: 1  Vitals:   02/06/19 1100  BP: (!) 115/56  Pulse: 81  Resp: 18  Temp: (!) 97.1 F (36.2 C)  SpO2: 96%   Filed Weights   02/06/19 1100  Weight: (!) 301 lb 4 oz (136.6 kg)    Physical Exam Constitutional:      Appearance: Normal appearance. He is normal weight.  Cardiovascular:     Rate and Rhythm: Normal rate and regular rhythm.     Heart sounds: Normal heart sounds.  Pulmonary:     Effort: Pulmonary effort is normal.     Breath sounds: Normal breath sounds.  Abdominal:     General: Bowel sounds are normal.     Palpations: Abdomen is soft.  Musculoskeletal: Normal range of motion.  Skin:    General: Skin is warm and dry.  Neurological:     Mental Status: He is alert and oriented to person, place, and time. Mental status is at baseline.  Psychiatric:        Mood and Affect: Mood normal.        Behavior: Behavior normal.        Thought Content: Thought content normal.        Judgment: Judgment normal.      LABORATORY DATA:  I have reviewed the labs as listed.  CBC    Component Value Date/Time   WBC 6.8 02/06/2019 1007   RBC 4.06 (L) 02/06/2019 1007   HGB 13.9 02/06/2019 1007   HCT 40.4 02/06/2019 1007   PLT 148 (L) 02/06/2019 1007   MCV 99.5 02/06/2019 1007   MCH 34.2 (H) 02/06/2019 1007   MCHC 34.4 02/06/2019 1007   RDW 13.2 02/06/2019 1007   LYMPHSABS 1.4 02/06/2019 1007   MONOABS 0.5 02/06/2019 1007   EOSABS 0.2 02/06/2019 1007   BASOSABS 0.0 02/06/2019 1007   CMP Latest Ref Rng & Units 02/06/2019 01/09/2019 12/12/2018  Glucose 70 - 99 mg/dL 186(H) 195(H) 168(H)  BUN 8 - 23 mg/dL 25(H) 31(H) 33(H)  Creatinine 0.61 -  1.24 mg/dL 1.24 1.22 1.29(H)  Sodium 135 - 145 mmol/L 137 139 140   Potassium 3.5 - 5.1 mmol/L 4.5 4.4 4.6  Chloride 98 - 111 mmol/L 106 108 109  CO2 22 - 32 mmol/L 24 22 23   Calcium 8.9 - 10.3 mg/dL 9.5 9.0 9.8  Total Protein 6.5 - 8.1 g/dL 6.6 6.2(L) 6.6  Total Bilirubin 0.3 - 1.2 mg/dL 1.2 1.1 1.0  Alkaline Phos 38 - 126 U/L 81 71 73  AST 15 - 41 U/L 20 27 25   ALT 0 - 44 U/L 21 19 25     I personally performed a face-to-face visit.  All questions were answered to patient's stated satisfaction. Encouraged patient to call with any new concerns or questions before his next visit to the cancer center and we can certain see him sooner, if needed.     ASSESSMENT & PLAN:   Prostate cancer ([place]) 1. 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  Underwent prostatectomy by Dr. Merlene Pulling on 06/27/2014, stage IIIb (PT3PN0), Gleason 4+3 is equal to 7, underwent radiation. - Developed metastatic disease in June 2018.  He was started on Lupron and denosumab.  -CT abdomen pelvis on 06/12/2018 showed worsening of the bone mets, but no visceral metastatic disease. -His bone scan dated 06/12/2018 showed worsening appearance of the scan with new focus of abnormal uptake in the right ninth rib and increase in the extent of the activity in the sacrum and posterior arc of the right sixth rib. - His last Lupron 22.5 mg was on 12/12/2018.  He is due again on 03/14/2019 - He no longer has hot flashes due to starting the megestrol - He was started on Zytiga 3 tabs and prednisone 5 mg daily on 06/16/2018. - Only side effects he is experiencing is fatigue. His potassium is within normal limits. -His blood pressure increased over the first few months but it has returned to normal today's blood pressure is 130/64.  We will monitor it again in 1 month. - His LFTs have been checked weekly for the first month then every 2 weeks for the last 2 months and have been within normal limits.  -His PSA has been trending down.  PSA on 06/16/2018 was 1.56. PSA on 08/11/2018 was 0.27.  PSA on 11/08/2018 was 0.15.  PSA on 02/06/2019 is pending. -Labs on 02/06/2019 showed his potassium was 4.5, creatinine 1.24, LFTs are within normal limits, WBC 6.8, hemoglobin 13.9, platelets 148. -He denies any new pains or any other signs or symptoms. - We will see him back in 8 weeks for follow-up with labs.  2. Bone metastasis: - We will continue his denosumab monthly which he is tolerating well.  His calcium level was 9.5.  He will receive a dose today. - He is also taking calcium and vitamin D supplements twice daily. - He reports he has a yearly dentist appointment he needs to schedule.  He has no complaints with his teeth or jaw at this time.       Orders placed this encounter:  Orders Placed This Encounter  Procedures  . Lactate dehydrogenase  . PSA  . CBC with Differential/Platelet  . Comprehensive metabolic panel  . VITAMIN D 25 Hydroxy (Vit-D Deficiency, Fractures)  . Lactate dehydrogenase  . CBC with Differential/Platelet  . Comprehensive metabolic panel  . CBC with Differential/Platelet  . Comprehensive metabolic panel      Hunter Finders,  FNP-C [place] [phone]

## 2019-02-06 NOTE — Addendum Note (Signed)
Addended by: [place] on: 02/06/2019 12:14 PM   Modules accepted: Orders

## 2019-02-06 NOTE — Progress Notes (Signed)
Patient taking calcium as directed.  Denied tooth, jaw, and leg pain.  No recent or upcoming dental visits.  Patient tolerated injection with no complaints voiced.  Site clean and dry with no bruising or swelling noted at site.  Band aid applied.  Vss with discharge and left ambulatory with no s/s of distress noted.  

## 2019-02-06 NOTE — Patient Instructions (Signed)
[place] at [facility] Discharge Instructions  Follow up in 4 months with labs and CT scans   Thank you for choosing [place] at [facility] to provide your oncology and hematology care.  To afford each patient quality time with our provider, please arrive at least 15 minutes before your scheduled appointment time.   If you have a lab appointment with the [place] please come in thru the Main Entrance and check in at the main information desk.  You need to re-schedule your appointment should you arrive 10 or more minutes late.  We strive to give you quality time with our providers, and arriving late affects you and other patients whose appointments are after yours.  Also, if you no show three or more times for appointments you may be dismissed from the clinic at the providers discretion.     Again, thank you for choosing [facility].  Our hope is that these requests will decrease the amount of time that you wait before being seen by our physicians.       _____________________________________________________________  Should you have questions after your visit to [facility], please contact our office at [phone] between the hours of 8:00 a.m. and 4:30 p.m.  Voicemails left after 4:00 p.m. will not be returned until the following business day.  For prescription refill requests, have your pharmacy contact our office and allow 72 hours.    Due to Covid, you will need to wear a mask upon entering the hospital. If you do not have a mask, a mask will be given to you at the Main Entrance upon arrival. For doctor visits, patients may have 1 support person with them. For treatment visits, patients can not have anyone with them due to social distancing guidelines and our immunocompromised population.

## 2019-02-14 ENCOUNTER — Other Ambulatory Visit (HOSPITAL_COMMUNITY): Payer: Self-pay | Admitting: Nurse Practitioner

## 2019-02-14 DIAGNOSIS — C61 Malignant neoplasm of prostate: Secondary | ICD-10-CM

## 2019-02-14 DIAGNOSIS — C7951 Secondary malignant neoplasm of bone: Secondary | ICD-10-CM

## 2019-03-02 ENCOUNTER — Other Ambulatory Visit (HOSPITAL_COMMUNITY): Payer: Self-pay | Admitting: Nurse Practitioner

## 2019-03-02 DIAGNOSIS — C61 Malignant neoplasm of prostate: Secondary | ICD-10-CM

## 2019-03-06 ENCOUNTER — Encounter (HOSPITAL_COMMUNITY): Payer: Self-pay

## 2019-03-06 ENCOUNTER — Inpatient Hospital Stay (HOSPITAL_COMMUNITY): Payer: BC Managed Care – PPO | Attending: Hematology

## 2019-03-06 ENCOUNTER — Other Ambulatory Visit: Payer: Self-pay

## 2019-03-06 ENCOUNTER — Inpatient Hospital Stay (HOSPITAL_COMMUNITY): Payer: BC Managed Care – PPO

## 2019-03-06 VITALS — BP 123/69 | HR 74 | Temp 97.9°F | Resp 18

## 2019-03-06 DIAGNOSIS — C7951 Secondary malignant neoplasm of bone: Secondary | ICD-10-CM | POA: Diagnosis not present

## 2019-03-06 DIAGNOSIS — C61 Malignant neoplasm of prostate: Secondary | ICD-10-CM

## 2019-03-06 DIAGNOSIS — Z20822 Contact with and (suspected) exposure to covid-19: Secondary | ICD-10-CM

## 2019-03-06 LAB — CBC WITH DIFFERENTIAL/PLATELET
Abs Immature Granulocytes: 0.03 10*3/uL (ref 0.00–0.07)
Basophils Absolute: 0 10*3/uL (ref 0.0–0.1)
Basophils Relative: 1 %
Eosinophils Absolute: 0.4 10*3/uL (ref 0.0–0.5)
Eosinophils Relative: 5 %
HCT: 41.4 % (ref 39.0–52.0)
Hemoglobin: 14.1 g/dL (ref 13.0–17.0)
Immature Granulocytes: 0 %
Lymphocytes Relative: 21 %
Lymphs Abs: 1.6 10*3/uL (ref 0.7–4.0)
MCH: 34.6 pg — ABNORMAL HIGH (ref 26.0–34.0)
MCHC: 34.1 g/dL (ref 30.0–36.0)
MCV: 101.5 fL — ABNORMAL HIGH (ref 80.0–100.0)
Monocytes Absolute: 0.6 10*3/uL (ref 0.1–1.0)
Monocytes Relative: 8 %
Neutro Abs: 5.1 10*3/uL (ref 1.7–7.7)
Neutrophils Relative %: 65 %
Platelets: 149 10*3/uL — ABNORMAL LOW (ref 150–400)
RBC: 4.08 MIL/uL — ABNORMAL LOW (ref 4.22–5.81)
RDW: 13.3 % (ref 11.5–15.5)
WBC: 7.7 10*3/uL (ref 4.0–10.5)
nRBC: 0 % (ref 0.0–0.2)

## 2019-03-06 LAB — COMPREHENSIVE METABOLIC PANEL
ALT: 19 U/L (ref 0–44)
AST: 20 U/L (ref 15–41)
Albumin: 3.5 g/dL (ref 3.5–5.0)
Alkaline Phosphatase: 76 U/L (ref 38–126)
Anion gap: 7 (ref 5–15)
BUN: 29 mg/dL — ABNORMAL HIGH (ref 8–23)
CO2: 26 mmol/L (ref 22–32)
Calcium: 8.6 mg/dL — ABNORMAL LOW (ref 8.9–10.3)
Chloride: 106 mmol/L (ref 98–111)
Creatinine, Ser: 1.01 mg/dL (ref 0.61–1.24)
GFR calc Af Amer: >60 mL/min (ref >60)
GFR calc non Af Amer: >60 mL/min (ref >60)
Glucose, Bld: 159 mg/dL — ABNORMAL HIGH (ref 70–99)
Potassium: 4.7 mmol/L (ref 3.5–5.1)
Sodium: 139 mmol/L (ref 135–145)
Total Bilirubin: 0.6 mg/dL (ref 0.3–1.2)
Total Protein: 6.2 g/dL — ABNORMAL LOW (ref 6.5–8.1)

## 2019-03-06 MED ORDER — DENOSUMAB 120 MG/1.7ML ~~LOC~~ SOLN
120.0000 mg | Freq: Once | SUBCUTANEOUS | Status: AC
  Administered 2019-03-06: 120 mg via SUBCUTANEOUS
  Filled 2019-03-06: qty 1.7

## 2019-03-06 MED ORDER — LEUPROLIDE ACETATE (3 MONTH) 22.5 MG ~~LOC~~ KIT
22.5000 mg | PACK | Freq: Once | SUBCUTANEOUS | Status: AC
  Administered 2019-03-06: 12:00:00 22.5 mg via SUBCUTANEOUS
  Filled 2019-03-06: qty 22.5

## 2019-03-06 MED FILL — ABIRATERONE ACETATE 250 MG: 250 | 30 days supply | Qty: 90 | Fill #0

## 2019-03-06 NOTE — Progress Notes (Signed)
Phillips Climes tolerated Elig[place] and Xgeva injections well without complaints or incident. Calcium 8.6 today and pt denied any tooth or jaw pain and no recent or future dental visits prior to administering the Xgeva injection. Pt continues to take his Calcium PO as prescribed without issues. VSS and labs reviewed prior to administering injections. Pt discharged self ambulatory in satisfactory condition

## 2019-03-06 NOTE — Patient Instructions (Signed)
[place] at [facility] Discharge Instructions  Received Eligard and Delton See injections today. Follow-up as scheduled. Call clinic for any questions or concerns   Thank you for choosing [place] at [facility] to provide your oncology and hematology care.  To afford each patient quality time with our provider, please arrive at least 15 minutes before your scheduled appointment time.   If you have a lab appointment with the [facility] please come in thru the Main Entrance and check in at the main information desk.  You need to re-schedule your appointment should you arrive 10 or more minutes late.  We strive to give you quality time with our providers, and arriving late affects you and other patients whose appointments are after yours.  Also, if you no show three or more times for appointments you may be dismissed from the clinic at the providers discretion.     Again, thank you for choosing [facility].  Our hope is that these requests will decrease the amount of time that you wait before being seen by our physicians.       _____________________________________________________________  Should you have questions after your visit to [facility], please contact our office at [phone] between the hours of 8:00 a.m. and 4:30 p.m.  Voicemails left after 4:00 p.m. will not be returned until the following business day.  For prescription refill requests, have your pharmacy contact our office and allow 72 hours.    Due to Covid, you will need to wear a mask upon entering the hospital. If you do not have a mask, a mask will be given to you at the Main Entrance upon arrival. For doctor visits, patients may have 1 support person with them. For treatment visits, patients can not have anyone with them due to social distancing guidelines and our immunocompromised population.

## 2019-03-07 LAB — NOVEL CORONAVIRUS, NAA: SARS-CoV-2, NAA: NOT DETECTED

## 2019-03-14 ENCOUNTER — Other Ambulatory Visit (HOSPITAL_COMMUNITY): Payer: Self-pay | Admitting: Nurse Practitioner

## 2019-03-14 DIAGNOSIS — C61 Malignant neoplasm of prostate: Secondary | ICD-10-CM

## 2019-03-14 DIAGNOSIS — C7951 Secondary malignant neoplasm of bone: Secondary | ICD-10-CM

## 2019-03-15 ENCOUNTER — Other Ambulatory Visit (HOSPITAL_COMMUNITY): Payer: Self-pay | Admitting: Nurse Practitioner

## 2019-03-15 DIAGNOSIS — C61 Malignant neoplasm of prostate: Secondary | ICD-10-CM

## 2019-03-15 DIAGNOSIS — C7951 Secondary malignant neoplasm of bone: Secondary | ICD-10-CM

## 2019-03-15 MED ORDER — PREDNISONE 5 MG PO TABS: 5.0000 mg | ORAL_TABLET | Freq: Every day | ORAL | 2 refills | Status: DC

## 2019-04-02 ENCOUNTER — Other Ambulatory Visit: Payer: Self-pay

## 2019-04-02 ENCOUNTER — Other Ambulatory Visit (HOSPITAL_COMMUNITY): Payer: Self-pay | Admitting: Nurse Practitioner

## 2019-04-02 DIAGNOSIS — C61 Malignant neoplasm of prostate: Secondary | ICD-10-CM

## 2019-04-03 ENCOUNTER — Inpatient Hospital Stay (HOSPITAL_COMMUNITY): Payer: BC Managed Care – PPO

## 2019-04-03 ENCOUNTER — Inpatient Hospital Stay (HOSPITAL_COMMUNITY): Payer: BC Managed Care – PPO | Attending: Hematology

## 2019-04-03 VITALS — BP 135/76 | HR 68 | Temp 97.9°F | Resp 18

## 2019-04-03 DIAGNOSIS — C61 Malignant neoplasm of prostate: Secondary | ICD-10-CM

## 2019-04-03 DIAGNOSIS — C7951 Secondary malignant neoplasm of bone: Secondary | ICD-10-CM | POA: Diagnosis present

## 2019-04-03 LAB — COMPREHENSIVE METABOLIC PANEL
ALT: 23 U/L (ref 0–44)
AST: 23 U/L (ref 15–41)
Albumin: 3.6 g/dL (ref 3.5–5.0)
Alkaline Phosphatase: 71 U/L (ref 38–126)
Anion gap: 7 (ref 5–15)
BUN: 31 mg/dL — ABNORMAL HIGH (ref 8–23)
CO2: 24 mmol/L (ref 22–32)
Calcium: 8.8 mg/dL — ABNORMAL LOW (ref 8.9–10.3)
Chloride: 107 mmol/L (ref 98–111)
Creatinine, Ser: 1.27 mg/dL — ABNORMAL HIGH (ref 0.61–1.24)
GFR calc Af Amer: >60 mL/min (ref >60)
GFR calc non Af Amer: 59 mL/min — ABNORMAL LOW (ref >60)
Glucose, Bld: 169 mg/dL — ABNORMAL HIGH (ref 70–99)
Potassium: 4.4 mmol/L (ref 3.5–5.1)
Sodium: 138 mmol/L (ref 135–145)
Total Bilirubin: 0.8 mg/dL (ref 0.3–1.2)
Total Protein: 6.2 g/dL — ABNORMAL LOW (ref 6.5–8.1)

## 2019-04-03 LAB — CBC WITH DIFFERENTIAL/PLATELET
Abs Immature Granulocytes: 0.04 10*3/uL (ref 0.00–0.07)
Basophils Absolute: 0.1 10*3/uL (ref 0.0–0.1)
Basophils Relative: 1 %
Eosinophils Absolute: 0.3 10*3/uL (ref 0.0–0.5)
Eosinophils Relative: 4 %
HCT: 43.9 % (ref 39.0–52.0)
Hemoglobin: 15 g/dL (ref 13.0–17.0)
Immature Granulocytes: 1 %
Lymphocytes Relative: 24 %
Lymphs Abs: 2 10*3/uL (ref 0.7–4.0)
MCH: 34.8 pg — ABNORMAL HIGH (ref 26.0–34.0)
MCHC: 34.2 g/dL (ref 30.0–36.0)
MCV: 101.9 fL — ABNORMAL HIGH (ref 80.0–100.0)
Monocytes Absolute: 0.6 10*3/uL (ref 0.1–1.0)
Monocytes Relative: 7 %
Neutro Abs: 5.3 10*3/uL (ref 1.7–7.7)
Neutrophils Relative %: 63 %
Platelets: 121 10*3/uL — ABNORMAL LOW (ref 150–400)
RBC: 4.31 MIL/uL (ref 4.22–5.81)
RDW: 12.6 % (ref 11.5–15.5)
WBC: 8.3 10*3/uL (ref 4.0–10.5)
nRBC: 0 % (ref 0.0–0.2)

## 2019-04-03 MED ORDER — DENOSUMAB 120 MG/1.7ML ~~LOC~~ SOLN
120.0000 mg | Freq: Once | SUBCUTANEOUS | Status: AC
  Administered 2019-04-03: 120 mg via SUBCUTANEOUS
  Filled 2019-04-03: qty 1.7

## 2019-04-03 NOTE — Progress Notes (Signed)
Pt presents today for XGeva injection. Labs reviewed with RNester NP. Proceed with injection today.   Hunter Jones presents today for injection per MD orders. XGeva administered SQ in left Abdomen. Administration without incident. Patient tolerated well. Vital signs stable. No complaints at this time. Discharged from clinic ambulatory. F/U with [facility] as scheduled.

## 2019-04-03 NOTE — Patient Instructions (Signed)
[facility] at [facility]  Discharge Instructions:   _______________________________________________________________  Thank you for choosing [facility] at [facility] to provide your oncology and hematology care.  To afford each patient quality time with our providers, please arrive at least 15 minutes before your scheduled appointment.  You need to re-schedule your appointment if you arrive 10 or more minutes late.  We strive to give you quality time with our providers, and arriving late affects you and other patients whose appointments are after yours.  Also, if you no show three or more times for appointments you may be dismissed from the clinic.  Again, thank you for choosing [facility] at [facility]. Our hope is that these requests will allow you access to exceptional care and in a timely manner. _______________________________________________________________  If you have questions after your visit, please contact our office at [phone] between the hours of 8:30 a.m. and 5:00 p.m. Voicemails left after 4:30 p.m. will not be returned until the following business day. _______________________________________________________________  For prescription refill requests, have your pharmacy contact our office. _______________________________________________________________  Recommendations made by the consultant and any test results will be sent to your referring physician. _______________________________________________________________ 

## 2019-04-05 MED FILL — ABIRATERONE ACETATE 250 MG: 250 | 30 days supply | Qty: 90 | Fill #0

## 2019-04-07 MED FILL — predniSONE 5 MG TABS: 5 | 30 days supply | Qty: 30 | Fill #0

## 2019-04-11 ENCOUNTER — Other Ambulatory Visit: Payer: Self-pay | Admitting: *Deleted

## 2019-04-11 DIAGNOSIS — Z20822 Contact with and (suspected) exposure to covid-19: Secondary | ICD-10-CM

## 2019-04-12 LAB — NOVEL CORONAVIRUS, NAA: SARS-CoV-2, NAA: NOT DETECTED

## 2019-04-16 ENCOUNTER — Telehealth: Payer: Self-pay | Admitting: *Deleted

## 2019-04-16 NOTE — Telephone Encounter (Signed)
Reviewed negative covid19 results with patient. No questions asked.  

## 2019-04-30 ENCOUNTER — Other Ambulatory Visit (HOSPITAL_COMMUNITY): Payer: Self-pay | Admitting: Nurse Practitioner

## 2019-04-30 DIAGNOSIS — C61 Malignant neoplasm of prostate: Secondary | ICD-10-CM

## 2019-05-01 ENCOUNTER — Inpatient Hospital Stay (HOSPITAL_COMMUNITY): Payer: Medicare Other | Attending: Hematology

## 2019-05-01 ENCOUNTER — Inpatient Hospital Stay (HOSPITAL_COMMUNITY): Payer: Medicare Other

## 2019-05-01 ENCOUNTER — Other Ambulatory Visit: Payer: Self-pay

## 2019-05-01 VITALS — BP 128/71 | HR 72 | Temp 97.2°F | Resp 18

## 2019-05-01 DIAGNOSIS — C61 Malignant neoplasm of prostate: Secondary | ICD-10-CM | POA: Diagnosis present

## 2019-05-01 DIAGNOSIS — C7951 Secondary malignant neoplasm of bone: Secondary | ICD-10-CM | POA: Insufficient documentation

## 2019-05-01 LAB — COMPREHENSIVE METABOLIC PANEL
ALT: 24 U/L (ref 0–44)
AST: 22 U/L (ref 15–41)
Albumin: 3.7 g/dL (ref 3.5–5.0)
Alkaline Phosphatase: 78 U/L (ref 38–126)
Anion gap: 10 (ref 5–15)
BUN: 32 mg/dL — ABNORMAL HIGH (ref 8–23)
CO2: 21 mmol/L — ABNORMAL LOW (ref 22–32)
Calcium: 9 mg/dL (ref 8.9–10.3)
Chloride: 106 mmol/L (ref 98–111)
Creatinine, Ser: 1.23 mg/dL (ref 0.61–1.24)
GFR calc Af Amer: >60 mL/min (ref >60)
GFR calc non Af Amer: >60 mL/min (ref >60)
Glucose, Bld: 199 mg/dL — ABNORMAL HIGH (ref 70–99)
Potassium: 4.3 mmol/L (ref 3.5–5.1)
Sodium: 137 mmol/L (ref 135–145)
Total Bilirubin: 1 mg/dL (ref 0.3–1.2)
Total Protein: 6.3 g/dL — ABNORMAL LOW (ref 6.5–8.1)

## 2019-05-01 LAB — CBC WITH DIFFERENTIAL/PLATELET
Abs Immature Granulocytes: 0.02 10*3/uL (ref 0.00–0.07)
Basophils Absolute: 0 10*3/uL (ref 0.0–0.1)
Basophils Relative: 1 %
Eosinophils Absolute: 0.2 10*3/uL (ref 0.0–0.5)
Eosinophils Relative: 4 %
HCT: 42.4 % (ref 39.0–52.0)
Hemoglobin: 14.4 g/dL (ref 13.0–17.0)
Immature Granulocytes: 0 %
Lymphocytes Relative: 28 %
Lymphs Abs: 1.8 10*3/uL (ref 0.7–4.0)
MCH: 34 pg (ref 26.0–34.0)
MCHC: 34 g/dL (ref 30.0–36.0)
MCV: 100.2 fL — ABNORMAL HIGH (ref 80.0–100.0)
Monocytes Absolute: 0.4 10*3/uL (ref 0.1–1.0)
Monocytes Relative: 7 %
Neutro Abs: 3.8 10*3/uL (ref 1.7–7.7)
Neutrophils Relative %: 60 %
Platelets: 123 10*3/uL — ABNORMAL LOW (ref 150–400)
RBC: 4.23 MIL/uL (ref 4.22–5.81)
RDW: 13.2 % (ref 11.5–15.5)
WBC: 6.3 10*3/uL (ref 4.0–10.5)
nRBC: 0 % (ref 0.0–0.2)

## 2019-05-01 LAB — LACTATE DEHYDROGENASE: LDH: 193 U/L — ABNORMAL HIGH (ref 98–192)

## 2019-05-01 LAB — PSA: Prostatic Specific Antigen: 0.43 ng/mL (ref 0.00–4.00)

## 2019-05-01 MED ORDER — DENOSUMAB 120 MG/1.7ML ~~LOC~~ SOLN
120.0000 mg | Freq: Once | SUBCUTANEOUS | Status: AC
  Administered 2019-05-01: 120 mg via SUBCUTANEOUS
  Filled 2019-05-01: qty 1.7

## 2019-05-01 NOTE — Patient Instructions (Signed)
Hunter Jones  Discharge Instructions:  Hunter Jones injection administered today. _______________________________________________________________  Thank you for choosing [place] at [facility] to provide your oncology and hematology care.  To afford each patient quality time with our providers, please arrive at least 15 minutes before your scheduled appointment.  You need to re-schedule your appointment if you arrive 10 or more minutes late.  We strive to give you quality time with our providers, and arriving late affects you and other patients whose appointments are after yours.  Also, if you no show three or more times for appointments you may be dismissed from the clinic.  Again, thank you for choosing [facility] at [facility] hope is that these requests will allow you access to exceptional care and in a timely manner. _______________________________________________________________  If you have questions after your visit, please contact our office at [phone] between the hours of 8:30 a.m. and 5:00 p.m. Voicemails left after 4:30 p.m. will not be returned until the following business day. _______________________________________________________________  For prescription refill requests, have your pharmacy contact our office. _______________________________________________________________  Recommendations made by the consultant and any test results will be sent to your referring physician. _______________________________________________________________

## 2019-05-01 NOTE — Progress Notes (Signed)
Phillips Climes presents today for denosumab injection. Lab work reviewed prior to administration. VSS. Pt reports taking Ca and Vit D as instructed. Pt denies tooth/jaw pain and denies recent or future invasive dental work. Injection tolerated well, see MAR for details. Site clean and dry, band aid applied. Pt discharged in satisfactory condition with follow up instructions.

## 2019-05-02 LAB — VITAMIN D 25 HYDROXY (VIT D DEFICIENCY, FRACTURES): Vit D, 25-Hydroxy: 32.1 ng/mL (ref 30–100)

## 2019-05-04 MED FILL — predniSONE 5 MG TABS: 5 | 30 days supply | Qty: 30 | Fill #1

## 2019-05-04 MED FILL — ABIRATERONE ACETATE 250 MG: 250 | 30 days supply | Qty: 90 | Fill #0

## 2019-05-23 ENCOUNTER — Other Ambulatory Visit: Payer: Self-pay

## 2019-05-23 ENCOUNTER — Ambulatory Visit (HOSPITAL_COMMUNITY)
Admission: RE | Admit: 2019-05-23 | Discharge: 2019-05-23 | Disposition: A | Discharge status: Home / Self Care | Payer: Medicare Other | Source: Ambulatory Visit | Attending: Nurse Practitioner | Admitting: Nurse Practitioner

## 2019-05-23 DIAGNOSIS — C61 Malignant neoplasm of prostate: Secondary | ICD-10-CM | POA: Diagnosis present

## 2019-05-23 MED ORDER — TECHNETIUM TC 99M MEDRONATE IV KIT
20.0000 | PACK | Freq: Once | INTRAVENOUS | Status: AC | PRN
  Administered 2019-05-23: 19.6 via INTRAVENOUS

## 2019-05-23 MED ORDER — IOHEXOL 300 MG/ML  SOLN
100.0000 mL | Freq: Once | INTRAMUSCULAR | Status: AC | PRN
  Administered 2019-05-23: 100 mL via INTRAVENOUS

## 2019-05-25 ENCOUNTER — Telehealth: Payer: Self-pay | Admitting: Cardiology

## 2019-05-25 ENCOUNTER — Other Ambulatory Visit: Payer: Self-pay

## 2019-05-25 ENCOUNTER — Encounter: Payer: Self-pay | Admitting: *Deleted

## 2019-05-25 ENCOUNTER — Encounter: Payer: Self-pay | Admitting: Cardiology

## 2019-05-25 ENCOUNTER — Ambulatory Visit (INDEPENDENT_AMBULATORY_CARE_PROVIDER_SITE_OTHER): Payer: Medicare Other | Admitting: Family Medicine

## 2019-05-25 VITALS — BP 104/67 | HR 85 | Ht 71.0 in | Wt 303.0 lb

## 2019-05-25 DIAGNOSIS — I10 Essential (primary) hypertension: Secondary | ICD-10-CM | POA: Diagnosis not present

## 2019-05-25 DIAGNOSIS — R600 Localized edema: Secondary | ICD-10-CM

## 2019-05-25 DIAGNOSIS — I25119 Atherosclerotic heart disease of native coronary artery with unspecified angina pectoris: Secondary | ICD-10-CM | POA: Diagnosis not present

## 2019-05-25 DIAGNOSIS — E782 Mixed hyperlipidemia: Secondary | ICD-10-CM

## 2019-05-25 NOTE — Progress Notes (Addendum)
Cardiology Office Note  Date: 05/25/2019   ID: Hunter Jones, DOB 06/25/1955, MRN [id number]  PCP:  Monico Blitz, MD  Cardiologist:  Rozann Lesches, MD Electrophysiologist:  None   Chief Complaint  Patient presents with  . Follow-up    CAD,hypertension, hyperlipidemia, lower extremity edema    History of Present Illness: Hunter Jones is a 63 y.o. male last encounter 11/27/2018 Tele-health with Dr. Domenic Polite.  History of coronary artery disease with DES stent to mid LAD August 2014 secondary to non-STEMI, COPD, diabetes, hypertension,, hyperthyroidism, prostate CA  (with metastatic disease).  During that visit he denied any progressive anginal symptoms or shortness of breath other than one episode of chest pressure without radiation or associated symptoms a few weeks ago. He denies any recurrence.  No complaints of palpitations or syncope during that visit.  Patient had a recent lower venous study to check for venous insufficiency. He has venous stasis changes in both lower extremities with trace edema. He is pending follow-up for results with primary care provider.  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  Recently had CT scan of his abdomen scan which shows stable diffuse bone metastasis. No evidence of soft tissue metastasis.  Patient is compliant with his cardiac regimen taking; aspirin, Cozaar, Toprol-XL, Lipitor.  Recent lab work from May 01, 2019 was reviewed today.  Past Medical History:  Diagnosis Date  . COPD (chronic obstructive pulmonary disease) ([place])   . Coronary artery disease    DES to mid LAD August 2014 ([facility])  . Diabetes mellitus without complication ([place])   . Essential hypertension   . Hyperthyroidism   . NSTEMI (non-ST elevated myocardial infarction) ([place]) 2014  . Prostate cancer [facility])     Past Surgical History:  Procedure  Laterality Date  . APPENDECTOMY    . LYMPHADENECTOMY Bilateral 06/27/2014   Procedure: LYMPHADENECTOMY;  Surgeon: Raynelle Bring, MD;  Location: [facility] ORS;  Service: Urology;  Laterality: Bilateral;  . ROBOT ASSISTED LAPAROSCOPIC RADICAL PROSTATECTOMY N/A 06/27/2014   Procedure: ROBOTIC ASSISTED LAPAROSCOPIC RADICAL PROSTATECTOMY LEVEL 3;  Surgeon: Raynelle Bring, MD;  Location: [facility];  Service: Urology;  Laterality: N/A;  . TONSILLECTOMY      Current Outpatient Medications  Medication Sig Dispense Refill  . abiraterone acetate (ZYTIGA) 250 MG tablet TAKE 3 TABLETS (750 MG TOTAL) BY MOUTH DAILY. TAKE ON AN EMPTY STOMACH 1 HOUR BEFORE OR 2 HOURS AFTER A MEAL 90 tablet 0  . albuterol (PROVENTIL HFA;VENTOLIN HFA) 108 (90 BASE) MCG/ACT inhaler Inhale 1-2 puffs into the lungs every 6 (six) hours as needed for wheezing or shortness of breath.    [name] Kitchen aspirin EC 81 MG tablet Take 81 mg by mouth every other day.     [name] Kitchen atorvastatin (LIPITOR) 40 MG tablet TAKE 1 TABLET BY MOUTH AT BEDTIME. 60 tablet 3  . Calcium-Magnesium-Vitamin D (CALCIUM 500 PO) Take 2 tablets by mouth daily. Per pt he takes 600mg  BID    . Co-Enzyme Q-10 100 MG CAPS Take 1 capsule by mouth daily.    . fluticasone-salmeterol (ADVAIR HFA) 115-21 MCG/ACT inhaler Inhale 2 puffs into the lungs 2 (two) times daily.     [name] Kitchen KRILL OIL PO Take 1 capsule by mouth daily.     [name] Kitchen Leuprolide Acetate, 3 Month, (LUPRON DEPOT, 81-MONTH, IM) Inject into the muscle every 3 (three) months.    [name] Kitchen losartan (  COZAAR) 25 MG tablet Take 25 mg by mouth daily.    . MEGESTROL ACETATE PO Take 20 mg by mouth daily as needed.     . methimazole (TAPAZOLE) 10 MG tablet Take 10 mg by mouth every other day.     . metoprolol succinate (TOPROL-XL) 50 MG 24 hr tablet Take 50 mg by mouth every morning. Take with or immediately following a meal.    . Multiple Vitamin (MULTIVITAMIN) tablet Take 1 tablet by mouth daily.    . Potassium 99 MG TABS Take 1 tablet by mouth daily.    .  predniSONE (DELTASONE) 5 MG tablet Take 1 tablet (5 mg total) by mouth daily with breakfast. 30 tablet 2   No current facility-administered medications for this visit.   Allergies:  Penicillins   Social History: The patient  reports that he quit smoking about 6 years ago. His smoking use included cigarettes. He quit after 40.00 years of use. He has never used smokeless tobacco. He reports current alcohol use. He reports that he does not use drugs.   Family History: The patient's family history includes Dementia in his mother; Kidney Stones in his father; Stroke in his brother; Thyroid disease in his mother.   ROS:  Please see the history of present illness. Otherwise, complete review of systems is positive for none.  All other systems are reviewed and negative.   Physical Exam: VS:  Ht 5\' 11"  (1.803 m)   Wt (!) 303 lb (137.4 kg)   BMI 42.26 kg/m , BMI Body mass index is 42.26 kg/m.  Wt Readings from Last 3 Encounters:  05/25/19 (!) 303 lb (137.4 kg)  02/06/19 (!) 301 lb 4 oz (136.6 kg)  12/12/18 297 lb 4.8 oz (134.9 kg)    General: Obese male  appears comfortable at rest. Neck: Supple, no elevated JVP or carotid bruits, no thyromegaly. Lungs: Clear to auscultation, nonlabored breathing at rest. Cardiac: Regular rate and rhythm, no S3 or significant systolic murmur, no pericardial rub. Extremities: Trace edema with venous stasis changes noted both lower extremities, distal pulses 2+. Skin: Warm and dry.. Neuropsychiatric: Alert and oriented x3, affect grossly appropriate.  ECG:  An ECG dated May 25, 2019 was personally reviewed today and demonstrated:  Normal sinus rhythm rate of 85.  Recent Labwork: 01/09/2019: Magnesium 2.0 05/01/2019: ALT 24; AST 22; BUN 32; Creatinine, Ser 1.23; Hemoglobin 14.4; Platelets 123; Potassium 4.3; Sodium 137  No results found for: CHOL, TRIG, HDL, CHOLHDL, VLDL, LDLCALC, LDLDIRECT  Other Studies Reviewed Today:  Stress echocardiogram  performed May 27, 2017    Cardiac catheterization performed February 04, 2013   CT of abdomen pelvis May 23, 2019 IMPRESSION: 1. Stable diffuse sclerotic bone metastases. 2. No evidence of soft tissue metastatic disease or other acute findings. 3. Tiny nonobstructing right renal calculus. 4. Colonic diverticulosis. No radiographic evidence of diverticulitis. Aortic Atherosclerosis (ICD10-I70.0).   Assessment and Plan:  1. Coronary artery disease involving native coronary artery of native heart with angina pectoris ([place])   2. Mixed hyperlipidemia   3. Essential hypertension   4. Bilateral lower extremity edema    1. Coronary artery disease involving native coronary artery of native heart with angina pectoris ([facility]) On aspirin 81 mg daily, atorvastatin 40 mg daily, Krill oral 1 capsule daily, Toprol-XL 50 mg daily . Patient is pending DOT certificate and renewal and states he needs a repeat stress test prior to obtaining his certificate. Schedule Lexiscan nuclear stress study prior to June 2021  2. Mixed hyperlipidemia Last LDL 28  03/2018.  On atorvastatin 40 mg daily.  3. Essential hypertension Blood pressure within normal limits today. Continue losartan 25 mg daily.  4. Bilateral lower extremity edema Advised low grade knee high compression stockings. Patient has venous stasis changes with pretibial evidence of hemosiderin deposits and redness with trace edema. He has a follow-up with PCP for discussion of results of recent lower venous study.  Medication Adjustments/Labs and Tests Ordered: Current medicines are reviewed at length with the patient today.  Concerns regarding medicines are outlined above.    There are no Patient Instructions on file for this visit.       Signed, Levell July, NP 05/25/2019 3:48 PM    [facility] at [place], [place],  [address] Phone: [phone]; Fax: [phone]

## 2019-05-25 NOTE — Patient Instructions (Signed)
Your physician recommends that you schedule a follow-up appointment in: [place]  Your physician recommends that you continue on your current medications as directed. Please refer to the Current Medication list given to you today.  Your physician has requested that you have a lexiscan myoview. For further information please visit [email or web address]. Please follow instruction sheet, as given.  Thank you for choosing [facility]!!

## 2019-05-25 NOTE — Telephone Encounter (Signed)
Pre-cert Verification for the following procedure    Lexiscan scheduled for 06/19/2019 at [facility]

## 2019-05-29 ENCOUNTER — Inpatient Hospital Stay (HOSPITAL_COMMUNITY): Payer: Medicare Other

## 2019-05-29 ENCOUNTER — Other Ambulatory Visit: Payer: Self-pay

## 2019-05-29 ENCOUNTER — Ambulatory Visit (HOSPITAL_COMMUNITY): Payer: BC Managed Care – PPO | Admitting: Nurse Practitioner

## 2019-05-29 ENCOUNTER — Inpatient Hospital Stay (HOSPITAL_BASED_OUTPATIENT_CLINIC_OR_DEPARTMENT_OTHER): Payer: Medicare Other | Admitting: Hematology

## 2019-05-29 ENCOUNTER — Inpatient Hospital Stay (HOSPITAL_COMMUNITY): Payer: Medicare Other | Attending: Hematology

## 2019-05-29 ENCOUNTER — Encounter (HOSPITAL_COMMUNITY): Payer: Self-pay | Admitting: Hematology

## 2019-05-29 DIAGNOSIS — C7951 Secondary malignant neoplasm of bone: Secondary | ICD-10-CM | POA: Insufficient documentation

## 2019-05-29 DIAGNOSIS — C61 Malignant neoplasm of prostate: Secondary | ICD-10-CM

## 2019-05-29 DIAGNOSIS — Z5111 Encounter for antineoplastic chemotherapy: Secondary | ICD-10-CM | POA: Diagnosis present

## 2019-05-29 LAB — CBC WITH DIFFERENTIAL/PLATELET
Abs Immature Granulocytes: 0.03 10*3/uL (ref 0.00–0.07)
Basophils Absolute: 0 10*3/uL (ref 0.0–0.1)
Basophils Relative: 1 %
Eosinophils Absolute: 0.2 10*3/uL (ref 0.0–0.5)
Eosinophils Relative: 3 %
HCT: 40.4 % (ref 39.0–52.0)
Hemoglobin: 13.9 g/dL (ref 13.0–17.0)
Immature Granulocytes: 0 %
Lymphocytes Relative: 28 %
Lymphs Abs: 2.2 10*3/uL (ref 0.7–4.0)
MCH: 35.2 pg — ABNORMAL HIGH (ref 26.0–34.0)
MCHC: 34.4 g/dL (ref 30.0–36.0)
MCV: 102.3 fL — ABNORMAL HIGH (ref 80.0–100.0)
Monocytes Absolute: 0.5 10*3/uL (ref 0.1–1.0)
Monocytes Relative: 6 %
Neutro Abs: 4.9 10*3/uL (ref 1.7–7.7)
Neutrophils Relative %: 62 %
Platelets: 143 10*3/uL — ABNORMAL LOW (ref 150–400)
RBC: 3.95 MIL/uL — ABNORMAL LOW (ref 4.22–5.81)
RDW: 13.5 % (ref 11.5–15.5)
WBC: 7.8 10*3/uL (ref 4.0–10.5)
nRBC: 0.3 % — ABNORMAL HIGH (ref 0.0–0.2)

## 2019-05-29 LAB — COMPREHENSIVE METABOLIC PANEL
ALT: 23 U/L (ref 0–44)
AST: 21 U/L (ref 15–41)
Albumin: 3.6 g/dL (ref 3.5–5.0)
Alkaline Phosphatase: 82 U/L (ref 38–126)
Anion gap: 8 (ref 5–15)
BUN: 32 mg/dL — ABNORMAL HIGH (ref 8–23)
CO2: 26 mmol/L (ref 22–32)
Calcium: 9.3 mg/dL (ref 8.9–10.3)
Chloride: 105 mmol/L (ref 98–111)
Creatinine, Ser: 1.38 mg/dL — ABNORMAL HIGH (ref 0.61–1.24)
GFR calc Af Amer: >60 mL/min (ref >60)
GFR calc non Af Amer: 53 mL/min — ABNORMAL LOW (ref >60)
Glucose, Bld: 256 mg/dL — ABNORMAL HIGH (ref 70–99)
Potassium: 4.9 mmol/L (ref 3.5–5.1)
Sodium: 139 mmol/L (ref 135–145)
Total Bilirubin: 0.8 mg/dL (ref 0.3–1.2)
Total Protein: 6.5 g/dL (ref 6.5–8.1)

## 2019-05-29 LAB — LACTATE DEHYDROGENASE: LDH: 196 U/L — ABNORMAL HIGH (ref 98–192)

## 2019-05-29 MED ORDER — ABIRATERONE ACETATE 250 MG PO TABS: 1000.0000 mg | ORAL_TABLET | Freq: Every day | ORAL | 1 refills | Status: DC

## 2019-05-29 MED ORDER — LEUPROLIDE ACETATE (3 MONTH) 22.5 MG ~~LOC~~ KIT
22.5000 mg | PACK | Freq: Once | SUBCUTANEOUS | Status: AC
  Administered 2019-05-29: 22.5 mg via SUBCUTANEOUS
  Filled 2019-05-29: qty 22.5

## 2019-05-29 MED ORDER — DENOSUMAB 120 MG/1.7ML ~~LOC~~ SOLN
120.0000 mg | Freq: Once | SUBCUTANEOUS | Status: AC
  Administered 2019-05-29: 120 mg via SUBCUTANEOUS
  Filled 2019-05-29: qty 1.7

## 2019-05-29 NOTE — Progress Notes (Signed)
Hunter Jones, [place] [id number]   CLINIC:  Medical Oncology/Hematology  PCP:  Monico Blitz, [place] [address] [phone]   REASON FOR VISIT: Follow-up for prostate Jones  CURRENT THERAPY: Zytiga and prednisone and Lupron injections  BRIEF ONCOLOGIC HISTORY:  Oncology History  Prostate Jones ([place])  02/06/2014 Procedure   Prostate biopsy by Dr. 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  Total Gleason score equals 7.  Proportion of prostatic tissue involved by tumor: 70%.   05/03/2014 Imaging   Bone scan- Negative    06/27/2014 Procedure   Radical resection of prostate by Dr. Raynelle Jones   07/29/2014 Pathology Results   1. Prostate, radical resection - PROSTATIC ADENOCARCINOMA, GLEASON SCORE 4 + 3 = 7. - RIGHT AND LEFT PROSTATE INVOLVED. - RIGHT AND LEFT SEMINAL VESICLES INVOLVED BY TUMOR. - EXTRACAPSULAR EXTENSION BY TUMOR. - TUMOR EXTENDS INTO BLADDER NECK TISSUE. - MARGINS NOT INVOLVED. 2. Lymph nodes, regional resection, left pelvic - THREE BENIGN LYMPH NODES (0/3). 3. Lymph nodes, regional resection, right pelvic - FOUR BENIGN LYMPH NODES (0/4).   11/16/2016 Imaging   Bone scan- New areas of increased uptake superolateral to the left orbit (faint), at S1, and in the posterolateral aspect of the left sixth rib.    11/25/2016 Imaging   CT CAP- 1. Status post radical prostatectomy. No findings to suggest local recurrence of disease or definite extraskeletal metastatic disease in the chest, abdomen or pelvis. However, there are several osseous lesions, as above, concerning for metastatic disease to the bones. 2. Hepatic steatosis. 3. Aortic atherosclerosis, in addition to 2 vessel coronary artery disease. Please note that although the presence of coronary artery  calcium documents the presence of coronary artery disease, the severity of this disease and any potential stenosis cannot be assessed on this non-gated CT examination. Assessment for potential risk factor modification, dietary therapy or pharmacologic therapy may be warranted, if clinically indicated. 4. Additional incidental findings, as above.      Jones STAGING: Jones Staging Prostate Jones [facility]) Staging form: Prostate, AJCC 7th Edition - Pathologic stage from 07/29/2014: Stage III (T3b, N0, cM0, Gleason 7) - Signed by Hunter Cancer, PA-C on 11/09/2016 - Clinical: Stage IV (T3, N0, M1, PSA: Less than 10, Gleason 7) - Signed by Hunter First, MD on 12/21/2016 - Pathologic: No stage assigned - Unsigned    INTERVAL HISTORY:  Hunter Jones 63 y.o. 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  He is currently taking Zytiga 3 tablets daily along with prednisone 5 mg daily.  Denies any new onset pains.  He reports sluggishness in the mornings.  Once he drinks his coffee, his energy levels improved.  Appetite is 100%.  Energy levels are 75%.  Shortness of breath on exertion is stable.  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  He reported increased swelling of his lower extremities since prednisone was started.  He has occasional loose bowel movements at times.    REVIEW OF SYSTEMS:  Review of Systems  Constitutional: Positive for fatigue.  Respiratory: Positive for shortness of breath.   All other systems reviewed and are negative.    PAST MEDICAL/SURGICAL HISTORY:  Past Medical History:  Diagnosis Date  . COPD (  chronic obstructive pulmonary disease) ([place])   . Coronary artery disease    DES to mid LAD August 2014 ([facility])  . Diabetes mellitus without complication ([place])   . Essential hypertension   . Hyperthyroidism   . NSTEMI (non-ST elevated myocardial infarction) ([place]) 2014   . Prostate Jones [facility])    Past Surgical History:  Procedure Laterality Date  . APPENDECTOMY    . LYMPHADENECTOMY Bilateral 06/27/2014   Procedure: LYMPHADENECTOMY;  Surgeon: Hunter Bring, MD;  Location: [facility];  Service: Urology;  Laterality: Bilateral;  . ROBOT ASSISTED LAPAROSCOPIC RADICAL PROSTATECTOMY N/A 06/27/2014   Procedure: ROBOTIC ASSISTED LAPAROSCOPIC RADICAL PROSTATECTOMY LEVEL 3;  Surgeon: Hunter Bring, MD;  Location: [facility];  Service: Urology;  Laterality: N/A;  . TONSILLECTOMY       SOCIAL HISTORY:  Social History   Socioeconomic History  . Marital status: Married    Spouse name: Not on file  . Number of children: Not on file  . Years of education: Not on file  . Highest education level: Not on file  Occupational History  . Not on file  Tobacco Use  . Smoking status: Former Smoker    Years: 40.00    Types: Cigarettes    Quit date: 06/21/2012    Years since quitting: 6.9  . Smokeless tobacco: Never Used  Substance and Sexual Activity  . Alcohol use: Yes    Comment: occasional  . Drug use: No  . Sexual activity: Not on file  Other Topics Concern  . Not on file  Social History Narrative  . Not on file   Social Determinants of Health   Financial Resource Strain:   . Difficulty of Paying Living Expenses: Not on file  Food Insecurity:   . Worried About Charity fundraiser in the Last Year: Not on file  . Ran Out of Food in the Last Year: Not on file  Transportation Needs:   . Lack of Transportation (Medical): Not on file  . Lack of Transportation (Non-Medical): Not on file  Physical Activity:   . Days of Exercise per Week: Not on file  . Minutes of Exercise per Session: Not on file  Stress:   . Feeling of Stress : Not on file  Social Connections:   . Frequency of Communication with Friends and Family: Not on file  . Frequency of Social Gatherings with Friends and Family: Not on file  . Attends Religious Services: Not on file  . Active Member of  Clubs or Organizations: Not on file  . Attends Archivist Meetings: Not on file  . Marital Status: Not on file  Intimate Partner Violence:   . Fear of Current or Ex-Partner: Not on file  . Emotionally Abused: Not on file  . Physically Abused: Not on file  . Sexually Abused: Not on file    FAMILY HISTORY:  Family History  Problem Relation Age of Onset  . Dementia Mother   . Thyroid disease Mother   . Kidney Stones Father   . Stroke Brother     CURRENT MEDICATIONS:  Outpatient Encounter Medications as of 05/29/2019  Medication Sig  . abiraterone acetate (ZYTIGA) 250 MG tablet Take 4 tablets (1,000 mg total) by mouth daily. Take on an empty stomach 1 hour before or 2 hours after a meal  . albuterol (PROVENTIL HFA;VENTOLIN HFA) 108 (90 BASE) MCG/ACT inhaler Inhale 1-2 puffs into the lungs every 6 (six) hours as needed for wheezing or shortness of breath.  [name] Kitchen aspirin  EC 81 MG tablet Take 81 mg by mouth every other day.   [name] Kitchen atorvastatin (LIPITOR) 40 MG tablet TAKE 1 TABLET BY MOUTH AT BEDTIME.  . Calcium-Magnesium-Vitamin D (CALCIUM 500 PO) Take 2 tablets by mouth daily. Per pt he takes 600mg  BID  . Co-Enzyme Q-10 100 MG CAPS Take 1 capsule by mouth daily.  [name] Kitchen denosumab (XGEVA) 120 MG/1.7ML SOLN injection Inject 120 mg into the skin every 30 (thirty) days.  . fluticasone-salmeterol (ADVAIR HFA) 115-21 MCG/ACT inhaler Inhale 2 puffs into the lungs 2 (two) times daily.   [name] Kitchen KRILL OIL PO Take 1 capsule by mouth daily.   [name] Kitchen Leuprolide Acetate, 3 Month, (LUPRON DEPOT, 35-MONTH, IM) Inject into the muscle every 3 (three) months.  [name] Kitchen losartan (COZAAR) 25 MG tablet Take 25 mg by mouth daily.  . MEGESTROL ACETATE PO Take 20 mg by mouth daily as needed.   . methimazole (TAPAZOLE) 10 MG tablet Take 10 mg by mouth every other day.   . metoprolol succinate (TOPROL-XL) 50 MG 24 hr tablet Take 50 mg by mouth every morning. Take with or immediately following a meal.  . Multiple Vitamin  (MULTIVITAMIN) tablet Take 1 tablet by mouth daily.  . Potassium 99 MG TABS Take 1 tablet by mouth daily.  . predniSONE (DELTASONE) 5 MG tablet Take 1 tablet (5 mg total) by mouth daily with breakfast.  . triamcinolone cream (KENALOG) 0.1 % Apply 1 application topically as needed.   . [DISCONTINUED] abiraterone acetate (ZYTIGA) 250 MG tablet TAKE 3 TABLETS (750 MG TOTAL) BY MOUTH DAILY. TAKE ON AN EMPTY STOMACH 1 HOUR BEFORE OR 2 HOURS AFTER A MEAL   No facility-administered encounter medications on file as of 05/29/2019.    ALLERGIES:  Allergies  Allergen Reactions  . Penicillins Hives     PHYSICAL EXAM:  ECOG Performance status: 1  Vitals:   05/29/19 1337  BP: 116/66  Pulse: 93  Resp: 16  Temp: (!) 97.3 F (36.3 C)  SpO2: 98%   Filed Weights   05/29/19 1337  Weight: (!) 302 lb 9.6 oz (137.3 kg)    Physical Exam Constitutional:      Appearance: Normal appearance. He is normal weight.  Cardiovascular:     Rate and Rhythm: Normal rate and regular rhythm.     Heart sounds: Normal heart sounds.  Pulmonary:     Effort: Pulmonary effort is normal.     Breath sounds: Normal breath sounds.  Abdominal:     General: Bowel sounds are normal. There is no distension.     Palpations: Abdomen is soft. There is no mass.  Musculoskeletal:        General: Normal range of motion.  Skin:    General: Skin is warm and dry.  Neurological:     Mental Status: He is alert and oriented to person, place, and time. Mental status is at baseline.  Psychiatric:        Mood and Affect: Mood normal.        Behavior: Behavior normal.        Thought Content: Thought content normal.        Judgment: Judgment normal.      LABORATORY DATA:  I have reviewed the labs as listed.  CBC    Component Value Date/Time   WBC 7.8 05/29/2019 1316   RBC 3.95 (L) 05/29/2019 1316   HGB 13.9 05/29/2019 1316   HCT 40.4 05/29/2019 1316   PLT 143 (L) 05/29/2019 1316   MCV 102.3 (  H) 05/29/2019 1316    MCH 35.2 (H) 05/29/2019 1316   MCHC 34.4 05/29/2019 1316   RDW 13.5 05/29/2019 1316   LYMPHSABS 2.2 05/29/2019 1316   MONOABS 0.5 05/29/2019 1316   EOSABS 0.2 05/29/2019 1316   BASOSABS 0.0 05/29/2019 1316   CMP Latest Ref Rng & Units 05/29/2019 05/01/2019 04/03/2019  Glucose 70 - 99 mg/dL 256(H) 199(H) 169(H)  BUN 8 - 23 mg/dL 32(H) 32(H) 31(H)  Creatinine 0.61 - 1.24 mg/dL 1.38(H) 1.23 1.27(H)  Sodium 135 - 145 mmol/L 139 137 138  Potassium 3.5 - 5.1 mmol/L 4.9 4.3 4.4  Chloride 98 - 111 mmol/L 105 106 107  CO2 22 - 32 mmol/L 26 21(L) 24  Calcium 8.9 - 10.3 mg/dL 9.3 9.0 8.8(L)  Total Protein 6.5 - 8.1 g/dL 6.5 6.3(L) 6.2(L)  Total Bilirubin 0.3 - 1.2 mg/dL 0.8 1.0 0.8  Alkaline Phos 38 - 126 U/L 82 78 71  AST 15 - 41 U/L 21 22 23   ALT 0 - 44 U/L 23 24 23     Radiology: -I have independently reviewed scans and discussed with the patient.   ASSESSMENT & PLAN:   Prostate Jones ([place]) 1. 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  Underwent prostatectomy by Dr. Merlene Pulling on 06/27/2014, stage IIIb (PT3PN0), Gleason 4+3 is equal to 7, underwent radiation. - Developed metastatic disease in June 2018.  He was started on Lupron and denosumab.  -Abiraterone 750 mg and prednisone 5 mg daily started on 06/16/2018. -Main side effect in his words "I feel sluggish in the mornings".  He takes pills anywhere between midnight and 2 AM in the morning. -Recently his PSA has gone up to 0.43 on 05/01/2019.  This was previously 0.22 on 02/06/2019 and 0.13 on 12/12/2018.  PSA prior to start of Zytiga was 1.56. -We reviewed the CT of the abdomen and pelvis on 05/23/2019 which did not show any evidence of metastatic disease in the viscera. -Bone scan on 05/23/2019 showed improved response compared to the prior bone scan prior to start of Abiraterone. -As there is no radiological progression, I have  recommended increasing Abiraterone to full dose.  He will take 4 tablets daily along with prednisone.  We will send in a new prescription. -I will see him back in 4 to 5 weeks to see how he is tolerating it.  I plan to repeat PSA at that time.  2. Bone metastasis: -He will continue denosumab monthly.  He will continue calcium and vitamin D supplements.       Orders placed this encounter:  Orders Placed This Encounter  Procedures  . CBC with Differential/Platelet  . Comprehensive metabolic panel  . PSA      Derek Jack, MD [place] [phone]

## 2019-05-29 NOTE — Progress Notes (Signed)
lupron and xgeva given today per orders. See MAR.  Treatment given per orders. Patient tolerated it well without problems. Vitals stable and discharged home from clinic ambulatory. Follow up as scheduled.

## 2019-05-29 NOTE — Assessment & Plan Note (Signed)
1. 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  Underwent prostatectomy by Dr. Merlene Pulling on 06/27/2014, stage IIIb (PT3PN0), Gleason 4+3 is equal to 7, underwent radiation. - Developed metastatic disease in June 2018.  He was started on Lupron and denosumab.  -Abiraterone 750 mg and prednisone 5 mg daily started on 06/16/2018. -Main side effect in his words "I feel sluggish in the mornings".  He takes pills anywhere between midnight and 2 AM in the morning. -Recently his PSA has gone up to 0.43 on 05/01/2019.  This was previously 0.22 on 02/06/2019 and 0.13 on 12/12/2018.  PSA prior to start of Zytiga was 1.56. -We reviewed the CT of the abdomen and pelvis on 05/23/2019 which did not show any evidence of metastatic disease in the viscera. -Bone scan on 05/23/2019 showed improved response compared to the prior bone scan prior to start of Abiraterone. -As there is no radiological progression, I have recommended increasing Abiraterone to full dose.  He will take 4 tablets daily along with prednisone.  We will send in a new prescription. -I will see him back in 4 to 5 weeks to see how he is tolerating it.  I plan to repeat PSA at that time.  2. Bone metastasis: -He will continue denosumab monthly.  He will continue calcium and vitamin D supplements.

## 2019-05-29 NOTE — Patient Instructions (Addendum)
[place] at [facility] Discharge Instructions  You were seen today by Dr. Delton Coombes. He went over your recent lab and scan results. Continue taking the Zytiga 4 tablets a day and Prednisone as directed. He will see you back in 5 weeks for labs and follow up.   Thank you for choosing [place] at [facility] to provide your oncology and hematology care.  To afford each patient quality time with our provider, please arrive at least 15 minutes before your scheduled appointment time.   If you have a lab appointment with the [facility] please come in thru the  Main Entrance and check in at the main information desk  You need to re-schedule your appointment should you arrive 10 or more minutes late.  We strive to give you quality time with our providers, and arriving late affects you and other patients whose appointments are after yours.  Also, if you no show three or more times for appointments you may be dismissed from the clinic at the providers discretion.     Again, thank you for choosing [facility].  Our hope is that these requests will decrease the amount of time that you wait before being seen by our physicians.       _____________________________________________________________  Should you have questions after your visit to [facility], please contact our office at [phone] between the hours of 8:00 a.m. and 4:30 p.m.  Voicemails left after 4:00 p.m. will not be returned until the following business day.  For prescription refill requests, have your pharmacy contact our office and allow 72 hours.    Cancer Center Support Programs:   > Cancer Support Group  2nd Tuesday of the month 1pm-2pm, Journey Room

## 2019-05-29 NOTE — Addendum Note (Signed)
Addended by: Laurine Blazer on: 05/29/2019 10:02 AM   Modules accepted: Orders

## 2019-05-31 MED FILL — predniSONE 5 MG TABS: 5 | 30 days supply | Qty: 30 | Fill #2

## 2019-05-31 MED FILL — ABIRATERONE ACETATE 250 MG: 250 | 30 days supply | Qty: 120 | Fill #0

## 2019-06-14 ENCOUNTER — Other Ambulatory Visit: Payer: Self-pay | Admitting: Cardiology

## 2019-06-19 ENCOUNTER — Encounter (HOSPITAL_COMMUNITY): Payer: Medicare Other

## 2019-06-19 ENCOUNTER — Other Ambulatory Visit: Payer: Self-pay

## 2019-06-19 ENCOUNTER — Ambulatory Visit (HOSPITAL_COMMUNITY)
Admission: RE | Admit: 2019-06-19 | Discharge: 2019-06-19 | Disposition: A | Discharge status: Home / Self Care | Payer: Medicare Other | Source: Ambulatory Visit | Attending: Cardiology | Admitting: Cardiology

## 2019-06-19 DIAGNOSIS — I25119 Atherosclerotic heart disease of native coronary artery with unspecified angina pectoris: Secondary | ICD-10-CM | POA: Insufficient documentation

## 2019-06-20 ENCOUNTER — Encounter (HOSPITAL_COMMUNITY): Payer: Medicare Other

## 2019-06-21 ENCOUNTER — Encounter (HOSPITAL_COMMUNITY)
Admission: RE | Admit: 2019-06-21 | Discharge: 2019-06-21 | Disposition: A | Discharge status: Home / Self Care | Payer: Medicare Other | Source: Ambulatory Visit | Attending: Cardiology | Admitting: Cardiology

## 2019-06-21 ENCOUNTER — Other Ambulatory Visit: Payer: Self-pay

## 2019-06-21 ENCOUNTER — Ambulatory Visit (HOSPITAL_COMMUNITY)
Admission: RE | Admit: 2019-06-21 | Discharge: 2019-06-21 | Disposition: A | Discharge status: Home / Self Care | Payer: Medicare Other | Source: Ambulatory Visit | Attending: Cardiology | Admitting: Cardiology

## 2019-06-21 ENCOUNTER — Encounter (HOSPITAL_COMMUNITY): Payer: Self-pay

## 2019-06-21 DIAGNOSIS — I25119 Atherosclerotic heart disease of native coronary artery with unspecified angina pectoris: Secondary | ICD-10-CM | POA: Insufficient documentation

## 2019-06-21 HISTORY — DX: Unspecified asthma, uncomplicated: J45.909

## 2019-06-21 LAB — NM MYOCAR MULTI W/SPECT W/WALL MOTION / EF
LV dias vol: 102 mL (ref 62–150)
LV sys vol: 37 mL
Peak HR: 81 {beats}/min
RATE: 0.43
Rest HR: 67 {beats}/min
SDS: 5
SRS: 0
SSS: 5
TID: 1.07

## 2019-06-21 MED ORDER — SODIUM CHLORIDE FLUSH 0.9 % IV SOLN
INTRAVENOUS | Status: AC
  Administered 2019-06-21: 10 mL via INTRAVENOUS
  Filled 2019-06-21: qty 10

## 2019-06-21 MED ORDER — TECHNETIUM TC 99M TETROFOSMIN IV KIT
10.0000 | PACK | Freq: Once | INTRAVENOUS | Status: AC | PRN
  Administered 2019-06-21: 11 via INTRAVENOUS

## 2019-06-21 MED ORDER — TECHNETIUM TC 99M TETROFOSMIN IV KIT
30.0000 | PACK | Freq: Once | INTRAVENOUS | Status: AC | PRN
  Administered 2019-06-21: 33 via INTRAVENOUS

## 2019-06-21 MED ORDER — REGADENOSON 0.4 MG/5ML IV SOLN
INTRAVENOUS | Status: AC
  Administered 2019-06-21: 0.4 mg via INTRAVENOUS
  Filled 2019-06-21: qty 5

## 2019-06-22 ENCOUNTER — Telehealth: Payer: Self-pay | Admitting: *Deleted

## 2019-06-22 ENCOUNTER — Encounter (HOSPITAL_COMMUNITY): Payer: Medicare Other

## 2019-06-22 ENCOUNTER — Ambulatory Visit (HOSPITAL_COMMUNITY): Payer: Medicare Other

## 2019-06-22 NOTE — Telephone Encounter (Signed)
-----   Message from Satira Sark, MD sent at 06/22/2019  7:39 AM EST ----- Results reviewed.  Test ordered by Mr. 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  Results are low risk and do not suggest significant progressive CAD particularly in the absence of progressive symptoms, LVEF normal.  We will continue medical therapy and routine scheduled follow-up.

## 2019-06-22 NOTE — Telephone Encounter (Signed)
Patient informed. Copy sent to PCP °

## 2019-06-26 ENCOUNTER — Other Ambulatory Visit (HOSPITAL_COMMUNITY): Payer: Self-pay | Admitting: Nurse Practitioner

## 2019-06-26 ENCOUNTER — Encounter (HOSPITAL_COMMUNITY): Payer: Self-pay

## 2019-06-26 ENCOUNTER — Inpatient Hospital Stay (HOSPITAL_COMMUNITY): Payer: Medicare Other | Attending: Hematology

## 2019-06-26 ENCOUNTER — Inpatient Hospital Stay (HOSPITAL_COMMUNITY): Payer: Medicare Other

## 2019-06-26 ENCOUNTER — Other Ambulatory Visit: Payer: Self-pay

## 2019-06-26 VITALS — BP 140/60 | HR 90 | Temp 97.1°F | Resp 16

## 2019-06-26 DIAGNOSIS — C61 Malignant neoplasm of prostate: Secondary | ICD-10-CM

## 2019-06-26 DIAGNOSIS — C7951 Secondary malignant neoplasm of bone: Secondary | ICD-10-CM

## 2019-06-26 LAB — CBC WITH DIFFERENTIAL/PLATELET
Abs Immature Granulocytes: 0.03 10*3/uL (ref 0.00–0.07)
Basophils Absolute: 0 10*3/uL (ref 0.0–0.1)
Basophils Relative: 0 %
Eosinophils Absolute: 0.2 10*3/uL (ref 0.0–0.5)
Eosinophils Relative: 3 %
HCT: 38.4 % — ABNORMAL LOW (ref 39.0–52.0)
Hemoglobin: 13.5 g/dL (ref 13.0–17.0)
Immature Granulocytes: 0 %
Lymphocytes Relative: 31 %
Lymphs Abs: 2 10*3/uL (ref 0.7–4.0)
MCH: 35.7 pg — ABNORMAL HIGH (ref 26.0–34.0)
MCHC: 35.2 g/dL (ref 30.0–36.0)
MCV: 101.6 fL — ABNORMAL HIGH (ref 80.0–100.0)
Monocytes Absolute: 0.4 10*3/uL (ref 0.1–1.0)
Monocytes Relative: 6 %
Neutro Abs: 3.9 10*3/uL (ref 1.7–7.7)
Neutrophils Relative %: 60 %
Platelets: 136 10*3/uL — ABNORMAL LOW (ref 150–400)
RBC: 3.78 MIL/uL — ABNORMAL LOW (ref 4.22–5.81)
RDW: 13.5 % (ref 11.5–15.5)
WBC: 6.7 10*3/uL (ref 4.0–10.5)
nRBC: 0 % (ref 0.0–0.2)

## 2019-06-26 LAB — COMPREHENSIVE METABOLIC PANEL
ALT: 21 U/L (ref 0–44)
AST: 20 U/L (ref 15–41)
Albumin: 3.6 g/dL (ref 3.5–5.0)
Alkaline Phosphatase: 77 U/L (ref 38–126)
Anion gap: 8 (ref 5–15)
BUN: 26 mg/dL — ABNORMAL HIGH (ref 8–23)
CO2: 26 mmol/L (ref 22–32)
Calcium: 9 mg/dL (ref 8.9–10.3)
Chloride: 103 mmol/L (ref 98–111)
Creatinine, Ser: 1.19 mg/dL (ref 0.61–1.24)
GFR calc Af Amer: >60 mL/min (ref >60)
GFR calc non Af Amer: >60 mL/min (ref >60)
Glucose, Bld: 277 mg/dL — ABNORMAL HIGH (ref 70–99)
Potassium: 4.4 mmol/L (ref 3.5–5.1)
Sodium: 137 mmol/L (ref 135–145)
Total Bilirubin: 0.7 mg/dL (ref 0.3–1.2)
Total Protein: 6.1 g/dL — ABNORMAL LOW (ref 6.5–8.1)

## 2019-06-26 LAB — PSA: Prostatic Specific Antigen: 0.46 ng/mL (ref 0.00–4.00)

## 2019-06-26 MED ORDER — DENOSUMAB 120 MG/1.7ML ~~LOC~~ SOLN: 120.0000 mg | Freq: Once | SUBCUTANEOUS | Status: DC

## 2019-06-26 MED FILL — predniSONE 5 MG TABS: 5 | 30 days supply | Qty: 30 | Fill #0

## 2019-06-26 NOTE — Progress Notes (Signed)
Patient decided to wait until office visit next week for Xgeva shot with oncology follow up visit.  Pharmacy notified.

## 2019-06-26 NOTE — Progress Notes (Signed)
06/26/19  Patient declined to wait on delayed labs for [facility], he will get next week.  Henreitta Leber, PharmD

## 2019-06-28 ENCOUNTER — Telehealth (HOSPITAL_COMMUNITY): Payer: Self-pay | Admitting: Pharmacy Technician

## 2019-06-28 MED FILL — ABIRATERONE ACETATE 250 MG: 250 | 30 days supply | Qty: 120 | Fill #1

## 2019-06-28 NOTE — Telephone Encounter (Signed)
Oral Oncology Patient Advocate Encounter  Was successful in securing patient a $8,000 grant from [employer] to provide copayment coverage for Zytiga.  This will keep the out of pocket expense at $0.     [employer] ID: [id number]  I have spoken with the patient.   The billing information is as follows and has been shared with Anna.    RxBin: [id number] PCN: [id number] Member ID: [id number] Group ID: [id number] Dates of Eligibility: 05/29/2019 through 05/27/2020  [place] Patient [place] Phone [phone] Fax [phone] 06/28/2019 10:01 AM

## 2019-07-03 ENCOUNTER — Encounter (HOSPITAL_COMMUNITY): Payer: Self-pay | Admitting: Hematology

## 2019-07-03 ENCOUNTER — Ambulatory Visit (HOSPITAL_COMMUNITY): Payer: Medicare Other

## 2019-07-03 ENCOUNTER — Other Ambulatory Visit: Payer: Self-pay

## 2019-07-03 ENCOUNTER — Inpatient Hospital Stay (HOSPITAL_BASED_OUTPATIENT_CLINIC_OR_DEPARTMENT_OTHER): Payer: Medicare Other | Admitting: Hematology

## 2019-07-03 ENCOUNTER — Inpatient Hospital Stay (HOSPITAL_COMMUNITY): Payer: Medicare Other

## 2019-07-03 VITALS — BP 132/48 | HR 89 | Temp 97.2°F | Resp 20 | Wt 300.0 lb

## 2019-07-03 DIAGNOSIS — C61 Malignant neoplasm of prostate: Secondary | ICD-10-CM

## 2019-07-03 DIAGNOSIS — C7951 Secondary malignant neoplasm of bone: Secondary | ICD-10-CM | POA: Diagnosis not present

## 2019-07-03 MED ORDER — DENOSUMAB 120 MG/1.7ML ~~LOC~~ SOLN
120.0000 mg | Freq: Once | SUBCUTANEOUS | Status: AC
  Administered 2019-07-03: 16:00:00 120 mg via SUBCUTANEOUS

## 2019-07-03 MED ORDER — DENOSUMAB 120 MG/1.7ML ~~LOC~~ SOLN
SUBCUTANEOUS | Status: AC
  Filled 2019-07-03: qty 1.7

## 2019-07-03 NOTE — Patient Instructions (Signed)
.  amtd

## 2019-07-03 NOTE — Assessment & Plan Note (Signed)
1. 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  Underwent prostatectomy by Dr. Merlene Pulling on 06/27/2014, stage IIIb (PT3PN0), Gleason 4+3 is equal to 7, underwent radiation. - Developed metastatic disease in June 2018.  He was started on Lupron and denosumab.  -Abiraterone 750 mg and prednisone 5 mg daily started on 06/16/2018. -Recently his PSA has gone up to 0.43 on 05/01/2019.  This was previously 0.22 on 02/06/2019 and 0.13 on 12/12/2018.  PSA prior to start of Zytiga was 1.56. -We reviewed the CT of the abdomen and pelvis on 05/23/2019 which did not show any evidence of metastatic disease in the viscera. -Bone scan on 05/23/2019 showed improved response compared to the prior bone scan prior to start of Abiraterone. -Abiraterone was increased to 4 tablets daily on 05/29/2019.  He has noticed more tiredness and he is more irritable since the dose was increased.  We reviewed labs from 06/26/2019.  Potassium is normal.  Blood pressure today is also normal. -His PSA was 0.46, previously 0.43.  This is more or less stable.  I have advised him to stay on 4 tablets daily of abiraterone.  He will continue prednisone 5 mg daily with it.  He will also continue Lupron every 3 months. -I plan to see him back in 2 months for follow-up with repeat PSA level.  He will continue monthly Xgeva.  2.  Bone metastasis: -He will continue monthly denosumab.  He will continue calcium and vitamin D supplements.

## 2019-07-03 NOTE — Progress Notes (Signed)
Patient tolerated injection with no complaints voiced.  Site clean and dry with no bruising or swelling noted at site.  Band aid applied.  Vss with discharge and left ambulatory with no s/s of distress noted.  

## 2019-07-03 NOTE — Progress Notes (Signed)
[address], [place] [address]   CLINIC:  Medical Oncology/Hematology  PCP:  Monico Blitz, [place] [address] [phone]   REASON FOR VISIT: Follow-up for prostate cancer  CURRENT THERAPY: Zytiga and prednisone and Lupron injections  BRIEF ONCOLOGIC HISTORY:  Oncology History  Prostate cancer ([place])  02/06/2014 Procedure   Prostate biopsy by Dr. Clyde Lundborg   02/11/2014 Pathology Results   Prostatic adenocarcinoma identified in 5 of 6 prostate specimens with Gleason pattern showing primary pattern-grade 4, secondary pattern grade 3.  Total Gleason score equals 7.  Proportion of prostatic tissue involved by tumor: 70%.   05/03/2014 Imaging   Bone scan- Negative    06/27/2014 Procedure   Radical resection of prostate by Dr. Raynelle Bring   07/29/2014 Pathology Results   1. Prostate, radical resection - PROSTATIC ADENOCARCINOMA, GLEASON SCORE 4 + 3 = 7. - RIGHT AND LEFT PROSTATE INVOLVED. - RIGHT AND LEFT SEMINAL VESICLES INVOLVED BY TUMOR. - EXTRACAPSULAR EXTENSION BY TUMOR. - TUMOR EXTENDS INTO BLADDER NECK TISSUE. - MARGINS NOT INVOLVED. 2. Lymph nodes, regional resection, left pelvic - THREE BENIGN LYMPH NODES (0/3). 3. Lymph nodes, regional resection, right pelvic - FOUR BENIGN LYMPH NODES (0/4).   11/16/2016 Imaging   Bone scan- New areas of increased uptake superolateral to the left orbit (faint), at S1, and in the posterolateral aspect of the left sixth rib.    11/25/2016 Imaging   CT CAP- 1. Status post radical prostatectomy. No findings to suggest local recurrence of disease or definite extraskeletal metastatic disease in the chest, abdomen or pelvis. However, there are several osseous lesions, as above, concerning for metastatic disease to the bones. 2. Hepatic steatosis. 3. Aortic atherosclerosis, in addition to 2 vessel coronary artery disease. Please note that although the presence of coronary  artery calcium documents the presence of coronary artery disease, the severity of this disease and any potential stenosis cannot be assessed on this non-gated CT examination. Assessment for potential risk factor modification, dietary therapy or pharmacologic therapy may be warranted, if clinically indicated. 4. Additional incidental findings, as above.      CANCER STAGING: Cancer Staging Prostate cancer [facility]) Staging form: Prostate, AJCC 7th Edition - Pathologic stage from 07/29/2014: Stage III (T3b, N0, cM0, Gleason 7) - Signed by Baird Cancer, PA-C on 11/09/2016 - Clinical: Stage IV (T3, N0, M1, PSA: Less than 10, Gleason 7) - Signed by [name], MD on 12/21/2016 - Pathologic: No stage assigned - Unsigned    INTERVAL HISTORY:  Hunter Jones 64 y.o. 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  He is currently taking Zytiga 3 tablets daily along with prednisone 5 mg daily.  Denies any new onset pains.  He reports sluggishness in the mornings.  Once he drinks his coffee, his energy levels improved.  Appetite is 100%.  Energy levels are 75%.  Shortness of breath on exertion is stable.  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  He reported increased swelling of his lower extremities since prednisone was started.  He has occasional loose bowel movements at times.    REVIEW OF SYSTEMS:  Review of Systems  Respiratory: Positive for shortness of breath.   Cardiovascular: Positive for leg swelling.  Gastrointestinal: Positive for diarrhea.  Psychiatric/Behavioral: Positive for sleep disturbance.  All other systems reviewed and are negative.    PAST  MEDICAL/SURGICAL HISTORY:  Past Medical History:  Diagnosis Date  . Asthma   . COPD (chronic obstructive pulmonary disease) ([place])   . Coronary artery disease    DES to mid LAD August 2014 ([facility])  . Diabetes mellitus without  complication ([place])   . Essential hypertension   . Hyperthyroidism   . NSTEMI (non-ST elevated myocardial infarction) ([place]) 2014  . Prostate cancer [facility])    Past Surgical History:  Procedure Laterality Date  . APPENDECTOMY    . LYMPHADENECTOMY Bilateral 06/27/2014   Procedure: LYMPHADENECTOMY;  Surgeon: Raynelle Bring, MD;  Location: [facility];  Service: Urology;  Laterality: Bilateral;  . ROBOT ASSISTED LAPAROSCOPIC RADICAL PROSTATECTOMY N/A 06/27/2014   Procedure: ROBOTIC ASSISTED LAPAROSCOPIC RADICAL PROSTATECTOMY LEVEL 3;  Surgeon: Raynelle Bring, MD;  Location: [facility];  Service: Urology;  Laterality: N/A;  . TONSILLECTOMY       SOCIAL HISTORY:  Social History   Socioeconomic History  . Marital status: Married    Spouse name: Not on file  . Number of children: Not on file  . Years of education: Not on file  . Highest education level: Not on file  Occupational History  . Not on file  Tobacco Use  . Smoking status: Former Smoker    Years: 40.00    Types: Cigarettes    Quit date: 06/21/2012    Years since quitting: 7.0  . Smokeless tobacco: Never Used  Substance and Sexual Activity  . Alcohol use: Yes    Comment: occasional  . Drug use: No  . Sexual activity: Not on file  Other Topics Concern  . Not on file  Social History Narrative  . Not on file   Social Determinants of Health   Financial Resource Strain:   . Difficulty of Paying Living Expenses: Not on file  Food Insecurity:   . Worried About Charity fundraiser in the Last Year: Not on file  . Ran Out of Food in the Last Year: Not on file  Transportation Needs:   . Lack of Transportation (Medical): Not on file  . Lack of Transportation (Non-Medical): Not on file  Physical Activity:   . Days of Exercise per Week: Not on file  . Minutes of Exercise per Session: Not on file  Stress:   . Feeling of Stress : Not on file  Social Connections:   . Frequency of Communication with Friends and Family: Not on file  .  Frequency of Social Gatherings with Friends and Family: Not on file  . Attends Religious Services: Not on file  . Active Member of Clubs or Organizations: Not on file  . Attends Archivist Meetings: Not on file  . Marital Status: Not on file  Intimate Partner Violence:   . Fear of Current or Ex-Partner: Not on file  . Emotionally Abused: Not on file  . Physically Abused: Not on file  . Sexually Abused: Not on file    FAMILY HISTORY:  Family History  Problem Relation Age of Onset  . Dementia Mother   . Thyroid disease Mother   . Kidney Stones Father   . Stroke Brother     CURRENT MEDICATIONS:  Outpatient Encounter Medications as of 07/03/2019  Medication Sig  . abiraterone acetate (ZYTIGA) 250 MG tablet Take 4 tablets (1,000 mg total) by mouth daily. Take on an empty stomach 1 hour before or 2 hours after a meal  . aspirin EC 81 MG tablet Take 81 mg by mouth every other day.   [name] Kitchen  atorvastatin (LIPITOR) 40 MG tablet TAKE 1 TABLET BY MOUTH AT BEDTIME.  . Calcium-Magnesium-Vitamin D (CALCIUM 500 PO) Take 2 tablets by mouth daily. Per pt he takes 600mg  BID  . Co-Enzyme Q-10 100 MG CAPS Take 1 capsule by mouth daily.  [name] Kitchen denosumab (XGEVA) 120 MG/1.7ML SOLN injection Inject 120 mg into the skin every 30 (thirty) days.  . fluticasone-salmeterol (ADVAIR HFA) 115-21 MCG/ACT inhaler Inhale 2 puffs into the lungs 2 (two) times daily.   [name] Kitchen KRILL OIL PO Take 1 capsule by mouth daily.   [name] Kitchen Leuprolide Acetate, 3 Month, (LUPRON DEPOT, 53-MONTH, IM) Inject into the muscle every 3 (three) months.  [name] Kitchen losartan (COZAAR) 25 MG tablet Take 25 mg by mouth daily.  . methimazole (TAPAZOLE) 10 MG tablet Take 10 mg by mouth every other day.   . metoprolol succinate (TOPROL-XL) 50 MG 24 hr tablet Take 50 mg by mouth every morning. Take with or immediately following a meal.  . Multiple Vitamin (MULTIVITAMIN) tablet Take 1 tablet by mouth daily.  . Potassium 99 MG TABS Take 1 tablet by mouth daily.  .  predniSONE (DELTASONE) 5 MG tablet TAKE 1 TABLET (5 MG TOTAL) BY MOUTH DAILY WITH BREAKFAST.  [name] Kitchen albuterol (PROVENTIL HFA;VENTOLIN HFA) 108 (90 BASE) MCG/ACT inhaler Inhale 1-2 puffs into the lungs every 6 (six) hours as needed for wheezing or shortness of breath.  . triamcinolone cream (KENALOG) 0.1 % Apply 1 application topically as needed.   . [DISCONTINUED] MEGESTROL ACETATE PO Take 20 mg by mouth daily as needed.    No facility-administered encounter medications on file as of 07/03/2019.    ALLERGIES:  Allergies  Allergen Reactions  . Penicillins Hives     PHYSICAL EXAM:  ECOG Performance status: 1  Vitals:   07/03/19 1543  BP: (!) 132/48  Pulse: 89  Resp: 20  Temp: (!) 97.2 F (36.2 C)  SpO2: 96%   Filed Weights   07/03/19 1543  Weight: 300 lb (136.1 kg)    Physical Exam Constitutional:      Appearance: Normal appearance. He is normal weight.  Cardiovascular:     Rate and Rhythm: Normal rate and regular rhythm.     Heart sounds: Normal heart sounds.  Pulmonary:     Effort: Pulmonary effort is normal.     Breath sounds: Normal breath sounds.  Abdominal:     General: Bowel sounds are normal. There is no distension.     Palpations: Abdomen is soft. There is no mass.  Musculoskeletal:        General: Normal range of motion.  Skin:    General: Skin is warm and dry.  Neurological:     Mental Status: He is alert and oriented to person, place, and time. Mental status is at baseline.  Psychiatric:        Mood and Affect: Mood normal.        Behavior: Behavior normal.        Thought Content: Thought content normal.        Judgment: Judgment normal.      LABORATORY DATA:  I have reviewed the labs as listed.  CBC    Component Value Date/Time   WBC 6.7 06/26/2019 1411   RBC 3.78 (L) 06/26/2019 1411   HGB 13.5 06/26/2019 1411   HCT 38.4 (L) 06/26/2019 1411   PLT 136 (L) 06/26/2019 1411   MCV 101.6 (H) 06/26/2019 1411   MCH 35.7 (H) 06/26/2019 1411   MCHC  35.2 06/26/2019 1411  RDW 13.5 06/26/2019 1411   LYMPHSABS 2.0 06/26/2019 1411   MONOABS 0.4 06/26/2019 1411   EOSABS 0.2 06/26/2019 1411   BASOSABS 0.0 06/26/2019 1411   CMP Latest Ref Rng & Units 06/26/2019 05/29/2019 05/01/2019  Glucose 70 - 99 mg/dL 277(H) 256(H) 199(H)  BUN 8 - 23 mg/dL 26(H) 32(H) 32(H)  Creatinine 0.61 - 1.24 mg/dL 1.19 1.38(H) 1.23  Sodium 135 - 145 mmol/L 137 139 137  Potassium 3.5 - 5.1 mmol/L 4.4 4.9 4.3  Chloride 98 - 111 mmol/L 103 105 106  CO2 22 - 32 mmol/L 26 26 21(L)  Calcium 8.9 - 10.3 mg/dL 9.0 9.3 9.0  Total Protein 6.5 - 8.1 g/dL 6.1(L) 6.5 6.3(L)  Total Bilirubin 0.3 - 1.2 mg/dL 0.7 0.8 1.0  Alkaline Phos 38 - 126 U/L 77 82 78  AST 15 - 41 U/L 20 21 22   ALT 0 - 44 U/L 21 23 24     Radiology: -I have independently reviewed scans and discussed with the patient.   ASSESSMENT & PLAN:   Prostate cancer ([place]) 1. 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  Underwent prostatectomy by Dr. Merlene Pulling on 06/27/2014, stage IIIb (PT3PN0), Gleason 4+3 is equal to 7, underwent radiation. - Developed metastatic disease in June 2018.  He was started on Lupron and denosumab.  -Abiraterone 750 mg and prednisone 5 mg daily started on 06/16/2018. -Recently his PSA has gone up to 0.43 on 05/01/2019.  This was previously 0.22 on 02/06/2019 and 0.13 on 12/12/2018.  PSA prior to start of Zytiga was 1.56. -We reviewed the CT of the abdomen and pelvis on 05/23/2019 which did not show any evidence of metastatic disease in the viscera. -Bone scan on 05/23/2019 showed improved response compared to the prior bone scan prior to start of Abiraterone. -Abiraterone was increased to 4 tablets daily on 05/29/2019.  He has noticed more tiredness and he is more irritable since the dose was increased.  We reviewed labs from 06/26/2019.  Potassium is normal.  Blood pressure today is  also normal. -His PSA was 0.46, previously 0.43.  This is more or less stable.  I have advised him to stay on 4 tablets daily of abiraterone.  He will continue prednisone 5 mg daily with it.  He will also continue Lupron every 3 months. -I plan to see him back in 2 months for follow-up with repeat PSA level.  He will continue monthly Xgeva.  2.  Bone metastasis: -He will continue monthly denosumab.  He will continue calcium and vitamin D supplements.       Orders placed this encounter:  Orders Placed This Encounter  Procedures  . CBC with Differential/Platelet  . Comprehensive metabolic panel  . PSA      Derek Jack, MD [place] [phone]

## 2019-07-17 ENCOUNTER — Other Ambulatory Visit: Payer: Self-pay

## 2019-07-17 ENCOUNTER — Ambulatory Visit: Payer: Medicare Other | Attending: Internal Medicine

## 2019-07-17 DIAGNOSIS — Z20822 Contact with and (suspected) exposure to covid-19: Secondary | ICD-10-CM

## 2019-07-18 LAB — NOVEL CORONAVIRUS, NAA: SARS-CoV-2, NAA: NOT DETECTED

## 2019-07-20 ENCOUNTER — Other Ambulatory Visit (HOSPITAL_COMMUNITY): Payer: Self-pay | Admitting: Hematology

## 2019-07-20 DIAGNOSIS — C61 Malignant neoplasm of prostate: Secondary | ICD-10-CM

## 2019-07-30 ENCOUNTER — Inpatient Hospital Stay (HOSPITAL_COMMUNITY): Payer: Medicare Other | Attending: Hematology

## 2019-07-30 ENCOUNTER — Other Ambulatory Visit: Payer: Self-pay

## 2019-07-30 DIAGNOSIS — C61 Malignant neoplasm of prostate: Secondary | ICD-10-CM | POA: Diagnosis present

## 2019-07-30 DIAGNOSIS — C7951 Secondary malignant neoplasm of bone: Secondary | ICD-10-CM | POA: Insufficient documentation

## 2019-07-30 LAB — CBC WITH DIFFERENTIAL/PLATELET
Abs Immature Granulocytes: 0.02 10*3/uL (ref 0.00–0.07)
Basophils Absolute: 0 10*3/uL (ref 0.0–0.1)
Basophils Relative: 1 %
Eosinophils Absolute: 0.3 10*3/uL (ref 0.0–0.5)
Eosinophils Relative: 4 %
HCT: 40 % (ref 39.0–52.0)
Hemoglobin: 13.9 g/dL (ref 13.0–17.0)
Immature Granulocytes: 0 %
Lymphocytes Relative: 28 %
Lymphs Abs: 1.9 10*3/uL (ref 0.7–4.0)
MCH: 35.3 pg — ABNORMAL HIGH (ref 26.0–34.0)
MCHC: 34.8 g/dL (ref 30.0–36.0)
MCV: 101.5 fL — ABNORMAL HIGH (ref 80.0–100.0)
Monocytes Absolute: 0.5 10*3/uL (ref 0.1–1.0)
Monocytes Relative: 7 %
Neutro Abs: 4.2 10*3/uL (ref 1.7–7.7)
Neutrophils Relative %: 60 %
Platelets: 148 10*3/uL — ABNORMAL LOW (ref 150–400)
RBC: 3.94 MIL/uL — ABNORMAL LOW (ref 4.22–5.81)
RDW: 12.9 % (ref 11.5–15.5)
WBC: 6.9 10*3/uL (ref 4.0–10.5)
nRBC: 0 % (ref 0.0–0.2)

## 2019-07-30 LAB — COMPREHENSIVE METABOLIC PANEL
ALT: 19 U/L (ref 0–44)
AST: 19 U/L (ref 15–41)
Albumin: 3.5 g/dL (ref 3.5–5.0)
Alkaline Phosphatase: 85 U/L (ref 38–126)
Anion gap: 9 (ref 5–15)
BUN: 27 mg/dL — ABNORMAL HIGH (ref 8–23)
CO2: 26 mmol/L (ref 22–32)
Calcium: 9 mg/dL (ref 8.9–10.3)
Chloride: 106 mmol/L (ref 98–111)
Creatinine, Ser: 1.18 mg/dL (ref 0.61–1.24)
GFR calc Af Amer: >60 mL/min (ref >60)
GFR calc non Af Amer: >60 mL/min (ref >60)
Glucose, Bld: 215 mg/dL — ABNORMAL HIGH (ref 70–99)
Potassium: 4.7 mmol/L (ref 3.5–5.1)
Sodium: 141 mmol/L (ref 135–145)
Total Bilirubin: 0.9 mg/dL (ref 0.3–1.2)
Total Protein: 6.3 g/dL — ABNORMAL LOW (ref 6.5–8.1)

## 2019-07-30 MED FILL — predniSONE 5 MG TABS: 5 | 30 days supply | Qty: 30 | Fill #1

## 2019-07-30 MED FILL — ABIRATERONE ACETATE 250 MG: 250 | 30 days supply | Qty: 120 | Fill #0

## 2019-07-31 ENCOUNTER — Inpatient Hospital Stay (HOSPITAL_COMMUNITY): Payer: Medicare Other

## 2019-07-31 ENCOUNTER — Other Ambulatory Visit: Payer: Self-pay

## 2019-07-31 ENCOUNTER — Encounter (HOSPITAL_COMMUNITY): Payer: Self-pay

## 2019-07-31 ENCOUNTER — Other Ambulatory Visit (HOSPITAL_COMMUNITY): Payer: Medicare Other

## 2019-07-31 VITALS — BP 135/54 | HR 90 | Temp 97.2°F | Resp 20

## 2019-07-31 DIAGNOSIS — C7951 Secondary malignant neoplasm of bone: Secondary | ICD-10-CM | POA: Diagnosis not present

## 2019-07-31 DIAGNOSIS — C61 Malignant neoplasm of prostate: Secondary | ICD-10-CM

## 2019-07-31 MED ORDER — DENOSUMAB 120 MG/1.7ML ~~LOC~~ SOLN
120.0000 mg | Freq: Once | SUBCUTANEOUS | Status: AC
  Administered 2019-07-31: 120 mg via SUBCUTANEOUS
  Filled 2019-07-31: qty 1.7

## 2019-07-31 NOTE — Progress Notes (Signed)
Patient taking calcium as directed.  Denied tooth, jaw, and leg pain.  No recent or upcoming dental visits.  Patient tolerated injection with no complaints voiced.  Site clean and dry with no bruising or swelling noted at site.  Band aid applied.  Vss with discharge and left ambulatory with no s/s of distress noted.  

## 2019-08-12 ENCOUNTER — Ambulatory Visit: Payer: Medicare Other | Attending: Internal Medicine

## 2019-08-12 DIAGNOSIS — Z23 Encounter for immunization: Secondary | ICD-10-CM | POA: Insufficient documentation

## 2019-08-12 NOTE — Progress Notes (Signed)
   Covid-19 Vaccination Clinic  Name:  Hunter Jones    MRN: [id number] DOB: 28-Feb-1956  08/12/2019  Hunter Jones was observed post Covid-19 immunization for 15 minutes without incidence. He was provided with Vaccine Information Sheet and instruction to access the V-Safe system.   Hunter Jones was instructed to call 911 with any severe reactions post vaccine: [name] Kitchen Difficulty breathing  . Swelling of your face and throat  . A fast heartbeat  . A bad rash all over your body  . Dizziness and weakness    Immunizations Administered    Name Date Dose VIS Date Route   Pfizer COVID-19 Vaccine 08/12/2019 10:32 AM 0.3 mL 05/25/2019 Intramuscular   Manufacturer: [place]   Lot: [id number]   [place]: [id number]

## 2019-08-27 ENCOUNTER — Other Ambulatory Visit: Payer: Self-pay

## 2019-08-27 ENCOUNTER — Inpatient Hospital Stay (HOSPITAL_COMMUNITY): Payer: Medicare Other | Attending: Hematology

## 2019-08-27 DIAGNOSIS — C61 Malignant neoplasm of prostate: Secondary | ICD-10-CM | POA: Insufficient documentation

## 2019-08-27 DIAGNOSIS — Z5111 Encounter for antineoplastic chemotherapy: Secondary | ICD-10-CM | POA: Insufficient documentation

## 2019-08-27 DIAGNOSIS — C7951 Secondary malignant neoplasm of bone: Secondary | ICD-10-CM | POA: Insufficient documentation

## 2019-08-27 LAB — COMPREHENSIVE METABOLIC PANEL
ALT: 20 U/L (ref 0–44)
AST: 21 U/L (ref 15–41)
Albumin: 3.6 g/dL (ref 3.5–5.0)
Alkaline Phosphatase: 88 U/L (ref 38–126)
Anion gap: 8 (ref 5–15)
BUN: 29 mg/dL — ABNORMAL HIGH (ref 8–23)
CO2: 25 mmol/L (ref 22–32)
Calcium: 9.1 mg/dL (ref 8.9–10.3)
Chloride: 106 mmol/L (ref 98–111)
Creatinine, Ser: 1.31 mg/dL — ABNORMAL HIGH (ref 0.61–1.24)
GFR calc Af Amer: >60 mL/min (ref >60)
GFR calc non Af Amer: 57 mL/min — ABNORMAL LOW (ref >60)
Glucose, Bld: 203 mg/dL — ABNORMAL HIGH (ref 70–99)
Potassium: 4.5 mmol/L (ref 3.5–5.1)
Sodium: 139 mmol/L (ref 135–145)
Total Bilirubin: 0.7 mg/dL (ref 0.3–1.2)
Total Protein: 6.2 g/dL — ABNORMAL LOW (ref 6.5–8.1)

## 2019-08-27 LAB — CBC WITH DIFFERENTIAL/PLATELET
Abs Immature Granulocytes: 0.01 10*3/uL (ref 0.00–0.07)
Basophils Absolute: 0 10*3/uL (ref 0.0–0.1)
Basophils Relative: 0 %
Eosinophils Absolute: 0.2 10*3/uL (ref 0.0–0.5)
Eosinophils Relative: 3 %
HCT: 39.7 % (ref 39.0–52.0)
Hemoglobin: 13.9 g/dL (ref 13.0–17.0)
Immature Granulocytes: 0 %
Lymphocytes Relative: 26 %
Lymphs Abs: 1.7 10*3/uL (ref 0.7–4.0)
MCH: 35 pg — ABNORMAL HIGH (ref 26.0–34.0)
MCHC: 35 g/dL (ref 30.0–36.0)
MCV: 100 fL (ref 80.0–100.0)
Monocytes Absolute: 0.5 10*3/uL (ref 0.1–1.0)
Monocytes Relative: 7 %
Neutro Abs: 4.2 10*3/uL (ref 1.7–7.7)
Neutrophils Relative %: 64 %
Platelets: 145 10*3/uL — ABNORMAL LOW (ref 150–400)
RBC: 3.97 MIL/uL — ABNORMAL LOW (ref 4.22–5.81)
RDW: 12.6 % (ref 11.5–15.5)
WBC: 6.5 10*3/uL (ref 4.0–10.5)
nRBC: 0 % (ref 0.0–0.2)

## 2019-08-27 LAB — PSA: Prostatic Specific Antigen: 0.82 ng/mL (ref 0.00–4.00)

## 2019-08-29 ENCOUNTER — Encounter (HOSPITAL_COMMUNITY): Payer: Self-pay | Admitting: Hematology

## 2019-08-29 ENCOUNTER — Inpatient Hospital Stay (HOSPITAL_COMMUNITY): Payer: Medicare Other

## 2019-08-29 ENCOUNTER — Other Ambulatory Visit: Payer: Self-pay

## 2019-08-29 ENCOUNTER — Inpatient Hospital Stay (HOSPITAL_BASED_OUTPATIENT_CLINIC_OR_DEPARTMENT_OTHER): Payer: Medicare Other | Admitting: Hematology

## 2019-08-29 VITALS — BP 125/57 | HR 82 | Temp 96.5°F | Resp 18 | Wt 299.3 lb

## 2019-08-29 DIAGNOSIS — C61 Malignant neoplasm of prostate: Secondary | ICD-10-CM | POA: Diagnosis not present

## 2019-08-29 DIAGNOSIS — Z5111 Encounter for antineoplastic chemotherapy: Secondary | ICD-10-CM | POA: Diagnosis not present

## 2019-08-29 MED ORDER — LEUPROLIDE ACETATE (3 MONTH) 22.5 MG ~~LOC~~ KIT
22.5000 mg | PACK | Freq: Once | SUBCUTANEOUS | Status: AC
  Administered 2019-08-29: 22.5 mg via SUBCUTANEOUS
  Filled 2019-08-29: qty 22.5

## 2019-08-29 MED ORDER — DENOSUMAB 120 MG/1.7ML ~~LOC~~ SOLN
120.0000 mg | Freq: Once | SUBCUTANEOUS | Status: AC
  Administered 2019-08-29: 120 mg via SUBCUTANEOUS
  Filled 2019-08-29: qty 1.7

## 2019-08-29 NOTE — Progress Notes (Signed)
Patient tolerated Eligard and Xgeva injection with no complaints voiced.  Sites clean and dry with no bruising or swelling noted at site.  Band aids applied.  Vss with discharge and left ambulatory with no s/s of distress noted.

## 2019-08-29 NOTE — Patient Instructions (Signed)
[place] at [facility] Discharge Instructions  You were seen today by Dr. Delton Coombes. He went over your recent lab results. Continue your cancer pills as directed. Continue injections. We will continue to monitor your cancer counts. He will see you back in 2 months for labs and follow up.   Thank you for choosing [place] at [facility] to provide your oncology and hematology care.  To afford each patient quality time with our provider, please arrive at least 15 minutes before your scheduled appointment time.   If you have a lab appointment with the [place] please come in thru the  Main Entrance and check in at the main information desk  You need to re-schedule your appointment should you arrive 10 or more minutes late.  We strive to give you quality time with our providers, and arriving late affects you and other patients whose appointments are after yours.  Also, if you no show three or more times for appointments you may be dismissed from the clinic at the providers discretion.     Again, thank you for choosing [facility].  Our hope is that these requests will decrease the amount of time that you wait before being seen by our physicians.       _____________________________________________________________  Should you have questions after your visit to [facility], please contact our office at [phone] between the hours of 8:00 a.m. and 4:30 p.m.  Voicemails left after 4:00 p.m. will not be returned until the following business day.  For prescription refill requests, have your pharmacy contact our office and allow 72 hours.    Cancer Center Support Programs:   > Cancer Support Group  2nd Tuesday of the month 1pm-2pm, Journey Room

## 2019-08-29 NOTE — Assessment & Plan Note (Addendum)
1.  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 -Zytiga 750 mg and prednisone 5 mg started on 06/16/2018. -CTAP and a bone scan from 05/23/2019 shows improved response compared to bone scan from prior to start of abiraterone. -Abiraterone dose increased to 4 tablets daily in the first week of January 2021 as PSA was trending up. -We reviewed labs today.  PSA is gone to 0.82.  This was 0.46 on 06/26/2019. -He is taking Zytiga on empty stomach.  He sometimes misses prednisone because of leg swelling.  I have told him to take prednisone 2.5 mg on the days of leg swellings. -He will proceed with his Eligard 22.5 mg today. -As his most recent scans showed very good response in December, I would not make any changes at this time. -We will plan to repeat PSA in 2 months.  If it goes above 1, we will consider repeating scans. -Nonchemo option on progression includes enzalutamide.  2.  Bone metastasis: -He will continue denosumab.  He will continue calcium and vitamin D supplements.

## 2019-08-29 NOTE — Progress Notes (Signed)
[place], [place] [address]   CLINIC:  Medical Oncology/Hematology  PCP:  Monico Blitz, [place] [address] [phone]   REASON FOR VISIT: Follow-up for prostate cancer  CURRENT THERAPY: Zytiga and prednisone and Lupron injections  BRIEF ONCOLOGIC HISTORY:  Oncology History  Prostate cancer ([place])  02/06/2014 Procedure   Prostate biopsy by Dr. Clyde Lundborg   02/11/2014 Pathology Results   Prostatic adenocarcinoma identified in 5 of 6 prostate specimens with Gleason pattern showing primary pattern-grade 4, secondary pattern grade 3.  Total Gleason score equals 7.  Proportion of prostatic tissue involved by tumor: 70%.   05/03/2014 Imaging   Bone scan- Negative    06/27/2014 Procedure   Radical resection of prostate by Dr. Raynelle Bring   07/29/2014 Pathology Results   1. Prostate, radical resection - PROSTATIC ADENOCARCINOMA, GLEASON SCORE 4 + 3 = 7. - RIGHT AND LEFT PROSTATE INVOLVED. - RIGHT AND LEFT SEMINAL VESICLES INVOLVED BY TUMOR. - EXTRACAPSULAR EXTENSION BY TUMOR. - TUMOR EXTENDS INTO BLADDER NECK TISSUE. - MARGINS NOT INVOLVED. 2. Lymph nodes, regional resection, left pelvic - THREE BENIGN LYMPH NODES (0/3). 3. Lymph nodes, regional resection, right pelvic - FOUR BENIGN LYMPH NODES (0/4).   11/16/2016 Imaging   Bone scan- New areas of increased uptake superolateral to the left orbit (faint), at S1, and in the posterolateral aspect of the left sixth rib.    11/25/2016 Imaging   CT CAP- 1. Status post radical prostatectomy. No findings to suggest local recurrence of disease or definite extraskeletal metastatic disease in the chest, abdomen or pelvis. However, there are several osseous lesions, as above, concerning for metastatic disease to the bones. 2. Hepatic steatosis. 3. Aortic atherosclerosis, in addition to 2 vessel coronary artery disease. Please note that although the presence of coronary  artery calcium documents the presence of coronary artery disease, the severity of this disease and any potential stenosis cannot be assessed on this non-gated CT examination. Assessment for potential risk factor modification, dietary therapy or pharmacologic therapy may be warranted, if clinically indicated. 4. Additional incidental findings, as above.      CANCER STAGING: Cancer Staging Prostate cancer [facility]) Staging form: Prostate, AJCC 7th Edition - Pathologic stage from 07/29/2014: Stage III (T3b, N0, cM0, Gleason 7) - Signed by Baird Cancer, PA-C on 11/09/2016 - Clinical: Stage IV (T3, N0, M1, PSA: Less than 10, Gleason 7) - Signed by [name], MD on 12/21/2016 - Pathologic: No stage assigned - Unsigned    INTERVAL HISTORY:  Mr. Capano 64 y.o. 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  He is taking Zytiga 4 tablets daily.  He takes prednisone 5 mg and does not take it when his legs swell up.  Blood pressure is stable today.  Appetite is 100%.  Energy levels are 50%.  Reports some aches in the muscles.    REVIEW OF SYSTEMS:  Review of Systems  Respiratory: Positive for shortness of breath.   Cardiovascular: Positive for leg swelling.  All other systems reviewed and are negative.    PAST MEDICAL/SURGICAL HISTORY:  Past Medical History:  Diagnosis Date  . Asthma   . COPD (chronic obstructive pulmonary disease) ([place])   . Coronary artery disease    DES to mid LAD August 2014 ([facility])  . Diabetes mellitus without complication ([place])   . Essential hypertension   . Hyperthyroidism   . NSTEMI (non-ST elevated myocardial infarction) ([place]) 2014  .  Prostate cancer [facility])    Past Surgical History:  Procedure Laterality Date  . APPENDECTOMY    . LYMPHADENECTOMY Bilateral 06/27/2014   Procedure: LYMPHADENECTOMY;  Surgeon: Raynelle Bring, MD;  Location: [facility];  Service: Urology;  Laterality: Bilateral;  . ROBOT ASSISTED LAPAROSCOPIC RADICAL PROSTATECTOMY N/A  06/27/2014   Procedure: ROBOTIC ASSISTED LAPAROSCOPIC RADICAL PROSTATECTOMY LEVEL 3;  Surgeon: Raynelle Bring, MD;  Location: [facility];  Service: Urology;  Laterality: N/A;  . TONSILLECTOMY       SOCIAL HISTORY:  Social History   Socioeconomic History  . Marital status: Married    Spouse name: Not on file  . Number of children: Not on file  . Years of education: Not on file  . Highest education level: Not on file  Occupational History  . Not on file  Tobacco Use  . Smoking status: Former Smoker    Years: 40.00    Types: Cigarettes    Quit date: 06/21/2012    Years since quitting: 7.1  . Smokeless tobacco: Never Used  Substance and Sexual Activity  . Alcohol use: Yes    Comment: occasional  . Drug use: No  . Sexual activity: Not on file  Other Topics Concern  . Not on file  Social History Narrative  . Not on file   Social Determinants of Health   Financial Resource Strain:   . Difficulty of Paying Living Expenses:   Food Insecurity:   . Worried About Charity fundraiser in the Last Year:   . Arboriculturist in the Last Year:   Transportation Needs:   . Film/video editor (Medical):   [name] Kitchen Lack of Transportation (Non-Medical):   Physical Activity:   . Days of Exercise per Week:   . Minutes of Exercise per Session:   Stress:   . Feeling of Stress :   Social Connections:   . Frequency of Communication with Friends and Family:   . Frequency of Social Gatherings with Friends and Family:   . Attends Religious Services:   . Active Member of Clubs or Organizations:   . Attends Archivist Meetings:   [name] Kitchen Marital Status:   Intimate Partner Violence:   . Fear of Current or Ex-Partner:   . Emotionally Abused:   [name] Kitchen Physically Abused:   . Sexually Abused:     FAMILY HISTORY:  Family History  Problem Relation Age of Onset  . Dementia Mother   . Thyroid disease Mother   . Kidney Stones Father   . Stroke Brother     CURRENT MEDICATIONS:  Outpatient Encounter  Medications as of 08/29/2019  Medication Sig  . abiraterone acetate (ZYTIGA) 250 MG tablet TAKE 4 TABLETS (1,000 MG TOTAL) BY MOUTH DAILY. TAKE ON AN EMPTY STOMACH 1 HOUR BEFORE OR 2 HOURS AFTER A MEAL  . aspirin EC 81 MG tablet Take 81 mg by mouth every other day.   [name] Kitchen atorvastatin (LIPITOR) 40 MG tablet TAKE 1 TABLET BY MOUTH AT BEDTIME.  . Calcium-Magnesium-Vitamin D (CALCIUM 500 PO) Take 2 tablets by mouth daily. Per pt he takes 600mg  BID  . Co-Enzyme Q-10 100 MG CAPS Take 1 capsule by mouth daily.  [name] Kitchen denosumab (XGEVA) 120 MG/1.7ML SOLN injection Inject 120 mg into the skin every 30 (thirty) days.  . fluticasone-salmeterol (ADVAIR HFA) 115-21 MCG/ACT inhaler Inhale 2 puffs into the lungs 2 (two) times daily.   [name] Kitchen KRILL OIL PO Take 1 capsule by mouth daily.   [name] Kitchen Leuprolide Acetate, 3 Month, (  LUPRON DEPOT, 6-MONTH, IM) Inject into the muscle every 3 (three) months.  [name] Kitchen losartan (COZAAR) 25 MG tablet Take 25 mg by mouth daily.  . methimazole (TAPAZOLE) 10 MG tablet Take 10 mg by mouth every other day.   . metoprolol succinate (TOPROL-XL) 50 MG 24 hr tablet Take 50 mg by mouth every morning. Take with or immediately following a meal.  . Multiple Vitamin (MULTIVITAMIN) tablet Take 1 tablet by mouth daily.  . Potassium 99 MG TABS Take 1 tablet by mouth daily.  . predniSONE (DELTASONE) 5 MG tablet TAKE 1 TABLET (5 MG TOTAL) BY MOUTH DAILY WITH BREAKFAST.  [name] Kitchen albuterol (PROVENTIL HFA;VENTOLIN HFA) 108 (90 BASE) MCG/ACT inhaler Inhale 1-2 puffs into the lungs every 6 (six) hours as needed for wheezing or shortness of breath.  . triamcinolone cream (KENALOG) 0.1 % Apply 1 application topically as needed.    No facility-administered encounter medications on file as of 08/29/2019.    ALLERGIES:  Allergies  Allergen Reactions  . Penicillins Hives     PHYSICAL EXAM:  ECOG Performance status: 1  Vitals:   08/29/19 1504  BP: (!) 125/57  Pulse: 82  Resp: 18  Temp: (!) 96.5 F (35.8 C)  SpO2: 96%     Filed Weights   08/29/19 1504  Weight: 299 lb 4.8 oz (135.8 kg)    Physical Exam Constitutional:      Appearance: Normal appearance. He is normal weight.  Cardiovascular:     Rate and Rhythm: Normal rate and regular rhythm.     Heart sounds: Normal heart sounds.  Pulmonary:     Effort: Pulmonary effort is normal.     Breath sounds: Normal breath sounds.  Abdominal:     General: Bowel sounds are normal. There is no distension.     Palpations: Abdomen is soft. There is no mass.  Musculoskeletal:        General: Normal range of motion.  Skin:    General: Skin is warm and dry.  Neurological:     Mental Status: He is alert and oriented to person, place, and time. Mental status is at baseline.  Psychiatric:        Mood and Affect: Mood normal.        Behavior: Behavior normal.        Thought Content: Thought content normal.        Judgment: Judgment normal.      LABORATORY DATA:  I have reviewed the labs as listed.  CBC    Component Value Date/Time   WBC 6.5 08/27/2019 1241   RBC 3.97 (L) 08/27/2019 1241   HGB 13.9 08/27/2019 1241   HCT 39.7 08/27/2019 1241   PLT 145 (L) 08/27/2019 1241   MCV 100.0 08/27/2019 1241   MCH 35.0 (H) 08/27/2019 1241   MCHC 35.0 08/27/2019 1241   RDW 12.6 08/27/2019 1241   LYMPHSABS 1.7 08/27/2019 1241   MONOABS 0.5 08/27/2019 1241   EOSABS 0.2 08/27/2019 1241   BASOSABS 0.0 08/27/2019 1241   CMP Latest Ref Rng & Units 08/27/2019 07/30/2019 06/26/2019  Glucose 70 - 99 mg/dL 203(H) 215(H) 277(H)  BUN 8 - 23 mg/dL 29(H) 27(H) 26(H)  Creatinine 0.61 - 1.24 mg/dL 1.31(H) 1.18 1.19  Sodium 135 - 145 mmol/L 139 141 137  Potassium 3.5 - 5.1 mmol/L 4.5 4.7 4.4  Chloride 98 - 111 mmol/L 106 106 103  CO2 22 - 32 mmol/L 25 26 26   Calcium 8.9 - 10.3 mg/dL 9.1 9.0 9.0  Total  Protein 6.5 - 8.1 g/dL 6.2(L) 6.3(L) 6.1(L)  Total Bilirubin 0.3 - 1.2 mg/dL 0.7 0.9 0.7  Alkaline Phos 38 - 126 U/L 88 85 77  AST 15 - 41 U/L 21 19 20   ALT 0 - 44 U/L 20  19 21     Radiology: I have reviewed the scans.   ASSESSMENT & PLAN:   Prostate cancer ([place]) 1.  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 -Zytiga 750 mg and prednisone 5 mg started on 06/16/2018. -CTAP and a bone scan from 05/23/2019 shows improved response compared to bone scan from prior to start of abiraterone. -Abiraterone dose increased to 4 tablets daily in the first week of January 2021 as PSA was trending up. -We reviewed labs today.  PSA is gone to 0.82.  This was 0.46 on 06/26/2019. -He is taking Zytiga on empty stomach.  He sometimes misses prednisone because of leg swelling.  I have told him to take prednisone 2.5 mg on the days of leg swellings. -He will proceed with his Eligard 22.5 mg today. -As his most recent scans showed very good response in December, I would not make any changes at this time. -We will plan to repeat PSA in 2 months.  If it goes above 1, we will consider repeating scans. -Nonchemo option on progression includes enzalutamide.  2.  Bone metastasis: -He will continue denosumab.  He will continue calcium and vitamin D supplements.      Orders placed this encounter:  Orders Placed This Encounter  Procedures  . CBC with Differential/Platelet  . Comprehensive metabolic panel  . PSA      Derek Jack, MD [place] [phone]

## 2019-08-30 MED FILL — predniSONE 5 MG TABS: 5 | 30 days supply | Qty: 30 | Fill #2

## 2019-08-30 MED FILL — ABIRATERONE ACETATE 250 MG: 250 | 30 days supply | Qty: 120 | Fill #1

## 2019-09-05 ENCOUNTER — Ambulatory Visit: Payer: Medicare Other | Attending: Internal Medicine

## 2019-09-05 DIAGNOSIS — Z23 Encounter for immunization: Secondary | ICD-10-CM

## 2019-09-05 NOTE — Progress Notes (Signed)
   Covid-19 Vaccination Clinic  Name:  Hunter Jones    MRN: [id number] DOB: 01/03/1956  09/05/2019  [name] was observed post Covid-19 immunization for 15 minutes without incident. He was provided with Vaccine Information Sheet and instruction to access the V-Safe system.   [name] was instructed to call 911 with any severe reactions post vaccine: [name] Kitchen Difficulty breathing  . Swelling of face and throat  . A fast heartbeat  . A bad rash all over body  . Dizziness and weakness   Immunizations Administered    Name Date Dose VIS Date Route   Pfizer COVID-19 Vaccine 09/05/2019  1:19 PM 0.3 mL 05/25/2019 Intramuscular   Manufacturer: [place]   Lot: [id number]   [place]: [id number]

## 2019-09-24 ENCOUNTER — Inpatient Hospital Stay (HOSPITAL_COMMUNITY): Payer: Medicare Other | Attending: Hematology

## 2019-09-24 ENCOUNTER — Other Ambulatory Visit: Payer: Self-pay

## 2019-09-24 DIAGNOSIS — C7951 Secondary malignant neoplasm of bone: Secondary | ICD-10-CM | POA: Diagnosis present

## 2019-09-24 DIAGNOSIS — C61 Malignant neoplasm of prostate: Secondary | ICD-10-CM | POA: Diagnosis present

## 2019-09-24 LAB — CBC WITH DIFFERENTIAL/PLATELET
Abs Immature Granulocytes: 0.03 10*3/uL (ref 0.00–0.07)
Basophils Absolute: 0 10*3/uL (ref 0.0–0.1)
Basophils Relative: 1 %
Eosinophils Absolute: 0.3 10*3/uL (ref 0.0–0.5)
Eosinophils Relative: 4 %
HCT: 41.2 % (ref 39.0–52.0)
Hemoglobin: 14.5 g/dL (ref 13.0–17.0)
Immature Granulocytes: 0 %
Lymphocytes Relative: 29 %
Lymphs Abs: 2 10*3/uL (ref 0.7–4.0)
MCH: 35.2 pg — ABNORMAL HIGH (ref 26.0–34.0)
MCHC: 35.2 g/dL (ref 30.0–36.0)
MCV: 100 fL (ref 80.0–100.0)
Monocytes Absolute: 0.6 10*3/uL (ref 0.1–1.0)
Monocytes Relative: 9 %
Neutro Abs: 4.1 10*3/uL (ref 1.7–7.7)
Neutrophils Relative %: 57 %
Platelets: 151 10*3/uL (ref 150–400)
RBC: 4.12 MIL/uL — ABNORMAL LOW (ref 4.22–5.81)
RDW: 13.2 % (ref 11.5–15.5)
WBC: 7.1 10*3/uL (ref 4.0–10.5)
nRBC: 0 % (ref 0.0–0.2)

## 2019-09-24 LAB — COMPREHENSIVE METABOLIC PANEL
ALT: 22 U/L (ref 0–44)
AST: 21 U/L (ref 15–41)
Albumin: 3.7 g/dL (ref 3.5–5.0)
Alkaline Phosphatase: 88 U/L (ref 38–126)
Anion gap: 7 (ref 5–15)
BUN: 27 mg/dL — ABNORMAL HIGH (ref 8–23)
CO2: 23 mmol/L (ref 22–32)
Calcium: 8.6 mg/dL — ABNORMAL LOW (ref 8.9–10.3)
Chloride: 109 mmol/L (ref 98–111)
Creatinine, Ser: 1.28 mg/dL — ABNORMAL HIGH (ref 0.61–1.24)
GFR calc Af Amer: >60 mL/min (ref >60)
GFR calc non Af Amer: 58 mL/min — ABNORMAL LOW (ref >60)
Glucose, Bld: 124 mg/dL — ABNORMAL HIGH (ref 70–99)
Potassium: 4.4 mmol/L (ref 3.5–5.1)
Sodium: 139 mmol/L (ref 135–145)
Total Bilirubin: 0.7 mg/dL (ref 0.3–1.2)
Total Protein: 6.4 g/dL — ABNORMAL LOW (ref 6.5–8.1)

## 2019-09-24 LAB — PSA: Prostatic Specific Antigen: 1 ng/mL (ref 0.00–4.00)

## 2019-09-26 ENCOUNTER — Other Ambulatory Visit: Payer: Self-pay

## 2019-09-26 ENCOUNTER — Inpatient Hospital Stay (HOSPITAL_COMMUNITY): Payer: Medicare Other

## 2019-09-26 ENCOUNTER — Other Ambulatory Visit (HOSPITAL_COMMUNITY): Payer: Self-pay | Admitting: Nurse Practitioner

## 2019-09-26 ENCOUNTER — Other Ambulatory Visit (HOSPITAL_COMMUNITY): Payer: Medicare Other

## 2019-09-26 VITALS — BP 125/56 | HR 89 | Temp 96.9°F | Resp 20

## 2019-09-26 DIAGNOSIS — C7951 Secondary malignant neoplasm of bone: Secondary | ICD-10-CM

## 2019-09-26 DIAGNOSIS — C61 Malignant neoplasm of prostate: Secondary | ICD-10-CM

## 2019-09-26 MED ORDER — DENOSUMAB 120 MG/1.7ML ~~LOC~~ SOLN
120.0000 mg | Freq: Once | SUBCUTANEOUS | Status: AC
  Administered 2019-09-26: 14:00:00 120 mg via SUBCUTANEOUS
  Filled 2019-09-26: qty 1.7

## 2019-09-26 MED FILL — predniSONE 5 MG TABS: 5 | 30 days supply | Qty: 30 | Fill #0

## 2019-09-26 NOTE — Patient Instructions (Signed)
[facility] at [facility]  Discharge Instructions:   _______________________________________________________________  Thank you for choosing [facility] at [facility] to provide your oncology and hematology care.  To afford each patient quality time with our providers, please arrive at least 15 minutes before your scheduled appointment.  You need to re-schedule your appointment if you arrive 10 or more minutes late.  We strive to give you quality time with our providers, and arriving late affects you and other patients whose appointments are after yours.  Also, if you no show three or more times for appointments you may be dismissed from the clinic.  Again, thank you for choosing  Cancer Center at [facility]. Our hope is that these requests will allow you access to exceptional care and in a timely manner. _______________________________________________________________  If you have questions after your visit, please contact our office at [phone] between the hours of 8:30 a.m. and 5:00 p.m. Voicemails left after 4:30 p.m. will not be returned until the following business day. _______________________________________________________________  For prescription refill requests, have your pharmacy contact our office. _______________________________________________________________  Recommendations made by the consultant and any test results will be sent to your referring physician. _______________________________________________________________ 

## 2019-09-26 NOTE — Progress Notes (Signed)
Patient presents today for XGeva injection. Labs reviewed with RLockamy NP. Proceed with injection. Corrected calcium 8.6. Vital signs stable.   Hunter Jones presents today for injection per MD orders. XGeva administered SQ in right Abdomen. Administration without incident. Patient tolerated well. Treatment given today per MD orders. Vital signs stable. No complaints at this time. Discharged from clinic ambulatory. F/U with [facility] as scheduled. Labs printed and copy given to the patient for review per patient's request.

## 2019-09-28 MED FILL — ABIRATERONE ACETATE 250 MG: 250 | 30 days supply | Qty: 120 | Fill #0

## 2019-10-22 ENCOUNTER — Other Ambulatory Visit: Payer: Self-pay

## 2019-10-22 ENCOUNTER — Inpatient Hospital Stay (HOSPITAL_COMMUNITY): Payer: Medicare Other | Attending: Hematology

## 2019-10-22 DIAGNOSIS — C7951 Secondary malignant neoplasm of bone: Secondary | ICD-10-CM | POA: Diagnosis not present

## 2019-10-22 DIAGNOSIS — C61 Malignant neoplasm of prostate: Secondary | ICD-10-CM | POA: Diagnosis present

## 2019-10-22 LAB — COMPREHENSIVE METABOLIC PANEL
ALT: 21 U/L (ref 0–44)
AST: 20 U/L (ref 15–41)
Albumin: 3.6 g/dL (ref 3.5–5.0)
Alkaline Phosphatase: 89 U/L (ref 38–126)
Anion gap: 9 (ref 5–15)
BUN: 31 mg/dL — ABNORMAL HIGH (ref 8–23)
CO2: 21 mmol/L — ABNORMAL LOW (ref 22–32)
Calcium: 8.7 mg/dL — ABNORMAL LOW (ref 8.9–10.3)
Chloride: 106 mmol/L (ref 98–111)
Creatinine, Ser: 1.16 mg/dL (ref 0.61–1.24)
GFR calc Af Amer: >60 mL/min (ref >60)
GFR calc non Af Amer: >60 mL/min (ref >60)
Glucose, Bld: 200 mg/dL — ABNORMAL HIGH (ref 70–99)
Potassium: 4.1 mmol/L (ref 3.5–5.1)
Sodium: 136 mmol/L (ref 135–145)
Total Bilirubin: 0.6 mg/dL (ref 0.3–1.2)
Total Protein: 6.1 g/dL — ABNORMAL LOW (ref 6.5–8.1)

## 2019-10-22 LAB — CBC WITH DIFFERENTIAL/PLATELET
Abs Immature Granulocytes: 0.02 10*3/uL (ref 0.00–0.07)
Basophils Absolute: 0 10*3/uL (ref 0.0–0.1)
Basophils Relative: 1 %
Eosinophils Absolute: 0.3 10*3/uL (ref 0.0–0.5)
Eosinophils Relative: 5 %
HCT: 40 % (ref 39.0–52.0)
Hemoglobin: 14 g/dL (ref 13.0–17.0)
Immature Granulocytes: 0 %
Lymphocytes Relative: 33 %
Lymphs Abs: 2.3 10*3/uL (ref 0.7–4.0)
MCH: 35 pg — ABNORMAL HIGH (ref 26.0–34.0)
MCHC: 35 g/dL (ref 30.0–36.0)
MCV: 100 fL (ref 80.0–100.0)
Monocytes Absolute: 0.6 10*3/uL (ref 0.1–1.0)
Monocytes Relative: 9 %
Neutro Abs: 3.7 10*3/uL (ref 1.7–7.7)
Neutrophils Relative %: 52 %
Platelets: 148 10*3/uL — ABNORMAL LOW (ref 150–400)
RBC: 4 MIL/uL — ABNORMAL LOW (ref 4.22–5.81)
RDW: 13 % (ref 11.5–15.5)
WBC: 7 10*3/uL (ref 4.0–10.5)
nRBC: 0 % (ref 0.0–0.2)

## 2019-10-24 ENCOUNTER — Other Ambulatory Visit: Payer: Self-pay

## 2019-10-24 ENCOUNTER — Inpatient Hospital Stay (HOSPITAL_COMMUNITY): Payer: Medicare Other

## 2019-10-24 ENCOUNTER — Encounter (HOSPITAL_COMMUNITY): Payer: Self-pay | Admitting: Hematology

## 2019-10-24 ENCOUNTER — Inpatient Hospital Stay (HOSPITAL_BASED_OUTPATIENT_CLINIC_OR_DEPARTMENT_OTHER): Payer: Medicare Other | Admitting: Hematology

## 2019-10-24 VITALS — HR 90 | Temp 96.4°F | Resp 16 | Wt 294.2 lb

## 2019-10-24 DIAGNOSIS — C7951 Secondary malignant neoplasm of bone: Secondary | ICD-10-CM | POA: Diagnosis not present

## 2019-10-24 DIAGNOSIS — C61 Malignant neoplasm of prostate: Secondary | ICD-10-CM

## 2019-10-24 MED ORDER — DENOSUMAB 120 MG/1.7ML ~~LOC~~ SOLN
120.0000 mg | Freq: Once | SUBCUTANEOUS | Status: AC
  Administered 2019-10-24: 120 mg via SUBCUTANEOUS
  Filled 2019-10-24: qty 1.7

## 2019-10-24 NOTE — Patient Instructions (Signed)
[facility] at [facility] Discharge Instructions Received Xgeva injection today. Follow-up as scheduled. Call clinic for any questions or concerns   Thank you for choosing [facility] at [facility] to provide your oncology and hematology care.  To afford each patient quality time with our provider, please arrive at least 15 minutes before your scheduled appointment time.   If you have a lab appointment with the Cancer Center please come in thru the Main Entrance and check in at the main information desk.  You need to re-schedule your appointment should you arrive 10 or more minutes late.  We strive to give you quality time with our providers, and arriving late affects you and other patients whose appointments are after yours.  Also, if you no show three or more times for appointments you may be dismissed from the clinic at the providers discretion.     Again, thank you for choosing [facility].  Our hope is that these requests will decrease the amount of time that you wait before being seen by our physicians.       _____________________________________________________________  Should you have questions after your visit to [facility], please contact our office at [phone] between the hours of 8:00 a.m. and 4:30 p.m.  Voicemails left after 4:00 p.m. will not be returned until the following business day.  For prescription refill requests, have your pharmacy contact our office and allow 72 hours.    Due to Covid, you will need to wear a mask upon entering the hospital. If you do not have a mask, a mask will be given to you at the Main Entrance upon arrival. For doctor visits, patients may have 1 support person with them. For treatment visits, patients can not have anyone with them due to social distancing guidelines and our immunocompromised population.     

## 2019-10-24 NOTE — Patient Instructions (Addendum)
[place] at [facility] Discharge Instructions  You were seen today by Dr. Delton Coombes. He went over your recent lab results. Due to your PSA numbers rising he will schedule you for a CT of your abdomen and a bone scan for further evaluation. He will see you back after your scans for follow up.   Thank you for choosing [place] at [facility] to provide your oncology and hematology care.  To afford each patient quality time with our provider, please arrive at least 15 minutes before your scheduled appointment time.   If you have a lab appointment with the [facility] please come in thru the  Main Entrance and check in at the main information desk  You need to re-schedule your appointment should you arrive 10 or more minutes late.  We strive to give you quality time with our providers, and arriving late affects you and other patients whose appointments are after yours.  Also, if you no show three or more times for appointments you may be dismissed from the clinic at the providers discretion.     Again, thank you for choosing [facility].  Our hope is that these requests will decrease the amount of time that you wait before being seen by our physicians.       _____________________________________________________________  Should you have questions after your visit to [facility], please contact our office at [phone] between the hours of 8:00 a.m. and 4:30 p.m.  Voicemails left after 4:00 p.m. will not be returned until the following business day.  For prescription refill requests, have your pharmacy contact our office and allow 72 hours.    Cancer Center Support Programs:   > Cancer Support Group  2nd Tuesday of the month 1pm-2pm, Journey Room

## 2019-10-24 NOTE — Progress Notes (Signed)
[address] [place], [place] [address]   CLINIC:  Medical Oncology/Hematology  PCP:  Monico Blitz, [place] [address] [phone]   REASON FOR VISIT: Follow-up for prostate cancer  CURRENT THERAPY: Zytiga and prednisone and Lupron injections  BRIEF ONCOLOGIC HISTORY:  Oncology History  Prostate cancer ([place])  02/06/2014 Procedure   Prostate biopsy by Dr. Clyde Lundborg   02/11/2014 Pathology Results   Prostatic adenocarcinoma identified in 5 of 6 prostate specimens with Gleason pattern showing primary pattern-grade 4, secondary pattern grade 3.  Total Gleason score equals 7.  Proportion of prostatic tissue involved by tumor: 70%.   05/03/2014 Imaging   Bone scan- Negative    06/27/2014 Procedure   Radical resection of prostate by Dr. Raynelle Bring   07/29/2014 Pathology Results   1. Prostate, radical resection - PROSTATIC ADENOCARCINOMA, GLEASON SCORE 4 + 3 = 7. - RIGHT AND LEFT PROSTATE INVOLVED. - RIGHT AND LEFT SEMINAL VESICLES INVOLVED BY TUMOR. - EXTRACAPSULAR EXTENSION BY TUMOR. - TUMOR EXTENDS INTO BLADDER NECK TISSUE. - MARGINS NOT INVOLVED. 2. Lymph nodes, regional resection, left pelvic - THREE BENIGN LYMPH NODES (0/3). 3. Lymph nodes, regional resection, right pelvic - FOUR BENIGN LYMPH NODES (0/4).   11/16/2016 Imaging   Bone scan- New areas of increased uptake superolateral to the left orbit (faint), at S1, and in the posterolateral aspect of the left sixth rib.    11/25/2016 Imaging   CT CAP- 1. Status post radical prostatectomy. No findings to suggest local recurrence of disease or definite extraskeletal metastatic disease in the chest, abdomen or pelvis. However, there are several osseous lesions, as above, concerning for metastatic disease to the bones. 2. Hepatic steatosis. 3. Aortic atherosclerosis, in addition to 2 vessel coronary artery disease. Please note that although the presence of coronary  artery calcium documents the presence of coronary artery disease, the severity of this disease and any potential stenosis cannot be assessed on this non-gated CT examination. Assessment for potential risk factor modification, dietary therapy or pharmacologic therapy may be warranted, if clinically indicated. 4. Additional incidental findings, as above.      CANCER STAGING: Cancer Staging Prostate cancer [facility]) Staging form: Prostate, AJCC 7th Edition - Pathologic stage from 07/29/2014: Stage III (T3b, N0, cM0, Gleason 7) - Signed by Baird Cancer, PA-C on 11/09/2016 - Clinical: Stage IV (T3, N0, M1, PSA: Less than 10, Gleason 7) - Signed by [name], MD on 12/21/2016 - Pathologic: No stage assigned - Unsigned    INTERVAL HISTORY:  Hunter Jones 64 y.o. 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 He is tolerating abiraterone and prednisone very well. Appetite is 100%. Energy levels are 75%. Pain is reported as 0 out of 10. Shortness of breath with activity is stable. Reports some urinary frequency. No dysuria.    REVIEW OF SYSTEMS:  Review of Systems  Respiratory: Positive for shortness of breath.   Cardiovascular: Positive for leg swelling.  Hematological: Bruises/bleeds easily.  Psychiatric/Behavioral: Positive for sleep disturbance.  All other systems reviewed and are negative.    PAST MEDICAL/SURGICAL HISTORY:  Past Medical History:  Diagnosis Date  . Asthma   . COPD (chronic obstructive pulmonary disease) ([place])   . Coronary artery disease    DES to mid LAD August 2014 ([facility])  . Diabetes mellitus without complication ([place])   . Essential hypertension   . Hyperthyroidism   . NSTEMI (non-ST elevated myocardial infarction) ([place]) 2014  .  Prostate cancer [facility])    Past Surgical History:  Procedure Laterality Date  . APPENDECTOMY    . LYMPHADENECTOMY Bilateral 06/27/2014   Procedure: LYMPHADENECTOMY;  Surgeon: Raynelle Bring, MD;  Location: [facility];   Service: Urology;  Laterality: Bilateral;  . ROBOT ASSISTED LAPAROSCOPIC RADICAL PROSTATECTOMY N/A 06/27/2014   Procedure: ROBOTIC ASSISTED LAPAROSCOPIC RADICAL PROSTATECTOMY LEVEL 3;  Surgeon: Raynelle Bring, MD;  Location: [facility];  Service: Urology;  Laterality: N/A;  . TONSILLECTOMY       SOCIAL HISTORY:  Social History   Socioeconomic History  . Marital status: Married    Spouse name: Not on file  . Number of children: Not on file  . Years of education: Not on file  . Highest education level: Not on file  Occupational History  . Not on file  Tobacco Use  . Smoking status: Former Smoker    Years: 40.00    Types: Cigarettes    Quit date: 06/21/2012    Years since quitting: 7.3  . Smokeless tobacco: Never Used  Substance and Sexual Activity  . Alcohol use: Yes    Comment: occasional  . Drug use: No  . Sexual activity: Not on file  Other Topics Concern  . Not on file  Social History Narrative  . Not on file   Social Determinants of Health   Financial Resource Strain:   . Difficulty of Paying Living Expenses:   Food Insecurity:   . Worried About Charity fundraiser in the Last Year:   . Arboriculturist in the Last Year:   Transportation Needs:   . Film/video editor (Medical):   [name] Kitchen Lack of Transportation (Non-Medical):   Physical Activity:   . Days of Exercise per Week:   . Minutes of Exercise per Session:   Stress:   . Feeling of Stress :   Social Connections:   . Frequency of Communication with Friends and Family:   . Frequency of Social Gatherings with Friends and Family:   . Attends Religious Services:   . Active Member of Clubs or Organizations:   . Attends Archivist Meetings:   [name] Kitchen Marital Status:   Intimate Partner Violence:   . Fear of Current or Ex-Partner:   . Emotionally Abused:   [name] Kitchen Physically Abused:   . Sexually Abused:     FAMILY HISTORY:  Family History  Problem Relation Age of Onset  . Dementia Mother   . Thyroid disease  Mother   . Kidney Stones Father   . Stroke Brother     CURRENT MEDICATIONS:  Outpatient Encounter Medications as of 10/24/2019  Medication Sig  . abiraterone acetate (ZYTIGA) 250 MG tablet TAKE 4 TABLETS (1,000 MG TOTAL) BY MOUTH DAILY. TAKE ON AN EMPTY STOMACH 1 HOUR BEFORE OR 2 HOURS AFTER A MEAL  . albuterol (PROVENTIL HFA;VENTOLIN HFA) 108 (90 BASE) MCG/ACT inhaler Inhale 1-2 puffs into the lungs every 6 (six) hours as needed for wheezing or shortness of breath.  [name] Kitchen aspirin EC 81 MG tablet Take 81 mg by mouth every other day.   [name] Kitchen atorvastatin (LIPITOR) 40 MG tablet TAKE 1 TABLET BY MOUTH AT BEDTIME.  . Calcium-Magnesium-Vitamin D (CALCIUM 500 PO) Take 2 tablets by mouth daily. Per pt he takes 600mg  BID  . Co-Enzyme Q-10 100 MG CAPS Take 1 capsule by mouth daily.  [name] Kitchen denosumab (XGEVA) 120 MG/1.7ML SOLN injection Inject 120 mg into the skin every 30 (thirty) days.  . fluticasone-salmeterol (ADVAIR HFA) 115-21 MCG/ACT inhaler  Inhale 2 puffs into the lungs 2 (two) times daily.   [name] Kitchen KRILL OIL PO Take 1 capsule by mouth daily.   [name] Kitchen Leuprolide Acetate, 3 Month, (LUPRON DEPOT, 63-MONTH, IM) Inject into the muscle every 3 (three) months.  [name] Kitchen losartan (COZAAR) 25 MG tablet Take 25 mg by mouth daily.  . methimazole (TAPAZOLE) 10 MG tablet Take 10 mg by mouth every other day.   . metoprolol succinate (TOPROL-XL) 50 MG 24 hr tablet Take 50 mg by mouth every morning. Take with or immediately following a meal.  . Multiple Vitamin (MULTIVITAMIN) tablet Take 1 tablet by mouth daily.  . Potassium 99 MG TABS Take 1 tablet by mouth daily.  . predniSONE (DELTASONE) 5 MG tablet TAKE 1 TABLET BY MOUTH ONCE DAILY WITH BREAKFAST  . triamcinolone cream (KENALOG) 0.1 % Apply 1 application topically as needed.    No facility-administered encounter medications on file as of 10/24/2019.    ALLERGIES:  Allergies  Allergen Reactions  . Penicillins Hives     PHYSICAL EXAM:  ECOG Performance status: 1  Vitals:    10/24/19 1529  Pulse: 90  Resp: 16  Temp: (!) 96.4 F (35.8 C)  SpO2: 97%   Filed Weights   10/24/19 1529  Weight: 294 lb 3.2 oz (133.4 kg)    Physical Exam Constitutional:      Appearance: Normal appearance. He is normal weight.  Cardiovascular:     Rate and Rhythm: Normal rate and regular rhythm.     Heart sounds: Normal heart sounds.  Pulmonary:     Effort: Pulmonary effort is normal.     Breath sounds: Normal breath sounds.  Abdominal:     General: Bowel sounds are normal. There is no distension.     Palpations: Abdomen is soft. There is no mass.  Musculoskeletal:        General: Normal range of motion.  Skin:    General: Skin is warm and dry.  Neurological:     Mental Status: He is alert and oriented to person, place, and time. Mental status is at baseline.  Psychiatric:        Mood and Affect: Mood normal.        Behavior: Behavior normal.        Thought Content: Thought content normal.        Judgment: Judgment normal.      LABORATORY DATA:  I have reviewed the labs as listed.  CBC    Component Value Date/Time   WBC 7.0 10/22/2019 1339   RBC 4.00 (L) 10/22/2019 1339   HGB 14.0 10/22/2019 1339   HCT 40.0 10/22/2019 1339   PLT 148 (L) 10/22/2019 1339   MCV 100.0 10/22/2019 1339   MCH 35.0 (H) 10/22/2019 1339   MCHC 35.0 10/22/2019 1339   RDW 13.0 10/22/2019 1339   LYMPHSABS 2.3 10/22/2019 1339   MONOABS 0.6 10/22/2019 1339   EOSABS 0.3 10/22/2019 1339   BASOSABS 0.0 10/22/2019 1339   CMP Latest Ref Rng & Units 10/22/2019 09/24/2019 08/27/2019  Glucose 70 - 99 mg/dL 200(H) 124(H) 203(H)  BUN 8 - 23 mg/dL 31(H) 27(H) 29(H)  Creatinine 0.61 - 1.24 mg/dL 1.16 1.28(H) 1.31(H)  Sodium 135 - 145 mmol/L 136 139 139  Potassium 3.5 - 5.1 mmol/L 4.1 4.4 4.5  Chloride 98 - 111 mmol/L 106 109 106  CO2 22 - 32 mmol/L 21(L) 23 25  Calcium 8.9 - 10.3 mg/dL 8.7(L) 8.6(L) 9.1  Total Protein 6.5 - 8.1 g/dL 6.1(L) 6.4(L)  6.2(L)  Total Bilirubin 0.3 - 1.2 mg/dL 0.6  0.7 0.7  Alkaline Phos 38 - 126 U/L 89 88 88  AST 15 - 41 U/L 20 21 21   ALT 0 - 44 U/L 21 22 20     Radiology: I have reviewed the scans.   ASSESSMENT & PLAN:   Prostate cancer ([place]) 1. 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 -Zytiga 750 mg and prednisone 5 mg started on 06/16/2018. -CTAP and bone scan on 05/23/2019 showed improved response compared to bone scan from prior to start of Abiraterone. -Abiraterone dose increased to 4 tablets daily in the first week of January 2021 as PSA was trending up. -Latest PSA in the first week of April was 1.0. -He is tolerating abiraterone very well. He has some leg swelling from prednisone. -As his PSA was consistently rising up, I have recommended a whole-body bone scan and a CT scan of the abdomen and pelvis. -He is receiving Eligard 22.5 mg every 3 months. I will see him back after the scan.  2. Bone metastasis: -His calcium is 8.7 today. He will continue monthly denosumab. He will continue calcium and vitamin D supplements.  3. Hyperglycemia: -His blood sugar is elevated at 200. We will check his hemoglobin A1c.      Orders placed this encounter:  Orders Placed This Encounter  Procedures  . CT Abdomen Pelvis W Contrast  . NM Bone Scan Whole Body  . PSA  . Hemoglobin A1c      Derek Jack, MD [place] [phone]

## 2019-10-24 NOTE — Progress Notes (Signed)
[place] reviewed with and pt seen by Dr. Delton Coombes and pt approved for Xgeva injection today per MD                                     Phillips Climes tolerated Xgeva injection well without complaints or incident. Calcium 8.7 and pt denies any tooth or jaw pain and no recent or future dental visits prior to administering the Xgeva.Pt continues to take his Calcium PO as prescribed without issues. Pt discharged self ambulatory in satisfactory condition

## 2019-10-24 NOTE — Assessment & Plan Note (Signed)
1. 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 -Zytiga 750 mg and prednisone 5 mg started on 06/16/2018. -CTAP and bone scan on 05/23/2019 showed improved response compared to bone scan from prior to start of Abiraterone. -Abiraterone dose increased to 4 tablets daily in the first week of January 2021 as PSA was trending up. -Latest PSA in the first week of April was 1.0. -He is tolerating abiraterone very well. He has some leg swelling from prednisone. -As his PSA was consistently rising up, I have recommended a whole-body bone scan and a CT scan of the abdomen and pelvis. -He is receiving Eligard 22.5 mg every 3 months. I will see him back after the scan.  2. Bone metastasis: -His calcium is 8.7 today. He will continue monthly denosumab. He will continue calcium and vitamin D supplements.  3. Hyperglycemia: -His blood sugar is elevated at 200. We will check his hemoglobin A1c.

## 2019-11-06 ENCOUNTER — Ambulatory Visit (HOSPITAL_COMMUNITY)
Admission: RE | Admit: 2019-11-06 | Discharge: 2019-11-06 | Disposition: A | Discharge status: Home / Self Care | Payer: Medicare Other | Source: Ambulatory Visit | Attending: Hematology | Admitting: Hematology

## 2019-11-06 ENCOUNTER — Encounter (HOSPITAL_COMMUNITY): Payer: Self-pay

## 2019-11-06 ENCOUNTER — Other Ambulatory Visit: Payer: Self-pay

## 2019-11-06 DIAGNOSIS — C61 Malignant neoplasm of prostate: Secondary | ICD-10-CM

## 2019-11-06 MED ORDER — IOHEXOL 300 MG/ML  SOLN
100.0000 mL | Freq: Once | INTRAMUSCULAR | Status: AC | PRN
  Administered 2019-11-06: 100 mL via INTRAVENOUS

## 2019-11-06 MED ORDER — IOHEXOL 9 MG/ML PO SOLN
ORAL | Status: AC
  Filled 2019-11-06: qty 1000

## 2019-11-06 MED ORDER — TECHNETIUM TC 99M MEDRONATE IV KIT
20.0000 | PACK | Freq: Once | INTRAVENOUS | Status: AC | PRN
  Administered 2019-11-06: 21.3 via INTRAVENOUS

## 2019-11-08 ENCOUNTER — Inpatient Hospital Stay (HOSPITAL_BASED_OUTPATIENT_CLINIC_OR_DEPARTMENT_OTHER): Payer: Medicare Other | Admitting: Hematology

## 2019-11-08 ENCOUNTER — Other Ambulatory Visit: Payer: Self-pay

## 2019-11-08 ENCOUNTER — Inpatient Hospital Stay (HOSPITAL_COMMUNITY): Payer: Medicare Other

## 2019-11-08 VITALS — BP 107/55 | HR 90 | Temp 97.1°F | Resp 19 | Wt 298.4 lb

## 2019-11-08 DIAGNOSIS — C7951 Secondary malignant neoplasm of bone: Secondary | ICD-10-CM | POA: Diagnosis not present

## 2019-11-08 DIAGNOSIS — C61 Malignant neoplasm of prostate: Secondary | ICD-10-CM

## 2019-11-08 LAB — PSA: Prostatic Specific Antigen: 1.54 ng/mL (ref 0.00–4.00)

## 2019-11-08 MED ORDER — ENZALUTAMIDE 80 MG PO TABS: 160.0000 mg | ORAL_TABLET | Freq: Every day | ORAL | 0 refills | Status: DC

## 2019-11-08 NOTE — Progress Notes (Signed)
Hunter Jones, [place] [address]   CLINIC:  Medical Oncology/Hematology  PCP:  Monico Blitz, [place] [address] [phone]   REASON FOR VISIT:  Follow-up for prostate cancer  PRIOR THERAPY: Lupron  NGS Results: Not done  CURRENT THERAPY: Zytiga ANDprednisone ANDLupron injections  BRIEF ONCOLOGIC HISTORY:  Oncology History  Prostate cancer ([place])  02/06/2014 Procedure   Prostate biopsy by Dr. Clyde Lundborg   02/11/2014 Pathology Results   Prostatic adenocarcinoma identified in 5 of 6 prostate specimens with Gleason pattern showing primary pattern-grade 4, secondary pattern grade 3.  Total Gleason score equals 7.  Proportion of prostatic tissue involved by tumor: 70%.   05/03/2014 Imaging   Bone scan- Negative    06/27/2014 Procedure   Radical resection of prostate by Dr. Raynelle Bring   07/29/2014 Pathology Results   1. Prostate, radical resection - PROSTATIC ADENOCARCINOMA, GLEASON SCORE 4 + 3 = 7. - RIGHT AND LEFT PROSTATE INVOLVED. - RIGHT AND LEFT SEMINAL VESICLES INVOLVED BY TUMOR. - EXTRACAPSULAR EXTENSION BY TUMOR. - TUMOR EXTENDS INTO BLADDER NECK TISSUE. - MARGINS NOT INVOLVED. 2. Lymph nodes, regional resection, left pelvic - THREE BENIGN LYMPH NODES (0/3). 3. Lymph nodes, regional resection, right pelvic - FOUR BENIGN LYMPH NODES (0/4).   11/16/2016 Imaging   Bone scan- New areas of increased uptake superolateral to the left orbit (faint), at S1, and in the posterolateral aspect of the left sixth rib.    11/25/2016 Imaging   CT CAP- 1. Status post radical prostatectomy. No findings to suggest local recurrence of disease or definite extraskeletal metastatic disease in the chest, abdomen or pelvis. However, there are several osseous lesions, as above, concerning for metastatic disease to the bones. 2. Hepatic steatosis. 3. Aortic atherosclerosis, in addition to 2 vessel coronary artery disease. Please  note that although the presence of coronary artery calcium documents the presence of coronary artery disease, the severity of this disease and any potential stenosis cannot be assessed on this non-gated CT examination. Assessment for potential risk factor modification, dietary therapy or pharmacologic therapy may be warranted, if clinically indicated. 4. Additional incidental findings, as above.     CANCER STAGING: Cancer Staging Prostate cancer [facility]) Staging form: Prostate, AJCC 7th Edition - Pathologic stage from 07/29/2014: Stage III (T3b, N0, cM0, Gleason 7) - Signed by Baird Cancer, PA-C on 11/09/2016 - Clinical: Stage IV (T3, N0, M1, PSA: Less than 10, Gleason 7) - Signed by [name], MD on 12/21/2016 - Pathologic: No stage assigned - Unsigned   INTERVAL HISTORY:  Hunter Jones, a 64 y.o. male, returns for routine follow-up. Hunter Jones was last seen on 10/24/2019.  Overall, he tells me he has been feeling pretty well. He is trying to ween himself off of prednisone and is currently taking one 5 mg pill every other day. He will be due for his Lupron in June. He declines a history of seizures.  Shortness of breath on exertion is stable.  Appetite and energy levels are 100%.  Occasional constipation and diarrhea is stable.   REVIEW OF SYSTEMS:  Review of Systems  Constitutional: Negative for appetite change and fatigue.  Respiratory: Positive for shortness of breath (COPD).   Gastrointestinal: Positive for constipation and diarrhea.  All other systems reviewed and are negative.   PAST MEDICAL/SURGICAL HISTORY:  Past Medical History:  Diagnosis Date  . Asthma   . COPD (chronic obstructive pulmonary disease) ([place])   .  Coronary artery disease    DES to mid LAD August 2014 ([facility])  . Diabetes mellitus without complication ([place])   . Essential hypertension   . Hyperthyroidism   . NSTEMI (non-ST elevated myocardial infarction) ([place]) 2014  . Prostate cancer [facility])    Past  Surgical History:  Procedure Laterality Date  . APPENDECTOMY    . LYMPHADENECTOMY Bilateral 06/27/2014   Procedure: LYMPHADENECTOMY;  Surgeon: Raynelle Bring, MD;  Location: [facility];  Service: Urology;  Laterality: Bilateral;  . ROBOT ASSISTED LAPAROSCOPIC RADICAL PROSTATECTOMY N/A 06/27/2014   Procedure: ROBOTIC ASSISTED LAPAROSCOPIC RADICAL PROSTATECTOMY LEVEL 3;  Surgeon: Raynelle Bring, MD;  Location: [facility];  Service: Urology;  Laterality: N/A;  . TONSILLECTOMY      SOCIAL HISTORY:  Social History   Socioeconomic History  . Marital status: Married    Spouse name: Not on file  . Number of children: Not on file  . Years of education: Not on file  . Highest education level: Not on file  Occupational History  . Not on file  Tobacco Use  . Smoking status: Former Smoker    Years: 40.00    Types: Cigarettes    Quit date: 06/21/2012    Years since quitting: 7.3  . Smokeless tobacco: Never Used  Substance and Sexual Activity  . Alcohol use: Yes    Comment: occasional  . Drug use: No  . Sexual activity: Not on file  Other Topics Concern  . Not on file  Social History Narrative  . Not on file   Social Determinants of Health   Financial Resource Strain:   . Difficulty of Paying Living Expenses:   Food Insecurity:   . Worried About Charity fundraiser in the Last Year:   . Arboriculturist in the Last Year:   Transportation Needs:   . Film/video editor (Medical):   [name] Kitchen Lack of Transportation (Non-Medical):   Physical Activity:   . Days of Exercise per Week:   . Minutes of Exercise per Session:   Stress:   . Feeling of Stress :   Social Connections:   . Frequency of Communication with Friends and Family:   . Frequency of Social Gatherings with Friends and Family:   . Attends Religious Services:   . Active Member of Clubs or Organizations:   . Attends Archivist Meetings:   [name] Kitchen Marital Status:   Intimate Partner Violence:   . Fear of Current or Ex-Partner:   .  Emotionally Abused:   [name] Kitchen Physically Abused:   . Sexually Abused:     FAMILY HISTORY:  Family History  Problem Relation Age of Onset  . Dementia Mother   . Thyroid disease Mother   . Kidney Stones Father   . Stroke Brother     CURRENT MEDICATIONS:  Current Outpatient Medications  Medication Sig Dispense Refill  . abiraterone acetate (ZYTIGA) 250 MG tablet TAKE 4 TABLETS (1,000 MG TOTAL) BY MOUTH DAILY. TAKE ON AN EMPTY STOMACH 1 HOUR BEFORE OR 2 HOURS AFTER A MEAL 120 tablet 1  . albuterol (PROVENTIL HFA;VENTOLIN HFA) 108 (90 BASE) MCG/ACT inhaler Inhale 1-2 puffs into the lungs every 6 (six) hours as needed for wheezing or shortness of breath.    [name] Kitchen aspirin EC 81 MG tablet Take 81 mg by mouth every other day.     [name] Kitchen atorvastatin (LIPITOR) 40 MG tablet TAKE 1 TABLET BY MOUTH AT BEDTIME. 30 tablet 6  . Calcium-Magnesium-Vitamin D (CALCIUM 500 PO) Take 2  tablets by mouth daily. Per pt he takes 600mg  BID    . Co-Enzyme Q-10 100 MG CAPS Take 1 capsule by mouth daily.    [name] Kitchen denosumab (XGEVA) 120 MG/1.7ML SOLN injection Inject 120 mg into the skin every 30 (thirty) days.    . fluticasone-salmeterol (ADVAIR HFA) 115-21 MCG/ACT inhaler Inhale 2 puffs into the lungs 2 (two) times daily.     [name] Kitchen KRILL OIL PO Take 1 capsule by mouth daily.     [name] Kitchen Leuprolide Acetate, 3 Month, (LUPRON DEPOT, 46-MONTH, IM) Inject into the muscle every 3 (three) months.    [name] Kitchen losartan (COZAAR) 25 MG tablet Take 25 mg by mouth daily.    . methimazole (TAPAZOLE) 10 MG tablet Take 10 mg by mouth every other day.     . metoprolol succinate (TOPROL-XL) 50 MG 24 hr tablet Take 50 mg by mouth every morning. Take with or immediately following a meal.    . Multiple Vitamin (MULTIVITAMIN) tablet Take 1 tablet by mouth daily.    . Potassium 99 MG TABS Take 1 tablet by mouth daily.    . predniSONE (DELTASONE) 5 MG tablet TAKE 1 TABLET BY MOUTH ONCE DAILY WITH BREAKFAST 30 tablet 2  . triamcinolone cream (KENALOG) 0.1 % Apply 1  application topically as needed.      No current facility-administered medications for this visit.    ALLERGIES:  Allergies  Allergen Reactions  . Penicillins Hives    PHYSICAL EXAM:  Performance status (ECOG): 1 - Symptomatic but completely ambulatory  Vitals:   11/08/19 1404  BP: (!) 107/55  Pulse: 90  Resp: 19  Temp: (!) 97.1 F (36.2 C)  SpO2: 93%   Wt Readings from Last 3 Encounters:  11/08/19 298 lb 6.4 oz (135.4 kg)  10/24/19 294 lb 3.2 oz (133.4 kg)  08/29/19 299 lb 4.8 oz (135.8 kg)   Physical Exam Vitals reviewed.  Constitutional:      Appearance: Normal appearance.  Cardiovascular:     Rate and Rhythm: Normal rate and regular rhythm.     Heart sounds: Normal heart sounds.  Pulmonary:     Effort: Pulmonary effort is normal.     Breath sounds: Normal breath sounds.  Abdominal:     General: There is no distension.     Palpations: Abdomen is soft.  Musculoskeletal:     Right lower leg: Edema present.     Left lower leg: Edema present.  Neurological:     General: No focal deficit present.     Mental Status: He is alert and oriented to person, place, and time.  Psychiatric:        Mood and Affect: Mood normal.        Behavior: Behavior normal.     LABORATORY DATA:  I have reviewed the labs as listed.  CBC Latest Ref Rng & Units 10/22/2019 09/24/2019 08/27/2019  WBC 4.0 - 10.5 K/uL 7.0 7.1 6.5  Hemoglobin 13.0 - 17.0 g/dL 14.0 14.5 13.9  Hematocrit 39.0 - 52.0 % 40.0 41.2 39.7  Platelets 150 - 400 K/uL 148(L) 151 145(L)   CMP Latest Ref Rng & Units 10/22/2019 09/24/2019 08/27/2019  Glucose 70 - 99 mg/dL 200(H) 124(H) 203(H)  BUN 8 - 23 mg/dL 31(H) 27(H) 29(H)  Creatinine 0.61 - 1.24 mg/dL 1.16 1.28(H) 1.31(H)  Sodium 135 - 145 mmol/L 136 139 139  Potassium 3.5 - 5.1 mmol/L 4.1 4.4 4.5  Chloride 98 - 111 mmol/L 106 109 106  CO2 22 - 32 mmol/L  21(L) 23 25  Calcium 8.9 - 10.3 mg/dL 8.7(L) 8.6(L) 9.1  Total Protein 6.5 - 8.1 g/dL 6.1(L) 6.4(L) 6.2(L)    Total Bilirubin 0.3 - 1.2 mg/dL 0.6 0.7 0.7  Alkaline Phos 38 - 126 U/L 89 88 88  AST 15 - 41 U/L 20 21 21   ALT 0 - 44 U/L 21 22 20    Lab Results  Component Value Date   PSA 3.68 11/09/2016    DIAGNOSTIC IMAGING:  I have independently reviewed the scans and discussed with the patient. NM Bone Scan Whole Body  Result Date: 11/07/2019 CLINICAL DATA:  History of prostate cancer EXAM: NUCLEAR MEDICINE WHOLE BODY BONE SCAN TECHNIQUE: Whole body anterior and posterior images were obtained approximately 3 hours after intravenous injection of radiopharmaceutical. RADIOPHARMACEUTICALS:  21.3 mCi Technetium-69m MDP IV COMPARISON:  05/23/2019 FINDINGS: Increased sacral uptake grossly sellar to prior study, potentially slightly increased in terms of geographic distribution. No signs of rib activity or other sites of skeletal involvement as was seen on more remote studiesw with resolution of activity in these areas on the prior exam. Physiologic activity within the urinary tract as expected similar to prior studies. IMPRESSION: Signs of sclerotic metastatic disease to the sacrum. Similar to slightly increased activity as compared to the previous study could relate to technical factors. Other areas of bony sclerosis without significant uptake on the current exam. Electronically Signed   By: Zetta Bills M.D.   On: 11/07/2019 07:18   CT Abdomen Pelvis W Contrast  Result Date: 11/07/2019 CLINICAL DATA:  Prostate cancer EXAM: CT ABDOMEN AND PELVIS WITH CONTRAST TECHNIQUE: Multidetector CT imaging of the abdomen and pelvis was performed using the standard protocol following bolus administration of intravenous contrast. CONTRAST:  125mL OMNIPAQUE IOHEXOL 300 MG/ML  SOLN COMPARISON:  05/23/2019 FINDINGS: Lower chest: Basilar atelectasis. No consolidation. No pleural effusion. Hepatobiliary: Liver is unremarkable. No pericholecystic stranding. Portal vein is patent. No biliary ductal dilation. Pancreas: Pancreas is  normal without signs of inflammation, cold dilation or lesion. Spleen: Spleen normal size without focal lesion. Adrenals/Urinary Tract: Adrenal glands are normal. Renal contours with mild scarring, no suspicious lesion. No hydronephrosis. Urinary bladder is decompressed limiting assessment with similar appearance to the previous exam following prostatectomy. Stomach/Bowel: No sign of acute gastrointestinal process. Stool fills much of the colon. Colonic diverticulosis as before. Vascular/Lymphatic: Minimal calcified atheromatous plaque in the abdominal aorta. No aneurysmal dilation. Mild bit greater amount of calcification seen in the iliac vessels. No pelvic lymphadenopathy. Reproductive: Post prostatectomy. Other: No free fluid. Musculoskeletal: Signs of multifocal bony sclerosis worse in the sacrum unchanged CT appearance. No acute bone process. IMPRESSION: 1. Post prostatectomy. 2. Unchanged appearance of diffuse sclerotic bony metastases. 3. Aortic atherosclerosis. Aortic Atherosclerosis (ICD10-I70.0). Electronically Signed   By: Zetta Bills M.D.   On: 11/07/2019 07:26     ASSESSMENT:  1.  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 -Abiraterone 750 mg and prednisone 5 mg started on 06/16/2018. -CTAP and bone scan on 05/23/2019 showed improved response compared to prior scans. -Abiraterone dose increased to 1000 mg daily in the first week of January 2021 as PSA was going up. -CT AP on 11/06/2019 showed unchanged appearance of diffuse sclerotic bone metastasis.  No visceral mets. -Bone scan on 11/06/2019 shows sclerotic metastatic disease to the sacrum.  Similar to slightly increased activity compared to the previous study could be due to technical factors.  No new areas.    PLAN:  1.  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  Last PSA in the first week of April was 1.0.  PSA and testosterone from today will  be sent. -I discussed and reviewed scans with the patient.  Even though there is no radiological progression, he is continuing to have elevated PSA. -I have recommended switching therapy to enzalutamide.  I will send a prescription to the pharmacy. -We discussed side effects of enzalutamide including but not limited to asthenia and rare chance of seizures. -I plan to see him back in 2 weeks for follow-up after the start of enzalutamide. -I will also plan to do germline mutation testing.  We will also consider guardant 364 somatic mutation testing.  2.  Bone metastasis: -He will continue monthly denosumab.  Continue calcium and vitamin D supplements.  Last calcium was normal.  3.  Hyperglycemia: -Lately his sugars are elevated.  If they continue to be elevated, will check hemoglobin A1c.  He is weaning off of prednisone.    Orders placed this encounter:  No orders of the defined types were placed in this encounter.  Total time spent is 40 minutes with more than 50% of the time spent face-to-face discussing and reviewing scans, change in treatment plan, side effects, counseling and coordination of care.  Derek Jack, MD [facility] [phone]   I, Jacqualyn Posey, am acting as a scribe for Dr. Sanda Linger.  I, Derek Jack MD, have reviewed the above documentation for accuracy and completeness, and I agree with the above.

## 2019-11-08 NOTE — Patient Instructions (Signed)
[place] at [facility] Discharge Instructions  You were seen today by Dr. Delton Coombes. He went over your recent results. We will be repeating your PSA to check if it is continuing to increasing; if it is, we will swap you from [place] to [place]. He will see you back in for labs and follow up.   Thank you for choosing [place] at [facility] to provide your oncology and hematology care.  To afford each patient quality time with our provider, please arrive at least 15 minutes before your scheduled appointment time.   If you have a lab appointment with the [place] please come in thru the  Main Entrance and check in at the main information desk  You need to re-schedule your appointment should you arrive 10 or more minutes late.  We strive to give you quality time with our providers, and arriving late affects you and other patients whose appointments are after yours.  Also, if you no show three or more times for appointments you may be dismissed from the clinic at the providers discretion.     Again, thank you for choosing [facility].  Our hope is that these requests will decrease the amount of time that you wait before being seen by our physicians.       _____________________________________________________________  Should you have questions after your visit to [facility], please contact our office at [phone] between the hours of 8:00 a.m. and 4:30 p.m.  Voicemails left after 4:00 p.m. will not be returned until the following business day.  For prescription refill requests, have your pharmacy contact our office and allow 72 hours.    Cancer Center Support Programs:   > Cancer Support Group  2nd Tuesday of the month 1pm-2pm, Journey Room

## 2019-11-09 ENCOUNTER — Telehealth (HOSPITAL_COMMUNITY): Payer: Self-pay | Admitting: Pharmacist

## 2019-11-09 ENCOUNTER — Telehealth: Payer: Self-pay

## 2019-11-09 LAB — TESTOSTERONE: Testosterone: <3 ng/dL — ABNORMAL LOW (ref 264–916)

## 2019-11-09 NOTE — Telephone Encounter (Signed)
Oral Oncology Patient Advocate Encounter  Prior Authorization for Gillermina Phy has been approved.    PA# [id number] Effective dates: 06/15/19 through 06/13/20  Patients co-pay is $1660.07  Oral Oncology Clinic will continue to follow.   [place] Patient [place] Phone [phone] Fax [phone] 11/09/2019 8:48 AM

## 2019-11-09 NOTE — Telephone Encounter (Signed)
Oral Oncology Patient Advocate Encounter  Received notification from [facility] that prior authorization for Gillermina Phy is required.  PA submitted on CoverMyMeds Key [id number] Status is pending  Oral Oncology Clinic will continue to follow.  [place] Patient [place] Phone [phone] Fax [phone] 11/09/2019 8:18 AM

## 2019-11-09 NOTE — Telephone Encounter (Signed)
Oral Oncology Pharmacist Encounter  Received new prescription for Xtandi (enzalutamide) for the treatment of metastatic CRPC, planned duration until disease progression or unacceptable drug toxicity.  Prescription dose and frequency assessed.   Current medication list in Epic reviewed, a few DDIs with enzalutamide identified: - Xtandi may decrease the serum concentration of atorvastatin and losartan. Monitor patient for decreased effectiveness of the listed medications. No baseline dose adjustment needed.   Prescription has been e-scribed to the [facility] for benefits analysis and approval.  Oral Oncology Clinic will continue to follow for insurance authorization, copayment issues, initial counseling and start date.  Darl Pikes, PharmD, BCPS, BCOP, CPP Hematology/Oncology Clinical Pharmacist Practitioner [facility] Oral [facility] [phone]  11/09/2019 9:45 AM

## 2019-11-09 NOTE — Telephone Encounter (Signed)
Oral Chemotherapy Pharmacist Encounter  [place] will deliver medication to Hunter Jones on 11/14/19. He knows to get started when he receives the medication.  Patient Education I spoke with patient for overview of new oral chemotherapy medication: Xtandi (enzalutamide) for the treatment of metastatic CRPC, planned duration until disease progression or unacceptable drug toxicity.   Pt is doing well. Counseled patient on administration, dosing, side effects, monitoring, drug-food interactions, safe handling, storage, and disposal. Patient will take 2 tablets (160 mg total) by mouth daily.  Side effects include but not limited to: fatigue, HTN, review potential seizure risk.    Reviewed with patient importance of keeping a medication schedule and plan for any missed doses.  Mr. [name] voiced understanding and appreciation. All questions answered. Medication handout placed in the mail.  Provided patient with Oral [place] [place] [facility] phone number. Patient knows to call the office with questions or concerns. Oral Chemotherapy Navigation Clinic will continue to follow.  Darl Pikes, PharmD, BCPS, BCOP, CPP Hematology/Oncology Clinical Pharmacist Practitioner [facility] [phone]  11/09/2019 2:29 PM

## 2019-11-13 DIAGNOSIS — I2119 ST elevation (STEMI) myocardial infarction involving other coronary artery of inferior wall: Secondary | ICD-10-CM

## 2019-11-13 HISTORY — DX: ST elevation (STEMI) myocardial infarction involving other coronary artery of inferior wall: I21.19

## 2019-11-13 MED FILL — XTANDI 80 MG TABS: 80 | 30 days supply | Qty: 60 | Fill #0

## 2019-11-20 ENCOUNTER — Inpatient Hospital Stay (HOSPITAL_COMMUNITY): Payer: Medicare Other | Attending: Hematology

## 2019-11-20 ENCOUNTER — Other Ambulatory Visit: Payer: Self-pay

## 2019-11-20 DIAGNOSIS — C7951 Secondary malignant neoplasm of bone: Secondary | ICD-10-CM | POA: Diagnosis present

## 2019-11-20 DIAGNOSIS — Z7982 Long term (current) use of aspirin: Secondary | ICD-10-CM | POA: Diagnosis not present

## 2019-11-20 DIAGNOSIS — I2510 Atherosclerotic heart disease of native coronary artery without angina pectoris: Secondary | ICD-10-CM | POA: Insufficient documentation

## 2019-11-20 DIAGNOSIS — C61 Malignant neoplasm of prostate: Secondary | ICD-10-CM

## 2019-11-20 DIAGNOSIS — Z5111 Encounter for antineoplastic chemotherapy: Secondary | ICD-10-CM | POA: Diagnosis not present

## 2019-11-20 DIAGNOSIS — I10 Essential (primary) hypertension: Secondary | ICD-10-CM | POA: Diagnosis not present

## 2019-11-20 DIAGNOSIS — Z79899 Other long term (current) drug therapy: Secondary | ICD-10-CM | POA: Insufficient documentation

## 2019-11-20 DIAGNOSIS — I252 Old myocardial infarction: Secondary | ICD-10-CM | POA: Insufficient documentation

## 2019-11-20 DIAGNOSIS — F39 Unspecified mood [affective] disorder: Secondary | ICD-10-CM | POA: Diagnosis not present

## 2019-11-20 DIAGNOSIS — E1165 Type 2 diabetes mellitus with hyperglycemia: Secondary | ICD-10-CM | POA: Insufficient documentation

## 2019-11-20 DIAGNOSIS — Z7952 Long term (current) use of systemic steroids: Secondary | ICD-10-CM | POA: Insufficient documentation

## 2019-11-20 DIAGNOSIS — E0590 Thyrotoxicosis, unspecified without thyrotoxic crisis or storm: Secondary | ICD-10-CM | POA: Diagnosis not present

## 2019-11-20 DIAGNOSIS — Z87891 Personal history of nicotine dependence: Secondary | ICD-10-CM | POA: Diagnosis not present

## 2019-11-20 DIAGNOSIS — Z9079 Acquired absence of other genital organ(s): Secondary | ICD-10-CM | POA: Insufficient documentation

## 2019-11-20 DIAGNOSIS — J449 Chronic obstructive pulmonary disease, unspecified: Secondary | ICD-10-CM | POA: Diagnosis not present

## 2019-11-20 LAB — CBC WITH DIFFERENTIAL/PLATELET
Abs Immature Granulocytes: 0.01 10*3/uL (ref 0.00–0.07)
Basophils Absolute: 0 10*3/uL (ref 0.0–0.1)
Basophils Relative: 1 %
Eosinophils Absolute: 0.3 10*3/uL (ref 0.0–0.5)
Eosinophils Relative: 5 %
HCT: 38.5 % — ABNORMAL LOW (ref 39.0–52.0)
Hemoglobin: 13.5 g/dL (ref 13.0–17.0)
Immature Granulocytes: 0 %
Lymphocytes Relative: 34 %
Lymphs Abs: 2.1 10*3/uL (ref 0.7–4.0)
MCH: 34.5 pg — ABNORMAL HIGH (ref 26.0–34.0)
MCHC: 35.1 g/dL (ref 30.0–36.0)
MCV: 98.5 fL (ref 80.0–100.0)
Monocytes Absolute: 0.6 10*3/uL (ref 0.1–1.0)
Monocytes Relative: 10 %
Neutro Abs: 3.1 10*3/uL (ref 1.7–7.7)
Neutrophils Relative %: 50 %
Platelets: 162 10*3/uL (ref 150–400)
RBC: 3.91 MIL/uL — ABNORMAL LOW (ref 4.22–5.81)
RDW: 12.8 % (ref 11.5–15.5)
WBC: 6.1 10*3/uL (ref 4.0–10.5)
nRBC: 0 % (ref 0.0–0.2)

## 2019-11-20 LAB — COMPREHENSIVE METABOLIC PANEL
ALT: 22 U/L (ref 0–44)
AST: 21 U/L (ref 15–41)
Albumin: 3.5 g/dL (ref 3.5–5.0)
Alkaline Phosphatase: 107 U/L (ref 38–126)
Anion gap: 9 (ref 5–15)
BUN: 26 mg/dL — ABNORMAL HIGH (ref 8–23)
CO2: 24 mmol/L (ref 22–32)
Calcium: 9 mg/dL (ref 8.9–10.3)
Chloride: 107 mmol/L (ref 98–111)
Creatinine, Ser: 1.04 mg/dL (ref 0.61–1.24)
GFR calc Af Amer: >60 mL/min (ref >60)
GFR calc non Af Amer: >60 mL/min (ref >60)
Glucose, Bld: 145 mg/dL — ABNORMAL HIGH (ref 70–99)
Potassium: 4.2 mmol/L (ref 3.5–5.1)
Sodium: 140 mmol/L (ref 135–145)
Total Bilirubin: 0.7 mg/dL (ref 0.3–1.2)
Total Protein: 6.4 g/dL — ABNORMAL LOW (ref 6.5–8.1)

## 2019-11-20 LAB — PSA: Prostatic Specific Antigen: 1.09 ng/mL (ref 0.00–4.00)

## 2019-11-20 LAB — HEMOGLOBIN A1C
Hgb A1c MFr Bld: 6.7 % — ABNORMAL HIGH (ref 4.8–5.6)
Mean Plasma Glucose: 145.59 mg/dL

## 2019-11-21 ENCOUNTER — Ambulatory Visit (HOSPITAL_COMMUNITY): Payer: Medicare Other

## 2019-11-21 ENCOUNTER — Encounter (HOSPITAL_COMMUNITY): Payer: Self-pay

## 2019-11-21 ENCOUNTER — Inpatient Hospital Stay (HOSPITAL_COMMUNITY): Payer: Medicare Other

## 2019-11-21 ENCOUNTER — Other Ambulatory Visit (HOSPITAL_COMMUNITY): Payer: Medicare Other

## 2019-11-21 VITALS — BP 134/47 | HR 84 | Temp 97.0°F | Resp 18

## 2019-11-21 DIAGNOSIS — C61 Malignant neoplasm of prostate: Secondary | ICD-10-CM

## 2019-11-21 DIAGNOSIS — Z5111 Encounter for antineoplastic chemotherapy: Secondary | ICD-10-CM | POA: Diagnosis not present

## 2019-11-21 MED ORDER — LEUPROLIDE ACETATE (3 MONTH) 22.5 MG ~~LOC~~ KIT
22.5000 mg | PACK | Freq: Once | SUBCUTANEOUS | Status: AC
  Administered 2019-11-21: 22.5 mg via SUBCUTANEOUS
  Filled 2019-11-21: qty 22.5

## 2019-11-21 MED ORDER — DENOSUMAB 120 MG/1.7ML ~~LOC~~ SOLN
120.0000 mg | Freq: Once | SUBCUTANEOUS | Status: AC
  Administered 2019-11-21: 120 mg via SUBCUTANEOUS
  Filled 2019-11-21: qty 1.7

## 2019-11-21 NOTE — Patient Instructions (Signed)
[place] at [facility] Discharge Instructions  Received Eligard and Delton See injections today. Follow-up as scheduled   Thank you for choosing [place] at [facility] to provide your oncology and hematology care.  To afford each patient quality time with our provider, please arrive at least 15 minutes before your scheduled appointment time.   If you have a lab appointment with the [place] please come in thru the Main Entrance and check in at the main information desk.  You need to re-schedule your appointment should you arrive 10 or more minutes late.  We strive to give you quality time with our providers, and arriving late affects you and other patients whose appointments are after yours.  Also, if you no show three or more times for appointments you may be dismissed from the clinic at the providers discretion.     Again, thank you for choosing [facility].  Our hope is that these requests will decrease the amount of time that you wait before being seen by our physicians.       _____________________________________________________________  Should you have questions after your visit to [facility], please contact our office at [phone] between the hours of 8:00 a.m. and 4:30 p.m.  Voicemails left after 4:00 p.m. will not be returned until the following business day.  For prescription refill requests, have your pharmacy contact our office and allow 72 hours.    Due to Covid, you will need to wear a mask upon entering the hospital. If you do not have a mask, a mask will be given to you at the Main Entrance upon arrival. For doctor visits, patients may have 1 support person with them. For treatment visits, patients can not have anyone with them due to social distancing guidelines and our immunocompromised population.

## 2019-11-21 NOTE — Progress Notes (Signed)
Hunter Jones tolerated Eligard and Xgeva injections well without complaints or incident. Calcium 9 today and pt denied any tooth or jaw pain and no recent or future dental visits. Pt continues to take his Calcium PO as prescribed without issues. VSS Pt discharged self ambulatory in satisfactory  condition

## 2019-11-28 ENCOUNTER — Inpatient Hospital Stay (HOSPITAL_COMMUNITY): Payer: Medicare Other

## 2019-11-28 ENCOUNTER — Other Ambulatory Visit: Payer: Self-pay

## 2019-11-28 DIAGNOSIS — Z5111 Encounter for antineoplastic chemotherapy: Secondary | ICD-10-CM | POA: Diagnosis not present

## 2019-11-28 DIAGNOSIS — C61 Malignant neoplasm of prostate: Secondary | ICD-10-CM

## 2019-11-28 LAB — COMPREHENSIVE METABOLIC PANEL
ALT: 21 U/L (ref 0–44)
AST: 22 U/L (ref 15–41)
Albumin: 3.5 g/dL (ref 3.5–5.0)
Alkaline Phosphatase: 102 U/L (ref 38–126)
Anion gap: 8 (ref 5–15)
BUN: 25 mg/dL — ABNORMAL HIGH (ref 8–23)
CO2: 26 mmol/L (ref 22–32)
Calcium: 9.4 mg/dL (ref 8.9–10.3)
Chloride: 104 mmol/L (ref 98–111)
Creatinine, Ser: 1.24 mg/dL (ref 0.61–1.24)
GFR calc Af Amer: >60 mL/min (ref >60)
GFR calc non Af Amer: >60 mL/min (ref >60)
Glucose, Bld: 226 mg/dL — ABNORMAL HIGH (ref 70–99)
Potassium: 4.5 mmol/L (ref 3.5–5.1)
Sodium: 138 mmol/L (ref 135–145)
Total Bilirubin: 0.5 mg/dL (ref 0.3–1.2)
Total Protein: 6.3 g/dL — ABNORMAL LOW (ref 6.5–8.1)

## 2019-11-28 LAB — CBC WITH DIFFERENTIAL/PLATELET
Abs Immature Granulocytes: 0.02 10*3/uL (ref 0.00–0.07)
Basophils Absolute: 0 10*3/uL (ref 0.0–0.1)
Basophils Relative: 1 %
Eosinophils Absolute: 0.3 10*3/uL (ref 0.0–0.5)
Eosinophils Relative: 5 %
HCT: 38.5 % — ABNORMAL LOW (ref 39.0–52.0)
Hemoglobin: 13.2 g/dL (ref 13.0–17.0)
Immature Granulocytes: 0 %
Lymphocytes Relative: 30 %
Lymphs Abs: 2 10*3/uL (ref 0.7–4.0)
MCH: 34.2 pg — ABNORMAL HIGH (ref 26.0–34.0)
MCHC: 34.3 g/dL (ref 30.0–36.0)
MCV: 99.7 fL (ref 80.0–100.0)
Monocytes Absolute: 0.5 10*3/uL (ref 0.1–1.0)
Monocytes Relative: 7 %
Neutro Abs: 3.8 10*3/uL (ref 1.7–7.7)
Neutrophils Relative %: 57 %
Platelets: 143 10*3/uL — ABNORMAL LOW (ref 150–400)
RBC: 3.86 MIL/uL — ABNORMAL LOW (ref 4.22–5.81)
RDW: 12.9 % (ref 11.5–15.5)
WBC: 6.7 10*3/uL (ref 4.0–10.5)
nRBC: 0 % (ref 0.0–0.2)

## 2019-12-04 ENCOUNTER — Other Ambulatory Visit (HOSPITAL_COMMUNITY): Payer: Self-pay | Admitting: Hematology

## 2019-12-04 DIAGNOSIS — C61 Malignant neoplasm of prostate: Secondary | ICD-10-CM

## 2019-12-05 ENCOUNTER — Inpatient Hospital Stay (HOSPITAL_BASED_OUTPATIENT_CLINIC_OR_DEPARTMENT_OTHER): Payer: Medicare Other | Admitting: Nurse Practitioner

## 2019-12-05 ENCOUNTER — Other Ambulatory Visit: Payer: Self-pay

## 2019-12-05 DIAGNOSIS — Z5111 Encounter for antineoplastic chemotherapy: Secondary | ICD-10-CM | POA: Diagnosis not present

## 2019-12-05 DIAGNOSIS — C61 Malignant neoplasm of prostate: Secondary | ICD-10-CM

## 2019-12-05 NOTE — Assessment & Plan Note (Addendum)
1. 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  Underwent prostatectomy by Dr. Merlene Pulling on 06/27/2014, stage IIIb (PT3PN0), Gleason 4+3 is equal to 7, underwent radiation. - Developed metastatic disease in June 2018.  He was started on Lupron and denosumab.  -CT abdomen pelvis on 06/12/2018 showed worsening of the bone mets, but no visceral metastatic disease. -His bone scan dated 06/12/2018 showed worsening appearance of the scan with new focus of abnormal uptake in the right ninth rib and increase in the extent of the activity in the sacrum and posterior arc of the right sixth rib. -CTAP and bone scan on 05/23/2019 showed improved response compared to bone scan from prior to starting Zytiga - He no longer has hot flashes due to starting the megestrol - He was started on Zytiga 3 tabs 750 mg and prednisone 5 mg daily on 06/16/2018. - Only side effects he is experiencing is fatigue. His potassium is within normal limits. -Zytiga increased to 4 tablets daily the first week of January 2021 due to PSA trending upwards. -PSA on 11/20/2019 was 1.09 -He is receiving Eligard 22.5 mg every 3 months -CT AP and bone scan on 11/06/2019 showed unchanged appearance to diffuse sclerotic bony metastasis.  Similar to slightly increased activity as compared to previous study. -Patient was switched to [facility] which was started on 11/21/2019. -He started having severe mood swings and change in mental status.  Family reports he is not his normal self.  He also reports feeling very drained and felt like he was melting into his bed. -We will stop the [facility] and get an MRI of his brain.  He will follow up in 2 weeks with repeat labs.  2. Bone metastasis: - We will continue his denosumab monthly which he is tolerating well.    - He is also taking calcium and vitamin D supplements twice daily. - He reports he has a yearly  dentist appointment he needs to schedule.  He has no complaints with his teeth or jaw at this time.

## 2019-12-05 NOTE — Patient Instructions (Signed)
[facility] Discharge Instructions  Follow up in 2 months with labs    Thank you for choosing [facility] at [facility] to provide your oncology and hematology care.  To afford each patient quality time with our provider, please arrive at least 15 minutes before your scheduled appointment time.   If you have a lab appointment with the Cancer Center please come in thru the Main Entrance and check in at the main information desk.  You need to re-schedule your appointment should you arrive 10 or more minutes late.  We strive to give you quality time with our providers, and arriving late affects you and other patients whose appointments are after yours.  Also, if you no show three or more times for appointments you may be dismissed from the clinic at the providers discretion.     Again, thank you for choosing [facility].  Our hope is that these requests will decrease the amount of time that you wait before being seen by our physicians.       _____________________________________________________________  Should you have questions after your visit to [facility], please contact our office at [phone] between the hours of 8:00 a.m. and 4:30 p.m.  Voicemails left after 4:00 p.m. will not be returned until the following business day.  For prescription refill requests, have your pharmacy contact our office and allow 72 hours.    Due to Covid, you will need to wear a mask upon entering the hospital. If you do not have a mask, a mask will be given to you at the Main Entrance upon arrival. For doctor visits, patients may have 1 support person with them. For treatment visits, patients can not have anyone with them due to social distancing guidelines and our immunocompromised population.      

## 2019-12-05 NOTE — Progress Notes (Signed)
[place] [place], [place] [address]   CLINIC:  Medical Oncology/Hematology  PCP:  Monico Blitz, [place] [address] [phone]   REASON FOR VISIT: Follow-up for prostate cancer   CURRENT THERAPY: [facility]  BRIEF ONCOLOGIC HISTORY:  Oncology History  Prostate cancer [facility])  02/06/2014 Procedure   Prostate biopsy by Dr. Clyde Lundborg   02/11/2014 Pathology Results   Prostatic adenocarcinoma identified in 5 of 6 prostate specimens with Gleason pattern showing primary pattern-grade 4, secondary pattern grade 3.  Total Gleason score equals 7.  Proportion of prostatic tissue involved by tumor: 70%.   05/03/2014 Imaging   Bone scan- Negative    06/27/2014 Procedure   Radical resection of prostate by Dr. Raynelle Bring   07/29/2014 Pathology Results   1. Prostate, radical resection - PROSTATIC ADENOCARCINOMA, GLEASON SCORE 4 + 3 = 7. - RIGHT AND LEFT PROSTATE INVOLVED. - RIGHT AND LEFT SEMINAL VESICLES INVOLVED BY TUMOR. - EXTRACAPSULAR EXTENSION BY TUMOR. - TUMOR EXTENDS INTO BLADDER NECK TISSUE. - MARGINS NOT INVOLVED. 2. Lymph nodes, regional resection, left pelvic - THREE BENIGN LYMPH NODES (0/3). 3. Lymph nodes, regional resection, right pelvic - FOUR BENIGN LYMPH NODES (0/4).   11/16/2016 Imaging   Bone scan- New areas of increased uptake superolateral to the left orbit (faint), at S1, and in the posterolateral aspect of the left sixth rib.    11/25/2016 Imaging   CT CAP- 1. Status post radical prostatectomy. No findings to suggest local recurrence of disease or definite extraskeletal metastatic disease in the chest, abdomen or pelvis. However, there are several osseous lesions, as above, concerning for metastatic disease to the bones. 2. Hepatic steatosis. 3. Aortic atherosclerosis, in addition to 2 vessel coronary artery disease. Please note that although the presence of coronary artery calcium documents the presence of  coronary artery disease, the severity of this disease and any potential stenosis cannot be assessed on this non-gated CT examination. Assessment for potential risk factor modification, dietary therapy or pharmacologic therapy may be warranted, if clinically indicated. 4. Additional incidental findings, as above.     CANCER STAGING: Cancer Staging Prostate cancer [facility]) Staging form: Prostate, AJCC 7th Edition - Pathologic stage from 07/29/2014: Stage III (T3b, N0, cM0, Gleason 7) - Signed by Baird Cancer, PA-C on 11/09/2016 - Clinical: Stage IV (T3, N0, M1, PSA: Less than 10, Gleason 7) - Signed by [name], MD on 12/21/2016 - Pathologic: No stage assigned - Unsigned    INTERVAL HISTORY:  Mr. Deyton 64 y.o. male returns for routine follow-up for prostate cancer.  Family reports he is a totally different person than before.  He is having major mood swings.  He is having mental status change.  He is getting very mad at a split second.  He is becoming enraged and yelling. Denies any nausea, vomiting, or diarrhea. Denies any new pains. Had not noticed any recent bleeding such as epistaxis, hematuria or hematochezia. Denies recent chest pain on exertion, shortness of breath on minimal exertion, pre-syncopal episodes, or palpitations. Denies any numbness or tingling in hands or feet. Denies any recent fevers, infections, or recent hospitalizations. Patient reports appetite at 100% and energy level at 50%.  He is eating well maintain his weight at this time.    REVIEW OF SYSTEMS:  Review of Systems  Constitutional: Positive for fatigue.  Respiratory: Positive for cough and shortness of breath.   Cardiovascular: Positive for leg swelling.  Gastrointestinal: Positive for nausea.  Neurological: Positive for dizziness and numbness.  All other systems reviewed and are negative.    PAST MEDICAL/SURGICAL HISTORY:  Past Medical History:  Diagnosis Date  . Asthma   . COPD (chronic  obstructive pulmonary disease) ([place])   . Coronary artery disease    DES to mid LAD August 2014 ([facility])  . Diabetes mellitus without complication ([place])   . Essential hypertension   . Hyperthyroidism   . NSTEMI (non-ST elevated myocardial infarction) ([place]) 2014  . Prostate cancer [facility])    Past Surgical History:  Procedure Laterality Date  . APPENDECTOMY    . LYMPHADENECTOMY Bilateral 06/27/2014   Procedure: LYMPHADENECTOMY;  Surgeon: Raynelle Bring, MD;  Location: [facility];  Service: Urology;  Laterality: Bilateral;  . ROBOT ASSISTED LAPAROSCOPIC RADICAL PROSTATECTOMY N/A 06/27/2014   Procedure: ROBOTIC ASSISTED LAPAROSCOPIC RADICAL PROSTATECTOMY LEVEL 3;  Surgeon: Raynelle Bring, MD;  Location: [facility];  Service: Urology;  Laterality: N/A;  . TONSILLECTOMY       SOCIAL HISTORY:  Social History   Socioeconomic History  . Marital status: Married    Spouse name: Not on file  . Number of children: Not on file  . Years of education: Not on file  . Highest education level: Not on file  Occupational History  . Not on file  Tobacco Use  . Smoking status: Former Smoker    Years: 40.00    Types: Cigarettes    Quit date: 06/21/2012    Years since quitting: 7.4  . Smokeless tobacco: Never Used  Vaping Use  . Vaping Use: Never used  Substance and Sexual Activity  . Alcohol use: Yes    Comment: occasional  . Drug use: No  . Sexual activity: Not on file  Other Topics Concern  . Not on file  Social History Narrative  . Not on file   Social Determinants of Health   Financial Resource Strain:   . Difficulty of Paying Living Expenses:   Food Insecurity:   . Worried About Charity fundraiser in the Last Year:   . Arboriculturist in the Last Year:   Transportation Needs:   . Film/video editor (Medical):   [name] Kitchen Lack of Transportation (Non-Medical):   Physical Activity:   . Days of Exercise per Week:   . Minutes of Exercise per Session:   Stress:   . Feeling of Stress :   Social  Connections:   . Frequency of Communication with Friends and Family:   . Frequency of Social Gatherings with Friends and Family:   . Attends Religious Services:   . Active Member of Clubs or Organizations:   . Attends Archivist Meetings:   [name] Kitchen Marital Status:   Intimate Partner Violence:   . Fear of Current or Ex-Partner:   . Emotionally Abused:   [name] Kitchen Physically Abused:   . Sexually Abused:     FAMILY HISTORY:  Family History  Problem Relation Age of Onset  . Dementia Mother   . Thyroid disease Mother   . Kidney Stones Father   . Stroke Brother     CURRENT MEDICATIONS:  Outpatient Encounter Medications as of 12/05/2019  Medication Sig  . aspirin EC 81 MG tablet Take 81 mg by mouth every other day.   [name] Kitchen atorvastatin (LIPITOR) 40 MG tablet TAKE 1 TABLET BY MOUTH AT BEDTIME.  . Calcium-Magnesium-Vitamin D (CALCIUM 500 PO) Take 2 tablets by mouth daily. Per pt he takes 600mg  BID  . Co-Enzyme Q-10 100 MG CAPS  Take 1 capsule by mouth daily.  [name] Kitchen denosumab (XGEVA) 120 MG/1.7ML SOLN injection Inject 120 mg into the skin every 30 (thirty) days.  . fluticasone-salmeterol (ADVAIR HFA) 115-21 MCG/ACT inhaler Inhale 2 puffs into the lungs 2 (two) times daily.   [name] Kitchen KRILL OIL PO Take 1 capsule by mouth daily.   [name] Kitchen Leuprolide Acetate, 3 Month, (LUPRON DEPOT, 85-MONTH, IM) Inject into the muscle every 3 (three) months.  [name] Kitchen losartan (COZAAR) 25 MG tablet Take 25 mg by mouth daily.  . methimazole (TAPAZOLE) 10 MG tablet Take 10 mg by mouth every other day.   . metoprolol succinate (TOPROL-XL) 50 MG 24 hr tablet Take 50 mg by mouth every morning. Take with or immediately following a meal.  . Multiple Vitamin (MULTIVITAMIN) tablet Take 1 tablet by mouth daily.  . Potassium 99 MG TABS Take 1 tablet by mouth daily.  [name] Kitchen triamcinolone cream (KENALOG) 0.1 % Apply 1 application topically as needed.   Gillermina Phy 80 MG tablet TAKE 2 TABLETS (160 MG TOTAL) BY MOUTH DAILY.  [name] Kitchen albuterol (PROVENTIL HFA;VENTOLIN  HFA) 108 (90 BASE) MCG/ACT inhaler Inhale 1-2 puffs into the lungs every 6 (six) hours as needed for wheezing or shortness of breath. (Patient not taking: Reported on 12/05/2019)  . predniSONE (DELTASONE) 5 MG tablet TAKE 1 TABLET BY MOUTH ONCE DAILY WITH BREAKFAST (Patient not taking: Reported on 12/05/2019)   No facility-administered encounter medications on file as of 12/05/2019.    ALLERGIES:  Allergies  Allergen Reactions  . Penicillins Hives     PHYSICAL EXAM:  ECOG Performance status: 1  Vitals:   12/05/19 1345  BP: (!) 135/48  Pulse: 81  Resp: 20  Temp: 97.9 F (36.6 C)  SpO2: 98%   Filed Weights   12/05/19 1345  Weight: 291 lb 11.2 oz (132.3 kg)   Physical Exam Constitutional:      Appearance: Normal appearance. He is normal weight.  Cardiovascular:     Rate and Rhythm: Normal rate and regular rhythm.     Heart sounds: Normal heart sounds.  Pulmonary:     Effort: Pulmonary effort is normal.     Breath sounds: Normal breath sounds.  Abdominal:     General: Bowel sounds are normal.     Palpations: Abdomen is soft.  Musculoskeletal:        General: Normal range of motion.  Skin:    General: Skin is warm.  Neurological:     Mental Status: He is alert and oriented to person, place, and time. Mental status is at baseline.  Psychiatric:        Mood and Affect: Mood normal.        Behavior: Behavior normal.        Thought Content: Thought content normal.        Judgment: Judgment normal.      LABORATORY DATA:  I have reviewed the labs as listed.  CBC    Component Value Date/Time   WBC 6.7 11/28/2019 1245   RBC 3.86 (L) 11/28/2019 1245   HGB 13.2 11/28/2019 1245   HCT 38.5 (L) 11/28/2019 1245   PLT 143 (L) 11/28/2019 1245   MCV 99.7 11/28/2019 1245   MCH 34.2 (H) 11/28/2019 1245   MCHC 34.3 11/28/2019 1245   RDW 12.9 11/28/2019 1245   LYMPHSABS 2.0 11/28/2019 1245   MONOABS 0.5 11/28/2019 1245   EOSABS 0.3 11/28/2019 1245   BASOSABS 0.0 11/28/2019  1245   CMP Latest Ref Rng & Units 11/28/2019 11/20/2019  10/22/2019  Glucose 70 - 99 mg/dL 226(H) 145(H) 200(H)  BUN 8 - 23 mg/dL 25(H) 26(H) 31(H)  Creatinine 0.61 - 1.24 mg/dL 1.24 1.04 1.16  Sodium 135 - 145 mmol/L 138 140 136  Potassium 3.5 - 5.1 mmol/L 4.5 4.2 4.1  Chloride 98 - 111 mmol/L 104 107 106  CO2 22 - 32 mmol/L 26 24 21(L)  Calcium 8.9 - 10.3 mg/dL 9.4 9.0 8.7(L)  Total Protein 6.5 - 8.1 g/dL 6.3(L) 6.4(L) 6.1(L)  Total Bilirubin 0.3 - 1.2 mg/dL 0.5 0.7 0.6  Alkaline Phos 38 - 126 U/L 102 107 89  AST 15 - 41 U/L 22 21 20   ALT 0 - 44 U/L 21 22 21     DIAGNOSTIC IMAGING:  I have independently reviewed the CT scans and discussed with the patient.  ASSESSMENT & PLAN:  Prostate cancer ([place]) 1. 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  Underwent prostatectomy by Dr. Merlene Pulling on 06/27/2014, stage IIIb (PT3PN0), Gleason 4+3 is equal to 7, underwent radiation. - Developed metastatic disease in June 2018.  He was started on Lupron and denosumab.  -CT abdomen pelvis on 06/12/2018 showed worsening of the bone mets, but no visceral metastatic disease. -His bone scan dated 06/12/2018 showed worsening appearance of the scan with new focus of abnormal uptake in the right ninth rib and increase in the extent of the activity in the sacrum and posterior arc of the right sixth rib. -CTAP and bone scan on 05/23/2019 showed improved response compared to bone scan from prior to starting Zytiga - He no longer has hot flashes due to starting the megestrol - He was started on Zytiga 3 tabs 750 mg and prednisone 5 mg daily on 06/16/2018. - Only side effects he is experiencing is fatigue. His potassium is within normal limits. -Zytiga increased to 4 tablets daily the first week of January 2021 due to PSA trending upwards. -PSA on 11/20/2019 was 1.09 -He is receiving Eligard 22.5 mg every 3  months -CT AP and bone scan on 11/06/2019 showed unchanged appearance to diffuse sclerotic bony metastasis.  Similar to slightly increased activity as compared to previous study. -Patient was switched to [facility] which was started on 11/21/2019. -He started having severe mood swings and change in mental status.  Family reports he is not his normal self.  He also reports feeling very drained and felt like he was melting into his bed. -We will stop the [facility] and get an MRI of his brain.  He will follow up in 2 weeks with repeat labs.  2. Bone metastasis: - We will continue his denosumab monthly which he is tolerating well.    - He is also taking calcium and vitamin D supplements twice daily. - He reports he has a yearly dentist appointment he needs to schedule.  He has no complaints with his teeth or jaw at this time.      Orders placed this encounter:  Orders Placed This Encounter  Procedures  . MR Brain W Wo Contrast  . Lactate dehydrogenase  . CBC with Differential/Platelet  . Comprehensive metabolic panel  . PSA      Francene Finders, FNP-C [place] [phone]

## 2019-12-06 NOTE — Progress Notes (Signed)
Cardiology Office Note  Date: 12/07/2019   ID: Hunter Jones, DOB 03-Mar-1956, MRN [id number]  PCP:  Monico Blitz, MD  Cardiologist:  Rozann Lesches, MD Electrophysiologist:  None   Chief Complaint: F/U CAD, HLD, HTN, LE edema  History of Present Illness: Hunter Jones is a 64 y.o. male with a history of CAD (DES to mid LAD 2/2 NSTEMI August 2014), HLD, HTN, LE edema, Prostate CA with metastatic disease, hyperthyroidism,COPD.  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 On 06/22/2019 Results were low risk and did not suggest progressive CAD particularly in the absence of symptoms. LVEF was normal.     He is here today for 64-month follow-up.  He continues with chemotherapy for his prostate CA with metastatic disease.  States his chemotherapeutic agents were recently changed and he had significant issues with tolerating the type of chemotherapy being used.  States this has recently been stopped.  He has a scheduled brain scan ordered by oncology.  He denies any recent progressive anginal symptoms, orthostatic symptoms, CVA or TIA-like symptoms, bleeding, PND, orthopnea.  Denies any claudication-like symptoms, DVT or PE-like symptoms.  He does have chronic lower extremity edema with venous stasis changes to both lower extremities.   Past Medical History:  Diagnosis Date  . Asthma   . COPD (chronic obstructive pulmonary disease) ([place])   . Coronary artery disease    DES to mid LAD August 2014 ([facility])  . Diabetes mellitus without complication ([place])   . Essential hypertension   . Hyperthyroidism   . NSTEMI (non-ST elevated myocardial infarction) ([place]) 2014  . Prostate cancer [facility])     Past Surgical History:  Procedure Laterality Date  . APPENDECTOMY    . LYMPHADENECTOMY Bilateral 06/27/2014   Procedure: LYMPHADENECTOMY;  Surgeon: Raynelle Bring, MD;  Location: [facility];  Service: Urology;  Laterality: Bilateral;  . ROBOT ASSISTED LAPAROSCOPIC RADICAL PROSTATECTOMY N/A  06/27/2014   Procedure: ROBOTIC ASSISTED LAPAROSCOPIC RADICAL PROSTATECTOMY LEVEL 3;  Surgeon: Raynelle Bring, MD;  Location: [facility];  Service: Urology;  Laterality: N/A;  . TONSILLECTOMY      Current Outpatient Medications  Medication Sig Dispense Refill  . albuterol (PROVENTIL HFA;VENTOLIN HFA) 108 (90 BASE) MCG/ACT inhaler Inhale 1-2 puffs into the lungs every 6 (six) hours as needed for wheezing or shortness of breath.     [name] Kitchen aspirin EC 81 MG tablet Take 81 mg by mouth every other day.     [name] Kitchen atorvastatin (LIPITOR) 40 MG tablet TAKE 1 TABLET BY MOUTH AT BEDTIME. 30 tablet 6  . Calcium-Magnesium-Vitamin D (CALCIUM 500 PO) Take 2 tablets by mouth daily. Per pt he takes 600mg  BID    . Co-Enzyme Q-10 100 MG CAPS Take 1 capsule by mouth daily.    [name] Kitchen denosumab (XGEVA) 120 MG/1.7ML SOLN injection Inject 120 mg into the skin every 30 (thirty) days.    . fluticasone-salmeterol (ADVAIR HFA) 115-21 MCG/ACT inhaler Inhale 2 puffs into the lungs 2 (two) times daily.     [name] Kitchen KRILL OIL PO Take 1 capsule by mouth daily.     [name] Kitchen Leuprolide Acetate, 3 Month, (LUPRON DEPOT, 23-MONTH, IM) Inject into the muscle every 3 (three) months.    [name] Kitchen losartan (COZAAR) 25 MG tablet Take 25 mg by mouth daily.    . methimazole (TAPAZOLE) 10 MG tablet Take 10 mg by mouth every other day.     . metoprolol succinate (TOPROL-XL) 50 MG 24 hr tablet Take 50 mg by mouth every morning.  Take with or immediately following a meal.    . Multiple Vitamin (MULTIVITAMIN) tablet Take 1 tablet by mouth daily.    . Potassium 99 MG TABS Take 1 tablet by mouth daily.    . predniSONE (DELTASONE) 5 MG tablet TAKE 1 TABLET BY MOUTH ONCE DAILY WITH BREAKFAST 30 tablet 2  . triamcinolone cream (KENALOG) 0.1 % Apply 1 application topically as needed.      No current facility-administered medications for this visit.   Allergies:  Penicillins   Social History: The patient  reports that he quit smoking about 7 years ago. His smoking use included  cigarettes. He quit after 40.00 years of use. He has never used smokeless tobacco. He reports current alcohol use. He reports that he does not use drugs.   Family History: The patient's family history includes Dementia in his mother; Kidney Stones in his father; Stroke in his brother; Thyroid disease in his mother.   ROS:  Please see the history of present illness. Otherwise, complete review of systems is positive for none.  All other systems are reviewed and negative.   Physical Exam: VS:  BP 132/70   Pulse 87   Ht 5\' 11"  (1.803 m)   Wt 290 lb (131.5 kg)   SpO2 98%   BMI 40.45 kg/m , BMI Body mass index is 40.45 kg/m.  Wt Readings from Last 3 Encounters:  12/07/19 290 lb (131.5 kg)  12/05/19 291 lb 11.2 oz (132.3 kg)  11/08/19 298 lb 6.4 oz (135.4 kg)    General: Patient appears comfortable at rest. Neck: Supple, no elevated JVP or carotid bruits, no thyromegaly. Lungs: Clear to auscultation, nonlabored breathing at rest. Cardiac: Regular rate and rhythm, no S3 or significant systolic murmur, no pericardial rub. Extremities: Mild bilateral lower extremity pitting edema with venous stasis changes, distal pulses 2+. Skin: Warm and dry. Musculoskeletal: No kyphosis. Neuropsychiatric: Alert and oriented x3, affect grossly appropriate.  ECG:    Recent Labwork: 01/09/2019: Magnesium 2.0 11/28/2019: ALT 21; AST 22; BUN 25; Creatinine, Ser 1.24; Hemoglobin 13.2; Platelets 143; Potassium 4.5; Sodium 138  No results found for: CHOL, TRIG, HDL, CHOLHDL, VLDL, LDLCALC, LDLDIRECT  Other Studies Reviewed Today:  NST 06/21/2019 Narrative & Impression   There was no ST segment deviation noted during stress.  Myovue with evidence for a small region of very mild ischemia inferior, inferolateral wall (base, mid) Borderline signficance  Nuclear stress EF: 64%.  Low risk scan      Stress echocardiogram performed May 27, 2017    Cardiac catheterization performed February 04, 2013   CT of abdomen pelvis May 23, 2019 IMPRESSION: 1. Stable diffuse sclerotic bone metastases. 2. No evidence of soft tissue metastatic disease or other acute findings. 3. Tiny nonobstructing right renal calculus. 4. Colonic diverticulosis. No radiographic evidence of diverticulitis. Aortic Atherosclerosis (ICD10-I70.0).  Assessment and Plan:  1. CAD in native artery   2. Mixed hyperlipidemia   3. Essential hypertension   4. Bilateral lower extremity edema    1. CAD in native artery History of CAD (DES to mid LAD 2/2 NSTEMI August 2014).  Denies any recent anginal or exertional symptoms.  Recent nuclear stress test was low risk.  Evidence for small region of very mild ischemia inferior, inferior lateral wall.  Continue aspirin 81 mg.  Toprol-XL 50 mg daily.  2. Mixed hyperlipidemia Last lipid panel at PCP office 04/13/2018 TC 94, TG 8123, HDL 28, LDL 41.  Continue atorvastatin 40 mg p.o. daily.  3.  Essential hypertension Blood pressure today 132/70.  Continue losartan 25 mg daily.  4. Bilateral lower extremity edema Has chronic lower extremity edema with venous stasis changes.  Unchanged  Medication Adjustments/Labs and Tests Ordered: Current medicines are reviewed at length with the patient today.  Concerns regarding medicines are outlined above.   Disposition: Follow-up with Dr. Domenic Polite or APP 6 months  Signed, Levell July, NP 12/07/2019 2:33 PM    [facility] [place], [place], [place] [address] Phone: [phone]; Fax: [phone]

## 2019-12-07 ENCOUNTER — Other Ambulatory Visit: Payer: Self-pay

## 2019-12-07 ENCOUNTER — Encounter: Payer: Self-pay | Admitting: Family Medicine

## 2019-12-07 ENCOUNTER — Ambulatory Visit: Payer: Medicare Other | Admitting: Cardiology

## 2019-12-07 ENCOUNTER — Ambulatory Visit (INDEPENDENT_AMBULATORY_CARE_PROVIDER_SITE_OTHER): Payer: Medicare Other | Admitting: Family Medicine

## 2019-12-07 VITALS — BP 132/70 | HR 87 | Ht 71.0 in | Wt 290.0 lb

## 2019-12-07 DIAGNOSIS — I10 Essential (primary) hypertension: Secondary | ICD-10-CM | POA: Diagnosis not present

## 2019-12-07 DIAGNOSIS — I2510 Atherosclerotic heart disease of native coronary artery without angina pectoris: Secondary | ICD-10-CM

## 2019-12-07 DIAGNOSIS — R600 Localized edema: Secondary | ICD-10-CM | POA: Diagnosis not present

## 2019-12-07 DIAGNOSIS — E782 Mixed hyperlipidemia: Secondary | ICD-10-CM

## 2019-12-07 NOTE — Patient Instructions (Signed)

## 2019-12-08 ENCOUNTER — Ambulatory Visit (HOSPITAL_COMMUNITY)
Admission: RE | Admit: 2019-12-08 | Discharge: 2019-12-08 | Disposition: A | Discharge status: Home / Self Care | Payer: Medicare Other | Source: Ambulatory Visit | Attending: Nurse Practitioner | Admitting: Nurse Practitioner

## 2019-12-08 DIAGNOSIS — C61 Malignant neoplasm of prostate: Secondary | ICD-10-CM

## 2019-12-08 MED ORDER — GADOBUTROL 1 MMOL/ML IV SOLN
10.0000 mL | Freq: Once | INTRAVENOUS | Status: AC | PRN
  Administered 2019-12-08: 10 mL via INTRAVENOUS

## 2019-12-10 ENCOUNTER — Encounter (HOSPITAL_COMMUNITY)
Admission: EM | Disposition: A | Discharge status: Home / Self Care | Payer: Self-pay | Source: Home / Self Care | Attending: Internal Medicine

## 2019-12-10 ENCOUNTER — Other Ambulatory Visit: Payer: Self-pay

## 2019-12-10 ENCOUNTER — Telehealth: Payer: Self-pay | Admitting: Cardiology

## 2019-12-10 ENCOUNTER — Inpatient Hospital Stay (HOSPITAL_COMMUNITY)
Admission: EM | Admit: 2019-12-10 | Discharge: 2019-12-12 | DRG: 247 | Disposition: A | Discharge status: Home / Self Care | Payer: Medicare Other | Attending: Internal Medicine | Admitting: Internal Medicine

## 2019-12-10 ENCOUNTER — Inpatient Hospital Stay (HOSPITAL_COMMUNITY): Payer: Medicare Other

## 2019-12-10 ENCOUNTER — Encounter (HOSPITAL_COMMUNITY): Payer: Self-pay

## 2019-12-10 ENCOUNTER — Ambulatory Visit: Payer: Medicare Other | Admitting: Physician Assistant

## 2019-12-10 ENCOUNTER — Emergency Department (HOSPITAL_COMMUNITY): Payer: Medicare Other

## 2019-12-10 DIAGNOSIS — E0590 Thyrotoxicosis, unspecified without thyrotoxic crisis or storm: Secondary | ICD-10-CM | POA: Diagnosis present

## 2019-12-10 DIAGNOSIS — E785 Hyperlipidemia, unspecified: Secondary | ICD-10-CM | POA: Diagnosis present

## 2019-12-10 DIAGNOSIS — Z955 Presence of coronary angioplasty implant and graft: Secondary | ICD-10-CM

## 2019-12-10 DIAGNOSIS — I2119 ST elevation (STEMI) myocardial infarction involving other coronary artery of inferior wall: Principal | ICD-10-CM | POA: Diagnosis present

## 2019-12-10 DIAGNOSIS — C61 Malignant neoplasm of prostate: Secondary | ICD-10-CM | POA: Diagnosis not present

## 2019-12-10 DIAGNOSIS — Z7951 Long term (current) use of inhaled steroids: Secondary | ICD-10-CM | POA: Diagnosis not present

## 2019-12-10 DIAGNOSIS — I2111 ST elevation (STEMI) myocardial infarction involving right coronary artery: Secondary | ICD-10-CM | POA: Diagnosis not present

## 2019-12-10 DIAGNOSIS — I2510 Atherosclerotic heart disease of native coronary artery without angina pectoris: Secondary | ICD-10-CM

## 2019-12-10 DIAGNOSIS — R7303 Prediabetes: Secondary | ICD-10-CM | POA: Diagnosis not present

## 2019-12-10 DIAGNOSIS — E1159 Type 2 diabetes mellitus with other circulatory complications: Secondary | ICD-10-CM

## 2019-12-10 DIAGNOSIS — Z7952 Long term (current) use of systemic steroids: Secondary | ICD-10-CM | POA: Diagnosis not present

## 2019-12-10 DIAGNOSIS — I10 Essential (primary) hypertension: Secondary | ICD-10-CM | POA: Diagnosis present

## 2019-12-10 DIAGNOSIS — Z79899 Other long term (current) drug therapy: Secondary | ICD-10-CM

## 2019-12-10 DIAGNOSIS — I25119 Atherosclerotic heart disease of native coronary artery with unspecified angina pectoris: Secondary | ICD-10-CM | POA: Diagnosis present

## 2019-12-10 DIAGNOSIS — Z6841 Body Mass Index (BMI) 40.0 and over, adult: Secondary | ICD-10-CM | POA: Diagnosis not present

## 2019-12-10 DIAGNOSIS — Z23 Encounter for immunization: Secondary | ICD-10-CM | POA: Diagnosis present

## 2019-12-10 DIAGNOSIS — Z7982 Long term (current) use of aspirin: Secondary | ICD-10-CM | POA: Diagnosis not present

## 2019-12-10 DIAGNOSIS — Z820 Family history of epilepsy and other diseases of the nervous system: Secondary | ICD-10-CM | POA: Diagnosis not present

## 2019-12-10 DIAGNOSIS — Z20822 Contact with and (suspected) exposure to covid-19: Secondary | ICD-10-CM | POA: Diagnosis present

## 2019-12-10 DIAGNOSIS — Z823 Family history of stroke: Secondary | ICD-10-CM

## 2019-12-10 DIAGNOSIS — Z880 Allergy status to penicillin: Secondary | ICD-10-CM | POA: Diagnosis not present

## 2019-12-10 DIAGNOSIS — I252 Old myocardial infarction: Secondary | ICD-10-CM | POA: Diagnosis not present

## 2019-12-10 DIAGNOSIS — I213 ST elevation (STEMI) myocardial infarction of unspecified site: Secondary | ICD-10-CM

## 2019-12-10 DIAGNOSIS — R079 Chest pain, unspecified: Secondary | ICD-10-CM | POA: Diagnosis present

## 2019-12-10 DIAGNOSIS — Z8349 Family history of other endocrine, nutritional and metabolic diseases: Secondary | ICD-10-CM

## 2019-12-10 DIAGNOSIS — E669 Obesity, unspecified: Secondary | ICD-10-CM | POA: Diagnosis present

## 2019-12-10 DIAGNOSIS — E119 Type 2 diabetes mellitus without complications: Secondary | ICD-10-CM | POA: Diagnosis present

## 2019-12-10 DIAGNOSIS — Z87891 Personal history of nicotine dependence: Secondary | ICD-10-CM

## 2019-12-10 DIAGNOSIS — J449 Chronic obstructive pulmonary disease, unspecified: Secondary | ICD-10-CM | POA: Diagnosis present

## 2019-12-10 DIAGNOSIS — E782 Mixed hyperlipidemia: Secondary | ICD-10-CM | POA: Diagnosis not present

## 2019-12-10 HISTORY — PX: LEFT HEART CATH AND CORONARY ANGIOGRAPHY: CATH118249

## 2019-12-10 HISTORY — PX: CORONARY/GRAFT ACUTE MI REVASCULARIZATION: CATH118305

## 2019-12-10 LAB — CBC
HCT: 37 % — ABNORMAL LOW (ref 39.0–52.0)
Hemoglobin: 12.8 g/dL — ABNORMAL LOW (ref 13.0–17.0)
MCH: 34.3 pg — ABNORMAL HIGH (ref 26.0–34.0)
MCHC: 34.6 g/dL (ref 30.0–36.0)
MCV: 99.2 fL (ref 80.0–100.0)
Platelets: 155 10*3/uL (ref 150–400)
RBC: 3.73 MIL/uL — ABNORMAL LOW (ref 4.22–5.81)
RDW: 12.7 % (ref 11.5–15.5)
WBC: 9.5 10*3/uL (ref 4.0–10.5)
nRBC: 0 % (ref 0.0–0.2)

## 2019-12-10 LAB — TROPONIN I (HIGH SENSITIVITY)
Troponin I (High Sensitivity): >27000 ng/L (ref <18)
Troponin I (High Sensitivity): >27000 ng/L (ref <18)

## 2019-12-10 LAB — BASIC METABOLIC PANEL
Anion gap: 11 (ref 5–15)
BUN: 24 mg/dL — ABNORMAL HIGH (ref 8–23)
CO2: 26 mmol/L (ref 22–32)
Calcium: 9.1 mg/dL (ref 8.9–10.3)
Chloride: 102 mmol/L (ref 98–111)
Creatinine, Ser: 1.16 mg/dL (ref 0.61–1.24)
GFR calc Af Amer: >60 mL/min (ref >60)
GFR calc non Af Amer: >60 mL/min (ref >60)
Glucose, Bld: 152 mg/dL — ABNORMAL HIGH (ref 70–99)
Potassium: 4.4 mmol/L (ref 3.5–5.1)
Sodium: 139 mmol/L (ref 135–145)

## 2019-12-10 LAB — POCT ACTIVATED CLOTTING TIME
Activated Clotting Time: 263 seconds
Activated Clotting Time: 268 seconds
Activated Clotting Time: 268 seconds
Activated Clotting Time: 290 seconds

## 2019-12-10 LAB — ECHOCARDIOGRAM COMPLETE
Height: 71 in
Weight: 4640 oz

## 2019-12-10 LAB — GLUCOSE, CAPILLARY
Glucose-Capillary: 188 mg/dL — ABNORMAL HIGH (ref 70–99)
Glucose-Capillary: 134 mg/dL — ABNORMAL HIGH (ref 70–99)

## 2019-12-10 LAB — SARS CORONAVIRUS 2 BY RT PCR (HOSPITAL ORDER, PERFORMED IN ~~LOC~~ HOSPITAL LAB): SARS Coronavirus 2: NEGATIVE

## 2019-12-10 SURGERY — LEFT HEART CATH AND CORONARY ANGIOGRAPHY
Anesthesia: LOCAL

## 2019-12-10 MED ORDER — INSULIN ASPART 100 UNIT/ML ~~LOC~~ SOLN
0.0000 [IU] | Freq: Three times a day (TID) | SUBCUTANEOUS | Status: DC
  Administered 2019-12-10 – 2019-12-11 (×2): 3 [IU] via SUBCUTANEOUS
  Administered 2019-12-12: 5 [IU] via SUBCUTANEOUS

## 2019-12-10 MED ORDER — ACETAMINOPHEN 325 MG PO TABS: 650.0000 mg | ORAL_TABLET | ORAL | Status: DC | PRN

## 2019-12-10 MED ORDER — HEPARIN SODIUM (PORCINE) 1000 UNIT/ML IJ SOLN
INTRAMUSCULAR | Status: DC | PRN
  Administered 2019-12-10: 8000 [IU] via INTRAVENOUS
  Administered 2019-12-10 (×2): 3000 [IU] via INTRAVENOUS
  Administered 2019-12-10: 5000 [IU] via INTRAVENOUS

## 2019-12-10 MED ORDER — FENTANYL CITRATE (PF) 100 MCG/2ML IJ SOLN
INTRAMUSCULAR | Status: AC
  Filled 2019-12-10: qty 2

## 2019-12-10 MED ORDER — TICAGRELOR 90 MG PO TABS
ORAL_TABLET | ORAL | Status: DC | PRN
  Administered 2019-12-10: 180 mg via ORAL

## 2019-12-10 MED ORDER — SODIUM CHLORIDE 0.9% FLUSH: 3.0000 mL | Freq: Once | INTRAVENOUS | Status: DC

## 2019-12-10 MED ORDER — VERAPAMIL HCL 2.5 MG/ML IV SOLN
INTRAVENOUS | Status: AC
  Filled 2019-12-10: qty 2

## 2019-12-10 MED ORDER — TICAGRELOR 90 MG PO TABS
ORAL_TABLET | ORAL | Status: AC
  Filled 2019-12-10: qty 2

## 2019-12-10 MED ORDER — SODIUM CHLORIDE 0.9 % IV SOLN: INTRAVENOUS | Status: AC

## 2019-12-10 MED ORDER — PREDNISONE 5 MG PO TABS
5.0000 mg | ORAL_TABLET | Freq: Every day | ORAL | Status: DC
  Administered 2019-12-11 – 2019-12-12 (×2): 5 mg via ORAL
  Filled 2019-12-10 (×2): qty 1

## 2019-12-10 MED ORDER — HEPARIN SODIUM (PORCINE) 1000 UNIT/ML IJ SOLN
INTRAMUSCULAR | Status: AC
  Filled 2019-12-10: qty 1

## 2019-12-10 MED ORDER — HEPARIN BOLUS VIA INFUSION: 4000.0000 [IU] | Freq: Once | INTRAVENOUS | Status: DC

## 2019-12-10 MED ORDER — ENOXAPARIN SODIUM 40 MG/0.4ML ~~LOC~~ SOLN
40.0000 mg | SUBCUTANEOUS | Status: DC
  Administered 2019-12-11 – 2019-12-12 (×2): 40 mg via SUBCUTANEOUS
  Filled 2019-12-10 (×2): qty 0.4

## 2019-12-10 MED ORDER — SODIUM CHLORIDE 0.9% FLUSH
3.0000 mL | Freq: Two times a day (BID) | INTRAVENOUS | Status: DC
  Administered 2019-12-10 – 2019-12-11 (×3): 3 mL via INTRAVENOUS

## 2019-12-10 MED ORDER — LIDOCAINE HCL (PF) 1 % IJ SOLN
INTRAMUSCULAR | Status: DC | PRN
  Administered 2019-12-10: 2 mL

## 2019-12-10 MED ORDER — ATORVASTATIN CALCIUM 40 MG PO TABS
40.0000 mg | ORAL_TABLET | Freq: Every day | ORAL | Status: DC
  Administered 2019-12-10 – 2019-12-11 (×2): 40 mg via ORAL
  Filled 2019-12-10 (×2): qty 1

## 2019-12-10 MED ORDER — ONDANSETRON HCL 4 MG/2ML IJ SOLN: 4.0000 mg | Freq: Four times a day (QID) | INTRAMUSCULAR | Status: DC | PRN

## 2019-12-10 MED ORDER — FENTANYL CITRATE (PF) 100 MCG/2ML IJ SOLN
INTRAMUSCULAR | Status: DC | PRN
  Administered 2019-12-10: 25 ug via INTRAVENOUS

## 2019-12-10 MED ORDER — VERAPAMIL HCL 2.5 MG/ML IV SOLN
INTRAVENOUS | Status: DC | PRN
  Administered 2019-12-10: 10 mL via INTRA_ARTERIAL

## 2019-12-10 MED ORDER — SODIUM CHLORIDE 0.9 % IV SOLN: 4.0000 ug/kg/min | INTRAVENOUS | Status: DC

## 2019-12-10 MED ORDER — SODIUM CHLORIDE 0.9 % IV SOLN
INTRAVENOUS | Status: AC | PRN
  Administered 2019-12-10: 50 mL/h via INTRAVENOUS

## 2019-12-10 MED ORDER — MOMETASONE FURO-FORMOTEROL FUM 200-5 MCG/ACT IN AERO
2.0000 | INHALATION_SPRAY | Freq: Two times a day (BID) | RESPIRATORY_TRACT | Status: DC
  Administered 2019-12-10 – 2019-12-12 (×4): 2 via RESPIRATORY_TRACT
  Filled 2019-12-10 (×2): qty 8.8

## 2019-12-10 MED ORDER — ALBUTEROL SULFATE (2.5 MG/3ML) 0.083% IN NEBU: 2.5000 mg | INHALATION_SOLUTION | Freq: Four times a day (QID) | RESPIRATORY_TRACT | Status: DC | PRN

## 2019-12-10 MED ORDER — INSULIN ASPART 100 UNIT/ML ~~LOC~~ SOLN: 0.0000 [IU] | Freq: Every day | SUBCUTANEOUS | Status: DC

## 2019-12-10 MED ORDER — CANGRELOR BOLUS VIA INFUSION
INTRAVENOUS | Status: DC | PRN
  Administered 2019-12-10: 3945 ug via INTRAVENOUS

## 2019-12-10 MED ORDER — HEPARIN (PORCINE) IN NACL 1000-0.9 UT/500ML-% IV SOLN
INTRAVENOUS | Status: DC | PRN
  Administered 2019-12-10 (×2): 500 mL

## 2019-12-10 MED ORDER — METHIMAZOLE 10 MG PO TABS
10.0000 mg | ORAL_TABLET | ORAL | Status: DC
  Administered 2019-12-11: 10 mg via ORAL
  Filled 2019-12-10: qty 1

## 2019-12-10 MED ORDER — ASPIRIN 325 MG PO TABS
325.0000 mg | ORAL_TABLET | Freq: Once | ORAL | Status: AC
  Administered 2019-12-10: 325 mg via ORAL
  Filled 2019-12-10: qty 1

## 2019-12-10 MED ORDER — HEPARIN (PORCINE) 25000 UT/250ML-% IV SOLN: 1300.0000 [IU]/h | INTRAVENOUS | Status: DC

## 2019-12-10 MED ORDER — HEPARIN (PORCINE) IN NACL 1000-0.9 UT/500ML-% IV SOLN
INTRAVENOUS | Status: AC
  Filled 2019-12-10: qty 1000

## 2019-12-10 MED ORDER — IOHEXOL 350 MG/ML SOLN
INTRAVENOUS | Status: DC | PRN
  Administered 2019-12-10: 160 mL

## 2019-12-10 MED ORDER — ASPIRIN EC 81 MG PO TBEC
81.0000 mg | DELAYED_RELEASE_TABLET | ORAL | Status: DC
  Administered 2019-12-11: 81 mg via ORAL
  Filled 2019-12-10: qty 1

## 2019-12-10 MED ORDER — METHIMAZOLE 10 MG PO TABS: 10.0000 mg | ORAL_TABLET | ORAL | Status: DC

## 2019-12-10 MED ORDER — TICAGRELOR 90 MG PO TABS
90.0000 mg | ORAL_TABLET | Freq: Two times a day (BID) | ORAL | Status: DC
  Administered 2019-12-10 – 2019-12-12 (×4): 90 mg via ORAL
  Filled 2019-12-10 (×4): qty 1

## 2019-12-10 MED ORDER — NITROGLYCERIN 1 MG/10 ML FOR IR/CATH LAB
INTRA_ARTERIAL | Status: DC | PRN
  Administered 2019-12-10: 200 ug

## 2019-12-10 MED ORDER — CO-ENZYME Q-10 100 MG PO CAPS: 1.0000 | ORAL_CAPSULE | Freq: Every day | ORAL | Status: DC

## 2019-12-10 MED ORDER — LIDOCAINE HCL (PF) 1 % IJ SOLN
INTRAMUSCULAR | Status: AC
  Filled 2019-12-10: qty 30

## 2019-12-10 MED ORDER — LABETALOL HCL 5 MG/ML IV SOLN: 10.0000 mg | INTRAVENOUS | Status: AC | PRN

## 2019-12-10 MED ORDER — CANGRELOR TETRASODIUM 50 MG IV SOLR
INTRAVENOUS | Status: AC
  Filled 2019-12-10: qty 50

## 2019-12-10 MED ORDER — SODIUM CHLORIDE 0.9 % IV SOLN
INTRAVENOUS | Status: AC | PRN
  Administered 2019-12-10: 4 ug/kg/min via INTRAVENOUS

## 2019-12-10 MED ORDER — HYDRALAZINE HCL 20 MG/ML IJ SOLN: 10.0000 mg | INTRAMUSCULAR | Status: AC | PRN

## 2019-12-10 MED ORDER — METOPROLOL SUCCINATE ER 25 MG PO TB24
25.0000 mg | ORAL_TABLET | Freq: Every day | ORAL | Status: DC
  Administered 2019-12-11 – 2019-12-12 (×2): 25 mg via ORAL
  Filled 2019-12-10 (×2): qty 1

## 2019-12-10 MED ORDER — SODIUM CHLORIDE 0.9% FLUSH: 3.0000 mL | INTRAVENOUS | Status: DC | PRN

## 2019-12-10 MED ORDER — MIDAZOLAM HCL 2 MG/2ML IJ SOLN
INTRAMUSCULAR | Status: DC | PRN
  Administered 2019-12-10 (×2): 1 mg via INTRAVENOUS

## 2019-12-10 MED ORDER — SODIUM CHLORIDE 0.9 % IV SOLN: 250.0000 mL | INTRAVENOUS | Status: DC | PRN

## 2019-12-10 MED ORDER — IOHEXOL 350 MG/ML SOLN
INTRAVENOUS | Status: AC
  Filled 2019-12-10: qty 1

## 2019-12-10 MED ORDER — MIDAZOLAM HCL 2 MG/2ML IJ SOLN
INTRAMUSCULAR | Status: AC
  Filled 2019-12-10: qty 2

## 2019-12-10 MED ORDER — PERFLUTREN LIPID MICROSPHERE
1.0000 mL | INTRAVENOUS | Status: AC | PRN
  Administered 2019-12-10: 2 mL via INTRAVENOUS
  Filled 2019-12-10: qty 10

## 2019-12-10 SURGICAL SUPPLY — 21 items
BALLN SAPPHIRE 2.0X12 (BALLOONS) ×2
BALLN SAPPHIRE 2.5X15 (BALLOONS) ×2
BALLN SAPPHIRE ~~LOC~~ 3.5X18 (BALLOONS) ×2 IMPLANT
BALLOON SAPPHIRE 2.0X12 (BALLOONS) ×1 IMPLANT
BALLOON SAPPHIRE 2.5X15 (BALLOONS) ×1 IMPLANT
CATH EXTRAC PRONTO 5.5F 138CM (CATHETERS) ×2 IMPLANT
CATH INFINITI 5 FR JL3.5 (CATHETERS) ×2 IMPLANT
CATH VISTA GUIDE 6FR JR4 (CATHETERS) ×2 IMPLANT
DEVICE RAD COMP TR BAND LRG (VASCULAR PRODUCTS) ×2 IMPLANT
GLIDESHEATH SLEND SS 6F .021 (SHEATH) ×2 IMPLANT
GUIDEWIRE INQWIRE 1.5J.035X260 (WIRE) ×2 IMPLANT
INQWIRE 1.5J .035X260CM (WIRE) ×4
KIT ENCORE 26 ADVANTAGE (KITS) ×2 IMPLANT
KIT HEART LEFT (KITS) ×2 IMPLANT
PACK CARDIAC CATHETERIZATION (CUSTOM PROCEDURE TRAY) ×2 IMPLANT
SHEATH PROBE COVER 6X72 (BAG) ×2 IMPLANT
STENT RESOLUTE ONYX 3.0X34 (Permanent Stent) ×2 IMPLANT
STENT RESOLUTE ONYX 3.5X18 (Permanent Stent) ×2 IMPLANT
TRANSDUCER W/STOPCOCK (MISCELLANEOUS) ×2 IMPLANT
TUBING CIL FLEX 10 FLL-RA (TUBING) ×2 IMPLANT
WIRE RUNTHROUGH .014X180CM (WIRE) ×2 IMPLANT

## 2019-12-10 NOTE — Progress Notes (Signed)
EKG CRITICAL VALUE     12 lead EKG performed.  Critical value noted.  Juliene Pina, RN notified.   Warren Lacy, CCT 12/10/2019 1:18 PM

## 2019-12-10 NOTE — Brief Op Note (Addendum)
BRIEF CARDIAC CATHETERIZATION NOTE  12/10/2019  12:43 PM  PATIENT:  Hunter Jones  64 y.o. male  PRE-OPERATIVE DIAGNOSIS:  STEMI  POST-OPERATIVE DIAGNOSIS:  STEMI  PROCEDURE:  Procedure(s): LEFT HEART CATH AND CORONARY ANGIOGRAPHY (N/A) Coronary/Graft Acute MI Revascularization (N/A)  SURGEON:  Surgeon(s) and Role:    Nelva Bush, MD - Primary  FINDINGS: 1. Severe single-vessel CAD with thrombotic occlusion of the mid RCA with heavy thrombus burden. 2. Moderate proximal RCA and mid LCx disease. 3. Widely patent LAD stents. 4. Normal LVEDP. 5. Successful PCI to proximal/mid RCA with overlapping Resolute Onyx 3.5 x 18 mm and 3.0 x 34 mm drug-eluting stents with 0% residual stenosis and TIMI-3 flow.  RECOMMENDATIONS: 1. Complete 2 hours of cangrelor. 2. Dual antiplatelet therapy with aspirin and ticagrelor for at least 12 months. 3. Aggressive secondary prevention. 4. Obtain transthoracic echocardiogram to assess LVEF.  Nelva Bush, MD [facility]

## 2019-12-10 NOTE — Telephone Encounter (Signed)
Agree with evaluation in the [facility].  If he is having worsening symptoms over the last 48 hours he may need hospitalization, can be evaluated by our cardiology team here.

## 2019-12-10 NOTE — H&P (Addendum)
Cardiology Admission History and Physical:   Patient ID: GALO SAYED MRN: [id number]; DOB: 1956/02/19   Admission date: 12/10/2019  Primary Care Provider: Monico Blitz, MD [facility] Cardiologist: Rozann Lesches, MD  [place] Electrophysiologist:  None   Chief Complaint: Chest pain  Patient Profile:   Hunter Jones is a 64 y.o. male with coronary artery disease status post PCI to the mid LAD in 01/2013 in the setting of NSTEMI, hypertension, hyperlipidemia, metastatic prostate cancer, COPD, and hypothyroidism, presenting from [facility] emergency department with inferior STEMI.  History of Present Illness:   Hunter Jones developed chest pain 2 nights ago at rest.  He describes a pressure-like sensation in the center of his chest.  It prompted him to go to the [facility] emergency department yesterday, though he left without being seen due to prolonged wait.  Of note, troponin was negative at that time.  He has continued to have chest pain through this morning, which prompted him to go to the [facility] emergency department.  He was found to have inferior ST segment elevation as well as inferior Q waves.  He was transferred to [facility] for emergent cardiac catheterization and possible PCI.  On arrival, Hunter Jones notes that he still has 5/10 chest pressure but otherwise feels well.  He denies having had shortness of breath, palpitations, and lightheadedness.  Changes to his chemotherapy regimen were recently made.  He underwent brain MRI two days ago, which showed no evidence of intracranial metastases.  Cardiac catheterization revealed thrombotic occlusion of the mid RCA as well as moderate disease involving the proximal RCA and LCx.  LAD stents were patent.  Patient underwent successful primary PCI to the proximal/mid RCA with resolution of chest discomfort.  Past Medical History:  Diagnosis Date  . Asthma   . COPD (chronic obstructive pulmonary disease) ([place])   . Coronary  artery disease    DES to mid LAD August 2014 ([facility])  . Diabetes mellitus without complication ([place])   . Essential hypertension   . Hyperthyroidism   . NSTEMI (non-ST elevated myocardial infarction) ([place]) 2014  . Prostate cancer [facility])     Past Surgical History:  Procedure Laterality Date  . APPENDECTOMY    . LYMPHADENECTOMY Bilateral 06/27/2014   Procedure: LYMPHADENECTOMY;  Surgeon: Raynelle Bring, MD;  Location: [facility];  Service: Urology;  Laterality: Bilateral;  . ROBOT ASSISTED LAPAROSCOPIC RADICAL PROSTATECTOMY N/A 06/27/2014   Procedure: ROBOTIC ASSISTED LAPAROSCOPIC RADICAL PROSTATECTOMY LEVEL 3;  Surgeon: Raynelle Bring, MD;  Location: [facility];  Service: Urology;  Laterality: N/A;  . TONSILLECTOMY       Medications Prior to Admission: Prior to Admission medications   Medication Sig Start Date Kassidi Elza Date Taking? Authorizing Provider  albuterol (PROVENTIL HFA;VENTOLIN HFA) 108 (90 BASE) MCG/ACT inhaler Inhale 1-2 puffs into the lungs every 6 (six) hours as needed for wheezing or shortness of breath.     [provider]  aspirin EC 81 MG tablet Take 81 mg by mouth every other day.     [provider]  atorvastatin (LIPITOR) 40 MG tablet TAKE 1 TABLET BY MOUTH AT BEDTIME. 06/14/19   Satira Sark, MD  Calcium-Magnesium-Vitamin D (CALCIUM 500 PO) Take 2 tablets by mouth daily. Per pt he takes 600mg  BID    [provider]  Co-Enzyme Q-10 100 MG CAPS Take 1 capsule by mouth daily.    [provider]  denosumab (XGEVA) 120 MG/1.7ML SOLN injection Inject 120 mg into the skin  every 30 (thirty) days.    [provider]  fluticasone-salmeterol (ADVAIR HFA) 115-21 MCG/ACT inhaler Inhale 2 puffs into the lungs 2 (two) times daily.     [provider]  KRILL OIL PO Take 1 capsule by mouth daily.     [provider]  Leuprolide Acetate, 3 Month, (LUPRON DEPOT, 55-MONTH, IM) Inject into the muscle every 3 (three) months.    [provider]  losartan (COZAAR) 25 MG tablet Take 25 mg by mouth daily.    [provider]  methimazole (TAPAZOLE) 10 MG tablet Take 10 mg by mouth every other day.     [provider]  metoprolol succinate (TOPROL-XL) 50 MG 24 hr tablet Take 50 mg by mouth every morning. Take with or immediately following a meal.    [provider]  Multiple Vitamin (MULTIVITAMIN) tablet Take 1 tablet by mouth daily.    [provider]  Potassium 99 MG TABS Take 1 tablet by mouth daily.    [provider]  predniSONE (DELTASONE) 5 MG tablet TAKE 1 TABLET BY MOUTH ONCE DAILY WITH BREAKFAST 09/26/19   Lockamy, Randi L, NP-C  triamcinolone cream (KENALOG) 0.1 % Apply 1 application topically as needed.  05/14/19   [provider]     Allergies:    Allergies  Allergen Reactions  . Penicillins Hives    Social History:   Social History   Tobacco Use  . Smoking status: Former Smoker    Years: 40.00    Types: Cigarettes    Quit date: 06/21/2012    Years since quitting: 7.4  . Smokeless tobacco: Never Used  Vaping Use  . Vaping Use: Never used  Substance Use Topics  . Alcohol use: Yes    Comment: occasional  . Drug use: No     Family History:   The patient's family history includes Dementia in his mother; Kidney Stones in his father; Stroke in his brother; Thyroid disease in his mother.    ROS:  Please see the history of present illness. All other ROS reviewed and negative.     Physical Exam/Data:   Vitals:   12/10/19 1151 12/10/19 1156 12/10/19 1201 12/10/19 1205  BP: 130/79 132/72 126/73 136/67  Pulse: 85 89 91 90  Resp: 16 16 (!) 37 14  Temp:      TempSrc:      SpO2: 100% 100% 99% 100%  Weight:      Height:        Intake/Output Summary (Last 24 hours) at 12/10/2019 1300 Last data filed at 12/10/2019 1208 Gross per 24 hour  Intake --  Output 150 ml  Net -150 ml   Last 3 Weights 12/10/2019 12/07/2019 12/05/2019  Weight (lbs) 290 lb  290 lb 291 lb 11.2 oz  Weight (kg) 131.543 kg 131.543 kg 132.314 kg     Body mass index is 40.45 kg/m.  General:  Well nourished, well developed, in no acute distress HEENT: normal Lymph: no adenopathy Neck: no gross JVD, though body habitus limits evaluation. Endocrine:  No thryomegaly Vascular: No carotid bruits; FA pulses 2+ bilaterally without bruits  Cardiac:  normal S1, S2; RRR; no murmurs, rubs, or gallops. Lungs: Mildly diminished breath sounds throughout without wheezes or crackles. Abd: soft, nontender, no hepatomegaly  Ext: no lower extremity edema Musculoskeletal:  No deformities, BUE and BLE strength normal and equal Skin: warm and dry  Neuro:  CNs 2-12 intact, no focal abnormalities noted Psych:  Normal affect  EKG:  The ECG that was done at 10:05 AM was personally reviewed and demonstrates normal sinus rhythm with inferior Q waves and ST elevation.  Relevant CV Studies: LHC/PCI (prelim, 12/10/2019): 1. Severe single-vessel CAD with thrombotic occlusion of the mid RCA with heavy thrombus burden. 2. Moderate proximal RCA and mid LCx disease. 3. Widely patent LAD stents. 4. Normal LVEDP. 5. Successful PCI to proximal/mid RCA with overlapping Resolute Onyx 3.5 x 18 mm and 3.0 x 34 mm drug-eluting stents with 0% residual stenosis and TIMI-3 flow.  Laboratory Data:  High Sensitivity Troponin:   Recent Labs  Lab 12/10/19 1007  TROPONINIHS >27,000*      Chemistry Recent Labs  Lab 12/10/19 1007  NA 139  K 4.4  CL 102  CO2 26  GLUCOSE 152*  BUN 24*  CREATININE 1.16  CALCIUM 9.1  GFRNONAA >60  GFRAA >60  ANIONGAP 11    No results for input(s): PROT, ALBUMIN, AST, ALT, ALKPHOS, BILITOT in the last 168 hours. Hematology Recent Labs  Lab 12/10/19 1007  WBC 9.5  RBC 3.73*  HGB 12.8*  HCT 37.0*  MCV 99.2  MCH 34.3*  MCHC 34.6  RDW 12.7  PLT 155   BNPNo results for input(s): BNP, PROBNP in the last 168 hours.  DDimer No results for input(s):  DDIMER in the last 168 hours. [id number]} TIMI Risk Score for ST  Elevation MI:   The patient's TIMI risk score is 3, which indicates a 4.4% risk of all cause mortality at 30 days.       Assessment and Plan:   Inferior STEMI: Patient admitted with inferior STEMI, late presentation with symptoms starting over 2 days ago and initial troponin at any pen greater than 27,000).  Mechanism is consistent with acute plaque rupture and thrombus formation (type I MI) now status post primary PCI to the proximal and mid RCA with resolution of symptoms and restoration of TIMI-3 flow.  Complete 2-hour course of cangrelor.  Dual antiplatelet therapy with aspirin and ticagrelor for at least 12 months.  Admit to to heart ICU for post STEMI care.  Obtain transthoracic echocardiogram.  Due to soft blood pressure during the procedure, I will decrease metoprolol succinate to 25 mg daily, with escalation back to home dose as tolerated.  Trend high-sensitivity troponin I until it has peaked, then stop.  Continue atorvastatin 40 mg daily; check lipid panel with AM labs.  Hypertension: Blood pressure low normal on arrival and during procedure.  Hold losartan for now.  Decrease metoprolol succinate to 25 mg daily.  Metastatic prostate cancer: No acute issues related to this at the moment.  Continue home medications and plan for follow-up with oncology, as previously arranged.  Type 2 diabetes mellitus:  Sliding scale insulin.  Severity of Illness: The appropriate patient status for this patient is INPATIENT. Inpatient status is judged to be reasonable and necessary in order to provide the required intensity of service to ensure the patient's safety. The patient's presenting symptoms, physical exam findings, and initial radiographic and laboratory data in the context of their chronic comorbidities is felt to place them at high risk for further clinical deterioration. Furthermore, it is not anticipated  that the patient will be medically stable for discharge from the hospital within 2 midnights of admission. The following factors support the patient status of inpatient.   " The patient's presenting symptoms include chest pain. " The initial radiographic and laboratory data are worrisome because of Inferior STEMI by EKG and initial  HS-TnI > 27,000. " The chronic co-morbidities include metastatic prostate cancer.   * I certify that at the point of admission it is my clinical judgment that the patient will require inpatient hospital care spanning beyond 2 midnights from the point of admission due to high intensity of service, high risk for further deterioration and high frequency of surveillance required.*    For questions or updates, please contact [place] Please consult [email or web address] for contact info under     Signed, Nelva Bush, MD  12/10/2019 1:00 PM

## 2019-12-10 NOTE — ED Notes (Addendum)
Pt off unit for transport to cath lab by [facility]

## 2019-12-10 NOTE — Progress Notes (Signed)
°  Echocardiogram 2D Echocardiogram has been performed.  Hunter Jones 12/10/2019, 3:59 PM

## 2019-12-10 NOTE — ED Notes (Signed)
CRITICAL VALUE ALERT  Critical Value:  Troponin greater than 27,000  Date & Time Notied:  12/10/2019, 1143  Provider Notified: Dr. Langston Masker  Orders Received/Actions taken: none

## 2019-12-10 NOTE — Progress Notes (Signed)
ANTICOAGULATION CONSULT NOTE - Initial Consult  Pharmacy Consult for Heparin Indication: chest pain/ACS  Allergies  Allergen Reactions  . Penicillins Hives    Patient Measurements: Height: 5\' 11"  (180.3 cm) Weight: 131.5 kg (290 lb) IBW/kg (Calculated) : 75.3 HEPARIN DW (KG): 105.4  Vital Signs: Temp: 98.5 F (36.9 C) (06/28 0948) Temp Source: Oral (06/28 0948) BP: 117/73 (06/28 1006) Pulse Rate: 89 (06/28 1006)  Labs: No results for input(s): HGB, HCT, PLT, APTT, LABPROT, INR, HEPARINUNFRC, HEPRLOWMOCWT, CREATININE, CKTOTAL, CKMB, TROPONINIHS in the last 72 hours.  Estimated Creatinine Clearance: 82.2 mL/min (by C-G formula based on SCr of 1.24 mg/dL).   Medical History: Past Medical History:  Diagnosis Date  . Asthma   . COPD (chronic obstructive pulmonary disease) ([place])   . Coronary artery disease    DES to mid LAD August 2014 ([facility])  . Diabetes mellitus without complication ([place])   . Essential hypertension   . Hyperthyroidism   . NSTEMI (non-ST elevated myocardial infarction) ([place]) 2014  . Prostate cancer ([place])     Medications:  See med rec  Assessment: 64 yo male here with chest pain, found to have inferior ST elevations on EKG with reciprocal changes.  History sounds like angina, highly concerning for ACS/MI. Pharmacy asked to start heparin.  Goal of Therapy:  Heparin level 0.3-0.7 units/ml Monitor platelets by anticoagulation protocol: Yes   Plan:  Give 4000 units bolus x 1 Start heparin infusion at 1300 units/hr Check anti-Xa level in ~6-8 hours and daily while on heparin Continue to monitor H&H and platelets  Isac Sarna, BS [place], BCPS Clinical Pharmacist Pager [phone] 12/10/2019,10:32 AM

## 2019-12-10 NOTE — Telephone Encounter (Signed)
Per phone call from pt's wife-- stated that pt is having pressure in his chest w/ trouble breathing with bilateral arm weakness, went to [facility] on Sunday to be seen and never saw a Dr in 4 hrs while there, requested an apt to be seen today.   Please give a call to [phone]

## 2019-12-10 NOTE — Telephone Encounter (Signed)
Pt c/o chest pressure and SOB  for 2 days. States pressure is worse when walking. Wife states that pt has had increased confusion over the weekend. Pt seen at [facility] on Sun but left before being treated. Pt encouraged to be seen in ER today. Please advise.

## 2019-12-10 NOTE — Progress Notes (Signed)
PHARMACIST - PHYSICIAN ORDER COMMUNICATION  CONCERNING: P&T Medication Policy on Herbal Medications  DESCRIPTION:  This patient's order for:  Co-Q 10 enzyme  has been noted.  This product(s) is classified as an "herbal" or natural product. Due to a lack of definitive safety studies or FDA approval, nonstandard manufacturing practices, plus the potential risk of unknown drug-drug interactions while on inpatient medications, the Pharmacy and Therapeutics Committee does not permit the use of "herbal" or natural products of this type within [facility].   ACTION TAKEN: The pharmacy department is unable to verify this order at this time and your patient has been informed of this safety policy. Please reevaluate patient's clinical condition at discharge and address if the herbal or natural product(s) should be resumed at that time.  Antonietta Jewel, PharmD, [place] Clinical Pharmacist  Phone: [phone] 12/10/2019 1:17 PM  Please check AMION for all [place] phone numbers After 10:00 PM, call [place] [phone]

## 2019-12-10 NOTE — ED Provider Notes (Signed)
[place] Provider Note   CSN: [id number] Arrival date & time: 12/10/19  1165     History Chief Complaint  Patient presents with  . Chest Pain    Hunter Jones is a 64 y.o. male w/ hx of MI s/p stent, former smoker (quit 2014), borderline HTN, obesity, presenting to ED with chest pain  Chest pain for 2 weeks, waxing and waning, worse with exertion/ambulation Felt 2 days ago like he was having a heart attack, nauseated, went to [facility] ED yesterday, wait was too long so he left from the waiting room  Tried to go for a walk today but the pain was too intense Pain is 5/10 currently Did not take aspirin or nitro at home Pain is oressure epigastric, mid sternum, does not radiate, similar to prior MI  NKDA Wife is present at bedside  Cardiology dr Domenic Polite   HPI     Past Medical History:  Diagnosis Date  . Asthma   . COPD (chronic obstructive pulmonary disease) ([place])   . Coronary artery disease    DES to mid LAD August 2014 ([facility])  . Diabetes mellitus without complication ([place])   . Essential hypertension   . Hyperthyroidism   . NSTEMI (non-ST elevated myocardial infarction) ([place]) 2014  . Prostate cancer [facility])     Patient Active Problem List   Diagnosis Date Noted  . Prostate cancer ([place]) 06/27/2014    Past Surgical History:  Procedure Laterality Date  . APPENDECTOMY    . LYMPHADENECTOMY Bilateral 06/27/2014   Procedure: LYMPHADENECTOMY;  Surgeon: Raynelle Bring, MD;  Location: [facility] ORS;  Service: Urology;  Laterality: Bilateral;  . ROBOT ASSISTED LAPAROSCOPIC RADICAL PROSTATECTOMY N/A 06/27/2014   Procedure: ROBOTIC ASSISTED LAPAROSCOPIC RADICAL PROSTATECTOMY LEVEL 3;  Surgeon: Raynelle Bring, MD;  Location: [facility] ORS;  Service: Urology;  Laterality: N/A;  . TONSILLECTOMY         Family History  Problem Relation Age of Onset  . Dementia Mother   . Thyroid disease Mother   . Kidney Stones Father   . Stroke Brother     Social History     Tobacco Use  . Smoking status: Former Smoker    Years: 40.00    Types: Cigarettes    Quit date: 06/21/2012    Years since quitting: 7.4  . Smokeless tobacco: Never Used  Vaping Use  . Vaping Use: Never used  Substance Use Topics  . Alcohol use: Yes    Comment: occasional  . Drug use: No    Home Medications Prior to Admission medications   Medication Sig Start Date End Date Taking? Authorizing Provider  albuterol (PROVENTIL HFA;VENTOLIN HFA) 108 (90 BASE) MCG/ACT inhaler Inhale 1-2 puffs into the lungs every 6 (six) hours as needed for wheezing or shortness of breath.     [provider]  aspirin EC 81 MG tablet Take 81 mg by mouth every other day.     [provider]  atorvastatin (LIPITOR) 40 MG tablet TAKE 1 TABLET BY MOUTH AT BEDTIME. 06/14/19   Satira Sark, MD  Calcium-Magnesium-Vitamin D (CALCIUM 500 PO) Take 2 tablets by mouth daily. Per pt he takes 600mg  BID    [provider]  Co-Enzyme Q-10 100 MG CAPS Take 1 capsule by mouth daily.    [provider]  denosumab (XGEVA) 120 MG/1.7ML SOLN injection Inject 120 mg into the skin every 30 (thirty) days.    [provider]  fluticasone-salmeterol (ADVAIR HFA) 790-38 MCG/ACT inhaler Inhale  2 puffs into the lungs 2 (two) times daily.     [provider]  KRILL OIL PO Take 1 capsule by mouth daily.     [provider]  Leuprolide Acetate, 3 Month, (LUPRON DEPOT, 18-MONTH, IM) Inject into the muscle every 3 (three) months.    [provider]  losartan (COZAAR) 25 MG tablet Take 25 mg by mouth daily.    [provider]  methimazole (TAPAZOLE) 10 MG tablet Take 10 mg by mouth every other day.     [provider]  metoprolol succinate (TOPROL-XL) 50 MG 24 hr tablet Take 50 mg by mouth every morning. Take with or immediately following a meal.    [provider]  Multiple Vitamin (MULTIVITAMIN) tablet Take 1 tablet by mouth daily.     [provider]  Potassium 99 MG TABS Take 1 tablet by mouth daily.    [provider]  predniSONE (DELTASONE) 5 MG tablet TAKE 1 TABLET BY MOUTH ONCE DAILY WITH BREAKFAST 09/26/19   Lockamy, Randi L, NP-C  triamcinolone cream (KENALOG) 0.1 % Apply 1 application topically as needed.  05/14/19   [provider]    Allergies    Penicillins  Review of Systems   Review of Systems  Constitutional: Negative for chills and fever.  HENT: Negative for ear pain and sore throat.   Eyes: Negative for pain and visual disturbance.  Respiratory: Negative for cough and shortness of breath.   Cardiovascular: Positive for chest pain. Negative for palpitations.  Gastrointestinal: Negative for abdominal pain and vomiting.  Genitourinary: Negative for dysuria and hematuria.  Musculoskeletal: Negative for arthralgias and back pain.  Skin: Negative for color change and rash.  Neurological: Negative for syncope and headaches.  Psychiatric/Behavioral: Negative for agitation and confusion.  All other systems reviewed and are negative.   Physical Exam Updated Vital Signs BP 117/73   Pulse 89   Temp 98.5 F (36.9 C) (Oral)   Resp 16   Ht 5\' 11"  (1.803 m)   Wt 131.5 kg   SpO2 97%   BMI 40.45 kg/m   Physical Exam Vitals and nursing note reviewed.  Constitutional:      Appearance: He is well-developed. He is obese.  HENT:     Head: Normocephalic and atraumatic.  Eyes:     Conjunctiva/sclera: Conjunctivae normal.  Cardiovascular:     Rate and Rhythm: Normal rate and regular rhythm.  Pulmonary:     Effort: Pulmonary effort is normal. No respiratory distress.     Breath sounds: Normal breath sounds.  Abdominal:     Palpations: Abdomen is soft.     Tenderness: There is no abdominal tenderness.  Musculoskeletal:     Cervical back: Neck supple.  Skin:    General: Skin is warm and dry.  Neurological:     General: No focal deficit present.     Mental Status: He is alert  and oriented to person, place, and time.  Psychiatric:        Mood and Affect: Mood normal.        Behavior: Behavior normal.     ED Results / Procedures / Treatments   Labs (all labs ordered are listed, but only abnormal results are displayed) Labs Reviewed  SARS CORONAVIRUS 2 BY RT PCR ([place] [facility])  BASIC METABOLIC PANEL  CBC  TROPONIN I (HIGH SENSITIVITY)    EKG None  Radiology MR Brain W Wo Contrast  Result Date: 12/09/2019  CLINICAL DATA:  Prostate cancer.  Changes in mood and mental status. MRI HEAD WITHOUT AND WITH CONTRAST TECHNIQUE: Multiplanar, multiecho pulse sequences of the brain and surrounding structures were obtained without and with intravenous contrast. CONTRAST:  26mL GADAVIST GADOBUTROL 1 MMOL/ML IV SOLN COMPARISON:  None. FINDINGS: Brain: No acute infarction, hemorrhage, hydrocephalus, extra-axial collection or mass lesion. Mild atrophy, typical for age. Few small white matter hyperintensities bilaterally. No enhancing mass lesion in the brain. Negative for metastatic disease. Vascular: Normal arterial flow voids. Hypoplastic right vertebral artery. Skull and upper cervical spine: No focal skeletal lesion. Negative for bony metastatic disease. Sinuses/Orbits: Mucosal edema paranasal sinuses. Secretions in the left nasopharynx which show hyperintense signal on T2 and do not enhance. Therefore this is not felt to be a polyp. Other: None IMPRESSION: No acute abnormality Negative for metastatic disease Mild atrophy and mild white matter changes which are likely due to chronic microvascular ischemia. Electronically Signed   By: Franchot Gallo M.D.   On: 12/09/2019 11:58    Procedures .Critical Care Performed by: Wyvonnia Dusky, MD Authorized by: Wyvonnia Dusky, MD   Critical care provider statement:    Critical care time (minutes):  35   Critical care was necessary to treat or prevent imminent or life-threatening  deterioration of the following conditions:  Circulatory failure   Critical care was time spent personally by me on the following activities:  Discussions with consultants, evaluation of patient's response to treatment, examination of patient, ordering and performing treatments and interventions, ordering and review of laboratory studies, ordering and review of radiographic studies, pulse oximetry, re-evaluation of patient's condition, obtaining history from patient or surrogate and review of old charts Comments:     Code STEMI, heparin, transfer to cath lab   (including critical care time)  Medications Ordered in ED Medications  sodium chloride flush (NS) 0.9 % injection 3 mL (3 mLs Intravenous Not Given 12/10/19 1021)  aspirin tablet 325 mg (325 mg Oral Given 12/10/19 1019)    ED Course  I have reviewed the triage vital signs and the nursing notes.  Pertinent labs & imaging results that were available during my care of the patient were reviewed by me and considered in my medical decision making (see chart for details).  64 yo male here with chest pain, found to have inferior ST elevations on EKG with reciprocal changes.  History sounds like angina, highly concerning for ACS/MI.  Less likely PE, aortic dissection, or PTX in this clinical setting  STEMI alert activated on arrival Labs ordered Heparin, aspirin Hold nitro given inferior location Patient otherwise appears stable and fairly comfortable in the room, non-toxic appearing  Cards consulted, recommended transfer for cath lab  Clinical Course as of Dec 09 1025  Mon Dec 10, 2019  1014 Patient is CODE stemi.  I spoke to Dr Drema Pry of cardiology who agrees with STEMI diagnosis and recommends stat transfer to [facility], giving 325 aspirin here, heparin bolus ordered prior to transfer, EMS en route.  Patient notified and diagnosis explained.  His wife at bedside was also notified.  Both are in agreement for transfer.  Vitals are stable.  Hold on the nitro given inferior elevations   [MT]    Clinical Course User Index [MT] Gunnison Chahal, Carola Rhine, MD    Final Clinical Impression(s) / ED Diagnoses Final diagnoses:  ST elevation myocardial infarction (STEMI), unspecified artery ([place])  Chest pain, unspecified type    Rx / DC Orders ED Discharge Orders  None       Wyvonnia Dusky, MD 12/10/19 1027

## 2019-12-10 NOTE — ED Triage Notes (Signed)
Pt reports having chest pain and generalized weakness since Saturday.  Reports went [facility], had ekg and blood work but left without being seen.  Reports has felt a little better over the weekend but still having pain.

## 2019-12-11 ENCOUNTER — Encounter (HOSPITAL_COMMUNITY): Payer: Self-pay | Admitting: *Deleted

## 2019-12-11 ENCOUNTER — Encounter (HOSPITAL_COMMUNITY): Payer: Self-pay | Admitting: Internal Medicine

## 2019-12-11 DIAGNOSIS — R7303 Prediabetes: Secondary | ICD-10-CM

## 2019-12-11 DIAGNOSIS — E782 Mixed hyperlipidemia: Secondary | ICD-10-CM

## 2019-12-11 LAB — LIPID PANEL
Cholesterol: 68 mg/dL (ref 0–200)
HDL: 28 mg/dL — ABNORMAL LOW (ref >40)
LDL Cholesterol: 22 mg/dL (ref 0–99)
Total CHOL/HDL Ratio: 2.4 RATIO
Triglycerides: 91 mg/dL (ref <150)
VLDL: 18 mg/dL (ref 0–40)

## 2019-12-11 LAB — GLUCOSE, CAPILLARY
Glucose-Capillary: 188 mg/dL — ABNORMAL HIGH (ref 70–99)
Glucose-Capillary: 119 mg/dL — ABNORMAL HIGH (ref 70–99)
Glucose-Capillary: 102 mg/dL — ABNORMAL HIGH (ref 70–99)
Glucose-Capillary: 110 mg/dL — ABNORMAL HIGH (ref 70–99)

## 2019-12-11 LAB — BASIC METABOLIC PANEL
Anion gap: 8 (ref 5–15)
BUN: 20 mg/dL (ref 8–23)
CO2: 23 mmol/L (ref 22–32)
Calcium: 8.2 mg/dL — ABNORMAL LOW (ref 8.9–10.3)
Chloride: 108 mmol/L (ref 98–111)
Creatinine, Ser: 1.17 mg/dL (ref 0.61–1.24)
GFR calc Af Amer: >60 mL/min (ref >60)
GFR calc non Af Amer: >60 mL/min (ref >60)
Glucose, Bld: 135 mg/dL — ABNORMAL HIGH (ref 70–99)
Potassium: 3.6 mmol/L (ref 3.5–5.1)
Sodium: 139 mmol/L (ref 135–145)

## 2019-12-11 LAB — CBC
HCT: 33.4 % — ABNORMAL LOW (ref 39.0–52.0)
Hemoglobin: 11.4 g/dL — ABNORMAL LOW (ref 13.0–17.0)
MCH: 33.5 pg (ref 26.0–34.0)
MCHC: 34.1 g/dL (ref 30.0–36.0)
MCV: 98.2 fL (ref 80.0–100.0)
Platelets: 116 10*3/uL — ABNORMAL LOW (ref 150–400)
RBC: 3.4 MIL/uL — ABNORMAL LOW (ref 4.22–5.81)
RDW: 12.8 % (ref 11.5–15.5)
WBC: 7.4 10*3/uL (ref 4.0–10.5)
nRBC: 0 % (ref 0.0–0.2)

## 2019-12-11 LAB — TROPONIN I (HIGH SENSITIVITY): Troponin I (High Sensitivity): >27000 ng/L (ref <18)

## 2019-12-11 MED ORDER — CHLORHEXIDINE GLUCONATE CLOTH 2 % EX PADS
6.0000 | MEDICATED_PAD | Freq: Every day | CUTANEOUS | Status: DC
  Administered 2019-12-11: 6 via TOPICAL

## 2019-12-11 MED ORDER — PNEUMOCOCCAL VAC POLYVALENT 25 MCG/0.5ML IJ INJ
0.5000 mL | INJECTION | INTRAMUSCULAR | Status: AC
  Administered 2019-12-12: 0.5 mL via INTRAMUSCULAR
  Filled 2019-12-11: qty 0.5

## 2019-12-11 NOTE — Care Management (Addendum)
12-11-19 1823 Case Manager received consult for Jardiance 10 mg qd and Brilinta cost. Benefits check in process and Case Manager will follow for cost. Graves-Bigelow, Ocie Cornfield, RN, BSN Case Manager

## 2019-12-11 NOTE — Progress Notes (Signed)
Progress Note  Patient Name: Hunter Jones Date of Encounter: 12/11/2019  [facility] Cardiologist: Rozann Lesches, MD    Subjective   No chest pain.  Feels much better.  He was telling me about the anginal symptoms he was having leading up to this heart attack.  He made 1 trip to the emergency room and had a negative work-up and was sent home.  Inpatient Medications    Scheduled Meds: . aspirin EC  81 mg Oral QODAY  . atorvastatin  40 mg Oral QHS  . Chlorhexidine Gluconate Cloth  6 each Topical Daily  . enoxaparin (LOVENOX) injection  40 mg Subcutaneous Q24H  . insulin aspart  0-15 Units Subcutaneous TID WC  . insulin aspart  0-5 Units Subcutaneous QHS  . methimazole  10 mg Oral QODAY  . metoprolol succinate  25 mg Oral Daily  . mometasone-formoterol  2 puff Inhalation BID  . [START ON 12/12/2019] pneumococcal 23 valent vaccine  0.5 mL Intramuscular Tomorrow-1000  . predniSONE  5 mg Oral Q breakfast  . sodium chloride flush  3 mL Intravenous Once  . sodium chloride flush  3 mL Intravenous Q12H  . ticagrelor  90 mg Oral BID   Continuous Infusions: . sodium chloride     PRN Meds: sodium chloride, acetaminophen, albuterol, ondansetron (ZOFRAN) IV, sodium chloride flush   Vital Signs    Vitals:   12/11/19 0801 12/11/19 0900 12/11/19 0913 12/11/19 1000  BP:  (!) 96/59 96/69 94/64   Pulse:  85 87 83  Resp:  18  19  Temp: 98.1 F (36.7 C)     TempSrc: Oral     SpO2: 95% 92%  94%  Weight:      Height:        Intake/Output Summary (Last 24 hours) at 12/11/2019 1046 Last data filed at 12/11/2019 0800 Gross per 24 hour  Intake 420 ml  Output 1950 ml  Net -1530 ml   Last 3 Weights 12/10/2019 12/07/2019 12/05/2019  Weight (lbs) 290 lb 290 lb 291 lb 11.2 oz  Weight (kg) 131.543 kg 131.543 kg 132.314 kg      Telemetry    Normal sinus rhythm- Personally Reviewed  ECG    Normal sinus rhythm, mild persistent ST elevation- Personally Reviewed  Physical Exam    GEN: No acute distress.   Neck: No JVD Cardiac: RRR, no murmurs, rubs, or gallops.  Respiratory: Clear to auscultation bilaterally. GI: Soft, nontender, non-distended  MS: No edema; No deformity.  2+ right radial pulse, no hematoma Neuro:  Nonfocal  Psych: Normal affect   Labs    High Sensitivity Troponin:   Recent Labs  Lab 12/10/19 1007 12/10/19 1839 12/10/19 2351  TROPONINIHS >27,000* >27,000* >27,000*      Chemistry Recent Labs  Lab 12/10/19 1007 12/10/19 2351  NA 139 139  K 4.4 3.6  CL 102 108  CO2 26 23  GLUCOSE 152* 135*  BUN 24* 20  CREATININE 1.16 1.17  CALCIUM 9.1 8.2*  GFRNONAA >60 >60  GFRAA >60 >60  ANIONGAP 11 8     Hematology Recent Labs  Lab 12/10/19 1007 12/10/19 2351  WBC 9.5 7.4  RBC 3.73* 3.40*  HGB 12.8* 11.4*  HCT 37.0* 33.4*  MCV 99.2 98.2  MCH 34.3* 33.5  MCHC 34.6 34.1  RDW 12.7 12.8  PLT 155 116*    BNPNo results for input(s): BNP, PROBNP in the last 168 hours.   DDimer No results for input(s): DDIMER in the last  168 hours.   Radiology    CARDIAC CATHETERIZATION  Result Date: 12/10/2019 Conclusions: 1. Severe single-vessel coronary artery disease with thrombotic occlusion of the proximal RCA. 2. Moderate proximal RCA and mid LCx disease. 3. Widely patent overlapping LAD stents. 4. Upper normal left ventricular filling pressure. 5. Successful PCI to proximal/mid RCA using overlapping Resolute Onyx 3.5 x 18 mm and 3.0 x 34 mm drug-eluting stents with 0% residual stenosis and TIMI-3 flow. Recommendations: 1. Complete two hours of cangrelor. 2. Dual antiplatelet therapy with aspirin and ticagrelor for at least 12 months. 3. Aggressive secondary prevention. 4. Obtain echocardiogram to assess left ventricular systolic function. Nelva Bush, MD [facility]   ECHOCARDIOGRAM COMPLETE  Result Date: 12/10/2019    ECHOCARDIOGRAM REPORT   Patient Name:   Hunter Jones Date of Exam: 12/10/2019 Medical Rec #:  [id number]      Height:       71.0 in Accession #:    [id number]    Weight:       290.0 lb Date of Birth:  [birth year]     BSA:          2.469 m Patient Age:    64 years      BP:           118/60 mmHg Patient Gender: M             HR:           96 bpm. Exam Location:  Inpatient Procedure: 2D Echo, Color Doppler, Cardiac Doppler and Intracardiac            Opacification Agent Indications:    ; ; ; I25.5 Ischemic cardiomyopathy; 122-I22.9 Subsequent ST                 elevation (STEM) and non-ST elevation (NSTEMI) myocardial                 infarction  History:        Patient has no prior history of Echocardiogram examinations. CAD                 and Acute MI, COPD; Risk Factors:Hypertension and Diabetes.                 Cancer.  Sonographer:    Roseanna Rainbow RDCS Referring Phys: [id number] CHRISTOPHER END  Sonographer Comments: Technically difficult study due to poor echo windows. Image acquisition challenging due to patient body habitus and restricted mobility. Patient is post stent placement. IMPRESSIONS  1. Left ventricular ejection fraction, by estimation, is 55 to 60%. The left ventricle has normal function. Left ventricular endocardial border not optimally defined to evaluate regional wall motion. There is mild left ventricular hypertrophy. Left ventricular diastolic parameters were normal.  2. Poor acoustic windows limit study, even with Definity RV is not well seen to accurately assess LVEF . Right ventricular systolic function was not well visualized. The right ventricular size is normal.  3. The mitral valve is normal in structure. Trivial mitral valve regurgitation.  4. The aortic valve is normal in structure. Aortic valve regurgitation is not visualized.  5. The inferior vena cava is dilated in size with <50% respiratory variability, suggesting right atrial pressure of 15 mmHg. FINDINGS  Left Ventricle: Left ventricular ejection fraction, by estimation, is 55 to 60%. The left ventricle has normal function. Left ventricular  endocardial border not optimally defined to evaluate regional wall motion. Definity contrast agent was given IV to delineate the left ventricular  endocardial borders. The left ventricular internal cavity size was normal in size. There is mild left ventricular hypertrophy. Left ventricular diastolic parameters were normal. Right Ventricle: Poor acoustic windows limit study, even with Definity RV is not well seen to accurately assess LVEF. The right ventricular size is normal. Right vetricular wall thickness was not assessed. Right ventricular systolic function was not well  visualized. Left Atrium: Left atrial size was normal in size. Right Atrium: Right atrial size was normal in size. Pericardium: Trivial pericardial effusion is present. Mitral Valve: The mitral valve is normal in structure. Trivial mitral valve regurgitation. Tricuspid Valve: The tricuspid valve is normal in structure. Tricuspid valve regurgitation is trivial. Aortic Valve: The aortic valve is normal in structure. Aortic valve regurgitation is not visualized. Pulmonic Valve: The pulmonic valve was normal in structure. Pulmonic valve regurgitation is not visualized. Aorta: The aortic root and ascending aorta are structurally normal, with no evidence of dilitation. Venous: The inferior vena cava is dilated in size with less than 50% respiratory variability, suggesting right atrial pressure of 15 mmHg. IAS/Shunts: No atrial level shunt detected by color flow Doppler.  LEFT VENTRICLE PLAX 2D LVIDd:         4.20 cm      Diastology LVIDs:         3.10 cm      LV e' lateral:   9.57 cm/s LV PW:         1.20 cm      LV E/e' lateral: 8.1 LV IVS:        1.20 cm      LV e' medial:    5.77 cm/s LVOT diam:     2.20 cm      LV E/e' medial:  13.5 LV SV:         67 LV SV Index:   27 LVOT Area:     3.80 cm  LV Volumes (MOD) LV vol d, MOD A2C: 90.4 ml LV vol d, MOD A4C: 128.0 ml LV vol s, MOD A2C: 33.6 ml LV vol s, MOD A4C: 58.8 ml LV SV MOD A2C:     56.8 ml LV SV  MOD A4C:     128.0 ml LV SV MOD BP:      61.1 ml RIGHT VENTRICLE             IVC RV S prime:     11.40 cm/s  IVC diam: 2.40 cm TAPSE (M-mode): 1.8 cm LEFT ATRIUM             Index       RIGHT ATRIUM           Index LA diam:        3.50 cm 1.42 cm/m  RA Area:     11.70 cm LA Vol (A2C):   25.6 ml 10.37 ml/m RA Volume:   23.90 ml  9.68 ml/m LA Vol (A4C):   42.9 ml 17.37 ml/m LA Biplane Vol: 34.8 ml 14.09 ml/m  AORTIC VALVE LVOT Vmax:   138.00 cm/s LVOT Vmean:  87.600 cm/s LVOT VTI:    0.175 m  AORTA Ao Root diam: 3.30 cm Ao Asc diam:  3.10 cm MITRAL VALVE MV Area (PHT): 4.27 cm    SHUNTS MV Decel Time: 178 msec    Systemic VTI:  0.18 m MV E velocity: 77.70 cm/s  Systemic Diam: 2.20 cm MV A velocity: 78.05 cm/s MV E/A ratio:  1.00 Dorris Carnes MD Electronically signed by Dorris Carnes MD  Signature Date/Time: 12/10/2019/6:13:07 PM    Final     Cardiac Studies   I personally reviewed the cath films  Patient Profile     64 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  Continue dual antiplatelet therapy.  He will need aggressive secondary prevention.  Prediabetes: Depending on cost, could consider Jardiance versus Metformin to help reduce likelihood of cardiac events.  Continue high-dose statin.  LDL was 22.  Transfer to telemetry.  Losartan on hold due to borderline blood pressure.  For questions or updates, please contact [place] Please consult [email or web address] for contact info under        Signed, Larae Grooms, MD  12/11/2019, 10:46 AM

## 2019-12-11 NOTE — Progress Notes (Signed)
5284-1324 MI education completed with pt except for ex ed. Discussed importance of brilinta with stent. Reviewed NTG use, MI restrictions, heart healthy and low carb foods, and CRP 2. Referred to [facility] program. Will give ex ed when we walk next visit. Offered to get pt to chair and/or walk. Lunch tray here and he preferred to eat in bed so set tray up. Encouraged him to walk later with staff. Graylon Good RN BSN 12/11/2019 11:36 AM

## 2019-12-11 NOTE — Progress Notes (Signed)
Patient's wife called clinic today to advise that Hunter Jones is in the ICU because he had a heart attack and had to have to cardiac stents placed.  She wanted to know what to do about his upcoming appointments.    I spoke with provider and she wants patient to still keep appointment next week as scheduled.   I called back and spoke with patient and told him to keep his appts the same next week.  We will do labs same day. He verbalizes appreciation and understanding.

## 2019-12-11 NOTE — Progress Notes (Signed)
EKG CRITICAL VALUE     12 lead EKG performed.  Critical value noted.  Carlyn Reichert, RN notified.   Janaia Kozel H, CCT 12/11/2019 7:06 AM

## 2019-12-12 ENCOUNTER — Telehealth: Payer: Self-pay | Admitting: Cardiology

## 2019-12-12 LAB — GLUCOSE, CAPILLARY: Glucose-Capillary: 202 mg/dL — ABNORMAL HIGH (ref 70–99)

## 2019-12-12 MED ORDER — TICAGRELOR 90 MG PO TABS: 90.0000 mg | ORAL_TABLET | Freq: Two times a day (BID) | ORAL | 11 refills | Status: DC

## 2019-12-12 MED ORDER — EMPAGLIFLOZIN 10 MG PO TABS
10.0000 mg | ORAL_TABLET | Freq: Every day | ORAL | Status: DC
  Administered 2019-12-12: 10 mg via ORAL
  Filled 2019-12-12: qty 1

## 2019-12-12 MED ORDER — NITROGLYCERIN 0.4 MG SL SUBL
0.4000 mg | SUBLINGUAL_TABLET | SUBLINGUAL | 12 refills | Status: DC | PRN
Start: 2019-12-12 — End: 2019-12-12

## 2019-12-12 MED ORDER — EMPAGLIFLOZIN 10 MG PO TABS: 10.0000 mg | ORAL_TABLET | Freq: Every day | ORAL | 6 refills | Status: DC

## 2019-12-12 MED ORDER — NITROGLYCERIN 0.4 MG SL SUBL
0.4000 mg | SUBLINGUAL_TABLET | SUBLINGUAL | 12 refills | Status: DC | PRN
Start: 2019-12-12 — End: 2021-08-19

## 2019-12-12 MED FILL — JARDIANCE 10 MG TABLET: 10 | 14 days supply | Qty: 14 | Fill #0

## 2019-12-12 MED FILL — BRILINTA 90 MG TABLET: 90 | 30 days supply | Qty: 60 | Fill #0

## 2019-12-12 NOTE — Discharge Summary (Addendum)
Discharge Summary    Patient ID: CELESTE CANDELAS MRN: [id number]; DOB: 1956/04/15  Admit date: 12/10/2019 Discharge date: 12/12/2019  Primary Care Provider: Monico Blitz, MD  Primary Cardiologist: Rozann Lesches, MD    Discharge Diagnoses    Active Problems:   ST elevation myocardial infarction involving right coronary artery [facility])   STEMI involving right coronary artery ([place])    CAD   DM   HTN   HLD COPD   Diagnostic Studies/Procedures   Echocardiogram 1. Left ventricular ejection fraction, by estimation, is 55 to 60%. The  left ventricle has normal function. Left ventricular endocardial border  not optimally defined to evaluate regional wall motion. There is mild left  ventricular hypertrophy. Left  ventricular diastolic parameters were normal.  2. Poor acoustic windows limit study, even with Definity RV is not well  seen to accurately assess LVEF . Right ventricular systolic function was  not well visualized. The right ventricular size is normal.  3. The mitral valve is normal in structure. Trivial mitral valve  regurgitation.  4. The aortic valve is normal in structure. Aortic valve regurgitation is  not visualized.  5. The inferior vena cava is dilated in size with <50% respiratory  variability, suggesting right atrial pressure of 15 mmHg.   Coronary/Graft Acute MI Revascularization  LEFT HEART CATH AND CORONARY ANGIOGRAPHY  Conclusion  Conclusions: 1. Severe single-vessel coronary artery disease with thrombotic occlusion of the proximal RCA. 2. Moderate proximal RCA and mid LCx disease. 3. Widely patent overlapping LAD stents. 4. Upper normal left ventricular filling pressure. 5. Successful PCI to proximal/mid RCA using overlapping Resolute Onyx 3.5 x 18 mm and 3.0 x 34 mm drug-eluting stents with 0% residual stenosis and TIMI-3 flow.  Recommendations: 1. Complete two hours of cangrelor. 2. Dual antiplatelet therapy with aspirin and ticagrelor for at  least 12 months. 3. Aggressive secondary prevention. 4. Obtain echocardiogram to assess left ventricular systolic function.    History of Present Illness     BILLIE INTRIAGO is a 64 y.o. male with hx of coronary artery disease status post PCI to the mid LAD in 01/2013 in the setting of NSTEMI, hypertension, hyperlipidemia, metastatic prostate cancer, COPD, and hypothyroidism who presented from [facility] emergency department with inferior STEMI.  Mr. Radigan developed chest pain 2 nights ago at rest prior to presentations on 6/28.  He described a pressure-like sensation in the center of his chest.  It prompted him to go to the [facility] emergency department, though he left without being seen due to prolonged wait.  Of note, troponin was negative at that time.  He has continued to have chest pain, which prompted him to go to the [facility] emergency department.  He was found to have inferior ST segment elevation as well as inferior Q waves.  He was transferred to [facility] for emergent cardiac catheterization and possible PCI.  On arrival, Mr. Lieurance notes that he still has 5/10 chest pressure but otherwise feels well.  He denies having had shortness of breath, palpitations, and lightheadedness.  Changes to his chemotherapy regimen were recently made.  He underwent brain MRI two days ago, which showed no evidence of intracranial metastases.   Hospital Course     Consultants: None  1. Inferior STEMI - Cardiac catheterization revealed thrombotic occlusion of the mid RCA as well as moderate disease involving the proximal RCA and LCx.  LAD stents were patent.  Patient underwent successful primary PCI to the proximal/mid RCA with resolution  of chest discomfort. He had no recurrent chest pain. Troponin peaked > 27000. Ambulated well. Echo showed preserved LVEF. DAPT with ASA and Brillinta. Continue BB and statin.   2. Diabetics - Hgb A1c is 6.7 - No diagnosis - Will start Jardiance to reduce likely  hood of cardiac events  3. HLD -12/10/2019: Cholesterol 68; HDL 28; LDL Cholesterol 22; Triglycerides 91; VLDL 18  -Continue statin  4. HTN - Home losartan on hold due to soft blood pressure (resume at follow up if better blood pressure) - Continue BB  Did the patient have an acute coronary syndrome (MI, NSTEMI, STEMI, etc) this admission?:  Yes                               AHA/ACC Clinical Performance & Quality Measures: 1. Aspirin prescribed? - Yes 2. ADP Receptor Inhibitor (Plavix/Clopidogrel, Brilinta/Ticagrelor or Effient/Prasugrel) prescribed (includes medically managed patients)? - Yes 3. Beta Blocker prescribed? - Yes 4. High Intensity Statin (Lipitor 40-80mg  or Crestor 20-40mg ) prescribed? - Yes 5. EF assessed during THIS hospitalization? - Yes 6. For EF <40%, was ACEI/ARB prescribed? - Not Applicable (EF >/= 78%) 7. For EF <40%, Aldosterone Antagonist (Spironolactone or Eplerenone) prescribed? - Yes 8. Cardiac Rehab Phase II ordered (including medically managed patients)? - Yes   Discharge Vitals Blood pressure 110/63, pulse 90, temperature 98.2 F (36.8 C), temperature source Oral, resp. rate 18, height 5\' 11"  (1.803 m), weight 127.6 kg, SpO2 98 %.  Filed Weights   12/10/19 0945 12/12/19 0338  Weight: 131.5 kg 127.6 kg   Physical Exam Constitutional:      Appearance: Normal appearance.  HENT:     Head: Normocephalic and atraumatic.     Nose: Nose normal.  Eyes:     Extraocular Movements: Extraocular movements intact.     Pupils: Pupils are equal, round, and reactive to light.  Cardiovascular:     Rate and Rhythm: Normal rate and regular rhythm.     Comments: R radial cath site without hematoma Pulmonary:     Effort: Pulmonary effort is normal.     Breath sounds: Normal breath sounds.  Abdominal:     General: Abdomen is flat. Bowel sounds are normal.     Palpations: Abdomen is soft.  Musculoskeletal:        General: Normal range of motion.  Skin:     General: Skin is warm and dry.  Neurological:     General: No focal deficit present.     Mental Status: He is alert and oriented to person, place, and time.  Psychiatric:        Mood and Affect: Mood normal.        Behavior: Behavior normal.    Labs & Radiologic Studies    CBC Recent Labs    12/10/19 1007 12/10/19 2351  WBC 9.5 7.4  HGB 12.8* 11.4*  HCT 37.0* 33.4*  MCV 99.2 98.2  PLT 155 469*   Basic Metabolic Panel Recent Labs    12/10/19 1007 12/10/19 2351  NA 139 139  K 4.4 3.6  CL 102 108  CO2 26 23  GLUCOSE 152* 135*  BUN 24* 20  CREATININE 1.16 1.17  CALCIUM 9.1 8.2*  High Sensitivity Troponin:   Recent Labs  Lab 12/10/19 1007 12/10/19 1839 12/10/19 2351  TROPONINIHS >27,000* >27,000* >27,000*    Fasting Lipid Panel Recent Labs    12/10/19 2351  CHOL 68  HDL 28*  LDLCALC 22  TRIG 91  CHOLHDL 2.4  _____________  MR Brain W Wo Contrast  Result Date: 12/09/2019 CLINICAL DATA:  Prostate cancer.  Changes in mood and mental status. MRI HEAD WITHOUT AND WITH CONTRAST TECHNIQUE: Multiplanar, multiecho pulse sequences of the brain and surrounding structures were obtained without and with intravenous contrast. CONTRAST:  67mL GADAVIST GADOBUTROL 1 MMOL/ML IV SOLN COMPARISON:  None. FINDINGS: Brain: No acute infarction, hemorrhage, hydrocephalus, extra-axial collection or mass lesion. Mild atrophy, typical for age. Few small white matter hyperintensities bilaterally. No enhancing mass lesion in the brain. Negative for metastatic disease. Vascular: Normal arterial flow voids. Hypoplastic right vertebral artery. Skull and upper cervical spine: No focal skeletal lesion. Negative for bony metastatic disease. Sinuses/Orbits: Mucosal edema paranasal sinuses. Secretions in the left nasopharynx which show hyperintense signal on T2 and do not enhance. Therefore this is not felt to be a polyp. Other: None IMPRESSION: No acute abnormality Negative for metastatic disease Mild  atrophy and mild white matter changes which are likely due to chronic microvascular ischemia. Electronically Signed   By: Franchot Gallo M.D.   On: 12/09/2019 11:58   CARDIAC CATHETERIZATION  Result Date: 12/10/2019 Conclusions: 1. Severe single-vessel coronary artery disease with thrombotic occlusion of the proximal RCA. 2. Moderate proximal RCA and mid LCx disease. 3. Widely patent overlapping LAD stents. 4. Upper normal left ventricular filling pressure. 5. Successful PCI to proximal/mid RCA using overlapping Resolute Onyx 3.5 x 18 mm and 3.0 x 34 mm drug-eluting stents with 0% residual stenosis and TIMI-3 flow. Recommendations: 1. Complete two hours of cangrelor. 2. Dual antiplatelet therapy with aspirin and ticagrelor for at least 12 months. 3. Aggressive secondary prevention. 4. Obtain echocardiogram to assess left ventricular systolic function. Nelva Bush, MD [facility]   ECHOCARDIOGRAM COMPLETE  Result Date: 12/10/2019    ECHOCARDIOGRAM REPORT   Patient Name:   ISAHI GODWIN Date of Exam: 12/10/2019 Medical Rec #:  [id number]     Height:       71.0 in Accession #:    [id number]    Weight:       290.0 lb Date of Birth:  03-02-1956     BSA:          2.469 m Patient Age:    64 years      BP:           118/60 mmHg Patient Gender: M             HR:           96 bpm. Exam Location:  Inpatient Procedure: 2D Echo, Color Doppler, Cardiac Doppler and Intracardiac            Opacification Agent Indications:    ; ; ; I25.5 Ischemic cardiomyopathy; 122-I22.9 Subsequent ST                 elevation (STEM) and non-ST elevation (NSTEMI) myocardial                 infarction  History:        Patient has no prior history of Echocardiogram examinations. CAD                 and Acute MI, COPD; Risk Factors:Hypertension and Diabetes.                 Cancer.  Sonographer:    Roseanna Rainbow RDCS Referring Phys: [phone] CHRISTOPHER END  Sonographer Comments: Technically difficult study due to poor echo  windows. Image  acquisition challenging due to patient body habitus and restricted mobility. Patient is post stent placement. IMPRESSIONS  1. Left ventricular ejection fraction, by estimation, is 55 to 60%. The left ventricle has normal function. Left ventricular endocardial border not optimally defined to evaluate regional wall motion. There is mild left ventricular hypertrophy. Left ventricular diastolic parameters were normal.  2. Poor acoustic windows limit study, even with Definity RV is not well seen to accurately assess LVEF . Right ventricular systolic function was not well visualized. The right ventricular size is normal.  3. The mitral valve is normal in structure. Trivial mitral valve regurgitation.  4. The aortic valve is normal in structure. Aortic valve regurgitation is not visualized.  5. The inferior vena cava is dilated in size with <50% respiratory variability, suggesting right atrial pressure of 15 mmHg. FINDINGS  Left Ventricle: Left ventricular ejection fraction, by estimation, is 55 to 60%. The left ventricle has normal function. Left ventricular endocardial border not optimally defined to evaluate regional wall motion. Definity contrast agent was given IV to delineate the left ventricular endocardial borders. The left ventricular internal cavity size was normal in size. There is mild left ventricular hypertrophy. Left ventricular diastolic parameters were normal. Right Ventricle: Poor acoustic windows limit study, even with Definity RV is not well seen to accurately assess LVEF. The right ventricular size is normal. Right vetricular wall thickness was not assessed. Right ventricular systolic function was not well  visualized. Left Atrium: Left atrial size was normal in size. Right Atrium: Right atrial size was normal in size. Pericardium: Trivial pericardial effusion is present. Mitral Valve: The mitral valve is normal in structure. Trivial mitral valve regurgitation. Tricuspid Valve: The tricuspid valve is  normal in structure. Tricuspid valve regurgitation is trivial. Aortic Valve: The aortic valve is normal in structure. Aortic valve regurgitation is not visualized. Pulmonic Valve: The pulmonic valve was normal in structure. Pulmonic valve regurgitation is not visualized. Aorta: The aortic root and ascending aorta are structurally normal, with no evidence of dilitation. Venous: The inferior vena cava is dilated in size with less than 50% respiratory variability, suggesting right atrial pressure of 15 mmHg. IAS/Shunts: No atrial level shunt detected by color flow Doppler.  LEFT VENTRICLE PLAX 2D LVIDd:         4.20 cm      Diastology LVIDs:         3.10 cm      LV e' lateral:   9.57 cm/s LV PW:         1.20 cm      LV E/e' lateral: 8.1 LV IVS:        1.20 cm      LV e' medial:    5.77 cm/s LVOT diam:     2.20 cm      LV E/e' medial:  13.5 LV SV:         67 LV SV Index:   27 LVOT Area:     3.80 cm  LV Volumes (MOD) LV vol d, MOD A2C: 90.4 ml LV vol d, MOD A4C: 128.0 ml LV vol s, MOD A2C: 33.6 ml LV vol s, MOD A4C: 58.8 ml LV SV MOD A2C:     56.8 ml LV SV MOD A4C:     128.0 ml LV SV MOD BP:      61.1 ml RIGHT VENTRICLE             IVC RV S prime:     11.40  cm/s  IVC diam: 2.40 cm TAPSE (M-mode): 1.8 cm LEFT ATRIUM             Index       RIGHT ATRIUM           Index LA diam:        3.50 cm 1.42 cm/m  RA Area:     11.70 cm LA Vol (A2C):   25.6 ml 10.37 ml/m RA Volume:   23.90 ml  9.68 ml/m LA Vol (A4C):   42.9 ml 17.37 ml/m LA Biplane Vol: 34.8 ml 14.09 ml/m  AORTIC VALVE LVOT Vmax:   138.00 cm/s LVOT Vmean:  87.600 cm/s LVOT VTI:    0.175 m  AORTA Ao Root diam: 3.30 cm Ao Asc diam:  3.10 cm MITRAL VALVE MV Area (PHT): 4.27 cm    SHUNTS MV Decel Time: 178 msec    Systemic VTI:  0.18 m MV E velocity: 77.70 cm/s  Systemic Diam: 2.20 cm MV A velocity: 78.05 cm/s MV E/A ratio:  1.00 Dorris Carnes MD Electronically signed by Dorris Carnes MD Signature Date/Time: 12/10/2019/6:13:07 PM    Final    Disposition   Pt is being  discharged home today in good condition.  Follow-up Plans & Appointments     Follow-up Information    Verta Ellen., NP. Go on 12/18/2019.   Specialty: Cardiology Why: @8 :30am for hospital follow up  Contact information: [address] [place] [address] [phone]              Discharge Instructions    Amb Referral to Cardiac Rehabilitation   Complete by: As directed    Diagnosis:  Coronary Stents STEMI     After initial evaluation and assessments completed: Virtual Based Care may be provided alone or in conjunction with Phase 2 Cardiac Rehab based on patient barriers.: Yes   Diet - low sodium heart healthy   Complete by: As directed    Discharge instructions   Complete by: As directed    No driving for 2 weeks. No lifting over 10 lbs for 4 weeks. No sexual activity for 4 weeks. You may not return to work until cleared by your cardiologist.  Keep procedure site clean & dry. If you notice increased pain, swelling, bleeding or pus, call/return!  You may shower, but no soaking baths/hot tubs/pools for 1 week.   Increase activity slowly   Complete by: As directed       Discharge Medications   Allergies as of 12/12/2019      Reactions   Penicillins Hives      Medication List    STOP taking these medications   losartan 25 MG tablet Commonly known as: COZAAR   predniSONE 5 MG tablet Commonly known as: DELTASONE     TAKE these medications   albuterol 108 (90 Base) MCG/ACT inhaler Commonly known as: VENTOLIN HFA Inhale 1-2 puffs into the lungs every 6 (six) hours as needed for wheezing or shortness of breath.   aspirin EC 81 MG tablet Take 81 mg by mouth every other day.   atorvastatin 40 MG tablet Commonly known as: LIPITOR TAKE 1 TABLET BY MOUTH AT BEDTIME.   CALCIUM 500 PO Take 2 tablets by mouth daily. Per pt he takes 600mg  BID   Co-Enzyme Q-10 100 MG Caps Take 1 capsule by mouth daily.   empagliflozin 10 MG Tabs tablet Commonly  known as: JARDIANCE Take 1 tablet (10 mg total) by mouth daily.  fluticasone-salmeterol 115-21 MCG/ACT inhaler Commonly known as: ADVAIR HFA Inhale 2 puffs into the lungs 2 (two) times daily.   KRILL OIL PO Take 1 capsule by mouth daily.   LUPRON DEPOT (79-MONTH) IM Inject into the muscle every 3 (three) months.   methimazole 10 MG tablet Commonly known as: TAPAZOLE Take 10 mg by mouth every other day.   metoprolol succinate 50 MG 24 hr tablet Commonly known as: TOPROL-XL Take 50 mg by mouth every morning. Take with or immediately following a meal.   multivitamin tablet Take 1 tablet by mouth daily.   nitroGLYCERIN 0.4 MG SL tablet Commonly known as: Nitrostat Place 1 tablet (0.4 mg total) under the tongue every 5 (five) minutes as needed.   Potassium 99 MG Tabs Take 1 tablet by mouth daily.   ticagrelor 90 MG Tabs tablet Commonly known as: BRILINTA Take 1 tablet (90 mg total) by mouth 2 (two) times daily.   triamcinolone cream 0.1 % Commonly known as: KENALOG Apply 1 application topically daily as needed (For lg rash).   Xgeva 120 MG/1.7ML Soln injection Generic drug: denosumab Inject 120 mg into the skin every 30 (thirty) days.          Outstanding Labs/Studies   None  Duration of Discharge Encounter   Greater than 30 minutes including physician time.  Signed, Leanor Kail, PA 12/12/2019, 9:52 AM  I have examined the patient and reviewed assessment and plan and discussed with patient.  Agree with above as stated.    GEN: Well nourished, well developed, in no acute distress  HEENT: normal  Neck: no JVD, carotid bruits, or masses Cardiac: RRR; no murmurs, rubs, or gallops,no edema  Respiratory:  clear to auscultation bilaterally, normal work of breathing GI: soft, nontender, nondistended,  MS: no deformity or atrophy ; no right radial hematoma, 2+ right radial pulse Skin: warm and dry, no rash Neuro:  Strength and sensation are intact Psych:  euthymic mood, full affect   Continue aggressive secondary prevention.  Dual antiplatelet therapy.  We will also add Jardiance for blood sugar management and reduction of his future cardiac risk.  Both have been found to be affordable with his insurance.  Cardiac rehab also recommended.  Okay for discharge later today.  Larae Grooms

## 2019-12-12 NOTE — Progress Notes (Signed)
CARDIAC REHAB PHASE I   PRE:  Rate/Rhythm: 82 SR  BP:  Supine:   Sitting: 112/65  Standing:    SaO2: 96%RA  MODE:  Ambulation: 500 ft   POST:  Rate/Rhythm: 103 ST  BP:  Supine:   Sitting: 114/76  Standing:    SaO2: 96%RA 0939-1005 Pt walked 500 ft on RA with steady gait and no CP. Tolerated well. Ex ed completed with pt who voiced understanding. No questions re ed done yesterday.   Graylon Good, RN BSN  12/12/2019 10:01 AM

## 2019-12-12 NOTE — Telephone Encounter (Signed)
TOC per Vin Bhagat on 12/18/19 with Levell July at 8:30am

## 2019-12-12 NOTE — Discharge Instructions (Signed)
Radial Site Care  This sheet gives you information about how to care for yourself after your procedure. Your health care provider may also give you more specific instructions. If you have problems or questions, contact your health care provider. What can I expect after the procedure? After the procedure, it is common to have:  Bruising and tenderness at the catheter insertion area. Follow these instructions at home: Medicines  Take over-the-counter and prescription medicines only as told by your health care provider. Insertion site care  Follow instructions from your health care provider about how to take care of your insertion site. Make sure you: ? Wash your hands with soap and water before you change your bandage (dressing). If soap and water are not available, use hand sanitizer. ? Change your dressing as told by your health care provider. ? Leave stitches (sutures), skin glue, or adhesive strips in place. These skin closures may need to stay in place for 2 weeks or longer. If adhesive strip edges start to loosen and curl up, you may trim the loose edges. Do not remove adhesive strips completely unless your health care provider tells you to do that.  Check your insertion site every day for signs of infection. Check for: ? Redness, swelling, or pain. ? Fluid or blood. ? Pus or a bad smell. ? Warmth.  Do not take baths, swim, or use a hot tub until your health care provider approves.  You may shower 24-48 hours after the procedure, or as directed by your health care provider. ? Remove the dressing and gently wash the site with plain soap and water. ? Pat the area dry with a clean towel. ? Do not rub the site. That could cause bleeding.  Do not apply powder or lotion to the site. Activity   For 24 hours after the procedure, or as directed by your health care provider: ? Do not flex or bend the affected arm. ? Do not push or pull heavy objects with the affected arm. ? Do not  drive yourself home from the hospital or clinic. You may drive 24 hours after the procedure unless your health care provider tells you not to. ? Do not operate machinery or power tools.  Do not lift anything that is heavier than 10 lb (4.5 kg), or the limit that you are told, until your health care provider says that it is safe.  Ask your health care provider when it is okay to: ? Return to work or school. ? Resume usual physical activities or sports. ? Resume sexual activity. General instructions  If the catheter site starts to bleed, raise your arm and put firm pressure on the site. If the bleeding does not stop, get help right away. This is a medical emergency.  If you went home on the same day as your procedure, a responsible adult should be with you for the first 24 hours after you arrive home.  Keep all follow-up visits as told by your health care provider. This is important. Contact a health care provider if:  You have a fever.  You have redness, swelling, or yellow drainage around your insertion site. Get help right away if:  You have unusual pain at the radial site.  The catheter insertion area swells very fast.  The insertion area is bleeding, and the bleeding does not stop when you hold steady pressure on the area.  Your arm or hand becomes pale, cool, tingly, or numb. These symptoms may represent a serious problem   that is an emergency. Do not wait to see if the symptoms will go away. Get medical help right away. Call your local emergency services (911 in the [place] Do not drive yourself to the hospital. Summary  After the procedure, it is common to have bruising and tenderness at the site.  Follow instructions from your health care provider about how to take care of your radial site wound. Check the wound every day for signs of infection.  Do not lift anything that is heavier than 10 lb (4.5 kg), or the limit that you are told, until your health care provider says  that it is safe. This information is not intended to replace advice given to you by your health care provider. Make sure you discuss any questions you have with your health care provider. Document Revised: 07/06/2017 Document Reviewed: 07/06/2017 Elsevier Patient Education  2020 [employer]    Heart-Healthy Eating Plan Heart-healthy meal planning includes:  Eating less unhealthy fats.  Eating more healthy fats.  Making other changes in your diet. Talk with your doctor or a diet specialist (dietitian) to create an eating plan that is right for you. What is my plan? Your doctor may recommend an eating plan that includes:  Total fat: ______% or less of total calories a day.  Saturated fat: ______% or less of total calories a day.  Cholesterol: less than _________mg a day. What are tips for following this plan? Cooking Avoid frying your food. Try to bake, boil, grill, or broil it instead. You can also reduce fat by:  Removing the skin from poultry.  Removing all visible fats from meats.  Steaming vegetables in water or broth. Meal planning   At meals, divide your plate into four equal parts: ? Fill one-half of your plate with vegetables and green salads. ? Fill one-fourth of your plate with whole grains. ? Fill one-fourth of your plate with lean protein foods.  Eat 4-5 servings of vegetables per day. A serving of vegetables is: ? 1 cup of raw or cooked vegetables. ? 2 cups of raw leafy greens.  Eat 4-5 servings of fruit per day. A serving of fruit is: ? 1 medium whole fruit. ?  cup of dried fruit. ?  cup of fresh, frozen, or canned fruit. ?  cup of 100% fruit juice.  Eat more foods that have soluble fiber. These are apples, broccoli, carrots, beans, peas, and barley. Try to get 20-30 g of fiber per day.  Eat 4-5 servings of nuts, legumes, and seeds per week: ? 1 serving of dried beans or legumes equals  cup after being cooked. ? 1 serving of nuts is   cup. ? 1 serving of seeds equals 1 tablespoon. General information  Eat more home-cooked food. Eat less restaurant, buffet, and fast food.  Limit or avoid alcohol.  Limit foods that are high in starch and sugar.  Avoid fried foods.  Lose weight if you are overweight.  Keep track of how much salt (sodium) you eat. This is important if you have high blood pressure. Ask your doctor to tell you more about this.  Try to add vegetarian meals each week. Fats  Choose healthy fats. These include olive oil and canola oil, flaxseeds, walnuts, almonds, and seeds.  Eat more omega-3 fats. These include salmon, mackerel, sardines, tuna, flaxseed oil, and ground flaxseeds. Try to eat fish at least 2 times each week.  Check food labels. Avoid foods with trans fats or high amounts of saturated fat.    Limit saturated fats. ? These are often found in animal products, such as meats, butter, and cream. ? These are also found in plant foods, such as palm oil, palm kernel oil, and coconut oil.  Avoid foods with partially hydrogenated oils in them. These have trans fats. Examples are stick margarine, some tub margarines, cookies, crackers, and other baked goods. What foods can I eat? Fruits All fresh, canned (in natural juice), or frozen fruits. Vegetables Fresh or frozen vegetables (raw, steamed, roasted, or grilled). Green salads. Grains Most grains. Choose whole wheat and whole grains most of the time. Rice and pasta, including brown rice and pastas made with whole wheat. Meats and other proteins Lean, well-trimmed beef, veal, pork, and lamb. Chicken and turkey without skin. All fish and shellfish. Wild duck, rabbit, pheasant, and venison. Egg whites or low-cholesterol egg substitutes. Dried beans, peas, lentils, and tofu. Seeds and most nuts. Dairy Low-fat or nonfat cheeses, including ricotta and mozzarella. Skim or 1% milk that is liquid, powdered, or evaporated. Buttermilk that is made with  low-fat milk. Nonfat or low-fat yogurt. Fats and oils Non-hydrogenated (trans-free) margarines. Vegetable oils, including soybean, sesame, sunflower, olive, peanut, safflower, corn, canola, and cottonseed. Salad dressings or mayonnaise made with a vegetable oil. Beverages Mineral water. Coffee and tea. Diet carbonated beverages. Sweets and desserts Sherbet, gelatin, and fruit ice. Small amounts of dark chocolate. Limit all sweets and desserts. Seasonings and condiments All seasonings and condiments. The items listed above may not be a complete list of foods and drinks you can eat. Contact a dietitian for more options. What foods should I avoid? Fruits Canned fruit in heavy syrup. Fruit in cream or butter sauce. Fried fruit. Limit coconut. Vegetables Vegetables cooked in cheese, cream, or butter sauce. Fried vegetables. Grains Breads that are made with saturated or trans fats, oils, or whole milk. Croissants. Sweet rolls. Donuts. High-fat crackers, such as cheese crackers. Meats and other proteins Fatty meats, such as hot dogs, ribs, sausage, bacon, rib-eye roast or steak. High-fat deli meats, such as salami and bologna. Caviar. Domestic duck and goose. Organ meats, such as liver. Dairy Cream, sour cream, cream cheese, and creamed cottage cheese. Whole-milk cheeses. Whole or 2% milk that is liquid, evaporated, or condensed. Whole buttermilk. Cream sauce or high-fat cheese sauce. Yogurt that is made from whole milk. Fats and oils Meat fat, or shortening. Cocoa butter, hydrogenated oils, palm oil, coconut oil, palm kernel oil. Solid fats and shortenings, including bacon fat, salt pork, lard, and butter. Nondairy cream substitutes. Salad dressings with cheese or sour cream. Beverages Regular sodas and juice drinks with added sugar. Sweets and desserts Frosting. Pudding. Cookies. Cakes. Pies. Milk chocolate or white chocolate. Buttered syrups. Full-fat ice cream or ice cream drinks. The items  listed above may not be a complete list of foods and drinks to avoid. Contact a dietitian for more information. Summary  Heart-healthy meal planning includes eating less unhealthy fats, eating more healthy fats, and making other changes in your diet.  Eat a balanced diet. This includes fruits and vegetables, low-fat or nonfat dairy, lean protein, nuts and legumes, whole grains, and heart-healthy oils and fats. This information is not intended to replace advice given to you by your health care provider. Make sure you discuss any questions you have with your health care provider. Document Revised: 08/04/2017 Document Reviewed: 07/08/2017 Elsevier Patient Education  2020 Elsevier Inc.  

## 2019-12-12 NOTE — TOC Benefit Eligibility Note (Signed)
Transition of Care [facility]) Benefit Eligibility Note    Patient Details  Name: Hunter Jones MRN: [id number] Date of Birth: 20-Jul-1955   Medication/Dose: JARDIANCE  10 MG  DAILY  Covered?: Yes  Tier: 3 Drug  Prescription Coverage Preferred Pharmacy: [facility]  Spoke with Person/Company/Phone Number:: [place]  @ [employer] # [phone]  Co-Pay: $28.98  Prior Approval: No  Deductible: Met  Additional Notes: BRILINTA  90 MG BID COVER- YES CO-PAY-$21.39 TIER-4 DRUG P/A-NO    Memory Argue Phone Number: 12/12/2019, 9:35 AM

## 2019-12-13 ENCOUNTER — Telehealth: Payer: Self-pay

## 2019-12-13 NOTE — Telephone Encounter (Signed)
**Note De-Identified [facility] Dexter Obfuscation** We received a Brilinta PA request from [facility] Parthiv Mucci fax. I did the PA through covermymeds and received the following message:   Shawna Orleans KeyMike Gip - Rx #: [id number]  Outcome  The patient currently has access to the requested medication and a Prior Authorization is not needed for the patient/medication.  DrugBrilinta 90MG  tablets  Form [employer] Electronic PA Form (2017 NCPDP)  Original Claim Info70   I did contact [facility] at [phone] and advised Lilia Pro that this Brilinta PA is not required per the pts plan. She thanked me for letting them know.

## 2019-12-14 NOTE — Telephone Encounter (Signed)
Pt denies any symptoms says he is feeling well and is getting bored at home - wife is with him at home - denies any questions/concerns at this time - confirmed 7/6 appt in [place] with Katina Dung, NP

## 2019-12-17 NOTE — Progress Notes (Addendum)
Cardiology Office Note  Date: 12/18/2019   ID: MARTAVIOUS HARTEL, DOB [birth year], MRN [id number]  PCP:  Monico Blitz, MD  Cardiologist:  Rozann Lesches, MD Electrophysiologist:  None   Chief Complaint: F/U CAD, HLD, HTN, LE edema  History of Present Illness: VADIM CENTOLA is a 64 y.o. male with a history of CAD (DES to mid LAD 2/2 NSTEMI August 2014), (recent STEMI  12/10/2019 with DES prox/mid RCA), HLD, HTN, LE edema, Prostate CA with metastatic disease, hyperthyroidism,COPD.  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 On 06/22/2019 Results were low risk and did not suggest progressive CAD particularly in the absence of symptoms. LVEF was normal.     Recently saw me on 12/07/2019.  He continued with chemotherapy for his prostate CA with metastatic disease.  Stated his chemotherapeutic agents were recently changed and he had significant issues with tolerating the type of chemotherapy being used.  Stated this had recently been stopped.  He had a scheduled brain scan ordered by oncology.  He denied any recent progressive anginal symptoms, orthostatic symptoms, CVA or TIA-like symptoms, bleeding, PND, orthopnea.  Denied any claudication-like symptoms, DVT or PE-like symptoms.  He did have chronic lower extremity edema with venous stasis changes to both lower extremities.  On 12/10/2019 patient presented to [facility] emergency department with complaints of chest pain he was found to have inferior ST elevation as well as inferior Q waves.  He was transferred to [facility] for emergent cardiac catheterization.  On arrival to [facility] patient continued to have 5/10 chest pressure.  He denies any shortness of breath, palpitations or lightheadedness.  Cardiac catheterization showed thrombotic occlusion of mid RCA with moderate disease involving proximal RCA and LCx.  LAD stents were patent.  Successful primary PCI to proximal/mid RCA with resolution of chest discomfort.  No recurrent chest  pain.  Troponin peaked at greater than 27,000.  Echo showed preserved LVEF.  DAPT and with aspirin and Brilinta started.  Beta-blocker and statin were continued.  Jardiance was started to reduce likelihood of cardiac events.  His home losartan was held due to soft blood pressures.  To be resumed at follow-up if blood pressure was better.  Prior to [facility] ED visit on 12/09/2019 at 0338 he presented to [facility] ER c/o of chest pressure and overall weakness after getting up to urinate. He left after 4 hours.  Patient is here for follow-up status post recent hospital admission with inferior STEMI and cardiac catheterization with stent placement to the proximal/mid RCA with resolution of chest discomfort.  Patient states today he feels unusually tired since the hospitalizationI.  States he feels a little dizzy and his blood pressure has been a little lower recently.  He denies any other symptoms such as CVA or TIA palpitations, bleeding, PND, orthopnea, claudication, DVT or PE-like symptoms.  Has chronic lower extremity edema.  We discussed the fact that cancer predisposes you to a hyper coagulable state and the fact that the coronary occlusion was thrombotic.  He is also on a chemotherapeutic agent which increases risk of thrombosis.  Patient is going to start cardiac rehab soon.  Past Medical History:  Diagnosis Date   Asthma    COPD (chronic obstructive pulmonary disease) ([place])    Coronary artery disease    DES to mid LAD August 2014 ([facility])   Diabetes mellitus without complication [facility])    Essential hypertension    Hyperthyroidism    NSTEMI (non-ST elevated myocardial  infarction) [facility]) 2014   Prostate cancer [facility])     Past Surgical History:  Procedure Laterality Date   APPENDECTOMY     CORONARY/GRAFT ACUTE MI REVASCULARIZATION N/A 12/10/2019   Procedure: Coronary/Graft Acute MI Revascularization;  Surgeon: Nelva Bush, MD;  Location: [place] CV LAB;  Service: Cardiovascular;   Laterality: N/A;   LEFT HEART CATH AND CORONARY ANGIOGRAPHY N/A 12/10/2019   Procedure: LEFT HEART CATH AND CORONARY ANGIOGRAPHY;  Surgeon: Nelva Bush, MD;  Location: [place] CV LAB;  Service: Cardiovascular;  Laterality: N/A;   LYMPHADENECTOMY Bilateral 06/27/2014   Procedure: LYMPHADENECTOMY;  Surgeon: Raynelle Bring, MD;  Location: [facility];  Service: Urology;  Laterality: Bilateral;   ROBOT ASSISTED LAPAROSCOPIC RADICAL PROSTATECTOMY N/A 06/27/2014   Procedure: ROBOTIC ASSISTED LAPAROSCOPIC RADICAL PROSTATECTOMY LEVEL 3;  Surgeon: Raynelle Bring, MD;  Location: [facility];  Service: Urology;  Laterality: N/A;   TONSILLECTOMY      Current Outpatient Medications  Medication Sig Dispense Refill   albuterol (PROVENTIL HFA;VENTOLIN HFA) 108 (90 BASE) MCG/ACT inhaler Inhale 1-2 puffs into the lungs every 6 (six) hours as needed for wheezing or shortness of breath.      aspirin EC 81 MG tablet Take 81 mg by mouth daily.      atorvastatin (LIPITOR) 40 MG tablet TAKE 1 TABLET BY MOUTH AT BEDTIME. (Patient taking differently: Take 40 mg by mouth at bedtime. ) 30 tablet 6   Calcium-Magnesium-Vitamin D (CALCIUM 500 PO) Take 2 tablets by mouth daily. Per pt he takes 600mg  BID     Co-Enzyme Q-10 100 MG CAPS Take 1 capsule by mouth daily.     denosumab (XGEVA) 120 MG/1.7ML SOLN injection Inject 120 mg into the skin every 30 (thirty) days.     empagliflozin (JARDIANCE) 10 MG TABS tablet Take 1 tablet (10 mg total) by mouth daily. 30 tablet 6   fluticasone-salmeterol (ADVAIR HFA) 115-21 MCG/ACT inhaler Inhale 2 puffs into the lungs 2 (two) times daily.      KRILL OIL PO Take 1 capsule by mouth daily.      Leuprolide Acetate, 3 Month, (LUPRON DEPOT, 19-MONTH, IM) Inject into the muscle every 3 (three) months.     methimazole (TAPAZOLE) 10 MG tablet Take 10 mg by mouth every other day.      Multiple Vitamin (MULTIVITAMIN) tablet Take 1 tablet by mouth daily.     nitroGLYCERIN (NITROSTAT) 0.4  MG SL tablet Place 1 tablet (0.4 mg total) under the tongue every 5 (five) minutes as needed. 25 tablet 12   Potassium 99 MG TABS Take 1 tablet by mouth daily.     ticagrelor (BRILINTA) 90 MG TABS tablet Take 1 tablet (90 mg total) by mouth 2 (two) times daily. 60 tablet 11   triamcinolone cream (KENALOG) 0.1 % Apply 1 application topically daily as needed (For lg rash).      metoprolol succinate (TOPROL XL) 25 MG 24 hr tablet Take 1 tablet (25 mg total) by mouth daily. 90 tablet 1   No current facility-administered medications for this visit.   Allergies:  Penicillins   Social History: The patient  reports that he quit smoking about 7 years ago. His smoking use included cigarettes. He quit after 40.00 years of use. He has never used smokeless tobacco. He reports current alcohol use. He reports that he does not use drugs.   Family History: The patient's family history includes Dementia in his mother; Kidney Stones in his father; Stroke in his brother; Thyroid disease in his  mother.   ROS:  Please see the history of present illness. Otherwise, complete review of systems is positive for none.  All other systems are reviewed and negative.   Physical Exam: VS:  BP 104/60    Pulse 76    Ht 5\' 11"  (1.803 m)    Wt 274 lb 12.8 oz (124.6 kg)    SpO2 98%    BMI 38.33 kg/m , BMI Body mass index is 38.33 kg/m.  Wt Readings from Last 3 Encounters:  12/18/19 274 lb 12.8 oz (124.6 kg)  12/12/19 281 lb 6.4 oz (127.6 kg)  12/07/19 290 lb (131.5 kg)    General: Patient appears comfortable at rest. Neck: Supple, no elevated JVP or carotid bruits, no thyromegaly. Lungs: Clear to auscultation, nonlabored breathing at rest. Cardiac: Regular rate and rhythm, no S3 or significant systolic murmur, no pericardial rub. Extremities: Mild bilateral lower extremity pitting edema with venous stasis changes, distal pulses 2+. Skin: Warm and dry.  Right radial catheter site clean and dry with 2+  pulse. Musculoskeletal: No kyphosis. Neuropsychiatric: Alert and oriented x3, affect grossly appropriate.  ECG:  EKG 12/10/2019.  Normal sinus rhythm with acute inferior infarct possibly acute  Recent Labwork: 01/09/2019: Magnesium 2.0 11/28/2019: ALT 21; AST 22 12/10/2019: BUN 20; Creatinine, Ser 1.17; Hemoglobin 11.4; Platelets 116; Potassium 3.6; Sodium 139     Component Value Date/Time   CHOL 68 12/10/2019 2351   TRIG 91 12/10/2019 2351   HDL 28 (L) 12/10/2019 2351   CHOLHDL 2.4 12/10/2019 2351   VLDL 18 12/10/2019 2351   LDLCALC 22 12/10/2019 2351    Other Studies Reviewed Today:   Echocardiogram 12/10/2019 1. Left ventricular ejection fraction, by estimation, is 55 to 60%. The  left ventricle has normal function. Left ventricular endocardial border  not optimally defined to evaluate regional wall motion. There is mild left  ventricular hypertrophy. Left  ventricular diastolic parameters were normal.  2. Poor acoustic windows limit study, even with Definity RV is not well  seen to accurately assess LVEF . Right ventricular systolic function was  not well visualized. The right ventricular size is normal.  3. The mitral valve is normal in structure. Trivial mitral valve  regurgitation.  4. The aortic valve is normal in structure. Aortic valve regurgitation is  not visualized.  5. The inferior vena cava is dilated in size with <50% respiratory  variability, suggesting right atrial pressure of 15 mmHg.   Coronary/Graft Acute MI Revascularization  LEFT HEART CATH AND CORONARY ANGIOGRAPHY  Conclusion  Conclusions: 1. Severe single-vessel coronary artery disease with thrombotic occlusion of the proximal RCA. 2. Moderate proximal RCA and mid LCx disease. 3. Widely patent overlapping LAD stents. 4. Upper normal left ventricular filling pressure. 5. Successful PCI to proximal/mid RCA using overlapping Resolute Onyx 3.5 x 18 mm and 3.0 x 34 mm drug-eluting stents with 0%  residual stenosis and TIMI-3 flow.  Recommendations: 1. Complete two hours of cangrelor. 2. Dual antiplatelet therapy with aspirin and ticagrelor for at least 12 months. 3. Aggressive secondary prevention. 4. Obtain echocardiogram to assess left ventricular systolic function.  Diagnostic Dominance: Right  Intervention       NST 06/21/2019 Narrative & Impression   There was no ST segment deviation noted during stress.  Myovue with evidence for a small region of very mild ischemia inferior, inferolateral wall (base, mid) Borderline signficance  Nuclear stress EF: 64%.  Low risk scan      Stress echocardiogram performed May 27, 2017  Cardiac catheterization performed February 04, 2013   CT of abdomen pelvis May 23, 2019 IMPRESSION: 1. Stable diffuse sclerotic bone metastases. 2. No evidence of soft tissue metastatic disease or other acute findings. 3. Tiny nonobstructing right renal calculus. 4. Colonic diverticulosis. No radiographic evidence of diverticulitis. Aortic Atherosclerosis (ICD10-I70.0).  Assessment and Plan: 1. S/P INFERIOR STEMI Recent history of chest pain/pressure on 12/10/2019.  Status post inferior STEMI with cardiac catheterization with severe single-vessel CAD with thrombotic occlusion of the proximal RCA, moderate proximal RCA and mid LCx disease.  Widely patent overlapping LAD stents.  Successful stenting the proximal-mid RCA using overlapping DES stents.  Denies any chest pain or DOE today.  Does state he feels unusually tired and fatigued.  He is now on dual antiplatelet therapy with aspirin 81 mg and Brilinta 90 mg p.o. twice daily.  He was prescribed Jardiance at discharge.  We are decreasing Toprol to 25 mg daily due to patient having issues with dizziness when standing and increasing fatigue   2. CAD in native artery History of CAD (DES to mid LAD 2/2 NSTEMI August 2014).  Denies any recent anginal or exertional symptoms.   Recent nuclear stress test was low risk.  Evidence for small region of very mild ischemia inferior, inferior lateral wall.  Continue aspirin 81 mg.  Brilinta 90 mg p.o. twice daily.  Decrease Toprol to 25  mg daily.  3. Mixed hyperlipidemia Last lipid panel at PCP office 04/13/2018 TC 94, TG 8123, HDL 28, LDL 41.  Continue atorvastatin 40 mg p.o. daily.  4. Essential hypertension Blood pressure today 104/60.  Patient states he has been feeling a little dizzy when going from sitting to standing with decreased blood pressures.  Decrease Toprol XL to 25 mg daily.  Continue losartan 25 mg daily.  4. Bilateral lower extremity edema Has chronic lower extremity edema with venous stasis changes.  Unchanged  Medication Adjustments/Labs and Tests Ordered: Current medicines are reviewed at length with the patient today.  Concerns regarding medicines are outlined above.   Disposition: Follow-up with Dr. Domenic Polite or APP 3 months  Signed, Levell July, NP 12/18/2019 9:20 AM    [place] at [place], [place], [place] [address] Phone: [phone]; Fax: [phone]

## 2019-12-18 ENCOUNTER — Ambulatory Visit (INDEPENDENT_AMBULATORY_CARE_PROVIDER_SITE_OTHER): Payer: Medicare Other | Admitting: Family Medicine

## 2019-12-18 ENCOUNTER — Encounter: Payer: Self-pay | Admitting: Family Medicine

## 2019-12-18 ENCOUNTER — Other Ambulatory Visit (HOSPITAL_COMMUNITY): Payer: Medicare Other

## 2019-12-18 ENCOUNTER — Other Ambulatory Visit: Payer: Self-pay

## 2019-12-18 VITALS — BP 104/60 | HR 76 | Ht 71.0 in | Wt 274.8 lb

## 2019-12-18 DIAGNOSIS — I10 Essential (primary) hypertension: Secondary | ICD-10-CM | POA: Diagnosis not present

## 2019-12-18 DIAGNOSIS — I2111 ST elevation (STEMI) myocardial infarction involving right coronary artery: Secondary | ICD-10-CM | POA: Diagnosis not present

## 2019-12-18 DIAGNOSIS — I2510 Atherosclerotic heart disease of native coronary artery without angina pectoris: Secondary | ICD-10-CM | POA: Diagnosis not present

## 2019-12-18 DIAGNOSIS — E782 Mixed hyperlipidemia: Secondary | ICD-10-CM

## 2019-12-18 MED ORDER — METOPROLOL SUCCINATE ER 25 MG PO TB24
25.0000 mg | ORAL_TABLET | Freq: Every day | ORAL | 1 refills | Status: DC
Start: 2019-12-18 — End: 2020-06-02

## 2019-12-18 NOTE — Patient Instructions (Addendum)
Your physician recommends that you schedule a follow-up appointment in: Hunter Jones has recommended you make the following change in your medication:   DECREASE METOPROLOL 25 MG DAILY   Thank you for choosing [facility]!!

## 2019-12-19 ENCOUNTER — Inpatient Hospital Stay (HOSPITAL_COMMUNITY): Payer: Medicare Other | Attending: Hematology

## 2019-12-19 ENCOUNTER — Ambulatory Visit (HOSPITAL_COMMUNITY): Payer: Medicare Other

## 2019-12-19 ENCOUNTER — Other Ambulatory Visit (HOSPITAL_COMMUNITY): Payer: Medicare Other

## 2019-12-19 ENCOUNTER — Inpatient Hospital Stay (HOSPITAL_BASED_OUTPATIENT_CLINIC_OR_DEPARTMENT_OTHER): Payer: Medicare Other | Admitting: Hematology

## 2019-12-19 ENCOUNTER — Inpatient Hospital Stay (HOSPITAL_COMMUNITY): Payer: Medicare Other

## 2019-12-19 ENCOUNTER — Encounter (HOSPITAL_COMMUNITY): Payer: Self-pay | Admitting: Hematology

## 2019-12-19 VITALS — BP 104/68 | HR 83 | Temp 97.3°F | Resp 18 | Wt 274.1 lb

## 2019-12-19 DIAGNOSIS — C61 Malignant neoplasm of prostate: Secondary | ICD-10-CM

## 2019-12-19 DIAGNOSIS — E119 Type 2 diabetes mellitus without complications: Secondary | ICD-10-CM | POA: Diagnosis not present

## 2019-12-19 DIAGNOSIS — Z7902 Long term (current) use of antithrombotics/antiplatelets: Secondary | ICD-10-CM | POA: Insufficient documentation

## 2019-12-19 DIAGNOSIS — I2510 Atherosclerotic heart disease of native coronary artery without angina pectoris: Secondary | ICD-10-CM | POA: Insufficient documentation

## 2019-12-19 DIAGNOSIS — Z9079 Acquired absence of other genital organ(s): Secondary | ICD-10-CM | POA: Diagnosis not present

## 2019-12-19 DIAGNOSIS — Z79899 Other long term (current) drug therapy: Secondary | ICD-10-CM | POA: Insufficient documentation

## 2019-12-19 DIAGNOSIS — Z87891 Personal history of nicotine dependence: Secondary | ICD-10-CM | POA: Diagnosis not present

## 2019-12-19 DIAGNOSIS — C7951 Secondary malignant neoplasm of bone: Secondary | ICD-10-CM | POA: Insufficient documentation

## 2019-12-19 DIAGNOSIS — I252 Old myocardial infarction: Secondary | ICD-10-CM | POA: Insufficient documentation

## 2019-12-19 DIAGNOSIS — Z7982 Long term (current) use of aspirin: Secondary | ICD-10-CM | POA: Diagnosis not present

## 2019-12-19 LAB — COMPREHENSIVE METABOLIC PANEL
ALT: 29 U/L (ref 0–44)
AST: 24 U/L (ref 15–41)
Albumin: 3.8 g/dL (ref 3.5–5.0)
Alkaline Phosphatase: 106 U/L (ref 38–126)
Anion gap: 10 (ref 5–15)
BUN: 35 mg/dL — ABNORMAL HIGH (ref 8–23)
CO2: 24 mmol/L (ref 22–32)
Calcium: 9.3 mg/dL (ref 8.9–10.3)
Chloride: 105 mmol/L (ref 98–111)
Creatinine, Ser: 1.5 mg/dL — ABNORMAL HIGH (ref 0.61–1.24)
GFR calc Af Amer: 56 mL/min — ABNORMAL LOW (ref >60)
GFR calc non Af Amer: 48 mL/min — ABNORMAL LOW (ref >60)
Glucose, Bld: 120 mg/dL — ABNORMAL HIGH (ref 70–99)
Potassium: 4.8 mmol/L (ref 3.5–5.1)
Sodium: 139 mmol/L (ref 135–145)
Total Bilirubin: 0.6 mg/dL (ref 0.3–1.2)
Total Protein: 7.1 g/dL (ref 6.5–8.1)

## 2019-12-19 LAB — CBC WITH DIFFERENTIAL/PLATELET
Abs Immature Granulocytes: 0.02 10*3/uL (ref 0.00–0.07)
Basophils Absolute: 0 10*3/uL (ref 0.0–0.1)
Basophils Relative: 1 %
Eosinophils Absolute: 0.3 10*3/uL (ref 0.0–0.5)
Eosinophils Relative: 4 %
HCT: 40.4 % (ref 39.0–52.0)
Hemoglobin: 13.5 g/dL (ref 13.0–17.0)
Immature Granulocytes: 0 %
Lymphocytes Relative: 33 %
Lymphs Abs: 2.3 10*3/uL (ref 0.7–4.0)
MCH: 32.8 pg (ref 26.0–34.0)
MCHC: 33.4 g/dL (ref 30.0–36.0)
MCV: 98.1 fL (ref 80.0–100.0)
Monocytes Absolute: 0.7 10*3/uL (ref 0.1–1.0)
Monocytes Relative: 10 %
Neutro Abs: 3.6 10*3/uL (ref 1.7–7.7)
Neutrophils Relative %: 52 %
Platelets: 216 10*3/uL (ref 150–400)
RBC: 4.12 MIL/uL — ABNORMAL LOW (ref 4.22–5.81)
RDW: 12.4 % (ref 11.5–15.5)
WBC: 7 10*3/uL (ref 4.0–10.5)
nRBC: 0 % (ref 0.0–0.2)

## 2019-12-19 LAB — PSA: Prostatic Specific Antigen: 1.42 ng/mL (ref 0.00–4.00)

## 2019-12-19 LAB — LACTATE DEHYDROGENASE: LDH: 246 U/L — ABNORMAL HIGH (ref 98–192)

## 2019-12-19 NOTE — Patient Instructions (Signed)
[place] at [facility] Discharge Instructions  You were seen today by Dr. Delton Coombes. He went over your recent results and scans. Dr. Delton Coombes will see you back in 4 weeks for labs and follow up.   Thank you for choosing [place] at [facility] to provide your oncology and hematology care.  To afford each patient quality time with our provider, please arrive at least 15 minutes before your scheduled appointment time.   If you have a lab appointment with the [facility] please come in thru the Main Entrance and check in at the main information desk  You need to re-schedule your appointment should you arrive 10 or more minutes late.  We strive to give you quality time with our providers, and arriving late affects you and other patients whose appointments are after yours.  Also, if you no show three or more times for appointments you may be dismissed from the clinic at the providers discretion.     Again, thank you for choosing [facility].  Our hope is that these requests will decrease the amount of time that you wait before being seen by our physicians.       _____________________________________________________________  Should you have questions after your visit to [facility], please contact our office at [phone] between the hours of 8:00 a.m. and 4:30 p.m.  Voicemails left after 4:00 p.m. will not be returned until the following business day.  For prescription refill requests, have your pharmacy contact our office and allow 72 hours.    Cancer Center Support Programs:   > Cancer Support Group  2nd Tuesday of the month 1pm-2pm, Journey Room

## 2019-12-19 NOTE — Progress Notes (Signed)
[place], [place] [address]   CLINIC:  Medical Oncology/Hematology  PCP:  Monico Blitz, [place] / [place] [address] [phone]   REASON FOR VISIT:  Follow-up for prostate cancer  PRIOR THERAPY: Lupron  NGS Results: Not done  CURRENT THERAPY: Zytiga, prednisone and Lupron  BRIEF ONCOLOGIC HISTORY:  Oncology History  Prostate cancer ([place])  02/06/2014 Procedure   Prostate biopsy by Dr. Clyde Lundborg   02/11/2014 Pathology Results   Prostatic adenocarcinoma identified in 5 of 6 prostate specimens with Gleason pattern showing primary pattern-grade 4, secondary pattern grade 3.  Total Gleason score equals 7.  Proportion of prostatic tissue involved by tumor: 70%.   05/03/2014 Imaging   Bone scan- Negative    06/27/2014 Procedure   Radical resection of prostate by Dr. Raynelle Bring   07/29/2014 Pathology Results   1. Prostate, radical resection - PROSTATIC ADENOCARCINOMA, GLEASON SCORE 4 + 3 = 7. - RIGHT AND LEFT PROSTATE INVOLVED. - RIGHT AND LEFT SEMINAL VESICLES INVOLVED BY TUMOR. - EXTRACAPSULAR EXTENSION BY TUMOR. - TUMOR EXTENDS INTO BLADDER NECK TISSUE. - MARGINS NOT INVOLVED. 2. Lymph nodes, regional resection, left pelvic - THREE BENIGN LYMPH NODES (0/3). 3. Lymph nodes, regional resection, right pelvic - FOUR BENIGN LYMPH NODES (0/4).   11/16/2016 Imaging   Bone scan- New areas of increased uptake superolateral to the left orbit (faint), at S1, and in the posterolateral aspect of the left sixth rib.    11/25/2016 Imaging   CT CAP- 1. Status post radical prostatectomy. No findings to suggest local recurrence of disease or definite extraskeletal metastatic disease in the chest, abdomen or pelvis. However, there are several osseous lesions, as above, concerning for metastatic disease to the bones. 2. Hepatic steatosis. 3. Aortic atherosclerosis, in addition to 2 vessel coronary artery disease. Please note that  although the presence of coronary artery calcium documents the presence of coronary artery disease, the severity of this disease and any potential stenosis cannot be assessed on this non-gated CT examination. Assessment for potential risk factor modification, dietary therapy or pharmacologic therapy may be warranted, if clinically indicated. 4. Additional incidental findings, as above.     CANCER STAGING: Cancer Staging Prostate cancer [facility]) Staging form: Prostate, AJCC 7th Edition - Pathologic stage from 07/29/2014: Stage III (T3b, N0, cM0, Gleason 7) - Signed by Baird Cancer, PA-C on 11/09/2016 - Clinical: Stage IV (T3, N0, M1, PSA: Less than 10, Gleason 7) - Signed by [name], MD on 12/21/2016 - Pathologic: No stage assigned - Unsigned   INTERVAL HISTORY:  Mr. DIANNA DESHLER, a 64 y.o. male, returns for routine follow-up of his prostate cancer. Sabastien was last seen on 11/08/2019.  He went to [facility] on 12/10/2019 for an inferior STEMI and had multiple stents to proximal/mid RCA.   Today he reports that he started taking metoprolol 25 mg once daily. He stopped taking Xtandi because that was supposed to be the cause of the STEMI. He continues doing his cardiac PT sessions. He denies having any bone pains and his chest pressure has resolved since his stent.   REVIEW OF SYSTEMS:  Review of Systems  Constitutional: Positive for appetite change (mildly decreased) and fatigue (moderate).  Respiratory: Positive for cough and shortness of breath.   Gastrointestinal: Positive for constipation and diarrhea.  Neurological: Positive for dizziness.  Hematological: Bruises/bleeds easily (on blood thinners).  All other systems reviewed and are negative.   PAST MEDICAL/SURGICAL  HISTORY:  Past Medical History:  Diagnosis Date  . Asthma   . COPD (chronic obstructive pulmonary disease) ([place])   . Coronary artery disease    DES to mid LAD August 2014 ([facility]); RCA stents  11/2019  . Diabetes mellitus without complication ([place])   . Essential hypertension   . Hyperthyroidism   . NSTEMI (non-ST elevated myocardial infarction) ([place]) 2014  . Prostate cancer [facility] ([place]    Past Surgical History:  Procedure Laterality Date  . APPENDECTOMY    . CORONARY/GRAFT ACUTE MI REVASCULARIZATION N/A 12/10/2019   Procedure: Coronary/Graft Acute MI Revascularization;  Surgeon: Nelva Bush, MD;  Location: [place] CV [facility];  Service: Cardiovascular;  Laterality: N/A;  . LEFT HEART CATH AND CORONARY ANGIOGRAPHY N/A 12/10/2019   Procedure: LEFT HEART CATH AND CORONARY ANGIOGRAPHY;  Surgeon: Nelva Bush, MD;  Location: [place] [facility];  Service: Cardiovascular;  Laterality: N/A;  . LYMPHADENECTOMY Bilateral 06/27/2014   Procedure: LYMPHADENECTOMY;  Surgeon: Raynelle Bring, MD;  Location: [facility];  Service: Urology;  Laterality: Bilateral;  . ROBOT ASSISTED LAPAROSCOPIC RADICAL PROSTATECTOMY N/A 06/27/2014   Procedure: ROBOTIC ASSISTED LAPAROSCOPIC RADICAL PROSTATECTOMY LEVEL 3;  Surgeon: Raynelle Bring, MD;  Location: [facility];  Service: Urology;  Laterality: N/A;  . TONSILLECTOMY      SOCIAL HISTORY:  Social History   Socioeconomic History  . Marital status: Married    Spouse name: Not on file  . Number of children: Not on file  . Years of education: Not on file  . Highest education level: Not on file  Occupational History  . Not on file  Tobacco Use  . Smoking status: Former Smoker    Years: 40.00    Types: Cigarettes    Quit date: 06/21/2012    Years since quitting: 7.4  . Smokeless tobacco: Never Used  Vaping Use  . Vaping Use: Never used  Substance and Sexual Activity  . Alcohol use: Yes    Comment: occasional  . Drug use: No  . Sexual activity: Not on file  Other Topics Concern  . Not on file  Social History Narrative  . Not on file   Social Determinants of Health   Financial Resource Strain:   . Difficulty of Paying Living Expenses:   Food  Insecurity:   . Worried About Charity fundraiser in the Last Year:   . Arboriculturist in the Last Year:   Transportation Needs:   . Film/video editor (Medical):   [name] Kitchen Lack of Transportation (Non-Medical):   Physical Activity:   . Days of Exercise per Week:   . Minutes of Exercise per Session:   Stress:   . Feeling of Stress :   Social Connections:   . Frequency of Communication with Friends and Family:   . Frequency of Social Gatherings with Friends and Family:   . Attends Religious Services:   . Active Member of Clubs or Organizations:   . Attends Archivist Meetings:   [name] Kitchen Marital Status:   Intimate Partner Violence:   . Fear of Current or Ex-Partner:   . Emotionally Abused:   [name] Kitchen Physically Abused:   . Sexually Abused:     FAMILY HISTORY:  Family History  Problem Relation Age of Onset  . Dementia Mother   . Thyroid disease Mother   . Kidney Stones Father   . Stroke Brother     CURRENT MEDICATIONS:  Current Outpatient Medications  Medication Sig Dispense Refill  . albuterol (PROVENTIL HFA;VENTOLIN HFA)  108 (90 BASE) MCG/ACT inhaler Inhale 1-2 puffs into the lungs every 6 (six) hours as needed for wheezing or shortness of breath.     [name] Kitchen aspirin EC 81 MG tablet Take 81 mg by mouth daily.     [name] Kitchen atorvastatin (LIPITOR) 40 MG tablet TAKE 1 TABLET BY MOUTH AT BEDTIME. (Patient taking differently: Take 40 mg by mouth at bedtime. ) 30 tablet 6  . Calcium-Magnesium-Vitamin D (CALCIUM 500 PO) Take 2 tablets by mouth daily. Per pt he takes 600mg  BID    . Co-Enzyme Q-10 100 MG CAPS Take 1 capsule by mouth daily.    [name] Kitchen denosumab (XGEVA) 120 MG/1.7ML SOLN injection Inject 120 mg into the skin every 30 (thirty) days.    . empagliflozin (JARDIANCE) 10 MG TABS tablet Take 1 tablet (10 mg total) by mouth daily. 30 tablet 6  . fluticasone-salmeterol (ADVAIR HFA) 115-21 MCG/ACT inhaler Inhale 2 puffs into the lungs 2 (two) times daily.     [name] Kitchen KRILL OIL PO Take 1 capsule by mouth  daily.     [name] Kitchen Leuprolide Acetate, 3 Month, (LUPRON DEPOT, 54-MONTH, IM) Inject into the muscle every 3 (three) months.    . methimazole (TAPAZOLE) 10 MG tablet Take 10 mg by mouth every other day.     . metoprolol succinate (TOPROL XL) 25 MG 24 hr tablet Take 1 tablet (25 mg total) by mouth daily. 90 tablet 1  . Multiple Vitamin (MULTIVITAMIN) tablet Take 1 tablet by mouth daily.    . nitroGLYCERIN (NITROSTAT) 0.4 MG SL tablet Place 1 tablet (0.4 mg total) under the tongue every 5 (five) minutes as needed. 25 tablet 12  . Potassium 99 MG TABS Take 1 tablet by mouth daily.    . ticagrelor (BRILINTA) 90 MG TABS tablet Take 1 tablet (90 mg total) by mouth 2 (two) times daily. 60 tablet 11  . triamcinolone cream (KENALOG) 0.1 % Apply 1 application topically daily as needed (For lg rash).  (Patient not taking: Reported on 12/19/2019)     No current facility-administered medications for this visit.    ALLERGIES:  Allergies  Allergen Reactions  . Penicillins Hives    PHYSICAL EXAM:  Performance status (ECOG): 1 - Symptomatic but completely ambulatory  Vitals:   12/19/19 1404  BP: 104/68  Pulse: 83  Resp: 18  Temp: (!) 97.3 F (36.3 C)  SpO2: 97%   Wt Readings from Last 3 Encounters:  12/19/19 124.3 kg (274 lb 1.6 oz)  12/18/19 124.6 kg (274 lb 12.8 oz)  12/12/19 127.6 kg (281 lb 6.4 oz)   Physical Exam Vitals reviewed.  Constitutional:      Appearance: Normal appearance. He is obese.  Cardiovascular:     Rate and Rhythm: Normal rate and regular rhythm.     Pulses: Normal pulses.     Heart sounds: Normal heart sounds.  Pulmonary:     Effort: Pulmonary effort is normal.     Breath sounds: Normal breath sounds.  Neurological:     General: No focal deficit present.     Mental Status: He is alert and oriented to person, place, and time.  Psychiatric:        Mood and Affect: Mood normal.        Behavior: Behavior normal.      LABORATORY DATA:  I have reviewed the labs as  listed.  CBC Latest Ref Rng & Units 12/19/2019 12/10/2019 12/10/2019  WBC 4.0 - 10.5 K/uL 7.0 7.4 9.5  Hemoglobin 13.0 - 17.0  g/dL 13.5 11.4(L) 12.8(L)  Hematocrit 39 - 52 % 40.4 33.4(L) 37.0(L)  Platelets 150 - 400 K/uL 216 116(L) 155   CMP Latest Ref Rng & Units 12/19/2019 12/10/2019 12/10/2019  Glucose 70 - 99 mg/dL 120(H) 135(H) 152(H)  BUN 8 - 23 mg/dL 35(H) 20 24(H)  Creatinine 0.61 - 1.24 mg/dL 1.50(H) 1.17 1.16  Sodium 135 - 145 mmol/L 139 139 139  Potassium 3.5 - 5.1 mmol/L 4.8 3.6 4.4  Chloride 98 - 111 mmol/L 105 108 102  CO2 22 - 32 mmol/L 24 23 26   Calcium 8.9 - 10.3 mg/dL 9.3 8.2(L) 9.1  Total Protein 6.5 - 8.1 g/dL 7.1 - -  Total Bilirubin 0.3 - 1.2 mg/dL 0.6 - -  Alkaline Phos 38 - 126 U/L 106 - -  AST 15 - 41 U/L 24 - -  ALT 0 - 44 U/L 29 - -   Lab Results  Component Value Date   LDH 246 (H) 12/19/2019   LDH 196 (H) 05/29/2019   LDH 193 (H) 05/01/2019   Lab Results  Component Value Date   PSA 1.09 11/20/2019    DIAGNOSTIC IMAGING:  I have independently reviewed the scans and discussed with the patient. MR Brain W Wo Contrast  Result Date: 12/09/2019 CLINICAL DATA:  Prostate cancer.  Changes in mood and mental status. MRI HEAD WITHOUT AND WITH CONTRAST TECHNIQUE: Multiplanar, multiecho pulse sequences of the brain and surrounding structures were obtained without and with intravenous contrast. CONTRAST:  86mL GADAVIST GADOBUTROL 1 MMOL/ML IV SOLN COMPARISON:  None. FINDINGS: Brain: No acute infarction, hemorrhage, hydrocephalus, extra-axial collection or mass lesion. Mild atrophy, typical for age. Few small white matter hyperintensities bilaterally. No enhancing mass lesion in the brain. Negative for metastatic disease. Vascular: Normal arterial flow voids. Hypoplastic right vertebral artery. Skull and upper cervical spine: No focal skeletal lesion. Negative for bony metastatic disease. Sinuses/Orbits: Mucosal edema paranasal sinuses. Secretions in the left nasopharynx  which show hyperintense signal on T2 and do not enhance. Therefore this is not felt to be a polyp. Other: None IMPRESSION: No acute abnormality Negative for metastatic disease Mild atrophy and mild white matter changes which are likely due to chronic microvascular ischemia. Electronically Signed   By: Franchot Gallo M.D.   On: 12/09/2019 11:58   CARDIAC CATHETERIZATION  Result Date: 12/10/2019 Conclusions: 1. Severe single-vessel coronary artery disease with thrombotic occlusion of the proximal RCA. 2. Moderate proximal RCA and mid LCx disease. 3. Widely patent overlapping LAD stents. 4. Upper normal left ventricular filling pressure. 5. Successful PCI to proximal/mid RCA using overlapping Resolute Onyx 3.5 x 18 mm and 3.0 x 34 mm drug-eluting stents with 0% residual stenosis and TIMI-3 flow. Recommendations: 1. Complete two hours of cangrelor. 2. Dual antiplatelet therapy with aspirin and ticagrelor for at least 12 months. 3. Aggressive secondary prevention. 4. Obtain echocardiogram to assess left ventricular systolic function. Nelva Bush, MD [facility]   ECHOCARDIOGRAM COMPLETE  Result Date: 12/10/2019    ECHOCARDIOGRAM REPORT   Patient Name:   AUTREY HUMAN Date of Exam: 12/10/2019 Medical Rec #:  [id number]     Height:       71.0 in Accession #:    [id number]    Weight:       290.0 lb Date of Birth:  07-25-1955     BSA:          2.469 m Patient Age:    64 years      BP:  118/60 mmHg Patient Gender: M             HR:           96 bpm. Exam Location:  Inpatient Procedure: 2D Echo, Color Doppler, Cardiac Doppler and Intracardiac            Opacification Agent Indications:    ; ; ; I25.5 Ischemic cardiomyopathy; 122-I22.9 Subsequent ST                 elevation (STEM) and non-ST elevation (NSTEMI) myocardial                 infarction  History:        Patient has no prior history of Echocardiogram examinations. CAD                 and Acute MI, COPD; Risk Factors:Hypertension and Diabetes.                  Cancer.  Sonographer:    Roseanna Rainbow RDCS Referring Phys: [phone] CHRISTOPHER END  Sonographer Comments: Technically difficult study due to poor echo windows. Image acquisition challenging due to patient body habitus and restricted mobility. Patient is post stent placement. IMPRESSIONS  1. Left ventricular ejection fraction, by estimation, is 55 to 60%. The left ventricle has normal function. Left ventricular endocardial border not optimally defined to evaluate regional wall motion. There is mild left ventricular hypertrophy. Left ventricular diastolic parameters were normal.  2. Poor acoustic windows limit study, even with Definity RV is not well seen to accurately assess LVEF . Right ventricular systolic function was not well visualized. The right ventricular size is normal.  3. The mitral valve is normal in structure. Trivial mitral valve regurgitation.  4. The aortic valve is normal in structure. Aortic valve regurgitation is not visualized.  5. The inferior vena cava is dilated in size with <50% respiratory variability, suggesting right atrial pressure of 15 mmHg. FINDINGS  Left Ventricle: Left ventricular ejection fraction, by estimation, is 55 to 60%. The left ventricle has normal function. Left ventricular endocardial border not optimally defined to evaluate regional wall motion. Definity contrast agent was given IV to delineate the left ventricular endocardial borders. The left ventricular internal cavity size was normal in size. There is mild left ventricular hypertrophy. Left ventricular diastolic parameters were normal. Right Ventricle: Poor acoustic windows limit study, even with Definity RV is not well seen to accurately assess LVEF. The right ventricular size is normal. Right vetricular wall thickness was not assessed. Right ventricular systolic function was not well  visualized. Left Atrium: Left atrial size was normal in size. Right Atrium: Right atrial size was normal in size. Pericardium:  Trivial pericardial effusion is present. Mitral Valve: The mitral valve is normal in structure. Trivial mitral valve regurgitation. Tricuspid Valve: The tricuspid valve is normal in structure. Tricuspid valve regurgitation is trivial. Aortic Valve: The aortic valve is normal in structure. Aortic valve regurgitation is not visualized. Pulmonic Valve: The pulmonic valve was normal in structure. Pulmonic valve regurgitation is not visualized. Aorta: The aortic root and ascending aorta are structurally normal, with no evidence of dilitation. Venous: The inferior vena cava is dilated in size with less than 50% respiratory variability, suggesting right atrial pressure of 15 mmHg. IAS/Shunts: No atrial level shunt detected by color flow Doppler.  LEFT VENTRICLE PLAX 2D LVIDd:         4.20 cm      Diastology LVIDs:  3.10 cm      LV e' lateral:   9.57 cm/s LV PW:         1.20 cm      LV E/e' lateral: 8.1 LV IVS:        1.20 cm      LV e' medial:    5.77 cm/s LVOT diam:     2.20 cm      LV E/e' medial:  13.5 LV SV:         67 LV SV Index:   27 LVOT Area:     3.80 cm  LV Volumes (MOD) LV vol d, MOD A2C: 90.4 ml LV vol d, MOD A4C: 128.0 ml LV vol s, MOD A2C: 33.6 ml LV vol s, MOD A4C: 58.8 ml LV SV MOD A2C:     56.8 ml LV SV MOD A4C:     128.0 ml LV SV MOD BP:      61.1 ml RIGHT VENTRICLE             IVC RV S prime:     11.40 cm/s  IVC diam: 2.40 cm TAPSE (M-mode): 1.8 cm LEFT ATRIUM             Index       RIGHT ATRIUM           Index LA diam:        3.50 cm 1.42 cm/m  RA Area:     11.70 cm LA Vol (A2C):   25.6 ml 10.37 ml/m RA Volume:   23.90 ml  9.68 ml/m LA Vol (A4C):   42.9 ml 17.37 ml/m LA Biplane Vol: 34.8 ml 14.09 ml/m  AORTIC VALVE LVOT Vmax:   138.00 cm/s LVOT Vmean:  87.600 cm/s LVOT VTI:    0.175 m  AORTA Ao Root diam: 3.30 cm Ao Asc diam:  3.10 cm MITRAL VALVE MV Area (PHT): 4.27 cm    SHUNTS MV Decel Time: 178 msec    Systemic VTI:  0.18 m MV E velocity: 77.70 cm/s  Systemic Diam: 2.20 cm MV A  velocity: 78.05 cm/s MV E/A ratio:  1.00 Dorris Carnes MD Electronically signed by Dorris Carnes MD Signature Date/Time: 12/10/2019/6:13:07 PM    Final      ASSESSMENT:  1.  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 -Abiraterone 750 mg and prednisone 5 mg started on 06/16/2018. -CTAP and bone scan on 05/23/2019 showed improved response compared to prior scans. -Abiraterone dose increased to 1000 mg daily in the first week of January 2021 as PSA was going up. -CT AP on 11/06/2019 showed unchanged appearance of diffuse sclerotic bone metastasis.  No visceral mets. -Bone scan on 11/06/2019 shows sclerotic metastatic disease to the sacrum.  Similar to slightly increased activity compared to the previous study could be due to technical factors.  No new areas. -Xtandi started around 11/21/2019, discontinued on 12/05/2019 due to mood changes. -ST elevation MI involving the right coronary artery on 12/10/2019, status post cardiac catheterization showing thrombotic occlusion of the proximal RCA, successful PCI with stent placement. -He is on aspirin and Brilinta.   PLAN:  1.  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  He had an MI a week later. -PSA on 11/20/2019 was 1.09.  PSA on 11/08/2019 was 1.54.  PSA today is 1.42. -He would like to delay his Lupron injection today by 4 weeks. -We will reevaluate him  in 4 weeks.  We will send germline mutation testing and somatic mutation testing with guardant 360.  2.  Bone metastasis: -Continue monthly denosumab.  Continue calcium and vitamin D supplements.  3.  Elevated blood sugars: -Hemoglobin A1c 6.7. -Jardiance was started to reduce cardiac events.  4.  CAD: -Status post stent placement.  Continue aspirin and Brilinta.   Orders placed this encounter:  No orders of the defined types were placed in this encounter.    Derek Jack, MD [facility] [phone]   I, Milinda Antis, am acting as a scribe for Dr. Sanda Linger.  I, Derek Jack MD, have reviewed the above documentation for accuracy and completeness, and I agree with the above.

## 2019-12-19 NOTE — Progress Notes (Signed)
Patient seen by Dr. Delton Coombes for follow up visit. NO XGeva today.

## 2019-12-25 ENCOUNTER — Telehealth: Payer: Self-pay | Admitting: Family Medicine

## 2019-12-25 NOTE — Telephone Encounter (Signed)
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 States that he had his MI on 12/10/2019.

## 2019-12-25 NOTE — Telephone Encounter (Signed)
Patient informed and verbalized understanding of plan. 

## 2019-12-25 NOTE — Telephone Encounter (Signed)
     Covering for Dr. Domenic Polite - He just saw Hunter Jones on 12/18/2019 for hospital follow-up and denied any recurrent angina but was having dizzy spells at that time. 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 If he has recurrent dizziness while singing, should sit and rest.   Signed, Erma Heritage, PA-C 12/25/2019, 3:41 PM Pager: [phone]

## 2020-01-16 ENCOUNTER — Inpatient Hospital Stay (HOSPITAL_BASED_OUTPATIENT_CLINIC_OR_DEPARTMENT_OTHER): Payer: Medicare Other | Admitting: Hematology

## 2020-01-16 ENCOUNTER — Other Ambulatory Visit (HOSPITAL_COMMUNITY): Payer: Medicare Other

## 2020-01-16 ENCOUNTER — Encounter (HOSPITAL_COMMUNITY): Payer: Self-pay

## 2020-01-16 ENCOUNTER — Inpatient Hospital Stay (HOSPITAL_COMMUNITY): Payer: Medicare Other

## 2020-01-16 ENCOUNTER — Inpatient Hospital Stay (HOSPITAL_COMMUNITY): Payer: Medicare Other | Attending: Hematology

## 2020-01-16 ENCOUNTER — Other Ambulatory Visit: Payer: Self-pay

## 2020-01-16 VITALS — BP 127/81 | HR 76 | Temp 97.3°F | Resp 18 | Wt 272.7 lb

## 2020-01-16 DIAGNOSIS — C7951 Secondary malignant neoplasm of bone: Secondary | ICD-10-CM | POA: Diagnosis present

## 2020-01-16 DIAGNOSIS — C61 Malignant neoplasm of prostate: Secondary | ICD-10-CM

## 2020-01-16 LAB — COMPREHENSIVE METABOLIC PANEL
ALT: 25 U/L (ref 0–44)
AST: 22 U/L (ref 15–41)
Albumin: 3.9 g/dL (ref 3.5–5.0)
Alkaline Phosphatase: 88 U/L (ref 38–126)
Anion gap: 9 (ref 5–15)
BUN: 22 mg/dL (ref 8–23)
CO2: 24 mmol/L (ref 22–32)
Calcium: 9.4 mg/dL (ref 8.9–10.3)
Chloride: 107 mmol/L (ref 98–111)
Creatinine, Ser: 1.29 mg/dL — ABNORMAL HIGH (ref 0.61–1.24)
GFR calc Af Amer: >60 mL/min (ref >60)
GFR calc non Af Amer: 58 mL/min — ABNORMAL LOW (ref >60)
Glucose, Bld: 139 mg/dL — ABNORMAL HIGH (ref 70–99)
Potassium: 4.4 mmol/L (ref 3.5–5.1)
Sodium: 140 mmol/L (ref 135–145)
Total Bilirubin: 0.5 mg/dL (ref 0.3–1.2)
Total Protein: 6.7 g/dL (ref 6.5–8.1)

## 2020-01-16 LAB — CBC WITH DIFFERENTIAL/PLATELET
Abs Immature Granulocytes: 0.02 10*3/uL (ref 0.00–0.07)
Basophils Absolute: 0 10*3/uL (ref 0.0–0.1)
Basophils Relative: 1 %
Eosinophils Absolute: 0.3 10*3/uL (ref 0.0–0.5)
Eosinophils Relative: 5 %
HCT: 41.5 % (ref 39.0–52.0)
Hemoglobin: 13.8 g/dL (ref 13.0–17.0)
Immature Granulocytes: 0 %
Lymphocytes Relative: 34 %
Lymphs Abs: 2.1 10*3/uL (ref 0.7–4.0)
MCH: 32.9 pg (ref 26.0–34.0)
MCHC: 33.3 g/dL (ref 30.0–36.0)
MCV: 99 fL (ref 80.0–100.0)
Monocytes Absolute: 0.6 10*3/uL (ref 0.1–1.0)
Monocytes Relative: 10 %
Neutro Abs: 3.1 10*3/uL (ref 1.7–7.7)
Neutrophils Relative %: 50 %
Platelets: 157 10*3/uL (ref 150–400)
RBC: 4.19 MIL/uL — ABNORMAL LOW (ref 4.22–5.81)
RDW: 13.4 % (ref 11.5–15.5)
WBC: 6.1 10*3/uL (ref 4.0–10.5)
nRBC: 0 % (ref 0.0–0.2)

## 2020-01-16 MED ORDER — DENOSUMAB 120 MG/1.7ML ~~LOC~~ SOLN
120.0000 mg | Freq: Once | SUBCUTANEOUS | Status: AC
  Administered 2020-01-16: 120 mg via SUBCUTANEOUS
  Filled 2020-01-16: qty 1.7

## 2020-01-16 NOTE — Patient Instructions (Addendum)
[place] at [facility] Discharge Instructions  You were seen today by Dr. Delton Coombes. He went over your recent results. You received your injections today. You will be referred to a geneticist for further evaluation.  We will also send some additional molecular testing.  Dr. Delton Coombes will see you back in 4 weeks for labs and follow up.   Thank you for choosing [place] at [facility] to provide your oncology and hematology care.  To afford each patient quality time with our provider, please arrive at least 15 minutes before your scheduled appointment time.   If you have a lab appointment with the [facility] please come in thru the Main Entrance and check in at the main information desk  You need to re-schedule your appointment should you arrive 10 or more minutes late.  We strive to give you quality time with our providers, and arriving late affects you and other patients whose appointments are after yours.  Also, if you no show three or more times for appointments you may be dismissed from the clinic at the providers discretion.     Again, thank you for choosing [facility].  Our hope is that these requests will decrease the amount of time that you wait before being seen by our physicians.       _____________________________________________________________  Should you have questions after your visit to [facility], please contact our office at [phone] between the hours of 8:00 a.m. and 4:30 p.m.  Voicemails left after 4:00 p.m. will not be returned until the following business day.  For prescription refill requests, have your pharmacy contact our office and allow 72 hours.    Cancer Center Support Programs:   > Cancer Support Group  2nd Tuesday of the month 1pm-2pm, Journey Room

## 2020-01-16 NOTE — Progress Notes (Signed)
Hunter Jones, [place] [address]   CLINIC:  Medical Oncology/Hematology  PCP:  Monico Blitz, [place] [address] [phone]   REASON FOR VISIT:  Follow-up for prostate cancer  PRIOR THERAPY: Radical resection of prostate on 06/27/2014  NGS Results: Not done  CURRENT THERAPY: Zytiga, Lupron and prednisone  BRIEF ONCOLOGIC HISTORY:  Oncology History  Prostate cancer ([place])  02/06/2014 Procedure   Prostate biopsy by Dr. Clyde Lundborg   02/11/2014 Pathology Results   Prostatic adenocarcinoma identified in 5 of 6 prostate specimens with Gleason pattern showing primary pattern-grade 4, secondary pattern grade 3.  Total Gleason score equals 7.  Proportion of prostatic tissue involved by tumor: 70%.   05/03/2014 Imaging   Bone scan- Negative    06/27/2014 Procedure   Radical resection of prostate by Dr. Raynelle Bring   07/29/2014 Pathology Results   1. Prostate, radical resection - PROSTATIC ADENOCARCINOMA, GLEASON SCORE 4 + 3 = 7. - RIGHT AND LEFT PROSTATE INVOLVED. - RIGHT AND LEFT SEMINAL VESICLES INVOLVED BY TUMOR. - EXTRACAPSULAR EXTENSION BY TUMOR. - TUMOR EXTENDS INTO BLADDER NECK TISSUE. - MARGINS NOT INVOLVED. 2. Lymph nodes, regional resection, left pelvic - THREE BENIGN LYMPH NODES (0/3). 3. Lymph nodes, regional resection, right pelvic - FOUR BENIGN LYMPH NODES (0/4).   11/16/2016 Imaging   Bone scan- New areas of increased uptake superolateral to the left orbit (faint), at S1, and in the posterolateral aspect of the left sixth rib.    11/25/2016 Imaging   CT CAP- 1. Status post radical prostatectomy. No findings to suggest local recurrence of disease or definite extraskeletal metastatic disease in the chest, abdomen or pelvis. However, there are several osseous lesions, as above, concerning for metastatic disease to the bones. 2. Hepatic steatosis. 3. Aortic atherosclerosis, in addition to 2 vessel coronary  artery disease. Please note that although the presence of coronary artery calcium documents the presence of coronary artery disease, the severity of this disease and any potential stenosis cannot be assessed on this non-gated CT examination. Assessment for potential risk factor modification, dietary therapy or pharmacologic therapy may be warranted, if clinically indicated. 4. Additional incidental findings, as above.     CANCER STAGING: Cancer Staging Prostate cancer [facility]) Staging form: Prostate, AJCC 7th Edition - Pathologic stage from 07/29/2014: Stage III (T3b, N0, cM0, Gleason 7) - Signed by Baird Cancer, PA-C on 11/09/2016 - Clinical: Stage IV (T3, N0, M1, PSA: Less than 10, Gleason 7) - Signed by [name], MD on 12/21/2016 - Pathologic: No stage assigned - Unsigned   INTERVAL HISTORY:  [name], a 64 y.o. male, returns for routine follow-up of his prostate cancer. Louis was last seen on 12/19/2019.  Today he reports feeling well, with the exception of left lower back pain; the pain is intermittent, daily for the past 1 month, and worse in the morning and in some positions. He denies having any CP or chest pressure and he will be starting rehab the week of 8/8.  He never met with the geneticist.  REVIEW OF SYSTEMS:  Review of Systems  Constitutional: Positive for fatigue (mild). Negative for appetite change.  Musculoskeletal: Positive for back pain (6/10 L lower back pain).  All other systems reviewed and are negative.   PAST MEDICAL/SURGICAL HISTORY:  Past Medical History:  Diagnosis Date   Asthma    COPD (chronic obstructive pulmonary disease) (HCC)    Coronary artery disease  DES to mid LAD August 2014 ([facility]); RCA stents 11/2019   Diabetes mellitus without complication [facility])    Essential hypertension    Hyperthyroidism    NSTEMI (non-ST elevated myocardial infarction) ([facility]) 2014   Prostate cancer [facility])    Past Surgical History:    Procedure Laterality Date   APPENDECTOMY     CORONARY/GRAFT ACUTE MI REVASCULARIZATION N/A 12/10/2019   Procedure: Coronary/Graft Acute MI Revascularization;  Surgeon: Nelva Bush, MD;  Location: [facility];  Service: Cardiovascular;  Laterality: N/A;   LEFT HEART CATH AND CORONARY ANGIOGRAPHY N/A 12/10/2019   Procedure: LEFT HEART CATH AND CORONARY ANGIOGRAPHY;  Surgeon: Nelva Bush, MD;  Location: [facility];  Service: Cardiovascular;  Laterality: N/A;   LYMPHADENECTOMY Bilateral 06/27/2014   Procedure: LYMPHADENECTOMY;  Surgeon: Raynelle Bring, MD;  Location: [facility];  Service: Urology;  Laterality: Bilateral;   ROBOT ASSISTED LAPAROSCOPIC RADICAL PROSTATECTOMY N/A 06/27/2014   Procedure: ROBOTIC ASSISTED LAPAROSCOPIC RADICAL PROSTATECTOMY LEVEL 3;  Surgeon: Raynelle Bring, MD;  Location: [facility];  Service: Urology;  Laterality: N/A;   TONSILLECTOMY      SOCIAL HISTORY:  Social History   Socioeconomic History   Marital status: Married    Spouse name: Not on file   Number of children: Not on file   Years of education: Not on file   Highest education level: Not on file  Occupational History   Not on file  Tobacco Use   Smoking status: Former Smoker    Years: 40.00    Types: Cigarettes    Quit date: 06/21/2012    Years since quitting: 7.5   Smokeless tobacco: Never Used  Vaping Use   Vaping Use: Never used  Substance and Sexual Activity   Alcohol use: Yes    Comment: occasional   Drug use: No   Sexual activity: Not on file  Other Topics Concern   Not on file  Social History Narrative   Not on file   Social Determinants of Health   Financial Resource Strain:    Difficulty of Paying Living Expenses:   Food Insecurity:    Worried About Charity fundraiser in the Last Year:    Arboriculturist in the Last Year:   Transportation Needs:    Film/video editor (Medical):    Lack of Transportation (Non-Medical):   Physical  Activity:    Days of Exercise per Week:    Minutes of Exercise per Session:   Stress:    Feeling of Stress :   Social Connections:    Frequency of Communication with Friends and Family:    Frequency of Social Gatherings with Friends and Family:    Attends Religious Services:    Active Member of Clubs or Organizations:    Attends Music therapist:    Marital Status:   Intimate Partner Violence:    Fear of Current or Ex-Partner:    Emotionally Abused:    Physically Abused:    Sexually Abused:     FAMILY HISTORY:  Family History  Problem Relation Age of Onset   Dementia Mother    Thyroid disease Mother    Kidney Stones Father    Stroke Brother     CURRENT MEDICATIONS:  Current Outpatient Medications  Medication Sig Dispense Refill   aspirin EC 81 MG tablet Take 81 mg by mouth daily.      atorvastatin (LIPITOR) 40 MG tablet TAKE 1 TABLET BY MOUTH AT BEDTIME. (Patient taking differently: Take  40 mg by mouth at bedtime. ) 30 tablet 6   Calcium-Magnesium-Vitamin D (CALCIUM 500 PO) Take 2 tablets by mouth daily. Per pt he takes 630m BID     Co-Enzyme Q-10 100 MG CAPS Take 1 capsule by mouth daily.     denosumab (XGEVA) 120 MG/1.7ML SOLN injection Inject 120 mg into the skin every 30 (thirty) days.     empagliflozin (JARDIANCE) 10 MG TABS tablet Take 1 tablet (10 mg total) by mouth daily. 30 tablet 6   fluticasone-salmeterol (ADVAIR HFA) 115-21 MCG/ACT inhaler Inhale 2 puffs into the lungs 2 (two) times daily.      KRILL OIL PO Take 1 capsule by mouth daily.      Leuprolide Acetate, 3 Month, (LUPRON DEPOT, 14-MONTH, IM) Inject into the muscle every 3 (three) months.     losartan (COZAAR) 25 MG tablet Take 25 mg by mouth daily.     methimazole (TAPAZOLE) 10 MG tablet Take 10 mg by mouth every other day.      metoprolol succinate (TOPROL XL) 25 MG 24 hr tablet Take 1 tablet (25 mg total) by mouth daily. 90 tablet 1   Multiple Vitamin  (MULTIVITAMIN) tablet Take 1 tablet by mouth daily.     Potassium 99 MG TABS Take 1 tablet by mouth daily.     ticagrelor (BRILINTA) 90 MG TABS tablet Take 1 tablet (90 mg total) by mouth 2 (two) times daily. 60 tablet 11   albuterol (PROVENTIL HFA;VENTOLIN HFA) 108 (90 BASE) MCG/ACT inhaler Inhale 1-2 puffs into the lungs every 6 (six) hours as needed for wheezing or shortness of breath.  (Patient not taking: Reported on 01/16/2020)     nitroGLYCERIN (NITROSTAT) 0.4 MG SL tablet Place 1 tablet (0.4 mg total) under the tongue every 5 (five) minutes as needed. (Patient not taking: Reported on 01/16/2020) 25 tablet 12   triamcinolone cream (KENALOG) 0.1 % Apply 1 application topically daily as needed (For lg rash).  (Patient not taking: Reported on 01/16/2020)     No current facility-administered medications for this visit.    ALLERGIES:  Allergies  Allergen Reactions   Penicillins Hives    PHYSICAL EXAM:  Performance status (ECOG): 1 - Symptomatic but completely ambulatory  Vitals:   01/16/20 1315  BP: 127/81  Pulse: 76  Resp: 18  Temp: (!) 97.3 F (36.3 C)  SpO2: 98%   Wt Readings from Last 3 Encounters:  01/16/20 272 lb 11.2 oz (123.7 kg)  12/19/19 274 lb 1.6 oz (124.3 kg)  12/18/19 274 lb 12.8 oz (124.6 kg)   Physical Exam Vitals reviewed.  Constitutional:      Appearance: Normal appearance. He is obese.  Musculoskeletal:       Back:  Neurological:     General: No focal deficit present.     Mental Status: He is alert and oriented to person, place, and time.  Psychiatric:        Mood and Affect: Mood normal.        Behavior: Behavior normal.      LABORATORY DATA:  I have reviewed the labs as listed.  CBC Latest Ref Rng & Units 01/16/2020 12/19/2019 12/10/2019  WBC 4.0 - 10.5 K/uL 6.1 7.0 7.4  Hemoglobin 13.0 - 17.0 g/dL 13.8 13.5 11.4(L)  Hematocrit 39 - 52 % 41.5 40.4 33.4(L)  Platelets 150 - 400 K/uL 157 216 116(L)   CMP Latest Ref Rng & Units 01/16/2020 12/19/2019  12/10/2019  Glucose 70 - 99 mg/dL 139(H) 120(H) 135(H)  BUN 8 - 23 mg/dL 22 35(H) 20  Creatinine 0.61 - 1.24 mg/dL 1.29(H) 1.50(H) 1.17  Sodium 135 - 145 mmol/L 140 139 139  Potassium 3.5 - 5.1 mmol/L 4.4 4.8 3.6  Chloride 98 - 111 mmol/L 107 105 108  CO2 22 - 32 mmol/L _0 Calcium 8.9 - 10.3 mg/dL 9.4 9.3 8.2(L)  Total Protein 6.5 - 8.1 g/dL 6.7 7.1 -  Total Bilirubin 0.3 - 1.2 mg/dL 0.5 0.6 -  Alkaline Phos 38 - 126 U/L 88 106 -  AST 15 - 41 U/L 22 24 -  ALT 0 - 44 U/L 25 29 -   Lab Results  Component Value Date   PSA 1.42 12/19/2019   PSA 1.09 11/20/2019   PSA 1.54 11/08/2019   Lab Results  Component Value Date   LDH 246 (H) 12/19/2019   LDH 196 (H) 05/29/2019   LDH 193 (H) 05/01/2019    DIAGNOSTIC IMAGING:  I have independently reviewed the scans and discussed with the patient. No results found.   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 -Abiraterone 750 mg and prednisone 5 mg started on 06/16/2018. -CTAP and bone scan on 05/23/2019 showed improved response compared to prior scans. -Abiraterone dose increased to 1000 mg daily in the first week of January 2021 as PSA was going up. -CT AP on 11/06/2019 showed unchanged appearance of diffuse sclerotic bone metastasis. No visceral mets. -Bone scan on 11/06/2019 shows sclerotic metastatic disease to the sacrum. Similar to slightly increased activity compared to the previous study could be due to technical factors. No new areas. -Xtandi started around 11/21/2019, discontinued on 12/05/2019 due to mood changes. -ST elevation MI involving the right coronary artery on 12/10/2019, status post cardiac catheterization showing thrombotic occlusion of the proximal RCA, successful PCI with stent placement. -He is on aspirin and Brilinta.   PLAN:  1. 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 -Last PSA on 12/19/2019 was  1.42. -We talked about treatment options.  He does not want to receive any chemotherapy at this time.  I have recommended genetic testing to look for any BRCA mutations.  We will also send guardant 360 testing including MSI testing. -I plan to see him back in 4 weeks.  I plan to repeat PSA.  2. Bone metastasis: -Continue denosumab today.  Calcium is within normal limits.  Continue calcium and vitamin D.  3.  Elevated blood sugars: -Continue Jardiance.  4.  CAD and stents: -Continue aspirin and Brilinta.   Orders placed this encounter:  No orders of the defined types were placed in this encounter.    Derek Jack, MD [place] [phone]   I, Milinda Antis, am acting as a scribe for Dr. Sanda Linger.  I, Derek Jack MD, have reviewed the above documentation for accuracy and completeness, and I agree with the above.

## 2020-01-16 NOTE — Progress Notes (Signed)
Genetics and [place] labs drawn today per Dr. Delton Coombes. Labs drawn using a 22G butterfly needle from the [facility] with no difficulty. Puncture site covered with gauze and bandaid.

## 2020-01-16 NOTE — Progress Notes (Signed)
Pt given Xgeva in right abdomen. Pt tolerated well with no complaints. Pt stable and discharged home ambulatory. Pt to return as scheduled.

## 2020-01-17 ENCOUNTER — Ambulatory Visit (HOSPITAL_COMMUNITY): Payer: Medicare Other

## 2020-01-17 ENCOUNTER — Ambulatory Visit (HOSPITAL_COMMUNITY): Payer: Medicare Other | Admitting: Hematology

## 2020-01-21 ENCOUNTER — Telehealth: Payer: Self-pay | Admitting: Cardiology

## 2020-01-21 NOTE — Telephone Encounter (Signed)
1. Forms from: patient brought paper work for completion into the office   2. Received on: 01/18/2020  3. Completed patient auth attached: yes  4. Took form to MD box for completion- I informed the patient that Dr. Domenic Polite is currently out of the office for the next week and that once he has returned the paper work will be completed upon his arrival back into the office. Patient voiced understanding.

## 2020-01-23 ENCOUNTER — Encounter (HOSPITAL_COMMUNITY)
Admission: RE | Admit: 2020-01-23 | Discharge: 2020-01-23 | Disposition: A | Discharge status: Home / Self Care | Payer: Medicare Other | Source: Ambulatory Visit | Attending: Cardiology | Admitting: Cardiology

## 2020-01-23 ENCOUNTER — Encounter (HOSPITAL_COMMUNITY): Payer: Self-pay

## 2020-01-23 ENCOUNTER — Other Ambulatory Visit: Payer: Self-pay

## 2020-01-23 VITALS — BP 100/60 | HR 78 | Ht 71.0 in | Wt 272.3 lb

## 2020-01-23 DIAGNOSIS — Z955 Presence of coronary angioplasty implant and graft: Secondary | ICD-10-CM | POA: Diagnosis present

## 2020-01-23 DIAGNOSIS — I213 ST elevation (STEMI) myocardial infarction of unspecified site: Secondary | ICD-10-CM

## 2020-01-23 LAB — GLUCOSE, CAPILLARY: Glucose-Capillary: 101 mg/dL — ABNORMAL HIGH (ref 70–99)

## 2020-01-23 NOTE — Progress Notes (Signed)
Cardiac/Pulmonary Rehab Medication Review by a Pharmacist  Does the patient  feel that his/her medications are working for him/her?  yes  Has the patient been experiencing any side effects to the medications prescribed?  no  Does the patient measure his/her own blood pressure or blood glucose at home?  yes   Does the patient have any problems obtaining medications due to transportation or finances?   no  Understanding of regimen: good Understanding of indications: good Potential of compliance: good  Questions asked to Determine Patient Understanding of Medication Regimen:  1. What is the name of the medication?  2. What is the medication used for?  3. When should it be taken?  4. How much should be taken?  5. How will you take it?  6. What side effects should you report?  Understanding Defined as: Excellent: All questions above are correct Good: Questions 1-4 are correct Fair: Questions 1-2 are correct  Poor: 1 or none of the above questions are correct   Pharmacist comments: Overall, patient has good understanding and high compliance.  He has no questions about medications.    Ramond Craver 01/23/2020 1:32 PM

## 2020-01-23 NOTE — Progress Notes (Signed)
Cardiac Individual Treatment Plan  Patient Details  Name: Hunter Jones MRN: [id number] Date of Birth: 07/31/1955 Referring Provider:     [place] from 01/23/2020 in [place]  Referring Provider Domenic Polite      Initial Encounter Date:    CARDIAC REHAB PHASE II ORIENTATION from 01/23/2020 in [place]  Date 01/23/20      Visit Diagnosis: ST elevation myocardial infarction (STEMI), unspecified artery ([place])  S/P coronary artery stent placement  Patient's Home Medications on Admission:  Current Outpatient Medications:  .  albuterol (PROVENTIL HFA;VENTOLIN HFA) 108 (90 BASE) MCG/ACT inhaler, Inhale 1-2 puffs into the lungs every 6 (six) hours as needed for wheezing or shortness of breath. , Disp: , Rfl:  .  aspirin EC 81 MG tablet, Take 81 mg by mouth daily. , Disp: , Rfl:  .  atorvastatin (LIPITOR) 40 MG tablet, TAKE 1 TABLET BY MOUTH AT BEDTIME. (Patient taking differently: Take 40 mg by mouth at bedtime. ), Disp: 30 tablet, Rfl: 6 .  Calcium-Magnesium-Vitamin D (CALCIUM 500 PO), Take 2 tablets by mouth daily with breakfast. Per pt he takes 600mg  BID, Disp: , Rfl:  .  Co-Enzyme Q-10 100 MG CAPS, Take 1 capsule by mouth daily., Disp: , Rfl:  .  denosumab (XGEVA) 120 MG/1.7ML SOLN injection, Inject 120 mg into the skin every 30 (thirty) days., Disp: , Rfl:  .  empagliflozin (JARDIANCE) 10 MG TABS tablet, Take 1 tablet (10 mg total) by mouth daily., Disp: 30 tablet, Rfl: 6 .  fluticasone-salmeterol (ADVAIR HFA) 115-21 MCG/ACT inhaler, Inhale 2 puffs into the lungs 2 (two) times daily. , Disp: , Rfl:  .  KRILL OIL PO, Take 1 capsule by mouth daily. , Disp: , Rfl:  .  Leuprolide Acetate, 3 Month, (LUPRON DEPOT, 17-MONTH, IM), Inject into the muscle every 3 (three) months., Disp: , Rfl:  .  losartan (COZAAR) 25 MG tablet, Take 25 mg by mouth daily. (Patient not taking: Reported on 01/23/2020), Disp: , Rfl:  .  methimazole  (TAPAZOLE) 10 MG tablet, Take 10 mg by mouth every other day. , Disp: , Rfl:  .  metoprolol succinate (TOPROL XL) 25 MG 24 hr tablet, Take 1 tablet (25 mg total) by mouth daily., Disp: 90 tablet, Rfl: 1 .  Multiple Vitamin (MULTIVITAMIN) tablet, Take 1 tablet by mouth daily., Disp: , Rfl:  .  nitroGLYCERIN (NITROSTAT) 0.4 MG SL tablet, Place 1 tablet (0.4 mg total) under the tongue every 5 (five) minutes as needed., Disp: 25 tablet, Rfl: 12 .  Potassium 99 MG TABS, Take 1 tablet by mouth daily., Disp: , Rfl:  .  ticagrelor (BRILINTA) 90 MG TABS tablet, Take 1 tablet (90 mg total) by mouth 2 (two) times daily., Disp: 60 tablet, Rfl: 11 .  triamcinolone cream (KENALOG) 0.1 %, Apply 1 application topically daily as needed (For lg rash). , Disp: , Rfl:   Past Medical History: Past Medical History:  Diagnosis Date  . Asthma   . COPD (chronic obstructive pulmonary disease) ([place])   . Coronary artery disease    DES to mid LAD August 2014 ([facility]); RCA stents 11/2019  . Diabetes mellitus without complication ([place])   . Essential hypertension   . Hyperthyroidism   . NSTEMI (non-ST elevated myocardial infarction) ([facility]) 2014  . Prostate cancer ([place])     Tobacco Use: Social History   Tobacco Use  Smoking Status Former Smoker  . Years: 40.00  . Types: Cigarettes  .  Quit date: 06/21/2012  . Years since quitting: 7.5  Smokeless Tobacco Never Used    Labs: Recent Review Scientist, physiological    Labs for ITP Cardiac and Pulmonary Rehab Latest Ref Rng & Units 11/20/2019 12/10/2019   Cholestrol 0 - 200 mg/dL - 68   LDLCALC 0 - 99 mg/dL - 22   HDL >40 mg/dL - 28(L)   Trlycerides <150 mg/dL - 91   Hemoglobin A1c 4.8 - 5.6 % 6.7(H) -      Capillary Blood Glucose: Lab Results  Component Value Date   GLUCAP 101 (H) 01/23/2020   GLUCAP 202 (H) 12/12/2019   GLUCAP 110 (H) 12/11/2019   GLUCAP 102 (H) 12/11/2019   GLUCAP 119 (H) 12/11/2019     Exercise Target Goals: Exercise Program Goal: Individual  exercise prescription set using results from initial 6 min walk test and THRR while considering  patient's activity barriers and safety.   Exercise Prescription Goal: Starting with aerobic activity 30 plus minutes a day, 3 days per week for initial exercise prescription. Provide home exercise prescription and guidelines that participant acknowledges understanding prior to discharge.  Activity Barriers & Risk Stratification:  Activity Barriers & Cardiac Risk Stratification - 01/23/20 1444      Activity Barriers & Cardiac Risk Stratification   Activity Barriers Back Problems;Deconditioning;Other (comment)    Comments Bilateral hip pain when walking.    Cardiac Risk Stratification High           6 Minute Walk:  6 Minute Walk    Row Name 01/23/20 1441         6 Minute Walk   Phase Initial     Distance 1300 feet     Walk Time 6 minutes     # of Rest Breaks 0     MPH 2.46     METS 2.4     RPE 11     Perceived Dyspnea  11     VO2 Peak 8.41     Symptoms Yes (comment)     Comments Bilateral hip pain 3/10. He was a little SOB towards the end of walk test.     Resting HR 78 bpm     Resting BP 100/60     Resting Oxygen Saturation  98 %     Exercise Oxygen Saturation  during 6 min walk 96 %     Max Ex. HR 100 bpm     Max Ex. BP 110/64     2 Minute Post BP 102/58            Oxygen Initial Assessment:   Oxygen Re-Evaluation:   Oxygen Discharge (Final Oxygen Re-Evaluation):   Initial Exercise Prescription:  Initial Exercise Prescription - 01/23/20 1400      Date of Initial Exercise RX and Referring Provider   Date 01/23/20    Referring Provider Domenic Polite    Expected Discharge Date 04/24/20      NuStep   Level 1    SPM 80    Minutes 22      Recumbant Elliptical   Level 1    RPM 60    Minutes 17      Prescription Details   Frequency (times per week) 3    Duration Progress to 30 minutes of continuous aerobic without signs/symptoms of physical distress       Intensity   THRR 40-80% of Max Heartrate 62/124    Ratings of Perceived Exertion 11-13    Perceived Dyspnea 0-4  Progression   Progression Continue progressive overload as per policy without signs/symptoms or physical distress.      Resistance Training   Training Prescription Yes    Weight 2    Reps 10-15           Perform Capillary Blood Glucose checks as needed.  Exercise Prescription Changes:   Exercise Comments:   Exercise Goals and Review:   Exercise Goals    Row Name 01/23/20 1451             Exercise Goals   Increase Physical Activity Yes       Intervention Provide advice, education, support and counseling about physical activity/exercise needs.;Develop an individualized exercise prescription for aerobic and resistive training based on initial evaluation findings, risk stratification, comorbidities and participant's personal goals.       Expected Outcomes Short Term: Attend rehab on a regular basis to increase amount of physical activity.;Long Term: Add in home exercise to make exercise part of routine and to increase amount of physical activity.;Long Term: Exercising regularly at least 3-5 days a week.       Increase Strength and Stamina Yes       Expected Outcomes Short Term: Increase workloads from initial exercise prescription for resistance, speed, and METs.;Short Term: Perform resistance training exercises routinely during rehab and add in resistance training at home;Long Term: Improve cardiorespiratory fitness, muscular endurance and strength as measured by increased METs and functional capacity (6MWT)       Able to understand and use rate of perceived exertion (RPE) scale Yes       Intervention Provide education and explanation on how to use RPE scale       Expected Outcomes Short Term: Able to use RPE daily in rehab to express subjective intensity level;Long Term:  Able to use RPE to guide intensity level when exercising independently       Knowledge and  understanding of Target Heart Rate Range (THRR) Yes       Intervention Provide education and explanation of THRR including how the numbers were predicted and where they are located for reference       Expected Outcomes Short Term: Able to state/look up THRR;Long Term: Able to use THRR to govern intensity when exercising independently;Short Term: Able to use daily as guideline for intensity in rehab       Able to check pulse independently Yes       Intervention Provide education and demonstration on how to check pulse in carotid and radial arteries.;Review the importance of being able to check your own pulse for safety during independent exercise       Expected Outcomes Short Term: Able to explain why pulse checking is important during independent exercise;Long Term: Able to check pulse independently and accurately       Understanding of Exercise Prescription Yes       Intervention Provide education, explanation, and written materials on patient's individual exercise prescription       Expected Outcomes Short Term: Able to explain program exercise prescription;Long Term: Able to explain home exercise prescription to exercise independently              Exercise Goals Re-Evaluation :    Discharge Exercise Prescription (Final Exercise Prescription Changes):   Nutrition:  Target Goals: Understanding of nutrition guidelines, daily intake of sodium 1500mg , cholesterol 200mg , calories 30% from fat and 7% or less from saturated fats, daily to have 5 or more servings of fruits and vegetables.  Biometrics:  Pre Biometrics - 01/23/20 1452      Pre Biometrics   Height 5\' 11"  (1.803 m)    Weight 123.5 kg    Waist Circumference 49 inches    Hip Circumference 43 inches    Waist to Hip Ratio 1.14 %    BMI (Calculated) 37.99    Triceps Skinfold 19 mm    % Body Fat 35.7 %    Grip Strength 32.5 kg    Flexibility 0 in    Single Leg Stand 4.58 seconds            Nutrition Therapy Plan and  Nutrition Goals:  Nutrition Therapy & Goals - 01/23/20 1438      Personal Nutrition Goals   Comments Patient socred 818 on his medficts diet assessment. He scored high in meats, condiments, butter, and ice cream. I discussed the assessment with him. He says he and his wife are trying to do better. He trys to watch his sugar. Hand-out provided on how to make better choices when shopping and preparing meals. Will continue to montior.      Intervention Plan   Intervention Nutrition handout(s) given to patient.           Nutrition Assessments:  Nutrition Assessments - 01/23/20 1440      MEDFICTS Scores   Pre Score 123           Nutrition Goals Re-Evaluation:   Nutrition Goals Discharge (Final Nutrition Goals Re-Evaluation):   Psychosocial: Target Goals: Acknowledge presence or absence of significant depression and/or stress, maximize coping skills, provide positive support system. Participant is able to verbalize types and ability to use techniques and skills needed for reducing stress and depression.  Initial Review & Psychosocial Screening:  Initial Psych Review & Screening - 01/23/20 1444      Initial Review   Current issues with None Identified      Family Dynamics   Good Support System? Yes    Comments Patient has no psychosocial issues identified at his orientation visit. He states he has very good family support and is very excited to start the program and get back to work. He does have prostate cancer which he is currently being treated for but has a positive outlook. Will continue to monitor.      Barriers   Psychosocial barriers to participate in program There are no identifiable barriers or psychosocial needs.      Screening Interventions   Interventions Encouraged to exercise    Expected Outcomes Short Term goal: Identification and review with participant of any Quality of Life or Depression concerns found by scoring the questionnaire.;Long Term goal: The  participant improves quality of Life and PHQ9 Scores as seen by post scores and/or verbalization of changes           Quality of Life Scores:  Quality of Life - 01/23/20 1454      Quality of Life   Select Quality of Life      Quality of Life Scores   Health/Function Pre 21 %    Socioeconomic Pre 24.88 %    Psych/Spiritual Pre 23.57 %    Family Pre 28.8 %    GLOBAL Pre 23.51 %          Scores of 19 and below usually indicate a poorer quality of life in these areas.  A difference of  2-3 points is a clinically meaningful difference.  A difference of 2-3 points in the total score of the Quality of  Life Index has been associated with significant improvement in overall quality of life, self-image, physical symptoms, and general health in studies assessing change in quality of life.  PHQ-9: Recent Review Flowsheet Data    Depression screen [facility] 2/9 01/23/2020   Decreased Interest 0   Down, Depressed, Hopeless 0   PHQ - 2 Score 0   Altered sleeping 0   Tired, decreased energy 1   Change in appetite 0   Feeling bad or failure about yourself  0   Trouble concentrating 0   Moving slowly or fidgety/restless 0   Suicidal thoughts 0   PHQ-9 Score 1     Interpretation of Total Score  Total Score Depression Severity:  1-4 = Minimal depression, 5-9 = Mild depression, 10-14 = Moderate depression, 15-19 = Moderately severe depression, 20-27 = Severe depression   Psychosocial Evaluation and Intervention:  Psychosocial Evaluation - 01/23/20 1504      Psychosocial Evaluation & Interventions   Interventions Encouraged to exercise with the program and follow exercise prescription;Stress management education;Relaxation education    Comments Patient has no psychosocial issues identified at his orientation visit. His initial QOL was 27.58% and his PHQ-9 was 1. Will continue to monitor.    Expected Outcomes Patient will have no psychosocial issues identified at discharge.    Continue  Psychosocial Services  No Follow up required           Psychosocial Re-Evaluation:   Psychosocial Discharge (Final Psychosocial Re-Evaluation):   Vocational Rehabilitation: Provide vocational rehab assistance to qualifying candidates.   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 He does not need vocational rehab.           Education: Education Goals: Education classes will be provided on a weekly basis, covering required topics. Participant will state understanding/return demonstration of topics presented.  Learning Barriers/Preferences:  Learning Barriers/Preferences - 01/23/20 1440      Learning Barriers/Preferences   Learning Barriers None    Learning Preferences Audio;Pictoral           Education Topics: Hypertension, Hypertension Reduction -Define heart disease and high blood pressure. Discus how high blood pressure affects the body and ways to reduce high blood pressure.   Exercise and Your Heart -Discuss why it is important to exercise, the FITT principles of exercise, normal and abnormal responses to exercise, and how to exercise safely.   Angina -Discuss definition of angina, causes of angina, treatment of angina, and how to decrease risk of having angina.   Cardiac Medications -Review what the following cardiac medications are used for, how they affect the body, and side effects that may occur when taking the medications.  Medications include Aspirin, Beta blockers, calcium channel blockers, ACE Inhibitors, angiotensin receptor blockers, diuretics, digoxin, and antihyperlipidemics.   Congestive Heart Failure -Discuss the definition of CHF, how to live with CHF, the signs and symptoms  of CHF, and how keep track of weight and sodium intake.   Heart Disease and Intimacy -Discus the effect sexual activity has on the heart, how changes occur during intimacy as we age, and safety during sexual activity.   Smoking Cessation / COPD -Discuss different methods to quit smoking, the health benefits of quitting smoking, and the  definition of COPD.   Nutrition I: Fats -Discuss the types of cholesterol, what cholesterol does to the heart, and how cholesterol levels can be controlled.   Nutrition II: Labels -Discuss the different components of food labels and how to read food label   Heart Parts/Heart Disease and PAD -Discuss the anatomy of the heart, the pathway of blood circulation through the heart, and these are affected by heart disease.   Stress I: Signs and Symptoms -Discuss the causes of stress, how stress may lead to anxiety and depression, and ways to limit stress.   Stress II: Relaxation -Discuss different types of relaxation techniques to limit stress.   Warning Signs of Stroke / TIA -Discuss definition of a stroke, what the signs and symptoms are of a stroke, and how to identify when someone is having stroke.   Knowledge Questionnaire Score:  Knowledge Questionnaire Score - 01/23/20 1441      Knowledge Questionnaire Score   Pre Score 21/24           Core Components/Risk Factors/Patient Goals at Admission:  Personal Goals and Risk Factors at Admission - 01/23/20 1441      Core Components/Risk Factors/Patient Goals on Admission    Weight Management Obesity    Diabetes Yes    Intervention Provide education about signs/symptoms and action to take for hypo/hyperglycemia.;Provide education about proper nutrition, including hydration, and aerobic/resistive exercise prescription along with prescribed medications to achieve blood glucose in normal ranges: Fasting glucose 65-99 mg/dL    Expected Outcomes Short Term: Participant verbalizes understanding of  the signs/symptoms and immediate care of hyper/hypoglycemia, proper foot care and importance of medication, aerobic/resistive exercise and nutrition plan for blood glucose control.    Personal Goal Other Yes    Personal Goal Get in better shape. Be able to return to work.    Intervention Patient will attend CR 3 days/week and supplement with exercise at home 2 days/week.    Expected Outcomes Patient will meet both program and personal goals.           Core Components/Risk Factors/Patient Goals Review:    Core Components/Risk Factors/Patient Goals at Discharge (Final Review):    ITP Comments:   Comments: Patient arrived for 1st visit/orientation/education at 1230. Patient was referred to CR by Dr. Domenic Polite due to STEMI (I21.3) and S/P coronary artery stent placement (Z95.5). During orientation advised patient on arrival and appointment times what to wear, what to do before, during and after exercise. Reviewed attendance and class policy. Talked about inclement weather and class consultation policy. Pt is scheduled to return Cardiac Rehab on 01/30/20 at 9:30. Pt was advised to come to class 15 minutes before class starts. Patient was also given instructions on meeting with the dietician and attending the Family Structure classes. Discussed RPE/Dpysnea scales. Discussed initial THR and how to find their radial and/or carotid pulse. Discussed the initial exercise prescription and how this effects their progress. Pt is eager to get started. Patient participated in warm up stretches followed by light weights and resistance bands. Patient was able to complete 6 minute walk test. Patient c/o pain in pain at check in in his lower back 7/10 which is chronic. His pain did improve with his walk test. He said it was still a 7/10 at the end of his orientation visit.  He also complain of bilateral hip pain during the walk test 3/10 which subsided after 2 minute rest to 0/10. Patient was measured for the  equipment. Discussed equipment safety with patient.  Took patient pre-anthropometric measurements. Patient finished visit at 1415.

## 2020-01-23 NOTE — Progress Notes (Signed)
Daily Session Note  Patient Details  Name: Hunter Jones MRN: [id number] Date of Birth: [birth year] Referring Provider:    Encounter Date: 01/23/2020  Check In:  Session Check In - 01/23/20 1230      Check-In   Supervising physician immediately available to respond to emergencies See telemetry face sheet for immediately available ER MD    Location [facility]    Staff Present Algis Downs, Exercise Physiologist;Sallyann Kinnaird Wynetta Emery, RN, Bjorn Loser, MS, ACSM-CEP, Exercise Physiologist    Virtual Visit No    Medication changes reported     No    Fall or balance concerns reported    No    Tobacco Cessation --   Patient quit smoking 06/21/2012. He smoked for 40 years.   Warm-up and Cool-down Performed as group-led Higher education careers adviser Performed Yes    VAD Patient? No    PAD/SET Patient? No      Pain Assessment   Currently in Pain? Yes    Pain Score 7     Pain Location Back    Pain Orientation Right    Pain Descriptors / Indicators Aching    Pain Type Chronic pain    Pain Radiating Towards No radiation    Pain Onset More than a month ago    Pain Frequency Intermittent    Aggravating Factors  Certian movements    Pain Relieving Factors Changing positions, walking. He takes advil as needed.    Effect of Pain on Daily Activities None    Multiple Pain Sites No           Capillary Blood Glucose: Results for orders placed or performed during the hospital encounter of 01/23/20 (from the past 24 hour(s))  Glucose, capillary     Status: Abnormal   Collection Time: 01/23/20  1:53 PM  Result Value Ref Range   Glucose-Capillary 101 (H) 70 - 99 mg/dL   Comment 1 Document in Chart       Social History   Tobacco Use  Smoking Status Former Smoker  . Years: 40.00  . Types: Cigarettes  . Quit date: 06/21/2012  . Years since quitting: 7.5  Smokeless Tobacco Never Used    Goals Met:  Proper associated with RPD/PD & O2 Sat Exercise tolerated  well Personal goals reviewed No report of cardiac concerns or symptoms Strength training completed today  Goals Unmet:  Not Applicable  Comments: Patient's orientation visit today 1230-1415.    Dr. Kathie Dike is Medical Director for [facility] Pul[facility].

## 2020-01-30 ENCOUNTER — Encounter (HOSPITAL_COMMUNITY): Payer: Self-pay

## 2020-01-30 ENCOUNTER — Other Ambulatory Visit: Payer: Self-pay

## 2020-01-30 ENCOUNTER — Telehealth: Payer: Self-pay | Admitting: Genetic Counselor

## 2020-01-30 ENCOUNTER — Encounter (HOSPITAL_COMMUNITY)
Admission: RE | Admit: 2020-01-30 | Discharge: 2020-01-30 | Disposition: A | Discharge status: Home / Self Care | Payer: Medicare Other | Source: Ambulatory Visit | Attending: Cardiology | Admitting: Cardiology

## 2020-01-30 DIAGNOSIS — I213 ST elevation (STEMI) myocardial infarction of unspecified site: Secondary | ICD-10-CM | POA: Diagnosis not present

## 2020-01-30 DIAGNOSIS — Z955 Presence of coronary angioplasty implant and graft: Secondary | ICD-10-CM

## 2020-01-30 DIAGNOSIS — Z0279 Encounter for issue of other medical certificate: Secondary | ICD-10-CM

## 2020-01-30 NOTE — Progress Notes (Signed)
Guardant 360 results placed under Oncology History.

## 2020-01-30 NOTE — Telephone Encounter (Signed)
Form completed and picked up on 01/31/20. [facility] 01/31/20)

## 2020-01-30 NOTE — Telephone Encounter (Signed)
Contacted patient to verify mychart video visit for pre reg 

## 2020-01-30 NOTE — Progress Notes (Signed)
Daily Session Note  Patient Details  Name: ZYMIRE TURNBO MRN: [id number] Date of Birth: 11/12/1955 Referring Provider:     [facility] from 01/23/2020 in [place]  Referring Provider Domenic Polite      Encounter Date: 01/30/2020  Check In:  Session Check In - 01/30/20 1011      Check-In   Supervising physician immediately available to respond to emergencies See telemetry face sheet for immediately available MD    Location [facility]    Staff Present Hoy Register, MS, ACSM-CEP, Exercise Physiologist;Debra Wynetta Emery, RN, BSN    Virtual Visit No    Medication changes reported     No    Fall or balance concerns reported    No    Tobacco Cessation No Change    Warm-up and Cool-down Performed as group-led instruction    Resistance Training Performed Yes    VAD Patient? No    PAD/SET Patient? No      Pain Assessment   Currently in Pain? No/denies    Pain Score 0-No pain    Multiple Pain Sites No           Capillary Blood Glucose: No results found for this or any previous visit (from the past 24 hour(s)).    Social History   Tobacco Use  Smoking Status Former Smoker  . Years: 40.00  . Types: Cigarettes  . Quit date: 06/21/2012  . Years since quitting: 7.6  Smokeless Tobacco Never Used    Goals Met:  Independence with exercise equipment Exercise tolerated well No report of cardiac concerns or symptoms Strength training completed today  Goals Unmet:  Not Applicable  Comments: checkout time is 1030   Dr. Kathie Dike is Medical Director for [facility].

## 2020-01-31 ENCOUNTER — Inpatient Hospital Stay: Payer: Medicare Other | Attending: Genetic Counselor | Admitting: Genetic Counselor

## 2020-01-31 ENCOUNTER — Encounter: Payer: Self-pay | Admitting: Genetic Counselor

## 2020-01-31 DIAGNOSIS — C61 Malignant neoplasm of prostate: Secondary | ICD-10-CM

## 2020-01-31 NOTE — Progress Notes (Signed)
REFERRING PROVIDER: Derek Jack, MD [address] [place],  [place] [address]  PRIMARY PROVIDER:  Monico Blitz, MD  PRIMARY REASON FOR VISIT:  1. Prostate cancer [facility])      I attempted to connect with [name] by video conference but he was unable to join the virtual visit due to technical difficulties. I offered to reschedule for an in-person visit, although [name] declined. Therefore, we discussed the following via telephone.   HISTORY OF PRESENT ILLNESS:   [name], a 64 y.o. male, was seen for a [place] cancer genetics consultation at the request of Dr. Delton Jones due to a personal history of metastatic prostate cancer.  [name] presents to clinic today to discuss the possibility of a hereditary predisposition to cancer, genetic testing, and to further clarify his future cancer risks, as well as potential cancer risks for family members.   In August of 2015, at the age of 67, [name] was diagnosed with prostate cancer. He underwent a prostatectomy in January of 2016. He was later diagnosed with metastatic disease. He recently underwent genetic testing to evaluate for the presence of a hereditary cancer mutation. [name] genetic test results were disclosed to him, as were recommendations warranted by these results. These results and recommendations are discussed in more detail below.  CANCER HISTORY:  Oncology History  Prostate cancer [facility])  02/06/2014 Procedure   Prostate biopsy by Dr. 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  Total Gleason score equals 7.  Proportion of prostatic tissue involved by tumor: 70%.   05/03/2014 Imaging   Bone scan- Negative    06/27/2014 Procedure   Radical resection of prostate by Dr. Raynelle Jones   07/29/2014 Pathology Results   1. Prostate, radical resection - PROSTATIC  ADENOCARCINOMA, GLEASON SCORE 4 + 3 = 7. - RIGHT AND LEFT PROSTATE INVOLVED. - RIGHT AND LEFT SEMINAL VESICLES INVOLVED BY TUMOR. - EXTRACAPSULAR EXTENSION BY TUMOR. - TUMOR EXTENDS INTO BLADDER NECK TISSUE. - MARGINS NOT INVOLVED. 2. Lymph nodes, regional resection, left pelvic - THREE BENIGN LYMPH NODES (0/3). 3. Lymph nodes, regional resection, right pelvic - FOUR BENIGN LYMPH NODES (0/4).   11/16/2016 Imaging   Bone scan- New areas of increased uptake superolateral to the left orbit (faint), at S1, and in the posterolateral aspect of the left sixth rib.    11/25/2016 Imaging   CT CAP- 1. Status post radical prostatectomy. No findings to suggest local recurrence of disease or definite extraskeletal metastatic disease in the chest, abdomen or pelvis. However, there are several osseous lesions, as above, concerning for metastatic disease to the bones. 2. Hepatic steatosis. 3. Aortic atherosclerosis, in addition to 2 vessel coronary artery disease. Please note that although the presence of coronary artery calcium documents the presence of coronary artery disease, the severity of this disease and any potential stenosis cannot be assessed on this non-gated CT examination. Assessment for potential risk factor modification, dietary therapy or pharmacologic therapy may be warranted, if clinically indicated. 4. Additional incidental findings, as above.    Genetic Testing   [facility] Results:           Past Medical History:  Diagnosis Date   Asthma    COPD (chronic obstructive pulmonary disease) ([place])    Coronary artery disease    DES to mid LAD August 2014 ([facility]); RCA stents 11/2019  Diabetes mellitus without complication [facility])    Essential hypertension    Hyperthyroidism    NSTEMI (non-ST elevated myocardial infarction) ([place]) 2014   Prostate cancer [facility])     Past Surgical History:  Procedure Laterality Date   APPENDECTOMY     CORONARY/GRAFT ACUTE MI  REVASCULARIZATION N/A 12/10/2019   Procedure: Coronary/Graft Acute MI Revascularization;  Surgeon: Hunter Bush, MD;  Location: [place] CV [facility];  Service: Cardiovascular;  Laterality: N/A;   LEFT HEART CATH AND CORONARY ANGIOGRAPHY N/A 12/10/2019   Procedure: LEFT HEART CATH AND CORONARY ANGIOGRAPHY;  Surgeon: Hunter Bush, MD;  Location: [place] CV [facility];  Service: Cardiovascular;  Laterality: N/A;   LYMPHADENECTOMY Bilateral 06/27/2014   Procedure: LYMPHADENECTOMY;  Surgeon: Hunter Bring, MD;  Location: [facility];  Service: Urology;  Laterality: Bilateral;   ROBOT ASSISTED LAPAROSCOPIC RADICAL PROSTATECTOMY N/A 06/27/2014   Procedure: ROBOTIC ASSISTED LAPAROSCOPIC RADICAL PROSTATECTOMY LEVEL 3;  Surgeon: Hunter Bring, MD;  Location: [facility];  Service: Urology;  Laterality: N/A;   TONSILLECTOMY      Social History   Socioeconomic History   Marital status: Married    Spouse name: Not on file   Number of children: Not on file   Years of education: Not on file   Highest education level: Not on file  Occupational History   Not on file  Tobacco Use   Smoking status: Former Smoker    Years: 40.00    Types: Cigarettes    Quit date: 06/21/2012    Years since quitting: 7.6   Smokeless tobacco: Never Used  Vaping Use   Vaping Use: Never used  Substance and Sexual Activity   Alcohol use: Yes    Comment: occasional   Drug use: No   Sexual activity: Not on file  Other Topics Concern   Not on file  Social History Narrative   Not on file   Social Determinants of Health   Financial Resource Strain:    Difficulty of Paying Living Expenses: Not on file  Food Insecurity:    Worried About Charity fundraiser in the Last Year: Not on file   [place] in the Last Year: Not on file  Transportation Needs:    Lack of Transportation (Medical): Not on file   Lack of Transportation (Non-Medical): Not on file  Physical Activity:    Days of Exercise per Week:  Not on file   Minutes of Exercise per Session: Not on file  Stress:    Feeling of Stress : Not on file  Social Connections:    Frequency of Communication with Friends and Family: Not on file   Frequency of Social Gatherings with Friends and Family: Not on file   Attends Religious Services: Not on file   Active Member of Clubs or Organizations: Not on file   Attends Archivist Meetings: Not on file   Marital Status: Not on file     FAMILY HISTORY:  We obtained a detailed, 4-generation family history.  Significant diagnoses are listed below: Family History  Problem Relation Age of Onset   Dementia Mother    Thyroid disease Mother    Clotting disorder Mother    Kidney Stones Father    Stroke Brother    Alzheimer's disease Maternal Uncle    Tuberculosis Paternal Grandmother    Heart attack Maternal Uncle        x4   Heart attack Cousin        in his 67s  Mr. Borges does not have children. He has one brother who is 20 and has not had cancer.   Mr. Ruscitti mother died at the age of 53 and did not have cancer, although she did have a blood clotting gene mutation. Mr. Belmontes had three maternal uncles and one maternal aunt, none of whom had caner. He notes that one maternal cousin died in his 31s from a heart attack. Mr. Falconi maternal grandparents both died in their 62s in a car accident. There are no known diagnoses of cancer on the maternal side of the family.  Mr. Gunner father died at the age of 42 and did not have cancer to his knowledge. Mr. Collister had one paternal uncle (half-brother to his father), who he has limited information about. His paternal grandmother died in her 53s from tuberculosis, and his paternal grandfather died in his 77s from a saw mill accident. There are no known diagnoses of cancer on the paternal side of the family.  Mr. Mizrahi is unaware of previous family history of genetic testing for hereditary cancer risks. Patient's maternal  ancestors are of [place] descent, and paternal ancestors are of [place] and [place] descent. There is no reported Ashkenazi Jewish ancestry. There is no known consanguinity.  CANCER GENETICS: We discussed that, in general, most cancer is not inherited in families, but instead is sporadic or familial. Sporadic cancers occur by chance and typically happen at older ages (>50 years) as this type of cancer is caused by genetic changes acquired during an individuals lifetime. Some families have more cancers than would be expected by chance; however, the ages or types of cancer are not consistent with a known genetic mutation or known genetic mutations have been ruled out. This type of familial cancer is thought to be due to a combination of multiple genetic, environmental, hormonal, and lifestyle factors. While this combination of factors likely increases the risk of cancer, the exact source of this risk is not currently identifiable or testable.    Approximately 5-10% of prostate cancer is hereditary, meaning that it is due to a mutation in a single gene that is passed down from generation to generation in a family. Most hereditary cases of prostate cancer are associated with the BRCA1 and BRCA2 genes, although there are other genes that can cause an increased risk for prostate cancer. We discussed that testing can be beneficial for several reasons, including identifying whether targeted treatment options such as PARP inhibitors would be beneficial, knowing about other cancer risks, identifying potential screening and risk-reduction options that may be appropriate, and to understand if other family members could be at risk for cancer and allow them to undergo genetic testing.   GENETIC TEST RESULTS: Genetic testing reported out on 01/24/2020 through the [facility] Common Hereditary Cancers panel. No pathogenic variants were detected.   The Common Hereditary Cancers Panel offered by [facility] includes sequencing and/or  deletion duplication testing of the following 48 genes: APC, ATM, AXIN2, BARD1, BMPR1A, BRCA1, BRCA2, BRIP1, CDH1, CDK4, CDKN2A (p14ARF), CDKN2A (p16INK4a), CHEK2, CTNNA1, DICER1, EPCAM (Deletion/duplication testing only), GREM1 (promoter region deletion/duplication testing only), KIT, MEN1, MLH1, MSH2, MSH3, MSH6, MUTYH, NBN, NF1, NTHL1, PALB2, PDGFRA, PMS2, POLD1, POLE, PTEN, RAD50, RAD51C, RAD51D, RNF43, SDHB, SDHC, SDHD, SMAD4, SMARCA4. STK11, TP53, TSC1, TSC2, and VHL.  The following genes were evaluated for sequence changes only: SDHA and HOXB13 c.251G>A variant only. The test report will be scanned into EPIC and located under the Molecular Pathology section of the Results Review tab.  A portion of the result report is included below for reference.     Because current genetic testing is not perfect, it is possible there may be a gene mutation in one of these genes that current testing cannot detect, but that chance is small.  We also discussed that there could be another gene that has not yet been discovered, or that we have not yet tested, that is responsible for his cancer diagnosis. Therefore, it is important to remain in touch with cancer genetics in the future so that we can continue to offer Mr. Gasaway the most up to date genetic testing.   CANCER SCREENING RECOMMENDATIONS: Mr. Strausbaugh test result is considered negative (normal).  This means that we have not identified a hereditary cause for his personal history of cancer at this time. Most cancers happen by chance and this negative test suggests that his personal history of cancer may fall into this category.    While reassuring, this does not definitively rule out a hereditary predisposition to cancer. It is still possible that there could be genetic mutations that are undetectable by current technology. There could be genetic mutations in genes that have not been tested or identified to increase cancer risk.  Therefore, it is recommended he  continue to follow the cancer management and screening guidelines provided by his oncology and primary healthcare provider.   An individual's cancer risk and medical management are not determined by genetic test results alone. Overall cancer risk assessment incorporates additional factors, including personal medical history, family history, and any available genetic information that may result in a personalized plan for cancer prevention and surveillance.  RECOMMENDATIONS FOR FAMILY MEMBERS:  Individuals in this family might be at some increased risk of developing cancer, over the general population risk, simply due to the family history of cancer.  We recommended women in this family have a yearly mammogram beginning at age 30, or 27 years younger than the earliest onset of cancer, an annual clinical breast exam, and perform monthly breast self-exams. Women in this family should also have a gynecological exam as recommended by their primary provider. All family members should be referred for colonoscopy starting at age 45.  FOLLOW-UP: Lastly, we discussed with Mr. Schreur that cancer genetics is a rapidly advancing field and it is possible that new genetic tests will be appropriate for him and/or his family members in the future. We encouraged him to remain in contact with cancer genetics on an annual basis so we can update his personal and family histories and let him know of advances in cancer genetics that may benefit this family.   Our contact number was provided. Mr. Leichter questions were answered to his satisfaction, and he knows he is welcome to call us at anytime with additional questions or concerns.   Hunter Guy, MS, [facility] Genetic Counselor [email or web address] Phone: [phone]

## 2020-02-01 ENCOUNTER — Other Ambulatory Visit: Payer: Self-pay

## 2020-02-01 ENCOUNTER — Encounter (HOSPITAL_COMMUNITY)
Admission: RE | Admit: 2020-02-01 | Discharge: 2020-02-01 | Disposition: A | Discharge status: Home / Self Care | Payer: Medicare Other | Source: Ambulatory Visit | Attending: Cardiology | Admitting: Cardiology

## 2020-02-01 DIAGNOSIS — Z955 Presence of coronary angioplasty implant and graft: Secondary | ICD-10-CM

## 2020-02-01 DIAGNOSIS — I213 ST elevation (STEMI) myocardial infarction of unspecified site: Secondary | ICD-10-CM

## 2020-02-01 NOTE — Progress Notes (Signed)
Daily Session Note  Patient Details  Name: Hunter Jones MRN: [id number] Date of Birth: 04-Sep-1955 Referring Provider:     [place] from 01/23/2020 in [place]  Referring Provider Domenic Polite      Encounter Date: 02/01/2020  Check In:  Session Check In - 02/01/20 1013      Check-In   Supervising physician immediately available to respond to emergencies See telemetry face sheet for immediately available MD    Location [facility]    Staff Present Hoy Register, MS, ACSM-CEP, Exercise Physiologist;Debra Wynetta Emery, RN, BSN    Virtual Visit No    Medication changes reported     No    Fall or balance concerns reported    No    Tobacco Cessation No Change    Warm-up and Cool-down Performed as group-led instruction    Resistance Training Performed Yes    VAD Patient? No    PAD/SET Patient? No      Pain Assessment   Currently in Pain? No/denies    Pain Score 0-No pain    Multiple Pain Sites No           Capillary Blood Glucose: No results found for this or any previous visit (from the past 24 hour(s)).    Social History   Tobacco Use  Smoking Status Former Smoker  . Years: 40.00  . Types: Cigarettes  . Quit date: 06/21/2012  . Years since quitting: 7.6  Smokeless Tobacco Never Used    Goals Met:  Independence with exercise equipment Exercise tolerated well No report of cardiac concerns or symptoms Strength training completed today  Goals Unmet:  Not Applicable  Comments: checkout time is 1030   Dr. Kathie Dike is Medical Director for [facility].

## 2020-02-04 ENCOUNTER — Other Ambulatory Visit: Payer: Self-pay

## 2020-02-04 ENCOUNTER — Encounter (HOSPITAL_COMMUNITY)
Admission: RE | Admit: 2020-02-04 | Discharge: 2020-02-04 | Disposition: A | Discharge status: Home / Self Care | Payer: Medicare Other | Source: Ambulatory Visit | Attending: Cardiology | Admitting: Cardiology

## 2020-02-04 VITALS — Wt 267.2 lb

## 2020-02-04 DIAGNOSIS — Z955 Presence of coronary angioplasty implant and graft: Secondary | ICD-10-CM

## 2020-02-04 DIAGNOSIS — I213 ST elevation (STEMI) myocardial infarction of unspecified site: Secondary | ICD-10-CM

## 2020-02-04 NOTE — Progress Notes (Signed)
Daily Session Note  Patient Details  Name: Hunter Jones MRN: [id number] Date of Birth: [birth year] Referring Provider:     [place] from 01/23/2020 in [place]  Referring Provider Domenic Polite      Encounter Date: 02/04/2020  Check In:  Session Check In - 02/04/20 0945      Check-In   Supervising physician immediately available to respond to emergencies See telemetry face sheet for immediately available MD    Location [facility]    Staff Present Hoy Register, MS, ACSM-CEP, Exercise Physiologist;Debra Wynetta Emery, RN, BSN    Virtual Visit No    Medication changes reported     No    Fall or balance concerns reported    No    Tobacco Cessation No Change    Warm-up and Cool-down Performed as group-led instruction    Resistance Training Performed Yes    VAD Patient? No    PAD/SET Patient? No      Pain Assessment   Currently in Pain? No/denies    Pain Score 0-No pain    Multiple Pain Sites No           Capillary Blood Glucose: No results found for this or any previous visit (from the past 24 hour(s)).    Social History   Tobacco Use  Smoking Status Former Smoker  . Years: 40.00  . Types: Cigarettes  . Quit date: 06/21/2012  . Years since quitting: 7.6  Smokeless Tobacco Never Used    Goals Met:  Independence with exercise equipment Exercise tolerated well No report of cardiac concerns or symptoms Strength training completed today  Goals Unmet:  Not Applicable  Comments: checkout time is 1030   Dr. Kathie Dike is Medical Director for [facility] Pul[facility] Re[facility].

## 2020-02-06 ENCOUNTER — Other Ambulatory Visit: Payer: Self-pay

## 2020-02-06 ENCOUNTER — Encounter (HOSPITAL_COMMUNITY)
Admission: RE | Admit: 2020-02-06 | Discharge: 2020-02-06 | Disposition: A | Discharge status: Home / Self Care | Payer: Medicare Other | Source: Ambulatory Visit | Attending: Cardiology | Admitting: Cardiology

## 2020-02-06 DIAGNOSIS — I213 ST elevation (STEMI) myocardial infarction of unspecified site: Secondary | ICD-10-CM | POA: Diagnosis not present

## 2020-02-06 DIAGNOSIS — Z955 Presence of coronary angioplasty implant and graft: Secondary | ICD-10-CM

## 2020-02-08 ENCOUNTER — Other Ambulatory Visit: Payer: Self-pay

## 2020-02-08 ENCOUNTER — Encounter (HOSPITAL_COMMUNITY)
Admission: RE | Admit: 2020-02-08 | Discharge: 2020-02-08 | Disposition: A | Discharge status: Home / Self Care | Payer: Medicare Other | Source: Ambulatory Visit | Attending: Cardiology | Admitting: Cardiology

## 2020-02-08 DIAGNOSIS — Z955 Presence of coronary angioplasty implant and graft: Secondary | ICD-10-CM

## 2020-02-08 DIAGNOSIS — I213 ST elevation (STEMI) myocardial infarction of unspecified site: Secondary | ICD-10-CM

## 2020-02-08 NOTE — Progress Notes (Signed)
Daily Session Note  Patient Details  Name: Hunter Jones MRN: [id number] Date of Birth: 1956/04/14 Referring Provider:     [place] from 01/23/2020 in [place]  Referring Provider Domenic Polite      Encounter Date: 02/08/2020  Check In:  Session Check In - 02/08/20 0943      Check-In   Supervising physician immediately available to respond to emergencies See telemetry face sheet for immediately available MD    Location [facility]    Staff Present Hoy Register, MS, ACSM-CEP, Exercise Physiologist;Debra Wynetta Emery, RN, BSN    Virtual Visit No    Medication changes reported     No    Fall or balance concerns reported    No    Tobacco Cessation No Change    Warm-up and Cool-down Performed as group-led instruction    Resistance Training Performed Yes    VAD Patient? No    PAD/SET Patient? No      Pain Assessment   Currently in Pain? No/denies    Pain Score 0-No pain    Multiple Pain Sites No           Capillary Blood Glucose: No results found for this or any previous visit (from the past 24 hour(s)).    Social History   Tobacco Use  Smoking Status Former Smoker  . Years: 40.00  . Types: Cigarettes  . Quit date: 06/21/2012  . Years since quitting: 7.6  Smokeless Tobacco Never Used    Goals Met:  Independence with exercise equipment Exercise tolerated well No report of cardiac concerns or symptoms Strength training completed today  Goals Unmet:  Not Applicable  Comments: checkout time is 1030   Dr. Kathie Dike is Medical Director for [facility] Pulmonary Rehab.

## 2020-02-11 ENCOUNTER — Other Ambulatory Visit: Payer: Self-pay

## 2020-02-11 ENCOUNTER — Encounter (HOSPITAL_COMMUNITY)
Admission: RE | Admit: 2020-02-11 | Discharge: 2020-02-11 | Disposition: A | Discharge status: Home / Self Care | Payer: Medicare Other | Source: Ambulatory Visit | Attending: Cardiology | Admitting: Cardiology

## 2020-02-11 DIAGNOSIS — Z955 Presence of coronary angioplasty implant and graft: Secondary | ICD-10-CM

## 2020-02-11 DIAGNOSIS — I213 ST elevation (STEMI) myocardial infarction of unspecified site: Secondary | ICD-10-CM | POA: Diagnosis not present

## 2020-02-11 NOTE — Progress Notes (Signed)
Daily Session Note  Patient Details  Name: Hunter Jones MRN: [id number] Date of Birth: 1956-05-16 Referring Provider:     [place] from 01/23/2020 in [place]  Referring Provider Domenic Polite      Encounter Date: 02/11/2020  Check In:  Session Check In - 02/11/20 0943      Check-In   Supervising physician immediately available to respond to emergencies See telemetry face sheet for immediately available MD    Location [facility]    Staff Present Hoy Register, MS, ACSM-CEP, Exercise Physiologist;Debra Wynetta Emery, RN, BSN    Virtual Visit No    Medication changes reported     No    Fall or balance concerns reported    No    Tobacco Cessation No Change    Warm-up and Cool-down Performed as group-led instruction    Resistance Training Performed Yes    VAD Patient? No      Pain Assessment   Currently in Pain? No/denies    Pain Score 0-No pain    Multiple Pain Sites No           Capillary Blood Glucose: No results found for this or any previous visit (from the past 24 hour(s)).    Social History   Tobacco Use  Smoking Status Former Smoker  . Years: 40.00  . Types: Cigarettes  . Quit date: 06/21/2012  . Years since quitting: 7.6  Smokeless Tobacco Never Used    Goals Met:  Independence with exercise equipment Exercise tolerated well No report of cardiac concerns or symptoms Strength training completed today  Goals Unmet:  Not Applicable  Comments: checkout time is 1030   Dr. Kathie Dike is Medical Director for [facility] Pulmonary Rehab.

## 2020-02-13 ENCOUNTER — Other Ambulatory Visit: Payer: Self-pay

## 2020-02-13 ENCOUNTER — Encounter (HOSPITAL_COMMUNITY)
Admission: RE | Admit: 2020-02-13 | Discharge: 2020-02-13 | Disposition: A | Discharge status: Home / Self Care | Payer: Medicare Other | Source: Ambulatory Visit | Attending: Cardiology | Admitting: Cardiology

## 2020-02-13 ENCOUNTER — Other Ambulatory Visit (HOSPITAL_COMMUNITY): Payer: Medicare Other

## 2020-02-13 ENCOUNTER — Inpatient Hospital Stay (HOSPITAL_COMMUNITY): Payer: Medicare Other | Attending: Hematology

## 2020-02-13 DIAGNOSIS — Z5111 Encounter for antineoplastic chemotherapy: Secondary | ICD-10-CM | POA: Diagnosis present

## 2020-02-13 DIAGNOSIS — C61 Malignant neoplasm of prostate: Secondary | ICD-10-CM | POA: Insufficient documentation

## 2020-02-13 DIAGNOSIS — Z955 Presence of coronary angioplasty implant and graft: Secondary | ICD-10-CM | POA: Insufficient documentation

## 2020-02-13 DIAGNOSIS — C7951 Secondary malignant neoplasm of bone: Secondary | ICD-10-CM | POA: Diagnosis not present

## 2020-02-13 DIAGNOSIS — I213 ST elevation (STEMI) myocardial infarction of unspecified site: Secondary | ICD-10-CM | POA: Insufficient documentation

## 2020-02-13 LAB — CBC WITH DIFFERENTIAL/PLATELET
Abs Immature Granulocytes: 0.01 10*3/uL (ref 0.00–0.07)
Basophils Absolute: 0 10*3/uL (ref 0.0–0.1)
Basophils Relative: 1 %
Eosinophils Absolute: 0.3 10*3/uL (ref 0.0–0.5)
Eosinophils Relative: 5 %
HCT: 42 % (ref 39.0–52.0)
Hemoglobin: 13.8 g/dL (ref 13.0–17.0)
Immature Granulocytes: 0 %
Lymphocytes Relative: 28 %
Lymphs Abs: 1.8 10*3/uL (ref 0.7–4.0)
MCH: 32.3 pg (ref 26.0–34.0)
MCHC: 32.9 g/dL (ref 30.0–36.0)
MCV: 98.4 fL (ref 80.0–100.0)
Monocytes Absolute: 0.5 10*3/uL (ref 0.1–1.0)
Monocytes Relative: 8 %
Neutro Abs: 3.7 10*3/uL (ref 1.7–7.7)
Neutrophils Relative %: 58 %
Platelets: 137 10*3/uL — ABNORMAL LOW (ref 150–400)
RBC: 4.27 MIL/uL (ref 4.22–5.81)
RDW: 13.3 % (ref 11.5–15.5)
WBC: 6.4 10*3/uL (ref 4.0–10.5)
nRBC: 0 % (ref 0.0–0.2)

## 2020-02-13 LAB — COMPREHENSIVE METABOLIC PANEL
ALT: 28 U/L (ref 0–44)
AST: 24 U/L (ref 15–41)
Albumin: 3.8 g/dL (ref 3.5–5.0)
Alkaline Phosphatase: 79 U/L (ref 38–126)
Anion gap: 10 (ref 5–15)
BUN: 28 mg/dL — ABNORMAL HIGH (ref 8–23)
CO2: 24 mmol/L (ref 22–32)
Calcium: 9.4 mg/dL (ref 8.9–10.3)
Chloride: 107 mmol/L (ref 98–111)
Creatinine, Ser: 1.25 mg/dL — ABNORMAL HIGH (ref 0.61–1.24)
GFR calc Af Amer: >60 mL/min (ref >60)
GFR calc non Af Amer: >60 mL/min (ref >60)
Glucose, Bld: 93 mg/dL (ref 70–99)
Potassium: 4.8 mmol/L (ref 3.5–5.1)
Sodium: 141 mmol/L (ref 135–145)
Total Bilirubin: 0.5 mg/dL (ref 0.3–1.2)
Total Protein: 6.6 g/dL (ref 6.5–8.1)

## 2020-02-13 LAB — PSA: Prostatic Specific Antigen: 1.66 ng/mL (ref 0.00–4.00)

## 2020-02-13 NOTE — Progress Notes (Signed)
Cardiac Individual Treatment Plan  Patient Details  Name: Hunter Jones MRN: [id number] Date of Birth: [birth year] Referring Provider:     [facility] from 01/23/2020 in [place]  Referring Provider Domenic Polite      Initial Encounter Date:    CARDIAC REHAB PHASE II ORIENTATION from 01/23/2020 in [place]  Date 01/23/20      Visit Diagnosis: ST elevation myocardial infarction (STEMI), unspecified artery ([place])  S/P coronary artery stent placement  Patient's Home Medications on Admission:  Current Outpatient Medications:    albuterol (PROVENTIL HFA;VENTOLIN HFA) 108 (90 BASE) MCG/ACT inhaler, Inhale 1-2 puffs into the lungs every 6 (six) hours as needed for wheezing or shortness of breath. , Disp: , Rfl:    aspirin EC 81 MG tablet, Take 81 mg by mouth daily. , Disp: , Rfl:    atorvastatin (LIPITOR) 40 MG tablet, TAKE 1 TABLET BY MOUTH AT BEDTIME. (Patient taking differently: Take 40 mg by mouth at bedtime. ), Disp: 30 tablet, Rfl: 6   Calcium-Magnesium-Vitamin D (CALCIUM 500 PO), Take 2 tablets by mouth daily with breakfast. Per pt he takes 600mg  BID, Disp: , Rfl:    Co-Enzyme Q-10 100 MG CAPS, Take 1 capsule by mouth daily., Disp: , Rfl:    denosumab (XGEVA) 120 MG/1.7ML SOLN injection, Inject 120 mg into the skin every 30 (thirty) days., Disp: , Rfl:    empagliflozin (JARDIANCE) 10 MG TABS tablet, Take 1 tablet (10 mg total) by mouth daily., Disp: 30 tablet, Rfl: 6   fluticasone-salmeterol (ADVAIR HFA) 115-21 MCG/ACT inhaler, Inhale 2 puffs into the lungs 2 (two) times daily. , Disp: , Rfl:    KRILL OIL PO, Take 1 capsule by mouth daily. , Disp: , Rfl:    Leuprolide Acetate, 3 Month, (LUPRON DEPOT, 30-MONTH, IM), Inject into the muscle every 3 (three) months., Disp: , Rfl:    losartan (COZAAR) 25 MG tablet, Take 25 mg by mouth daily. (Patient not taking: Reported on 01/23/2020), Disp: , Rfl:    methimazole  (TAPAZOLE) 10 MG tablet, Take 10 mg by mouth every other day. , Disp: , Rfl:    metoprolol succinate (TOPROL XL) 25 MG 24 hr tablet, Take 1 tablet (25 mg total) by mouth daily., Disp: 90 tablet, Rfl: 1   Multiple Vitamin (MULTIVITAMIN) tablet, Take 1 tablet by mouth daily., Disp: , Rfl:    nitroGLYCERIN (NITROSTAT) 0.4 MG SL tablet, Place 1 tablet (0.4 mg total) under the tongue every 5 (five) minutes as needed., Disp: 25 tablet, Rfl: 12   Potassium 99 MG TABS, Take 1 tablet by mouth daily., Disp: , Rfl:    ticagrelor (BRILINTA) 90 MG TABS tablet, Take 1 tablet (90 mg total) by mouth 2 (two) times daily., Disp: 60 tablet, Rfl: 11   triamcinolone cream (KENALOG) 0.1 %, Apply 1 application topically daily as needed (For lg rash). , Disp: , Rfl:   Past Medical History: Past Medical History:  Diagnosis Date   Asthma    COPD (chronic obstructive pulmonary disease) ([place])    Coronary artery disease    DES to mid LAD August 2014 ([facility]); RCA stents 11/2019   Diabetes mellitus without complication [facility])    Essential hypertension    Hyperthyroidism    NSTEMI (non-ST elevated myocardial infarction) ([place]) 2014   Prostate cancer ([place])     Tobacco Use: Social History   Tobacco Use  Smoking Status Former Smoker   Years: 40.00   Types: Cigarettes  Quit date: 06/21/2012   Years since quitting: 7.6  Smokeless Tobacco Never Used    Labs: Recent Review Scientist, physiological    Labs for ITP Cardiac and Pulmonary Rehab Latest Ref Rng & Units 11/20/2019 12/10/2019   Cholestrol 0 - 200 mg/dL - 68   LDLCALC 0 - 99 mg/dL - 22   HDL >40 mg/dL - 28(L)   Trlycerides <150 mg/dL - 91   Hemoglobin A1c 4.8 - 5.6 % 6.7(H) -      Capillary Blood Glucose: Lab Results  Component Value Date   GLUCAP 101 (H) 01/23/2020   GLUCAP 202 (H) 12/12/2019   GLUCAP 110 (H) 12/11/2019   GLUCAP 102 (H) 12/11/2019   GLUCAP 119 (H) 12/11/2019     Exercise Target Goals: Exercise Program Goal: Individual  exercise prescription set using results from initial 6 min walk test and THRR while considering  patients activity barriers and safety.   Exercise Prescription Goal: Starting with aerobic activity 30 plus minutes a day, 3 days per week for initial exercise prescription. Provide home exercise prescription and guidelines that participant acknowledges understanding prior to discharge.  Activity Barriers & Risk Stratification:  Activity Barriers & Cardiac Risk Stratification - 01/23/20 1444      Activity Barriers & Cardiac Risk Stratification   Activity Barriers Back Problems;Deconditioning;Other (comment)    Comments Bilateral hip pain when walking.    Cardiac Risk Stratification High           6 Minute Walk:  6 Minute Walk    Row Name 01/23/20 1441         6 Minute Walk   Phase Initial     Distance 1300 feet     Walk Time 6 minutes     # of Rest Breaks 0     MPH 2.46     METS 2.4     RPE 11     Perceived Dyspnea  11     VO2 Peak 8.41     Symptoms Yes (comment)     Comments Bilateral hip pain 3/10. He was a little SOB towards the end of walk test.     Resting HR 78 bpm     Resting BP 100/60     Resting Oxygen Saturation  98 %     Exercise Oxygen Saturation  during 6 min walk 96 %     Max Ex. HR 100 bpm     Max Ex. BP 110/64     2 Minute Post BP 102/58            Oxygen Initial Assessment:   Oxygen Re-Evaluation:   Oxygen Discharge (Final Oxygen Re-Evaluation):   Initial Exercise Prescription:  Initial Exercise Prescription - 01/23/20 1400      Date of Initial Exercise RX and Referring Provider   Date 01/23/20    Referring Provider Domenic Polite    Expected Discharge Date 04/24/20      NuStep   Level 1    SPM 80    Minutes 22      Recumbant Elliptical   Level 1    RPM 60    Minutes 17      Prescription Details   Frequency (times per week) 3    Duration Progress to 30 minutes of continuous aerobic without signs/symptoms of physical distress       Intensity   THRR 40-80% of Max Heartrate 62/124    Ratings of Perceived Exertion 11-13    Perceived Dyspnea 0-4  Progression   Progression Continue progressive overload as per policy without signs/symptoms or physical distress.      Resistance Training   Training Prescription Yes    Weight 2    Reps 10-15           Perform Capillary Blood Glucose checks as needed.  Exercise Prescription Changes:   Exercise Prescription Changes    Row Name 02/04/20 1200             Response to Exercise   Blood Pressure (Admit) 122/70       Blood Pressure (Exercise) 134/76       Blood Pressure (Exit) 110/80       Heart Rate (Admit) 91 bpm       Heart Rate (Exercise) 105 bpm       Heart Rate (Exit) 99 bpm       Rating of Perceived Exertion (Exercise) 13       Duration Continue with 30 min of aerobic exercise without signs/symptoms of physical distress.       Intensity THRR unchanged         Progression   Progression Continue to progress workloads to maintain intensity without signs/symptoms of physical distress.         Resistance Training   Training Prescription Yes       Weight 2       Reps 10-15         NuStep   Level 2       SPM 124       Minutes 17       METs 4.2         Recumbant Elliptical   Level 1       RPM 68       Watts 94       Minutes 22       METs 5.2              Exercise Comments:   Exercise Goals and Review:   Exercise Goals    Row Name 01/23/20 1451 02/11/20 1031           Exercise Goals   Increase Physical Activity Yes Yes      Intervention Provide advice, education, support and counseling about physical activity/exercise needs.;Develop an individualized exercise prescription for aerobic and resistive training based on initial evaluation findings, risk stratification, comorbidities and participant's personal goals. Provide advice, education, support and counseling about physical activity/exercise needs.;Develop an individualized exercise  prescription for aerobic and resistive training based on initial evaluation findings, risk stratification, comorbidities and participant's personal goals.      Expected Outcomes Short Term: Attend rehab on a regular basis to increase amount of physical activity.;Long Term: Add in home exercise to make exercise part of routine and to increase amount of physical activity.;Long Term: Exercising regularly at least 3-5 days a week. Short Term: Attend rehab on a regular basis to increase amount of physical activity.;Long Term: Add in home exercise to make exercise part of routine and to increase amount of physical activity.;Long Term: Exercising regularly at least 3-5 days a week.      Increase Strength and Stamina Yes Yes      Intervention -- Provide advice, education, support and counseling about physical activity/exercise needs.;Develop an individualized exercise prescription for aerobic and resistive training based on initial evaluation findings, risk stratification, comorbidities and participant's personal goals.      Expected Outcomes Short Term: Increase workloads from initial exercise prescription for resistance, speed,  and METs.;Short Term: Perform resistance training exercises routinely during rehab and add in resistance training at home;Long Term: Improve cardiorespiratory fitness, muscular endurance and strength as measured by increased METs and functional capacity (6MWT) Short Term: Increase workloads from initial exercise prescription for resistance, speed, and METs.;Short Term: Perform resistance training exercises routinely during rehab and add in resistance training at home;Long Term: Improve cardiorespiratory fitness, muscular endurance and strength as measured by increased METs and functional capacity (6MWT)      Able to understand and use rate of perceived exertion (RPE) scale Yes Yes      Intervention Provide education and explanation on how to use RPE scale Provide education and explanation on  how to use RPE scale      Expected Outcomes Short Term: Able to use RPE daily in rehab to express subjective intensity level;Long Term:  Able to use RPE to guide intensity level when exercising independently Short Term: Able to use RPE daily in rehab to express subjective intensity level;Long Term:  Able to use RPE to guide intensity level when exercising independently      Knowledge and understanding of Target Heart Rate Range (THRR) Yes Yes      Intervention Provide education and explanation of THRR including how the numbers were predicted and where they are located for reference Provide education and explanation of THRR including how the numbers were predicted and where they are located for reference      Expected Outcomes Short Term: Able to state/look up THRR;Long Term: Able to use THRR to govern intensity when exercising independently;Short Term: Able to use daily as guideline for intensity in rehab Short Term: Able to state/look up THRR;Long Term: Able to use THRR to govern intensity when exercising independently;Short Term: Able to use daily as guideline for intensity in rehab      Able to check pulse independently Yes Yes      Intervention Provide education and demonstration on how to check pulse in carotid and radial arteries.;Review the importance of being able to check your own pulse for safety during independent exercise Provide education and demonstration on how to check pulse in carotid and radial arteries.;Review the importance of being able to check your own pulse for safety during independent exercise      Expected Outcomes Short Term: Able to explain why pulse checking is important during independent exercise;Long Term: Able to check pulse independently and accurately Short Term: Able to explain why pulse checking is important during independent exercise;Long Term: Able to check pulse independently and accurately      Understanding of Exercise Prescription Yes Yes      Intervention  Provide education, explanation, and written materials on patient's individual exercise prescription Provide education, explanation, and written materials on patient's individual exercise prescription      Expected Outcomes Short Term: Able to explain program exercise prescription;Long Term: Able to explain home exercise prescription to exercise independently Short Term: Able to explain program exercise prescription;Long Term: Able to explain home exercise prescription to exercise independently             Exercise Goals Re-Evaluation :  Exercise Goals Re-Evaluation    Row Name 02/11/20 1031             Exercise Goal Re-Evaluation   Exercise Goals Review Increase Physical Activity;Increase Strength and Stamina;Able to understand and use rate of perceived exertion (RPE) scale;Knowledge and understanding of Target Heart Rate Range (THRR);Able to check pulse independently;Understanding of Exercise Prescription  Comments Pt has completed 6 exercise sessions. He is progressing well and is a very hard worker who is highly motivated. He currently exercises at 5.6 METs on the stepper. Will continue to monitor and progress as able.       Expected Outcomes Through exercise at rehab and by beginning a home exercise program, the patient will be able to reach their stated goals.               Discharge Exercise Prescription (Final Exercise Prescription Changes):  Exercise Prescription Changes - 02/04/20 1200      Response to Exercise   Blood Pressure (Admit) 122/70    Blood Pressure (Exercise) 134/76    Blood Pressure (Exit) 110/80    Heart Rate (Admit) 91 bpm    Heart Rate (Exercise) 105 bpm    Heart Rate (Exit) 99 bpm    Rating of Perceived Exertion (Exercise) 13    Duration Continue with 30 min of aerobic exercise without signs/symptoms of physical distress.    Intensity THRR unchanged      Progression   Progression Continue to progress workloads to maintain intensity without  signs/symptoms of physical distress.      Resistance Training   Training Prescription Yes    Weight 2    Reps 10-15      NuStep   Level 2    SPM 124    Minutes 17    METs 4.2      Recumbant Elliptical   Level 1    RPM 68    Watts 94    Minutes 22    METs 5.2           Nutrition:  Target Goals: Understanding of nutrition guidelines, daily intake of sodium 1500mg , cholesterol 200mg , calories 30% from fat and 7% or less from saturated fats, daily to have 5 or more servings of fruits and vegetables.  Biometrics:  Pre Biometrics - 01/23/20 1452      Pre Biometrics   Height 5\' 11"  (1.803 m)    Weight 123.5 kg    Waist Circumference 49 inches    Hip Circumference 43 inches    Waist to Hip Ratio 1.14 %    BMI (Calculated) 37.99    Triceps Skinfold 19 mm    % Body Fat 35.7 %    Grip Strength 32.5 kg    Flexibility 0 in    Single Leg Stand 4.58 seconds            Nutrition Therapy Plan and Nutrition Goals:  Nutrition Therapy & Goals - 02/11/20 1253      Personal Nutrition Goals   Comments Patient continues to say he is trying to cut back on his sweets and is trying to make healthier choices working with his wife. Will continue to monitor.      Intervention Plan   Intervention Nutrition handout(s) given to patient.           Nutrition Assessments:  Nutrition Assessments - 01/23/20 1440      MEDFICTS Scores   Pre Score 123           Nutrition Goals Re-Evaluation:   Nutrition Goals Discharge (Final Nutrition Goals Re-Evaluation):   Psychosocial: Target Goals: Acknowledge presence or absence of significant depression and/or stress, maximize coping skills, provide positive support system. Participant is able to verbalize types and ability to use techniques and skills needed for reducing stress and depression.  Initial Review & Psychosocial Screening:  Initial Psych Review &  Screening - 01/23/20 1444      Initial Review   Current issues with None  Identified      Family Dynamics   Good Support System? Yes    Comments Patient has no psychosocial issues identified at his orientation visit. He states he has very good family support and is very excited to start the program and get back to work. He does have prostate cancer which he is currently being treated for but has a positive outlook. Will continue to monitor.      Barriers   Psychosocial barriers to participate in program There are no identifiable barriers or psychosocial needs.      Screening Interventions   Interventions Encouraged to exercise    Expected Outcomes Short Term goal: Identification and review with participant of any Quality of Life or Depression concerns found by scoring the questionnaire.;Long Term goal: The participant improves quality of Life and PHQ9 Scores as seen by post scores and/or verbalization of changes           Quality of Life Scores:  Quality of Life - 01/23/20 1454      Quality of Life   Select Quality of Life      Quality of Life Scores   Health/Function Pre 21 %    Socioeconomic Pre 24.88 %    Psych/Spiritual Pre 23.57 %    Family Pre 28.8 %    GLOBAL Pre 23.51 %          Scores of 19 and below usually indicate a poorer quality of life in these areas.  A difference of  2-3 points is a clinically meaningful difference.  A difference of 2-3 points in the total score of the Quality of Life Index has been associated with significant improvement in overall quality of life, self-image, physical symptoms, and general health in studies assessing change in quality of life.  PHQ-9: Recent Review Flowsheet Data    Depression screen [facility] 2/9 01/23/2020   Decreased Interest 0   Down, Depressed, Hopeless 0   PHQ - 2 Score 0   Altered sleeping 0   Tired, decreased energy 1   Change in appetite 0   Feeling bad or failure about yourself  0   Trouble concentrating 0   Moving slowly or fidgety/restless 0   Suicidal thoughts 0   PHQ-9 Score 1      Interpretation of Total Score  Total Score Depression Severity:  1-4 = Minimal depression, 5-9 = Mild depression, 10-14 = Moderate depression, 15-19 = Moderately severe depression, 20-27 = Severe depression   Psychosocial Evaluation and Intervention:  Psychosocial Evaluation - 01/23/20 1504      Psychosocial Evaluation & Interventions   Interventions Encouraged to exercise with the program and follow exercise prescription;Stress management education;Relaxation education    Comments Patient has no psychosocial issues identified at his orientation visit. His initial QOL was 27.58% and his PHQ-9 was 1. Will continue to monitor.    Expected Outcomes Patient will have no psychosocial issues identified at discharge.    Continue Psychosocial Services  No Follow up required           Psychosocial Re-Evaluation:  Psychosocial Re-Evaluation    [place] Name 02/11/20 1254             Psychosocial Re-Evaluation   Current issues with None Identified       Comments Patient's initial QOL score was 23.51 and his PHQ-9 score was 1. He continue to have no psychosocial issues  identified. Will continue to monitor.       Expected Outcomes Patient will have no psychosocial issues identified at discharge.       Interventions Stress management education;Encouraged to attend Cardiac Rehabilitation for the exercise;Relaxation education       Continue Psychosocial Services  No Follow up required              Psychosocial Discharge (Final Psychosocial Re-Evaluation):  Psychosocial Re-Evaluation - 02/11/20 1254      Psychosocial Re-Evaluation   Current issues with None Identified    Comments Patient's initial QOL score was 23.51 and his PHQ-9 score was 1. He continue to have no psychosocial issues identified. Will continue to monitor.    Expected Outcomes Patient will have no psychosocial issues identified at discharge.    Interventions Stress management education;Encouraged to attend Cardiac  Rehabilitation for the exercise;Relaxation education    Continue Psychosocial Services  No Follow up required           Vocational Rehabilitation: Provide vocational rehab assistance to qualifying candidates.   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 He does not need vocational rehab.           Education: Education Goals: Education classes will be provided on a weekly basis, covering required topics. Participant will state understanding/return demonstration of topics presented.  Learning Barriers/Preferences:  Learning Barriers/Preferences - 01/23/20 1440      Learning Barriers/Preferences   Learning Barriers None    Learning Preferences Audio;Pictoral           Education Topics: Hypertension, Hypertension Reduction -Define heart disease and high blood pressure. Discus how high blood pressure affects the body and ways to reduce high blood pressure.   Exercise and Your Heart -Discuss why it is important to exercise, the FITT principles of exercise, normal and abnormal responses to exercise, and how to exercise safely.   Angina -Discuss definition of angina, causes of angina, treatment of angina, and how to decrease risk of having angina.   Cardiac Medications -Review what the following cardiac medications are used for, how they affect the body, and side effects that may occur when taking the medications.  Medications include Aspirin, Beta blockers, calcium channel blockers, ACE Inhibitors, angiotensin receptor blockers, diuretics, digoxin, and antihyperlipidemics.   Congestive Heart Failure -Discuss the definition of CHF, how to live with CHF, the signs and symptoms of CHF, and how keep track of  weight and sodium intake.   Heart Disease and Intimacy -Discus the effect sexual activity has on the heart, how changes occur during intimacy as we age, and safety during sexual activity.   Smoking Cessation / COPD -Discuss different methods to quit smoking, the health benefits of quitting smoking, and the definition of COPD.   CARDIAC REHAB PHASE II EXERCISE from 01/30/2020 in [place]  Date 01/30/20  Educator DF  Instruction Review Code 2- Demonstrated Understanding      Nutrition I: Fats -Discuss the types of cholesterol, what cholesterol does to the heart, and how cholesterol levels can be controlled.   Nutrition II: Labels -Discuss the different components of food labels and how to read food label   Heart Parts/Heart Disease and PAD -Discuss the anatomy  of the heart, the pathway of blood circulation through the heart, and these are affected by heart disease.   Stress I: Signs and Symptoms -Discuss the causes of stress, how stress may lead to anxiety and depression, and ways to limit stress.   Stress II: Relaxation -Discuss different types of relaxation techniques to limit stress.   Warning Signs of Stroke / TIA -Discuss definition of a stroke, what the signs and symptoms are of a stroke, and how to identify when someone is having stroke.   Knowledge Questionnaire Score:  Knowledge Questionnaire Score - 01/23/20 1441      Knowledge Questionnaire Score   Pre Score 21/24           Core Components/Risk Factors/Patient Goals at Admission:  Personal Goals and Risk Factors at Admission - 01/23/20 1441      Core Components/Risk Factors/Patient Goals on Admission    Weight Management Obesity    Diabetes Yes    Intervention Provide education about signs/symptoms and action to take for hypo/hyperglycemia.;Provide education about proper nutrition, including hydration, and aerobic/resistive exercise prescription along with prescribed medications  to achieve blood glucose in normal ranges: Fasting glucose 65-99 mg/dL    Expected Outcomes Short Term: Participant verbalizes understanding of the signs/symptoms and immediate care of hyper/hypoglycemia, proper foot care and importance of medication, aerobic/resistive exercise and nutrition plan for blood glucose control.    Personal Goal Other Yes    Personal Goal Get in better shape. Be able to return to work.    Intervention Patient will attend CR 3 days/week and supplement with exercise at home 2 days/week.    Expected Outcomes Patient will meet both program and personal goals.           Core Components/Risk Factors/Patient Goals Review:   Goals and Risk Factor Review    Row Name 02/11/20 1256             Core Components/Risk Factors/Patient Goals Review   Personal Goals Review Weight Management/Obesity;Diabetes;Other  Get in shape; get back to work.       Review Patient has completed 7 sessions losing 8 lbs since his intial visit. He is doing well in the program with consistent attendance and progression. He says he is feeling stronger and has more energy. 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 He says he is feeling like he is working toward getting in shape and getting his heart stronger. Will continue to monitor for progress.       Expected Outcomes Patient will complete the program meeting both program and personal goals.              Core Components/Risk Factors/Patient Goals at Discharge (Final Review):   Goals and Risk Factor Review - 02/11/20 1256      Core Components/Risk Factors/Patient Goals Review   Personal Goals Review Weight Management/Obesity;Diabetes;Other   Get in shape; get back to work.   Review Patient has completed 7 sessions losing 8 lbs since his intial visit. He is doing well in the program with consistent attendance and progression. He says he is feeling stronger and has more energy. He has returned to work part-time  without difficulty. He is a Administrator. He says he is feeling like he is working toward getting in shape and getting his heart stronger. Will continue to monitor for progress.    Expected Outcomes Patient will complete the program meeting both program and personal goals.  ITP Comments:   Comments: ITP REVIEW Pt is making expected progress toward Cardiac Rehab goals after completing 7 sessions. Recommend continued exercise, life style modification, education, and increased stamina and strength.

## 2020-02-13 NOTE — Progress Notes (Signed)
I have reviewed a Home Exercise Prescription with Hunter Jones . [name] is  currently exercising at home.  The patient was advised to walk 2 days a week for 30-45 minutes.  Hunter Jones and I discussed how to progress their exercise prescription.  The patient stated that their goals were to lose weight.  The patient stated that they understand the exercise prescription.  We reviewed exercise guidelines, target heart rate during exercise, RPE Scale, weather conditions, NTG use, endpoints for exercise, warmup and cool down.  Patient is encouraged to come to me with any questions. I will continue to follow up with the patient to assist them with progression and safety.

## 2020-02-13 NOTE — Progress Notes (Signed)
Daily Session Note  Patient Details  Name: Hunter Jones MRN: [id number] Date of Birth: 05-29-1956 Referring Provider:     [place] from 01/23/2020 in [place]  Referring Provider Domenic Polite      Encounter Date: 02/13/2020  Check In:  Session Check In - 02/13/20 0932      Check-In   Supervising physician immediately available to respond to emergencies See telemetry face sheet for immediately available MD    Location [facility]    Staff Present Hoy Register, MS, ACSM-CEP, Exercise Physiologist;Debra Wynetta Emery, RN, BSN    Virtual Visit No    Medication changes reported     No    Fall or balance concerns reported    No    Tobacco Cessation No Change    Warm-up and Cool-down Performed as group-led instruction    Resistance Training Performed Yes    VAD Patient? No    PAD/SET Patient? No      Pain Assessment   Currently in Pain? No/denies    Pain Score 0-No pain    Multiple Pain Sites No           Capillary Blood Glucose: No results found for this or any previous visit (from the past 24 hour(s)).    Social History   Tobacco Use  Smoking Status Former Smoker  . Years: 40.00  . Types: Cigarettes  . Quit date: 06/21/2012  . Years since quitting: 7.6  Smokeless Tobacco Never Used    Goals Met:  Independence with exercise equipment Exercise tolerated well No report of cardiac concerns or symptoms Strength training completed today  Goals Unmet:  Not Applicable  Comments: checkout time is 1030   Dr. Kathie Dike is Medical Director for [facility] Pulmonary Rehab.

## 2020-02-14 ENCOUNTER — Inpatient Hospital Stay (HOSPITAL_BASED_OUTPATIENT_CLINIC_OR_DEPARTMENT_OTHER): Payer: Medicare Other | Admitting: Hematology

## 2020-02-14 ENCOUNTER — Inpatient Hospital Stay (HOSPITAL_COMMUNITY): Payer: Medicare Other

## 2020-02-14 VITALS — BP 132/64 | HR 85 | Temp 97.2°F | Resp 20 | Wt 267.8 lb

## 2020-02-14 DIAGNOSIS — C61 Malignant neoplasm of prostate: Secondary | ICD-10-CM | POA: Diagnosis not present

## 2020-02-14 DIAGNOSIS — Z5111 Encounter for antineoplastic chemotherapy: Secondary | ICD-10-CM | POA: Diagnosis not present

## 2020-02-14 MED ORDER — DENOSUMAB 120 MG/1.7ML ~~LOC~~ SOLN
120.0000 mg | Freq: Once | SUBCUTANEOUS | Status: AC
  Administered 2020-02-14: 120 mg via SUBCUTANEOUS
  Filled 2020-02-14: qty 1.7

## 2020-02-14 MED ORDER — LEUPROLIDE ACETATE (3 MONTH) 22.5 MG ~~LOC~~ KIT
22.5000 mg | PACK | Freq: Once | SUBCUTANEOUS | Status: AC
  Administered 2020-02-14: 22.5 mg via SUBCUTANEOUS
  Filled 2020-02-14: qty 22.5

## 2020-02-14 NOTE — Progress Notes (Signed)
Hunter Jones, [name] [id number]   CLINIC:  Medical Oncology/Hematology  PCP:  Monico Blitz, MD [address] [place] [address] [phone]   REASON FOR VISIT:  Follow-up for prostate cancer  PRIOR THERAPY:  1. Radical resection of prostate on 06/27/2014. 2. Zytiga from 05/19/2018 to 11/09/2019.  NGS Results: Guardant AR amplification, TMB 16 Muts/Mb  CURRENT THERAPY: Lupron every 3 months  BRIEF ONCOLOGIC HISTORY:  Oncology History  Prostate cancer ([place])  02/06/2014 Procedure   Prostate biopsy by Dr. Clyde Lundborg   02/11/2014 Pathology Results   Prostatic adenocarcinoma identified in 5 of 6 prostate specimens with Gleason pattern showing primary pattern-grade 4, secondary pattern grade 3.  Total Gleason score equals 7.  Proportion of prostatic tissue involved by tumor: 70%.   05/03/2014 Imaging   Bone scan- Negative    06/27/2014 Procedure   Radical resection of prostate by Dr. Raynelle Bring   07/29/2014 Pathology Results   1. Prostate, radical resection - PROSTATIC ADENOCARCINOMA, GLEASON SCORE 4 + 3 = 7. - RIGHT AND LEFT PROSTATE INVOLVED. - RIGHT AND LEFT SEMINAL VESICLES INVOLVED BY TUMOR. - EXTRACAPSULAR EXTENSION BY TUMOR. - TUMOR EXTENDS INTO BLADDER NECK TISSUE. - MARGINS NOT INVOLVED. 2. Lymph nodes, regional resection, left pelvic - THREE BENIGN LYMPH NODES (0/3). 3. Lymph nodes, regional resection, right pelvic - FOUR BENIGN LYMPH NODES (0/4).   11/16/2016 Imaging   Bone scan- New areas of increased uptake superolateral to the left orbit (faint), at S1, and in the posterolateral aspect of the left sixth rib.    11/25/2016 Imaging   CT CAP- 1. Status post radical prostatectomy. No findings to suggest local recurrence of disease or definite extraskeletal metastatic disease in the chest, abdomen or pelvis. However, there are several osseous lesions, as above, concerning for metastatic disease to the bones. 2. Hepatic  steatosis. 3. Aortic atherosclerosis, in addition to 2 vessel coronary artery disease. Please note that although the presence of coronary artery calcium documents the presence of coronary artery disease, the severity of this disease and any potential stenosis cannot be assessed on this non-gated CT examination. Assessment for potential risk factor modification, dietary therapy or pharmacologic therapy may be warranted, if clinically indicated. 4. Additional incidental findings, as above.    Genetic Testing   Guardant 360 Results:           CANCER STAGING: Cancer Staging Prostate cancer [facility]) Staging form: Prostate, AJCC 7th Edition - Pathologic stage from 07/29/2014: Stage III (T3b, N0, cM0, Gleason 7) - Signed by Baird Cancer, PA-C on 11/09/2016 - Clinical: Stage IV (T3, N0, M1, PSA: Less than 10, Gleason 7) - Signed by [name], MD on 12/21/2016 - Pathologic: No stage assigned - Unsigned   INTERVAL HISTORY:  [name], a 64 y.o. male, returns for routine follow-up of his prostate cancer. Arlis was last seen on 01/16/2020.  Today he reports feeling okay. He denies having any jaw pain or any new muscles aches or pains. He is going through cardiac rehab and is tolerating it well and improving.   REVIEW OF SYSTEMS:  Review of Systems  Constitutional: Positive for appetite change (mildly decreased) and fatigue (moderate).  Gastrointestinal: Positive for diarrhea (occasional).  Musculoskeletal: Positive for back pain (5/10 back pain). Negative for myalgias.  Skin: Positive for itching and rash.  All other systems reviewed and are negative.   PAST MEDICAL/SURGICAL HISTORY:  Past Medical History:  Diagnosis Date  .  Asthma   . COPD (chronic obstructive pulmonary disease) ([place])   . Coronary artery disease    DES to mid LAD August 2014 ([facility]); RCA stents 11/2019  . Diabetes mellitus without complication ([place])   . Essential hypertension   . Hyperthyroidism    . NSTEMI (non-ST elevated myocardial infarction) ([place]) 2014  . Prostate cancer [facility])    Past Surgical History:  Procedure Laterality Date  . APPENDECTOMY    . CORONARY/GRAFT ACUTE MI REVASCULARIZATION N/A 12/10/2019   Procedure: Coronary/Graft Acute MI Revascularization;  Surgeon: Nelva Bush, MD;  Location: [facility];  Service: Cardiovascular;  Laterality: N/A;  . LEFT HEART CATH AND CORONARY ANGIOGRAPHY N/A 12/10/2019   Procedure: LEFT HEART CATH AND CORONARY ANGIOGRAPHY;  Surgeon: Nelva Bush, MD;  Location: [place] CV [facility];  Service: Cardiovascular;  Laterality: N/A;  . LYMPHADENECTOMY Bilateral 06/27/2014   Procedure: LYMPHADENECTOMY;  Surgeon: Raynelle Bring, MD;  Location: [facility];  Service: Urology;  Laterality: Bilateral;  . ROBOT ASSISTED LAPAROSCOPIC RADICAL PROSTATECTOMY N/A 06/27/2014   Procedure: ROBOTIC ASSISTED LAPAROSCOPIC RADICAL PROSTATECTOMY LEVEL 3;  Surgeon: Raynelle Bring, MD;  Location: [facility];  Service: Urology;  Laterality: N/A;  . TONSILLECTOMY      SOCIAL HISTORY:  Social History   Socioeconomic History  . Marital status: Married    Spouse name: Not on file  . Number of children: Not on file  . Years of education: Not on file  . Highest education level: Not on file  Occupational History  . Not on file  Tobacco Use  . Smoking status: Former Smoker    Years: 40.00    Types: Cigarettes    Quit date: 06/21/2012    Years since quitting: 7.6  . Smokeless tobacco: Never Used  Vaping Use  . Vaping Use: Never used  Substance and Sexual Activity  . Alcohol use: Yes    Comment: occasional  . Drug use: No  . Sexual activity: Not on file  Other Topics Concern  . Not on file  Social History Narrative  . Not on file   Social Determinants of Health   Financial Resource Strain:   . Difficulty of Paying Living Expenses: Not on file  Food Insecurity:   . Worried About Charity fundraiser in the Last Year: Not on file  . Ran Out of Food in  the Last Year: Not on file  Transportation Needs:   . Lack of Transportation (Medical): Not on file  . Lack of Transportation (Non-Medical): Not on file  Physical Activity:   . Days of Exercise per Week: Not on file  . Minutes of Exercise per Session: Not on file  Stress:   . Feeling of Stress : Not on file  Social Connections:   . Frequency of Communication with Friends and Family: Not on file  . Frequency of Social Gatherings with Friends and Family: Not on file  . Attends Religious Services: Not on file  . Active Member of Clubs or Organizations: Not on file  . Attends Archivist Meetings: Not on file  . Marital Status: Not on file  Intimate Partner Violence:   . Fear of Current or Ex-Partner: Not on file  . Emotionally Abused: Not on file  . Physically Abused: Not on file  . Sexually Abused: Not on file    FAMILY HISTORY:  Family History  Problem Relation Age of Onset  . Dementia Mother   . Thyroid disease Mother   . Clotting disorder Mother   .  Kidney Stones Father   . Stroke Brother   . Alzheimer's disease Maternal Uncle   . Tuberculosis Paternal Grandmother   . Heart attack Maternal Uncle        x4  . Heart attack Cousin        in his 11s    CURRENT MEDICATIONS:  Current Outpatient Medications  Medication Sig Dispense Refill  . albuterol (PROVENTIL HFA;VENTOLIN HFA) 108 (90 BASE) MCG/ACT inhaler Inhale 1-2 puffs into the lungs every 6 (six) hours as needed for wheezing or shortness of breath.     [name] Kitchen aspirin EC 81 MG tablet Take 81 mg by mouth daily.     [name] Kitchen atorvastatin (LIPITOR) 40 MG tablet TAKE 1 TABLET BY MOUTH AT BEDTIME. (Patient taking differently: Take 40 mg by mouth at bedtime. ) 30 tablet 6  . Calcium-Magnesium-Vitamin D (CALCIUM 500 PO) Take 2 tablets by mouth daily with breakfast. Per pt he takes 676m BID    . Co-Enzyme Q-10 100 MG CAPS Take 1 capsule by mouth daily.    .[name] Kitchendenosumab (XGEVA) 120 MG/1.7ML SOLN injection Inject 120 mg into the  skin every 30 (thirty) days.    . empagliflozin (JARDIANCE) 10 MG TABS tablet Take 1 tablet (10 mg total) by mouth daily. 30 tablet 6  . fluconazole (DIFLUCAN) 150 MG tablet Take 150 mg by mouth once a week.    . fluticasone-salmeterol (ADVAIR HFA) 115-21 MCG/ACT inhaler Inhale 2 puffs into the lungs 2 (two) times daily.     .[name] KitchenKRILL OIL PO Take 1 capsule by mouth daily.     .[name] KitchenLeuprolide Acetate, 3 Month, (LUPRON DEPOT, 72-MONTH, IM) Inject into the muscle every 3 (three) months.    .[name] Kitchenlosartan (COZAAR) 25 MG tablet Take 25 mg by mouth daily.     . methimazole (TAPAZOLE) 10 MG tablet Take 10 mg by mouth every other day.     . metoprolol succinate (TOPROL XL) 25 MG 24 hr tablet Take 1 tablet (25 mg total) by mouth daily. 90 tablet 1  . Multiple Vitamin (MULTIVITAMIN) tablet Take 1 tablet by mouth daily.    . Potassium 99 MG TABS Take 1 tablet by mouth daily.    . ticagrelor (BRILINTA) 90 MG TABS tablet Take 1 tablet (90 mg total) by mouth 2 (two) times daily. 60 tablet 11  . nitroGLYCERIN (NITROSTAT) 0.4 MG SL tablet Place 1 tablet (0.4 mg total) under the tongue every 5 (five) minutes as needed. (Patient not taking: Reported on 02/14/2020) 25 tablet 12  . triamcinolone cream (KENALOG) 0.1 % Apply 1 application topically daily as needed (For lg rash).  (Patient not taking: Reported on 02/14/2020)     No current facility-administered medications for this visit.    ALLERGIES:  Allergies  Allergen Reactions  . Xtandi [Enzalutamide] Other (See Comments)    "Severe personality change"   . Penicillins Hives    PHYSICAL EXAM:  Performance status (ECOG): 1 - Symptomatic but completely ambulatory  Vitals:   02/14/20 1344  BP: 132/64  Pulse: 85  Resp: 20  Temp: (!) 97.2 F (36.2 C)  SpO2: 98%   Wt Readings from Last 3 Encounters:  02/14/20 267 lb 12.8 oz (121.5 kg)  02/04/20 267 lb 3.2 oz (121.2 kg)  01/23/20 272 lb 4.3 oz (123.5 kg)   Physical Exam Vitals reviewed.  Constitutional:       Appearance: Normal appearance. He is obese.  Cardiovascular:     Rate and Rhythm: Normal rate and regular rhythm.  Pulses: Normal pulses.     Heart sounds: Normal heart sounds.  Pulmonary:     Effort: Pulmonary effort is normal.     Breath sounds: Normal breath sounds.  Neurological:     General: No focal deficit present.     Mental Status: He is alert and oriented to person, place, and time.  Psychiatric:        Mood and Affect: Mood normal.        Behavior: Behavior normal.      LABORATORY DATA:  I have reviewed the labs as listed.  CBC Latest Ref Rng & Units 02/13/2020 01/16/2020 12/19/2019  WBC 4.0 - 10.5 K/uL 6.4 6.1 7.0  Hemoglobin 13.0 - 17.0 g/dL 13.8 13.8 13.5  Hematocrit 39 - 52 % 42.0 41.5 40.4  Platelets 150 - 400 K/uL 137(L) 157 216   CMP Latest Ref Rng & Units 02/13/2020 01/16/2020 12/19/2019  Glucose 70 - 99 mg/dL 93 139(H) 120(H)  BUN 8 - 23 mg/dL 28(H) 22 35(H)  Creatinine 0.61 - 1.24 mg/dL 1.25(H) 1.29(H) 1.50(H)  Sodium 135 - 145 mmol/L 141 140 139  Potassium 3.5 - 5.1 mmol/L 4.8 4.4 4.8  Chloride 98 - 111 mmol/L 107 107 105  CO2 22 - 32 mmol/L _0 Calcium 8.9 - 10.3 mg/dL 9.4 9.4 9.3  Total Protein 6.5 - 8.1 g/dL 6.6 6.7 7.1  Total Bilirubin 0.3 - 1.2 mg/dL 0.5 0.5 0.6  Alkaline Phos 38 - 126 U/L 79 88 106  AST 15 - 41 U/L _1 ALT 0 - 44 U/L _2 Lab Results  Component Value Date   PSA 1.66 02/13/2020   PSA 1.42 12/19/2019   PSA 1.09 11/20/2019    DIAGNOSTIC IMAGING:  I have independently reviewed the scans and discussed with the patient. No results found.   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 -Abiraterone 750 mg and prednisone 5 mg started on 06/16/2018. -CTAP and bone scan on 05/23/2019 showed improved response compared to prior scans. -Abiraterone dose increased to 1000 mg daily in the first week of January 2021 as PSA was going up. -CT AP on  11/06/2019 showed unchanged appearance of diffuse sclerotic bone metastasis. No visceral mets. -Bone scan on 11/06/2019 shows sclerotic metastatic disease to the sacrum. Similar to slightly increased activity compared to the previous study could be due to technical factors. No new areas. -Xtandi started around 11/21/2019, discontinued on 12/05/2019 due to mood changes. -ST elevation MI involving the right coronary artery on 12/10/2019, status post cardiac catheterization showing thrombotic occlusion of the proximal RCA, successful PCI with stent placement. -He is on aspirin and Brilinta. -Invitae testing was negative. -Guardant 360 showed AR amplification.  No other mutations.   PLAN:  1. 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 -We discussed the results of Invitae testing which was negative.  Guardant 360 also did not show any targetable mutations.  It showed AR amplification. -I have talked to him about trying bicalutamide 50 mg daily.  We talked about side effects.  He wishes to wait until 2 months and start it if there is significant increase in PSA.  He will receive his Lupron injection today.  2. Bone metastasis: -Continue denosumab monthly.  Calcium is within normal limits. -Continue calcium and vitamin D supplements.  3.Elevated blood sugars: -Continue Jardiance.  4. CAD and stents: -Continue aspirin  and Brilinta.   Orders placed this encounter:  No orders of the defined types were placed in this encounter.    Derek Jack, MD [place] [phone]   I, Milinda Antis, am acting as a scribe for Dr. Sanda Linger.  I, Derek Jack MD, have reviewed the above documentation for accuracy and completeness, and I agree with the above.

## 2020-02-14 NOTE — Patient Instructions (Signed)
[place] at [facility] Discharge Instructions  You were seen today by Dr. Delton Coombes. He went over your recent results. You received your injections today. Dr. Delton Coombes will see you back in 2 months for labs and follow up.   Thank you for choosing [place] at [facility] to provide your oncology and hematology care.  To afford each patient quality time with our provider, please arrive at least 15 minutes before your scheduled appointment time.   If you have a lab appointment with the [place] please come in thru the Main Entrance and check in at the main information desk  You need to re-schedule your appointment should you arrive 10 or more minutes late.  We strive to give you quality time with our providers, and arriving late affects you and other patients whose appointments are after yours.  Also, if you no show three or more times for appointments you may be dismissed from the clinic at the providers discretion.     Again, thank you for choosing [facility].  Our hope is that these requests will decrease the amount of time that you wait before being seen by our physicians.       _____________________________________________________________  Should you have questions after your visit to [facility], please contact our office at [phone] between the hours of 8:00 a.m. and 4:30 p.m.  Voicemails left after 4:00 p.m. will not be returned until the following business day.  For prescription refill requests, have your pharmacy contact our office and allow 72 hours.    Cancer Center Support Programs:   > Cancer Support Group  2nd Tuesday of the month 1pm-2pm, Journey Room

## 2020-02-14 NOTE — Progress Notes (Signed)
Patient was assessed by Dr. Delton Coombes and labs have been reviewed.  Calcium 9.4, Patient is okay to proceed with treatment today. Primary RN and pharmacy aware.

## 2020-02-14 NOTE — Progress Notes (Signed)
Xgeva and Eligard injections given per orders. See MAR  . Patient tolerated it well without problems. Vitals stable and discharged home from clinic ambulatory. Follow up as scheduled.

## 2020-02-15 ENCOUNTER — Encounter (HOSPITAL_COMMUNITY)
Admission: RE | Admit: 2020-02-15 | Discharge: 2020-02-15 | Disposition: A | Discharge status: Home / Self Care | Payer: Medicare Other | Source: Ambulatory Visit | Attending: Cardiology | Admitting: Cardiology

## 2020-02-15 ENCOUNTER — Other Ambulatory Visit: Payer: Self-pay

## 2020-02-15 DIAGNOSIS — I213 ST elevation (STEMI) myocardial infarction of unspecified site: Secondary | ICD-10-CM

## 2020-02-15 DIAGNOSIS — Z955 Presence of coronary angioplasty implant and graft: Secondary | ICD-10-CM

## 2020-02-15 NOTE — Progress Notes (Signed)
Daily Session Note  Patient Details  Name: BRAXDON GAPPA MRN: [id number] Date of Birth: 1955-07-08 Referring Provider:     [place] from 01/23/2020 in [place]  Referring Provider Domenic Polite      Encounter Date: 02/15/2020  Check In:  Session Check In - 02/15/20 0958      Check-In   Supervising physician immediately available to respond to emergencies See telemetry face sheet for immediately available MD    Location [facility]    Staff Present Hoy Register, MS, ACSM-CEP, Exercise Physiologist;Debra Wynetta Emery, RN, BSN    Virtual Visit No    Medication changes reported     No    Fall or balance concerns reported    No    Tobacco Cessation No Change    Warm-up and Cool-down Performed as group-led instruction    Resistance Training Performed Yes    VAD Patient? No      Pain Assessment   Currently in Pain? No/denies    Pain Score 0-No pain    Multiple Pain Sites No           Capillary Blood Glucose: No results found for this or any previous visit (from the past 24 hour(s)).    Social History   Tobacco Use  Smoking Status Former Smoker  . Years: 40.00  . Types: Cigarettes  . Quit date: 06/21/2012  . Years since quitting: 7.6  Smokeless Tobacco Never Used    Goals Met:  Independence with exercise equipment Exercise tolerated well No report of cardiac concerns or symptoms Strength training completed today  Goals Unmet:  Not Applicable  Comments: checkout time is 1030   Dr. Kathie Dike is Medical Director for [facility] Pulmonary Rehab.

## 2020-02-18 ENCOUNTER — Encounter (HOSPITAL_COMMUNITY): Payer: Medicare Other

## 2020-02-20 ENCOUNTER — Other Ambulatory Visit: Payer: Self-pay

## 2020-02-20 ENCOUNTER — Encounter (HOSPITAL_COMMUNITY)
Admission: RE | Admit: 2020-02-20 | Discharge: 2020-02-20 | Disposition: A | Discharge status: Home / Self Care | Payer: Medicare Other | Source: Ambulatory Visit | Attending: Cardiology | Admitting: Cardiology

## 2020-02-20 VITALS — Wt 265.7 lb

## 2020-02-20 DIAGNOSIS — I213 ST elevation (STEMI) myocardial infarction of unspecified site: Secondary | ICD-10-CM | POA: Diagnosis not present

## 2020-02-20 DIAGNOSIS — Z955 Presence of coronary angioplasty implant and graft: Secondary | ICD-10-CM

## 2020-02-20 NOTE — Progress Notes (Signed)
Daily Session Note  Patient Details  Name: Hunter Jones MRN: [id number] Date of Birth: [birth year] Referring Provider:     [place] from 01/23/2020 in [place]  Referring Provider Domenic Polite      Encounter Date: 02/20/2020  Check In:  Session Check In - 02/20/20 5883      Check-In   Supervising physician immediately available to respond to emergencies See telemetry face sheet for immediately available MD    Location [facility]    Staff Present Hoy Register, MS, ACSM-CEP, Exercise Physiologist;Carlette Wilber Oliphant, RN, BSN    Virtual Visit No    Medication changes reported     No    Fall or balance concerns reported    No    Tobacco Cessation No Change    Warm-up and Cool-down Performed as group-led instruction    Resistance Training Performed Yes    VAD Patient? No    PAD/SET Patient? No      Pain Assessment   Currently in Pain? No/denies           Capillary Blood Glucose: No results found for this or any previous visit (from the past 24 hour(s)).   Exercise Prescription Changes - 02/20/20 1200      Response to Exercise   Blood Pressure (Admit) 118/76    Blood Pressure (Exercise) 138/60    Blood Pressure (Exit) 112/64    Heart Rate (Admit) 87 bpm    Heart Rate (Exercise) 121 bpm    Heart Rate (Exit) 96 bpm    Rating of Perceived Exertion (Exercise) 12    Duration Continue with 30 min of aerobic exercise without signs/symptoms of physical distress.    Intensity THRR unchanged      Progression   Progression Continue to progress workloads to maintain intensity without signs/symptoms of physical distress.      Resistance Training   Training Prescription Yes    Weight 3 lbs    Reps 10-15      NuStep   Level 4    SPM 129    Minutes 17    METs 4.6      Recumbant Elliptical   Level 1    RPM 73    Watts 105    Minutes 22    METs 5.5           Social History   Tobacco Use  Smoking  Status Former Smoker  . Years: 40.00  . Types: Cigarettes  . Quit date: 06/21/2012  . Years since quitting: 7.6  Smokeless Tobacco Never Used    Goals Met:  Independence with exercise equipment Exercise tolerated well No report of cardiac concerns or symptoms Strength training completed today  Goals Unmet:  Not Applicable  Comments: checkout time is 1030   Dr. Kathie Dike is Medical Director for [facility] Pulmonary Rehab.

## 2020-02-22 ENCOUNTER — Other Ambulatory Visit: Payer: Self-pay

## 2020-02-22 ENCOUNTER — Encounter (HOSPITAL_COMMUNITY)
Admission: RE | Admit: 2020-02-22 | Discharge: 2020-02-22 | Disposition: A | Discharge status: Home / Self Care | Payer: Medicare Other | Source: Ambulatory Visit | Attending: Cardiology | Admitting: Cardiology

## 2020-02-22 DIAGNOSIS — Z955 Presence of coronary angioplasty implant and graft: Secondary | ICD-10-CM

## 2020-02-22 DIAGNOSIS — I213 ST elevation (STEMI) myocardial infarction of unspecified site: Secondary | ICD-10-CM

## 2020-02-22 NOTE — Progress Notes (Signed)
Daily Session Note  Patient Details  Name: Hunter Jones MRN: [id number] Date of Birth: Nov 28, 1955 Referring Provider:     [place] from 01/23/2020 in [place]  Referring Provider Domenic Polite      Encounter Date: 02/22/2020  Check In:  Session Check In - 02/22/20 0935      Check-In   Supervising physician immediately available to respond to emergencies See telemetry face sheet for immediately available MD    Location [facility]    Staff Present Hoy Register, MS, ACSM-CEP, Exercise Physiologist;Carlette Wilber Oliphant, RN, BSN    Virtual Visit No    Medication changes reported     No    Fall or balance concerns reported    No    Tobacco Cessation No Change    Warm-up and Cool-down Performed as group-led instruction    Resistance Training Performed Yes    VAD Patient? No    PAD/SET Patient? No      Pain Assessment   Currently in Pain? No/denies    Pain Score 0-No pain           Capillary Blood Glucose: No results found for this or any previous visit (from the past 24 hour(s)).    Social History   Tobacco Use  Smoking Status Former Smoker  . Years: 40.00  . Types: Cigarettes  . Quit date: 06/21/2012  . Years since quitting: 7.6  Smokeless Tobacco Never Used    Goals Met:  Independence with exercise equipment Exercise tolerated well No report of cardiac concerns or symptoms Strength training completed today  Goals Unmet:  Not Applicable  Comments: checkout time is 1030   Dr. Kathie Dike is Medical Director for [facility].

## 2020-02-25 ENCOUNTER — Encounter (HOSPITAL_COMMUNITY)
Admission: RE | Admit: 2020-02-25 | Discharge: 2020-02-25 | Disposition: A | Discharge status: Home / Self Care | Payer: Medicare Other | Source: Ambulatory Visit | Attending: Cardiology | Admitting: Cardiology

## 2020-02-25 ENCOUNTER — Other Ambulatory Visit: Payer: Self-pay

## 2020-02-25 DIAGNOSIS — Z955 Presence of coronary angioplasty implant and graft: Secondary | ICD-10-CM

## 2020-02-25 DIAGNOSIS — I213 ST elevation (STEMI) myocardial infarction of unspecified site: Secondary | ICD-10-CM

## 2020-02-25 NOTE — Progress Notes (Signed)
Daily Session Note  Patient Details  Name: Hunter Jones MRN: [id number] Date of Birth: 11-Oct-1955 Referring Provider:     [place] from 01/23/2020 in [place]  Referring Provider Domenic Polite      Encounter Date: 02/25/2020  Check In:  Session Check In - 02/25/20 0940      Check-In   Supervising physician immediately available to respond to emergencies See telemetry face sheet for immediately available MD    Location [facility]    Staff Present Aundra Dubin, RN, Bjorn Loser, MS, ACSM-CEP, Exercise Physiologist    Virtual Visit No    Medication changes reported     No    Fall or balance concerns reported    No    Tobacco Cessation No Change    Warm-up and Cool-down Performed as group-led instruction    Resistance Training Performed Yes    VAD Patient? No    PAD/SET Patient? No      Pain Assessment   Currently in Pain? No/denies    Pain Score 0-No pain    Multiple Pain Sites No           Capillary Blood Glucose: No results found for this or any previous visit (from the past 24 hour(s)).    Social History   Tobacco Use  Smoking Status Former Smoker  . Years: 40.00  . Types: Cigarettes  . Quit date: 06/21/2012  . Years since quitting: 7.6  Smokeless Tobacco Never Used    Goals Met:  Independence with exercise equipment Exercise tolerated well No report of cardiac concerns or symptoms Strength training completed today  Goals Unmet:  Not Applicable  Comments: checkout time is 1030   Dr. Kathie Dike is Medical Director for [facility].

## 2020-02-27 ENCOUNTER — Encounter (HOSPITAL_COMMUNITY)
Admission: RE | Admit: 2020-02-27 | Discharge: 2020-02-27 | Disposition: A | Discharge status: Home / Self Care | Payer: Medicare Other | Source: Ambulatory Visit | Attending: Cardiology | Admitting: Cardiology

## 2020-02-27 ENCOUNTER — Other Ambulatory Visit: Payer: Self-pay

## 2020-02-27 DIAGNOSIS — I213 ST elevation (STEMI) myocardial infarction of unspecified site: Secondary | ICD-10-CM

## 2020-02-27 DIAGNOSIS — Z955 Presence of coronary angioplasty implant and graft: Secondary | ICD-10-CM

## 2020-02-27 NOTE — Progress Notes (Signed)
Daily Session Note  Patient Details  Name: CHALMERS IDDINGS MRN: [id number] Date of Birth: 09-02-1955 Referring Provider:     [place] from 01/23/2020 in [place]  Referring Provider Domenic Polite      Encounter Date: 02/27/2020  Check In:  Session Check In - 02/27/20 0946      Check-In   Supervising physician immediately available to respond to emergencies See telemetry face sheet for immediately available MD    Location [facility]    Staff Present Aundra Dubin, RN, Bjorn Loser, MS, ACSM-CEP, Exercise Physiologist    Virtual Visit No    Medication changes reported     No    Fall or balance concerns reported    No    Tobacco Cessation No Change    Warm-up and Cool-down Performed as group-led instruction    Resistance Training Performed Yes    VAD Patient? No    PAD/SET Patient? No      Pain Assessment   Currently in Pain? No/denies    Pain Score 0-No pain    Multiple Pain Sites No           Capillary Blood Glucose: No results found for this or any previous visit (from the past 24 hour(s)).    Social History   Tobacco Use  Smoking Status Former Smoker  . Years: 40.00  . Types: Cigarettes  . Quit date: 06/21/2012  . Years since quitting: 7.6  Smokeless Tobacco Never Used    Goals Met:  Independence with exercise equipment Exercise tolerated well No report of cardiac concerns or symptoms Strength training completed today  Goals Unmet:  Not Applicable  Comments: checkout time is 1030   Dr. Kathie Dike is Medical Director for [facility].

## 2020-02-29 ENCOUNTER — Encounter (HOSPITAL_COMMUNITY)
Admission: RE | Admit: 2020-02-29 | Discharge: 2020-02-29 | Disposition: A | Discharge status: Home / Self Care | Payer: Medicare Other | Source: Ambulatory Visit | Attending: Cardiology | Admitting: Cardiology

## 2020-02-29 ENCOUNTER — Other Ambulatory Visit: Payer: Self-pay

## 2020-02-29 DIAGNOSIS — I213 ST elevation (STEMI) myocardial infarction of unspecified site: Secondary | ICD-10-CM | POA: Diagnosis not present

## 2020-02-29 DIAGNOSIS — Z955 Presence of coronary angioplasty implant and graft: Secondary | ICD-10-CM

## 2020-02-29 NOTE — Progress Notes (Signed)
Daily Session Note  Patient Details  Name: Hunter Jones MRN: [id number] Date of Birth: [birth year] Referring Provider:     CARDIAC REHAB PHASE II ORIENTATION from 01/23/2020 in [place]  Referring Provider Domenic Polite      Encounter Date: 02/29/2020  Check In:  Session Check In - 02/29/20 1224      Check-In   Supervising physician immediately available to respond to emergencies See telemetry face sheet for immediately available MD    Location [facility]    Staff Present Ramon Dredge, RN, Madelaine Bhat, RN, BSN    Virtual Visit No    Medication changes reported     No    Fall or balance concerns reported    No    Tobacco Cessation No Change    Warm-up and Cool-down Performed as group-led instruction    Resistance Training Performed Yes    VAD Patient? No    PAD/SET Patient? No      Pain Assessment   Currently in Pain? No/denies    Pain Score 0-No pain    Multiple Pain Sites No           Capillary Blood Glucose: No results found for this or any previous visit (from the past 24 hour(s)).    Social History   Tobacco Use  Smoking Status Former Smoker   Years: 40.00   Types: Cigarettes   Quit date: 06/21/2012   Years since quitting: 7.6  Smokeless Tobacco Never Used    Goals Met:  Independence with exercise equipment Exercise tolerated well No report of cardiac concerns or symptoms Strength training completed today  Goals Unmet:  Not Applicable  Comments: [phone]   Dr. Kathie Dike is Medical Director for [facility].

## 2020-03-03 ENCOUNTER — Encounter (HOSPITAL_COMMUNITY)
Admission: RE | Admit: 2020-03-03 | Discharge: 2020-03-03 | Disposition: A | Discharge status: Home / Self Care | Payer: Medicare Other | Source: Ambulatory Visit | Attending: Cardiology | Admitting: Cardiology

## 2020-03-03 ENCOUNTER — Other Ambulatory Visit: Payer: Self-pay

## 2020-03-03 ENCOUNTER — Encounter (HOSPITAL_COMMUNITY): Payer: Medicare Other

## 2020-03-03 DIAGNOSIS — Z955 Presence of coronary angioplasty implant and graft: Secondary | ICD-10-CM

## 2020-03-03 DIAGNOSIS — I213 ST elevation (STEMI) myocardial infarction of unspecified site: Secondary | ICD-10-CM | POA: Diagnosis not present

## 2020-03-03 NOTE — Progress Notes (Signed)
Daily Session Note  Patient Details  Name: Hunter Jones MRN: [id number] Date of Birth: Oct 26, 1955 Referring Provider:     [place] from 01/23/2020 in [place]  Referring Provider Domenic Polite      Encounter Date: 03/03/2020  Check In:  Session Check In - 03/03/20 0939      Check-In   Supervising physician immediately available to respond to emergencies See telemetry face sheet for immediately available MD    Location [facility]    Staff Present Aundra Dubin, RN, Bjorn Loser, MS, ACSM-CEP, Exercise Physiologist    Virtual Visit No    Medication changes reported     No    Fall or balance concerns reported    No    Tobacco Cessation No Change    Warm-up and Cool-down Performed as group-led instruction    Resistance Training Performed Yes    VAD Patient? No    PAD/SET Patient? No      Pain Assessment   Currently in Pain? No/denies    Pain Score 0-No pain    Multiple Pain Sites No           Capillary Blood Glucose: No results found for this or any previous visit (from the past 24 hour(s)).    Social History   Tobacco Use  Smoking Status Former Smoker  . Years: 40.00  . Types: Cigarettes  . Quit date: 06/21/2012  . Years since quitting: 7.7  Smokeless Tobacco Never Used    Goals Met:  Independence with exercise equipment Exercise tolerated well No report of cardiac concerns or symptoms Strength training completed today  Goals Unmet:  Not Applicable  Comments: checkout time is 1030   Dr. Kathie Dike is Medical Director for [facility].

## 2020-03-05 ENCOUNTER — Encounter (HOSPITAL_COMMUNITY)
Admission: RE | Admit: 2020-03-05 | Discharge: 2020-03-05 | Disposition: A | Discharge status: Home / Self Care | Payer: Medicare Other | Source: Ambulatory Visit | Attending: Cardiology | Admitting: Cardiology

## 2020-03-05 ENCOUNTER — Other Ambulatory Visit: Payer: Self-pay

## 2020-03-05 VITALS — Wt 266.1 lb

## 2020-03-05 DIAGNOSIS — Z955 Presence of coronary angioplasty implant and graft: Secondary | ICD-10-CM

## 2020-03-05 DIAGNOSIS — I213 ST elevation (STEMI) myocardial infarction of unspecified site: Secondary | ICD-10-CM | POA: Diagnosis not present

## 2020-03-05 NOTE — Progress Notes (Signed)
Daily Session Note  Patient Details  Name: Hunter Jones MRN: [id number] Date of Birth: 12/17/1955 Referring Provider:     CARDIAC REHAB PHASE II ORIENTATION from 01/23/2020 in [place] [facility]  Referring Provider [place]      Encounter Date: 03/05/2020  Check In:  Session Check In - 03/05/20 1000      Check-In   Supervising physician immediately available to respond to emergencies See telemetry face sheet for immediately available MD    Location [facility]    Staff Present Debra Johnson, RN, BSN;Dalton Fletcher, MS, ACSM-CEP, Exercise Physiologist    Virtual Visit No    Medication changes reported     No    Fall or balance concerns reported    No    Tobacco Cessation No Change    Warm-up and Cool-down Performed as group-led instruction    Resistance Training Performed Yes    VAD Patient? No    PAD/SET Patient? No      Pain Assessment   Currently in Pain? No/denies    Pain Score 0-No pain    Multiple Pain Sites No           Capillary Blood Glucose: No results found for this or any previous visit (from the past 24 hour(s)).    Social History   Tobacco Use  Smoking Status Former Smoker  . Years: 40.00  . Types: Cigarettes  . Quit date: 06/21/2012  . Years since quitting: 7.7  Smokeless Tobacco Never Used    Goals Met:  Independence with exercise equipment Exercise tolerated well No report of cardiac concerns or symptoms Strength training completed today  Goals Unmet:  Not Applicable  Comments: checkout time is 1030   Dr. Jehanzeb Memon is Medical Director for [place] [facility]. 

## 2020-03-07 ENCOUNTER — Encounter (HOSPITAL_COMMUNITY): Payer: Medicare Other

## 2020-03-10 ENCOUNTER — Encounter (HOSPITAL_COMMUNITY)
Admission: RE | Admit: 2020-03-10 | Discharge: 2020-03-10 | Disposition: A | Discharge status: Home / Self Care | Payer: Medicare Other | Source: Ambulatory Visit | Attending: Cardiology | Admitting: Cardiology

## 2020-03-10 ENCOUNTER — Other Ambulatory Visit: Payer: Self-pay

## 2020-03-10 DIAGNOSIS — I213 ST elevation (STEMI) myocardial infarction of unspecified site: Secondary | ICD-10-CM | POA: Diagnosis not present

## 2020-03-10 DIAGNOSIS — Z955 Presence of coronary angioplasty implant and graft: Secondary | ICD-10-CM

## 2020-03-10 NOTE — Progress Notes (Signed)
Daily Session Note  Patient Details  Name: Hunter Jones MRN: [id number] Date of Birth: Jun 20, 1955 Referring Provider:     [place] from 01/23/2020 in [place]  Referring Provider Domenic Polite      Encounter Date: 03/10/2020  Check In:  Session Check In - 03/10/20 1749      Check-In   Supervising physician immediately available to respond to emergencies See telemetry face sheet for immediately available MD    Location [facility]    Staff Present Aundra Dubin, RN, Bjorn Loser, MS, ACSM-CEP, Exercise Physiologist    Virtual Visit No    Medication changes reported     No    Fall or balance concerns reported    No    Tobacco Cessation No Change    Warm-up and Cool-down Performed as group-led instruction    Resistance Training Performed Yes    VAD Patient? No    PAD/SET Patient? No      Pain Assessment   Currently in Pain? No/denies    Pain Score 0-No pain    Multiple Pain Sites No           Capillary Blood Glucose: No results found for this or any previous visit (from the past 24 hour(s)).    Social History   Tobacco Use  Smoking Status Former Smoker  . Years: 40.00  . Types: Cigarettes  . Quit date: 06/21/2012  . Years since quitting: 7.7  Smokeless Tobacco Never Used    Goals Met:  Independence with exercise equipment Exercise tolerated well No report of cardiac concerns or symptoms Strength training completed today  Goals Unmet:  Not Applicable  Comments: checkout time is 1030   Dr. Kathie Dike is Medical Director for [facility].

## 2020-03-12 ENCOUNTER — Other Ambulatory Visit: Payer: Self-pay

## 2020-03-12 ENCOUNTER — Encounter (HOSPITAL_COMMUNITY)
Admission: RE | Admit: 2020-03-12 | Discharge: 2020-03-12 | Disposition: A | Discharge status: Home / Self Care | Payer: Medicare Other | Source: Ambulatory Visit | Attending: Cardiology | Admitting: Cardiology

## 2020-03-12 DIAGNOSIS — I213 ST elevation (STEMI) myocardial infarction of unspecified site: Secondary | ICD-10-CM | POA: Diagnosis not present

## 2020-03-12 DIAGNOSIS — Z955 Presence of coronary angioplasty implant and graft: Secondary | ICD-10-CM

## 2020-03-12 NOTE — Progress Notes (Signed)
Cardiac Individual Treatment Plan  Patient Details  Name: Hunter Jones MRN: [id number] Date of Birth: 1955/10/26 Referring Provider:     [place] from 01/23/2020 in [facility]  Referring Provider Domenic Polite      Initial Encounter Date:    CARDIAC REHAB PHASE II ORIENTATION from 01/23/2020 in [facility]  Date 01/23/20      Visit Diagnosis: ST elevation myocardial infarction (STEMI), unspecified artery ([place])  S/P coronary artery stent placement  Patient's Home Medications on Admission:  Current Outpatient Medications:  .  albuterol (PROVENTIL HFA;VENTOLIN HFA) 108 (90 BASE) MCG/ACT inhaler, Inhale 1-2 puffs into the lungs every 6 (six) hours as needed for wheezing or shortness of breath. , Disp: , Rfl:  .  aspirin EC 81 MG tablet, Take 81 mg by mouth daily. , Disp: , Rfl:  .  atorvastatin (LIPITOR) 40 MG tablet, TAKE 1 TABLET BY MOUTH AT BEDTIME. (Patient taking differently: Take 40 mg by mouth at bedtime. ), Disp: 30 tablet, Rfl: 6 .  Calcium-Magnesium-Vitamin D (CALCIUM 500 PO), Take 2 tablets by mouth daily with breakfast. Per pt he takes 600mg  BID, Disp: , Rfl:  .  Co-Enzyme Q-10 100 MG CAPS, Take 1 capsule by mouth daily., Disp: , Rfl:  .  denosumab (XGEVA) 120 MG/1.7ML SOLN injection, Inject 120 mg into the skin every 30 (thirty) days., Disp: , Rfl:  .  empagliflozin (JARDIANCE) 10 MG TABS tablet, Take 1 tablet (10 mg total) by mouth daily., Disp: 30 tablet, Rfl: 6 .  fluconazole (DIFLUCAN) 150 MG tablet, Take 150 mg by mouth once a week., Disp: , Rfl:  .  fluticasone-salmeterol (ADVAIR HFA) 115-21 MCG/ACT inhaler, Inhale 2 puffs into the lungs 2 (two) times daily. , Disp: , Rfl:  .  KRILL OIL PO, Take 1 capsule by mouth daily. , Disp: , Rfl:  .  Leuprolide Acetate, 3 Month, (LUPRON DEPOT, 81-MONTH, IM), Inject into the muscle every 3 (three) months., Disp: , Rfl:  .  losartan (COZAAR) 25 MG tablet, Take 25 mg  by mouth daily. , Disp: , Rfl:  .  methimazole (TAPAZOLE) 10 MG tablet, Take 10 mg by mouth every other day. , Disp: , Rfl:  .  metoprolol succinate (TOPROL XL) 25 MG 24 hr tablet, Take 1 tablet (25 mg total) by mouth daily., Disp: 90 tablet, Rfl: 1 .  Multiple Vitamin (MULTIVITAMIN) tablet, Take 1 tablet by mouth daily., Disp: , Rfl:  .  nitroGLYCERIN (NITROSTAT) 0.4 MG SL tablet, Place 1 tablet (0.4 mg total) under the tongue every 5 (five) minutes as needed. (Patient not taking: Reported on 02/14/2020), Disp: 25 tablet, Rfl: 12 .  Potassium 99 MG TABS, Take 1 tablet by mouth daily., Disp: , Rfl:  .  ticagrelor (BRILINTA) 90 MG TABS tablet, Take 1 tablet (90 mg total) by mouth 2 (two) times daily., Disp: 60 tablet, Rfl: 11 .  triamcinolone cream (KENALOG) 0.1 %, Apply 1 application topically daily as needed (For lg rash).  (Patient not taking: Reported on 02/14/2020), Disp: , Rfl:   Past Medical History: Past Medical History:  Diagnosis Date  . Asthma   . COPD (chronic obstructive pulmonary disease) ([place])   . Coronary artery disease    DES to mid LAD August 2014 ([facility]); RCA stents 11/2019  . Diabetes mellitus without complication ([place])   . Essential hypertension   . Hyperthyroidism   . NSTEMI (non-ST elevated myocardial infarction) ([place]) 2014  . Prostate cancer (  [facility])     Tobacco Use: Social History   Tobacco Use  Smoking Status Former Smoker  . Years: 40.00  . Types: Cigarettes  . Quit date: 06/21/2012  . Years since quitting: 7.7  Smokeless Tobacco Never Used    Labs: Recent Review Scientist, physiological    Labs for ITP Cardiac and Pulmonary Rehab Latest Ref Rng & Units 11/20/2019 12/10/2019   Cholestrol 0 - 200 mg/dL - 68   LDLCALC 0 - 99 mg/dL - 22   HDL >40 mg/dL - 28(L)   Trlycerides <150 mg/dL - 91   Hemoglobin A1c 4.8 - 5.6 % 6.7(H) -      Capillary Blood Glucose: Lab Results  Component Value Date   GLUCAP 101 (H) 01/23/2020   GLUCAP 202 (H) 12/12/2019   GLUCAP 110 (H)  12/11/2019   GLUCAP 102 (H) 12/11/2019   GLUCAP 119 (H) 12/11/2019     Exercise Target Goals: Exercise Program Goal: Individual exercise prescription set using results from initial 6 min walk test and THRR while considering  patient's activity barriers and safety.   Exercise Prescription Goal: Starting with aerobic activity 30 plus minutes a day, 3 days per week for initial exercise prescription. Provide home exercise prescription and guidelines that participant acknowledges understanding prior to discharge.  Activity Barriers & Risk Stratification:  Activity Barriers & Cardiac Risk Stratification - 01/23/20 1444      Activity Barriers & Cardiac Risk Stratification   Activity Barriers Back Problems;Deconditioning;Other (comment)    Comments Bilateral hip pain when walking.    Cardiac Risk Stratification High           6 Minute Walk:  6 Minute Walk    Row Name 01/23/20 1441         6 Minute Walk   Phase Initial     Distance 1300 feet     Walk Time 6 minutes     # of Rest Breaks 0     MPH 2.46     METS 2.4     RPE 11     Perceived Dyspnea  11     VO2 Peak 8.41     Symptoms Yes (comment)     Comments Bilateral hip pain 3/10. He was a little SOB towards the end of walk test.     Resting HR 78 bpm     Resting BP 100/60     Resting Oxygen Saturation  98 %     Exercise Oxygen Saturation  during 6 min walk 96 %     Max Ex. HR 100 bpm     Max Ex. BP 110/64     2 Minute Post BP 102/58            Oxygen Initial Assessment:   Oxygen Re-Evaluation:   Oxygen Discharge (Final Oxygen Re-Evaluation):   Initial Exercise Prescription:  Initial Exercise Prescription - 01/23/20 1400      Date of Initial Exercise RX and Referring Provider   Date 01/23/20    Referring Provider Domenic Polite    Expected Discharge Date 04/24/20      NuStep   Level 1    SPM 80    Minutes 22      Recumbant Elliptical   Level 1    RPM 60    Minutes 17      Prescription Details    Frequency (times per week) 3    Duration Progress to 30 minutes of continuous aerobic without signs/symptoms of physical distress  Intensity   THRR 40-80% of Max Heartrate 62/124    Ratings of Perceived Exertion 11-13    Perceived Dyspnea 0-4      Progression   Progression Continue progressive overload as per policy without signs/symptoms or physical distress.      Resistance Training   Training Prescription Yes    Weight 2    Reps 10-15           Perform Capillary Blood Glucose checks as needed.  Exercise Prescription Changes:  Exercise Prescription Changes    Row Name 02/04/20 1200 02/13/20 1000 02/20/20 1200 03/05/20 1000       Response to Exercise   Blood Pressure (Admit) 122/70 -- 118/76 124/70    Blood Pressure (Exercise) 134/76 -- 138/60 152/60    Blood Pressure (Exit) 110/80 -- 112/64 112/68    Heart Rate (Admit) 91 bpm -- 87 bpm 77 bpm    Heart Rate (Exercise) 105 bpm -- 121 bpm 113 bpm    Heart Rate (Exit) 99 bpm -- 96 bpm 85 bpm    Rating of Perceived Exertion (Exercise) 13 -- 12 12    Duration Continue with 30 min of aerobic exercise without signs/symptoms of physical distress. -- Continue with 30 min of aerobic exercise without signs/symptoms of physical distress. Continue with 30 min of aerobic exercise without signs/symptoms of physical distress.    Intensity THRR unchanged -- THRR unchanged THRR unchanged      Progression   Progression Continue to progress workloads to maintain intensity without signs/symptoms of physical distress. -- Continue to progress workloads to maintain intensity without signs/symptoms of physical distress. Continue to progress workloads to maintain intensity without signs/symptoms of physical distress.      Resistance Training   Training Prescription Yes -- Yes Yes    Weight 2 -- 3 lbs 4 lbs    Reps 10-15 -- 10-15 10-15      NuStep   Level 2 -- 4 4    SPM 124 -- 129 135    Minutes 17 -- 17 17    METs 4.2 -- 4.6 4.4       Recumbant Elliptical   Level 1 -- 1 3    RPM 68 -- 73 77    Watts 94 -- 105 115    Minutes 22 -- 22 22    METs 5.2 -- 5.5 6.6      Home Exercise Plan   Plans to continue exercise at -- Home (comment) -- --    Frequency -- Add 2 additional days to program exercise sessions. -- --    Initial Home Exercises Provided -- 02/13/20 -- --           Exercise Comments:  Exercise Comments    Row Name 02/13/20 1007           Exercise Comments home exercise reviewed              Exercise Goals and Review:  Exercise Goals    Row Name 01/23/20 1451 02/11/20 1031 03/10/20 1252         Exercise Goals   Increase Physical Activity Yes Yes Yes     Intervention Provide advice, education, support and counseling about physical activity/exercise needs.;Develop an individualized exercise prescription for aerobic and resistive training based on initial evaluation findings, risk stratification, comorbidities and participant's personal goals. Provide advice, education, support and counseling about physical activity/exercise needs.;Develop an individualized exercise prescription for aerobic and resistive training based on initial evaluation findings, risk  stratification, comorbidities and participant's personal goals. Provide advice, education, support and counseling about physical activity/exercise needs.;Develop an individualized exercise prescription for aerobic and resistive training based on initial evaluation findings, risk stratification, comorbidities and participant's personal goals.     Expected Outcomes Short Term: Attend rehab on a regular basis to increase amount of physical activity.;Long Term: Add in home exercise to make exercise part of routine and to increase amount of physical activity.;Long Term: Exercising regularly at least 3-5 days a week. Short Term: Attend rehab on a regular basis to increase amount of physical activity.;Long Term: Add in home exercise to make exercise part of  routine and to increase amount of physical activity.;Long Term: Exercising regularly at least 3-5 days a week. Short Term: Attend rehab on a regular basis to increase amount of physical activity.;Long Term: Add in home exercise to make exercise part of routine and to increase amount of physical activity.;Long Term: Exercising regularly at least 3-5 days a week.     Increase Strength and Stamina Yes Yes Yes     Intervention -- Provide advice, education, support and counseling about physical activity/exercise needs.;Develop an individualized exercise prescription for aerobic and resistive training based on initial evaluation findings, risk stratification, comorbidities and participant's personal goals. Provide advice, education, support and counseling about physical activity/exercise needs.;Develop an individualized exercise prescription for aerobic and resistive training based on initial evaluation findings, risk stratification, comorbidities and participant's personal goals.     Expected Outcomes Short Term: Increase workloads from initial exercise prescription for resistance, speed, and METs.;Short Term: Perform resistance training exercises routinely during rehab and add in resistance training at home;Long Term: Improve cardiorespiratory fitness, muscular endurance and strength as measured by increased METs and functional capacity (6MWT) Short Term: Increase workloads from initial exercise prescription for resistance, speed, and METs.;Short Term: Perform resistance training exercises routinely during rehab and add in resistance training at home;Long Term: Improve cardiorespiratory fitness, muscular endurance and strength as measured by increased METs and functional capacity (6MWT) Short Term: Increase workloads from initial exercise prescription for resistance, speed, and METs.;Short Term: Perform resistance training exercises routinely during rehab and add in resistance training at home;Long Term: Improve  cardiorespiratory fitness, muscular endurance and strength as measured by increased METs and functional capacity (6MWT)     Able to understand and use rate of perceived exertion (RPE) scale Yes Yes Yes     Intervention Provide education and explanation on how to use RPE scale Provide education and explanation on how to use RPE scale Provide education and explanation on how to use RPE scale     Expected Outcomes Short Term: Able to use RPE daily in rehab to express subjective intensity level;Long Term:  Able to use RPE to guide intensity level when exercising independently Short Term: Able to use RPE daily in rehab to express subjective intensity level;Long Term:  Able to use RPE to guide intensity level when exercising independently Short Term: Able to use RPE daily in rehab to express subjective intensity level;Long Term:  Able to use RPE to guide intensity level when exercising independently     Knowledge and understanding of Target Heart Rate Range (THRR) Yes Yes Yes     Intervention Provide education and explanation of THRR including how the numbers were predicted and where they are located for reference Provide education and explanation of THRR including how the numbers were predicted and where they are located for reference Provide education and explanation of THRR including how the numbers were predicted and  where they are located for reference     Expected Outcomes Short Term: Able to state/look up THRR;Long Term: Able to use THRR to govern intensity when exercising independently;Short Term: Able to use daily as guideline for intensity in rehab Short Term: Able to state/look up THRR;Long Term: Able to use THRR to govern intensity when exercising independently;Short Term: Able to use daily as guideline for intensity in rehab Short Term: Able to state/look up THRR;Long Term: Able to use THRR to govern intensity when exercising independently;Short Term: Able to use daily as guideline for intensity in  rehab     Able to check pulse independently Yes Yes Yes     Intervention Provide education and demonstration on how to check pulse in carotid and radial arteries.;Review the importance of being able to check your own pulse for safety during independent exercise Provide education and demonstration on how to check pulse in carotid and radial arteries.;Review the importance of being able to check your own pulse for safety during independent exercise Provide education and demonstration on how to check pulse in carotid and radial arteries.;Review the importance of being able to check your own pulse for safety during independent exercise     Expected Outcomes Short Term: Able to explain why pulse checking is important during independent exercise;Long Term: Able to check pulse independently and accurately Short Term: Able to explain why pulse checking is important during independent exercise;Long Term: Able to check pulse independently and accurately Short Term: Able to explain why pulse checking is important during independent exercise;Long Term: Able to check pulse independently and accurately     Understanding of Exercise Prescription Yes Yes Yes     Intervention Provide education, explanation, and written materials on patient's individual exercise prescription Provide education, explanation, and written materials on patient's individual exercise prescription Provide education, explanation, and written materials on patient's individual exercise prescription     Expected Outcomes Short Term: Able to explain program exercise prescription;Long Term: Able to explain home exercise prescription to exercise independently Short Term: Able to explain program exercise prescription;Long Term: Able to explain home exercise prescription to exercise independently Short Term: Able to explain program exercise prescription;Long Term: Able to explain home exercise prescription to exercise independently            Exercise  Goals Re-Evaluation :  Exercise Goals Re-Evaluation    [place] Name 02/11/20 1031 03/10/20 1252           Exercise Goal Re-Evaluation   Exercise Goals Review Increase Physical Activity;Increase Strength and Stamina;Able to understand and use rate of perceived exertion (RPE) scale;Knowledge and understanding of Target Heart Rate Range (THRR);Able to check pulse independently;Understanding of Exercise Prescription Increase Physical Activity;Increase Strength and Stamina;Able to understand and use rate of perceived exertion (RPE) scale;Knowledge and understanding of Target Heart Rate Range (THRR);Able to check pulse independently;Understanding of Exercise Prescription      Comments Pt has completed 6 exercise sessions. He is progressing well and is a very hard worker who is highly motivated. He currently exercises at 5.6 METs on the stepper. Will continue to monitor and progress as able. Pt has completed 17 exercise sessions. He continues to progress well and pushes himself every visit. He is eager for workload increases and continues to be a pleasure to work with. He currently exercises at 6.5 METs on the stepper. Will continue to monitor and progress as able.      Expected Outcomes Through exercise at rehab and by beginning a home exercise program,  the patient will be able to reach their stated goals. Through exercise at rehab and by engaging in a home exercise plan, the patient will be able to reach their goals.              Discharge Exercise Prescription (Final Exercise Prescription Changes):  Exercise Prescription Changes - 03/05/20 1000      Response to Exercise   Blood Pressure (Admit) 124/70    Blood Pressure (Exercise) 152/60    Blood Pressure (Exit) 112/68    Heart Rate (Admit) 77 bpm    Heart Rate (Exercise) 113 bpm    Heart Rate (Exit) 85 bpm    Rating of Perceived Exertion (Exercise) 12    Duration Continue with 30 min of aerobic exercise without signs/symptoms of physical  distress.    Intensity THRR unchanged      Progression   Progression Continue to progress workloads to maintain intensity without signs/symptoms of physical distress.      Resistance Training   Training Prescription Yes    Weight 4 lbs    Reps 10-15      NuStep   Level 4    SPM 135    Minutes 17    METs 4.4      Recumbant Elliptical   Level 3    RPM 77    Watts 115    Minutes 22    METs 6.6           Nutrition:  Target Goals: Understanding of nutrition guidelines, daily intake of sodium 1500mg , cholesterol 200mg , calories 30% from fat and 7% or less from saturated fats, daily to have 5 or more servings of fruits and vegetables.  Biometrics:  Pre Biometrics - 01/23/20 1452      Pre Biometrics   Height 5\' 11"  (1.803 m)    Weight 123.5 kg    Waist Circumference 49 inches    Hip Circumference 43 inches    Waist to Hip Ratio 1.14 %    BMI (Calculated) 37.99    Triceps Skinfold 19 mm    % Body Fat 35.7 %    Grip Strength 32.5 kg    Flexibility 0 in    Single Leg Stand 4.58 seconds            Nutrition Therapy Plan and Nutrition Goals:  Nutrition Therapy & Goals - 03/10/20 1330      Personal Nutrition Goals   Comments Patient continues to say he is trying to cut back on his sweets and is trying to make healthier choices working with his wife. Will continue to monitor.      Intervention Plan   Intervention Nutrition handout(s) given to patient.           Nutrition Assessments:  Nutrition Assessments - 01/23/20 1440      MEDFICTS Scores   Pre Score 123           Nutrition Goals Re-Evaluation:   Nutrition Goals Discharge (Final Nutrition Goals Re-Evaluation):   Psychosocial: Target Goals: Acknowledge presence or absence of significant depression and/or stress, maximize coping skills, provide positive support system. Participant is able to verbalize types and ability to use techniques and skills needed for reducing stress and  depression.  Initial Review & Psychosocial Screening:  Initial Psych Review & Screening - 01/23/20 1444      Initial Review   Current issues with None Identified      Family Dynamics   Good Support System? Yes    Comments  Patient has no psychosocial issues identified at his orientation visit. He states he has very good family support and is very excited to start the program and get back to work. He does have prostate cancer which he is currently being treated for but has a positive outlook. Will continue to monitor.      Barriers   Psychosocial barriers to participate in program There are no identifiable barriers or psychosocial needs.      Screening Interventions   Interventions Encouraged to exercise    Expected Outcomes Short Term goal: Identification and review with participant of any Quality of Life or Depression concerns found by scoring the questionnaire.;Long Term goal: The participant improves quality of Life and PHQ9 Scores as seen by post scores and/or verbalization of changes           Quality of Life Scores:  Quality of Life - 01/23/20 1454      Quality of Life   Select Quality of Life      Quality of Life Scores   Health/Function Pre 21 %    Socioeconomic Pre 24.88 %    Psych/Spiritual Pre 23.57 %    Family Pre 28.8 %    GLOBAL Pre 23.51 %          Scores of 19 and below usually indicate a poorer quality of life in these areas.  A difference of  2-3 points is a clinically meaningful difference.  A difference of 2-3 points in the total score of the Quality of Life Index has been associated with significant improvement in overall quality of life, self-image, physical symptoms, and general health in studies assessing change in quality of life.  PHQ-9: Recent Review Flowsheet Data    Depression screen [facility] 2/9 01/23/2020   Decreased Interest 0   Down, Depressed, Hopeless 0   PHQ - 2 Score 0   Altered sleeping 0   Tired, decreased energy 1   Change in appetite  0   Feeling bad or failure about yourself  0   Trouble concentrating 0   Moving slowly or fidgety/restless 0   Suicidal thoughts 0   PHQ-9 Score 1     Interpretation of Total Score  Total Score Depression Severity:  1-4 = Minimal depression, 5-9 = Mild depression, 10-14 = Moderate depression, 15-19 = Moderately severe depression, 20-27 = Severe depression   Psychosocial Evaluation and Intervention:  Psychosocial Evaluation - 01/23/20 1504      Psychosocial Evaluation & Interventions   Interventions Encouraged to exercise with the program and follow exercise prescription;Stress management education;Relaxation education    Comments Patient has no psychosocial issues identified at his orientation visit. His initial QOL was 27.58% and his PHQ-9 was 1. Will continue to monitor.    Expected Outcomes Patient will have no psychosocial issues identified at discharge.    Continue Psychosocial Services  No Follow up required           Psychosocial Re-Evaluation:  Psychosocial Re-Evaluation    [place] Name 02/11/20 1254 03/10/20 1334           Psychosocial Re-Evaluation   Current issues with None Identified --      Comments Patient's initial QOL score was 23.51 and his PHQ-9 score was 1. He continue to have no psychosocial issues identified. Will continue to monitor. Patient continues to have no psychosocial issues identified. Continues to have positive outlook.  Will continue to monitor for progress.      Expected Outcomes Patient will have no  psychosocial issues identified at discharge. Patient will have no psychosocial issues identified at discharge.      Interventions Stress management education;Encouraged to attend Cardiac Rehabilitation for the exercise;Relaxation education Stress management education;Encouraged to attend Cardiac Rehabilitation for the exercise;Relaxation education      Continue Psychosocial Services  No Follow up required No Follow up required              Psychosocial Discharge (Final Psychosocial Re-Evaluation):  Psychosocial Re-Evaluation - 03/10/20 1334      Psychosocial Re-Evaluation   Comments Patient continues to have no psychosocial issues identified. Continues to have positive outlook.  Will continue to monitor for progress.    Expected Outcomes Patient will have no psychosocial issues identified at discharge.    Interventions Stress management education;Encouraged to attend Cardiac Rehabilitation for the exercise;Relaxation education    Continue Psychosocial Services  No Follow up required           Vocational Rehabilitation: Provide vocational rehab assistance to qualifying candidates.   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 He does not need vocational rehab.           Education: Education Goals: Education classes will be provided on a weekly basis, covering required topics. Participant will state understanding/return demonstration of topics presented.  Learning Barriers/Preferences:  Learning Barriers/Preferences - 01/23/20 1440      Learning Barriers/Preferences   Learning Barriers None    Learning Preferences Audio;Pictoral           Education Topics: Hypertension, Hypertension Reduction -Define heart disease and high blood pressure. Discus how high blood pressure affects the body and ways to reduce high blood pressure.   Exercise and Your Heart -Discuss why it is important to exercise, the FITT principles of exercise, normal and abnormal responses to exercise, and how to exercise safely.   Angina -Discuss definition of angina, causes of angina, treatment of angina, and how to decrease risk of having angina.   Cardiac  Medications -Review what the following cardiac medications are used for, how they affect the body, and side effects that may occur when taking the medications.  Medications include Aspirin, Beta blockers, calcium channel blockers, ACE Inhibitors, angiotensin receptor blockers, diuretics, digoxin, and antihyperlipidemics.   Congestive Heart Failure -Discuss the definition of CHF, how to live with CHF, the signs and symptoms of CHF, and how keep track of weight and sodium intake.   Heart Disease and Intimacy -Discus the effect sexual activity has on the heart, how changes occur during intimacy as we age, and safety during sexual activity.   Smoking Cessation / COPD -Discuss different methods to quit smoking, the health benefits of quitting smoking, and the definition of COPD.   CARDIAC REHAB PHASE II EXERCISE from 03/12/2020 in [place]  Date 01/30/20  Educator DF  Instruction Review Code 2- Demonstrated Understanding      Nutrition I: Fats -Discuss the types of cholesterol, what cholesterol does to the heart, and how cholesterol levels can be controlled.   Nutrition II: Labels -Discuss the different components of food labels and how to read food label   CARDIAC REHAB PHASE II EXERCISE from 03/12/2020 in [place]  Date 02/13/20  Educator DF  Instruction Review Code 1- Verbalizes Understanding      Heart Parts/Heart Disease and PAD -Discuss the anatomy of the heart, the pathway of blood circulation through the heart, and these are affected by heart disease.   Stress I: Signs and Symptoms -Discuss the causes of stress, how stress may lead to anxiety and depression, and ways to limit stress.   CARDIAC REHAB PHASE II EXERCISE from 03/12/2020 in [place]  Date 02/27/20  Educator DF  Instruction Review Code 2- Demonstrated Understanding      Stress II: Relaxation -Discuss different types of relaxation  techniques to limit stress.   Warning Signs of Stroke / TIA -Discuss definition of a stroke, what the signs and symptoms are of a stroke, and how to identify when someone is having stroke.   CARDIAC REHAB PHASE II EXERCISE from 03/12/2020 in [place]  Date 03/12/20  Educator DJ  Instruction Review Code 1- Verbalizes Understanding      Knowledge Questionnaire Score:  Knowledge Questionnaire Score - 01/23/20 1441      Knowledge Questionnaire Score   Pre Score 21/24           Core Components/Risk Factors/Patient Goals at Admission:  Personal Goals and Risk Factors at Admission - 01/23/20 1441      Core Components/Risk Factors/Patient Goals on Admission    Weight Management Obesity    Diabetes Yes    Intervention Provide education about signs/symptoms and action to take for hypo/hyperglycemia.;Provide education about proper nutrition, including hydration, and aerobic/resistive exercise prescription along with prescribed medications to achieve blood glucose in normal ranges: Fasting glucose 65-99 mg/dL    Expected Outcomes Short Term: Participant verbalizes understanding of the signs/symptoms and immediate care of hyper/hypoglycemia, proper foot care and importance of medication, aerobic/resistive exercise and nutrition plan for blood glucose control.    Personal Goal Other Yes    Personal Goal Get in better shape. Be able to return to work.    Intervention Patient will attend CR 3 days/week and supplement with exercise at home 2 days/week.    Expected Outcomes Patient will meet both program and personal goals.           Core Components/Risk Factors/Patient Goals Review:   Goals and Risk Factor Review    Row Name 02/11/20 1256 03/10/20 1330           Core Components/Risk Factors/Patient Goals Review   Personal Goals Review Weight Management/Obesity;Diabetes;Other  Get in shape; get back to work. Weight Management/Obesity;Diabetes;Other  Get in shape;  get back to work.      Review Patient has completed 7 sessions losing 8 lbs since his intial visit. He is doing well in the program with consistent attendance and progression. He says he is feeling stronger and has more energy. 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 He says he is feeling like he is working toward getting in shape and getting his heart stronger. Will continue to monitor for progress. Patient has completed 18 sessions losing 3 lbs since last 30 day review. He continues to do well in the program with progressions and consistent attendance. He continues to be followed by oncologist with treatment of prostate cance with bone metatasis. He says he continues to feel stronger and has more energy and feels like he is getting in shape. 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 Will continue to monitor for progress.  Expected Outcomes Patient will complete the program meeting both program and personal goals. Patient will complete the program meeting both program and personal goals.             Core Components/Risk Factors/Patient Goals at Discharge (Final Review):   Goals and Risk Factor Review - 03/10/20 1330      Core Components/Risk Factors/Patient Goals Review   Personal Goals Review Weight Management/Obesity;Diabetes;Other   Get in shape; get back to work.   Review Patient has completed 18 sessions losing 3 lbs since last 30 day review. He continues to do well in the program with progressions and consistent attendance. He continues to be followed by oncologist with treatment of prostate cance with bone metatasis. He says he continues to feel stronger and has more energy and feels like he is getting in shape. 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 Will continue to monitor for progress.    Expected Outcomes Patient will complete the program meeting both program and personal goals.           ITP  Comments:   Comments: ITP REVIEW Pt is making expected progress toward Cardiac Rehab goals after completing 18 sessions. Recommend continued exercise, life style modification, education, and increased stamina and strength.

## 2020-03-12 NOTE — Progress Notes (Addendum)
Daily Session Note  Patient Details  Name: Hunter Jones MRN: [id number] Date of Birth: 03/04/1956 Referring Provider:     [place] from 01/23/2020 in [place]  Referring Provider Domenic Polite      Encounter Date: 03/12/2020  Check In:  Session Check In - 03/12/20 0930      Check-In   Supervising physician immediately available to respond to emergencies See telemetry face sheet for immediately available MD    Location [facility]    Staff Present Geanie Cooley, Kermit Balo, RN, Bjorn Loser, MS, ACSM-CEP, Exercise Physiologist    Virtual Visit No    Medication changes reported     No    Fall or balance concerns reported    No    Tobacco Cessation No Change    Warm-up and Cool-down Performed as group-led instruction    Resistance Training Performed Yes    VAD Patient? No    PAD/SET Patient? No      Pain Assessment   Currently in Pain? No/denies    Pain Score 0-No pain    Multiple Pain Sites No           Capillary Blood Glucose: No results found for this or any previous visit (from the past 24 hour(s)).    Social History   Tobacco Use  Smoking Status Former Smoker  . Years: 40.00  . Types: Cigarettes  . Quit date: 06/21/2012  . Years since quitting: 7.7  Smokeless Tobacco Never Used    Goals Met:  Independence with exercise equipment Exercise tolerated well No report of cardiac concerns or symptoms Strength training completed today  Goals Unmet:  Not Applicable  Comments: check out @ 10:30   Dr. Kathie Dike is Medical Director for [facility] Pulmonary Rehab.

## 2020-03-13 ENCOUNTER — Other Ambulatory Visit: Payer: Self-pay

## 2020-03-13 ENCOUNTER — Inpatient Hospital Stay (HOSPITAL_COMMUNITY): Payer: Medicare Other

## 2020-03-13 ENCOUNTER — Encounter (HOSPITAL_COMMUNITY): Payer: Self-pay

## 2020-03-13 VITALS — BP 132/58 | HR 73 | Temp 97.1°F | Resp 18

## 2020-03-13 DIAGNOSIS — C61 Malignant neoplasm of prostate: Secondary | ICD-10-CM

## 2020-03-13 DIAGNOSIS — Z5111 Encounter for antineoplastic chemotherapy: Secondary | ICD-10-CM | POA: Diagnosis not present

## 2020-03-13 LAB — CBC WITH DIFFERENTIAL/PLATELET
Abs Immature Granulocytes: 0.01 10*3/uL (ref 0.00–0.07)
Basophils Absolute: 0 10*3/uL (ref 0.0–0.1)
Basophils Relative: 0 %
Eosinophils Absolute: 0.3 10*3/uL (ref 0.0–0.5)
Eosinophils Relative: 5 %
HCT: 40 % (ref 39.0–52.0)
Hemoglobin: 13.5 g/dL (ref 13.0–17.0)
Immature Granulocytes: 0 %
Lymphocytes Relative: 33 %
Lymphs Abs: 2.2 10*3/uL (ref 0.7–4.0)
MCH: 32.9 pg (ref 26.0–34.0)
MCHC: 33.8 g/dL (ref 30.0–36.0)
MCV: 97.6 fL (ref 80.0–100.0)
Monocytes Absolute: 0.5 10*3/uL (ref 0.1–1.0)
Monocytes Relative: 8 %
Neutro Abs: 3.7 10*3/uL (ref 1.7–7.7)
Neutrophils Relative %: 54 %
Platelets: 150 10*3/uL (ref 150–400)
RBC: 4.1 MIL/uL — ABNORMAL LOW (ref 4.22–5.81)
RDW: 13.7 % (ref 11.5–15.5)
WBC: 6.8 10*3/uL (ref 4.0–10.5)
nRBC: 0 % (ref 0.0–0.2)

## 2020-03-13 LAB — COMPREHENSIVE METABOLIC PANEL
ALT: 28 U/L (ref 0–44)
AST: 28 U/L (ref 15–41)
Albumin: 3.6 g/dL (ref 3.5–5.0)
Alkaline Phosphatase: 83 U/L (ref 38–126)
Anion gap: 8 (ref 5–15)
BUN: 31 mg/dL — ABNORMAL HIGH (ref 8–23)
CO2: 24 mmol/L (ref 22–32)
Calcium: 9.1 mg/dL (ref 8.9–10.3)
Chloride: 107 mmol/L (ref 98–111)
Creatinine, Ser: 1.34 mg/dL — ABNORMAL HIGH (ref 0.61–1.24)
GFR calc Af Amer: >60 mL/min (ref >60)
GFR calc non Af Amer: 55 mL/min — ABNORMAL LOW (ref >60)
Glucose, Bld: 133 mg/dL — ABNORMAL HIGH (ref 70–99)
Potassium: 4.3 mmol/L (ref 3.5–5.1)
Sodium: 139 mmol/L (ref 135–145)
Total Bilirubin: 0.6 mg/dL (ref 0.3–1.2)
Total Protein: 6.3 g/dL — ABNORMAL LOW (ref 6.5–8.1)

## 2020-03-13 MED ORDER — DENOSUMAB 120 MG/1.7ML ~~LOC~~ SOLN
120.0000 mg | Freq: Once | SUBCUTANEOUS | Status: AC
  Administered 2020-03-13: 120 mg via SUBCUTANEOUS

## 2020-03-13 MED ORDER — DENOSUMAB 120 MG/1.7ML ~~LOC~~ SOLN
SUBCUTANEOUS | Status: AC
  Filled 2020-03-13: qty 1.7

## 2020-03-13 NOTE — Progress Notes (Signed)
Patient states he is taking his calcium-vit. D medication. No other issue to report regarding his teeth.   Xgeva injection given per orders. Patient tolerated it well without problems. Vitals stable and discharged home from clinic ambulatory in stable condition.  Follow up as scheduled.

## 2020-03-14 ENCOUNTER — Encounter (HOSPITAL_COMMUNITY): Payer: Medicare Other

## 2020-03-14 ENCOUNTER — Other Ambulatory Visit: Payer: Self-pay

## 2020-03-14 ENCOUNTER — Other Ambulatory Visit: Payer: Medicare Other

## 2020-03-14 DIAGNOSIS — Z20822 Contact with and (suspected) exposure to covid-19: Secondary | ICD-10-CM

## 2020-03-15 LAB — SARS-COV-2, NAA 2 DAY TAT

## 2020-03-15 LAB — NOVEL CORONAVIRUS, NAA: SARS-CoV-2, NAA: NOT DETECTED

## 2020-03-17 ENCOUNTER — Encounter (HOSPITAL_COMMUNITY): Payer: Medicare Other

## 2020-03-19 ENCOUNTER — Encounter (HOSPITAL_COMMUNITY)
Admission: RE | Admit: 2020-03-19 | Discharge: 2020-03-19 | Disposition: A | Discharge status: Home / Self Care | Payer: Medicare Other | Source: Ambulatory Visit | Attending: Cardiology | Admitting: Cardiology

## 2020-03-19 ENCOUNTER — Other Ambulatory Visit: Payer: Self-pay

## 2020-03-19 VITALS — Wt 261.9 lb

## 2020-03-19 DIAGNOSIS — Z955 Presence of coronary angioplasty implant and graft: Secondary | ICD-10-CM | POA: Insufficient documentation

## 2020-03-19 DIAGNOSIS — I213 ST elevation (STEMI) myocardial infarction of unspecified site: Secondary | ICD-10-CM | POA: Diagnosis not present

## 2020-03-19 NOTE — Progress Notes (Signed)
Daily Session Note  Patient Details  Name: Hunter Jones MRN: [id number] Date of Birth: [birth year] Referring Provider:     [place] from 01/23/2020 in [place]  Referring Provider Domenic Polite      Encounter Date: 03/19/2020  Check In:  Session Check In - 03/19/20 0939      Check-In   Supervising physician immediately available to respond to emergencies See telemetry face sheet for immediately available MD    Location [facility]    Staff Present Hoy Register, MS, ACSM-CEP, Exercise Physiologist;Debra Wynetta Emery, RN, BSN    Virtual Visit No    Medication changes reported     No    Fall or balance concerns reported    No    Tobacco Cessation No Change    Warm-up and Cool-down Performed as group-led instruction    Resistance Training Performed Yes    VAD Patient? No    PAD/SET Patient? No      Pain Assessment   Currently in Pain? No/denies    Pain Score 0-No pain    Multiple Pain Sites No           Capillary Blood Glucose: No results found for this or any previous visit (from the past 24 hour(s)).    Social History   Tobacco Use  Smoking Status Former Smoker  . Years: 40.00  . Types: Cigarettes  . Quit date: 06/21/2012  . Years since quitting: 7.7  Smokeless Tobacco Never Used    Goals Met:  Independence with exercise equipment Exercise tolerated well No report of cardiac concerns or symptoms Strength training completed today  Goals Unmet:  Not Applicable  Comments: checkout time is 1030   Dr. Kathie Dike is Medical Director for [facility] Pulmonary Rehab.

## 2020-03-21 ENCOUNTER — Encounter (HOSPITAL_COMMUNITY)
Admission: RE | Admit: 2020-03-21 | Discharge: 2020-03-21 | Disposition: A | Discharge status: Home / Self Care | Payer: Medicare Other | Source: Ambulatory Visit | Attending: Cardiology | Admitting: Cardiology

## 2020-03-21 ENCOUNTER — Other Ambulatory Visit: Payer: Self-pay

## 2020-03-21 DIAGNOSIS — I213 ST elevation (STEMI) myocardial infarction of unspecified site: Secondary | ICD-10-CM | POA: Diagnosis not present

## 2020-03-21 DIAGNOSIS — Z955 Presence of coronary angioplasty implant and graft: Secondary | ICD-10-CM

## 2020-03-21 NOTE — Progress Notes (Signed)
Daily Session Note  Patient Details  Name: PONCIANO SHEALY MRN: [id number] Date of Birth: [birth year] Referring Provider:     [place] from 01/23/2020 in [place]  Referring Provider Domenic Polite      Encounter Date: 03/21/2020  Check In:  Session Check In - 03/21/20 1001      Check-In   Supervising physician immediately available to respond to emergencies See telemetry face sheet for immediately available MD    Location [facility]    Staff Present Hoy Register, MS, ACSM-CEP, Exercise Physiologist;Debra Wynetta Emery, RN, BSN    Virtual Visit No    Medication changes reported     No    Fall or balance concerns reported    No    Tobacco Cessation No Change    Warm-up and Cool-down Performed as group-led instruction    Resistance Training Performed Yes    VAD Patient? No    PAD/SET Patient? No      Pain Assessment   Currently in Pain? No/denies    Pain Score 0-No pain    Multiple Pain Sites No           Capillary Blood Glucose: No results found for this or any previous visit (from the past 24 hour(s)).    Social History   Tobacco Use  Smoking Status Former Smoker  . Years: 40.00  . Types: Cigarettes  . Quit date: 06/21/2012  . Years since quitting: 7.7  Smokeless Tobacco Never Used    Goals Met:  Independence with exercise equipment Exercise tolerated well No report of cardiac concerns or symptoms Strength training completed today  Goals Unmet:  Not Applicable  Comments: checkout time is 1030   Dr. Kathie Dike is Medical Director for [facility] Pulmonary Rehab.

## 2020-03-23 ENCOUNTER — Encounter: Payer: Self-pay | Admitting: Cardiology

## 2020-03-23 NOTE — Progress Notes (Signed)
Cardiology Office Note  Date: 03/24/2020   ID: AZAHEL BELCASTRO, DOB [birth year], MRN [id number]  PCP:  Monico Blitz, MD  Cardiologist:  Rozann Lesches, MD Electrophysiologist:  None   Chief Complaint  Patient presents with  . Cardiac follow-up    History of Present Illness: Hunter Jones is a 64 y.o. male last seen in July by Mr. Leonides Sake NP.  He presents for a routine visit.  States that he continues in cardiac rehabilitation, anticipates approximately 4 more weeks.  He is doing well without active angina.  We talked about continuing a regular walking plan as well.  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  I reviewed his cardiac medications which are outlined below.  He does not report any spontaneous or major bleeding problems on dual antiplatelet therapy.  I reviewed his lab work.  He continues to follow regularly with PCP.  Past Medical History:  Diagnosis Date  . Acute ST elevation myocardial infarction (STEMI) of inferior wall ([place]) 11/2019  . Asthma   . COPD (chronic obstructive pulmonary disease) ([place])   . Coronary artery disease    DES to mid LAD August 2014 ([facility]); overlapping DESx2 prox-mid RCA 11/2019  . Essential hypertension   . Hyperthyroidism   . NSTEMI (non-ST elevated myocardial infarction) ([place]) 2014  . Prostate cancer ([place])   . Type 2 diabetes mellitus ([place])     Past Surgical History:  Procedure Laterality Date  . APPENDECTOMY    . CORONARY/GRAFT ACUTE MI REVASCULARIZATION N/A 12/10/2019   Procedure: Coronary/Graft Acute MI Revascularization;  Surgeon: Nelva Bush, MD;  Location: [place] CV [facility];  Service: Cardiovascular;  Laterality: N/A;  . LEFT HEART CATH AND CORONARY ANGIOGRAPHY N/A 12/10/2019   Procedure: LEFT HEART CATH AND CORONARY ANGIOGRAPHY;  Surgeon: Nelva Bush, MD;  Location: [place] CV [facility];  Service: Cardiovascular;  Laterality: N/A;  . LYMPHADENECTOMY Bilateral 06/27/2014   Procedure:  LYMPHADENECTOMY;  Surgeon: Raynelle Bring, MD;  Location: [facility];  Service: Urology;  Laterality: Bilateral;  . ROBOT ASSISTED LAPAROSCOPIC RADICAL PROSTATECTOMY N/A 06/27/2014   Procedure: ROBOTIC ASSISTED LAPAROSCOPIC RADICAL PROSTATECTOMY LEVEL 3;  Surgeon: Raynelle Bring, MD;  Location: [facility];  Service: Urology;  Laterality: N/A;  . TONSILLECTOMY      Current Outpatient Medications  Medication Sig Dispense Refill  . albuterol (PROVENTIL HFA;VENTOLIN HFA) 108 (90 BASE) MCG/ACT inhaler Inhale 1-2 puffs into the lungs every 6 (six) hours as needed for wheezing or shortness of breath.     [name] Kitchen aspirin EC 81 MG tablet Take 81 mg by mouth daily.     [name] Kitchen atorvastatin (LIPITOR) 40 MG tablet TAKE 1 TABLET BY MOUTH AT BEDTIME. (Patient taking differently: Take 40 mg by mouth at bedtime. ) 30 tablet 6  . Calcium-Magnesium-Vitamin D (CALCIUM 500 PO) Take 2 tablets by mouth daily with breakfast. Per pt he takes 600mg  BID    . Co-Enzyme Q-10 100 MG CAPS Take 1 capsule by mouth daily.    [name] Kitchen denosumab (XGEVA) 120 MG/1.7ML SOLN injection Inject 120 mg into the skin every 30 (thirty) days.    . empagliflozin (JARDIANCE) 10 MG TABS tablet Take 1 tablet (10 mg total) by mouth daily. 30 tablet 6  . fluticasone-salmeterol (ADVAIR HFA) 115-21 MCG/ACT inhaler Inhale 2 puffs into the lungs 2 (two) times daily.     [name] Kitchen KRILL OIL PO Take 1 capsule by mouth daily.     [name] Kitchen Leuprolide Acetate, 3 Month, (LUPRON DEPOT, 53-MONTH, IM) Inject  into the muscle every 3 (three) months.    [name] Kitchen losartan (COZAAR) 25 MG tablet Take 25 mg by mouth daily.     . methimazole (TAPAZOLE) 10 MG tablet Take 10 mg by mouth every other day.     . metoprolol succinate (TOPROL XL) 25 MG 24 hr tablet Take 1 tablet (25 mg total) by mouth daily. 90 tablet 1  . Multiple Vitamin (MULTIVITAMIN) tablet Take 1 tablet by mouth daily.    . nitroGLYCERIN (NITROSTAT) 0.4 MG SL tablet Place 1 tablet (0.4 mg total) under the tongue every 5 (five) minutes as needed. 25  tablet 12  . Potassium 99 MG TABS Take 1 tablet by mouth daily.    . ticagrelor (BRILINTA) 90 MG TABS tablet Take 1 tablet (90 mg total) by mouth 2 (two) times daily. 60 tablet 11  . triamcinolone cream (KENALOG) 0.1 % Apply 1 application topically daily as needed (For lg rash).      No current facility-administered medications for this visit.   Allergies:  Xtandi [enzalutamide] and Penicillins   ROS:  No palpitations or syncope.  Physical Exam: VS:  BP 134/80   Pulse 74   Ht 5' 10.5" (1.791 m)   Wt 264 lb (119.7 kg)   SpO2 97%   BMI 37.34 kg/m , BMI Body mass index is 37.34 kg/m.  Wt Readings from Last 3 Encounters:  03/24/20 264 lb (119.7 kg)  03/19/20 261 lb 14.5 oz (118.8 kg)  03/05/20 266 lb 1.5 oz (120.7 kg)    General: Obese male, appears comfortable at rest. HEENT: Conjunctiva and lids normal, wearing a mask. Neck: Supple, no elevated JVP or carotid bruits, no thyromegaly. Lungs: Clear to auscultation, nonlabored breathing at rest. Cardiac: Regular rate and rhythm, no S3 or significant systolic murmur. Extremities: No pitting edema, distal pulses 2+.  ECG:  An ECG dated 12/10/2019 was personally reviewed today and demonstrated:  Sinus rhythm with recent/acute inferior STEMI, inferior Q waves.  Recent Labwork: 03/13/2020: ALT 28; AST 28; BUN 31; Creatinine, Ser 1.34; Hemoglobin 13.5; Platelets 150; Potassium 4.3; Sodium 139     Component Value Date/Time   CHOL 68 12/10/2019 2351   TRIG 91 12/10/2019 2351   HDL 28 (L) 12/10/2019 2351   CHOLHDL 2.4 12/10/2019 2351   VLDL 18 12/10/2019 2351   LDLCALC 22 12/10/2019 2351    Other Studies Reviewed Today:  Cardiac catheterization 12/10/2019: Conclusions: 1. Severe single-vessel coronary artery disease with thrombotic occlusion of the proximal RCA. 2. Moderate proximal RCA and mid LCx disease. 3. Widely patent overlapping LAD stents. 4. Upper normal left ventricular filling pressure. 5. Successful PCI to  proximal/mid RCA using overlapping Resolute Onyx 3.5 x 18 mm and 3.0 x 34 mm drug-eluting stents with 0% residual stenosis and TIMI-3 flow.  Echocardiogram 12/10/2019: 1. Left ventricular ejection fraction, by estimation, is 55 to 60%. The  left ventricle has normal function. Left ventricular endocardial border  not optimally defined to evaluate regional wall motion. There is mild left  ventricular hypertrophy. Left  ventricular diastolic parameters were normal.  2. Poor acoustic windows limit study, even with Definity RV is not well  seen to accurately assess LVEF . Right ventricular systolic function was  not well visualized. The right ventricular size is normal.  3. The mitral valve is normal in structure. Trivial mitral valve  regurgitation.  4. The aortic valve is normal in structure. Aortic valve regurgitation is  not visualized.  5. The inferior vena cava is dilated in  size with <50% respiratory  variability, suggesting right atrial pressure of 15 mmHg.  Assessment and Plan:  1.  CAD status post DES to the mid LAD in 2014, more recently overlapping DES x2 to the proximal and mid RCA in June in the setting of inferior STEMI.  He is doing well at this time, continues in cardiac rehabilitation.  We will continue with observation on medical therapy.  Currently on aspirin, Brilinta, Lipitor, Jardiance, losartan, Toprol-XL, and as needed nitroglycerin.  2.  Mixed hyperlipidemia, on Lipitor.  Last LDL 22.  Medication Adjustments/Labs and Tests Ordered: Current medicines are reviewed at length with the patient today.  Concerns regarding medicines are outlined above.   Tests Ordered: No orders of the defined types were placed in this encounter.   Medication Changes: No orders of the defined types were placed in this encounter.   Disposition:  Follow up 6 months in the [place] office.  Signed, Satira Sark, MD, [facility] 03/24/2020 9:02 AM    [facility]  at [place], [place], [place] [address] Phone: [phone]; Fax: [phone]

## 2020-03-24 ENCOUNTER — Other Ambulatory Visit: Payer: Self-pay

## 2020-03-24 ENCOUNTER — Encounter: Payer: Self-pay | Admitting: Cardiology

## 2020-03-24 ENCOUNTER — Ambulatory Visit (INDEPENDENT_AMBULATORY_CARE_PROVIDER_SITE_OTHER): Payer: Medicare Other | Admitting: Cardiology

## 2020-03-24 ENCOUNTER — Encounter (HOSPITAL_COMMUNITY)
Admission: RE | Admit: 2020-03-24 | Discharge: 2020-03-24 | Disposition: A | Discharge status: Home / Self Care | Payer: Medicare Other | Source: Ambulatory Visit | Attending: Cardiology | Admitting: Cardiology

## 2020-03-24 VITALS — BP 134/80 | HR 74 | Ht 70.5 in | Wt 264.0 lb

## 2020-03-24 DIAGNOSIS — I25119 Atherosclerotic heart disease of native coronary artery with unspecified angina pectoris: Secondary | ICD-10-CM

## 2020-03-24 DIAGNOSIS — I213 ST elevation (STEMI) myocardial infarction of unspecified site: Secondary | ICD-10-CM

## 2020-03-24 DIAGNOSIS — I2111 ST elevation (STEMI) myocardial infarction involving right coronary artery: Secondary | ICD-10-CM

## 2020-03-24 DIAGNOSIS — E782 Mixed hyperlipidemia: Secondary | ICD-10-CM

## 2020-03-24 DIAGNOSIS — Z955 Presence of coronary angioplasty implant and graft: Secondary | ICD-10-CM

## 2020-03-24 NOTE — Patient Instructions (Addendum)

## 2020-03-24 NOTE — Progress Notes (Signed)
Daily Session Note  Patient Details  Name: TRAVEION RUDDOCK MRN: [id number] Date of Birth: 03/30/1956 Referring Provider:     [place] from 01/23/2020 in [place]  Referring Provider Domenic Polite      Encounter Date: 03/24/2020  Check In:  Session Check In - 03/24/20 0944      Check-In   Supervising physician immediately available to respond to emergencies See telemetry face sheet for immediately available MD    Location [facility]    Staff Present Hoy Register, MS, ACSM-CEP, Exercise Physiologist;Debra Wynetta Emery, RN, BSN    Virtual Visit No    Medication changes reported     No    Fall or balance concerns reported    No    Tobacco Cessation No Change    Warm-up and Cool-down Performed as group-led instruction    Resistance Training Performed Yes    VAD Patient? No    PAD/SET Patient? No      Pain Assessment   Currently in Pain? No/denies    Pain Score 0-No pain    Multiple Pain Sites No           Capillary Blood Glucose: No results found for this or any previous visit (from the past 24 hour(s)).    Social History   Tobacco Use  Smoking Status Former Smoker  . Years: 40.00  . Types: Cigarettes  . Quit date: 06/21/2012  . Years since quitting: 7.7  Smokeless Tobacco Never Used    Goals Met:  Independence with exercise equipment Exercise tolerated well No report of cardiac concerns or symptoms Strength training completed today  Goals Unmet:  Not Applicable  Comments: checkout time is 1030   Dr. Kathie Dike is Medical Director for [facility] Pulmonary Rehab.

## 2020-03-26 ENCOUNTER — Encounter (HOSPITAL_COMMUNITY)
Admission: RE | Admit: 2020-03-26 | Discharge: 2020-03-26 | Disposition: A | Discharge status: Home / Self Care | Payer: Medicare Other | Source: Ambulatory Visit | Attending: Cardiology | Admitting: Cardiology

## 2020-03-26 ENCOUNTER — Other Ambulatory Visit: Payer: Self-pay

## 2020-03-26 DIAGNOSIS — I213 ST elevation (STEMI) myocardial infarction of unspecified site: Secondary | ICD-10-CM | POA: Diagnosis not present

## 2020-03-26 DIAGNOSIS — Z955 Presence of coronary angioplasty implant and graft: Secondary | ICD-10-CM

## 2020-03-26 NOTE — Progress Notes (Signed)
Daily Session Note  Patient Details  Name: Hunter Jones MRN: [id number] Date of Birth: July 16, 1955 Referring Provider:     CARDIAC REHAB PHASE II ORIENTATION from 01/23/2020 in [place]  Referring Provider Domenic Polite      Encounter Date: 03/26/2020  Check In:  Session Check In - 03/26/20 0946      Check-In   Supervising physician immediately available to respond to emergencies [facility] MD immediately available    Physician(s) Dr. Domenic Polite    Location [facility]    Staff Present Hoy Register, MS, ACSM-CEP, Exercise Physiologist;Debra Wynetta Emery, RN, BSN    Virtual Visit No    Medication changes reported     No    Fall or balance concerns reported    No    Tobacco Cessation No Change    Warm-up and Cool-down Performed as group-led instruction    Resistance Training Performed Yes    VAD Patient? No    PAD/SET Patient? No      Pain Assessment   Currently in Pain? No/denies    Pain Score 0-No pain    Multiple Pain Sites No           Capillary Blood Glucose: No results found for this or any previous visit (from the past 24 hour(s)).    Social History   Tobacco Use  Smoking Status Former Smoker  . Years: 40.00  . Types: Cigarettes  . Quit date: 06/21/2012  . Years since quitting: 7.7  Smokeless Tobacco Never Used    Goals Met:  Independence with exercise equipment Exercise tolerated well No report of cardiac concerns or symptoms Strength training completed today  Goals Unmet:  Not Applicable  Comments: checkout time is 1030   Dr. Kathie Dike is Medical Director for [facility] Pulmonary Rehab.

## 2020-03-28 ENCOUNTER — Other Ambulatory Visit: Payer: Self-pay

## 2020-03-28 ENCOUNTER — Encounter (HOSPITAL_COMMUNITY)
Admission: RE | Admit: 2020-03-28 | Discharge: 2020-03-28 | Disposition: A | Discharge status: Home / Self Care | Payer: Medicare Other | Source: Ambulatory Visit | Attending: Cardiology | Admitting: Cardiology

## 2020-03-28 DIAGNOSIS — Z955 Presence of coronary angioplasty implant and graft: Secondary | ICD-10-CM

## 2020-03-28 DIAGNOSIS — I213 ST elevation (STEMI) myocardial infarction of unspecified site: Secondary | ICD-10-CM | POA: Diagnosis not present

## 2020-03-28 NOTE — Progress Notes (Signed)
Daily Session Note  Patient Details  Name: STEDMAN SUMMERVILLE MRN: [id number] Date of Birth: [birth year] Referring Provider:     [place] from 01/23/2020 in [place]  Referring Provider Domenic Polite      Encounter Date: 03/28/2020  Check In:  Session Check In - 03/28/20 0930      Check-In   Supervising physician immediately available to respond to emergencies [facility] MD immediately available    Physician(s) Dr. Harrington Challenger    Location [facility]    Staff Present Geanie Cooley, RN;Dalton Kris Mouton, MS, ACSM-CEP, Exercise Physiologist    Virtual Visit No    Medication changes reported     No    Fall or balance concerns reported    No    Tobacco Cessation No Change    Warm-up and Cool-down Performed as group-led instruction    Resistance Training Performed Yes    VAD Patient? No    PAD/SET Patient? No      Pain Assessment   Currently in Pain? No/denies    Pain Score 0-No pain    Multiple Pain Sites No           Capillary Blood Glucose: No results found for this or any previous visit (from the past 24 hour(s)).    Social History   Tobacco Use  Smoking Status Former Smoker  . Years: 40.00  . Types: Cigarettes  . Quit date: 06/21/2012  . Years since quitting: 7.7  Smokeless Tobacco Never Used    Goals Met:  Independence with exercise equipment Exercise tolerated well No report of cardiac concerns or symptoms Strength training completed today  Goals Unmet:  Not Applicable  Comments: check out 10:30   Dr. Kathie Dike is Medical Director for [facility].

## 2020-03-31 ENCOUNTER — Encounter (HOSPITAL_COMMUNITY)
Admission: RE | Admit: 2020-03-31 | Discharge: 2020-03-31 | Disposition: A | Discharge status: Home / Self Care | Payer: Medicare Other | Source: Ambulatory Visit | Attending: Cardiology | Admitting: Cardiology

## 2020-03-31 ENCOUNTER — Other Ambulatory Visit: Payer: Self-pay

## 2020-03-31 VITALS — Wt 260.8 lb

## 2020-03-31 DIAGNOSIS — I213 ST elevation (STEMI) myocardial infarction of unspecified site: Secondary | ICD-10-CM | POA: Diagnosis not present

## 2020-03-31 DIAGNOSIS — Z955 Presence of coronary angioplasty implant and graft: Secondary | ICD-10-CM

## 2020-03-31 NOTE — Progress Notes (Signed)
Daily Session Note  Patient Details  Name: Hunter Jones MRN: [id number] Date of Birth: March 13, 1956 Referring Provider:     [place] from 01/23/2020 in [place]  Referring Provider Domenic Polite      Encounter Date: 03/31/2020  Check In:  Session Check In - 03/31/20 0936      Check-In   Supervising physician immediately available to respond to emergencies [facility] MD immediately available    Physician(s) Dr. Harl Bowie    Location [facility]    Staff Present Ramon Dredge, RN, MHA;Dalton Kris Mouton, MS, ACSM-CEP, Exercise Physiologist    Medication changes reported     No    Fall or balance concerns reported    No    Tobacco Cessation No Change    Warm-up and Cool-down Performed as group-led instruction    Resistance Training Performed Yes    VAD Patient? No    PAD/SET Patient? No      Pain Assessment   Currently in Pain? No/denies    Pain Score 0-No pain    Multiple Pain Sites No           Capillary Blood Glucose: No results found for this or any previous visit (from the past 24 hour(s)).    Social History   Tobacco Use  Smoking Status Former Smoker  . Years: 40.00  . Types: Cigarettes  . Quit date: 06/21/2012  . Years since quitting: 7.7  Smokeless Tobacco Never Used    Goals Met:  Independence with exercise equipment Exercise tolerated well No report of cardiac concerns or symptoms Strength training completed today  Goals Unmet:  Not Applicable  Comments: [phone]   Dr. Kathie Dike is Medical Director for [facility].

## 2020-04-02 ENCOUNTER — Other Ambulatory Visit: Payer: Self-pay

## 2020-04-02 ENCOUNTER — Encounter (HOSPITAL_COMMUNITY)
Admission: RE | Admit: 2020-04-02 | Discharge: 2020-04-02 | Disposition: A | Discharge status: Home / Self Care | Payer: Medicare Other | Source: Ambulatory Visit | Attending: Cardiology | Admitting: Cardiology

## 2020-04-02 DIAGNOSIS — I213 ST elevation (STEMI) myocardial infarction of unspecified site: Secondary | ICD-10-CM

## 2020-04-02 DIAGNOSIS — Z955 Presence of coronary angioplasty implant and graft: Secondary | ICD-10-CM

## 2020-04-02 NOTE — Progress Notes (Signed)
Daily Session Note  Patient Details  Name: Hunter Jones MRN: [id number] Date of Birth: [birth year] Referring Provider:     [place] from 01/23/2020 in [place]  Referring Provider Domenic Polite      Encounter Date: 04/02/2020  Check In:  Session Check In - 04/02/20 0958      Check-In   Supervising physician immediately available to respond to emergencies [facility] MD immediately available    Physician(s) Domenic Polite    Location [facility]    Staff Present Geanie Cooley, Felipe Drone, RN, [facility]    Virtual Visit No    Medication changes reported     No    Fall or balance concerns reported    No    Tobacco Cessation No Change    Warm-up and Cool-down Performed as group-led instruction    Resistance Training Performed Yes    VAD Patient? No    PAD/SET Patient? No      Pain Assessment   Currently in Pain? No/denies    Pain Score 0-No pain    Multiple Pain Sites No           Capillary Blood Glucose: No results found for this or any previous visit (from the past 24 hour(s)).    Social History   Tobacco Use  Smoking Status Former Smoker  . Years: 40.00  . Types: Cigarettes  . Quit date: 06/21/2012  . Years since quitting: 7.7  Smokeless Tobacco Never Used    Goals Met:  Independence with exercise equipment Exercise tolerated well No report of cardiac concerns or symptoms Strength training completed today  Goals Unmet:  Not Applicable  Comments: check out @ 10:30   Dr. Kathie Dike is Medical Director for [facility].

## 2020-04-04 ENCOUNTER — Encounter (HOSPITAL_COMMUNITY)
Admission: RE | Admit: 2020-04-04 | Discharge: 2020-04-04 | Disposition: A | Discharge status: Home / Self Care | Payer: Medicare Other | Source: Ambulatory Visit | Attending: Cardiology | Admitting: Cardiology

## 2020-04-04 ENCOUNTER — Other Ambulatory Visit: Payer: Self-pay

## 2020-04-04 DIAGNOSIS — I213 ST elevation (STEMI) myocardial infarction of unspecified site: Secondary | ICD-10-CM | POA: Diagnosis not present

## 2020-04-04 DIAGNOSIS — Z955 Presence of coronary angioplasty implant and graft: Secondary | ICD-10-CM

## 2020-04-04 NOTE — Progress Notes (Signed)
Daily Session Note  Patient Details  Name: Hunter Jones MRN: [id number] Date of Birth: 14-Jan-1956 Referring Provider:     [place] from 01/23/2020 in [place]  Referring Provider Domenic Polite      Encounter Date: 04/04/2020  Check In:  Session Check In - 04/04/20 0932      Check-In   Supervising physician immediately available to respond to emergencies [facility] MD immediately available    Physician(s) Dr. Harl Bowie    Location [facility]    Staff Present Ramon Dredge, RN, MHA;Dalton Kris Mouton, MS, ACSM-CEP, Exercise Physiologist    Virtual Visit No    Medication changes reported     No    Fall or balance concerns reported    No    Tobacco Cessation No Change    Warm-up and Cool-down Performed as group-led instruction    Resistance Training Performed Yes    PAD/SET Patient? No      Pain Assessment   Currently in Pain? No/denies    Pain Score 0-No pain    Multiple Pain Sites No           Capillary Blood Glucose: No results found for this or any previous visit (from the past 24 hour(s)).    Social History   Tobacco Use  Smoking Status Former Smoker  . Years: 40.00  . Types: Cigarettes  . Quit date: 06/21/2012  . Years since quitting: 7.7  Smokeless Tobacco Never Used    Goals Met:  Independence with exercise equipment Exercise tolerated well No report of cardiac concerns or symptoms Strength training completed today  Goals Unmet:  Not Applicable  Comments: [phone]   Dr. Kathie Dike is Medical Director for [facility].

## 2020-04-07 ENCOUNTER — Other Ambulatory Visit: Payer: Self-pay

## 2020-04-07 ENCOUNTER — Encounter (HOSPITAL_COMMUNITY)
Admission: RE | Admit: 2020-04-07 | Discharge: 2020-04-07 | Disposition: A | Discharge status: Home / Self Care | Payer: Medicare Other | Source: Ambulatory Visit | Attending: Cardiology | Admitting: Cardiology

## 2020-04-07 VITALS — Wt 262.3 lb

## 2020-04-07 DIAGNOSIS — Z955 Presence of coronary angioplasty implant and graft: Secondary | ICD-10-CM

## 2020-04-07 DIAGNOSIS — I213 ST elevation (STEMI) myocardial infarction of unspecified site: Secondary | ICD-10-CM

## 2020-04-07 NOTE — Progress Notes (Signed)
Daily Session Note  Patient Details  Name: Hunter Jones MRN: [id number] Date of Birth: 11/12/1955 Referring Provider:     [facility] REH[facility] PHASE II ORIENTATION from 01/23/2020 in [place] [facility]  Referring Provider [place]      Encounter Date: 04/07/2020  Check In:  Session Check In - 04/07/20 0942      Check-In   Supervising physician immediately available to respond to emergencies [facility] immediately available    Physician(s) Dr. Branch    Location [facility]    Staff Present Dalton Fletcher, MS, ACSM-CEP, Exercise Physiologist;Debra Johnson, RN, BSN    Virtual Visit No    Medication changes reported     No    Fall or balance concerns reported    No    Tobacco Cessation No Change    Warm-up and Cool-down Performed as group-led instruction    Resistance Training Performed Yes    VAD Patient? No    PAD/SET Patient? No      Pain Assessment   Currently in Pain? No/denies    Pain Score 0-No pain    Multiple Pain Sites No           Capillary Blood Glucose: No results found for this or any previous visit (from the past 24 hour(s)).    Social History   Tobacco Use  Smoking Status Former Smoker  . Years: 40.00  . Types: Cigarettes  . Quit date: 06/21/2012  . Years since quitting: 7.8  Smokeless Tobacco Never Used    Goals Met:  Independence with exercise equipment Exercise tolerated well No report of cardiac concerns or symptoms Strength training completed today  Goals Unmet:  Not Applicable  Comments: checkout time is 1030   Dr. Jehanzeb Memon is Medical Director for [facility]. 

## 2020-04-09 ENCOUNTER — Inpatient Hospital Stay (HOSPITAL_COMMUNITY): Payer: Medicare Other | Attending: Hematology

## 2020-04-09 ENCOUNTER — Encounter (HOSPITAL_COMMUNITY)
Admission: RE | Admit: 2020-04-09 | Discharge: 2020-04-09 | Disposition: A | Discharge status: Home / Self Care | Payer: Medicare Other | Source: Ambulatory Visit | Attending: Cardiology | Admitting: Cardiology

## 2020-04-09 ENCOUNTER — Other Ambulatory Visit: Payer: Self-pay

## 2020-04-09 DIAGNOSIS — C7951 Secondary malignant neoplasm of bone: Secondary | ICD-10-CM | POA: Diagnosis not present

## 2020-04-09 DIAGNOSIS — I213 ST elevation (STEMI) myocardial infarction of unspecified site: Secondary | ICD-10-CM | POA: Diagnosis not present

## 2020-04-09 DIAGNOSIS — C61 Malignant neoplasm of prostate: Secondary | ICD-10-CM

## 2020-04-09 DIAGNOSIS — Z955 Presence of coronary angioplasty implant and graft: Secondary | ICD-10-CM

## 2020-04-09 LAB — CBC WITH DIFFERENTIAL/PLATELET
Abs Immature Granulocytes: 0.01 10*3/uL (ref 0.00–0.07)
Basophils Absolute: 0 10*3/uL (ref 0.0–0.1)
Basophils Relative: 1 %
Eosinophils Absolute: 0.3 10*3/uL (ref 0.0–0.5)
Eosinophils Relative: 4 %
HCT: 40.1 % (ref 39.0–52.0)
Hemoglobin: 13.6 g/dL (ref 13.0–17.0)
Immature Granulocytes: 0 %
Lymphocytes Relative: 25 %
Lymphs Abs: 1.8 10*3/uL (ref 0.7–4.0)
MCH: 33.4 pg (ref 26.0–34.0)
MCHC: 33.9 g/dL (ref 30.0–36.0)
MCV: 98.5 fL (ref 80.0–100.0)
Monocytes Absolute: 0.5 10*3/uL (ref 0.1–1.0)
Monocytes Relative: 7 %
Neutro Abs: 4.4 10*3/uL (ref 1.7–7.7)
Neutrophils Relative %: 63 %
Platelets: 149 10*3/uL — ABNORMAL LOW (ref 150–400)
RBC: 4.07 MIL/uL — ABNORMAL LOW (ref 4.22–5.81)
RDW: 13.5 % (ref 11.5–15.5)
WBC: 7 10*3/uL (ref 4.0–10.5)
nRBC: 0 % (ref 0.0–0.2)

## 2020-04-09 LAB — COMPREHENSIVE METABOLIC PANEL
ALT: 24 U/L (ref 0–44)
AST: 23 U/L (ref 15–41)
Albumin: 3.7 g/dL (ref 3.5–5.0)
Alkaline Phosphatase: 86 U/L (ref 38–126)
Anion gap: 7 (ref 5–15)
BUN: 24 mg/dL — ABNORMAL HIGH (ref 8–23)
CO2: 25 mmol/L (ref 22–32)
Calcium: 9.4 mg/dL (ref 8.9–10.3)
Chloride: 105 mmol/L (ref 98–111)
Creatinine, Ser: 1.22 mg/dL (ref 0.61–1.24)
GFR, Estimated: >60 mL/min (ref >60)
Glucose, Bld: 103 mg/dL — ABNORMAL HIGH (ref 70–99)
Potassium: 5.1 mmol/L (ref 3.5–5.1)
Sodium: 137 mmol/L (ref 135–145)
Total Bilirubin: 0.7 mg/dL (ref 0.3–1.2)
Total Protein: 6.5 g/dL (ref 6.5–8.1)

## 2020-04-09 LAB — PSA: Prostatic Specific Antigen: 4.47 ng/mL — ABNORMAL HIGH (ref 0.00–4.00)

## 2020-04-09 NOTE — Progress Notes (Signed)
Cardiac Individual Treatment Plan  Patient Details  Name: Hunter Jones MRN: [id number] Date of Birth: 1955/11/15 Referring Provider:     [place] from 01/23/2020 in [facility]  Referring Provider Domenic Polite      Initial Encounter Date:    CARDIAC REHAB PHASE II ORIENTATION from 01/23/2020 in [facility]  Date 01/23/20      Visit Diagnosis: ST elevation myocardial infarction (STEMI), unspecified artery ([place])  S/P coronary artery stent placement  Patient's Home Medications on Admission:  Current Outpatient Medications:    albuterol (PROVENTIL HFA;VENTOLIN HFA) 108 (90 BASE) MCG/ACT inhaler, Inhale 1-2 puffs into the lungs every 6 (six) hours as needed for wheezing or shortness of breath. , Disp: , Rfl:    aspirin EC 81 MG tablet, Take 81 mg by mouth daily. , Disp: , Rfl:    atorvastatin (LIPITOR) 40 MG tablet, TAKE 1 TABLET BY MOUTH AT BEDTIME. (Patient taking differently: Take 40 mg by mouth at bedtime. ), Disp: 30 tablet, Rfl: 6   Calcium-Magnesium-Vitamin D (CALCIUM 500 PO), Take 2 tablets by mouth daily with breakfast. Per pt he takes 600mg  BID, Disp: , Rfl:    Co-Enzyme Q-10 100 MG CAPS, Take 1 capsule by mouth daily., Disp: , Rfl:    denosumab (XGEVA) 120 MG/1.7ML SOLN injection, Inject 120 mg into the skin every 30 (thirty) days., Disp: , Rfl:    empagliflozin (JARDIANCE) 10 MG TABS tablet, Take 1 tablet (10 mg total) by mouth daily., Disp: 30 tablet, Rfl: 6   fluticasone-salmeterol (ADVAIR HFA) 115-21 MCG/ACT inhaler, Inhale 2 puffs into the lungs 2 (two) times daily. , Disp: , Rfl:    KRILL OIL PO, Take 1 capsule by mouth daily. , Disp: , Rfl:    Leuprolide Acetate, 3 Month, (LUPRON DEPOT, 77-MONTH, IM), Inject into the muscle every 3 (three) months., Disp: , Rfl:    losartan (COZAAR) 25 MG tablet, Take 25 mg by mouth daily. , Disp: , Rfl:    methimazole (TAPAZOLE) 10 MG tablet, Take 10 mg by mouth  every other day. , Disp: , Rfl:    metoprolol succinate (TOPROL XL) 25 MG 24 hr tablet, Take 1 tablet (25 mg total) by mouth daily., Disp: 90 tablet, Rfl: 1   Multiple Vitamin (MULTIVITAMIN) tablet, Take 1 tablet by mouth daily., Disp: , Rfl:    nitroGLYCERIN (NITROSTAT) 0.4 MG SL tablet, Place 1 tablet (0.4 mg total) under the tongue every 5 (five) minutes as needed., Disp: 25 tablet, Rfl: 12   Potassium 99 MG TABS, Take 1 tablet by mouth daily., Disp: , Rfl:    ticagrelor (BRILINTA) 90 MG TABS tablet, Take 1 tablet (90 mg total) by mouth 2 (two) times daily., Disp: 60 tablet, Rfl: 11   triamcinolone cream (KENALOG) 0.1 %, Apply 1 application topically daily as needed (For lg rash). , Disp: , Rfl:   Past Medical History: Past Medical History:  Diagnosis Date   Acute ST elevation myocardial infarction (STEMI) of inferior wall ([employer]) 11/2019   Asthma    COPD (chronic obstructive pulmonary disease) ([place])    Coronary artery disease    DES to mid LAD August 2014 ([facility]); overlapping DESx2 prox-mid RCA 11/2019   Essential hypertension    Hyperthyroidism    NSTEMI (non-ST elevated myocardial infarction) ([place]) 2014   Prostate cancer ([employer] [place])    Type 2 diabetes mellitus ([place])     Tobacco Use: Social History   Tobacco Use  Smoking Status  Former Smoker   Years: 40.00   Types: Cigarettes   Quit date: 06/21/2012   Years since quitting: 7.8  Smokeless Tobacco Never Used    Labs: Recent Review Scientist, physiological    Labs for ITP Cardiac and Pulmonary Rehab Latest Ref Rng & Units 11/20/2019 12/10/2019   Cholestrol 0 - 200 mg/dL - 68   LDLCALC 0 - 99 mg/dL - 22   HDL >40 mg/dL - 28(L)   Trlycerides <150 mg/dL - 91   Hemoglobin A1c 4.8 - 5.6 % 6.7(H) -      Capillary Blood Glucose: Lab Results  Component Value Date   GLUCAP 101 (H) 01/23/2020   GLUCAP 202 (H) 12/12/2019   GLUCAP 110 (H) 12/11/2019   GLUCAP 102 (H) 12/11/2019   GLUCAP 119 (H) 12/11/2019     Exercise Target  Goals: Exercise Program Goal: Individual exercise prescription set using results from initial 6 min walk test and THRR while considering  patients activity barriers and safety.   Exercise Prescription Goal: Starting with aerobic activity 30 plus minutes a day, 3 days per week for initial exercise prescription. Provide home exercise prescription and guidelines that participant acknowledges understanding prior to discharge.  Activity Barriers & Risk Stratification:  Activity Barriers & Cardiac Risk Stratification - 01/23/20 1444      Activity Barriers & Cardiac Risk Stratification   Activity Barriers Back Problems;Deconditioning;Other (comment)    Comments Bilateral hip pain when walking.    Cardiac Risk Stratification High           6 Minute Walk:  6 Minute Walk    Row Name 01/23/20 1441         6 Minute Walk   Phase Initial     Distance 1300 feet     Walk Time 6 minutes     # of Rest Breaks 0     MPH 2.46     METS 2.4     RPE 11     Perceived Dyspnea  11     VO2 Peak 8.41     Symptoms Yes (comment)     Comments Bilateral hip pain 3/10. He was a little SOB towards the end of walk test.     Resting HR 78 bpm     Resting BP 100/60     Resting Oxygen Saturation  98 %     Exercise Oxygen Saturation  during 6 min walk 96 %     Max Ex. HR 100 bpm     Max Ex. BP 110/64     2 Minute Post BP 102/58            Oxygen Initial Assessment:   Oxygen Re-Evaluation:   Oxygen Discharge (Final Oxygen Re-Evaluation):   Initial Exercise Prescription:  Initial Exercise Prescription - 01/23/20 1400      Date of Initial Exercise RX and Referring Provider   Date 01/23/20    Referring Provider Domenic Polite    Expected Discharge Date 04/24/20      NuStep   Level 1    SPM 80    Minutes 22      Recumbant Elliptical   Level 1    RPM 60    Minutes 17      Prescription Details   Frequency (times per week) 3    Duration Progress to 30 minutes of continuous aerobic without  signs/symptoms of physical distress      Intensity   THRR 40-80% of Max Heartrate 62/124    Ratings  of Perceived Exertion 11-13    Perceived Dyspnea 0-4      Progression   Progression Continue progressive overload as per policy without signs/symptoms or physical distress.      Resistance Training   Training Prescription Yes    Weight 2    Reps 10-15           Perform Capillary Blood Glucose checks as needed.  Exercise Prescription Changes:   Exercise Prescription Changes    Row Name 02/04/20 1200 02/13/20 1000 02/20/20 1200 03/05/20 1000 03/19/20 1100     Response to Exercise   Blood Pressure (Admit) 122/70 -- 118/76 124/70 124/72   Blood Pressure (Exercise) 134/76 -- 138/60 152/60 148/76   Blood Pressure (Exit) 110/80 -- 112/64 112/68 118/70   Heart Rate (Admit) 91 bpm -- 87 bpm 77 bpm 96 bpm   Heart Rate (Exercise) 105 bpm -- 121 bpm 113 bpm 124 bpm   Heart Rate (Exit) 99 bpm -- 96 bpm 85 bpm 93 bpm   Rating of Perceived Exertion (Exercise) 13 -- 12 12 13    Duration Continue with 30 min of aerobic exercise without signs/symptoms of physical distress. -- Continue with 30 min of aerobic exercise without signs/symptoms of physical distress. Continue with 30 min of aerobic exercise without signs/symptoms of physical distress. Continue with 30 min of aerobic exercise without signs/symptoms of physical distress.   Intensity THRR unchanged -- THRR unchanged THRR unchanged THRR unchanged     Progression   Progression Continue to progress workloads to maintain intensity without signs/symptoms of physical distress. -- Continue to progress workloads to maintain intensity without signs/symptoms of physical distress. Continue to progress workloads to maintain intensity without signs/symptoms of physical distress. Continue to progress workloads to maintain intensity without signs/symptoms of physical distress.     Resistance Training   Training Prescription Yes -- Yes Yes Yes   Weight 2  -- 3 lbs 4 lbs 4 lbs   Reps 10-15 -- 10-15 10-15 10-15   Time -- -- -- -- 10 Minutes     NuStep   Level 2 -- 4 4 4    SPM 124 -- 129 135 145   Minutes 17 -- 17 17 17    METs 4.2 -- 4.6 4.4 5.3     Recumbant Elliptical   Level 1 -- 1 3 4    RPM 68 -- 73 77 75   [place] 94 -- 105 115 --   Minutes 22 -- 22 22 22    METs 5.2 -- 5.5 6.6 3.7     Home Exercise Plan   Plans to continue exercise at -- Home (comment) -- -- --   Frequency -- Add 2 additional days to program exercise sessions. -- -- --   Initial Home Exercises Provided -- 02/13/20 -- -- --   [place] Name 03/31/20 1200 04/07/20 1300           Response to Exercise   Blood Pressure (Admit) 116/76 126/88      Blood Pressure (Exercise) 126/60 150/72      Blood Pressure (Exit) 98/64 120/76      Heart Rate (Admit) 74 bpm 76 bpm      Heart Rate (Exercise) 119 bpm 122 bpm      Heart Rate (Exit) 85 bpm 84 bpm      Rating of Perceived Exertion (Exercise) 13 14      Duration Continue with 30 min of aerobic exercise without signs/symptoms of physical distress. Continue with 30 min of aerobic exercise without  signs/symptoms of physical distress.      Intensity THRR unchanged THRR unchanged        Progression   Progression Continue to progress workloads to maintain intensity without signs/symptoms of physical distress. Continue to progress workloads to maintain intensity without signs/symptoms of physical distress.        Resistance Training   Training Prescription Yes Yes      Weight 4 lbs 5 lbs      Reps 10-15 10-15      Time 10 Minutes 10 Minutes        NuStep   Level 5 5      SPM 130 145      Minutes 17 17      METs 3.8 5.2        Recumbant Elliptical   Level 4 5      RPM 78 79      Minutes 22 22      METs 3.9 4.1             Exercise Comments:   Exercise Comments    Row Name 02/13/20 1007           Exercise Comments home exercise reviewed              Exercise Goals and Review:   Exercise Goals    Row  Name 01/23/20 1451 02/11/20 1031 03/10/20 1252 04/07/20 1344       Exercise Goals   Increase Physical Activity Yes Yes Yes Yes    Intervention Provide advice, education, support and counseling about physical activity/exercise needs.;Develop an individualized exercise prescription for aerobic and resistive training based on initial evaluation findings, risk stratification, comorbidities and participant's personal goals. Provide advice, education, support and counseling about physical activity/exercise needs.;Develop an individualized exercise prescription for aerobic and resistive training based on initial evaluation findings, risk stratification, comorbidities and participant's personal goals. Provide advice, education, support and counseling about physical activity/exercise needs.;Develop an individualized exercise prescription for aerobic and resistive training based on initial evaluation findings, risk stratification, comorbidities and participant's personal goals. Provide advice, education, support and counseling about physical activity/exercise needs.;Develop an individualized exercise prescription for aerobic and resistive training based on initial evaluation findings, risk stratification, comorbidities and participant's personal goals.    Expected Outcomes Short Term: Attend rehab on a regular basis to increase amount of physical activity.;Long Term: Add in home exercise to make exercise part of routine and to increase amount of physical activity.;Long Term: Exercising regularly at least 3-5 days a week. Short Term: Attend rehab on a regular basis to increase amount of physical activity.;Long Term: Add in home exercise to make exercise part of routine and to increase amount of physical activity.;Long Term: Exercising regularly at least 3-5 days a week. Short Term: Attend rehab on a regular basis to increase amount of physical activity.;Long Term: Add in home exercise to make exercise part of routine and  to increase amount of physical activity.;Long Term: Exercising regularly at least 3-5 days a week. Short Term: Attend rehab on a regular basis to increase amount of physical activity.;Long Term: Add in home exercise to make exercise part of routine and to increase amount of physical activity.;Long Term: Exercising regularly at least 3-5 days a week.    Increase Strength and Stamina Yes Yes Yes Yes    Intervention -- Provide advice, education, support and counseling about physical activity/exercise needs.;Develop an individualized exercise prescription for aerobic and resistive training based on initial evaluation findings, risk stratification, comorbidities  and participant's personal goals. Provide advice, education, support and counseling about physical activity/exercise needs.;Develop an individualized exercise prescription for aerobic and resistive training based on initial evaluation findings, risk stratification, comorbidities and participant's personal goals. Provide advice, education, support and counseling about physical activity/exercise needs.;Develop an individualized exercise prescription for aerobic and resistive training based on initial evaluation findings, risk stratification, comorbidities and participant's personal goals.    Expected Outcomes Short Term: Increase workloads from initial exercise prescription for resistance, speed, and METs.;Short Term: Perform resistance training exercises routinely during rehab and add in resistance training at home;Long Term: Improve cardiorespiratory fitness, muscular endurance and strength as measured by increased METs and functional capacity (6MWT) Short Term: Increase workloads from initial exercise prescription for resistance, speed, and METs.;Short Term: Perform resistance training exercises routinely during rehab and add in resistance training at home;Long Term: Improve cardiorespiratory fitness, muscular endurance and strength as measured by increased  METs and functional capacity (6MWT) Short Term: Increase workloads from initial exercise prescription for resistance, speed, and METs.;Short Term: Perform resistance training exercises routinely during rehab and add in resistance training at home;Long Term: Improve cardiorespiratory fitness, muscular endurance and strength as measured by increased METs and functional capacity (6MWT) Short Term: Increase workloads from initial exercise prescription for resistance, speed, and METs.;Short Term: Perform resistance training exercises routinely during rehab and add in resistance training at home;Long Term: Improve cardiorespiratory fitness, muscular endurance and strength as measured by increased METs and functional capacity (6MWT)    Able to understand and use rate of perceived exertion (RPE) scale Yes Yes Yes Yes    Intervention Provide education and explanation on how to use RPE scale Provide education and explanation on how to use RPE scale Provide education and explanation on how to use RPE scale Provide education and explanation on how to use RPE scale    Expected Outcomes Short Term: Able to use RPE daily in rehab to express subjective intensity level;Long Term:  Able to use RPE to guide intensity level when exercising independently Short Term: Able to use RPE daily in rehab to express subjective intensity level;Long Term:  Able to use RPE to guide intensity level when exercising independently Short Term: Able to use RPE daily in rehab to express subjective intensity level;Long Term:  Able to use RPE to guide intensity level when exercising independently Short Term: Able to use RPE daily in rehab to express subjective intensity level;Long Term:  Able to use RPE to guide intensity level when exercising independently    Knowledge and understanding of Target Heart Rate Range (THRR) Yes Yes Yes Yes    Intervention Provide education and explanation of THRR including how the numbers were predicted and where they are  located for reference Provide education and explanation of THRR including how the numbers were predicted and where they are located for reference Provide education and explanation of THRR including how the numbers were predicted and where they are located for reference Provide education and explanation of THRR including how the numbers were predicted and where they are located for reference    Expected Outcomes Short Term: Able to state/look up THRR;Long Term: Able to use THRR to govern intensity when exercising independently;Short Term: Able to use daily as guideline for intensity in rehab Short Term: Able to state/look up THRR;Long Term: Able to use THRR to govern intensity when exercising independently;Short Term: Able to use daily as guideline for intensity in rehab Short Term: Able to state/look up THRR;Long Term: Able to use THRR to  govern intensity when exercising independently;Short Term: Able to use daily as guideline for intensity in rehab Short Term: Able to state/look up THRR;Long Term: Able to use THRR to govern intensity when exercising independently;Short Term: Able to use daily as guideline for intensity in rehab    Able to check pulse independently Yes Yes Yes Yes    Intervention Provide education and demonstration on how to check pulse in carotid and radial arteries.;Review the importance of being able to check your own pulse for safety during independent exercise Provide education and demonstration on how to check pulse in carotid and radial arteries.;Review the importance of being able to check your own pulse for safety during independent exercise Provide education and demonstration on how to check pulse in carotid and radial arteries.;Review the importance of being able to check your own pulse for safety during independent exercise Provide education and demonstration on how to check pulse in carotid and radial arteries.;Review the importance of being able to check your own pulse for safety  during independent exercise    Expected Outcomes Short Term: Able to explain why pulse checking is important during independent exercise;Long Term: Able to check pulse independently and accurately Short Term: Able to explain why pulse checking is important during independent exercise;Long Term: Able to check pulse independently and accurately Short Term: Able to explain why pulse checking is important during independent exercise;Long Term: Able to check pulse independently and accurately Short Term: Able to explain why pulse checking is important during independent exercise;Long Term: Able to check pulse independently and accurately    Understanding of Exercise Prescription Yes Yes Yes Yes    Intervention Provide education, explanation, and written materials on patient's individual exercise prescription Provide education, explanation, and written materials on patient's individual exercise prescription Provide education, explanation, and written materials on patient's individual exercise prescription Provide education, explanation, and written materials on patient's individual exercise prescription    Expected Outcomes Short Term: Able to explain program exercise prescription;Long Term: Able to explain home exercise prescription to exercise independently Short Term: Able to explain program exercise prescription;Long Term: Able to explain home exercise prescription to exercise independently Short Term: Able to explain program exercise prescription;Long Term: Able to explain home exercise prescription to exercise independently Short Term: Able to explain program exercise prescription;Long Term: Able to explain home exercise prescription to exercise independently           Exercise Goals Re-Evaluation :  Exercise Goals Re-Evaluation    [place] Name 02/11/20 1031 03/10/20 1252           Exercise Goal Re-Evaluation   Exercise Goals Review Increase Physical Activity;Increase Strength and Stamina;Able to  understand and use rate of perceived exertion (RPE) scale;Knowledge and understanding of Target Heart Rate Range (THRR);Able to check pulse independently;Understanding of Exercise Prescription Increase Physical Activity;Increase Strength and Stamina;Able to understand and use rate of perceived exertion (RPE) scale;Knowledge and understanding of Target Heart Rate Range (THRR);Able to check pulse independently;Understanding of Exercise Prescription      Comments Pt has completed 6 exercise sessions. He is progressing well and is a very hard worker who is highly motivated. He currently exercises at 5.6 METs on the stepper. Will continue to monitor and progress as able. Pt has completed 17 exercise sessions. He continues to progress well and pushes himself every visit. He is eager for workload increases and continues to be a pleasure to work with. He currently exercises at 6.5 METs on the stepper. Will continue to monitor and progress  as able.      Expected Outcomes Through exercise at rehab and by beginning a home exercise program, the patient will be able to reach their stated goals. Through exercise at rehab and by engaging in a home exercise plan, the patient will be able to reach their goals.              Discharge Exercise Prescription (Final Exercise Prescription Changes):  Exercise Prescription Changes - 04/07/20 1300      Response to Exercise   Blood Pressure (Admit) 126/88    Blood Pressure (Exercise) 150/72    Blood Pressure (Exit) 120/76    Heart Rate (Admit) 76 bpm    Heart Rate (Exercise) 122 bpm    Heart Rate (Exit) 84 bpm    Rating of Perceived Exertion (Exercise) 14    Duration Continue with 30 min of aerobic exercise without signs/symptoms of physical distress.    Intensity THRR unchanged      Progression   Progression Continue to progress workloads to maintain intensity without signs/symptoms of physical distress.      Resistance Training   Training Prescription Yes     Weight 5 lbs    Reps 10-15    Time 10 Minutes      NuStep   Level 5    SPM 145    Minutes 17    METs 5.2      Recumbant Elliptical   Level 5    RPM 79    Minutes 22    METs 4.1           Nutrition:  Target Goals: Understanding of nutrition guidelines, daily intake of sodium 1500mg , cholesterol 200mg , calories 30% from fat and 7% or less from saturated fats, daily to have 5 or more servings of fruits and vegetables.  Biometrics:  Pre Biometrics - 01/23/20 1452      Pre Biometrics   Height 5\' 11"  (1.803 m)    Weight 123.5 kg    Waist Circumference 49 inches    Hip Circumference 43 inches    Waist to Hip Ratio 1.14 %    BMI (Calculated) 37.99    Triceps Skinfold 19 mm    % Body Fat 35.7 %    Grip Strength 32.5 kg    Flexibility 0 in    Single Leg Stand 4.58 seconds            Nutrition Therapy Plan and Nutrition Goals:  Nutrition Therapy & Goals - 04/03/20 1538      Personal Nutrition Goals   Comments Patient continues to say he is trying to cut back on his sweets and is trying to make healthier choices working with his wife. We continue to provide education with handouts.  Will continue to monitor.      Intervention Plan   Intervention Nutrition handout(s) given to patient.           Nutrition Assessments:  Nutrition Assessments - 01/23/20 1440      MEDFICTS Scores   Pre Score 123           Nutrition Goals Re-Evaluation:   Nutrition Goals Discharge (Final Nutrition Goals Re-Evaluation):   Psychosocial: Target Goals: Acknowledge presence or absence of significant depression and/or stress, maximize coping skills, provide positive support system. Participant is able to verbalize types and ability to use techniques and skills needed for reducing stress and depression.  Initial Review & Psychosocial Screening:  Initial Psych Review & Screening - 01/23/20 1444  Initial Review   Current issues with None Identified      Family Dynamics    Good Support System? Yes    Comments Patient has no psychosocial issues identified at his orientation visit. He states he has very good family support and is very excited to start the program and get back to work. He does have prostate cancer which he is currently being treated for but has a positive outlook. Will continue to monitor.      Barriers   Psychosocial barriers to participate in program There are no identifiable barriers or psychosocial needs.      Screening Interventions   Interventions Encouraged to exercise    Expected Outcomes Short Term goal: Identification and review with participant of any Quality of Life or Depression concerns found by scoring the questionnaire.;Long Term goal: The participant improves quality of Life and PHQ9 Scores as seen by post scores and/or verbalization of changes           Quality of Life Scores:  Quality of Life - 01/23/20 1454      Quality of Life   Select Quality of Life      Quality of Life Scores   Health/Function Pre 21 %    Socioeconomic Pre 24.88 %    Psych/Spiritual Pre 23.57 %    Family Pre 28.8 %    GLOBAL Pre 23.51 %          Scores of 19 and below usually indicate a poorer quality of life in these areas.  A difference of  2-3 points is a clinically meaningful difference.  A difference of 2-3 points in the total score of the Quality of Life Index has been associated with significant improvement in overall quality of life, self-image, physical symptoms, and general health in studies assessing change in quality of life.  PHQ-9: Recent Review Flowsheet Data    Depression screen [facility] 2/9 01/23/2020   Decreased Interest 0   Down, Depressed, Hopeless 0   PHQ - 2 Score 0   Altered sleeping 0   Tired, decreased energy 1   Change in appetite 0   Feeling bad or failure about yourself  0   Trouble concentrating 0   Moving slowly or fidgety/restless 0   Suicidal thoughts 0   PHQ-9 Score 1     Interpretation of Total Score   Total Score Depression Severity:  1-4 = Minimal depression, 5-9 = Mild depression, 10-14 = Moderate depression, 15-19 = Moderately severe depression, 20-27 = Severe depression   Psychosocial Evaluation and Intervention:  Psychosocial Evaluation - 01/23/20 1504      Psychosocial Evaluation & Interventions   Interventions Encouraged to exercise with the program and follow exercise prescription;Stress management education;Relaxation education    Comments Patient has no psychosocial issues identified at his orientation visit. His initial QOL was 27.58% and his PHQ-9 was 1. Will continue to monitor.    Expected Outcomes Patient will have no psychosocial issues identified at discharge.    Continue Psychosocial Services  No Follow up required           Psychosocial Re-Evaluation:  Psychosocial Re-Evaluation    [place] Name 02/11/20 1254 03/10/20 1334 04/03/20 1538         Psychosocial Re-Evaluation   Current issues with None Identified -- None Identified     Comments Patient's initial QOL score was 23.51 and his PHQ-9 score was 1. He continue to have no psychosocial issues identified. Will continue to monitor. Patient continues to  have no psychosocial issues identified. Continues to have positive outlook.  Will continue to monitor for progress. Patient continues to have no psychosocial issues identified. Continues to have positive outlook.  Will continue to monitor for progress.     Expected Outcomes Patient will have no psychosocial issues identified at discharge. Patient will have no psychosocial issues identified at discharge. Patient will have no psychosocial issues identified at discharge.     Interventions Stress management education;Encouraged to attend Cardiac Rehabilitation for the exercise;Relaxation education Stress management education;Encouraged to attend Cardiac Rehabilitation for the exercise;Relaxation education Stress management education;Encouraged to attend Cardiac Rehabilitation  for the exercise;Relaxation education     Continue Psychosocial Services  No Follow up required No Follow up required No Follow up required            Psychosocial Discharge (Final Psychosocial Re-Evaluation):  Psychosocial Re-Evaluation - 04/03/20 1538      Psychosocial Re-Evaluation   Current issues with None Identified    Comments Patient continues to have no psychosocial issues identified. Continues to have positive outlook.  Will continue to monitor for progress.    Expected Outcomes Patient will have no psychosocial issues identified at discharge.    Interventions Stress management education;Encouraged to attend Cardiac Rehabilitation for the exercise;Relaxation education    Continue Psychosocial Services  No Follow up required           Vocational Rehabilitation: Provide vocational rehab assistance to qualifying candidates.   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 He does not need vocational rehab.           Education: Education Goals: Education classes will be provided on a weekly basis, covering required topics. Participant will state understanding/return demonstration of topics presented.  Learning Barriers/Preferences:  Learning Barriers/Preferences - 01/23/20 1440      Learning Barriers/Preferences   Learning Barriers None    Learning Preferences Audio;Pictoral           Education Topics: Hypertension, Hypertension Reduction -Define heart disease and high blood pressure. Discus how high blood pressure affects the body and ways to reduce high blood pressure.   CARDIAC REHAB PHASE II EXERCISE from 04/04/2020 in [place]  Date 03/19/20  Educator DF  Instruction  Review Code 2- Demonstrated Understanding      Exercise and Your Heart -Discuss why it is important to exercise, the FITT principles of exercise, normal and abnormal responses to exercise, and how to exercise safely.   CARDIAC REHAB PHASE II EXERCISE from 04/04/2020 in [place]  Date 03/26/20  Educator DF  Instruction Review Code 2- Demonstrated Understanding      Angina -Discuss definition of angina, causes of angina, treatment of angina, and how to decrease risk of having angina.   CARDIAC REHAB PHASE II EXERCISE from 04/04/2020 in [place]  Date 04/04/20  Educator DF  [DF]  Instruction Review Code 2- Demonstrated Understanding      Cardiac Medications -Review what the following cardiac medications are used for, how they affect the body, and side effects that may occur when taking the medications.  Medications include Aspirin, Beta blockers, calcium channel blockers, ACE Inhibitors, angiotensin receptor blockers, diuretics, digoxin, and antihyperlipidemics.  Congestive Heart Failure -Discuss the definition of CHF, how to live with CHF, the signs and symptoms of CHF, and how keep track of weight and sodium intake.   Heart Disease and Intimacy -Discus the effect sexual activity has on the heart, how changes occur during intimacy as we age, and safety during sexual activity.   Smoking Cessation / COPD -Discuss different methods to quit smoking, the health benefits of quitting smoking, and the definition of COPD.   CARDIAC REHAB PHASE II EXERCISE from 04/04/2020 in [place]  Date 01/30/20  Educator DF  Instruction Review Code 2- Demonstrated Understanding      Nutrition I: Fats -Discuss the types of cholesterol, what cholesterol does to the heart, and how cholesterol levels can be controlled.   Nutrition II: Labels -Discuss the different components of food labels and how to read food label    CARDIAC REHAB PHASE II EXERCISE from 04/04/2020 in [place]  Date 02/13/20  Educator DF  Instruction Review Code 1- Verbalizes Understanding      Heart Parts/Heart Disease and PAD -Discuss the anatomy of the heart, the pathway of blood circulation through the heart, and these are affected by heart disease.   Stress I: Signs and Symptoms -Discuss the causes of stress, how stress may lead to anxiety and depression, and ways to limit stress.   CARDIAC REHAB PHASE II EXERCISE from 04/04/2020 in [place]  Date 02/27/20  Educator DF  Instruction Review Code 2- Demonstrated Understanding      Stress II: Relaxation -Discuss different types of relaxation techniques to limit stress.   Warning Signs of Stroke / TIA -Discuss definition of a stroke, what the signs and symptoms are of a stroke, and how to identify when someone is having stroke.   CARDIAC REHAB PHASE II EXERCISE from 04/04/2020 in [place]  Date 03/12/20  Educator DJ  Instruction Review Code 1- Verbalizes Understanding      Knowledge Questionnaire Score:  Knowledge Questionnaire Score - 01/23/20 1441      Knowledge Questionnaire Score   Pre Score 21/24           Core Components/Risk Factors/Patient Goals at Admission:  Personal Goals and Risk Factors at Admission - 01/23/20 1441      Core Components/Risk Factors/Patient Goals on Admission    Weight Management Obesity    Diabetes Yes    Intervention Provide education about signs/symptoms and action to take for hypo/hyperglycemia.;Provide education about proper nutrition, including hydration, and aerobic/resistive exercise prescription along with prescribed medications to achieve blood glucose in normal ranges: Fasting glucose 65-99 mg/dL    Expected Outcomes Short Term: Participant verbalizes understanding of the signs/symptoms and immediate care of hyper/hypoglycemia, proper foot care and  importance of medication, aerobic/resistive exercise and nutrition plan for blood glucose control.    Personal Goal Other Yes    Personal Goal Get in better shape. Be able to return to work.    Intervention Patient will attend CR 3 days/week and supplement with exercise at home 2 days/week.    Expected Outcomes Patient will meet both program and personal goals.           Core Components/Risk Factors/Patient Goals Review:   Goals and Risk Factor Review    Row Name 02/11/20 1256 03/10/20 1330 04/03/20 1538         Core Components/Risk Factors/Patient Goals Review   Personal Goals Review Weight Management/Obesity;Diabetes;Other  Get in shape; get back to  work. [employer]/Obesity;Diabetes;Other  Get in shape; get back to work. Weight Management/Obesity;Diabetes;Other  Get in shape; get back to work.     Review Patient has completed 7 sessions losing 8 lbs since his intial visit. He is doing well in the program with consistent attendance and progression. He says he is feeling stronger and has more energy. 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 He says he is feeling like he is working toward getting in shape and getting his heart stronger. Will continue to monitor for progress. Patient has completed 18 sessions losing 3 lbs since last 30 day review. He continues to do well in the program with progressions and consistent attendance. He continues to be followed by oncologist with treatment of prostate cance with bone metatasis. He says he continues to feel stronger and has more energy and feels like he is getting in shape. 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 Will continue to monitor for progress. Patient has completed 26 sessions and will graduate soon. He continues to do well in the program with  progressions and consistent attendance. He continues to be followed by oncology with treatment of prostate cancer with bone metatasis. He  continues to report improvement in his energy and strength. He is ready to return to work and plans to do so when he finishes the program. Will continue to monitor.     Expected Outcomes Patient will complete the program meeting both program and personal goals. Patient will complete the program meeting both program and personal goals. Patient will complete the program meeting both program and personal goals.            Core Components/Risk Factors/Patient Goals at Discharge (Final Review):   Goals and Risk Factor Review - 04/03/20 1538      Core Components/Risk Factors/Patient Goals Review   Personal Goals Review Weight Management/Obesity;Diabetes;Other   Get in shape; get back to work.   Review Patient has completed 26 sessions and will graduate soon. He continues to do well in the program with  progressions and consistent attendance. He continues to be followed by oncology with treatment of prostate cancer with bone metatasis. He continues to report improvement in his energy and strength. He is ready to return to work and plans to do so when he finishes the program. Will continue to monitor.    Expected Outcomes Patient will complete the program meeting both program and personal goals.           ITP Comments:   Comments: ITP REVIEW Pt is making expected progress toward Cardiac Rehab goals after completing 26 sessions. Recommend continued exercise, life style modification, education, and increased stamina and strength.

## 2020-04-09 NOTE — Progress Notes (Signed)
Daily Session Note  Patient Details  Name: Hunter Jones MRN: [id number] Date of Birth: 1955/06/27 Referring Provider:     CARDIAC REHAB PHASE II ORIENTATION from 01/23/2020 in [place]  Referring Provider Domenic Polite      Encounter Date: 04/09/2020  Check In:  Session Check In - 04/09/20 0942      Check-In   Supervising physician immediately available to respond to emergencies [facility] MD immediately available    Physician(s) Dr. Harl Bowie    Location [facility]    Staff Present Hoy Register, MS, ACSM-CEP, Exercise Physiologist;Debra Wynetta Emery, RN, BSN    Virtual Visit No    Medication changes reported     No    Fall or balance concerns reported    No    Tobacco Cessation No Change    Warm-up and Cool-down Performed as group-led instruction    Resistance Training Performed Yes    VAD Patient? No    PAD/SET Patient? No      Pain Assessment   Currently in Pain? No/denies    Pain Score 0-No pain    Multiple Pain Sites No           Capillary Blood Glucose: No results found for this or any previous visit (from the past 24 hour(s)).    Social History   Tobacco Use  Smoking Status Former Smoker  . Years: 40.00  . Types: Cigarettes  . Quit date: 06/21/2012  . Years since quitting: 7.8  Smokeless Tobacco Never Used    Goals Met:  Independence with exercise equipment Exercise tolerated well No report of cardiac concerns or symptoms Strength training completed today  Goals Unmet:  Not Applicable  Comments: checkout time is 1030   Dr. Kathie Dike is Medical Director for [facility].

## 2020-04-10 ENCOUNTER — Inpatient Hospital Stay (HOSPITAL_BASED_OUTPATIENT_CLINIC_OR_DEPARTMENT_OTHER): Payer: Medicare Other | Admitting: Hematology

## 2020-04-10 ENCOUNTER — Other Ambulatory Visit (HOSPITAL_COMMUNITY): Payer: Medicare Other

## 2020-04-10 ENCOUNTER — Other Ambulatory Visit: Payer: Self-pay

## 2020-04-10 ENCOUNTER — Inpatient Hospital Stay (HOSPITAL_COMMUNITY): Payer: Medicare Other

## 2020-04-10 ENCOUNTER — Other Ambulatory Visit (HOSPITAL_COMMUNITY): Payer: Self-pay | Admitting: *Deleted

## 2020-04-10 VITALS — BP 120/51 | HR 71 | Temp 97.1°F | Resp 18 | Wt 264.0 lb

## 2020-04-10 DIAGNOSIS — C61 Malignant neoplasm of prostate: Secondary | ICD-10-CM

## 2020-04-10 DIAGNOSIS — C7951 Secondary malignant neoplasm of bone: Secondary | ICD-10-CM | POA: Diagnosis not present

## 2020-04-10 MED ORDER — DENOSUMAB 120 MG/1.7ML ~~LOC~~ SOLN
120.0000 mg | Freq: Once | SUBCUTANEOUS | Status: AC
  Administered 2020-04-10: 120 mg via SUBCUTANEOUS

## 2020-04-10 MED ORDER — BICALUTAMIDE 50 MG PO TABS
50.0000 mg | ORAL_TABLET | Freq: Every day | ORAL | 2 refills | Status: DC
Start: 2020-04-10 — End: 2020-05-12

## 2020-04-10 MED ORDER — DENOSUMAB 120 MG/1.7ML ~~LOC~~ SOLN
SUBCUTANEOUS | Status: AC
  Filled 2020-04-10: qty 1.7

## 2020-04-10 NOTE — Progress Notes (Signed)
[place] reviewed with and pt seen by Dr. Delton Coombes and pt approved for Xgeva injection today per MD      Phillips Climes tolerated Xgeva injection well without complaints or incident. Calcium 9.4 today and pt denied any tooth or jaw pain and no recent or future dental visits prior to administering this medication. VSS Pt continues to take his Calcium PO as prescribed without issues. Pt discharged self ambulatory in satisfactory condition

## 2020-04-10 NOTE — Patient Instructions (Signed)
[facility] Discharge Instructions  Received Xgeva injection today. Follow-up as scheduled   Thank you for choosing [facility] at [facility] to provide your oncology and hematology care.  To afford each patient quality time with our provider, please arrive at least 15 minutes before your scheduled appointment time.   If you have a lab appointment with the Cancer Center please come in thru the Main Entrance and check in at the main information desk.  You need to re-schedule your appointment should you arrive 10 or more minutes late.  We strive to give you quality time with our providers, and arriving late affects you and other patients whose appointments are after yours.  Also, if you no show three or more times for appointments you may be dismissed from the clinic at the providers discretion.     Again, thank you for choosing [facility].  Our hope is that these requests will decrease the amount of time that you wait before being seen by our physicians.       _____________________________________________________________  Should you have questions after your visit to [facility], please contact our office at [phone] and follow the prompts.  Our office hours are 8:00 a.m. and 4:30 p.m. Monday - Friday.  Please note that voicemails left after 4:00 p.m. may not be returned until the following business day.  We are closed weekends and major holidays.  You do have access to a nurse 24-7, just call the main number to the clinic [phone] and do not press any options, hold on the line and a nurse will answer the phone.    For prescription refill requests, have your pharmacy contact our office and allow 72 hours.    Due to Covid, you will need to wear a mask upon entering the hospital. If you do not have a mask, a mask will be given to you at the Main Entrance upon arrival. For doctor visits, patients may have 1  support person age 18 or older with them. For treatment visits, patients can not have anyone with them due to social distancing guidelines and our immunocompromised population.     

## 2020-04-10 NOTE — Progress Notes (Signed)
Patient was assessed by Dr. Katragadda and labs have been reviewed.  Patient is okay to proceed with treatment today. Primary RN and pharmacy aware.   

## 2020-04-10 NOTE — Progress Notes (Signed)
[place], [place] [address]   CLINIC:  Medical Oncology/Hematology  PCP:  Monico Blitz, MD [address] [place] [address] [phone]   REASON FOR VISIT:  Follow-up for prostate Jones  PRIOR THERAPY:  1. Radical resection of prostate on 06/27/2014. 2. Zytiga from 05/19/2018 to 11/09/2019.  NGS Results: Guardant AR amplification, TMB 16 Muts/Mb  CURRENT THERAPY: Lupron every 3 months  BRIEF ONCOLOGIC HISTORY:  Oncology History  Prostate Jones ([place])  02/06/2014 Procedure   Prostate biopsy by Dr. 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  Total Gleason score equals 7.  Proportion of prostatic tissue involved by tumor: 70%.   05/03/2014 Imaging   Bone scan- Negative    06/27/2014 Procedure   Radical resection of prostate by Dr. Raynelle Jones   07/29/2014 Pathology Results   1. Prostate, radical resection - PROSTATIC ADENOCARCINOMA, GLEASON SCORE 4 + 3 = 7. - RIGHT AND LEFT PROSTATE INVOLVED. - RIGHT AND LEFT SEMINAL VESICLES INVOLVED BY TUMOR. - EXTRACAPSULAR EXTENSION BY TUMOR. - TUMOR EXTENDS INTO BLADDER NECK TISSUE. - MARGINS NOT INVOLVED. 2. Lymph nodes, regional resection, left pelvic - THREE BENIGN LYMPH NODES (0/3). 3. Lymph nodes, regional resection, right pelvic - FOUR BENIGN LYMPH NODES (0/4).   11/16/2016 Imaging   Bone scan- New areas of increased uptake superolateral to the left orbit (faint), at S1, and in the posterolateral aspect of the left sixth rib.    11/25/2016 Imaging   CT CAP- 1. Status post radical prostatectomy. No findings to suggest local recurrence of disease or definite extraskeletal metastatic disease in the chest, abdomen or pelvis. However, there are several osseous lesions, as above, concerning for metastatic disease to the bones. 2. Hepatic  steatosis. 3. Aortic atherosclerosis, in addition to 2 vessel coronary artery disease. Please note that although the presence of coronary artery calcium documents the presence of coronary artery disease, the severity of this disease and any potential stenosis cannot be assessed on this non-gated CT examination. Assessment for potential risk factor modification, dietary therapy or pharmacologic therapy may be warranted, if clinically indicated. 4. Additional incidental findings, as above.    Genetic Testing   Guardant 360 Results:           Jones STAGING: Jones Staging Prostate Jones [facility]) Staging form: Prostate, AJCC 7th Edition - Pathologic stage from 07/29/2014: Stage III (T3b, N0, cM0, Gleason 7) - Signed by Hunter Cancer, PA-C on 11/09/2016 - Clinical: Stage IV (T3, N0, M1, PSA: Less than 10, Gleason 7) - Signed by Hunter First, MD on 12/21/2016 - Pathologic: No stage assigned - Unsigned   INTERVAL HISTORY:  Hunter Jones, a 64 y.o. male, returns for routine follow-up of his prostate Jones. Hunter Jones was last seen on 02/14/2020.   Today he reports feeling good. He is not returning to work until his cardiologist clears him and he started his cardiac rehab since 9/3 which he is tolerating well. He denies excessive fatigue or hot flashes, though he gets SOB with exertion. He denies having CP.  He received his COVID booster on 10/14.   REVIEW OF SYSTEMS:  Review of Systems  Constitutional: Negative for appetite change and fatigue.  Respiratory: Positive for shortness of breath (d/t COPD).   Cardiovascular: Negative for chest pain.  Endocrine: Negative for hot flashes.  Musculoskeletal: Positive for  back pain (6/10 back pain).  Neurological: Positive for dizziness (upon bending).  All other systems reviewed and are negative.   PAST MEDICAL/SURGICAL HISTORY:  Past Medical History:  Diagnosis Date  . Acute ST elevation myocardial infarction (STEMI) of inferior  wall ([place]) 11/2019  . Asthma   . COPD (chronic obstructive pulmonary disease) ([place])   . Coronary artery disease    DES to mid LAD August 2014 ([facility]); overlapping DESx2 prox-mid RCA 11/2019  . Essential hypertension   . Hyperthyroidism   . NSTEMI (non-ST elevated myocardial infarction) ([place]) 2014  . Prostate Jones ([place])   . Type 2 diabetes mellitus ([place])    Past Surgical History:  Procedure Laterality Date  . APPENDECTOMY    . CORONARY/GRAFT ACUTE MI REVASCULARIZATION N/A 12/10/2019   Procedure: Coronary/Graft Acute MI Revascularization;  Surgeon: Hunter Bush, MD;  Location: [facility];  Service: Cardiovascular;  Laterality: N/A;  . LEFT HEART CATH AND CORONARY ANGIOGRAPHY N/A 12/10/2019   Procedure: LEFT HEART CATH AND CORONARY ANGIOGRAPHY;  Surgeon: Hunter Bush, MD;  Location: [place] [facility];  Service: Cardiovascular;  Laterality: N/A;  . LYMPHADENECTOMY Bilateral 06/27/2014   Procedure: LYMPHADENECTOMY;  Surgeon: Hunter Bring, MD;  Location: [facility];  Service: Urology;  Laterality: Bilateral;  . ROBOT ASSISTED LAPAROSCOPIC RADICAL PROSTATECTOMY N/A 06/27/2014   Procedure: ROBOTIC ASSISTED LAPAROSCOPIC RADICAL PROSTATECTOMY LEVEL 3;  Surgeon: Hunter Bring, MD;  Location: [facility];  Service: Urology;  Laterality: N/A;  . TONSILLECTOMY      SOCIAL HISTORY:  Social History   Socioeconomic History  . Marital status: Married    Spouse name: Not on file  . Number of children: Not on file  . Years of education: Not on file  . Highest education level: Not on file  Occupational History  . Not on file  Tobacco Use  . Smoking status: Former Smoker    Years: 40.00    Types: Cigarettes    Quit date: 06/21/2012    Years since quitting: 7.8  . Smokeless tobacco: Never Used  Vaping Use  . Vaping Use: Never used  Substance and Sexual Activity  . Alcohol use: Yes    Comment: occasional  . Drug use: No  . Sexual activity: Not on file  Other Topics Concern  . Not on  file  Social History Narrative  . Not on file   Social Determinants of Health   Financial Resource Strain:   . Difficulty of Paying Living Expenses: Not on file  Food Insecurity:   . Worried About Charity fundraiser in the Last Year: Not on file  . Ran Out of Food in the Last Year: Not on file  Transportation Needs:   . Lack of Transportation (Medical): Not on file  . Lack of Transportation (Non-Medical): Not on file  Physical Activity:   . Days of Exercise per Week: Not on file  . Minutes of Exercise per Session: Not on file  Stress:   . Feeling of Stress : Not on file  Social Connections:   . Frequency of Communication with Friends and Family: Not on file  . Frequency of Social Gatherings with Friends and Family: Not on file  . Attends Religious Services: Not on file  . Active Member of Clubs or Organizations: Not on file  . Attends Archivist Meetings: Not on file  . Marital Status: Not on file  Intimate Partner Violence:   . Fear of Current or Ex-Partner: Not on file  .  Emotionally Abused: Not on file  . Physically Abused: Not on file  . Sexually Abused: Not on file    FAMILY HISTORY:  Family History  Problem Relation Age of Onset  . Dementia Mother   . Thyroid disease Mother   . Clotting disorder Mother   . Kidney Stones Father   . Stroke Brother   . Alzheimer's disease Maternal Uncle   . Tuberculosis Paternal Grandmother   . Heart attack Maternal Uncle        x4  . Heart attack Cousin        in his 68s    CURRENT MEDICATIONS:  Current Outpatient Medications  Medication Sig Dispense Refill  . albuterol (PROVENTIL HFA;VENTOLIN HFA) 108 (90 BASE) MCG/ACT inhaler Inhale 1-2 puffs into the lungs every 6 (six) hours as needed for wheezing or shortness of breath.     [name] Kitchen aspirin EC 81 MG tablet Take 81 mg by mouth daily.     [name] Kitchen atorvastatin (LIPITOR) 40 MG tablet TAKE 1 TABLET BY MOUTH AT BEDTIME. (Patient taking differently: Take 40 mg by mouth at  bedtime. ) 30 tablet 6  . Calcium-Magnesium-Vitamin D (CALCIUM 500 PO) Take 2 tablets by mouth daily with breakfast. Per pt he takes 600mg  BID    . Co-Enzyme Q-10 100 MG CAPS Take 1 capsule by mouth daily.    [name] Kitchen denosumab (XGEVA) 120 MG/1.7ML SOLN injection Inject 120 mg into the skin every 30 (thirty) days.    . fluticasone-salmeterol (ADVAIR HFA) 115-21 MCG/ACT inhaler Inhale 2 puffs into the lungs 2 (two) times daily.     [name] Kitchen KRILL OIL PO Take 1 capsule by mouth daily.     [name] Kitchen Leuprolide Acetate, 3 Month, (LUPRON DEPOT, 22-MONTH, IM) Inject into the muscle every 3 (three) months.    . methimazole (TAPAZOLE) 10 MG tablet Take 10 mg by mouth every other day.     . metoprolol succinate (TOPROL XL) 25 MG 24 hr tablet Take 1 tablet (25 mg total) by mouth daily. 90 tablet 1  . Multiple Vitamin (MULTIVITAMIN) tablet Take 1 tablet by mouth daily.    . Potassium 99 MG TABS Take 1 tablet by mouth daily.    . ticagrelor (BRILINTA) 90 MG TABS tablet Take 1 tablet (90 mg total) by mouth 2 (two) times daily. 60 tablet 11  . bicalutamide (CASODEX) 50 MG tablet Take 1 tablet (50 mg total) by mouth daily. 30 tablet 2  . nitroGLYCERIN (NITROSTAT) 0.4 MG SL tablet Place 1 tablet (0.4 mg total) under the tongue every 5 (five) minutes as needed. (Patient not taking: Reported on 04/10/2020) 25 tablet 12  . triamcinolone cream (KENALOG) 0.1 % Apply 1 application topically daily as needed (For lg rash).  (Patient not taking: Reported on 04/10/2020)     No current facility-administered medications for this visit.   Facility-Administered Medications Ordered in Other Visits  Medication Dose Route Frequency Provider Last Rate Last Admin  . denosumab (XGEVA) injection 120 mg  120 mg Subcutaneous Once Derek Jack, MD        ALLERGIES:  Allergies  Allergen Reactions  . Xtandi [Enzalutamide] Other (See Comments)    "Severe personality change"   . Penicillins Hives    PHYSICAL EXAM:  Performance status (ECOG):  1 - Symptomatic but completely ambulatory  Vitals:   04/10/20 1100  BP: (!) 120/51  Pulse: 71  Resp: 18  Temp: (!) 97.1 F (36.2 C)  SpO2: 98%   Wt Readings from Last 3 Encounters:  04/10/20 264 lb (119.7 kg)  04/07/20 262 lb 5.6 oz (119 kg)  03/31/20 260 lb 12.9 oz (118.3 kg)   Physical Exam Vitals reviewed.  Constitutional:      Appearance: Normal appearance. He is obese.  Neurological:     General: No focal deficit present.     Mental Status: He is alert and oriented to person, place, and time.  Psychiatric:        Mood and Affect: Mood normal.        Behavior: Behavior normal.      LABORATORY DATA:  I have reviewed the labs as listed.  CBC Latest Ref Rng & Units 04/09/2020 03/13/2020 02/13/2020  WBC 4.0 - 10.5 K/uL 7.0 6.8 6.4  Hemoglobin 13.0 - 17.0 g/dL 13.6 13.5 13.8  Hematocrit 39 - 52 % 40.1 40.0 42.0  Platelets 150 - 400 K/uL 149(L) 150 137(L)   CMP Latest Ref Rng & Units 04/09/2020 03/13/2020 02/13/2020  Glucose 70 - 99 mg/dL 103(H) 133(H) 93  BUN 8 - 23 mg/dL 24(H) 31(H) 28(H)  Creatinine 0.61 - 1.24 mg/dL 1.22 1.34(H) 1.25(H)  Sodium 135 - 145 mmol/L 137 139 141  Potassium 3.5 - 5.1 mmol/L 5.1 4.3 4.8  Chloride 98 - 111 mmol/L 105 107 107  CO2 22 - 32 mmol/L $RemoveB'25 24 24  'tdrrCaPQ$ Calcium 8.9 - 10.3 mg/dL 9.4 9.1 9.4  Total Protein 6.5 - 8.1 g/dL 6.5 6.3(L) 6.6  Total Bilirubin 0.3 - 1.2 mg/dL 0.7 0.6 0.5  Alkaline Phos 38 - 126 U/L 86 83 79  AST 15 - 41 U/L $Remo'23 28 24  'xZWnE$ ALT 0 - 44 U/L $Remo'24 28 28   'xTIgP$ Lab Results  Component Value Date   PSA 4.47 (H) 04/09/2020   PSA 1.66 02/13/2020   PSA 1.42 12/19/2019    DIAGNOSTIC IMAGING:  I have independently reviewed the scans and discussed with the patient. No results found.   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 -Abiraterone 750 mg and prednisone 5 mg started on 06/16/2018. -CTAP and bone scan on 05/23/2019 showed improved response compared  to prior scans. -Abiraterone dose increased to 1000 mg daily in the Jones week of January 2021 as PSA was going up. -CT AP on 11/06/2019 showed unchanged appearance of diffuse sclerotic bone metastasis. No visceral mets. -Bone scan on 11/06/2019 shows sclerotic metastatic disease to the sacrum. Similar to slightly increased activity compared to the previous study could be due to technical factors. No new areas. -Xtandi started around 11/21/2019, discontinued on 12/05/2019 due to mood changes. -ST elevation MI involving the right coronary artery on 12/10/2019, status post cardiac catheterization showing thrombotic occlusion of the proximal RCA, successful PCI with stent placement. -He is on aspirin and Brilinta. -Invitae testing was negative. -Guardant 360 showed AR amplification.  No other mutations. -PSA increased to 4.47 on 04/09/2020. -Bicalutamide 50 mg daily started on 04/02/2020.   PLAN:  1. 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 -His PSA continues to trend up at 4.47 today.  Calcium and LFTs are normal. -He is doing cardiac rehab which will be completed in the next 10 days. -He had adverse reaction to enzalutamide.  He progressed on abiraterone.  I have recommended starting him on bicalutamide 50 mg daily and see if it helps. -We discussed side effects of bicalutamide in detail. -We have sent a prescription for it.  Will check labs in 4  weeks to see how he is tolerating. -He also reported intermittent erections.  We'll check it testosterone level at next visit.  2. Bone metastasis: -Continue monthly denosumab.  Calcium is normal at 9.4.  Continue calcium and vitamin D supplements.  3.Elevated blood sugars: -Continue Jardiance.  Sugar is 103.  4. CADand stents: -Continue aspirin and Brilinta.   Orders placed this encounter:  Orders Placed This Encounter  Procedures  . Testosterone     Derek Jack, MD [place] [phone]   I, Milinda Antis, am acting as a scribe for Dr. Sanda Linger.  I, Derek Jack MD, have reviewed the above documentation for accuracy and completeness, and I agree with the above.

## 2020-04-10 NOTE — Patient Instructions (Signed)
[place] at [facility] Discharge Instructions  You were seen today by Dr. Delton Coombes. He went over your recent results. You received your Delton See today. You will be prescribed bicalutamide to take daily; the side effects to expect are excessive tiredness and hot flashes. Dr. Delton Coombes will see you back in 1 month for labs and follow up.   Thank you for choosing [place] at [facility] to provide your oncology and hematology care.  To afford each patient quality time with our provider, please arrive at least 15 minutes before your scheduled appointment time.   If you have a lab appointment with the [facility] please come in thru the Main Entrance and check in at the main information desk  You need to re-schedule your appointment should you arrive 10 or more minutes late.  We strive to give you quality time with our providers, and arriving late affects you and other patients whose appointments are after yours.  Also, if you no show three or more times for appointments you may be dismissed from the clinic at the providers discretion.     Again, thank you for choosing [facility].  Our hope is that these requests will decrease the amount of time that you wait before being seen by our physicians.       _____________________________________________________________  Should you have questions after your visit to [facility], please contact our office at [phone] between the hours of 8:00 a.m. and 4:30 p.m.  Voicemails left after 4:00 p.m. will not be returned until the following business day.  For prescription refill requests, have your pharmacy contact our office and allow 72 hours.    Cancer Center Support Programs:   > Cancer Support Group  2nd Tuesday of the month 1pm-2pm, Journey Room

## 2020-04-11 ENCOUNTER — Encounter (HOSPITAL_COMMUNITY)
Admission: RE | Admit: 2020-04-11 | Discharge: 2020-04-11 | Disposition: A | Discharge status: Home / Self Care | Payer: Medicare Other | Source: Ambulatory Visit | Attending: Cardiology | Admitting: Cardiology

## 2020-04-11 DIAGNOSIS — I213 ST elevation (STEMI) myocardial infarction of unspecified site: Secondary | ICD-10-CM | POA: Diagnosis not present

## 2020-04-11 DIAGNOSIS — Z955 Presence of coronary angioplasty implant and graft: Secondary | ICD-10-CM

## 2020-04-11 MED FILL — BICALUTAMIDE 50 MG TABS: 50 | 30 days supply | Qty: 30 | Fill #0

## 2020-04-11 NOTE — Progress Notes (Signed)
Daily Session Note  Patient Details  Name: PRATT BRESS MRN: [id number] Date of Birth: 03/11/1956 Referring Provider:     [place] from 01/23/2020 in [place]  Referring Provider Domenic Polite      Encounter Date: 04/11/2020  Check In:  Session Check In - 04/11/20 7290      Check-In   Supervising physician immediately available to respond to emergencies [facility] MD immediately available    Physician(s) Dr. Harl Bowie    Location [facility]    Staff Present Hoy Register, MS, ACSM-CEP, Exercise Physiologist;Debra Wynetta Emery, RN, BSN    Virtual Visit No    Medication changes reported     No    Fall or balance concerns reported    No    Tobacco Cessation No Change    Warm-up and Cool-down Performed as group-led instruction    Resistance Training Performed Yes    VAD Patient? No    PAD/SET Patient? No      Pain Assessment   Currently in Pain? No/denies    Pain Score 0-No pain    Multiple Pain Sites No           Capillary Blood Glucose: No results found for this or any previous visit (from the past 24 hour(s)).    Social History   Tobacco Use  Smoking Status Former Smoker  . Years: 40.00  . Types: Cigarettes  . Quit date: 06/21/2012  . Years since quitting: 7.8  Smokeless Tobacco Never Used    Goals Met:  Independence with exercise equipment Exercise tolerated well No report of cardiac concerns or symptoms Strength training completed today  Goals Unmet:  Not Applicable  Comments: checkout time is 1030   Dr. Kathie Dike is Medical Director for [facility].

## 2020-04-14 ENCOUNTER — Other Ambulatory Visit: Payer: Self-pay

## 2020-04-14 ENCOUNTER — Encounter (HOSPITAL_COMMUNITY)
Admission: RE | Admit: 2020-04-14 | Discharge: 2020-04-14 | Disposition: A | Discharge status: Home / Self Care | Payer: Medicare Other | Source: Ambulatory Visit | Attending: Cardiology | Admitting: Cardiology

## 2020-04-14 DIAGNOSIS — I213 ST elevation (STEMI) myocardial infarction of unspecified site: Secondary | ICD-10-CM | POA: Diagnosis present

## 2020-04-14 DIAGNOSIS — Z955 Presence of coronary angioplasty implant and graft: Secondary | ICD-10-CM | POA: Diagnosis present

## 2020-04-14 NOTE — Progress Notes (Signed)
Daily Session Note  Patient Details  Name: Hunter Jones MRN: [id number] Date of Birth: Mar 13, 1956 Referring Provider:     [place] from 01/23/2020 in [place]  Referring Provider Domenic Polite      Encounter Date: 04/14/2020  Check In:  Session Check In - 04/14/20 0943      Check-In   Supervising physician immediately available to respond to emergencies [facility] MD immediately available    Physician(s) Dr. Harl Bowie    Location [facility]    Staff Present Hoy Register, MS, ACSM-CEP, Exercise Physiologist;Debra Wynetta Emery, RN, BSN    Virtual Visit No    Medication changes reported     Yes    Comments began taking bicalutamide 50 mg once per day    Fall or balance concerns reported    No    Tobacco Cessation No Change    Warm-up and Cool-down Performed as group-led instruction    Resistance Training Performed Yes    VAD Patient? No    PAD/SET Patient? No      Pain Assessment   Currently in Pain? No/denies    Pain Score 0-No pain    Multiple Pain Sites No           Capillary Blood Glucose: No results found for this or any previous visit (from the past 24 hour(s)).    Social History   Tobacco Use  Smoking Status Former Smoker  . Years: 40.00  . Types: Cigarettes  . Quit date: 06/21/2012  . Years since quitting: 7.8  Smokeless Tobacco Never Used    Goals Met:  Independence with exercise equipment Exercise tolerated well No report of cardiac concerns or symptoms Strength training completed today  Goals Unmet:  Not Applicable  Comments: checkout time is 1030   Dr. Kathie Dike is Medical Director for [facility].

## 2020-04-16 ENCOUNTER — Other Ambulatory Visit: Payer: Self-pay

## 2020-04-16 ENCOUNTER — Encounter (HOSPITAL_COMMUNITY)
Admission: RE | Admit: 2020-04-16 | Discharge: 2020-04-16 | Disposition: A | Discharge status: Home / Self Care | Payer: Medicare Other | Source: Ambulatory Visit | Attending: Cardiology | Admitting: Cardiology

## 2020-04-16 DIAGNOSIS — Z955 Presence of coronary angioplasty implant and graft: Secondary | ICD-10-CM

## 2020-04-16 DIAGNOSIS — I213 ST elevation (STEMI) myocardial infarction of unspecified site: Secondary | ICD-10-CM

## 2020-04-16 NOTE — Progress Notes (Signed)
Daily Session Note  Patient Details  Name: Hunter Jones MRN: [id number] Date of Birth: 01-31-1956 Referring Provider:     [place] from 01/23/2020 in [place]  Referring Provider Domenic Polite      Encounter Date: 04/16/2020  Check In:  Session Check In - 04/16/20 0948      Check-In   Supervising physician immediately available to respond to emergencies [facility] MD immediately available    Physician(s) Dr. Domenic Polite    Location [facility]    Staff Present Hoy Register, MS, ACSM-CEP, Exercise Physiologist;Debra Wynetta Emery, RN, BSN    Virtual Visit No    Medication changes reported     No    Fall or balance concerns reported    No    Tobacco Cessation No Change    Warm-up and Cool-down Performed as group-led instruction    Resistance Training Performed Yes    VAD Patient? No    PAD/SET Patient? No      Pain Assessment   Currently in Pain? No/denies    Pain Score 0-No pain    Multiple Pain Sites No           Capillary Blood Glucose: No results found for this or any previous visit (from the past 24 hour(s)).    Social History   Tobacco Use  Smoking Status Former Smoker  . Years: 40.00  . Types: Cigarettes  . Quit date: 06/21/2012  . Years since quitting: 7.8  Smokeless Tobacco Never Used    Goals Met:  Independence with exercise equipment Exercise tolerated well No report of cardiac concerns or symptoms Strength training completed today  Goals Unmet:  Not Applicable  Comments: checkout time is 1030   Dr. Kathie Dike is Medical Director for [facility].

## 2020-04-18 ENCOUNTER — Encounter (HOSPITAL_COMMUNITY)
Admission: RE | Admit: 2020-04-18 | Discharge: 2020-04-18 | Disposition: A | Discharge status: Home / Self Care | Payer: Medicare Other | Source: Ambulatory Visit | Attending: Cardiology | Admitting: Cardiology

## 2020-04-18 ENCOUNTER — Other Ambulatory Visit: Payer: Self-pay

## 2020-04-18 VITALS — Ht 71.0 in | Wt 259.3 lb

## 2020-04-18 DIAGNOSIS — I213 ST elevation (STEMI) myocardial infarction of unspecified site: Secondary | ICD-10-CM

## 2020-04-18 DIAGNOSIS — Z955 Presence of coronary angioplasty implant and graft: Secondary | ICD-10-CM

## 2020-04-18 NOTE — Progress Notes (Signed)
Discharge Progress Report  Patient Details  Name: Hunter Jones MRN: [id number] Date of Birth: 08/31/1955 Referring Provider:     [place] from 01/23/2020 in [place]  Referring Provider Domenic Polite       Number of Visits: 79  Reason for Discharge:  Patient reached a stable level of exercise. Patient independent in their exercise. Patient has met program and personal goals.  Smoking History:  Social History   Tobacco Use  Smoking Status Former Smoker   Years: 40.00   Types: Cigarettes   Quit date: 06/21/2012   Years since quitting: 7.8  Smokeless Tobacco Never Used    Diagnosis:  ST elevation myocardial infarction (STEMI), unspecified artery (HCC)  S/P coronary artery stent placement  ADL [facility]:   Initial Exercise Prescription:  Initial Exercise Prescription - 01/23/20 1400      Date of Initial Exercise RX and Referring Provider   Date 01/23/20    Referring Provider Domenic Polite    Expected Discharge Date 04/24/20      NuStep   Level 1    SPM 80    Minutes 22      Recumbant Elliptical   Level 1    RPM 60    Minutes 17      Prescription Details   Frequency (times per week) 3    Duration Progress to 30 minutes of continuous aerobic without signs/symptoms of physical distress      Intensity   THRR 40-80% of Max Heartrate 62/124    Ratings of Perceived Exertion 11-13    Perceived Dyspnea 0-4      Progression   Progression Continue progressive overload as per policy without signs/symptoms or physical distress.      Resistance Training   Training Prescription Yes    Weight 2    Reps 10-15           Discharge Exercise Prescription (Final Exercise Prescription Changes):  Exercise Prescription Changes - 04/07/20 1300      Response to Exercise   Blood Pressure (Admit) 126/88    Blood Pressure (Exercise) 150/72    Blood Pressure (Exit) 120/76    Heart Rate (Admit) 76 bpm    Heart Rate (Exercise) 122  bpm    Heart Rate (Exit) 84 bpm    Rating of Perceived Exertion (Exercise) 14    Duration Continue with 30 min of aerobic exercise without signs/symptoms of physical distress.    Intensity THRR unchanged      Progression   Progression Continue to progress workloads to maintain intensity without signs/symptoms of physical distress.      Resistance Training   Training Prescription Yes    Weight 5 lbs    Reps 10-15    Time 10 Minutes      NuStep   Level 5    SPM 145    Minutes 17    METs 5.2      Recumbant Elliptical   Level 5    RPM 79    Minutes 22    METs 4.1           Functional Capacity:  6 Minute Walk    Row Name 01/23/20 1441 04/18/20 1130       6 Minute Walk   Phase Initial Discharge    Distance 1300 feet 1600 feet    Distance % Change -- 23.08 %    Distance Feet Change -- 300 ft    Walk Time 6 minutes 6 minutes    #  of Rest Breaks 0 0    MPH 2.46 3.03    METS 2.4 3.36    RPE 11 9    Perceived Dyspnea  11 --    VO2 Peak 8.41 11.77    Symptoms Yes (comment) No    Comments Bilateral hip pain 3/10. He was a little SOB towards the end of walk test. --    Resting HR 78 bpm 82 bpm    Resting BP 100/60 110/60    Resting Oxygen Saturation  98 % 99 %    Exercise Oxygen Saturation  during 6 min walk 96 % 98 %    Max Ex. HR 100 bpm 116 bpm    Max Ex. BP 110/64 132/66    2 Minute Post BP 102/58 108/60           Psychological, QOL, Others - Outcomes: PHQ 2/9: Depression screen [facility] 2/9 04/18/2020 01/23/2020  Decreased Interest 0 0  Down, Depressed, Hopeless 0 0  PHQ - 2 Score 0 0  Altered sleeping 1 0  Tired, decreased energy 1 1  Change in appetite 0 0  Feeling bad or failure about yourself  0 0  Trouble concentrating 0 0  Moving slowly or fidgety/restless 0 0  Suicidal thoughts 0 0  PHQ-9 Score 2 1  Difficult doing work/chores Not difficult at all -  Some recent data might be hidden    Quality of Life:  Quality of Life - 04/18/20 1127       Quality of Life Scores   Health/Function Pre 21 %    Health/Function Post 24.83 %    Health/Function % Change 18.24 %    Socioeconomic Pre 24.88 %    Socioeconomic Post 23.21 %    Socioeconomic % Change  -6.71 %    Psych/Spiritual Pre 23.57 %    Psych/Spiritual Post 25.57 %    Psych/Spiritual % Change 8.49 %    Family Pre 28.8 %    Family Post 27.6 %    Family % Change -4.17 %    GLOBAL Pre 23.51 %    GLOBAL Post 25.06 %    GLOBAL % Change 6.59 %           Personal Goals: Goals established at orientation with interventions provided to work toward goal.  Personal Goals and Risk Factors at Admission - 01/23/20 1441      Core Components/Risk Factors/Patient Goals on Admission    Weight Management Obesity    Diabetes Yes    Intervention Provide education about signs/symptoms and action to take for hypo/hyperglycemia.;Provide education about proper nutrition, including hydration, and aerobic/resistive exercise prescription along with prescribed medications to achieve blood glucose in normal ranges: Fasting glucose 65-99 mg/dL    Expected Outcomes Short Term: Participant verbalizes understanding of the signs/symptoms and immediate care of hyper/hypoglycemia, proper foot care and importance of medication, aerobic/resistive exercise and nutrition plan for blood glucose control.    Personal Goal Other Yes    Personal Goal Get in better shape. Be able to return to work.    Intervention Patient will attend CR 3 days/week and supplement with exercise at home 2 days/week.    Expected Outcomes Patient will meet both program and personal goals.            Personal Goals Discharge:  Goals and Risk Factor Review    Row Name 02/11/20 1256 03/10/20 1330 04/03/20 1538 04/18/20 1417       Core Components/Risk Factors/Patient Goals Review  Personal Goals Review Weight Management/Obesity;Diabetes;Other  Get in shape; get back to work. Weight Management/Obesity;Diabetes;Other  Get in shape; get  back to work. Weight Management/Obesity;Diabetes;Other  Get in shape; get back to work. --    Review Patient has completed 7 sessions losing 8 lbs since his intial visit. He is doing well in the program with consistent attendance and progression. He says he is feeling stronger and has more energy. 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 He says he is feeling like he is working toward getting in shape and getting his heart stronger. Will continue to monitor for progress. Patient has completed 18 sessions losing 3 lbs since last 30 day review. He continues to do well in the program with progressions and consistent attendance. He continues to be followed by oncologist with treatment of prostate cance with bone metatasis. He says he continues to feel stronger and has more energy and feels like he is getting in shape. 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 Will continue to monitor for progress. Patient has completed 26 sessions and will graduate soon. He continues to do well in the program with  progressions and consistent attendance. He continues to be followed by oncology with treatment of prostate cancer with bone metatasis. He continues to report improvement in his energy and strength. He is ready to return to work and plans to do so when he finishes the program. Will continue to monitor. Patient completed the program with 33 sessions. He lost 13 lbs overall. His medficts score improved by 62%. He says he has learned how to watch what he is eating. His exit measurement improved in grip strength and his exit walk test increased by 33.08%. 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 He plans to return to work after a vacation trip. He says he feels stronger and has more energy now and is very pleased with the program and feels like he met his personal goals. He also met program goals. He plans to join the [facility] for continued  exercise. CR will f/u with phone calls.    Expected Outcomes Patient will complete the program meeting both program and personal goals. Patient will complete the program meeting both program and personal goals. Patient will complete the program meeting both program and personal goals. Patient will continue to exercise at the [employer] and continue to acheive his goals.           Exercise Goals and Review:  Exercise Goals    Row Name 01/23/20 1451 02/11/20 1031 03/10/20 1252 04/07/20 1344       Exercise Goals   Increase Physical Activity Yes Yes Yes Yes    Intervention Provide advice, education, support and counseling about physical activity/exercise needs.;Develop an individualized exercise prescription for aerobic and resistive training based on initial evaluation findings, risk stratification, comorbidities and participant's personal goals. Provide advice, education, support and counseling about physical activity/exercise needs.;Develop an individualized exercise prescription for aerobic and resistive training based on initial evaluation findings, risk stratification, comorbidities and participant's personal goals. Provide advice, education, support and counseling about physical activity/exercise needs.;Develop an individualized exercise prescription for aerobic and resistive training based on initial evaluation findings, risk stratification, comorbidities and participant's personal goals. Provide advice, education, support and counseling about physical activity/exercise needs.;Develop an individualized exercise prescription for aerobic and resistive training based on initial evaluation findings, risk stratification, comorbidities and participant's personal goals.  Expected Outcomes Short Term: Attend rehab on a regular basis to increase amount of physical activity.;Long Term: Add in home exercise to make exercise part of routine and to increase amount of physical activity.;Long Term: Exercising  regularly at least 3-5 days a week. Short Term: Attend rehab on a regular basis to increase amount of physical activity.;Long Term: Add in home exercise to make exercise part of routine and to increase amount of physical activity.;Long Term: Exercising regularly at least 3-5 days a week. Short Term: Attend rehab on a regular basis to increase amount of physical activity.;Long Term: Add in home exercise to make exercise part of routine and to increase amount of physical activity.;Long Term: Exercising regularly at least 3-5 days a week. Short Term: Attend rehab on a regular basis to increase amount of physical activity.;Long Term: Add in home exercise to make exercise part of routine and to increase amount of physical activity.;Long Term: Exercising regularly at least 3-5 days a week.    Increase Strength and Stamina Yes Yes Yes Yes    Intervention -- Provide advice, education, support and counseling about physical activity/exercise needs.;Develop an individualized exercise prescription for aerobic and resistive training based on initial evaluation findings, risk stratification, comorbidities and participant's personal goals. Provide advice, education, support and counseling about physical activity/exercise needs.;Develop an individualized exercise prescription for aerobic and resistive training based on initial evaluation findings, risk stratification, comorbidities and participant's personal goals. Provide advice, education, support and counseling about physical activity/exercise needs.;Develop an individualized exercise prescription for aerobic and resistive training based on initial evaluation findings, risk stratification, comorbidities and participant's personal goals.    Expected Outcomes Short Term: Increase workloads from initial exercise prescription for resistance, speed, and METs.;Short Term: Perform resistance training exercises routinely during rehab and add in resistance training at home;Long Term:  Improve cardiorespiratory fitness, muscular endurance and strength as measured by increased METs and functional capacity (6MWT) Short Term: Increase workloads from initial exercise prescription for resistance, speed, and METs.;Short Term: Perform resistance training exercises routinely during rehab and add in resistance training at home;Long Term: Improve cardiorespiratory fitness, muscular endurance and strength as measured by increased METs and functional capacity (6MWT) Short Term: Increase workloads from initial exercise prescription for resistance, speed, and METs.;Short Term: Perform resistance training exercises routinely during rehab and add in resistance training at home;Long Term: Improve cardiorespiratory fitness, muscular endurance and strength as measured by increased METs and functional capacity (6MWT) Short Term: Increase workloads from initial exercise prescription for resistance, speed, and METs.;Short Term: Perform resistance training exercises routinely during rehab and add in resistance training at home;Long Term: Improve cardiorespiratory fitness, muscular endurance and strength as measured by increased METs and functional capacity (6MWT)    Able to understand and use rate of perceived exertion (RPE) scale Yes Yes Yes Yes    Intervention Provide education and explanation on how to use RPE scale Provide education and explanation on how to use RPE scale Provide education and explanation on how to use RPE scale Provide education and explanation on how to use RPE scale    Expected Outcomes Short Term: Able to use RPE daily in rehab to express subjective intensity level;Long Term:  Able to use RPE to guide intensity level when exercising independently Short Term: Able to use RPE daily in rehab to express subjective intensity level;Long Term:  Able to use RPE to guide intensity level when exercising independently Short Term: Able to use RPE daily in rehab to express subjective intensity level;Long  Term:  Able to use RPE to guide intensity level when exercising independently Short Term: Able to use RPE daily in rehab to express subjective intensity level;Long Term:  Able to use RPE to guide intensity level when exercising independently    Knowledge and understanding of Target Heart Rate Range (THRR) Yes Yes Yes Yes    Intervention Provide education and explanation of THRR including how the numbers were predicted and where they are located for reference Provide education and explanation of THRR including how the numbers were predicted and where they are located for reference Provide education and explanation of THRR including how the numbers were predicted and where they are located for reference Provide education and explanation of THRR including how the numbers were predicted and where they are located for reference    Expected Outcomes Short Term: Able to state/look up THRR;Long Term: Able to use THRR to govern intensity when exercising independently;Short Term: Able to use daily as guideline for intensity in rehab Short Term: Able to state/look up THRR;Long Term: Able to use THRR to govern intensity when exercising independently;Short Term: Able to use daily as guideline for intensity in rehab Short Term: Able to state/look up THRR;Long Term: Able to use THRR to govern intensity when exercising independently;Short Term: Able to use daily as guideline for intensity in rehab Short Term: Able to state/look up THRR;Long Term: Able to use THRR to govern intensity when exercising independently;Short Term: Able to use daily as guideline for intensity in rehab    Able to check pulse independently Yes Yes Yes Yes    Intervention Provide education and demonstration on how to check pulse in carotid and radial arteries.;Review the importance of being able to check your own pulse for safety during independent exercise Provide education and demonstration on how to check pulse in carotid and radial arteries.;Review  the importance of being able to check your own pulse for safety during independent exercise Provide education and demonstration on how to check pulse in carotid and radial arteries.;Review the importance of being able to check your own pulse for safety during independent exercise Provide education and demonstration on how to check pulse in carotid and radial arteries.;Review the importance of being able to check your own pulse for safety during independent exercise    Expected Outcomes Short Term: Able to explain why pulse checking is important during independent exercise;Long Term: Able to check pulse independently and accurately Short Term: Able to explain why pulse checking is important during independent exercise;Long Term: Able to check pulse independently and accurately Short Term: Able to explain why pulse checking is important during independent exercise;Long Term: Able to check pulse independently and accurately Short Term: Able to explain why pulse checking is important during independent exercise;Long Term: Able to check pulse independently and accurately    Understanding of Exercise Prescription Yes Yes Yes Yes    Intervention Provide education, explanation, and written materials on patient's individual exercise prescription Provide education, explanation, and written materials on patient's individual exercise prescription Provide education, explanation, and written materials on patient's individual exercise prescription Provide education, explanation, and written materials on patient's individual exercise prescription    Expected Outcomes Short Term: Able to explain program exercise prescription;Long Term: Able to explain home exercise prescription to exercise independently Short Term: Able to explain program exercise prescription;Long Term: Able to explain home exercise prescription to exercise independently Short Term: Able to explain program exercise prescription;Long Term: Able to explain home  exercise prescription to exercise independently Short Term: Able to  explain program exercise prescription;Long Term: Able to explain home exercise prescription to exercise independently           Exercise Goals Re-Evaluation:  Exercise Goals Re-Evaluation    [place] Name 02/11/20 1031 03/10/20 1252           Exercise Goal Re-Evaluation   Exercise Goals Review Increase Physical Activity;Increase Strength and Stamina;Able to understand and use rate of perceived exertion (RPE) scale;Knowledge and understanding of Target Heart Rate Range (THRR);Able to check pulse independently;Understanding of Exercise Prescription Increase Physical Activity;Increase Strength and Stamina;Able to understand and use rate of perceived exertion (RPE) scale;Knowledge and understanding of Target Heart Rate Range (THRR);Able to check pulse independently;Understanding of Exercise Prescription      Comments Pt has completed 6 exercise sessions. He is progressing well and is a very hard worker who is highly motivated. He currently exercises at 5.6 METs on the stepper. Will continue to monitor and progress as able. Pt has completed 17 exercise sessions. He continues to progress well and pushes himself every visit. He is eager for workload increases and continues to be a pleasure to work with. He currently exercises at 6.5 METs on the stepper. Will continue to monitor and progress as able.      Expected Outcomes Through exercise at rehab and by beginning a home exercise program, the patient will be able to reach their stated goals. Through exercise at rehab and by engaging in a home exercise plan, the patient will be able to reach their goals.             Nutrition & Weight - Outcomes:  Pre Biometrics - 01/23/20 1452      Pre Biometrics   Height 5' 11" (1.803 m)    Weight 123.5 kg    Waist Circumference 49 inches    Hip Circumference 43 inches    Waist to Hip Ratio 1.14 %    BMI (Calculated) 37.99    Triceps Skinfold 19  mm    % Body Fat 35.7 %    Grip Strength 32.5 kg    Flexibility 0 in    Single Leg Stand 4.58 seconds           Post Biometrics - 04/18/20 1131       Post  Biometrics   Height 5' 11" (1.803 m)    Weight 117.6 kg    Waist Circumference 48 inches    Hip Circumference 48 inches    Waist to Hip Ratio 1 %    BMI (Calculated) 36.18    Triceps Skinfold 22 mm    % Body Fat 35.6 %    Grip Strength 37.9 kg    Flexibility 0 in    Single Leg Stand 6.21 seconds           Nutrition:  Nutrition Therapy & Goals - 04/03/20 1538      Personal Nutrition Goals   Comments Patient continues to say he is trying to cut back on his sweets and is trying to make healthier choices working with his wife. We continue to provide education with handouts.  Will continue to monitor.      Intervention Plan   Intervention Nutrition handout(s) given to patient.           Nutrition Discharge:  Nutrition Assessments - 04/18/20 1128      MEDFICTS Scores   Pre Score 123    Post Score 46    Score Difference -77  Education Questionnaire Score:  Knowledge Questionnaire Score - 04/18/20 1128      Knowledge Questionnaire Score   Post Score 20/24          Patient graduated from [place] today on 04/18/20 after completing 33 sessions. They achieved LTG of 30 minutes of aerobic exercise at Max Met level of 4.1 All patients vitals are WNL. Discharge instruction has been reviewed in detail and patient stated an understanding of material given. Patient plans to exercise at the [facility]. Cardiac Rehab staff will make f/u call. Patient had no complaints of any abnormal S/S or pain on their exit visit.   Goals reviewed with patient; copy given to patient.

## 2020-04-18 NOTE — Progress Notes (Signed)
Daily Session Note  Patient Details  Name: Hunter Jones MRN: [id number] Date of Birth: May 19, 1956 Referring Provider:     [place] from 01/23/2020 in [place]  Referring Provider Domenic Polite      Encounter Date: 04/18/2020  Check In:  Session Check In - 04/18/20 1008      Check-In   Supervising physician immediately available to respond to emergencies [facility] MD immediately available    Physician(s) Dr. Harl Bowie    Location [facility]    Staff Present Hoy Register, MS, ACSM-CEP, Exercise Physiologist;Debra Wynetta Emery, RN, BSN    Virtual Visit No    Medication changes reported     No    Fall or balance concerns reported    No    Tobacco Cessation No Change    Warm-up and Cool-down Performed as group-led instruction    Resistance Training Performed Yes    VAD Patient? No    PAD/SET Patient? No      Pain Assessment   Currently in Pain? No/denies    Pain Score 0-No pain    Multiple Pain Sites No           Capillary Blood Glucose: No results found for this or any previous visit (from the past 24 hour(s)).    Social History   Tobacco Use  Smoking Status Former Smoker  . Years: 40.00  . Types: Cigarettes  . Quit date: 06/21/2012  . Years since quitting: 7.8  Smokeless Tobacco Never Used    Goals Met:  Independence with exercise equipment Exercise tolerated well No report of cardiac concerns or symptoms Strength training completed today  Goals Unmet:  Not Applicable  Comments: checkout time is 1030   Dr. Kathie Dike is Medical Director for [facility].

## 2020-04-21 ENCOUNTER — Encounter (HOSPITAL_COMMUNITY): Payer: Medicare Other

## 2020-05-07 ENCOUNTER — Inpatient Hospital Stay (HOSPITAL_COMMUNITY): Payer: Medicare Other | Attending: Hematology

## 2020-05-07 ENCOUNTER — Other Ambulatory Visit: Payer: Self-pay

## 2020-05-07 DIAGNOSIS — C61 Malignant neoplasm of prostate: Secondary | ICD-10-CM | POA: Diagnosis present

## 2020-05-07 DIAGNOSIS — C7951 Secondary malignant neoplasm of bone: Secondary | ICD-10-CM | POA: Diagnosis present

## 2020-05-07 DIAGNOSIS — Z5111 Encounter for antineoplastic chemotherapy: Secondary | ICD-10-CM | POA: Insufficient documentation

## 2020-05-07 LAB — CBC WITH DIFFERENTIAL/PLATELET
Abs Immature Granulocytes: 0.02 10*3/uL (ref 0.00–0.07)
Basophils Absolute: 0 10*3/uL (ref 0.0–0.1)
Basophils Relative: 0 %
Eosinophils Absolute: 0.3 10*3/uL (ref 0.0–0.5)
Eosinophils Relative: 4 %
HCT: 39.6 % (ref 39.0–52.0)
Hemoglobin: 13.5 g/dL (ref 13.0–17.0)
Immature Granulocytes: 0 %
Lymphocytes Relative: 33 %
Lymphs Abs: 2.6 10*3/uL (ref 0.7–4.0)
MCH: 33.3 pg (ref 26.0–34.0)
MCHC: 34.1 g/dL (ref 30.0–36.0)
MCV: 97.8 fL (ref 80.0–100.0)
Monocytes Absolute: 0.6 10*3/uL (ref 0.1–1.0)
Monocytes Relative: 8 %
Neutro Abs: 4.3 10*3/uL (ref 1.7–7.7)
Neutrophils Relative %: 55 %
Platelets: 158 10*3/uL (ref 150–400)
RBC: 4.05 MIL/uL — ABNORMAL LOW (ref 4.22–5.81)
RDW: 13.2 % (ref 11.5–15.5)
WBC: 7.8 10*3/uL (ref 4.0–10.5)
nRBC: 0 % (ref 0.0–0.2)

## 2020-05-07 LAB — COMPREHENSIVE METABOLIC PANEL
ALT: 28 U/L (ref 0–44)
AST: 26 U/L (ref 15–41)
Albumin: 3.9 g/dL (ref 3.5–5.0)
Alkaline Phosphatase: 86 U/L (ref 38–126)
Anion gap: 8 (ref 5–15)
BUN: 25 mg/dL — ABNORMAL HIGH (ref 8–23)
CO2: 24 mmol/L (ref 22–32)
Calcium: 8.9 mg/dL (ref 8.9–10.3)
Chloride: 108 mmol/L (ref 98–111)
Creatinine, Ser: 1.33 mg/dL — ABNORMAL HIGH (ref 0.61–1.24)
GFR, Estimated: 59 mL/min — ABNORMAL LOW (ref >60)
Glucose, Bld: 117 mg/dL — ABNORMAL HIGH (ref 70–99)
Potassium: 4.3 mmol/L (ref 3.5–5.1)
Sodium: 140 mmol/L (ref 135–145)
Total Bilirubin: 0.6 mg/dL (ref 0.3–1.2)
Total Protein: 6.6 g/dL (ref 6.5–8.1)

## 2020-05-07 LAB — PSA: Prostatic Specific Antigen: 8.67 ng/mL — ABNORMAL HIGH (ref 0.00–4.00)

## 2020-05-08 LAB — TESTOSTERONE: Testosterone: <3 ng/dL — ABNORMAL LOW (ref 264–916)

## 2020-05-12 ENCOUNTER — Other Ambulatory Visit (HOSPITAL_COMMUNITY): Payer: Self-pay | Admitting: *Deleted

## 2020-05-12 ENCOUNTER — Encounter (HOSPITAL_COMMUNITY): Payer: Self-pay | Admitting: Hematology

## 2020-05-12 ENCOUNTER — Inpatient Hospital Stay (HOSPITAL_COMMUNITY): Payer: Medicare Other

## 2020-05-12 ENCOUNTER — Other Ambulatory Visit: Payer: Self-pay

## 2020-05-12 ENCOUNTER — Inpatient Hospital Stay (HOSPITAL_BASED_OUTPATIENT_CLINIC_OR_DEPARTMENT_OTHER): Payer: Medicare Other | Admitting: Hematology

## 2020-05-12 VITALS — BP 110/70 | HR 78 | Temp 97.0°F | Resp 20 | Wt 258.1 lb

## 2020-05-12 DIAGNOSIS — Z5111 Encounter for antineoplastic chemotherapy: Secondary | ICD-10-CM | POA: Diagnosis not present

## 2020-05-12 DIAGNOSIS — C61 Malignant neoplasm of prostate: Secondary | ICD-10-CM | POA: Diagnosis not present

## 2020-05-12 DIAGNOSIS — C7951 Secondary malignant neoplasm of bone: Secondary | ICD-10-CM

## 2020-05-12 MED ORDER — BICALUTAMIDE 50 MG PO TABS: 50.0000 mg | ORAL_TABLET | Freq: Every day | ORAL | 2 refills | Status: DC

## 2020-05-12 MED ORDER — DENOSUMAB 120 MG/1.7ML ~~LOC~~ SOLN
120.0000 mg | Freq: Once | SUBCUTANEOUS | Status: AC
  Administered 2020-05-12: 120 mg via SUBCUTANEOUS
  Filled 2020-05-12: qty 1.7

## 2020-05-12 MED ORDER — ERLEADA 60 MG PO TABS: 120.0000 mg | ORAL_TABLET | Freq: Every day | ORAL | 1 refills | Status: DC

## 2020-05-12 MED ORDER — LEUPROLIDE ACETATE (3 MONTH) 22.5 MG ~~LOC~~ KIT
22.5000 mg | PACK | Freq: Once | SUBCUTANEOUS | Status: AC
  Administered 2020-05-12: 22.5 mg via SUBCUTANEOUS
  Filled 2020-05-12: qty 22.5

## 2020-05-12 NOTE — Progress Notes (Signed)
Pt is taking Casodex as prescribed with no side effects.

## 2020-05-12 NOTE — Patient Instructions (Signed)
[place] at [facility] Discharge Instructions  You were seen today by Dr. Delton Coombes. He went over your recent results. You received your Xgeva and Lupron injections today. Stop taking the Casodex. You will be started on Erleada 2 tablets daily to take with or without food for your prostate cancer. Dr. Delton Coombes will see you back in 4 weeks for labs and follow up.   Thank you for choosing [place] at [facility] to provide your oncology and hematology care.  To afford each patient quality time with our provider, please arrive at least 15 minutes before your scheduled appointment time.   If you have a lab appointment with the [facility] please come in thru the Main Entrance and check in at the main information desk  You need to re-schedule your appointment should you arrive 10 or more minutes late.  We strive to give you quality time with our providers, and arriving late affects you and other patients whose appointments are after yours.  Also, if you no show three or more times for appointments you may be dismissed from the clinic at the providers discretion.     Again, thank you for choosing [facility].  Our hope is that these requests will decrease the amount of time that you wait before being seen by our physicians.       _____________________________________________________________  Should you have questions after your visit to [facility], please contact our office at [phone] between the hours of 8:00 a.m. and 4:30 p.m.  Voicemails left after 4:00 p.m. will not be returned until the following business day.  For prescription refill requests, have your pharmacy contact our office and allow 72 hours.    Cancer Center Support Programs:   > Cancer Support Group  2nd Tuesday of the month 1pm-2pm, Journey Room

## 2020-05-12 NOTE — Progress Notes (Signed)
Patient tolerated Eligard injection with no complaints voiced.  Site clean and dry with no bruising or swelling noted at site.  Band aid applied.  VSS with discharge and left ambulatory with no s/s of distress noted.  Patient taking calcium as directed.  Denied tooth, jaw, and leg pain.  No recent or upcoming dental visits.  Patient tolerated Xgeva injection with no complaints.  Site clean and dry with no bruising or swelling noted at site.  Band aid applied.  VSS with discharge and left ambulatory with no s/s of distress noted.

## 2020-05-12 NOTE — Progress Notes (Signed)
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 Radical resection of prostate on 06/27/2014. 2. Zytiga from 05/19/2018 to 11/09/2019. 3. Xtandi from 11/21/2019 to 12/05/2019, stopped due to mood changes.  NGS Results: Guardant AR amplification, TMB 16 Muts/Mb  CURRENT THERAPY: Lupron every 3 months, Xgeva monthly  BRIEF ONCOLOGIC HISTORY:  Oncology History  Prostate Hunter ([place])  02/06/2014 Procedure   Prostate biopsy by Dr. 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  Total Gleason score equals 7.  Proportion of prostatic tissue involved by tumor: 70%.   05/03/2014 Imaging   Bone scan- Negative    06/27/2014 Procedure   Radical resection of prostate by Dr. Raynelle Jones   07/29/2014 Pathology Results   1. Prostate, radical resection - PROSTATIC ADENOCARCINOMA, GLEASON SCORE 4 + 3 = 7. - RIGHT AND LEFT PROSTATE INVOLVED. - RIGHT AND LEFT SEMINAL VESICLES INVOLVED BY TUMOR. - EXTRACAPSULAR EXTENSION BY TUMOR. - TUMOR EXTENDS INTO BLADDER NECK TISSUE. - MARGINS NOT INVOLVED. 2. Lymph nodes, regional resection, left pelvic - THREE BENIGN LYMPH NODES (0/3). 3. Lymph nodes, regional resection, right pelvic - FOUR BENIGN LYMPH NODES (0/4).   11/16/2016 Imaging   Bone scan- New areas of increased uptake superolateral to the left orbit (faint), at S1, and in the posterolateral aspect of the left sixth rib.    11/25/2016 Imaging   CT CAP- 1. Status post radical prostatectomy. No findings to suggest local recurrence of disease or definite extraskeletal metastatic disease in the chest, abdomen or pelvis. However,  there are several osseous lesions, as above, concerning for metastatic disease to the bones. 2. Hepatic steatosis. 3. Aortic atherosclerosis, in addition to 2 vessel coronary artery disease. Please note that although the presence of coronary artery calcium documents the presence of coronary artery disease, the severity of this disease and any potential stenosis cannot be assessed on this non-gated CT examination. Assessment for potential risk factor modification, dietary therapy or pharmacologic therapy may be warranted, if clinically indicated. 4. Additional incidental findings, as above.    Genetic Testing   Guardant 360 Results:           Hunter Jones) Staging form: Prostate, AJCC 7th Edition - Pathologic stage from 07/29/2014: Stage III (T3b, N0, cM0, Gleason 7) - Signed by Hunter Jones on 11/09/2016 - Clinical: Stage IV (T3, N0, M1, PSA: Less than 10, Gleason 7) - Signed by Hunter First, MD on 12/21/2016 - Pathologic: No stage assigned - Unsigned   INTERVAL HISTORY:  Hunter Jones, a 64 y.o. male, returns for routine follow-up of his prostate Hunter. Hunter Jones was last seen on 04/10/2020.   Today he reports feeling well. He denies having any more CP since he finished rehab. He stopped taking Xtandi after taking 2 tablets daily. He denies having any new pains and only gets hot flashes after getting Lupron. He is wearing compression socks for his ankle swelling for the past 3 months.   REVIEW OF SYSTEMS:  Review of Systems  Constitutional: Positive for fatigue (75%). Negative for appetite change.  HENT:   Positive for nosebleeds (intermittent).  Respiratory: Positive for shortness of breath (w/ activity).   Cardiovascular: Positive for leg swelling (wearing compression socks). Negative for chest pain.  Endocrine: Positive for hot flashes (after Lupron).  Genitourinary: Positive for frequency.   Musculoskeletal: Positive  for back pain (4/10 lower back pain).  Psychiatric/Behavioral: Positive for sleep disturbance.  All other systems reviewed and are negative.   PAST MEDICAL/SURGICAL HISTORY:  Past Medical History:  Diagnosis Date  . Acute ST elevation myocardial infarction (STEMI) of inferior wall ([place]) 11/2019  . Asthma   . COPD (chronic obstructive pulmonary disease) ([place])   . Coronary artery disease    DES to mid LAD August 2014 ([facility]); overlapping DESx2 prox-mid RCA 11/2019  . Essential hypertension   . Hyperthyroidism   . NSTEMI (non-ST elevated myocardial infarction) ([place]) 2014  . Prostate Hunter ([place])   . Type 2 diabetes mellitus ([place])    Past Surgical History:  Procedure Laterality Date  . APPENDECTOMY    . CORONARY/GRAFT ACUTE MI REVASCULARIZATION N/A 12/10/2019   Procedure: Coronary/Graft Acute MI Revascularization;  Surgeon: Nelva Bush, MD;  Location: [facility];  Service: Cardiovascular;  Laterality: N/A;  . LEFT HEART CATH AND CORONARY ANGIOGRAPHY N/A 12/10/2019   Procedure: LEFT HEART CATH AND CORONARY ANGIOGRAPHY;  Surgeon: Nelva Bush, MD;  Location: [facility];  Service: Cardiovascular;  Laterality: N/A;  . LYMPHADENECTOMY Bilateral 06/27/2014   Procedure: LYMPHADENECTOMY;  Surgeon: [place] Bring, MD;  Location: [facility];  Service: Urology;  Laterality: Bilateral;  . ROBOT ASSISTED LAPAROSCOPIC RADICAL PROSTATECTOMY N/A 06/27/2014   Procedure: ROBOTIC ASSISTED LAPAROSCOPIC RADICAL PROSTATECTOMY LEVEL 3;  Surgeon: [place] Bring, MD;  Location: [facility];  Service: Urology;  Laterality: N/A;  . TONSILLECTOMY      SOCIAL HISTORY:  Social History   Socioeconomic History  . Marital status: Married    Spouse name: Not on file  . Number of children: Not on file  . Years of education: Not on file  . Highest education level: Not on file  Occupational History  . Not on file  Tobacco Use  . Smoking status: Former Smoker    Years: 40.00    Types: Cigarettes     Quit date: 06/21/2012    Years since quitting: 7.8  . Smokeless tobacco: Never Used  Vaping Use  . Vaping Use: Never used  Substance and Sexual Activity  . Alcohol use: Yes    Comment: occasional  . Drug use: No  . Sexual activity: Not on file  Other Topics Concern  . Not on file  Social History Narrative  . Not on file   Social Determinants of Health   Financial Resource Strain:   . Difficulty of Paying Living Expenses: Not on file  Food Insecurity:   . Worried About Charity fundraiser in the Last Year: Not on file  . Ran Out of Food in the Last Year: Not on file  Transportation Needs:   . Lack of Transportation (Medical): Not on file  . Lack of Transportation (Non-Medical): Not on file  Physical Activity:   . Days of Exercise per Week: Not on file  . Minutes of Exercise per Session: Not on file  Stress:   . Feeling of Stress : Not on file  Social Connections:   . Frequency of Communication with Friends and Family: Not on file  . Frequency of Social Gatherings with Friends and Family: Not on file  . Attends Religious Services: Not on file  . Active Member of  Clubs or Organizations: Not on file  . Attends Archivist Meetings: Not on file  . Marital Status: Not on file  Intimate Partner Violence:   . Fear of Current or Ex-Partner: Not on file  . Emotionally Abused: Not on file  . Physically Abused: Not on file  . Sexually Abused: Not on file    FAMILY HISTORY:  Family History  Problem Relation Age of Onset  . Dementia Mother   . Thyroid disease Mother   . Clotting disorder Mother   . Kidney Stones Father   . Stroke Brother   . Alzheimer's disease Maternal Uncle   . Tuberculosis Paternal Grandmother   . Heart attack Maternal Uncle        x4  . Heart attack Cousin        in his 23s    CURRENT MEDICATIONS:  Current Outpatient Medications  Medication Sig Dispense Refill  . ibuprofen (ADVIL) 200 MG tablet Take 200 mg by mouth every 6 (six) hours as  needed.    [name] Kitchen albuterol (PROVENTIL HFA;VENTOLIN HFA) 108 (90 BASE) MCG/ACT inhaler Inhale 1-2 puffs into the lungs every 6 (six) hours as needed for wheezing or shortness of breath.     [name] Kitchen apalutamide (ERLEADA) 60 MG tablet Take 2 tablets (120 mg total) by mouth daily. May be taken with or without food. Swallow tablets whole. 60 tablet 1  . aspirin EC 81 MG tablet Take 81 mg by mouth daily.     [name] Kitchen atorvastatin (LIPITOR) 40 MG tablet TAKE 1 TABLET BY MOUTH AT BEDTIME. (Patient taking differently: Take 40 mg by mouth at bedtime. ) 30 tablet 6  . Calcium-Magnesium-Vitamin D (CALCIUM 500 PO) Take 2 tablets by mouth daily with breakfast. Per pt he takes 629m BID    . Co-Enzyme Q-10 100 MG CAPS Take 1 capsule by mouth daily.    .[name] Kitchendenosumab (XGEVA) 120 MG/1.7ML SOLN injection Inject 120 mg into the skin every 30 (thirty) days.    . fluticasone-salmeterol (ADVAIR HFA) 115-21 MCG/ACT inhaler Inhale 2 puffs into the lungs 2 (two) times daily.     .[name] KitchenKRILL OIL PO Take 1 capsule by mouth daily.     .[name] KitchenLeuprolide Acetate, 3 Month, (LUPRON DEPOT, 54-MONTH, IM) Inject into the muscle every 3 (three) months.    . methimazole (TAPAZOLE) 10 MG tablet Take 10 mg by mouth every other day.     . metoprolol succinate (TOPROL XL) 25 MG 24 hr tablet Take 1 tablet (25 mg total) by mouth daily. 90 tablet 1  . Multiple Vitamin (MULTIVITAMIN) tablet Take 1 tablet by mouth daily.    . nitroGLYCERIN (NITROSTAT) 0.4 MG SL tablet Place 1 tablet (0.4 mg total) under the tongue every 5 (five) minutes as needed. (Patient not taking: Reported on 04/10/2020) 25 tablet 12  . Potassium 99 MG TABS Take 1 tablet by mouth daily.    . ticagrelor (BRILINTA) 90 MG TABS tablet Take 1 tablet (90 mg total) by mouth 2 (two) times daily. 60 tablet 11  . triamcinolone cream (KENALOG) 0.1 % Apply 1 application topically daily as needed (For lg rash).  (Patient not taking: Reported on 04/10/2020)     No current facility-administered medications for this  visit.   Facility-Administered Medications Ordered in Other Visits  Medication Dose Route Frequency Provider Last Rate Last Admin  . denosumab (XGEVA) injection 120 mg  120 mg Subcutaneous Once KDerek Jack MD      . Leuprolide Acetate (3 Month) ([facility]  22.5 MG injection 22.5 mg  22.5 mg Subcutaneous Once Derek Jack, MD        ALLERGIES:  Allergies  Allergen Reactions  . Xtandi [Enzalutamide] Other (See Comments)    "Severe personality change"   . Penicillins Hives    PHYSICAL EXAM:  Performance status (ECOG): 1 - Symptomatic but completely ambulatory  Vitals:   05/12/20 1146  BP: 110/70  Pulse: 78  Resp: 20  Temp: (!) 97 F (36.1 C)  SpO2: 97%   Wt Readings from Last 3 Encounters:  05/12/20 258 lb 1.6 oz (117.1 kg)  04/18/20 259 lb 4.2 oz (117.6 kg)  04/10/20 264 lb (119.7 kg)   Physical Exam Vitals reviewed.  Constitutional:      Appearance: Normal appearance. He is obese.  Cardiovascular:     Rate and Rhythm: Normal rate and regular rhythm.     Pulses: Normal pulses.     Heart sounds: Normal heart sounds.  Pulmonary:     Effort: Pulmonary effort is normal.     Breath sounds: Normal breath sounds.  Musculoskeletal:     Right lower leg: Edema (trace) present.     Left lower leg: Edema (trace) present.  Neurological:     General: No focal deficit present.     Mental Status: He is alert and oriented to person, place, and time.  Psychiatric:        Mood and Affect: Mood normal.        Behavior: Behavior normal.      LABORATORY DATA:  I have reviewed the labs as listed.  CBC Latest Ref Rng & Units 05/07/2020 04/09/2020 03/13/2020  WBC 4.0 - 10.5 K/uL 7.8 7.0 6.8  Hemoglobin 13.0 - 17.0 g/dL 13.5 13.6 13.5  Hematocrit 39 - 52 % 39.6 40.1 40.0  Platelets 150 - 400 K/uL 158 149(L) 150   CMP Latest Ref Rng & Units 05/07/2020 04/09/2020 03/13/2020  Glucose 70 - 99 mg/dL 117(H) 103(H) 133(H)  BUN 8 - 23 mg/dL 25(H) 24(H) 31(H)  Creatinine  0.61 - 1.24 mg/dL 1.33(H) 1.22 1.34(H)  Sodium 135 - 145 mmol/L 140 137 139  Potassium 3.5 - 5.1 mmol/L 4.3 5.1 4.3  Chloride 98 - 111 mmol/L 108 105 107  CO2 22 - 32 mmol/L _0 Calcium 8.9 - 10.3 mg/dL 8.9 9.4 9.1  Total Protein 6.5 - 8.1 g/dL 6.6 6.5 6.3(L)  Total Bilirubin 0.3 - 1.2 mg/dL 0.6 0.7 0.6  Alkaline Phos 38 - 126 U/L 86 86 83  AST 15 - 41 U/L _1 ALT 0 - 44 U/L _2 PSA 8.67 (H) 05/07/2020  PSA 4.47 (H) 04/09/2020  PSA 1.66 02/13/2020    DIAGNOSTIC IMAGING:  I have independently reviewed the scans and discussed with the patient. No results found.   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 -Abiraterone 750 mg and prednisone 5 mg started on 06/16/2018. -CTAP and bone scan on 05/23/2019 showed improved response compared to prior scans. -Abiraterone dose increased to 1000 mg daily in the Jones week of January 2021 as PSA was going up. -CT AP on 11/06/2019 showed unchanged appearance of diffuse sclerotic bone metastasis. No visceral mets. -Bone scan on 11/06/2019 shows sclerotic metastatic disease to the sacrum. Similar to slightly increased activity compared to the previous study could be due to technical factors. No new areas. -Xtandi started around 11/21/2019, discontinued on 12/05/2019 due  to mood changes. -ST elevation MI involving the right coronary artery on 12/10/2019, status post cardiac catheterization showing thrombotic occlusion of the proximal RCA, successful PCI with stent placement. -He is on aspirin and Brilinta. -Invitae testing was negative. -Guardant 360 showed AR amplification. No other mutations. -PSA increased to 4.47 on 04/09/2020. -Bicalutamide 50 mg daily started on 04/02/2020.   PLAN:  1. 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 -PSA continues to rise.  Today it is 8.67. -I have told him to discontinue  bicalutamide as it is not helping. -We talked about initiating him on Erleada as he was not too keen on starting chemotherapy. -However [place] is not compatible with Brilinta.  Darolutamide is not approved in the M1 setting. -We'll see him back and talk to him again about docetaxel.  He will receive Lupron today.  2. Bone metastasis: -Continue monthly denosumab.  Calcium is 8.9.  3.Elevated blood sugars: -Continue Jardiance.  4. CADand stents: -Continue aspirin and Brilinta.   Orders placed this encounter:  No orders of the defined types were placed in this encounter.    Derek Jack, MD [place] [phone]   I, Milinda Antis, am acting as a scribe for Dr. Sanda Linger.  I, Derek Jack MD, have reviewed the above documentation for accuracy and completeness, and I agree with the above.

## 2020-05-15 ENCOUNTER — Telehealth (HOSPITAL_COMMUNITY): Payer: Self-pay | Admitting: Pharmacy Technician

## 2020-05-15 NOTE — Telephone Encounter (Signed)
Oral Oncology Patient Advocate Encounter  Received notification from [facility] that prior authorization for Alford [place]land is required.  PA submitted on CoverMyMeds Key [id number] Status is pending  Oral Oncology Clinic will continue to follow.  [place] Patient [place] Phone [phone] Fax [phone] 05/15/2020 10:30 AM

## 2020-05-15 NOTE — Telephone Encounter (Signed)
Oral Oncology Patient Advocate Encounter  Received notification from [facility] that the request for prior authorization for Hunter Jones has been denied due to drug criteria not being met.    This encounter will continue to be updated until final determination.    [place] Patient [place] Phone [phone] Fax [phone] 05/15/2020 10:31 AM

## 2020-05-16 ENCOUNTER — Encounter (HOSPITAL_COMMUNITY): Payer: Self-pay

## 2020-05-16 NOTE — Progress Notes (Signed)
Note received from Dr. Delton Coombes that Hunter Jones is incompatible with patient's Brilinta. I have attempted to call the patient to make him aware of this but was unable to reach the patient at this time. No VM available.

## 2020-05-21 ENCOUNTER — Other Ambulatory Visit (HOSPITAL_COMMUNITY): Payer: Self-pay

## 2020-05-21 DIAGNOSIS — C7951 Secondary malignant neoplasm of bone: Secondary | ICD-10-CM

## 2020-05-21 DIAGNOSIS — C61 Malignant neoplasm of prostate: Secondary | ICD-10-CM

## 2020-05-21 NOTE — Progress Notes (Signed)
Order placed for IR Port Placement 

## 2020-05-23 ENCOUNTER — Inpatient Hospital Stay (HOSPITAL_COMMUNITY): Payer: Medicare Other | Attending: Hematology and Oncology

## 2020-05-23 ENCOUNTER — Other Ambulatory Visit: Payer: Self-pay

## 2020-05-23 ENCOUNTER — Inpatient Hospital Stay (HOSPITAL_BASED_OUTPATIENT_CLINIC_OR_DEPARTMENT_OTHER): Payer: Medicare Other | Admitting: Hematology and Oncology

## 2020-05-23 ENCOUNTER — Encounter (HOSPITAL_COMMUNITY): Payer: Self-pay | Admitting: Hematology and Oncology

## 2020-05-23 VITALS — BP 119/64 | HR 79 | Temp 98.3°F | Resp 18 | Ht 71.0 in | Wt 259.5 lb

## 2020-05-23 DIAGNOSIS — Z8673 Personal history of transient ischemic attack (TIA), and cerebral infarction without residual deficits: Secondary | ICD-10-CM | POA: Diagnosis not present

## 2020-05-23 DIAGNOSIS — C61 Malignant neoplasm of prostate: Secondary | ICD-10-CM | POA: Insufficient documentation

## 2020-05-23 DIAGNOSIS — Z9079 Acquired absence of other genital organ(s): Secondary | ICD-10-CM | POA: Insufficient documentation

## 2020-05-23 DIAGNOSIS — I252 Old myocardial infarction: Secondary | ICD-10-CM | POA: Diagnosis not present

## 2020-05-23 DIAGNOSIS — E119 Type 2 diabetes mellitus without complications: Secondary | ICD-10-CM | POA: Insufficient documentation

## 2020-05-23 DIAGNOSIS — Z87891 Personal history of nicotine dependence: Secondary | ICD-10-CM | POA: Insufficient documentation

## 2020-05-23 DIAGNOSIS — M545 Low back pain, unspecified: Secondary | ICD-10-CM | POA: Diagnosis not present

## 2020-05-23 DIAGNOSIS — C7951 Secondary malignant neoplasm of bone: Secondary | ICD-10-CM

## 2020-05-23 DIAGNOSIS — Z79899 Other long term (current) drug therapy: Secondary | ICD-10-CM | POA: Diagnosis not present

## 2020-05-23 DIAGNOSIS — I2510 Atherosclerotic heart disease of native coronary artery without angina pectoris: Secondary | ICD-10-CM | POA: Diagnosis not present

## 2020-05-23 LAB — COMPREHENSIVE METABOLIC PANEL
ALT: 25 U/L (ref 0–44)
AST: 26 U/L (ref 15–41)
Albumin: 3.9 g/dL (ref 3.5–5.0)
Alkaline Phosphatase: 80 U/L (ref 38–126)
Anion gap: 9 (ref 5–15)
BUN: 27 mg/dL — ABNORMAL HIGH (ref 8–23)
CO2: 25 mmol/L (ref 22–32)
Calcium: 9.3 mg/dL (ref 8.9–10.3)
Chloride: 105 mmol/L (ref 98–111)
Creatinine, Ser: 1.19 mg/dL (ref 0.61–1.24)
GFR, Estimated: >60 mL/min (ref >60)
Glucose, Bld: 129 mg/dL — ABNORMAL HIGH (ref 70–99)
Potassium: 4.1 mmol/L (ref 3.5–5.1)
Sodium: 139 mmol/L (ref 135–145)
Total Bilirubin: 0.6 mg/dL (ref 0.3–1.2)
Total Protein: 6.4 g/dL — ABNORMAL LOW (ref 6.5–8.1)

## 2020-05-23 LAB — CBC WITH DIFFERENTIAL/PLATELET
Abs Immature Granulocytes: 0.03 10*3/uL (ref 0.00–0.07)
Basophils Absolute: 0 10*3/uL (ref 0.0–0.1)
Basophils Relative: 0 %
Eosinophils Absolute: 0.3 10*3/uL (ref 0.0–0.5)
Eosinophils Relative: 3 %
HCT: 37.6 % — ABNORMAL LOW (ref 39.0–52.0)
Hemoglobin: 13.1 g/dL (ref 13.0–17.0)
Immature Granulocytes: 0 %
Lymphocytes Relative: 25 %
Lymphs Abs: 2 10*3/uL (ref 0.7–4.0)
MCH: 33.6 pg (ref 26.0–34.0)
MCHC: 34.8 g/dL (ref 30.0–36.0)
MCV: 96.4 fL (ref 80.0–100.0)
Monocytes Absolute: 0.6 10*3/uL (ref 0.1–1.0)
Monocytes Relative: 7 %
Neutro Abs: 5.3 10*3/uL (ref 1.7–7.7)
Neutrophils Relative %: 65 %
Platelets: 157 10*3/uL (ref 150–400)
RBC: 3.9 MIL/uL — ABNORMAL LOW (ref 4.22–5.81)
RDW: 13.1 % (ref 11.5–15.5)
WBC: 8.3 10*3/uL (ref 4.0–10.5)
nRBC: 0 % (ref 0.0–0.2)

## 2020-05-23 LAB — PSA: Prostatic Specific Antigen: 10.31 ng/mL — ABNORMAL HIGH (ref 0.00–4.00)

## 2020-05-23 NOTE — Progress Notes (Signed)
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 Radical resection of prostate on 06/27/2014. 2. Zytiga from 05/19/2018 to 11/09/2019. 3. Xtandi from 11/21/2019 to 12/05/2019, stopped due to mood changes.  NGS Results: Guardant AR amplification, TMB 16 Muts/Mb CURRENT THERAPY: Lupron every 3 months, Xgeva monthly  BRIEF ONCOLOGIC HISTORY:  Oncology History  Prostate cancer ([place])  02/06/2014 Procedure   Prostate biopsy by Dr. Clyde Lundborg   02/11/2014 Pathology Results   Prostatic adenocarcinoma identified in 5 of 6 prostate specimens with Gleason pattern showing primary pattern-grade 4, secondary pattern grade 3.  Total Gleason score equals 7.  Proportion of prostatic tissue involved by tumor: 70%.   05/03/2014 Imaging   Bone scan- Negative    06/27/2014 Procedure   Radical resection of prostate by Dr. Raynelle Bring   07/29/2014 Pathology Results   1. Prostate, radical resection - PROSTATIC ADENOCARCINOMA, GLEASON SCORE 4 + 3 = 7. - RIGHT AND LEFT PROSTATE INVOLVED. - RIGHT AND LEFT SEMINAL VESICLES INVOLVED BY TUMOR. - EXTRACAPSULAR EXTENSION BY TUMOR. - TUMOR EXTENDS INTO BLADDER NECK TISSUE. - MARGINS NOT INVOLVED. 2. Lymph nodes, regional resection, left pelvic - THREE BENIGN LYMPH NODES (0/3). 3. Lymph nodes, regional resection, right pelvic - FOUR BENIGN LYMPH NODES (0/4).   11/16/2016 Imaging   Bone scan- New areas of increased uptake superolateral to the left orbit (faint), at S1, and in the posterolateral aspect of the left sixth rib.    11/25/2016 Imaging   CT CAP- 1. Status post radical prostatectomy. No findings to suggest local recurrence of disease or definite extraskeletal metastatic disease in the chest, abdomen or pelvis. However,  there are several osseous lesions, as above, concerning for metastatic disease to the bones. 2. Hepatic steatosis. 3. Aortic atherosclerosis, in addition to 2 vessel coronary artery disease. Please note that although the presence of coronary artery calcium documents the presence of coronary artery disease, the severity of this disease and any potential stenosis cannot be assessed on this non-gated CT examination. Assessment for potential risk factor modification, dietary therapy or pharmacologic therapy may be warranted, if clinically indicated. 4. Additional incidental findings, as above.    Genetic Testing   Guardant 360 Results:           CANCER STAGING:  Cancer Staging Prostate cancer [facility]) Staging form: Prostate, AJCC 7th Edition - Pathologic stage from 07/29/2014: Stage III (T3b, N0, cM0, Gleason 7) - Signed by Baird Cancer, PA-C on 11/09/2016 - Clinical: Stage IV (T3, N0, M1, PSA: Less than 10, Gleason 7) - Signed by [name], MD on 12/21/2016 - Pathologic: No stage assigned - Unsigned   INTERVAL HISTORY:   [name], a 64 y.o. male, returns for routine follow-up of his prostate cancer.  He is doing well, complains of some back pain in the lower back, radiating to the hips, 7/10, needs ibuprofen 1-3 a day. He has noticed weak stream of his urine as well. No other complaints, He wondered why he was scheduled today for follow up.  REVIEW OF SYSTEMS:  Review of Systems  Constitutional: Positive for fatigue (75%). Negative for appetite change.  Respiratory: Positive for shortness of breath (w/ activity).   Cardiovascular: Positive for leg swelling (wearing compression socks). Negative for chest  pain.  Endocrine: Positive for hot flashes (after Lupron).  Genitourinary: Positive for frequency (weak stream as well).   Musculoskeletal: Positive for back pain (7/10 lower back pain).  Psychiatric/Behavioral: Positive for sleep disturbance.  All other  systems reviewed and are negative.   PAST MEDICAL/SURGICAL HISTORY:  Past Medical History:  Diagnosis Date  . Acute ST elevation myocardial infarction (STEMI) of inferior wall ([place]) 11/2019  . Asthma   . COPD (chronic obstructive pulmonary disease) ([place])   . Coronary artery disease    DES to mid LAD August 2014 ([facility]); overlapping DESx2 prox-mid RCA 11/2019  . Essential hypertension   . Hyperthyroidism   . NSTEMI (non-ST elevated myocardial infarction) ([facility]) 2014  . Prostate cancer ([place])   . Type 2 diabetes mellitus ([place])    Past Surgical History:  Procedure Laterality Date  . APPENDECTOMY    . CORONARY/GRAFT ACUTE MI REVASCULARIZATION N/A 12/10/2019   Procedure: Coronary/Graft Acute MI Revascularization;  Surgeon: Nelva Bush, MD;  Location: [facility];  Service: Cardiovascular;  Laterality: N/A;  . LEFT HEART CATH AND CORONARY ANGIOGRAPHY N/A 12/10/2019   Procedure: LEFT HEART CATH AND CORONARY ANGIOGRAPHY;  Surgeon: Nelva Bush, MD;  Location: [facility];  Service: Cardiovascular;  Laterality: N/A;  . LYMPHADENECTOMY Bilateral 06/27/2014   Procedure: LYMPHADENECTOMY;  Surgeon: Raynelle Bring, MD;  Location: [facility];  Service: Urology;  Laterality: Bilateral;  . ROBOT ASSISTED LAPAROSCOPIC RADICAL PROSTATECTOMY N/A 06/27/2014   Procedure: ROBOTIC ASSISTED LAPAROSCOPIC RADICAL PROSTATECTOMY LEVEL 3;  Surgeon: Raynelle Bring, MD;  Location: [facility];  Service: Urology;  Laterality: N/A;  . TONSILLECTOMY      SOCIAL HISTORY:  Social History   Socioeconomic History  . Marital status: Married    Spouse name: Not on file  . Number of children: Not on file  . Years of education: Not on file  . Highest education level: Not on file  Occupational History  . Not on file  Tobacco Use  . Smoking status: Former Smoker    Years: 40.00    Types: Cigarettes    Quit date: 06/21/2012    Years since quitting: 7.9  . Smokeless tobacco: Never Used  Vaping Use  . Vaping  Use: Never used  Substance and Sexual Activity  . Alcohol use: Yes    Comment: occasional  . Drug use: No  . Sexual activity: Not on file  Other Topics Concern  . Not on file  Social History Narrative  . Not on file   Social Determinants of Health   Financial Resource Strain: Not on file  Food Insecurity: Not on file  Transportation Needs: No Transportation Needs  . Lack of Transportation (Medical): No  . Lack of Transportation (Non-Medical): No  Physical Activity: Inactive  . Days of Exercise per Week: 0 days  . Minutes of Exercise per Session: 0 min  Stress: Not on file  Social Connections: Not on file  Intimate Partner Violence: Not At Risk  . Fear of Current or Ex-Partner: No  . Emotionally Abused: No  . Physically Abused: No  . Sexually Abused: No    FAMILY HISTORY:  Family History  Problem Relation Age of Onset  . Dementia Mother   . Thyroid disease Mother   . Clotting disorder Mother   . Kidney Stones Father   . Stroke Brother   . Alzheimer's disease Maternal Uncle   . Tuberculosis Paternal Grandmother   . Heart attack Maternal Uncle  x4  . Heart attack Cousin        in his 50s    CURRENT MEDICATIONS:  Current Outpatient Medications  Medication Sig Dispense Refill  . albuterol (PROVENTIL HFA;VENTOLIN HFA) 108 (90 BASE) MCG/ACT inhaler Inhale 1-2 puffs into the lungs every 6 (six) hours as needed for wheezing or shortness of breath.     [name] Kitchen apalutamide (ERLEADA) 60 MG tablet Take 2 tablets (120 mg total) by mouth daily. May be taken with or without food. Swallow tablets whole. 60 tablet 1  . aspirin EC 81 MG tablet Take 81 mg by mouth every other day.    [name] Kitchen atorvastatin (LIPITOR) 40 MG tablet TAKE 1 TABLET BY MOUTH AT BEDTIME. (Patient taking differently: Take 40 mg by mouth at bedtime.) 30 tablet 6  . Calcium Carb-Cholecalciferol (CALCIUM 600 + D PO) Take 1 tablet by mouth 2 (two) times daily.    [name] Kitchen Co-Enzyme Q-10 100 MG CAPS Take 100 mg by mouth daily.     [name] Kitchen denosumab (XGEVA) 120 MG/1.7ML SOLN injection Inject 120 mg into the skin every 30 (thirty) days.    . fluticasone-salmeterol (ADVAIR HFA) 115-21 MCG/ACT inhaler Inhale 2 puffs into the lungs 2 (two) times daily.     [name] Kitchen ibuprofen (ADVIL) 200 MG tablet Take 200 mg by mouth every 6 (six) hours as needed for headache or mild pain.    . Ibuprofen-diphenhydrAMINE HCl (IBUPROFEN PM) 200-25 MG CAPS Take 1 tablet by mouth at bedtime as needed (sleep).    Javier Docker Oil 350 MG CAPS Take 350 mg by mouth daily.    [name] Kitchen Leuprolide Acetate, 3 Month, (LUPRON DEPOT, 61-MONTH, IM) Inject 1 Dose into the muscle every 3 (three) months.    . methimazole (TAPAZOLE) 10 MG tablet Take 10 mg by mouth every other day.     . metoprolol succinate (TOPROL XL) 25 MG 24 hr tablet Take 1 tablet (25 mg total) by mouth daily. 90 tablet 1  . Multiple Vitamin (MULTIVITAMIN) tablet Take 1 tablet by mouth daily.    . nitroGLYCERIN (NITROSTAT) 0.4 MG SL tablet Place 1 tablet (0.4 mg total) under the tongue every 5 (five) minutes as needed. (Patient taking differently: Place 0.4 mg under the tongue every 5 (five) minutes as needed for chest pain.) 25 tablet 12  . Potassium 99 MG TABS Take 99 mg by mouth daily.    [name] Kitchen pyridOXINE (VITAMIN B-6) 100 MG tablet Take 100 mg by mouth daily.    . ticagrelor (BRILINTA) 90 MG TABS tablet Take 1 tablet (90 mg total) by mouth 2 (two) times daily. 60 tablet 11  . Trolamine Salicylate (ASPERCREME EX) Apply 1 application topically daily as needed (pain).     No current facility-administered medications for this visit.    ALLERGIES:  Allergies  Allergen Reactions  . Xtandi [Enzalutamide] Other (See Comments)    "Severe personality change" and blood clots  . Penicillins Hives    PHYSICAL EXAM:  Performance status (ECOG): 1 - Symptomatic but completely ambulatory  Vitals:   05/23/20 1232  BP: 119/64  Pulse: 79  Resp: 18  Temp: 98.3 F (36.8 C)  SpO2: 99%   Wt Readings from Last 3  Encounters:  05/23/20 259 lb 8 oz (117.7 kg)  05/12/20 258 lb 1.6 oz (117.1 kg)  04/18/20 259 lb 4.2 oz (117.6 kg)   Physical Exam Vitals reviewed.  Constitutional:      Appearance: Normal appearance. He is obese.  Cardiovascular:     Rate and  Rhythm: Normal rate and regular rhythm.     Pulses: Normal pulses.     Heart sounds: Normal heart sounds.  Pulmonary:     Effort: Pulmonary effort is normal.     Breath sounds: Normal breath sounds.  Musculoskeletal:     Right lower leg: Edema (trace) present.     Left lower leg: Edema (trace) present.  Neurological:     General: No focal deficit present.     Mental Status: He is alert and oriented to person, place, and time.  Psychiatric:        Mood and Affect: Mood normal.        Behavior: Behavior normal.      LABORATORY DATA:  I have reviewed the labs as listed.  CBC Latest Ref Rng & Units 05/23/2020 05/07/2020 04/09/2020  WBC 4.0 - 10.5 K/uL 8.3 7.8 7.0  Hemoglobin 13.0 - 17.0 g/dL 13.1 13.5 13.6  Hematocrit 39.0 - 52.0 % 37.6(L) 39.6 40.1  Platelets 150 - 400 K/uL 157 158 149(L)   CMP Latest Ref Rng & Units 05/23/2020 05/07/2020 04/09/2020  Glucose 70 - 99 mg/dL 129(H) 117(H) 103(H)  BUN 8 - 23 mg/dL 27(H) 25(H) 24(H)  Creatinine 0.61 - 1.24 mg/dL 1.19 1.33(H) 1.22  Sodium 135 - 145 mmol/L 139 140 137  Potassium 3.5 - 5.1 mmol/L 4.1 4.3 5.1  Chloride 98 - 111 mmol/L 105 108 105  CO2 22 - 32 mmol/L $RemoveB'25 24 25  'ouTwShQj$ Calcium 8.9 - 10.3 mg/dL 9.3 8.9 9.4  Total Protein 6.5 - 8.1 g/dL 6.4(L) 6.6 6.5  Total Bilirubin 0.3 - 1.2 mg/dL 0.6 0.6 0.7  Alkaline Phos 38 - 126 U/L 80 86 86  AST 15 - 41 U/L $Remo'26 26 23  'AdOle$ ALT 0 - 44 U/L $Remo'25 28 24   'HXbif$ PSA 8.67 (H) 05/07/2020  PSA 4.47 (H) 04/09/2020  PSA 1.66 02/13/2020    DIAGNOSTIC IMAGING:  I have independently reviewed the scans and discussed with the patient. No results found.   ASSESSMENT:   1. 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 -Abiraterone 750 mg and prednisone 5 mg started on 06/16/2018. -CTAP and bone scan on 05/23/2019 showed improved response compared to prior scans. -Abiraterone dose increased to 1000 mg daily in the first week of January 2021 as PSA was going up. -CT AP on 11/06/2019 showed unchanged appearance of diffuse sclerotic bone metastasis. No visceral mets. -Bone scan on 11/06/2019 shows sclerotic metastatic disease to the sacrum. Similar to slightly increased activity compared to the previous study could be due to technical factors. No new areas. -Xtandi started around 11/21/2019, discontinued on 12/05/2019 due to mood changes. -ST elevation MI involving the right coronary artery on 12/10/2019, status post cardiac catheterization showing thrombotic occlusion of the proximal RCA, successful PCI with stent placement. -He is on aspirin and Brilinta. -Invitae testing was negative. -Guardant 360 showed AR amplification. No other mutations. -PSA increased to 4.47 on 04/09/2020. -Bicalutamide 50 mg daily started on 04/02/2020. - Now on lupron and X geva.  PLAN:   1. 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 -PSA continues to rise.  Today it is 8.67. -Dr Raliegh Ip talked about initiating him on Erleada as he was not too keen on starting chemotherapy. -However [place] is not compatible with Brilinta.  Darolutamide is not approved in the M1 setting. - Plan was to consider chemotherapy. He is here to talk  about chemotherapy. I recommend repeating scans given PSA increase, re evaluation by Dr Raliegh Ip and then consider treatment.  2. Bone metastasis: -Continue monthly denosumab.    3. CADand stents: -on aspirin and Brilinta.   Orders placed this encounter:  Orders Placed This Encounter  Procedures  . CT CHEST ABDOMEN PELVIS W CONTRAST  . NM Bone Scan Whole Body    Benay Pike MD

## 2020-05-23 NOTE — Patient Instructions (Signed)
[facility] at [facility] Discharge Instructions  You were seen today by Dr. Iruku. Follow up as scheduled.   Thank you for choosing [facility] at [facility] to provide your oncology and hematology care.  To afford each patient quality time with our provider, please arrive at least 15 minutes before your scheduled appointment time.   If you have a lab appointment with the Cancer Center please come in thru the Main Entrance and check in at the main information desk.  You need to re-schedule your appointment should you arrive 10 or more minutes late.  We strive to give you quality time with our providers, and arriving late affects you and other patients whose appointments are after yours.  Also, if you no show three or more times for appointments you may be dismissed from the clinic at the providers discretion.     Again, thank you for choosing [facility].  Our hope is that these requests will decrease the amount of time that you wait before being seen by our physicians.       _____________________________________________________________  Should you have questions after your visit to [facility], please contact our office at [phone] and follow the prompts.  Our office hours are 8:00 a.m. and 4:30 p.m. Monday - Friday.  Please note that voicemails left after 4:00 p.m. may not be returned until the following business day.  We are closed weekends and major holidays.  You do have access to a nurse 24-7, just call the main number to the clinic [phone] and do not press any options, hold on the line and a nurse will answer the phone.    For prescription refill requests, have your pharmacy contact our office and allow 72 hours.    Due to Covid, you will need to wear a mask upon entering the hospital. If you do not have a mask, a mask will be given to you at the Main Entrance upon arrival. For doctor visits, patients may  have 1 support person age 18 or older with them. For treatment visits, patients can not have anyone with them due to social distancing guidelines and our immunocompromised population.     

## 2020-05-26 ENCOUNTER — Inpatient Hospital Stay (HOSPITAL_COMMUNITY): Payer: Medicare Other

## 2020-05-27 ENCOUNTER — Ambulatory Visit (HOSPITAL_COMMUNITY): Admission: RE | Admit: 2020-05-27 | Payer: Medicare Other | Source: Ambulatory Visit

## 2020-05-29 ENCOUNTER — Ambulatory Visit (HOSPITAL_COMMUNITY): Payer: Medicare Other

## 2020-05-29 ENCOUNTER — Ambulatory Visit (HOSPITAL_COMMUNITY): Payer: Medicare Other | Admitting: Hematology and Oncology

## 2020-05-29 ENCOUNTER — Other Ambulatory Visit (HOSPITAL_COMMUNITY): Payer: Medicare Other

## 2020-05-31 ENCOUNTER — Other Ambulatory Visit: Payer: Self-pay | Admitting: Family Medicine

## 2020-06-02 ENCOUNTER — Ambulatory Visit (HOSPITAL_COMMUNITY)
Admission: RE | Admit: 2020-06-02 | Discharge: 2020-06-02 | Disposition: A | Discharge status: Home / Self Care | Payer: Medicare Other | Source: Ambulatory Visit | Attending: Hematology and Oncology | Admitting: Hematology and Oncology

## 2020-06-02 ENCOUNTER — Encounter (HOSPITAL_COMMUNITY): Payer: Self-pay

## 2020-06-02 ENCOUNTER — Other Ambulatory Visit: Payer: Self-pay

## 2020-06-02 DIAGNOSIS — C7951 Secondary malignant neoplasm of bone: Secondary | ICD-10-CM | POA: Diagnosis present

## 2020-06-02 DIAGNOSIS — C61 Malignant neoplasm of prostate: Secondary | ICD-10-CM | POA: Diagnosis present

## 2020-06-02 MED ORDER — IOHEXOL 300 MG/ML  SOLN
150.0000 mL | Freq: Once | INTRAMUSCULAR | Status: AC | PRN
  Administered 2020-06-02: 14:00:00 125 mL via INTRAVENOUS

## 2020-06-02 MED ORDER — TECHNETIUM TC 99M MEDRONATE IV KIT
20.0000 | PACK | Freq: Once | INTRAVENOUS | Status: AC | PRN
  Administered 2020-06-02: 11:00:00 20.4 via INTRAVENOUS

## 2020-06-04 ENCOUNTER — Telehealth: Payer: Self-pay | Admitting: Cardiology

## 2020-06-04 NOTE — Telephone Encounter (Addendum)
Form faxed to [facility] yesterday (06/03/20)    Patient made aware.

## 2020-06-04 NOTE — Telephone Encounter (Signed)
New message    Patient finished cardiac rehab , he would like to get a order for cardiac rehab maintenance.  So that he can continue his rehab maintenance.

## 2020-06-04 NOTE — Telephone Encounter (Signed)
I will forward to [place] for order.

## 2020-06-04 NOTE — Telephone Encounter (Signed)
I think I saw the paperwork for this come through the office yesterday and signed it.  If not, we can get one sent over from cardiac rehab.

## 2020-06-05 ENCOUNTER — Other Ambulatory Visit: Payer: Self-pay

## 2020-06-05 ENCOUNTER — Inpatient Hospital Stay (HOSPITAL_COMMUNITY): Payer: Medicare Other

## 2020-06-05 ENCOUNTER — Other Ambulatory Visit (HOSPITAL_COMMUNITY): Payer: Medicare Other

## 2020-06-05 DIAGNOSIS — C61 Malignant neoplasm of prostate: Secondary | ICD-10-CM

## 2020-06-05 DIAGNOSIS — C7951 Secondary malignant neoplasm of bone: Secondary | ICD-10-CM | POA: Diagnosis not present

## 2020-06-05 LAB — CBC WITH DIFFERENTIAL/PLATELET
Abs Immature Granulocytes: 0.02 10*3/uL (ref 0.00–0.07)
Basophils Absolute: 0 10*3/uL (ref 0.0–0.1)
Basophils Relative: 0 %
Eosinophils Absolute: 0.3 10*3/uL (ref 0.0–0.5)
Eosinophils Relative: 5 %
HCT: 37.5 % — ABNORMAL LOW (ref 39.0–52.0)
Hemoglobin: 13.1 g/dL (ref 13.0–17.0)
Immature Granulocytes: 0 %
Lymphocytes Relative: 30 %
Lymphs Abs: 2 10*3/uL (ref 0.7–4.0)
MCH: 34.1 pg — ABNORMAL HIGH (ref 26.0–34.0)
MCHC: 34.9 g/dL (ref 30.0–36.0)
MCV: 97.7 fL (ref 80.0–100.0)
Monocytes Absolute: 0.6 10*3/uL (ref 0.1–1.0)
Monocytes Relative: 9 %
Neutro Abs: 3.9 10*3/uL (ref 1.7–7.7)
Neutrophils Relative %: 56 %
Platelets: 156 10*3/uL (ref 150–400)
RBC: 3.84 MIL/uL — ABNORMAL LOW (ref 4.22–5.81)
RDW: 13.1 % (ref 11.5–15.5)
WBC: 6.9 10*3/uL (ref 4.0–10.5)
nRBC: 0 % (ref 0.0–0.2)

## 2020-06-05 LAB — PSA: Prostatic Specific Antigen: 12.5 ng/mL — ABNORMAL HIGH (ref 0.00–4.00)

## 2020-06-05 LAB — COMPREHENSIVE METABOLIC PANEL
ALT: 25 U/L (ref 0–44)
AST: 27 U/L (ref 15–41)
Albumin: 3.7 g/dL (ref 3.5–5.0)
Alkaline Phosphatase: 87 U/L (ref 38–126)
Anion gap: 7 (ref 5–15)
BUN: 29 mg/dL — ABNORMAL HIGH (ref 8–23)
CO2: 25 mmol/L (ref 22–32)
Calcium: 9 mg/dL (ref 8.9–10.3)
Chloride: 108 mmol/L (ref 98–111)
Creatinine, Ser: 1.3 mg/dL — ABNORMAL HIGH (ref 0.61–1.24)
GFR, Estimated: >60 mL/min (ref >60)
Glucose, Bld: 135 mg/dL — ABNORMAL HIGH (ref 70–99)
Potassium: 4.4 mmol/L (ref 3.5–5.1)
Sodium: 140 mmol/L (ref 135–145)
Total Bilirubin: 0.7 mg/dL (ref 0.3–1.2)
Total Protein: 6.5 g/dL (ref 6.5–8.1)

## 2020-06-09 ENCOUNTER — Inpatient Hospital Stay (HOSPITAL_COMMUNITY): Payer: Medicare Other | Admitting: Hematology

## 2020-06-09 ENCOUNTER — Other Ambulatory Visit: Payer: Self-pay

## 2020-06-09 ENCOUNTER — Inpatient Hospital Stay (HOSPITAL_COMMUNITY): Payer: Medicare Other

## 2020-06-09 VITALS — BP 106/84 | HR 83 | Temp 97.3°F | Resp 17

## 2020-06-09 DIAGNOSIS — C61 Malignant neoplasm of prostate: Secondary | ICD-10-CM

## 2020-06-09 DIAGNOSIS — C7951 Secondary malignant neoplasm of bone: Secondary | ICD-10-CM | POA: Diagnosis not present

## 2020-06-09 MED ORDER — DENOSUMAB 120 MG/1.7ML ~~LOC~~ SOLN
120.0000 mg | Freq: Once | SUBCUTANEOUS | Status: AC
  Administered 2020-06-09: 13:00:00 120 mg via SUBCUTANEOUS
  Filled 2020-06-09: qty 1.7

## 2020-06-09 NOTE — Progress Notes (Signed)
[place] presents today for injection per the provider's orders.  Xgeva administration without incident to Right Lower Abdomen; injection site WNL; see MAR for injection details.  Patient tolerated procedure well and without incident.  No questions or complaints noted at this time.  Patient discharged ambulatory and in stable condition with family member.

## 2020-06-11 ENCOUNTER — Inpatient Hospital Stay (HOSPITAL_BASED_OUTPATIENT_CLINIC_OR_DEPARTMENT_OTHER): Payer: Medicare Other | Admitting: Hematology

## 2020-06-11 ENCOUNTER — Other Ambulatory Visit: Payer: Self-pay

## 2020-06-11 VITALS — BP 126/59 | HR 69 | Temp 98.4°F | Resp 18 | Wt 258.0 lb

## 2020-06-11 DIAGNOSIS — Z7189 Other specified counseling: Secondary | ICD-10-CM | POA: Insufficient documentation

## 2020-06-11 DIAGNOSIS — C61 Malignant neoplasm of prostate: Secondary | ICD-10-CM

## 2020-06-11 DIAGNOSIS — C7951 Secondary malignant neoplasm of bone: Secondary | ICD-10-CM

## 2020-06-11 NOTE — Progress Notes (Signed)
START ON PATHWAY REGIMEN - Prostate     A cycle is every 21 days:     Docetaxel      Prednisone   **Always confirm dose/schedule in your pharmacy ordering system**  Patient Characteristics: Adenocarcinoma, Recurrent/New Systemic Disease, Castration Resistant, M1, Prior Novel Hormonal Agent, No Molecular Alteration or Targeted Therapy Exhausted, No Prior Docetaxel Histology: Adenocarcinoma Therapeutic Status: Recurrent/New Systemic Disease  Intent of Therapy: Non-Curative / Palliative Intent, Discussed with Patient 

## 2020-06-11 NOTE — Patient Instructions (Addendum)
[facility] at [facility] Discharge Instructions  You were seen today by Dr. Ellin Saba. He went over your recent results. The next step in treatment involves either chemotherapy docetaxel given every 3 weeks for about 5 months, or radioactive substance Xofigo given by IV every 4 weeks for 6 doses. You will be referred to interventional radiology for port placement. Dr. Ellin Saba will see you back 1 week after beginning treatment for labs and follow up.   Thank you for choosing  [facility] Center at [facility] to provide your oncology and hematology care.  To afford each patient quality time with our provider, please arrive at least 15 minutes before your scheduled appointment time.   If you have a lab appointment with the Cancer Center please come in thru the Main Entrance and check in at the main information desk  You need to re-schedule your appointment should you arrive 10 or more minutes late.  We strive to give you quality time with our providers, and arriving late affects you and other patients whose appointments are after yours.  Also, if you no show three or more times for appointments you may be dismissed from the clinic at the providers discretion.     Again, thank you for choosing [facility].  Our hope is that these requests will decrease the amount of time that you wait before being seen by our physicians.       _____________________________________________________________  Should you have questions after your visit to [facility], please contact our office at [phone] between the hours of 8:00 a.m. and 4:30 p.m.  Voicemails left after 4:00 p.m. will not be returned until the following business day.  For prescription refill requests, have your pharmacy contact our office and allow 72 hours.    Cancer Center Support Programs:   > Cancer Support Group  2nd Tuesday of the month 1pm-2pm, Journey Room

## 2020-06-11 NOTE — Progress Notes (Signed)
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 Radical resection of prostate on 06/27/2014. 2. Zytiga from 05/19/2018 to 11/09/2019. 3. Xtandi from 11/21/2019 to 12/05/2019, stopped due to mood changes.  NGS Results: Guardant AR amplification, MSI normal, TMB 16 Muts/Mb  CURRENT THERAPY: Lupron every 3 months, Xgeva monthly  BRIEF ONCOLOGIC HISTORY:  Oncology History  Prostate cancer ([place])  02/06/2014 Procedure   Prostate biopsy by Dr. Clyde Lundborg   02/11/2014 Pathology Results   Prostatic adenocarcinoma identified in 5 of 6 prostate specimens with Gleason pattern showing primary pattern-grade 4, secondary pattern grade 3.  Total Gleason score equals 7.  Proportion of prostatic tissue involved by tumor: 70%.   05/03/2014 Imaging   Bone scan- Negative    06/27/2014 Procedure   Radical resection of prostate by Dr. Raynelle Bring   07/29/2014 Pathology Results   1. Prostate, radical resection - PROSTATIC ADENOCARCINOMA, GLEASON SCORE 4 + 3 = 7. - RIGHT AND LEFT PROSTATE INVOLVED. - RIGHT AND LEFT SEMINAL VESICLES INVOLVED BY TUMOR. - EXTRACAPSULAR EXTENSION BY TUMOR. - TUMOR EXTENDS INTO BLADDER NECK TISSUE. - MARGINS NOT INVOLVED. 2. Lymph nodes, regional resection, left pelvic - THREE BENIGN LYMPH NODES (0/3). 3. Lymph nodes, regional resection, right pelvic - FOUR BENIGN LYMPH NODES (0/4).   11/16/2016 Imaging   Bone scan- New areas of increased uptake superolateral to the left orbit (faint), at S1, and in the posterolateral aspect of the left sixth rib.    11/25/2016 Imaging   CT CAP- 1. Status post radical prostatectomy. No findings to suggest local recurrence of disease or definite extraskeletal metastatic disease in the chest, abdomen or pelvis.  However, there are several osseous lesions, as above, concerning for metastatic disease to the bones. 2. Hepatic steatosis. 3. Aortic atherosclerosis, in addition to 2 vessel coronary artery disease. Please note that although the presence of coronary artery calcium documents the presence of coronary artery disease, the severity of this disease and any potential stenosis cannot be assessed on this non-gated CT examination. Assessment for potential risk factor modification, dietary therapy or pharmacologic therapy may be warranted, if clinically indicated. 4. Additional incidental findings, as above.    Genetic Testing   Guardant 360 Results:           CANCER STAGING: Cancer Staging Prostate cancer [facility]) Staging form: Prostate, AJCC 7th Edition - Pathologic stage from 07/29/2014: Stage III (T3b, N0, cM0, Gleason 7) - Signed by Baird Cancer, PA-C on 11/09/2016 - Clinical: Stage IV (T3, N0, M1, PSA: Less than 10, Gleason 7) - Signed by [name], MD on 12/21/2016 - Pathologic: No stage assigned - Unsigned   INTERVAL HISTORY:  Hunter Jones, a 64 y.o. male, returns for routine follow-up of his prostate cancer. Hence was last seen by Dr. Benay Pike on 05/23/2020.   Today he reports feeling well. He reports having back pain just above his sacrum and he takes an ibuprofen daily to help him sleep.   REVIEW OF SYSTEMS:  Review of Systems  Constitutional: Positive for fatigue (75%). Negative for appetite change.  Respiratory: Positive for cough and shortness of breath.   Musculoskeletal: Positive for arthralgias (5/10 knees pain) and back pain (mid-back above sacrum).  All other systems reviewed and are negative.  PAST MEDICAL/SURGICAL HISTORY:  Past Medical History:  Diagnosis Date  . Acute ST elevation myocardial infarction (STEMI) of inferior wall ([place]) 11/2019  . Asthma   . COPD (chronic obstructive pulmonary disease) ([place])   . Coronary artery disease     DES to mid LAD August 2014 ([facility]); overlapping DESx2 prox-mid RCA 11/2019  . Essential hypertension   . Hyperthyroidism   . NSTEMI (non-ST elevated myocardial infarction) ([place]) 2014  . Prostate cancer ([place] [facility])   . Type 2 diabetes mellitus ([place])    Past Surgical History:  Procedure Laterality Date  . APPENDECTOMY    . CORONARY/GRAFT ACUTE MI REVASCULARIZATION N/A 12/10/2019   Procedure: Coronary/Graft Acute MI Revascularization;  Surgeon: Nelva Bush, MD;  Location: [facility];  Service: Cardiovascular;  Laterality: N/A;  . LEFT HEART CATH AND CORONARY ANGIOGRAPHY N/A 12/10/2019   Procedure: LEFT HEART CATH AND CORONARY ANGIOGRAPHY;  Surgeon: Nelva Bush, MD;  Location: [facility];  Service: Cardiovascular;  Laterality: N/A;  . LYMPHADENECTOMY Bilateral 06/27/2014   Procedure: LYMPHADENECTOMY;  Surgeon: Raynelle Bring, MD;  Location: [facility];  Service: Urology;  Laterality: Bilateral;  . ROBOT ASSISTED LAPAROSCOPIC RADICAL PROSTATECTOMY N/A 06/27/2014   Procedure: ROBOTIC ASSISTED LAPAROSCOPIC RADICAL PROSTATECTOMY LEVEL 3;  Surgeon: Raynelle Bring, MD;  Location: [facility];  Service: Urology;  Laterality: N/A;  . TONSILLECTOMY      SOCIAL HISTORY:  Social History   Socioeconomic History  . Marital status: Married    Spouse name: Not on file  . Number of children: Not on file  . Years of education: Not on file  . Highest education level: Not on file  Occupational History  . Not on file  Tobacco Use  . Smoking status: Former Smoker    Years: 40.00    Types: Cigarettes    Quit date: 06/21/2012    Years since quitting: 7.9  . Smokeless tobacco: Never Used  Vaping Use  . Vaping Use: Never used  Substance and Sexual Activity  . Alcohol use: Yes    Comment: occasional  . Drug use: No  . Sexual activity: Not on file  Other Topics Concern  . Not on file  Social History Narrative  . Not on file   Social Determinants of Health   Financial Resource Strain: Not on  file  Food Insecurity: Not on file  Transportation Needs: No Transportation Needs  . Lack of Transportation (Medical): No  . Lack of Transportation (Non-Medical): No  Physical Activity: Inactive  . Days of Exercise per Week: 0 days  . Minutes of Exercise per Session: 0 min  Stress: Not on file  Social Connections: Not on file  Intimate Partner Violence: Not At Risk  . Fear of Current or Ex-Partner: No  . Emotionally Abused: No  . Physically Abused: No  . Sexually Abused: No    FAMILY HISTORY:  Family History  Problem Relation Age of Onset  . Dementia Mother   . Thyroid disease Mother   . Clotting disorder Mother   . Kidney Stones Father   . Stroke Brother   . Alzheimer's disease Maternal Uncle   . Tuberculosis Paternal Grandmother   . Heart attack Maternal Uncle        x4  . Heart attack Cousin        in his 90s    CURRENT MEDICATIONS:  Current Outpatient Medications  Medication Sig Dispense Refill  . aspirin EC 81 MG tablet Take 81 mg by mouth every other day.    [name] Kitchen  atorvastatin (LIPITOR) 40 MG tablet TAKE 1 TABLET BY MOUTH AT BEDTIME. (Patient taking differently: Take 40 mg by mouth at bedtime.) 30 tablet 6  . Calcium Carb-Cholecalciferol (CALCIUM 600 + D PO) Take 1 tablet by mouth 2 (two) times daily.    [name] Kitchen Co-Enzyme Q-10 100 MG CAPS Take 100 mg by mouth daily.    [name] Kitchen denosumab (XGEVA) 120 MG/1.7ML SOLN injection Inject 120 mg into the skin every 30 (thirty) days.    . fluticasone-salmeterol (ADVAIR HFA) 115-21 MCG/ACT inhaler Inhale 2 puffs into the lungs 2 (two) times daily.     [name] Kitchen ibuprofen (ADVIL) 200 MG tablet Take 200 mg by mouth every 6 (six) hours as needed for headache or mild pain.    . Ibuprofen-diphenhydrAMINE HCl (IBUPROFEN PM) 200-25 MG CAPS Take 1 tablet by mouth at bedtime as needed (sleep).    Hunter Jones Oil 350 MG CAPS Take 350 mg by mouth daily.    [name] Kitchen Leuprolide Acetate, 3 Month, (LUPRON DEPOT, 2-MONTH, IM) Inject 1 Dose into the muscle every 3 (three)  months.    . methimazole (TAPAZOLE) 10 MG tablet Take 10 mg by mouth every other day.     . metoprolol succinate (TOPROL-XL) 25 MG 24 hr tablet TAKE 1 TABLET BY MOUTH EVERY DAY 90 tablet 1  . Multiple Vitamin (MULTIVITAMIN) tablet Take 1 tablet by mouth daily.    . nitroGLYCERIN (NITROSTAT) 0.4 MG SL tablet Place 1 tablet (0.4 mg total) under the tongue every 5 (five) minutes as needed. (Patient taking differently: Place 0.4 mg under the tongue every 5 (five) minutes as needed for chest pain.) 25 tablet 12  . Potassium 99 MG TABS Take 99 mg by mouth daily.    [name] Kitchen pyridOXINE (VITAMIN B-6) 100 MG tablet Take 100 mg by mouth daily.    . ticagrelor (BRILINTA) 90 MG TABS tablet Take 1 tablet (90 mg total) by mouth 2 (two) times daily. 60 tablet 11  . Trolamine Salicylate (ASPERCREME EX) Apply 1 application topically daily as needed (pain).    [name] Kitchen albuterol (PROVENTIL HFA;VENTOLIN HFA) 108 (90 BASE) MCG/ACT inhaler Inhale 1-2 puffs into the lungs every 6 (six) hours as needed for wheezing or shortness of breath.  (Patient not taking: Reported on 06/11/2020)    . apalutamide (ERLEADA) 60 MG tablet Take 2 tablets (120 mg total) by mouth daily. May be taken with or without food. Swallow tablets whole. (Patient not taking: Reported on 06/11/2020) 60 tablet 1   No current facility-administered medications for this visit.    ALLERGIES:  Allergies  Allergen Reactions  . Xtandi [Enzalutamide] Other (See Comments)    "Severe personality change" and blood clots  . Penicillins Hives    PHYSICAL EXAM:  Performance status (ECOG): 1 - Symptomatic but completely ambulatory  Vitals:   06/11/20 1530  BP: (!) 126/59  Pulse: 69  Resp: 18  Temp: 98.4 F (36.9 C)  SpO2: 99%   Wt Readings from Last 3 Encounters:  06/11/20 258 lb (117 kg)  05/23/20 259 lb 8 oz (117.7 kg)  05/12/20 258 lb 1.6 oz (117.1 kg)   Physical Exam Vitals reviewed.  Constitutional:      Appearance: Normal appearance. He is obese.   Neurological:     General: No focal deficit present.     Mental Status: He is alert and oriented to person, place, and time.  Psychiatric:        Mood and Affect: Mood normal.        Behavior:  Behavior normal.      LABORATORY DATA:  I have reviewed the labs as listed.  CBC Latest Ref Rng & Units 06/05/2020 05/23/2020 05/07/2020  WBC 4.0 - 10.5 K/uL 6.9 8.3 7.8  Hemoglobin 13.0 - 17.0 g/dL 13.1 13.1 13.5  Hematocrit 39.0 - 52.0 % 37.5(L) 37.6(L) 39.6  Platelets 150 - 400 K/uL 156 157 158   CMP Latest Ref Rng & Units 06/05/2020 05/23/2020 05/07/2020  Glucose 70 - 99 mg/dL 135(H) 129(H) 117(H)  BUN 8 - 23 mg/dL 29(H) 27(H) 25(H)  Creatinine 0.61 - 1.24 mg/dL 1.30(H) 1.19 1.33(H)  Sodium 135 - 145 mmol/L 140 139 140  Potassium 3.5 - 5.1 mmol/L 4.4 4.1 4.3  Chloride 98 - 111 mmol/L 108 105 108  CO2 22 - 32 mmol/L $RemoveB'25 25 24  'rvYLjlRw$ Calcium 8.9 - 10.3 mg/dL 9.0 9.3 8.9  Total Protein 6.5 - 8.1 g/dL 6.5 6.4(L) 6.6  Total Bilirubin 0.3 - 1.2 mg/dL 0.7 0.6 0.6  Alkaline Phos 38 - 126 U/L 87 80 86  AST 15 - 41 U/L $Remo'27 26 26  'PDHrK$ ALT 0 - 44 U/L $Remo'25 25 28   'uYNZs$ PSA 12.5 (H) 06/05/2020  PSA 10.31 (H) 05/23/2020  PSA 8.67 (H) 05/07/2020    DIAGNOSTIC IMAGING:  I have independently reviewed the scans and discussed with the patient. NM Bone Scan Whole Body  Result Date: 06/03/2020 CLINICAL DATA:  Metastatic prostate cancer. EXAM: NUCLEAR MEDICINE WHOLE BODY BONE SCAN TECHNIQUE: Whole body anterior and posterior images were obtained approximately 3 hours after intravenous injection of radiopharmaceutical. RADIOPHARMACEUTICALS:  20.4 mCi Technetium-28m MDP IV COMPARISON:  CT 11/06/2019.  Bone scan 11/06/2019. FINDINGS: Bilateral renal function excretion. Increased activity is again noted throughout the sacrum, again consistent with metastatic disease. Intensity of activity may be slightly decreased on today's exam. No other bony abnormalities identified. IMPRESSION: Increased activity is again noted  throughout the sacrum, again consistent with metastatic disease. Intensity of activity may be slightly decreased on today's exam. No other bony abnormalities identified. Electronically Signed   By: Marcello Moores  Register   On: 06/03/2020 06:12   CT CHEST ABDOMEN PELVIS W CONTRAST  Result Date: 06/03/2020 CLINICAL DATA:  History of metastatic prostate cancer in this 64 year old male EXAM: CT CHEST, ABDOMEN, AND PELVIS WITH CONTRAST TECHNIQUE: Multidetector CT imaging of the chest, abdomen and pelvis was performed following the standard protocol during bolus administration of intravenous contrast. CONTRAST:  185mL OMNIPAQUE IOHEXOL 300 MG/ML  SOLN COMPARISON:  Nov 06, 2019 FINDINGS: CT CHEST FINDINGS Cardiovascular: Calcified atheromatous plaque, mild in the thoracic aorta. Signs of coronary artery disease with calcification hand with evidence of prior percutaneous coronary intervention of RIGHT and LEFT coronary circulation. Heart size top normal without pericardial effusion. Central pulmonary vasculature normal caliber. Mediastinum/Nodes: Thoracic inlet structures are normal. No axillary lymphadenopathy. No mediastinal lymphadenopathy. Esophagus grossly normal. No hilar adenopathy. Lungs/Pleura: Airways are patent. No consolidation. No pleural effusion. No suspicious pulmonary nodule. Musculoskeletal: Visualized clavicles and scapulae without acute process. Sclerosis of the LEFT scapula (image 8, series 5 Scrotal or CIS of the RIGHT scapula on image 20 of series 5 these areas measuring approximately 1.5 cm. Multifocal sclerotic lesions in the bilateral ribs, for instance lesion in the LEFT third rib posteriorly, dense sclerotic lesions in the fifth and seventh ribs on the LEFT as well with numerous other foci of sclerosis in the bilateral ribs. Process worse on the LEFT than the RIGHT. Scattered foci of sclerosis throughout the spine in the thoracic spine, for instance at  the T4-T5 levels with subcentimeter areas of  sclerosis also at T10 as an example with there is an approximately 1 cm sclerotic lesion in the posterior vertebra on the LEFT. This extends into the pedicle. Scattered small foci at nearly every level in the spine. CT ABDOMEN PELVIS FINDINGS Hepatobiliary: No focal, suspicious hepatic lesion. No pericholecystic stranding. No biliary duct dilation. Portal vein is patent. Pancreas: Pancreas is normal without ductal dilation or sign of inflammation. Spleen: Normal Adrenals/Urinary Tract: Adrenal glands are normal. Mild renal cortical scarring on the RIGHT greater than LEFT. Finding similar to the previous study. No hydronephrosis. Urinary bladder collapsed and mildly thickened post prostatectomy. Stomach/Bowel: No acute gastrointestinal process colonic diverticulosis. Vascular/Lymphatic: Calcified atheromatous plaque, mild in the abdominal aorta without aneurysmal dilation. There is no gastrohepatic or hepatoduodenal ligament lymphadenopathy. No retroperitoneal or mesenteric lymphadenopathy. No pelvic sidewall lymphadenopathy. Reproductive: Post prostatectomy. Other: No ascites. Musculoskeletal: Multifocal bony metastatic disease similar to previous imaging, most notably in the sacrum where there is dense sclerotic change IMPRESSION: 1. Multifocal bony metastatic disease on imaging of the abdomen and pelvis in lumbar spine and sacrum. 2. On today's scan there is involvement of multiple ribs, thoracic spine and bilateral scapular involvement which represents a change when compared to the most recent chest comparison from 2018. 3. Post prostatectomy. 4. Circumferential thickening of a collapsed urinary bladder. Likely post radiation changes as findings are similar to the prior study. 5. Coronary artery disease with evidence of prior percutaneous coronary intervention of RIGHT and LEFT coronary circulation. 6. Aortic atherosclerosis. Aortic Atherosclerosis (ICD10-I70.0). Electronically Signed   By: Zetta Bills M.D.    On: 06/03/2020 15:25     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 -Abiraterone and prednisone from 06/16/2018 through 11/06/2019 with progression. -Xtandi started around 11/21/2019, discontinued on 12/05/2019 due to mood changes. -ST elevation MI involving the right coronary artery on 12/10/2019, status post cardiac catheterization showing thrombotic occlusion of the proximal RCA, successful PCI with stent placement. -[employer] testing was negative for germline mutations. -Guardant 360 showed AR amplification. No other mutations. -Bicalutamide 50 mg daily from 04/02/2020 through 05/12/2020 with progression. -Bone scan on 06/02/2020 with increased activity throughout the sacrum with no other bony abnormalities. -CT CAP from 06/02/2020 with no visceral metastatic disease. -[place] is not compatible with Brilinta.   PLAN:  1. 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 -Latest PSA has increased to 12.5. -As Erleada was not compatible with Brilinta, I have recommended chemotherapy with docetaxel. -We will likely limit the chemotherapy to around 6 cycles and see how the response is.  If he gets excellent response, we may choose to wait and watch at that point based on his preference.  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  He wants to take a break for the winter and start chemotherapy. -We will also consider radium-223 if there is any progression at that point. -We discussed side effects of docetaxel.  We will also start him on prednisone 5 mg daily. -He will need port placement. -I will see him back 1 week after the docetaxel to evaluate for any toxicities.  2. Bone metastasis: -He will continue monthly denosumab.  3.Elevated blood sugars: -Continue Jardiance.  4. CADand stents: -Continue aspirin and Brilinta.   Orders placed this encounter:  Orders Placed  This Encounter  Procedures  . IR IMAGING GUIDED PORT INSERTION  Rim Thatch, MD [facility] [phone]   I, Daniel Khashchuk, am acting as a scribe for Dr. Randie Tallarico Katagadda.  I, Malakie Balis MD, have reviewed the above documentation for accuracy and completeness, and I agree with the above.     

## 2020-06-16 NOTE — Patient Instructions (Signed)
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  You will be treated in the clinic every 3 weeks with a chemotherapy drug called docetaxel (Taxotere).  The intent of treatment is to control this cancer, prevent it from spreading further, and to help alleviate any symptoms you may be having related to your disease.  You will see the doctor regularly throughout treatment. We will obtain blood work from you prior to every treatment and monitor your results to make sure it is safe to give your treatment. The doctor monitors your response to treatment by the way you are feeling, your blood work, and by obtaining scans periodically.  There will be wait times while you are here for treatment.  It will take about 30 minutes to 1 hour for your lab work to result.  Then there will be wait times while pharmacy mixes your medications.   Medications you will receive in the clinic prior to your chemotherapy medications:  Aloxi:  ALOXI is used in adults to help prevent the nausea and vomiting that happens with certain anti-cancer medicines (chemotherapy).  Aloxi is a long acting medication, and will remain in your system for about 2 days.   Dexamethasone:  This is a steroid given prior to chemotherapy to help prevent allergic reactions; it may also help prevent and control nausea and diarrhea.     Docetaxel (Taxotere)  About This Drug Docetaxel is used to treat cancer. It is given in the vein (IV).  It will take 1 hour to infuse.  The first infusion will take longer than 1 hour (about 1 hour 45 minutes) as we start the initial infusion at a very slow rate and titrate it up gradually to ensure you tolerate the drug without adverse reactions.   Possible Side Effects . Bone marrow suppression. This is a decrease in the number of white blood cells, red blood cells, and platelets. This may raise your risk of infection, make you tired and weak (fatigue),  and raise your risk of bleeding.  . Fever in the setting of decreased white blood cells, which is a serious condition that can be life threatening  . Soreness of the mouth and throat. You may have red areas, white patches, or sores that hurt.  . Nausea and vomiting (throwing up)  . Constipation (not able to move bowels)  . Diarrhea (loose bowel movements)  . Infections  . Swelling of your legs, ankles, and/or feet  . Changes in the way food and drinks taste  . Effects on the nerves are called peripheral neuropathy. You may feel numbness, tingling, or pain in your hands and feet. It may be hard for you to button your clothes, open jars, or walk as usual. The effect on the nerves may get worse with more doses of the drug. These effects get better in some people after the drug is stopped but it does not get better in all people.  . Decreased appetite (decreased hunger)  . Weakness  . Pain  . Muscle pain/aching  . Trouble breathing  . Changes in your nail color, you may have nail loss and/or brittle nail  . Hair loss. Hair loss is often temporary, although there have been cases of permanent hair loss reported. Hair loss may happen suddenly or gradually. If you lose hair, you may lose it from your head, face, armpits, pubic area, chest, and/or legs. You may also notice your hair getting thin.  .   Allergic skin reaction. You may develop blisters on your skin that are filled with fluid or a severe red rash all over your body that may be painful.  . Allergic reactions, including anaphylaxis are rare but may happen in some patients. Signs of allergic reaction to this drug may be swelling of the face, feeling like your tongue or throat are swelling, trouble breathing, rash, itching, fever, chills, feeling dizzy, and/or feeling that your heart is beating in a fast or not normal way. If this happens, do not take another dose of this drug. You should get urgent medical treatment.  Note: Not  all possible side effects are included above.  Warnings and Precautions . Severe bone marrow suppression  . Severe allergic reactions, including anaphylaxis which can be life-threatening.  . Swelling (inflammation) in the colon in the setting of severely low white blood cells, which raises your risk of infection and can be life-threatening  . Severe swelling in the eye or other changes in eyesight  . Severe swelling of your legs, ankles and/or feet. Sometimes, fluid can build up in your lungs and/or around your heart causing you trouble breathing.  . If you have a history of abnormal liver function, receive high doses of docetaxel, or have a history of lung cancer and have received treatment with a platinum (type of chemotherapy medication), you have an increased risk of death  . Severe weakness  . This drug may raise your risk of getting a second cancer such as leukemia and myelodysplastic syndrome  . Severe peripheral neuropathy and/or numbness, tingling or a sensation of pins and needles in your arms, hands, legs or feet  . This drug contains alcohol and may affect your central nervous system. The central nervous system is made up of your brain and spinal cord. You may feel drunk during and after your treatment and it can impair your ability to drive or use machinery for one to two hours after infusion.   Note: Some of the side effects above are very rare. If you have concerns and/or questions, please discuss them with your medical team.  Important Information . This drug may be present in the saliva, tears, sweat, urine, stool, vomit, semen, and vaginal secretions. Talk to your doctor and/or your nurse about the necessary precautions to take during this time.  Treating Side Effects . Manage tiredness by pacing your activities for the day.  . Be sure to include periods of rest between energy-draining activities.  . Get regular exercise. If you feel too tired to exercise  vigorously, try taking a short walk.  . To decrease the risk of infection, wash your hands regularly.  . Avoid close contact with people who have a cold, the flu, or other infections.  . Take your temperature as your doctor or nurse tells you, and whenever you feel like you may have a fever.  . To help decrease bleeding, use a soft toothbrush. Check with your nurse before using dental floss.  . Be very careful when using knives or tools.  . Use an electric shaver instead of a razor.  . Mouth care is very important. Your mouth care should consist of routine, gentle cleaning of your teeth or dentures and rinsing your mouth with a mixture of 1/2 teaspoon of salt in 8 ounces of water or 1/2 teaspoon of baking soda in 8 ounces of water. This should be done at least after each meal and at bedtime.  . If you have mouth sores, avoid   mouthwash that has alcohol. Also avoid alcohol and smoking because they can bother your mouth and throat.  . Ask your doctor or nurse about medicines that are available to help stop or lessen constipation and/or diarrhea.  . If you are not able to move your bowels, check with your doctor or nurse before you use enemas, laxatives, or suppositories.  . Drink plenty of fluids (a minimum of eight glasses per day is recommended).  . If you throw up or have loose bowel movements, you should drink more fluids so that you do not become dehydrated (lack of water in the body from losing too much fluid). . If you get diarrhea, eat low-fiber foods that are high in protein and calories and avoid foods that can irritate your digestive tracts or lead to cramping.  . To help with nausea and vomiting, eat small, frequent meals instead of three large meals a day. Choose foods and drinks that are at room temperature. Ask your nurse or doctor about other helpful tips and medicine that is available to help or stop lessen these symptoms. . To help with decreased appetite, eat foods high  in calories and protein, such as meat, poultry, fish, dry beans, tofu, eggs, nuts, milk, yogurt, cheese, ice cream, pudding, and nutritional supplements. . Consider using sauces and spices to increase taste. Daily exercise, with your doctor's approval, may increase your appetite.  . Keeping your pain under control is important to your well-being. Please tell your doctor or nurse if you are experiencing pain.  . If you get a rash do not put anything on it unless your doctor or nurse says you may. Keep the area around the rash clean and dry. Ask your doctor for medicine if your rash bothers you.  . Keeping your nails moisturized may help with brittleness.  . To help with hair loss, wash with a mild shampoo and avoid washing your hair every day.  . Avoid rubbing your scalp, pat your hair or scalp dry.  . Avoid coloring your hair.  . Limit your use of hair spray, electric curlers, blow dryers, and curling irons.  . If you are interested in getting a wig, talk to your nurse. You can also call the [employer] at 800-ACS-2345 to find out information about the "Look Good, Feel Better" program close to where you live. It is a free program where women getting chemotherapy can learn about wigs, turbans and scarves as well as makeup techniques and skin and nail care.  . If you have numbness and tingling in your hands and feet, be careful when cooking, walking, and handling sharp objects and hot liquids.  Food and Drug Interactions . There are no known interactions of docetaxel with food.  . This drug may interact with other medicines. Tell your doctor and pharmacist about all the prescription and over-the-counter medicines and dietary supplements (vitamins, minerals, herbs and others) that you are taking at this time. Also, check with your doctor or pharmacist before starting any new prescription or over-the-counter medicines, or dietary supplements to make sure that there are no  interactions.  When to Call the Doctor Call your doctor or nurse if you have any of these symptoms and/or any new or unusual symptoms: . Fever of 100.4 F (38 C) or higher  . Chills  . Blurred vision or other changes in eyesight  . Easy bruising or bleeding  . Wheezing or trouble breathing  . Feeling dizzy or lightheaded  . Fatigue that   interferes with your daily activities  . Pain in your mouth or throat that makes it hard to eat or drink  . Nausea that stops you from eating or drinking and/or is not relieved by prescribed medicines  . Throwing up   Lasting loss of appetite or rapid weight loss of five pounds in a week  . Diarrhea, 4 times in one day or diarrhea with lack of strength or a feeling of being dizzy  . No bowel movement in 3 days or when you feel uncomfortable  . Severe abdominal pain that does not go away  . Numbness, tingling, or pain your hands and feet  . Swelling of legs, ankles, or feet  . Weight gain of 5 pounds in one week (fluid retention)  . Extreme weakness that interferes with normal activities  . New rash and/or itching  . Rash that is not relieved by prescribed medicines  . Signs of allergic reaction: swelling of the face, feeling like your tongue or throat are swelling, trouble breathing, rash, itching, fever, chills, feeling dizzy, and/or feeling that your heart is beating in a fast or not normal way  . Signs of possible liver problems: dark urine, pale bowel movements, bad stomach pain, feeling very tired and weak, unusual itching, or yellowing of the eyes or skin  . Symptoms of being drunk, confusion, or being very sleepy  . If you think you may be pregnant  Reproduction Warnings . Pregnancy warning: This drug can have harmful effects on the unborn baby. Women of childbearing potential should use effective methods of birth control during your cancer treatment. Let your doctor know right away if you think you may be pregnant.  .  Breastfeeding warning: It is not known if this drug passes into breast milk. For this reason, women should talk to their doctor about the risks and benefits of breastfeeding during treatment with this drug because this drug may enter the breast milk and cause harm to a breastfeeding baby.   . Fertility warning: Human fertility studies have not been done with this drug. Talk with your doctor or nurse if you plan to have children. Ask for information on sperm or egg banking.   Pegfilgrastim-xxxx (Neulasta, Neulasta Onpro, FulphilaT, UdenycaT)  About This Drug  Pegfilgrastim-xxxx belongs to a class of medicines called granulocyte colony-stimulating factor (G-CSF). G-CSF helps the body make more white blood cells. White blood cells help fight infection in your body. This drug is given as an injection under the skin (subcutaneously).  Possible Side Effects . Bone pain . Pain in your arms and/or legs  Note: Each of the side effects above was reported in 5% or greater of patients treated with pegfilgrastim-xxxx. Not all possible side effects are included above.  Warnings and Precautions . Enlargement and inflammation (swelling) of your spleen, which can very rarely rupture and be lifethreatening. Signs of enlargement may be left-sided pain in your abdomen and/or shoulder.  . Trouble breathing because of fluid build-up around your lungs and/or inflammation (swelling) of the lungs.  . Allergic reactions, including anaphylaxis are rare but may happen in some patients. Signs of allergic reaction to this drug may be swelling of the face, feeling like your tongue or throat are swelling, trouble breathing, rash, itching, fever, chills, feeling dizzy, and/or feeling that your heart is beating in a fast or not normal way. If this happens, do not take another dose of this drug. You should get urgent medical treatment.  . Sickle cell crisis in   sickle cell patients treated with pegfilgrastim-xxxx  .  Changes in your kidney function  . A rapid increase in your white blood cells may happen  . A syndrome where fluid and protein can leak from your blood vessels into your tissues. This can cause a decrease in your blood protein level and blood pressure and fluid can accumulate in your tissues and/or lungs.  . The on-body injector uses acrylic adhesive. Caution should be used if you have an allergy to acrylic adhesives.  . Missed or partial doses of the medication have been reported from the on-body injector device not working properly. This could increase your risk of developing severely low white blood cells, which puts you at a risk of severe and life-threatening infections. If you have any problems with the on-body injector, contact your doctor and/or nurse right away.  . Inflammation of the aorta- symptoms may include fever, abdominal pain, back pain and feeling tired.  Note: Some of the side effects above are very rare. If you have concerns and/or questions, please discuss them with your medical team.  Important Information . If you have the on-body injector (OBI) placed, avoid activities such as traveling, driving, or operating heavy machinery during hours 26-29 after it is placed.  How to Take Your Medication . Talk to your doctor, nurse and/or pharmacist for proper preparation, dosing, administration. It is important that you follow the instructions and precautions closely.  Treating Side Effects . Keeping your pain under control is important to your well-being. Please tell your doctor or nurse if you are experiencing pain.  Food and Drug Interactions . There are no known interactions of pegfilgrastim-xxxx with food.  . Tell your doctor and pharmacist about all the prescription and over-the-counter medicines and dietary supplements (vitamins, minerals, herbs and others) that you are taking at this time. Also, check with your doctor or pharmacist before starting any new prescription  or over-the-counter medicines, or dietary supplements to make sure that there are no interactions.  When to Call the Doctor Call your doctor or nurse if you have any of these symptoms and/or any new or unusual symptoms: . Fever of 100.4 F (38 C) or higher  . Chills  . Cough  . Wheezing and/or trouble breathing  . Feeling dizzy or lightheaded  . Tiredness that interferes with your daily activities  . Pain in the left side of the abdomen and/or shoulder pain  . Back pain  . Decreased urine, or very dark urine  . Pain that does not go away or is not relieved by prescribed medicine  . Swelling of legs, ankles, or feet  . Signs of allergic reaction: swelling of the face, feeling like your tongue or throat are swelling, trouble breathing, rash, itching, fever, chills, feeling dizzy, and/or feeling that your heart is beating in a fast or not normal way. If this happens, call 911 for emergency care.  . if you think you may be pregnant  Reproduction Warnings . Pregnancy warning: It is not known if this drug may harm an unborn child. For this reason, be sure to talk with your doctor if you are pregnant or planning to become pregnant while receiving this drug. Let your doctor know right away if you think you may be pregnant.  . Breastfeeding warning: It is not known if this drug passes into breast milk. For this reason, women should talk to their doctor about the risks and benefits of breastfeeding during treatment with this drug because this drug may   enter the breast milk and cause harm to a breastfeeding baby.  . Fertility warning: Human fertility studies have not been done with this drug. Talk with your doctor or nurse if you plan to have children. Ask for information on sperm or egg banking.   SELF CARE ACTIVITIES WHILE RECEIVING CHEMOTHERAPY:  Hydration Increase your fluid intake 48 hours prior to treatment and drink at least 8 to 12 cups (64 ounces) of water/decaffeinated  beverages per day after treatment. You can still have your cup of coffee or soda but these beverages do not count as part of your 8 to 12 cups that you need to drink daily. No alcohol intake.  Medications Continue taking your normal prescription medication as prescribed.  If you start any new herbal or new supplements please let us know first to make sure it is safe.  Mouth Care Have teeth cleaned professionally before starting treatment. Keep dentures and partial plates clean. Use soft toothbrush and do not use mouthwashes that contain alcohol. Biotene is a good mouthwash that is available at most pharmacies or may be ordered by calling [phone]. Use warm salt water gargles (1 teaspoon salt per 1 quart warm water) before and after meals and at bedtime. If you need dental work, please let the doctor know before you go for your appointment so that we can coordinate the best possible time for you in regards to your chemo regimen. You need to also let your dentist know that you are actively taking chemo. We may need to do labs prior to your dental appointment.  Skin Care Always use sunscreen that has not expired and with SPF (Sun Protection Factor) of 50 or higher. Wear hats to protect your head from the sun. Remember to use sunscreen on your hands, ears, face, & feet.  Use good moisturizing lotions such as udder cream, eucerin, or even Vaseline. Some chemotherapies can cause dry skin, color changes in your skin and nails.    . Avoid long, hot showers or baths. . Use gentle, fragrance-free soaps and laundry detergent. . Use moisturizers, preferably creams or ointments rather than lotions because the thicker consistency is better at preventing skin dehydration. Apply the cream or ointment within 15 minutes of showering. Reapply moisturizer at night, and moisturize your hands every time after you wash them.  Hair Loss (if your doctor says your hair will fall out)  . If your doctor says that your  hair is likely to fall out, decide before you begin chemo whether you want to wear a wig. You may want to shop before treatment to match your hair color. . Hats, turbans, and scarves can also camouflage hair loss, although some people prefer to leave their heads uncovered. If you go bare-headed outdoors, be sure to use sunscreen on your scalp. . Cut your hair short. It eases the inconvenience of shedding lots of hair, but it also can reduce the emotional impact of watching your hair fall out. . Don't perm or color your hair during chemotherapy. Those chemical treatments are already damaging to hair and can enhance hair loss. Once your chemo treatments are done and your hair has grown back, it's OK to resume dyeing or perming hair.  With chemotherapy, hair loss is almost always temporary. But when it grows back, it may be a different color or texture. In older adults who still had hair color before chemotherapy, the new growth may be completely gray.  Often, new hair is very fine and soft.    Infection Prevention Please wash your hands for at least 30 seconds using warm soapy water. Handwashing is the #1 way to prevent the spread of germs. Stay away from sick people or people who are getting over a cold. If you develop respiratory systems such as green/yellow mucus production or productive cough or persistent cough let us know and we will see if you need an antibiotic. It is a good idea to keep a pair of gloves on when going into grocery stores/[employer] to decrease your risk of coming into contact with germs on the carts, etc. Carry alcohol hand gel with you at all times and use it frequently if out in public. If your temperature reaches 100.4 or higher please call the clinic and let us know.  If it is after hours or on the weekend please go to the ER if your temperature is over 100.4.  Please have your own personal thermometer at home to use.    Sex and bodily fluids If you are going to have sex, a condom  must be used to protect the person that isn't taking chemotherapy. Chemo can decrease your libido (sex drive). For a few days after chemotherapy, chemotherapy can be excreted through your bodily fluids.  When using the toilet please close the lid and flush the toilet twice.  Do this for a few day after you have had chemotherapy.   Effects of chemotherapy on your sex life Some changes are simple and won't last long. They won't affect your sex life permanently.  Sometimes you may feel: . too tired . not strong enough to be very active . sick or sore  . not in the mood . anxious or low  Your anxiety might not seem related to sex. For example, you may be worried about the cancer and how your treatment is going. Or you may be worried about money, or about how you family are coping with your illness.  These things can cause stress, which can affect your interest in sex. It's important to talk to your partner about how you feel.  Remember - the changes to your sex life don't usually last long. There's usually no medical reason to stop having sex during chemo. The drugs won't have any long term physical effects on your performance or enjoyment of sex. Cancer can't be passed on to your partner during sex  Contraception It's important to use reliable contraception during treatment. Avoid getting pregnant while you or your partner are having chemotherapy. This is because the drugs may harm the baby. Sometimes chemotherapy drugs can leave a man or woman infertile.  This means you would not be able to have children in the future. You might want to talk to someone about permanent infertility. It can be very difficult to learn that you may no longer be able to have children. Some people find counselling helpful. There might be ways to preserve your fertility, although this is easier for men than for women. You may want to speak to a fertility expert. You can talk about sperm banking or harvesting your eggs. You can  also ask about other fertility options, such as donor eggs. If you have or have had breast cancer, your doctor might advise you not to take the contraceptive pill. This is because the hormones in it might affect the cancer. It is not known for sure whether or not chemotherapy drugs can be passed on through semen or secretions from the vagina. Because of this some doctors advise people   to use a barrier method if you have sex during treatment. This applies to vaginal, anal or oral sex. Generally, doctors advise a barrier method only for the time you are actually having the treatment and for about a week after your treatment. Advice like this can be worrying, but this does not mean that you have to avoid being intimate with your partner. You can still have close contact with your partner and continue to enjoy sex.  Animals If you have cats or birds we just ask that you not change the litter or change the cage.  Please have someone else do this for you while you are on chemotherapy.   Food Safety During and After Cancer Treatment Food safety is important for people both during and after cancer treatment. Cancer and cancer treatments, such as chemotherapy, radiation therapy, and stem cell/bone marrow transplantation, often weaken the immune system. This makes it harder for your body to protect itself from foodborne illness, also called food poisoning. Foodborne illness is caused by eating food that contains harmful bacteria, parasites, or viruses.  Foods to avoid Some foods have a higher risk of becoming tainted with bacteria. These include: . Unwashed fresh fruit and vegetables, especially leafy vegetables that can hide dirt and other contaminants . Raw sprouts, such as alfalfa sprouts . Raw or undercooked beef, especially ground beef, or other raw or undercooked meat and poultry . Fatty, fried, or spicy foods immediately before or after treatment.  These can sit heavy on your stomach and make you feel  nauseous. . Raw or undercooked shellfish, such as oysters. . Sushi and sashimi, which often contain raw fish.  . Unpasteurized beverages, such as unpasteurized fruit juices, raw milk, raw yogurt, or cider . Undercooked eggs, such as soft boiled, over easy, and poached; raw, unpasteurized eggs; or foods made with raw egg, such as homemade raw cookie dough and homemade mayonnaise  Simple steps for food safety  Shop smart. . Do not buy food stored or displayed in an unclean area. . Do not buy bruised or damaged fruits or vegetables. . Do not buy cans that have cracks, dents, or bulges. . Pick up foods that can spoil at the end of your shopping trip and store them in a cooler on the way home.  Prepare and clean up foods carefully. . Rinse all fresh fruits and vegetables under running water, and dry them with a clean towel or paper towel. . Clean the top of cans before opening them. . After preparing food, wash your hands for 20 seconds with hot water and soap. Pay special attention to areas between fingers and under nails. . Clean your utensils and dishes with hot water and soap. . Disinfect your kitchen and cutting boards using 1 teaspoon of liquid, unscented bleach mixed into 1 quart of water.    Dispose of old food. . Eat canned and packaged food before its expiration date (the "use by" or "best before" date). . Consume refrigerated leftovers within 3 to 4 days. After that time, throw out the food. Even if the food does not smell or look spoiled, it still may be unsafe. Some bacteria, such as Listeria, can grow even on foods stored in the refrigerator if they are kept for too long.  Take precautions when eating out. . At restaurants, avoid buffets and salad bars where food sits out for a long time and comes in contact with many people. Food can become contaminated when someone with a virus, often a   norovirus, or another "bug" handles it. . Put any leftover food in a "to-go" container  yourself, rather than having the server do it. And, refrigerate leftovers as soon as you get home. . Choose restaurants that are clean and that are willing to prepare your food as you order it cooked.   AT HOME MEDICATIONS:                                                                                                                                                                Compazine/Prochlorperazine 10mg tablet. Take 1 tablet every 6 hours as needed for nausea/vomiting. (This can make you sleepy)   EMLA cream. Apply a quarter size amount to port site 1 hour prior to chemo. Do not rub in. Cover with plastic wrap.    Diarrhea Sheet   If you are having loose stools/diarrhea, please purchase Imodium and begin taking as outlined:  At the first sign of poorly formed or loose stools you should begin taking Imodium (loperamide) 2 mg capsules.  Take two tablets (4mg) followed by one tablet (2mg) every 2 hours - DO NOT EXCEED 8 tablets in 24 hours.  If it is bedtime and you are having loose stools, take 2 tablets at bedtime, then 2 tablets every 4 hours until morning.   Always call the Cancer Center if you are having loose stools/diarrhea that you can't get under control.  Loose stools/diarrhea leads to dehydration (loss of water) in your body.  We have other options of trying to get the loose stools/diarrhea to stop but you must let us know!   Constipation Sheet  Colace - 100 mg capsules - take 2 capsules daily.  If this doesn't help then you can increase to 2 capsules twice daily.  Please call if the above does not work for you. Do not go more than 2 days without a bowel movement.  It is very important that you do not become constipated.  It will make you feel sick to your stomach (nausea) and can cause abdominal pain and vomiting.  Nausea Sheet   Compazine/Prochlorperazine 10mg tablet. Take 1 tablet every 6 hours as needed for nausea/vomiting (This can make you drowsy).  If you are  having persistent nausea (nausea that does not stop) please call the Cancer Center and let us know the amount of nausea that you are experiencing.  If you begin to vomit, you need to call the Cancer Center and if it is the weekend and you have vomited more than one time and can't get it to stop-go to the Emergency Room.  Persistent nausea/vomiting can lead to dehydration (loss of fluid in your body) and will make you feel very weak and unwell. Ice chips, sips of clear liquids, foods that are at room   temperature, crackers, and toast tend to be better tolerated.   SYMPTOMS TO REPORT AS SOON AS POSSIBLE AFTER TREATMENT:  FEVER GREATER THAN 100.4 F  CHILLS WITH OR WITHOUT FEVER  NAUSEA AND VOMITING THAT IS NOT CONTROLLED WITH YOUR NAUSEA MEDICATION  UNUSUAL SHORTNESS OF BREATH  UNUSUAL BRUISING OR BLEEDING  TENDERNESS IN MOUTH AND THROAT WITH OR WITHOUT PRESENCE OF ULCERS  URINARY PROBLEMS  BOWEL PROBLEMS  UNUSUAL RASH      Wear comfortable clothing and clothing appropriate for easy access to any Portacath or PICC line. Let us know if there is anything that we can do to make your therapy better!    What to do if you need assistance after hours or on the weekends: CALL [phone].  HOLD on the line, do not hang up.  You will hear multiple messages but at the end you will be connected with a nurse triage line.  They will contact the doctor if necessary.  Most of the time they will be able to assist you.  Do not call the hospital operator.      I have been informed and understand all of the instructions given to me and have received a copy. I have been instructed to call the clinic [phone] or my family physician as soon as possible for continued medical care, if indicated. I do not have any more questions at this time but understand that I may call the Cancer Center or the Patient Navigator at [phone] during office hours should I have questions or need assistance in  obtaining follow-up care.        

## 2020-06-17 ENCOUNTER — Other Ambulatory Visit: Payer: Self-pay

## 2020-06-17 ENCOUNTER — Inpatient Hospital Stay (HOSPITAL_COMMUNITY): Payer: Medicare Other | Attending: Hematology

## 2020-06-17 ENCOUNTER — Encounter (HOSPITAL_COMMUNITY): Payer: Self-pay

## 2020-06-17 DIAGNOSIS — E119 Type 2 diabetes mellitus without complications: Secondary | ICD-10-CM | POA: Insufficient documentation

## 2020-06-17 DIAGNOSIS — Z7982 Long term (current) use of aspirin: Secondary | ICD-10-CM | POA: Insufficient documentation

## 2020-06-17 DIAGNOSIS — Z79899 Other long term (current) drug therapy: Secondary | ICD-10-CM | POA: Insufficient documentation

## 2020-06-17 DIAGNOSIS — Z5189 Encounter for other specified aftercare: Secondary | ICD-10-CM | POA: Insufficient documentation

## 2020-06-17 DIAGNOSIS — Z7984 Long term (current) use of oral hypoglycemic drugs: Secondary | ICD-10-CM | POA: Insufficient documentation

## 2020-06-17 DIAGNOSIS — Z5111 Encounter for antineoplastic chemotherapy: Secondary | ICD-10-CM | POA: Insufficient documentation

## 2020-06-17 DIAGNOSIS — I2510 Atherosclerotic heart disease of native coronary artery without angina pectoris: Secondary | ICD-10-CM | POA: Insufficient documentation

## 2020-06-17 DIAGNOSIS — C7951 Secondary malignant neoplasm of bone: Secondary | ICD-10-CM | POA: Insufficient documentation

## 2020-06-17 DIAGNOSIS — C61 Malignant neoplasm of prostate: Secondary | ICD-10-CM | POA: Insufficient documentation

## 2020-06-17 DIAGNOSIS — Z87891 Personal history of nicotine dependence: Secondary | ICD-10-CM | POA: Insufficient documentation

## 2020-06-17 DIAGNOSIS — Z7902 Long term (current) use of antithrombotics/antiplatelets: Secondary | ICD-10-CM | POA: Insufficient documentation

## 2020-06-17 DIAGNOSIS — Z95828 Presence of other vascular implants and grafts: Secondary | ICD-10-CM

## 2020-06-17 DIAGNOSIS — I252 Old myocardial infarction: Secondary | ICD-10-CM | POA: Insufficient documentation

## 2020-06-17 HISTORY — DX: Presence of other vascular implants and grafts: Z95.828

## 2020-06-17 MED ORDER — LIDOCAINE-PRILOCAINE 2.5-2.5 % EX CREA: TOPICAL_CREAM | CUTANEOUS | 3 refills | Status: DC

## 2020-06-17 MED ORDER — PROCHLORPERAZINE MALEATE 10 MG PO TABS: 10.0000 mg | ORAL_TABLET | Freq: Four times a day (QID) | ORAL | 1 refills | Status: DC | PRN

## 2020-06-17 MED ORDER — PREDNISONE 5 MG PO TABS: 5.0000 mg | ORAL_TABLET | Freq: Every day | ORAL | 11 refills | Status: DC

## 2020-06-17 NOTE — Progress Notes (Signed)
Chemotherapy education packet given and discussed with pt in detail. Discussed diagnosis and staging, tx regimen, and intent of tx.  Reviewed chemotherapy medications and side effects, as well as pre-medications. Instructed on how to manage side effects at home, and when to call the clinic. Importance of fever/chills discussed with pt. Discussed precautions to implement at home after receiving tx, as well as self care strategies. Phone numbers provided for clinic during regular working hours, also how to reach the clinic after hours and on weekends. Pt and family provided the opportunity to ask questions - all questions answered to pt's satisfaction.   

## 2020-06-18 ENCOUNTER — Other Ambulatory Visit: Payer: Self-pay | Admitting: Radiology

## 2020-06-18 NOTE — Progress Notes (Signed)
.   Pharmacist Chemotherapy Monitoring - Initial Assessment    Anticipated start date: 06/23/20   Regimen:  . Are orders appropriate based on the patient's diagnosis, regimen, and cycle? Yes . Does the plan date match the patient's scheduled date? Yes . Is the sequencing of drugs appropriate? Yes . Are the premedications appropriate for the patient's regimen? Yes . Prior Authorization for treatment is: Approved o If applicable, is the correct biosimilar selected based on the patient's insurance? not applicable  Organ Function and Labs: [name] Kitchen Are dose adjustments needed based on the patient's renal function, hepatic function, or hematologic function? No . Are appropriate labs ordered prior to the start of patient's treatment? Yes . Other organ system assessment, if indicated: N/A . The following baseline labs, if indicated, have been ordered: N/A  Dose Assessment: . Are the drug doses appropriate? Yes . Are the following correct: o Drug concentrations Yes o IV fluid compatible with drug Yes o Administration routes Yes o Timing of therapy Yes . If applicable, does the patient have documented access for treatment and/or plans for port-a-cath placement? not applicable . If applicable, have lifetime cumulative doses been properly documented and assessed? yes Lifetime Dose Tracking  No doses have been documented on this patient for the following tracked chemicals: Doxorubicin, Epirubicin, Idarubicin, Daunorubicin, Mitoxantrone, Bleomycin, Oxaliplatin, Carboplatin, Liposomal Doxorubicin  o   Toxicity Monitoring/Prevention: . The patient has the following take home antiemetics prescribed: Prochlorperazine . The patient has the following take home medications prescribed: N/A . Medication allergies and previous infusion related reactions, if applicable, have been reviewed and addressed. Yes . The patient's current medication list has been assessed for drug-drug interactions with their  chemotherapy regimen. no significant drug-drug interactions were identified on review.  Order Review: . Are the treatment plan orders signed? No . Is the patient scheduled to see a provider prior to their treatment? Yes  I verify that I have reviewed each item in the above checklist and answered each question accordingly.  Stephens Shire 06/18/2020 3:30 PM

## 2020-06-18 NOTE — H&P (Addendum)
Chief Complaint: Metastatic  Prostate cancer  Referring Physician(s): Ellin Saba  Supervising Physician: Richarda Overlie  Patient Status: [facility] - Out-pt  History of Present Illness: Hunter Jones is a 65 y.o. 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  Initial diagnosis was in June 2018.  He was treated with prostatectomy and has been on Abiraterone and prednisone from 06/16/2018 through 11/06/2019 with progression of disease.  Recent visit with Dr. Ellin Saba he c/o sacral pain.  CT done on 06/03/20- 1. Multifocal bony metastatic disease on imaging of the abdomen and pelvis in lumbar spine and sacrum. 2. On today's scan there is involvement of multiple ribs, thoracic spine and bilateral scapular involvement which represents a change when compared to the most recent chest comparison from 2018.  Dr. Ellin Saba has recommended chemotherapy with docetaxel.  He is here today for placement of a tunneled catheter with port.  He is NPO. No nausea/vomiting. No Fever/chills. ROS negative.   Past Medical History:  Diagnosis Date  . Acute ST elevation myocardial infarction (STEMI) of inferior wall (HCC) 11/2019  . Asthma   . COPD (chronic obstructive pulmonary disease) (HCC)   . Coronary artery disease    DES to mid LAD August 2014 ([facility]); overlapping DESx2 prox-mid RCA 11/2019  . Essential hypertension   . Hyperthyroidism   . NSTEMI (non-ST elevated myocardial infarction) (HCC) 2014  . Port-A-Cath in place 06/17/2020  . Prostate cancer (HCC)   . Type 2 diabetes mellitus (HCC)     Past Surgical History:  Procedure Laterality Date  . APPENDECTOMY    . CORONARY/GRAFT ACUTE MI REVASCULARIZATION N/A 12/10/2019   Procedure: Coronary/Graft Acute MI Revascularization;  Surgeon: Yvonne Kendall, MD;  Location: [facility] INVASIVE CV LAB;  Service: Cardiovascular;  Laterality: N/A;  . LEFT HEART CATH AND CORONARY ANGIOGRAPHY N/A 12/10/2019   Procedure: LEFT HEART CATH AND CORONARY  ANGIOGRAPHY;  Surgeon: Yvonne Kendall, MD;  Location: [facility] INVASIVE CV LAB;  Service: Cardiovascular;  Laterality: N/A;  . LYMPHADENECTOMY Bilateral 06/27/2014   Procedure: LYMPHADENECTOMY;  Surgeon: Heloise Purpura, MD;  Location: [facility] ORS;  Service: Urology;  Laterality: Bilateral;  . ROBOT ASSISTED LAPAROSCOPIC RADICAL PROSTATECTOMY N/A 06/27/2014   Procedure: ROBOTIC ASSISTED LAPAROSCOPIC RADICAL PROSTATECTOMY LEVEL 3;  Surgeon: Heloise Purpura, MD;  Location: [facility] ORS;  Service: Urology;  Laterality: N/A;  . TONSILLECTOMY      Allergies: Xtandi [enzalutamide] and Penicillins  Medications: Prior to Admission medications   Medication Sig Start Date End Date Taking? Authorizing Provider  albuterol (PROVENTIL HFA;VENTOLIN HFA) 108 (90 BASE) MCG/ACT inhaler Inhale 1-2 puffs into the lungs every 6 (six) hours as needed for wheezing or shortness of breath.   Yes [provider]  Ascorbic Acid (VITAMIN C PO) Take 1 tablet by mouth daily.   Yes [provider]  aspirin EC 81 MG tablet Take 81 mg by mouth 3 (three) times a week.   Yes [provider]  atorvastatin (LIPITOR) 40 MG tablet TAKE 1 TABLET BY MOUTH AT BEDTIME. Patient taking differently: Take 40 mg by mouth at bedtime. 06/14/19  Yes Jonelle Sidle, MD  Calcium Carb-Cholecalciferol (HM CALCIUM-VITAMIN D) 600-800 MG-UNIT TABS Take 1 tablet by mouth 2 (two) times daily.   Yes [provider]  Coenzyme Q10 (COQ10) 200 MG CAPS Take 200 mg by mouth daily.   Yes [provider]  denosumab (XGEVA) 120 MG/1.7ML SOLN injection Inject 120 mg into the skin every 30 (thirty) days.   Yes [provider]  fluticasone-salmeterol (ADVAIR HFA) 115-21 MCG/ACT inhaler Inhale 2 puffs into the lungs 2 (two) times daily.    Yes [provider]  ibuprofen (ADVIL) 200 MG tablet Take 200 mg by mouth every 6 (six) hours as needed for headache or mild pain.   Yes [provider]   Ibuprofen-diphenhydrAMINE HCl (IBUPROFEN PM) 200-25 MG CAPS Take 1 tablet by mouth at bedtime as needed (sleep).   Yes [provider]  KRILL OIL PO Take 1 capsule by mouth daily.   Yes [provider]  Leuprolide Acetate, 3 Month, (LUPRON DEPOT, 20-MONTH, IM) Inject 1 Dose into the muscle every 3 (three) months.   Yes [provider]  Lutein 20 MG CAPS Take 20 mg by mouth daily.   Yes [provider]  methimazole (TAPAZOLE) 10 MG tablet Take 10 mg by mouth every other day.    Yes [provider]  metoprolol succinate (TOPROL-XL) 25 MG 24 hr tablet TAKE 1 TABLET BY MOUTH EVERY DAY Patient taking differently: Take 25 mg by mouth daily. 06/02/20  Yes Verta Ellen., NP  Multiple Vitamin (MULTIVITAMIN) tablet Take 1 tablet by mouth daily.   Yes [provider]  nitroGLYCERIN (NITROSTAT) 0.4 MG SL tablet Place 1 tablet (0.4 mg total) under the tongue every 5 (five) minutes as needed. Patient taking differently: Place 0.4 mg under the tongue every 5 (five) minutes as needed for chest pain. 12/12/19  Yes End, Harrell Gave, MD  Potassium 99 MG TABS Take 99 mg by mouth daily.   Yes [provider]  ticagrelor (BRILINTA) 90 MG TABS tablet Take 1 tablet (90 mg total) by mouth 2 (two) times daily. 12/12/19  Yes End, Harrell Gave, MD  Trolamine Salicylate (ASPERCREME EX) Apply 1 application topically daily as needed (pain).   Yes [provider]  apalutamide (ERLEADA) 60 MG tablet Take 2 tablets (120 mg total) by mouth daily. May be taken with or without food. Swallow tablets whole. Patient not taking: No sig reported 05/12/20   Derek Jack, MD  DOCETAXEL IV Inject into the vein every 21 ( twenty-one) days. 06/23/20   [provider]  lidocaine-prilocaine (EMLA) cream Apply a small amount to port a cath site and cover with plastic wrap 1 hour prior to chemotherapy appointments 06/17/20   Derek Jack, MD  predniSONE  (DELTASONE) 5 MG tablet Take 1 tablet (5 mg total) by mouth daily with breakfast. 06/17/20   Derek Jack, MD  prochlorperazine (COMPAZINE) 10 MG tablet Take 1 tablet (10 mg total) by mouth every 6 (six) hours as needed (Nausea or vomiting). 06/17/20   Derek Jack, MD     Family History  Problem Relation Age of Onset  . Dementia Mother   . Thyroid disease Mother   . Clotting disorder Mother   . Kidney Stones Father   . Stroke Brother   . Alzheimer's disease Maternal Uncle   . Tuberculosis Paternal Grandmother   . Heart attack Maternal Uncle        x4  . Heart attack Cousin        in his 82s    Social History   Socioeconomic History  . Marital status: Married    Spouse name: Not on file  . Number of children: Not on file  . Years of education: Not on file  . Highest education level: Not on file  Occupational History  . Not on file  Tobacco Use  . Smoking status: Former Smoker    Years: 40.00  Types: Cigarettes    Quit date: 06/21/2012    Years since quitting: 8.0  . Smokeless tobacco: Never Used  Vaping Use  . Vaping Use: Never used  Substance and Sexual Activity  . Alcohol use: Yes    Comment: occasional  . Drug use: No  . Sexual activity: Not on file  Other Topics Concern  . Not on file  Social History Narrative  . Not on file   Social Determinants of Health   Financial Resource Strain: Not on file  Food Insecurity: Not on file  Transportation Needs: No Transportation Needs  . Lack of Transportation (Medical): No  . Lack of Transportation (Non-Medical): No  Physical Activity: Inactive  . Days of Exercise per Week: 0 days  . Minutes of Exercise per Session: 0 min  Stress: Not on file  Social Connections: Not on file     Review of Systems: A 12 point ROS discussed and pertinent positives are indicated in the HPI above.  All other systems are negative.  Review of Systems  Vital Signs: BP 123/76   Pulse 81   Temp 98.2 F (36.8 C) (Oral)    Resp 19   Ht 5\' 11"  (1.803 m)   Wt 117 kg   SpO2 100%   BMI 35.98 kg/m   Physical Exam Vitals reviewed.  Constitutional:      Appearance: Normal appearance.  HENT:     Head: Normocephalic and atraumatic.  Eyes:     Extraocular Movements: Extraocular movements intact.  Cardiovascular:     Rate and Rhythm: Normal rate and regular rhythm.  Pulmonary:     Effort: Pulmonary effort is normal. No respiratory distress.     Breath sounds: Normal breath sounds.  Abdominal:     General: There is no distension.     Palpations: Abdomen is soft.     Tenderness: There is no abdominal tenderness.  Musculoskeletal:        General: Normal range of motion.     Cervical back: Normal range of motion.  Skin:    General: Skin is warm and dry.  Neurological:     General: No focal deficit present.     Mental Status: He is alert and oriented to person, place, and time.  Psychiatric:        Mood and Affect: Mood normal.        Behavior: Behavior normal.        Thought Content: Thought content normal.        Judgment: Judgment normal.     Imaging: NM Bone Scan Whole Body  Result Date: 06/03/2020 CLINICAL DATA:  Metastatic prostate cancer. EXAM: NUCLEAR MEDICINE WHOLE BODY BONE SCAN TECHNIQUE: Whole body anterior and posterior images were obtained approximately 3 hours after intravenous injection of radiopharmaceutical. RADIOPHARMACEUTICALS:  20.4 mCi Technetium-65m MDP IV COMPARISON:  CT 11/06/2019.  Bone scan 11/06/2019. FINDINGS: Bilateral renal function excretion. Increased activity is again noted throughout the sacrum, again consistent with metastatic disease. Intensity of activity may be slightly decreased on today's exam. No other bony abnormalities identified. IMPRESSION: Increased activity is again noted throughout the sacrum, again consistent with metastatic disease. Intensity of activity may be slightly decreased on today's exam. No other bony abnormalities identified. Electronically  Signed   By: Marcello Moores  Register   On: 06/03/2020 06:12   CT CHEST ABDOMEN PELVIS W CONTRAST  Result Date: 06/03/2020 CLINICAL DATA:  History of metastatic prostate cancer in this 65 year old male EXAM: CT CHEST, ABDOMEN, AND PELVIS WITH CONTRAST  TECHNIQUE: Multidetector CT imaging of the chest, abdomen and pelvis was performed following the standard protocol during bolus administration of intravenous contrast. CONTRAST:  148mL OMNIPAQUE IOHEXOL 300 MG/ML  SOLN COMPARISON:  Nov 06, 2019 FINDINGS: CT CHEST FINDINGS Cardiovascular: Calcified atheromatous plaque, mild in the thoracic aorta. Signs of coronary artery disease with calcification hand with evidence of prior percutaneous coronary intervention of RIGHT and LEFT coronary circulation. Heart size top normal without pericardial effusion. Central pulmonary vasculature normal caliber. Mediastinum/Nodes: Thoracic inlet structures are normal. No axillary lymphadenopathy. No mediastinal lymphadenopathy. Esophagus grossly normal. No hilar adenopathy. Lungs/Pleura: Airways are patent. No consolidation. No pleural effusion. No suspicious pulmonary nodule. Musculoskeletal: Visualized clavicles and scapulae without acute process. Sclerosis of the LEFT scapula (image 8, series 5 Scrotal or CIS of the RIGHT scapula on image 20 of series 5 these areas measuring approximately 1.5 cm. Multifocal sclerotic lesions in the bilateral ribs, for instance lesion in the LEFT third rib posteriorly, dense sclerotic lesions in the fifth and seventh ribs on the LEFT as well with numerous other foci of sclerosis in the bilateral ribs. Process worse on the LEFT than the RIGHT. Scattered foci of sclerosis throughout the spine in the thoracic spine, for instance at the T4-T5 levels with subcentimeter areas of sclerosis also at T10 as an example with there is an approximately 1 cm sclerotic lesion in the posterior vertebra on the LEFT. This extends into the pedicle. Scattered small foci at  nearly every level in the spine. CT ABDOMEN PELVIS FINDINGS Hepatobiliary: No focal, suspicious hepatic lesion. No pericholecystic stranding. No biliary duct dilation. Portal vein is patent. Pancreas: Pancreas is normal without ductal dilation or sign of inflammation. Spleen: Normal Adrenals/Urinary Tract: Adrenal glands are normal. Mild renal cortical scarring on the RIGHT greater than LEFT. Finding similar to the previous study. No hydronephrosis. Urinary bladder collapsed and mildly thickened post prostatectomy. Stomach/Bowel: No acute gastrointestinal process colonic diverticulosis. Vascular/Lymphatic: Calcified atheromatous plaque, mild in the abdominal aorta without aneurysmal dilation. There is no gastrohepatic or hepatoduodenal ligament lymphadenopathy. No retroperitoneal or mesenteric lymphadenopathy. No pelvic sidewall lymphadenopathy. Reproductive: Post prostatectomy. Other: No ascites. Musculoskeletal: Multifocal bony metastatic disease similar to previous imaging, most notably in the sacrum where there is dense sclerotic change IMPRESSION: 1. Multifocal bony metastatic disease on imaging of the abdomen and pelvis in lumbar spine and sacrum. 2. On today's scan there is involvement of multiple ribs, thoracic spine and bilateral scapular involvement which represents a change when compared to the most recent chest comparison from 2018. 3. Post prostatectomy. 4. Circumferential thickening of a collapsed urinary bladder. Likely post radiation changes as findings are similar to the prior study. 5. Coronary artery disease with evidence of prior percutaneous coronary intervention of RIGHT and LEFT coronary circulation. 6. Aortic atherosclerosis. Aortic Atherosclerosis (ICD10-I70.0). Electronically Signed   By: Zetta Bills M.D.   On: 06/03/2020 15:25    Labs:  CBC: Recent Labs    04/09/20 1109 05/07/20 1406 05/23/20 1051 06/05/20 1058  WBC 7.0 7.8 8.3 6.9  HGB 13.6 13.5 13.1 13.1  HCT 40.1 39.6  37.6* 37.5*  PLT 149* 158 157 156    COAGS: No results for input(s): INR, APTT in the last 8760 hours.  BMP: Recent Labs    12/19/19 1235 01/16/20 1208 02/13/20 1112 03/13/20 1227 04/09/20 1109 05/07/20 1406 05/23/20 1051 06/05/20 1058  NA 139 140 141 139 137 140 139 140  K 4.8 4.4 4.8 4.3 5.1 4.3 4.1 4.4  CL 105 107 107  107 105 108 105 108  CO2 24 24 24 24 25 24 25 25   GLUCOSE 120* 139* 93 133* 103* 117* 129* 135*  BUN 35* 22 28* 31* 24* 25* 27* 29*  CALCIUM 9.3 9.4 9.4 9.1 9.4 8.9 9.3 9.0  CREATININE 1.50* 1.29* 1.25* 1.34* 1.22 1.33* 1.19 1.30*  GFRNONAA 48* 58* >60 55* >60 59* >60 >60  GFRAA 56* >60 >60 >60  --   --   --   --     LIVER FUNCTION TESTS: Recent Labs    04/09/20 1109 05/07/20 1406 05/23/20 1051 06/05/20 1058  BILITOT 0.7 0.6 0.6 0.7  AST 23 26 26 27   ALT 24 28 25 25   ALKPHOS 86 86 80 87  PROT 6.5 6.6 6.4* 6.5  ALBUMIN 3.7 3.9 3.9 3.7    TUMOR MARKERS: No results for input(s): AFPTM, CEA, CA199, CHROMGRNA in the last 8760 hours.  Assessment and Plan:  Metastatic prostate cancer to bone.  Will proceed with placement of a tunneled catheter with port today by Dr Anselm Pancoast.  Risks and benefits of image guided port-a-catheter placement was discussed with the patient including, but not limited to bleeding, infection, pneumothorax, or fibrin sheath development and need for additional procedures.  All of the patient's questions were answered, patient is agreeable to proceed. Consent signed and in chart.  Thank you for this interesting consult.  I greatly enjoyed meeting Hunter Jones and look forward to participating in their care.  A copy of this report was sent to the requesting provider on this date.  Electronically Signed: Murrell Redden, PA-C   06/19/2020, 8:42 AM      I spent a total of  30 Minutes   in face to face in clinical consultation, greater than 50% of which was counseling/coordinating care for port a cath placement.

## 2020-06-19 ENCOUNTER — Encounter (HOSPITAL_COMMUNITY): Payer: Self-pay

## 2020-06-19 ENCOUNTER — Other Ambulatory Visit: Payer: Self-pay

## 2020-06-19 ENCOUNTER — Other Ambulatory Visit (HOSPITAL_COMMUNITY): Payer: Self-pay | Admitting: Hematology

## 2020-06-19 ENCOUNTER — Ambulatory Visit (HOSPITAL_COMMUNITY)
Admission: RE | Admit: 2020-06-19 | Discharge: 2020-06-19 | Disposition: A | Discharge status: Home / Self Care | Payer: Medicare Other | Source: Ambulatory Visit | Attending: Hematology | Admitting: Hematology

## 2020-06-19 DIAGNOSIS — Z7952 Long term (current) use of systemic steroids: Secondary | ICD-10-CM | POA: Diagnosis not present

## 2020-06-19 DIAGNOSIS — Z79899 Other long term (current) drug therapy: Secondary | ICD-10-CM | POA: Diagnosis not present

## 2020-06-19 DIAGNOSIS — C7951 Secondary malignant neoplasm of bone: Secondary | ICD-10-CM | POA: Diagnosis not present

## 2020-06-19 DIAGNOSIS — Z7951 Long term (current) use of inhaled steroids: Secondary | ICD-10-CM | POA: Diagnosis not present

## 2020-06-19 DIAGNOSIS — Z880 Allergy status to penicillin: Secondary | ICD-10-CM | POA: Insufficient documentation

## 2020-06-19 DIAGNOSIS — Z7982 Long term (current) use of aspirin: Secondary | ICD-10-CM | POA: Insufficient documentation

## 2020-06-19 DIAGNOSIS — C61 Malignant neoplasm of prostate: Secondary | ICD-10-CM

## 2020-06-19 HISTORY — PX: IR IMAGING GUIDED PORT INSERTION: IMG5740

## 2020-06-19 HISTORY — PX: IR US GUIDE VASC ACCESS RIGHT: IMG2390

## 2020-06-19 LAB — GLUCOSE, CAPILLARY: Glucose-Capillary: 102 mg/dL — ABNORMAL HIGH (ref 70–99)

## 2020-06-19 MED ORDER — LIDOCAINE-EPINEPHRINE 1 %-1:100000 IJ SOLN
INTRAMUSCULAR | Status: AC | PRN
  Administered 2020-06-19: 10 mL

## 2020-06-19 MED ORDER — MIDAZOLAM HCL 2 MG/2ML IJ SOLN
INTRAMUSCULAR | Status: AC | PRN
Start: 2020-06-19 — End: 2020-06-19
  Administered 2020-06-19: 0.5 mg via INTRAVENOUS
  Administered 2020-06-19: 1 mg via INTRAVENOUS

## 2020-06-19 MED ORDER — FENTANYL CITRATE (PF) 100 MCG/2ML IJ SOLN
INTRAMUSCULAR | Status: AC | PRN
  Administered 2020-06-19 (×2): 25 ug via INTRAVENOUS

## 2020-06-19 MED ORDER — LIDOCAINE-EPINEPHRINE 1 %-1:100000 IJ SOLN
INTRAMUSCULAR | Status: AC
  Filled 2020-06-19: qty 1

## 2020-06-19 MED ORDER — LIDOCAINE HCL (PF) 1 % IJ SOLN
INTRAMUSCULAR | Status: AC | PRN
  Administered 2020-06-19: 5 mL

## 2020-06-19 MED ORDER — VANCOMYCIN HCL IN DEXTROSE 1-5 GM/200ML-% IV SOLN
INTRAVENOUS | Status: AC
  Administered 2020-06-19: 1000 mg via INTRAVENOUS
  Filled 2020-06-19: qty 200

## 2020-06-19 MED ORDER — LIDOCAINE HCL 1 % IJ SOLN
INTRAMUSCULAR | Status: AC
  Filled 2020-06-19: qty 20

## 2020-06-19 MED ORDER — VANCOMYCIN HCL IN DEXTROSE 1-5 GM/200ML-% IV SOLN: 1000.0000 mg | INTRAVENOUS | Status: AC

## 2020-06-19 MED ORDER — HEPARIN SOD (PORK) LOCK FLUSH 100 UNIT/ML IV SOLN
INTRAVENOUS | Status: AC | PRN
  Administered 2020-06-19: 500 [IU] via INTRAVENOUS

## 2020-06-19 MED ORDER — FENTANYL CITRATE (PF) 100 MCG/2ML IJ SOLN
INTRAMUSCULAR | Status: AC
  Filled 2020-06-19: qty 2

## 2020-06-19 MED ORDER — HEPARIN SOD (PORK) LOCK FLUSH 100 UNIT/ML IV SOLN
INTRAVENOUS | Status: AC
  Filled 2020-06-19: qty 5

## 2020-06-19 MED ORDER — MIDAZOLAM HCL 2 MG/2ML IJ SOLN
INTRAMUSCULAR | Status: AC
  Filled 2020-06-19: qty 2

## 2020-06-19 MED ORDER — SODIUM CHLORIDE 0.9 % IV SOLN: INTRAVENOUS | Status: DC

## 2020-06-19 NOTE — Procedures (Signed)
Interventional Radiology Procedure: ° ° °Indications: Prostate cancer ° °Procedure: Port placement ° °Findings: Right jugular port, tip at SVC/RA junction ° °Complications: None °    °EBL: Minimal, less than 10 ml ° °Plan: Discharge in one hour.  Keep port site and incisions dry for at least 24 hours.   ° ° °Hunter Jones R. Zuly Belkin, MD  °Pager: [phone] ° °  °

## 2020-06-19 NOTE — Discharge Instructions (Addendum)
Implanted Port Insertion, Care After This sheet gives you information about how to care for yourself after your procedure. Your health care provider may also give you more specific instructions. If you have problems or questions, contact your health care provider. What can I expect after the procedure? After the procedure, it is common to have:  Discomfort at the port insertion site.  Bruising on the skin over the port. This should improve over 3-4 days. Follow these instructions at home: Port care  After your port is placed, you will get a manufacturer's information card. The card has information about your port. Keep this card with you at all times.  Take care of the port as told by your health care provider. Ask your health care provider if you or a family member can get training for taking care of the port at home. A home health care nurse may also take care of the port.  Make sure to remember what type of port you have. Incision care      Follow instructions from your health care provider about how to take care of your port insertion site. Make sure you: ? Wash your hands with soap and water before and after you change your bandage (dressing). If soap and water are not available, use hand sanitizer. ? Change your dressing as told by your health care provider. ? Leave stitches (sutures), skin glue, or adhesive strips in place. These skin closures may need to stay in place for 2 weeks or longer. If adhesive strip edges start to loosen and curl up, you may trim the loose edges. Do not remove adhesive strips completely unless your health care provider tells you to do that.  Check your port insertion site every day for signs of infection. Check for: ? Redness, swelling, or pain. ? Fluid or blood. ? Warmth. ? Pus or a bad smell. Activity  Return to your normal activities as told by your health care provider. Ask your health care provider what activities are safe for you.  Do not  lift anything that is heavier than 10 lb (4.5 kg), or the limit that you are told, until your health care provider says that it is safe. General instructions  Take over-the-counter and prescription medicines only as told by your health care provider.  Do not take baths, swim, or use a hot tub until your health care provider approves. Ask your health care provider if you may take showers. You may only be allowed to take sponge baths.  Do not drive for 24 hours if you were given a sedative during your procedure.  Wear a medical alert bracelet in case of an emergency. This will tell any health care providers that you have a port.  Keep all follow-up visits as told by your health care provider. This is important. Contact a health care provider if:  You cannot flush your port with saline as directed, or you cannot draw blood from the port.  You have a fever or chills.  You have redness, swelling, or pain around your port insertion site.  You have fluid or blood coming from your port insertion site.  Your port insertion site feels warm to the touch.  You have pus or a bad smell coming from the port insertion site. Get help right away if:  You have chest pain or shortness of breath.  You have bleeding from your port that you cannot control. Summary  Take care of the port as told by your health   care provider. Keep the manufacturer's information card with you at all times.  Change your dressing as told by your health care provider.  Contact a health care provider if you have a fever or chills or if you have redness, swelling, or pain around your port insertion site.  Keep all follow-up visits as told by your health care provider. This information is not intended to replace advice given to you by your health care provider. Make sure you discuss any questions you have with your health care provider. Document Revised: 12/27/2017 Document Reviewed: 12/27/2017 Elsevier Patient Education   2020 [employer] Moderate Conscious Sedation, Adult Sedation is the use of medicines to promote relaxation and relieve discomfort and anxiety. Moderate conscious sedation is a type of sedation. Under moderate conscious sedation, you are less alert than normal, but you are still able to respond to instructions, touch, or both. Moderate conscious sedation is used during short medical and dental procedures. It is milder than deep sedation, which is a type of sedation under which you cannot be easily woken up. It is also milder than general anesthesia, which is the use of medicines to make you unconscious. Moderate conscious sedation allows you to return to your regular activities sooner. Tell a health care provider about:  Any allergies you have.  All medicines you are taking, including vitamins, herbs, eye drops, creams, and over-the-counter medicines.  Use of steroids (by mouth or creams).  Any problems you or family members have had with sedatives and anesthetic medicines.  Any blood disorders you have.  Any surgeries you have had.  Any medical conditions you have, such as sleep apnea.  Whether you are pregnant or may be pregnant.  Any use of cigarettes, alcohol, marijuana, or street drugs. What are the risks? Generally, this is a safe procedure. However, problems may occur, including:  Getting too much medicine (oversedation).  Nausea.  Allergic reaction to medicines.  Trouble breathing. If this happens, a breathing tube may be used to help with breathing. It will be removed when you are awake and breathing on your own.  Heart trouble.  Lung trouble. What happens before the procedure? Staying hydrated Follow instructions from your health care provider about hydration, which may include:  Up to 2 hours before the procedure - you may continue to drink clear liquids, such as water, clear fruit juice, black coffee, and plain tea. Eating and drinking restrictions Follow  instructions from your health care provider about eating and drinking, which may include:  8 hours before the procedure - stop eating heavy meals or foods such as meat, fried foods, or fatty foods.  6 hours before the procedure - stop eating light meals or foods, such as toast or cereal.  6 hours before the procedure - stop drinking milk or drinks that contain milk.  2 hours before the procedure - stop drinking clear liquids. Medicine Ask your health care provider about:  Changing or stopping your regular medicines. This is especially important if you are taking diabetes medicines or blood thinners.  Taking medicines such as aspirin and ibuprofen. These medicines can thin your blood. Do not take these medicines before your procedure if your health care provider instructs you not to.  Tests and exams  You will have a physical exam.  You may have blood tests done to show: ? How well your kidneys and liver are working. ? How well your blood can clot. General instructions  Plan to have someone take you home from the   hospital or clinic.  If you will be going home right after the procedure, plan to have someone with you for 24 hours. What happens during the procedure?  An IV tube will be inserted into one of your veins.  Medicine to help you relax (sedative) will be given through the IV tube.  The medical or dental procedure will be performed. What happens after the procedure?  Your blood pressure, heart rate, breathing rate, and blood oxygen level will be monitored often until the medicines you were given have worn off.  Do not drive for 24 hours. This information is not intended to replace advice given to you by your health care provider. Make sure you discuss any questions you have with your health care provider. Document Revised: 05/13/2017 Document Reviewed: 09/20/2015 Elsevier Patient Education  2020 [employer] Inc.  

## 2020-06-20 ENCOUNTER — Encounter (HOSPITAL_COMMUNITY): Payer: Self-pay

## 2020-06-23 ENCOUNTER — Encounter (HOSPITAL_COMMUNITY): Payer: Self-pay

## 2020-06-23 ENCOUNTER — Inpatient Hospital Stay (HOSPITAL_COMMUNITY): Payer: Medicare Other

## 2020-06-23 ENCOUNTER — Other Ambulatory Visit: Payer: Self-pay

## 2020-06-23 VITALS — BP 136/65 | HR 77 | Temp 96.8°F | Resp 18

## 2020-06-23 DIAGNOSIS — Z87891 Personal history of nicotine dependence: Secondary | ICD-10-CM | POA: Diagnosis not present

## 2020-06-23 DIAGNOSIS — Z5111 Encounter for antineoplastic chemotherapy: Secondary | ICD-10-CM | POA: Diagnosis present

## 2020-06-23 DIAGNOSIS — I252 Old myocardial infarction: Secondary | ICD-10-CM | POA: Diagnosis not present

## 2020-06-23 DIAGNOSIS — Z7902 Long term (current) use of antithrombotics/antiplatelets: Secondary | ICD-10-CM | POA: Diagnosis not present

## 2020-06-23 DIAGNOSIS — C61 Malignant neoplasm of prostate: Secondary | ICD-10-CM | POA: Diagnosis present

## 2020-06-23 DIAGNOSIS — E119 Type 2 diabetes mellitus without complications: Secondary | ICD-10-CM | POA: Diagnosis not present

## 2020-06-23 DIAGNOSIS — Z7982 Long term (current) use of aspirin: Secondary | ICD-10-CM | POA: Diagnosis not present

## 2020-06-23 DIAGNOSIS — C7951 Secondary malignant neoplasm of bone: Secondary | ICD-10-CM | POA: Diagnosis present

## 2020-06-23 DIAGNOSIS — Z5189 Encounter for other specified aftercare: Secondary | ICD-10-CM | POA: Diagnosis not present

## 2020-06-23 DIAGNOSIS — Z95828 Presence of other vascular implants and grafts: Secondary | ICD-10-CM

## 2020-06-23 DIAGNOSIS — Z79899 Other long term (current) drug therapy: Secondary | ICD-10-CM | POA: Diagnosis not present

## 2020-06-23 DIAGNOSIS — I2510 Atherosclerotic heart disease of native coronary artery without angina pectoris: Secondary | ICD-10-CM | POA: Diagnosis not present

## 2020-06-23 DIAGNOSIS — Z7984 Long term (current) use of oral hypoglycemic drugs: Secondary | ICD-10-CM | POA: Diagnosis not present

## 2020-06-23 LAB — CBC WITH DIFFERENTIAL/PLATELET
Abs Immature Granulocytes: 0.02 10*3/uL (ref 0.00–0.07)
Basophils Absolute: 0 10*3/uL (ref 0.0–0.1)
Basophils Relative: 0 %
Eosinophils Absolute: 0.4 10*3/uL (ref 0.0–0.5)
Eosinophils Relative: 5 %
HCT: 36.4 % — ABNORMAL LOW (ref 39.0–52.0)
Hemoglobin: 12.7 g/dL — ABNORMAL LOW (ref 13.0–17.0)
Immature Granulocytes: 0 %
Lymphocytes Relative: 23 %
Lymphs Abs: 1.7 10*3/uL (ref 0.7–4.0)
MCH: 33.5 pg (ref 26.0–34.0)
MCHC: 34.9 g/dL (ref 30.0–36.0)
MCV: 96 fL (ref 80.0–100.0)
Monocytes Absolute: 0.7 10*3/uL (ref 0.1–1.0)
Monocytes Relative: 10 %
Neutro Abs: 4.7 10*3/uL (ref 1.7–7.7)
Neutrophils Relative %: 62 %
Platelets: 144 10*3/uL — ABNORMAL LOW (ref 150–400)
RBC: 3.79 MIL/uL — ABNORMAL LOW (ref 4.22–5.81)
RDW: 12.9 % (ref 11.5–15.5)
WBC: 7.6 10*3/uL (ref 4.0–10.5)
nRBC: 0 % (ref 0.0–0.2)

## 2020-06-23 LAB — COMPREHENSIVE METABOLIC PANEL
ALT: 24 U/L (ref 0–44)
AST: 25 U/L (ref 15–41)
Albumin: 3.8 g/dL (ref 3.5–5.0)
Alkaline Phosphatase: 94 U/L (ref 38–126)
Anion gap: 7 (ref 5–15)
BUN: 32 mg/dL — ABNORMAL HIGH (ref 8–23)
CO2: 23 mmol/L (ref 22–32)
Calcium: 9.1 mg/dL (ref 8.9–10.3)
Chloride: 106 mmol/L (ref 98–111)
Creatinine, Ser: 1.11 mg/dL (ref 0.61–1.24)
GFR, Estimated: >60 mL/min (ref >60)
Glucose, Bld: 109 mg/dL — ABNORMAL HIGH (ref 70–99)
Potassium: 4.4 mmol/L (ref 3.5–5.1)
Sodium: 136 mmol/L (ref 135–145)
Total Bilirubin: 0.8 mg/dL (ref 0.3–1.2)
Total Protein: 6.6 g/dL (ref 6.5–8.1)

## 2020-06-23 MED ORDER — SODIUM CHLORIDE 0.9 % IV SOLN: Freq: Once | INTRAVENOUS | Status: AC

## 2020-06-23 MED ORDER — SODIUM CHLORIDE 0.9 % IV SOLN
Freq: Once | INTRAVENOUS | Status: AC
  Administered 2020-06-23: 8 mg via INTRAVENOUS
  Filled 2020-06-23: qty 4

## 2020-06-23 MED ORDER — SODIUM CHLORIDE 0.9% FLUSH
10.0000 mL | INTRAVENOUS | Status: DC | PRN
  Administered 2020-06-23: 10 mL

## 2020-06-23 MED ORDER — HEPARIN SOD (PORK) LOCK FLUSH 100 UNIT/ML IV SOLN
500.0000 [IU] | Freq: Once | INTRAVENOUS | Status: AC | PRN
  Administered 2020-06-23: 500 [IU]

## 2020-06-23 MED ORDER — SODIUM CHLORIDE 0.9 % IV SOLN
10.0000 mg | Freq: Once | INTRAVENOUS | Status: AC
  Administered 2020-06-23: 10 mg via INTRAVENOUS
  Filled 2020-06-23: qty 10

## 2020-06-23 MED ORDER — ONDANSETRON HCL 4 MG/2ML IJ SOLN: 8.0000 mg | Freq: Once | INTRAMUSCULAR | Status: DC

## 2020-06-23 MED ORDER — SODIUM CHLORIDE 0.9 % IV SOLN
75.0000 mg/m2 | Freq: Once | INTRAVENOUS | Status: AC
  Administered 2020-06-23: 180 mg via INTRAVENOUS
  Filled 2020-06-23: qty 18

## 2020-06-23 NOTE — Patient Instructions (Signed)
[facility] Discharge Instructions for Patients Receiving Chemotherapy  Today you received the following chemotherapy agents   To help prevent nausea and vomiting after your treatment, we encourage you to take your nausea medication   If you develop nausea and vomiting that is not controlled by your nausea medication, call the clinic.   BELOW ARE SYMPTOMS THAT SHOULD BE REPORTED IMMEDIATELY:  *FEVER GREATER THAN 100.5 F  *CHILLS WITH OR WITHOUT FEVER  NAUSEA AND VOMITING THAT IS NOT CONTROLLED WITH YOUR NAUSEA MEDICATION  *UNUSUAL SHORTNESS OF BREATH  *UNUSUAL BRUISING OR BLEEDING  TENDERNESS IN MOUTH AND THROAT WITH OR WITHOUT PRESENCE OF ULCERS  *URINARY PROBLEMS  *BOWEL PROBLEMS  UNUSUAL RASH Items with * indicate a potential emergency and should be followed up as soon as possible.  Feel free to call the clinic should you have any questions or concerns. The clinic phone number is [phone].  Please show the CHEMO ALERT CARD at check-in to the Emergency Department and triage nurse.   

## 2020-06-23 NOTE — Progress Notes (Signed)
Pt here for D1C1 of taxotere.  Labs WNL for treatment.  Okay for treatment.   tolerated treatment well today without incidence.  Vital signs stable prior to discharge.  Discharged in stable condition ambulatory.

## 2020-06-24 NOTE — Progress Notes (Signed)
24 hrs call back: left message for patient to call if he had any questions or concerns after his first treatment.

## 2020-06-25 ENCOUNTER — Other Ambulatory Visit: Payer: Self-pay

## 2020-06-25 ENCOUNTER — Inpatient Hospital Stay (HOSPITAL_COMMUNITY): Payer: Medicare Other

## 2020-06-25 VITALS — BP 134/71 | HR 109 | Temp 97.0°F | Resp 20

## 2020-06-25 DIAGNOSIS — Z5111 Encounter for antineoplastic chemotherapy: Secondary | ICD-10-CM | POA: Diagnosis not present

## 2020-06-25 DIAGNOSIS — Z95828 Presence of other vascular implants and grafts: Secondary | ICD-10-CM

## 2020-06-25 DIAGNOSIS — C61 Malignant neoplasm of prostate: Secondary | ICD-10-CM

## 2020-06-25 MED ORDER — PEGFILGRASTIM-JMDB 6 MG/0.6ML ~~LOC~~ SOSY
6.0000 mg | PREFILLED_SYRINGE | Freq: Once | SUBCUTANEOUS | Status: AC
  Administered 2020-06-25: 6 mg via SUBCUTANEOUS

## 2020-06-25 MED ORDER — PEGFILGRASTIM-JMDB 6 MG/0.6ML ~~LOC~~ SOSY
PREFILLED_SYRINGE | SUBCUTANEOUS | Status: AC
  Filled 2020-06-25: qty 0.6

## 2020-06-25 NOTE — Progress Notes (Signed)
Patient tolerated Fulphila injection with no complaints voiced.  Site clean and dry with no bruising or swelling noted.  No complaints of pain.  Discharged with vital signs stable and no signs or symptoms of distress noted.  

## 2020-07-01 ENCOUNTER — Telehealth: Payer: Self-pay | Admitting: Cardiology

## 2020-07-01 NOTE — Telephone Encounter (Signed)
Pt's wife called stating he's unable to afford the ticagrelor (BRILINTA) 90 MG TABS tablet [[id number]]   Has to meet a $6,000 deductible before ins will pay.   Please give pt a call @ [phone]

## 2020-07-01 NOTE — Telephone Encounter (Signed)
Yes, we can switch from Brilinta to Plavix.  Please check with Pharm.D. regarding necessary load of Plavix.

## 2020-07-02 ENCOUNTER — Inpatient Hospital Stay (HOSPITAL_COMMUNITY): Payer: Medicare Other

## 2020-07-02 MED ORDER — CLOPIDOGREL BISULFATE 75 MG PO TABS: 600.0000 mg | ORAL_TABLET | Freq: Once | ORAL | 3 refills | Status: AC

## 2020-07-02 NOTE — Telephone Encounter (Signed)
He should have a 600mg  plavix load 24 hours after his last Brilinta dose.

## 2020-07-02 NOTE — Telephone Encounter (Signed)
Patient's wife informed and verbalized understanding of plan. Wife reports that patient currently has 2 months of brilinta leftover and will finish that first. Will call office when ready for plavix rx to be sent to pharmacy.   Wife also says patient has been having some lightheadedness and dizziness but is currently working as he drives a big truck. First available appointment given 07/22/20 @10 :30 am with Katina Dung NP.

## 2020-07-03 ENCOUNTER — Other Ambulatory Visit (HOSPITAL_COMMUNITY): Payer: Medicare Other

## 2020-07-03 ENCOUNTER — Ambulatory Visit (HOSPITAL_COMMUNITY): Payer: Medicare Other | Admitting: Hematology

## 2020-07-07 ENCOUNTER — Inpatient Hospital Stay (HOSPITAL_COMMUNITY): Payer: Medicare Other

## 2020-07-07 ENCOUNTER — Other Ambulatory Visit: Payer: Self-pay

## 2020-07-07 ENCOUNTER — Encounter (HOSPITAL_COMMUNITY): Payer: Self-pay

## 2020-07-07 VITALS — BP 105/65 | HR 95 | Temp 97.5°F | Resp 20 | Wt 254.1 lb

## 2020-07-07 DIAGNOSIS — C61 Malignant neoplasm of prostate: Secondary | ICD-10-CM

## 2020-07-07 DIAGNOSIS — Z5111 Encounter for antineoplastic chemotherapy: Secondary | ICD-10-CM | POA: Diagnosis not present

## 2020-07-07 LAB — COMPREHENSIVE METABOLIC PANEL
ALT: 32 U/L (ref 0–44)
AST: 27 U/L (ref 15–41)
Albumin: 3.4 g/dL — ABNORMAL LOW (ref 3.5–5.0)
Alkaline Phosphatase: 100 U/L (ref 38–126)
Anion gap: 6 (ref 5–15)
BUN: 32 mg/dL — ABNORMAL HIGH (ref 8–23)
CO2: 23 mmol/L (ref 22–32)
Calcium: 9 mg/dL (ref 8.9–10.3)
Chloride: 109 mmol/L (ref 98–111)
Creatinine, Ser: 1.33 mg/dL — ABNORMAL HIGH (ref 0.61–1.24)
GFR, Estimated: 59 mL/min — ABNORMAL LOW (ref >60)
Glucose, Bld: 123 mg/dL — ABNORMAL HIGH (ref 70–99)
Potassium: 4.6 mmol/L (ref 3.5–5.1)
Sodium: 138 mmol/L (ref 135–145)
Total Bilirubin: 0.7 mg/dL (ref 0.3–1.2)
Total Protein: 6.3 g/dL — ABNORMAL LOW (ref 6.5–8.1)

## 2020-07-07 LAB — CBC WITH DIFFERENTIAL/PLATELET
Abs Immature Granulocytes: 0.15 10*3/uL — ABNORMAL HIGH (ref 0.00–0.07)
Basophils Absolute: 0.1 10*3/uL (ref 0.0–0.1)
Basophils Relative: 1 %
Eosinophils Absolute: 0 10*3/uL (ref 0.0–0.5)
Eosinophils Relative: 0 %
HCT: 34.2 % — ABNORMAL LOW (ref 39.0–52.0)
Hemoglobin: 11.4 g/dL — ABNORMAL LOW (ref 13.0–17.0)
Immature Granulocytes: 1 %
Lymphocytes Relative: 10 %
Lymphs Abs: 1.1 10*3/uL (ref 0.7–4.0)
MCH: 33.4 pg (ref 26.0–34.0)
MCHC: 33.3 g/dL (ref 30.0–36.0)
MCV: 100.3 fL — ABNORMAL HIGH (ref 80.0–100.0)
Monocytes Absolute: 0.4 10*3/uL (ref 0.1–1.0)
Monocytes Relative: 3 %
Neutro Abs: 9.5 10*3/uL — ABNORMAL HIGH (ref 1.7–7.7)
Neutrophils Relative %: 85 %
Platelets: 155 10*3/uL (ref 150–400)
RBC: 3.41 MIL/uL — ABNORMAL LOW (ref 4.22–5.81)
RDW: 14.2 % (ref 11.5–15.5)
WBC: 11.1 10*3/uL — ABNORMAL HIGH (ref 4.0–10.5)
nRBC: 0 % (ref 0.0–0.2)

## 2020-07-07 MED ORDER — DENOSUMAB 120 MG/1.7ML ~~LOC~~ SOLN
120.0000 mg | Freq: Once | SUBCUTANEOUS | Status: AC
  Administered 2020-07-07: 120 mg via SUBCUTANEOUS
  Filled 2020-07-07: qty 1.7

## 2020-07-07 NOTE — Progress Notes (Signed)
Phillips Climes tolerated Xgeva injection well without complaints or incident. Calcium 9 today and pt denied any tooth or jaw pain and no recent or future dental visits prior to administering this medication.Pt continues to take his Calcium PO as prescribed without issues. VSS Pt discharged self ambulatory in satisfactory condition

## 2020-07-07 NOTE — Patient Instructions (Signed)
[facility] Discharge Instructions  Received Xgeva injection today. Follow-up as scheduled   Thank you for choosing [facility] at [facility] to provide your oncology and hematology care.  To afford each patient quality time with our provider, please arrive at least 15 minutes before your scheduled appointment time.   If you have a lab appointment with the Cancer Center please come in thru the Main Entrance and check in at the main information desk.  You need to re-schedule your appointment should you arrive 10 or more minutes late.  We strive to give you quality time with our providers, and arriving late affects you and other patients whose appointments are after yours.  Also, if you no show three or more times for appointments you may be dismissed from the clinic at the providers discretion.     Again, thank you for choosing [facility].  Our hope is that these requests will decrease the amount of time that you wait before being seen by our physicians.       _____________________________________________________________  Should you have questions after your visit to [facility], please contact our office at [phone] and follow the prompts.  Our office hours are 8:00 a.m. and 4:30 p.m. Monday - Friday.  Please note that voicemails left after 4:00 p.m. may not be returned until the following business day.  We are closed weekends and major holidays.  You do have access to a nurse 24-7, just call the main number to the clinic [phone] and do not press any options, hold on the line and a nurse will answer the phone.    For prescription refill requests, have your pharmacy contact our office and allow 72 hours.    Due to Covid, you will need to wear a mask upon entering the hospital. If you do not have a mask, a mask will be given to you at the Main Entrance upon arrival. For doctor visits, patients may have 1  support person age 18 or older with them. For treatment visits, patients can not have anyone with them due to social distancing guidelines and our immunocompromised population.     

## 2020-07-08 ENCOUNTER — Inpatient Hospital Stay (HOSPITAL_BASED_OUTPATIENT_CLINIC_OR_DEPARTMENT_OTHER): Payer: Medicare Other | Admitting: Hematology

## 2020-07-08 VITALS — BP 110/46 | HR 81 | Temp 97.1°F | Resp 18 | Wt 254.0 lb

## 2020-07-08 DIAGNOSIS — C61 Malignant neoplasm of prostate: Secondary | ICD-10-CM

## 2020-07-08 DIAGNOSIS — Z5111 Encounter for antineoplastic chemotherapy: Secondary | ICD-10-CM | POA: Diagnosis not present

## 2020-07-08 NOTE — Progress Notes (Signed)
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 Radical resection of prostate on 06/27/2014. 2. Zytiga from 05/19/2018 to 11/09/2019. 3. Xtandi from 11/21/2019 to 12/05/2019, stopped due to mood changes.  NGS Results: Guardant AR amplification, MSI normal, TMB 16 Muts/Mb  CURRENT THERAPY: Docetaxel every 3 weeks; Xgeva monthly; Lupron every 3 months  BRIEF ONCOLOGIC HISTORY:  Oncology History  Prostate cancer ([place])  02/06/2014 Procedure   Prostate biopsy by Dr. Clyde Lundborg   02/11/2014 Pathology Results   Prostatic adenocarcinoma identified in 5 of 6 prostate specimens with Gleason pattern showing primary pattern-grade 4, secondary pattern grade 3.  Total Gleason score equals 7.  Proportion of prostatic tissue involved by tumor: 70%.   05/03/2014 Imaging   Bone scan- Negative    06/27/2014 Procedure   Radical resection of prostate by Dr. Raynelle Bring   07/29/2014 Pathology Results   1. Prostate, radical resection - PROSTATIC ADENOCARCINOMA, GLEASON SCORE 4 + 3 = 7. - RIGHT AND LEFT PROSTATE INVOLVED. - RIGHT AND LEFT SEMINAL VESICLES INVOLVED BY TUMOR. - EXTRACAPSULAR EXTENSION BY TUMOR. - TUMOR EXTENDS INTO BLADDER NECK TISSUE. - MARGINS NOT INVOLVED. 2. Lymph nodes, regional resection, left pelvic - THREE BENIGN LYMPH NODES (0/3). 3. Lymph nodes, regional resection, right pelvic - FOUR BENIGN LYMPH NODES (0/4).   11/16/2016 Imaging   Bone scan- New areas of increased uptake superolateral to the left orbit (faint), at S1, and in the posterolateral aspect of the left sixth rib.    11/25/2016 Imaging   CT CAP- 1. Status post radical prostatectomy. No findings to suggest local recurrence of disease or definite extraskeletal metastatic disease in the  chest, abdomen or pelvis. However, there are several osseous lesions, as above, concerning for metastatic disease to the bones. 2. Hepatic steatosis. 3. Aortic atherosclerosis, in addition to 2 vessel coronary artery disease. Please note that although the presence of coronary artery calcium documents the presence of coronary artery disease, the severity of this disease and any potential stenosis cannot be assessed on this non-gated CT examination. Assessment for potential risk factor modification, dietary therapy or pharmacologic therapy may be warranted, if clinically indicated. 4. Additional incidental findings, as above.    Genetic Testing   Guardant 360 Results:         06/23/2020 -  Chemotherapy    Patient is on Treatment Plan: PROSTATE DOCETAXEL + PREDNISONE Q21D        CANCER STAGING: Cancer Staging Prostate cancer [facility]) Staging form: Prostate, AJCC 7th Edition - Pathologic stage from 07/29/2014: Stage III (T3b, N0, cM0, Gleason 7) - Signed by Baird Cancer, PA-C on 11/09/2016 - Clinical: Stage IV (T3, N0, M1, PSA: Less than 10, Gleason 7) - Signed by [name], MD on 12/21/2016 - Pathologic: No stage assigned - Unsigned   INTERVAL HISTORY:  [name], a 65 y.o. 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 Hunter Jones was last seen on 06/11/2020.   Today he reports feeling okay. He reports that on day 3 and 4 after treatment, he felt fatigued and nauseous but did not vomit and did not take anything for the nausea. Also on day 3 he reports having several episodes of loose stool which resolved on it own. He  denies getting bone pains after Fulphila. His appetite is excellent and he denies changes in his taste. His balance has improved since starting antibiotics and has several days left.   REVIEW OF SYSTEMS:  Review of Systems  Constitutional: Negative for appetite change and fatigue.  Respiratory: Positive for shortness of  breath.   Gastrointestinal: Positive for diarrhea and nausea (on day 3 & 4 post-Tx). Negative for vomiting.  Genitourinary: Positive for difficulty urinating (weak stream).   Musculoskeletal: Positive for back pain (4/10 back pain). Negative for arthralgias.  Neurological: Positive for dizziness and numbness (feet).  All other systems reviewed and are negative.   PAST MEDICAL/SURGICAL HISTORY:  Past Medical History:  Diagnosis Date  . Acute ST elevation myocardial infarction (STEMI) of inferior wall (HCC) 11/2019  . Asthma   . COPD (chronic obstructive pulmonary disease) (HCC)   . Coronary artery disease    DES to mid LAD August 2014 ([facility]); overlapping DESx2 prox-mid RCA 11/2019  . Essential hypertension   . Hyperthyroidism   . NSTEMI (non-ST elevated myocardial infarction) (HCC) 2014  . Port-A-Cath in place 06/17/2020  . Prostate cancer (HCC)   . Type 2 diabetes mellitus (HCC)    Past Surgical History:  Procedure Laterality Date  . APPENDECTOMY    . CORONARY/GRAFT ACUTE MI REVASCULARIZATION N/A 12/10/2019   Procedure: Coronary/Graft Acute MI Revascularization;  Surgeon: Yvonne Kendall, MD;  Location: [facility] INVASIVE CV LAB;  Service: Cardiovascular;  Laterality: N/A;  . IR IMAGING GUIDED PORT INSERTION  06/19/2020  . LEFT HEART CATH AND CORONARY ANGIOGRAPHY N/A 12/10/2019   Procedure: LEFT HEART CATH AND CORONARY ANGIOGRAPHY;  Surgeon: Yvonne Kendall, MD;  Location: [facility] INVASIVE CV LAB;  Service: Cardiovascular;  Laterality: N/A;  . LYMPHADENECTOMY Bilateral 06/27/2014   Procedure: LYMPHADENECTOMY;  Surgeon: Heloise Purpura, MD;  Location: [facility] ORS;  Service: Urology;  Laterality: Bilateral;  . ROBOT ASSISTED LAPAROSCOPIC RADICAL PROSTATECTOMY N/A 06/27/2014   Procedure: ROBOTIC ASSISTED LAPAROSCOPIC RADICAL PROSTATECTOMY LEVEL 3;  Surgeon: Heloise Purpura, MD;  Location: [facility] ORS;  Service: Urology;  Laterality: N/A;  . TONSILLECTOMY      SOCIAL HISTORY:  Social History   Socioeconomic  History  . Marital status: Married    Spouse name: Not on file  . Number of children: Not on file  . Years of education: Not on file  . Highest education level: Not on file  Occupational History  . Not on file  Tobacco Use  . Smoking status: Former Smoker    Years: 40.00    Types: Cigarettes    Quit date: 06/21/2012    Years since quitting: 8.0  . Smokeless tobacco: Never Used  Vaping Use  . Vaping Use: Never used  Substance and Sexual Activity  . Alcohol use: Yes    Comment: occasional  . Drug use: No  . Sexual activity: Not on file  Other Topics Concern  . Not on file  Social History Narrative  . Not on file   Social Determinants of Health   Financial Resource Strain: Not on file  Food Insecurity: Not on file  Transportation Needs: No Transportation Needs  . Lack of Transportation (Medical): No  . Lack of Transportation (Non-Medical): No  Physical Activity: Inactive  . Days of Exercise per Week: 0 days  . Minutes of Exercise per Session: 0 min  Stress: Not on file  Social Connections: Not on file  Intimate Partner Violence: Not At Risk  . Fear of Current or Ex-Partner: No  . Emotionally Abused:  No  . Physically Abused: No  . Sexually Abused: No    FAMILY HISTORY:  Family History  Problem Relation Age of Onset  . Dementia Mother   . Thyroid disease Mother   . Clotting disorder Mother   . Kidney Stones Father   . Stroke Brother   . Alzheimer's disease Maternal Uncle   . Tuberculosis Paternal Grandmother   . Heart attack Maternal Uncle        x4  . Heart attack Cousin        in his 40s    CURRENT MEDICATIONS:  Current Outpatient Medications  Medication Sig Dispense Refill  . albuterol (PROVENTIL HFA;VENTOLIN HFA) 108 (90 BASE) MCG/ACT inhaler Inhale 1-2 puffs into the lungs every 6 (six) hours as needed for wheezing or shortness of breath.    [name] Kitchen apalutamide (ERLEADA) 60 MG tablet Take 2 tablets (120 mg total) by mouth daily. May be taken with or without  food. Swallow tablets whole. (Patient not taking: No sig reported) 60 tablet 1  . Ascorbic Acid (VITAMIN C PO) Take 1 tablet by mouth daily.    [name] Kitchen aspirin EC 81 MG tablet Take 81 mg by mouth 3 (three) times a week.    [name] Kitchen atorvastatin (LIPITOR) 40 MG tablet TAKE 1 TABLET BY MOUTH AT BEDTIME. (Patient taking differently: Take 40 mg by mouth at bedtime.) 30 tablet 6  . Calcium Carb-Cholecalciferol (HM CALCIUM-VITAMIN D) 600-800 MG-UNIT TABS Take 1 tablet by mouth 2 (two) times daily.    . Coenzyme Q10 (COQ10) 200 MG CAPS Take 200 mg by mouth daily.    [name] Kitchen denosumab (XGEVA) 120 MG/1.7ML SOLN injection Inject 120 mg into the skin every 30 (thirty) days.    . DOCETAXEL IV Inject into the vein every 21 ( twenty-one) days.    . fluticasone-salmeterol (ADVAIR HFA) 115-21 MCG/ACT inhaler Inhale 2 puffs into the lungs 2 (two) times daily.     [name] Kitchen ibuprofen (ADVIL) 200 MG tablet Take 200 mg by mouth every 6 (six) hours as needed for headache or mild pain.    . Ibuprofen-diphenhydrAMINE HCl (IBUPROFEN PM) 200-25 MG CAPS Take 1 tablet by mouth at bedtime as needed (sleep).    [name] Kitchen KRILL OIL PO Take 1 capsule by mouth daily.    [name] Kitchen Leuprolide Acetate, 3 Month, (LUPRON DEPOT, 53-MONTH, IM) Inject 1 Dose into the muscle every 3 (three) months.    . lidocaine-prilocaine (EMLA) cream Apply a small amount to port a cath site and cover with plastic wrap 1 hour prior to chemotherapy appointments 30 g 3  . Lutein 20 MG CAPS Take 20 mg by mouth daily.    . methimazole (TAPAZOLE) 10 MG tablet Take 10 mg by mouth every other day.     . metoprolol succinate (TOPROL-XL) 25 MG 24 hr tablet TAKE 1 TABLET BY MOUTH EVERY DAY (Patient taking differently: Take 25 mg by mouth daily.) 90 tablet 1  . Multiple Vitamin (MULTIVITAMIN) tablet Take 1 tablet by mouth daily.    . nitroGLYCERIN (NITROSTAT) 0.4 MG SL tablet Place 1 tablet (0.4 mg total) under the tongue every 5 (five) minutes as needed. (Patient taking differently: Place 0.4 mg under the  tongue every 5 (five) minutes as needed for chest pain.) 25 tablet 12  . Potassium 99 MG TABS Take 99 mg by mouth daily.    . predniSONE (DELTASONE) 5 MG tablet Take 1 tablet (5 mg total) by mouth daily with breakfast. 60 tablet 11  . prochlorperazine (COMPAZINE) 10 MG  tablet Take 1 tablet (10 mg total) by mouth every 6 (six) hours as needed (Nausea or vomiting). 30 tablet 1  . ticagrelor (BRILINTA) 90 MG TABS tablet Take 1 tablet (90 mg total) by mouth 2 (two) times daily. 60 tablet 11  . Trolamine Salicylate (ASPERCREME EX) Apply 1 application topically daily as needed (pain).     No current facility-administered medications for this visit.    ALLERGIES:  Allergies  Allergen Reactions  . Xtandi [Enzalutamide] Other (See Comments)    "Severe personality change" and blood clots  . Penicillins Hives    PHYSICAL EXAM:  Performance status (ECOG): 1 - Symptomatic but completely ambulatory  Vitals:   07/08/20 0751  BP: (!) 110/46  Pulse: 81  Resp: 18  Temp: (!) 97.1 F (36.2 C)  SpO2: 100%   Wt Readings from Last 3 Encounters:  07/08/20 254 lb (115.2 kg)  07/07/20 254 lb 1.6 oz (115.3 kg)  06/23/20 255 lb 1.6 oz (115.7 kg)   Physical Exam Vitals reviewed.  Constitutional:      Appearance: Normal appearance. He is obese.  Cardiovascular:     Rate and Rhythm: Normal rate and regular rhythm.     Pulses: Normal pulses.     Heart sounds: Normal heart sounds.  Pulmonary:     Effort: Pulmonary effort is normal.     Breath sounds: Normal breath sounds.  Chest:     Comments: Port-a-Cath in R chest Neurological:     General: No focal deficit present.     Mental Status: He is alert and oriented to person, place, and time.  Psychiatric:        Mood and Affect: Mood normal.        Behavior: Behavior normal.      LABORATORY DATA:  I have reviewed the labs as listed.  CBC Latest Ref Rng & Units 07/07/2020 06/23/2020 06/05/2020  WBC 4.0 - 10.5 K/uL 11.1(H) 7.6 6.9  Hemoglobin  13.0 - 17.0 g/dL 11.4(L) 12.7(L) 13.1  Hematocrit 39.0 - 52.0 % 34.2(L) 36.4(L) 37.5(L)  Platelets 150 - 400 K/uL 155 144(L) 156   CMP Latest Ref Rng & Units 07/07/2020 06/23/2020 06/05/2020  Glucose 70 - 99 mg/dL 123(H) 109(H) 135(H)  BUN 8 - 23 mg/dL 32(H) 32(H) 29(H)  Creatinine 0.61 - 1.24 mg/dL 1.33(H) 1.11 1.30(H)  Sodium 135 - 145 mmol/L 138 136 140  Potassium 3.5 - 5.1 mmol/L 4.6 4.4 4.4  Chloride 98 - 111 mmol/L 109 106 108  CO2 22 - 32 mmol/L $RemoveB'23 23 25  'CAzekhvF$ Calcium 8.9 - 10.3 mg/dL 9.0 9.1 9.0  Total Protein 6.5 - 8.1 g/dL 6.3(L) 6.6 6.5  Total Bilirubin 0.3 - 1.2 mg/dL 0.7 0.8 0.7  Alkaline Phos 38 - 126 U/L 100 94 87  AST 15 - 41 U/L $Remo'27 25 27  'ZLbks$ ALT 0 - 44 U/L 32 24 25   PSA 12.50 (H) 06/05/2020  PSA 10.31 (H) 05/23/2020  PSA 8.67 (H) 05/07/2020    DIAGNOSTIC IMAGING:  I have independently reviewed the scans and discussed with the patient. IR IMAGING GUIDED PORT INSERTION  Result Date: 06/19/2020 INDICATION: 65 year old with metastatic prostate cancer. EXAM: FLUOROSCOPIC AND ULTRASOUND GUIDED PLACEMENT OF A SUBCUTANEOUS PORT COMPARISON:  None. MEDICATIONS: Vancomycin 1 g; The antibiotic was administered within an appropriate time interval prior to skin puncture. ANESTHESIA/SEDATION: Versed 1.5 mg IV; Fentanyl 50 mcg IV; Moderate Sedation Time:  24 minutes The patient was continuously monitored during the procedure by the interventional radiology nurse under my  direct supervision. FLUOROSCOPY TIME:  12 seconds, 1 mGy COMPLICATIONS: None immediate. PROCEDURE: The procedure, risks, benefits, and alternatives were explained to the patient. Questions regarding the procedure were encouraged and answered. The patient understands and consents to the procedure. Patient was placed supine on the interventional table. Ultrasound confirmed a patent right internal jugular vein. Ultrasound image was saved for documentation. The right chest and neck were cleaned with a skin antiseptic and a sterile  drape was placed. Maximal barrier sterile technique was utilized including caps, mask, sterile gowns, sterile gloves, sterile drape, hand hygiene and skin antiseptic. The right neck was anesthetized with 1% lidocaine. Small incision was made in the right neck with a blade. Micropuncture set was placed in the right internal jugular vein with ultrasound guidance. The micropuncture wire was used for measurement purposes. The right chest was anesthetized with 1% lidocaine with epinephrine. #15 blade was used to make an incision and a subcutaneous port pocket was formed. 8 french Power Port was assembled. Subcutaneous tunnel was formed with a stiff tunneling device. The port catheter was brought through the subcutaneous tunnel. The port was placed in the subcutaneous pocket. The micropuncture set was exchanged for a peel-away sheath. The catheter was placed through the peel-away sheath and the tip was positioned at the superior cavoatrial junction. Catheter placement was confirmed with fluoroscopy. The port was accessed and flushed with heparinized saline. The port pocket was closed using two layers of absorbable sutures and Dermabond. The vein skin site was closed using a single layer of absorbable suture and Dermabond. Sterile dressings were applied. Patient tolerated the procedure well without an immediate complication. Ultrasound and fluoroscopic images were taken and saved for this procedure. IMPRESSION: Placement of a subcutaneous port device. Catheter tip at the superior cavoatrial junction. Electronically Signed   By: Richarda Overlie M.D.   On: 06/19/2020 16:03     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 -Abiraterone and prednisone from 06/16/2018 through 11/06/2019 with progression. -Xtandi started around 11/21/2019, discontinued on 12/05/2019 due to mood changes. -ST elevation MI involving the right coronary artery on 12/10/2019,  status post cardiac catheterization showing thrombotic occlusion of the proximal RCA, successful PCI with stent placement. -[employer] testing was negative for germline mutations. -Guardant 360 showed AR amplification. No other mutations. -Bicalutamide 50 mg daily from 04/02/2020 through 05/12/2020 with progression. -Bone scan on 06/02/2020 with increased activity throughout the sacrum with no other bony abnormalities. -CT CAP from 06/02/2020 with no visceral metastatic disease. -Lavone Neri is not compatible with Brilinta. -Docetaxel started on 06/23/2020.  Initial discussion was to reassess response after 6 cycles of chemotherapy.  If he has excellent response, and if he is not tolerating well, we may observe at that time.   PLAN:  1. 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 -Had diarrhea for 1 day.  Had fatigue and nausea on day 3 and 4.  Did not report any leg pains. -Did not have any side effects from growth factor. -Reviewed labs from 07/07/2020.  White count is 11.1 likely from growth factor support. -He will come back next week for cycle 2.  No dose modifications needed.  I plan to see him back in 3 weeks after cycle 2.  We will check PSA at that time.  2. Bone metastasis: -He will continue monthly denosumab.  3.Elevated blood sugars: -Continue Jardiance.  4. CADand stents: -Continue aspirin and  Brilinta.  He is in the process of changing Brilinta to Plavix due to high co-pay.   Orders placed this encounter:  No orders of the defined types were placed in this encounter.    Derek Jack, MD [place] [phone]   I, Milinda Antis, am acting as a scribe for Dr. Sanda Linger.  I, Derek Jack MD, have reviewed the above documentation for accuracy and completeness, and I agree with the above.

## 2020-07-08 NOTE — Patient Instructions (Signed)
[place] at [facility] Discharge Instructions  You were seen today by Dr. Delton Coombes. He went over your recent results. You may proceed with your treatment next week. Dr. Delton Coombes will see you back in 4 weeks for labs and follow up.   Thank you for choosing [place] at [facility] to provide your oncology and hematology care.  To afford each patient quality time with our provider, please arrive at least 15 minutes before your scheduled appointment time.   If you have a lab appointment with the [facility] please come in thru the Main Entrance and check in at the main information desk  You need to re-schedule your appointment should you arrive 10 or more minutes late.  We strive to give you quality time with our providers, and arriving late affects you and other patients whose appointments are after yours.  Also, if you no show three or more times for appointments you may be dismissed from the clinic at the providers discretion.     Again, thank you for choosing [facility].  Our hope is that these requests will decrease the amount of time that you wait before being seen by our physicians.       _____________________________________________________________  Should you have questions after your visit to [facility], please contact our office at [phone] between the hours of 8:00 a.m. and 4:30 p.m.  Voicemails left after 4:00 p.m. will not be returned until the following business day.  For prescription refill requests, have your pharmacy contact our office and allow 72 hours.    Cancer Center Support Programs:   > Cancer Support Group  2nd Tuesday of the month 1pm-2pm, Journey Room

## 2020-07-14 ENCOUNTER — Inpatient Hospital Stay (HOSPITAL_COMMUNITY): Payer: Medicare Other

## 2020-07-14 ENCOUNTER — Encounter (HOSPITAL_COMMUNITY): Payer: Self-pay

## 2020-07-14 ENCOUNTER — Other Ambulatory Visit: Payer: Self-pay

## 2020-07-14 ENCOUNTER — Other Ambulatory Visit (HOSPITAL_COMMUNITY): Payer: Self-pay

## 2020-07-14 ENCOUNTER — Other Ambulatory Visit (HOSPITAL_COMMUNITY): Payer: Medicare Other

## 2020-07-14 VITALS — BP 115/72 | HR 79 | Temp 96.9°F | Resp 18

## 2020-07-14 DIAGNOSIS — C61 Malignant neoplasm of prostate: Secondary | ICD-10-CM

## 2020-07-14 DIAGNOSIS — Z5111 Encounter for antineoplastic chemotherapy: Secondary | ICD-10-CM | POA: Diagnosis not present

## 2020-07-14 DIAGNOSIS — C7951 Secondary malignant neoplasm of bone: Secondary | ICD-10-CM

## 2020-07-14 DIAGNOSIS — Z95828 Presence of other vascular implants and grafts: Secondary | ICD-10-CM

## 2020-07-14 LAB — CBC WITH DIFFERENTIAL/PLATELET
Abs Immature Granulocytes: 0.03 10*3/uL (ref 0.00–0.07)
Basophils Absolute: 0 10*3/uL (ref 0.0–0.1)
Basophils Relative: 1 %
Eosinophils Absolute: 0.2 10*3/uL (ref 0.0–0.5)
Eosinophils Relative: 2 %
HCT: 34 % — ABNORMAL LOW (ref 39.0–52.0)
Hemoglobin: 11.6 g/dL — ABNORMAL LOW (ref 13.0–17.0)
Immature Granulocytes: 0 %
Lymphocytes Relative: 15 %
Lymphs Abs: 1.1 10*3/uL (ref 0.7–4.0)
MCH: 34.4 pg — ABNORMAL HIGH (ref 26.0–34.0)
MCHC: 34.1 g/dL (ref 30.0–36.0)
MCV: 100.9 fL — ABNORMAL HIGH (ref 80.0–100.0)
Monocytes Absolute: 0.4 10*3/uL (ref 0.1–1.0)
Monocytes Relative: 6 %
Neutro Abs: 5.4 10*3/uL (ref 1.7–7.7)
Neutrophils Relative %: 76 %
Platelets: 194 10*3/uL (ref 150–400)
RBC: 3.37 MIL/uL — ABNORMAL LOW (ref 4.22–5.81)
RDW: 14.6 % (ref 11.5–15.5)
WBC: 7.1 10*3/uL (ref 4.0–10.5)
nRBC: 0 % (ref 0.0–0.2)

## 2020-07-14 LAB — COMPREHENSIVE METABOLIC PANEL
ALT: 28 U/L (ref 0–44)
AST: 25 U/L (ref 15–41)
Albumin: 3.6 g/dL (ref 3.5–5.0)
Alkaline Phosphatase: 84 U/L (ref 38–126)
Anion gap: 5 (ref 5–15)
BUN: 25 mg/dL — ABNORMAL HIGH (ref 8–23)
CO2: 23 mmol/L (ref 22–32)
Calcium: 8.8 mg/dL — ABNORMAL LOW (ref 8.9–10.3)
Chloride: 108 mmol/L (ref 98–111)
Creatinine, Ser: 1.14 mg/dL (ref 0.61–1.24)
GFR, Estimated: >60 mL/min (ref >60)
Glucose, Bld: 104 mg/dL — ABNORMAL HIGH (ref 70–99)
Potassium: 4.3 mmol/L (ref 3.5–5.1)
Sodium: 136 mmol/L (ref 135–145)
Total Bilirubin: 0.7 mg/dL (ref 0.3–1.2)
Total Protein: 6.1 g/dL — ABNORMAL LOW (ref 6.5–8.1)

## 2020-07-14 LAB — MAGNESIUM: Magnesium: 1.9 mg/dL (ref 1.7–2.4)

## 2020-07-14 MED ORDER — ONDANSETRON HCL 4 MG/2ML IJ SOLN: 8.0000 mg | Freq: Once | INTRAMUSCULAR | Status: DC

## 2020-07-14 MED ORDER — SODIUM CHLORIDE 0.9% FLUSH
10.0000 mL | INTRAVENOUS | Status: DC | PRN
  Administered 2020-07-14: 10 mL

## 2020-07-14 MED ORDER — SODIUM CHLORIDE 0.9 % IV SOLN: Freq: Once | INTRAVENOUS | Status: AC

## 2020-07-14 MED ORDER — SODIUM CHLORIDE 0.9 % IV SOLN
10.0000 mg | Freq: Once | INTRAVENOUS | Status: AC
  Administered 2020-07-14: 10 mg via INTRAVENOUS
  Filled 2020-07-14: qty 10

## 2020-07-14 MED ORDER — SODIUM CHLORIDE 0.9 % IV SOLN
Freq: Once | INTRAVENOUS | Status: AC
  Administered 2020-07-14: 8 mg via INTRAVENOUS
  Filled 2020-07-14: qty 4

## 2020-07-14 MED ORDER — SODIUM CHLORIDE 0.9 % IV SOLN
75.0000 mg/m2 | Freq: Once | INTRAVENOUS | Status: AC
  Administered 2020-07-14: 180 mg via INTRAVENOUS
  Filled 2020-07-14: qty 18

## 2020-07-14 MED ORDER — HEPARIN SOD (PORK) LOCK FLUSH 100 UNIT/ML IV SOLN
500.0000 [IU] | Freq: Once | INTRAVENOUS | Status: AC | PRN
  Administered 2020-07-14: 500 [IU]

## 2020-07-14 NOTE — Progress Notes (Signed)
Patient tolerated chemotherapy with no complaints voiced.  Side effects with management reviewed with understanding verbalized.  Port site clean and dry with no bruising or swelling noted at site.  Good blood return noted before and after administration of chemotherapy.  Band aid applied.  Patient left in satisfactory condition with VSS and no s/s of distress noted.   

## 2020-07-16 ENCOUNTER — Encounter (HOSPITAL_COMMUNITY): Payer: Self-pay

## 2020-07-16 ENCOUNTER — Inpatient Hospital Stay (HOSPITAL_COMMUNITY): Payer: Medicare Other | Attending: Hematology

## 2020-07-16 ENCOUNTER — Other Ambulatory Visit: Payer: Self-pay

## 2020-07-16 VITALS — BP 111/65 | HR 84 | Temp 98.1°F | Resp 18

## 2020-07-16 DIAGNOSIS — C61 Malignant neoplasm of prostate: Secondary | ICD-10-CM | POA: Diagnosis present

## 2020-07-16 DIAGNOSIS — C7951 Secondary malignant neoplasm of bone: Secondary | ICD-10-CM | POA: Insufficient documentation

## 2020-07-16 DIAGNOSIS — Z5111 Encounter for antineoplastic chemotherapy: Secondary | ICD-10-CM | POA: Insufficient documentation

## 2020-07-16 DIAGNOSIS — Z5189 Encounter for other specified aftercare: Secondary | ICD-10-CM | POA: Insufficient documentation

## 2020-07-16 DIAGNOSIS — Z95828 Presence of other vascular implants and grafts: Secondary | ICD-10-CM

## 2020-07-16 MED ORDER — PEGFILGRASTIM-JMDB 6 MG/0.6ML ~~LOC~~ SOSY
6.0000 mg | PREFILLED_SYRINGE | Freq: Once | SUBCUTANEOUS | Status: AC
  Administered 2020-07-16: 6 mg via SUBCUTANEOUS

## 2020-07-16 NOTE — Patient Instructions (Signed)
[facility] at [facility] Discharge Instructions  Received Fulphila injection today. Follow-up as scheduled   Thank you for choosing [facility] at [facility] to provide your oncology and hematology care.  To afford each patient quality time with our provider, please arrive at least 15 minutes before your scheduled appointment time.   If you have a lab appointment with the Cancer Center please come in thru the Main Entrance and check in at the main information desk.  You need to re-schedule your appointment should you arrive 10 or more minutes late.  We strive to give you quality time with our providers, and arriving late affects you and other patients whose appointments are after yours.  Also, if you no show three or more times for appointments you may be dismissed from the clinic at the providers discretion.     Again, thank you for choosing [facility].  Our hope is that these requests will decrease the amount of time that you wait before being seen by our physicians.       _____________________________________________________________  Should you have questions after your visit to [facility], please contact our office at [phone] and follow the prompts.  Our office hours are 8:00 a.m. and 4:30 p.m. Monday - Friday.  Please note that voicemails left after 4:00 p.m. may not be returned until the following business day.  We are closed weekends and major holidays.  You do have access to a nurse 24-7, just call the main number to the clinic [phone] and do not press any options, hold on the line and a nurse will answer the phone.    For prescription refill requests, have your pharmacy contact our office and allow 72 hours.    Due to Covid, you will need to wear a mask upon entering the hospital. If you do not have a mask, a mask will be given to you at the Main Entrance upon arrival. For doctor visits, patients may  have 1 support person age 18 or older with them. For treatment visits, patients can not have anyone with them due to social distancing guidelines and our immunocompromised population.     

## 2020-07-16 NOTE — Progress Notes (Signed)
Hunter Jones tolerated Fulphila injection well without complaints or incident. VSS Pt discharged self ambulatory in satisfactory condition

## 2020-07-21 NOTE — Progress Notes (Unsigned)
Cardiology Office Note  Date: 07/22/2020   ID: Hunter Jones, DOB 04-06-1956, MRN [id number]  PCP:  Monico Blitz, MD  Cardiologist:  Rozann Lesches, MD Electrophysiologist:  None   Chief Complaint: Cardiac follow-up  History of Present Illness: Hunter Jones is a 65 y.o. male with a history of CAD/STEMI/NSTEMI, COPD, HTN, DM 2, hypothyroidism.  History of previous DES to mid LAD 18 2014.  More recently overlapping DES to proximal mid RCA June 2020 in setting of inferior STEMI  Last encounter with Dr. Domenic Polite 03/24/2020 for routine follow-up.  He was continuing cardiac rehab he was doing well without active angina.  He did not report any bleeding problems on dual antiplatelet therapy.  Previous LDL 22 on Lipitor.  Currently on aspirin, Brilinta, Lipitor, Jardiance, losartan, Toprol-XL and as needed nitroglycerin sublingual.  He is here for follow-up today he is complaining of some recent dizziness and lightheadedness.  Denies any near syncopal or syncopal episodes.  States it seems to occur when bending over and standing erect.  Also complaining of some nose bleeding when he takes the evening dose of Brilinta.  States he just received a new prescription for Brilinta.  States he did not take the evening dose of Brilinta a couple days and the bleeding had stopped.  He wants to finish the Brilinta out which is approximately 2 months worth and would like to switch to Plavix in the following months.  He will be eligible to stop dual antiplatelet therapy on December 10, 2018.  Otherwise he denies any other issues today.  Past Medical History:  Diagnosis Date  . Acute ST elevation myocardial infarction (STEMI) of inferior wall ([place]) 11/2019  . Asthma   . COPD (chronic obstructive pulmonary disease) ([place])   . Coronary artery disease    DES to mid LAD August 2014 ([facility]); overlapping DESx2 prox-mid RCA 11/2019  . Essential hypertension   . Hyperthyroidism   . NSTEMI (non-ST elevated myocardial  infarction) ([place]) 2014  . Port-A-Cath in place 06/17/2020  . Prostate cancer ([place])   . Type 2 diabetes mellitus ([place])     Past Surgical History:  Procedure Laterality Date  . APPENDECTOMY    . CORONARY/GRAFT ACUTE MI REVASCULARIZATION N/A 12/10/2019   Procedure: Coronary/Graft Acute MI Revascularization;  Surgeon: Nelva Bush, MD;  Location: [facility];  Service: Cardiovascular;  Laterality: N/A;  . IR IMAGING GUIDED PORT INSERTION  06/19/2020  . LEFT HEART CATH AND CORONARY ANGIOGRAPHY N/A 12/10/2019   Procedure: LEFT HEART CATH AND CORONARY ANGIOGRAPHY;  Surgeon: Nelva Bush, MD;  Location: [facility];  Service: Cardiovascular;  Laterality: N/A;  . LYMPHADENECTOMY Bilateral 06/27/2014   Procedure: LYMPHADENECTOMY;  Surgeon: Raynelle Bring, MD;  Location: [facility];  Service: Urology;  Laterality: Bilateral;  . ROBOT ASSISTED LAPAROSCOPIC RADICAL PROSTATECTOMY N/A 06/27/2014   Procedure: ROBOTIC ASSISTED LAPAROSCOPIC RADICAL PROSTATECTOMY LEVEL 3;  Surgeon: Raynelle Bring, MD;  Location: [facility];  Service: Urology;  Laterality: N/A;  . TONSILLECTOMY      Current Outpatient Medications  Medication Sig Dispense Refill  . albuterol (PROVENTIL HFA;VENTOLIN HFA) 108 (90 BASE) MCG/ACT inhaler Inhale 1-2 puffs into the lungs every 6 (six) hours as needed for wheezing or shortness of breath.    . Ascorbic Acid (VITAMIN C PO) Take 1 tablet by mouth daily.    [name] Kitchen aspirin EC 81 MG tablet Take 81 mg by mouth 3 (three) times a week.    [name] Kitchen atorvastatin (LIPITOR) 40 MG tablet  TAKE 1 TABLET BY MOUTH AT BEDTIME. (Patient taking differently: Take 40 mg by mouth at bedtime.) 30 tablet 6  . Calcium Carb-Cholecalciferol (HM CALCIUM-VITAMIN D) 600-800 MG-UNIT TABS Take 1 tablet by mouth 2 (two) times daily.    . Coenzyme Q10 (COQ10) 200 MG CAPS Take 200 mg by mouth daily.    [name] Kitchen denosumab (XGEVA) 120 MG/1.7ML SOLN injection Inject 120 mg into the skin every 30 (thirty) days.    . DOCETAXEL IV Inject  into the vein every 21 ( twenty-one) days.    . fluticasone-salmeterol (ADVAIR HFA) 115-21 MCG/ACT inhaler Inhale 2 puffs into the lungs 2 (two) times daily.     [name] Kitchen ibuprofen (ADVIL) 200 MG tablet Take 200 mg by mouth every 6 (six) hours as needed for headache or mild pain.    . Ibuprofen-diphenhydrAMINE HCl (IBUPROFEN PM) 200-25 MG CAPS Take 1 tablet by mouth at bedtime as needed (sleep).    [name] Kitchen KRILL OIL PO Take 1 capsule by mouth daily.    [name] Kitchen Leuprolide Acetate, 3 Month, (LUPRON DEPOT, 48-MONTH, IM) Inject 1 Dose into the muscle every 3 (three) months.    . lidocaine-prilocaine (EMLA) cream Apply a small amount to port a cath site and cover with plastic wrap 1 hour prior to chemotherapy appointments 30 g 3  . Lutein 20 MG CAPS Take 20 mg by mouth daily.    . methimazole (TAPAZOLE) 10 MG tablet Take 10 mg by mouth every other day.     . metoprolol succinate (TOPROL-XL) 25 MG 24 hr tablet TAKE 1 TABLET BY MOUTH EVERY DAY (Patient taking differently: Take 25 mg by mouth daily.) 90 tablet 1  . Multiple Vitamin (MULTIVITAMIN) tablet Take 1 tablet by mouth daily.    . nitroGLYCERIN (NITROSTAT) 0.4 MG SL tablet Place 1 tablet (0.4 mg total) under the tongue every 5 (five) minutes as needed. 25 tablet 12  . Potassium 99 MG TABS Take 99 mg by mouth daily.    . predniSONE (DELTASONE) 5 MG tablet Take 1 tablet (5 mg total) by mouth daily with breakfast. 60 tablet 11  . prochlorperazine (COMPAZINE) 10 MG tablet Take 1 tablet (10 mg total) by mouth every 6 (six) hours as needed (Nausea or vomiting). 30 tablet 1  . ticagrelor (BRILINTA) 90 MG TABS tablet Take 1 tablet (90 mg total) by mouth 2 (two) times daily. 60 tablet 11  . Trolamine Salicylate (ASPERCREME EX) Apply 1 application topically daily as needed (pain).     No current facility-administered medications for this visit.   Allergies:  Xtandi [enzalutamide] and Penicillins   Social History: The patient  reports that he quit smoking about 8 years ago.  His smoking use included cigarettes. He quit after 40.00 years of use. He has never used smokeless tobacco. He reports current alcohol use. He reports that he does not use drugs.   Family History: The patient's family history includes Alzheimer's disease in his maternal uncle; Clotting disorder in his mother; Dementia in his mother; Heart attack in his cousin and maternal uncle; Kidney Stones in his father; Stroke in his brother; Thyroid disease in his mother; Tuberculosis in his paternal grandmother.   ROS:  Please see the history of present illness. Otherwise, complete review of systems is positive for none.  All other systems are reviewed and negative.   Physical Exam: VS:  BP (!) 104/58   Pulse 72   Ht 5' 10.5" (1.791 m)   Wt 252 lb 12.8 oz (114.7 kg)  SpO2 97%   BMI 35.76 kg/m , BMI Body mass index is 35.76 kg/m.  Wt Readings from Last 3 Encounters:  07/22/20 252 lb 12.8 oz (114.7 kg)  07/14/20 253 lb 14.4 oz (115.2 kg)  07/08/20 254 lb (115.2 kg)    General: Obese patient appears comfortable at rest. Neck: Supple, no elevated JVP or carotid bruits, no thyromegaly. Lungs: Clear to auscultation, nonlabored breathing at rest. Cardiac: Regular rate and rhythm, no S3 or significant systolic murmur, no pericardial rub. Extremities: No pitting edema, distal pulses 2+. Skin: Warm and dry. Musculoskeletal: No kyphosis. Neuropsychiatric: Alert and oriented x3, affect grossly appropriate.  ECG:    Recent Labwork: 07/14/2020: ALT 28; AST 25; BUN 25; Creatinine, Ser 1.14; Hemoglobin 11.6; Magnesium 1.9; Platelets 194; Potassium 4.3; Sodium 136     Component Value Date/Time   CHOL 68 12/10/2019 2351   TRIG 91 12/10/2019 2351   HDL 28 (L) 12/10/2019 2351   CHOLHDL 2.4 12/10/2019 2351   VLDL 18 12/10/2019 2351   LDLCALC 22 12/10/2019 2351    Other Studies Reviewed Today:  Cardiac catheterization 12/10/2019: Conclusions: 1. Severe single-vessel coronary artery disease with  thrombotic occlusion of the proximal RCA. 2. Moderate proximal RCA and mid LCx disease. 3. Widely patent overlapping LAD stents. 4. Upper normal left ventricular filling pressure. 5. Successful PCI to proximal/mid RCA using overlapping Resolute Onyx 3.5 x 18 mm and 3.0 x 34 mm drug-eluting stents with 0% residual stenosis and TIMI-3 flow.  Echocardiogram 12/10/2019: 1. Left ventricular ejection fraction, by estimation, is 55 to 60%. The  left ventricle has normal function. Left ventricular endocardial border  not optimally defined to evaluate regional wall motion. There is mild left  ventricular hypertrophy. Left  ventricular diastolic parameters were normal.  2. Poor acoustic windows limit study, even with Definity RV is not well  seen to accurately assess LVEF . Right ventricular systolic function was  not well visualized. The right ventricular size is normal.  3. The mitral valve is normal in structure. Trivial mitral valve  regurgitation.  4. The aortic valve is normal in structure. Aortic valve regurgitation is  not visualized.  5. The inferior vena cava is dilated in size with <50% respiratory  variability, suggesting right atrial pressure of 15 mmHg.   Assessment and Plan:  1. Dizziness   2. Lightheadedness   3. CAD in native artery   4. Mixed hyperlipidemia    1. Dizziness / 2. Lightheadedness States he has been experiencing some lightheadedness/dizziness recently.  States he notes it mainly when bending over and attempting to stand erect.  Denies any syncopal or near syncopal episodes.  Decrease losartan to 12.5 mg daily.  3. CAD in native artery Denies any anginal or exertional symptoms.  Continue aspirin 81 mg.  Brilinta 90 mg p.o. twice daily for the next 2 months.  Patient wants to switch due to cost of Brilinta and recent nosebleeds.  On April 8 we will switch to Plavix.  Nursing staff is going to call Pharm.D. to determine loading dose of Plavix at that time  when switching.  Patient will remain on Plavix until June 28 when he will be 1 year out from coronary intervention/stent placement at which point he can stop dual antiplatelet therapy and continue aspirin only 81 mg.  Continue Toprol-XL 25 mg daily  4. Mixed hyperlipidemia Continue atorvastatin 40 mg daily.  Medication Adjustments/Labs and Tests Ordered: Current medicines are reviewed at length with the patient today.  Concerns regarding medicines  are outlined above.   Disposition: Follow-up with Dr. Domenic Polite or APP 6 months  Signed, Levell July, NP 07/22/2020 10:49 AM    [place] at [place], [place], [place] [address] Phone: [phone]; Fax: [phone]

## 2020-07-22 ENCOUNTER — Encounter: Payer: Self-pay | Admitting: Family Medicine

## 2020-07-22 ENCOUNTER — Ambulatory Visit (INDEPENDENT_AMBULATORY_CARE_PROVIDER_SITE_OTHER): Payer: Medicare Other | Admitting: Family Medicine

## 2020-07-22 VITALS — BP 104/58 | HR 72 | Ht 70.5 in | Wt 252.8 lb

## 2020-07-22 DIAGNOSIS — R42 Dizziness and giddiness: Secondary | ICD-10-CM | POA: Diagnosis not present

## 2020-07-22 DIAGNOSIS — E782 Mixed hyperlipidemia: Secondary | ICD-10-CM

## 2020-07-22 DIAGNOSIS — I2510 Atherosclerotic heart disease of native coronary artery without angina pectoris: Secondary | ICD-10-CM

## 2020-07-22 MED ORDER — LOSARTAN POTASSIUM 25 MG PO TABS: 12.5000 mg | ORAL_TABLET | Freq: Every day | ORAL | 3 refills | Status: DC

## 2020-07-22 NOTE — Patient Instructions (Addendum)
Medication Instructions:   Decrease Losartan to 12.5mg  daily.   Once finish the Brilinta, will change to Plavix in April.  Notify office a week or so before finishing current supply on the Brilinta.    Continue all other medications.    Labwork: none  Testing/Procedures: none  Follow-Up: 6 months   Any Other Special Instructions Will Be Listed Below (If Applicable).  If you need a refill on your cardiac medications before your next appointment, please call your pharmacy.

## 2020-07-23 ENCOUNTER — Telehealth: Payer: Self-pay | Admitting: *Deleted

## 2020-07-23 ENCOUNTER — Encounter: Payer: Self-pay | Admitting: *Deleted

## 2020-07-23 NOTE — Telephone Encounter (Signed)
-----   Message from Rollen Sox, [facility] sent at 07/22/2020  5:40 PM EST ----- Regarding: RE: plavix loading Recommend patient take Plavix 600mg  twelve hours after last dose of Brilinta and then proceed with Plavix 75 mg once a day  ~Chris ----- Message ----- From: Laurine Blazer, LPN Sent: 09/16/9751   5:35 PM EST To: Verta Ellen., NP, [facility] Subject: plavix loading                                 Patient seen in office today by Katina Dung, NP.  Patient wanting to change to Plavix from Brilinta due to cost.  Has enough to last till April.  Will make the change at that time.   Please advise on loading dose of Plavix for this patient.     Thanks,  Edd Fabian

## 2020-07-23 NOTE — Telephone Encounter (Signed)
Patient notified via mychart

## 2020-07-24 ENCOUNTER — Encounter: Payer: Self-pay | Admitting: *Deleted

## 2020-07-31 NOTE — Progress Notes (Signed)
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 Radical resection of prostate on 06/27/2014. 2. Zytiga from 05/19/2018 to 11/09/2019. 3. Xtandi from 11/21/2019 to 12/05/2019, stopped due to mood changes.  NGS Results: Guardant AR amplification, MSI normal, TMB 16 Muts/Mb  CURRENT THERAPY: Docetaxel every 3 weeks; Xgeva monthly; Lupron every 3 months  BRIEF ONCOLOGIC HISTORY:  Oncology History  Prostate cancer ([place])  02/06/2014 Procedure   Prostate biopsy by Dr. Clyde Lundborg   02/11/2014 Pathology Results   Prostatic adenocarcinoma identified in 5 of 6 prostate specimens with Gleason pattern showing primary pattern-grade 4, secondary pattern grade 3.  Total Gleason score equals 7.  Proportion of prostatic tissue involved by tumor: 70%.   05/03/2014 Imaging   Bone scan- Negative    06/27/2014 Procedure   Radical resection of prostate by Dr. Raynelle Bring   07/29/2014 Pathology Results   1. Prostate, radical resection - PROSTATIC ADENOCARCINOMA, GLEASON SCORE 4 + 3 = 7. - RIGHT AND LEFT PROSTATE INVOLVED. - RIGHT AND LEFT SEMINAL VESICLES INVOLVED BY TUMOR. - EXTRACAPSULAR EXTENSION BY TUMOR. - TUMOR EXTENDS INTO BLADDER NECK TISSUE. - MARGINS NOT INVOLVED. 2. Lymph nodes, regional resection, left pelvic - THREE BENIGN LYMPH NODES (0/3). 3. Lymph nodes, regional resection, right pelvic - FOUR BENIGN LYMPH NODES (0/4).   11/16/2016 Imaging   Bone scan- New areas of increased uptake superolateral to the left orbit (faint), at S1, and in the posterolateral aspect of the left sixth rib.    11/25/2016 Imaging   CT CAP- 1. Status post radical prostatectomy. No findings to suggest local recurrence of disease or definite extraskeletal metastatic disease in the  chest, abdomen or pelvis. However, there are several osseous lesions, as above, concerning for metastatic disease to the bones. 2. Hepatic steatosis. 3. Aortic atherosclerosis, in addition to 2 vessel coronary artery disease. Please note that although the presence of coronary artery calcium documents the presence of coronary artery disease, the severity of this disease and any potential stenosis cannot be assessed on this non-gated CT examination. Assessment for potential risk factor modification, dietary therapy or pharmacologic therapy may be warranted, if clinically indicated. 4. Additional incidental findings, as above.    Genetic Testing   Guardant 360 Results:         06/23/2020 -  Chemotherapy    Patient is on Treatment Plan: PROSTATE DOCETAXEL + PREDNISONE Q21D        CANCER STAGING: Cancer Staging Prostate cancer [facility]) Staging form: Prostate, AJCC 7th Edition - Pathologic stage from 07/29/2014: Stage III (T3b, N0, cM0, Gleason 7) - Signed by Baird Cancer, PA-C on 11/09/2016 - Clinical: Stage IV (T3, N0, M1, PSA: Less than 10, Gleason 7) - Signed by [name], MD on 12/21/2016 - Pathologic: No stage assigned - Unsigned   INTERVAL HISTORY:  Hunter Jones, a 65 y.o. male, returns for routine follow-up and consideration for next cycle of chemotherapy. Hunter Jones was last seen on 07/08/2020.  Due for cycle #3 of docetaxel today.   Overall, he tells me he has been feeling pretty well.  He had diarrhea for couple of days starting day 3.  He took Imodium and it improved.  He also felt tired for a few days which also improved.  Denies  any new onset pains.  Overall, he feels ready for next cycle of chemo today.    REVIEW OF SYSTEMS:  Review of Systems  All other systems reviewed and are negative.   PAST MEDICAL/SURGICAL HISTORY:  Past Medical History:  Diagnosis Date  . Acute ST elevation myocardial infarction (STEMI) of inferior wall ([place]) 11/2019  . Asthma    . COPD (chronic obstructive pulmonary disease) ([place])   . Coronary artery disease    DES to mid LAD August 2014 ([facility]); overlapping DESx2 prox-mid RCA 11/2019  . Essential hypertension   . Hyperthyroidism   . NSTEMI (non-ST elevated myocardial infarction) ([place]) 2014  . Port-A-Cath in place 06/17/2020  . Prostate cancer ([place])   . Type 2 diabetes mellitus ([place])    Past Surgical History:  Procedure Laterality Date  . APPENDECTOMY    . CORONARY/GRAFT ACUTE MI REVASCULARIZATION N/A 12/10/2019   Procedure: Coronary/Graft Acute MI Revascularization;  Surgeon: Nelva Bush, MD;  Location: [place] [facility];  Service: Cardiovascular;  Laterality: N/A;  . IR IMAGING GUIDED PORT INSERTION  06/19/2020  . LEFT HEART CATH AND CORONARY ANGIOGRAPHY N/A 12/10/2019   Procedure: LEFT HEART CATH AND CORONARY ANGIOGRAPHY;  Surgeon: Nelva Bush, MD;  Location: [place] [facility];  Service: Cardiovascular;  Laterality: N/A;  . LYMPHADENECTOMY Bilateral 06/27/2014   Procedure: LYMPHADENECTOMY;  Surgeon: Raynelle Bring, MD;  Location: [facility];  Service: Urology;  Laterality: Bilateral;  . ROBOT ASSISTED LAPAROSCOPIC RADICAL PROSTATECTOMY N/A 06/27/2014   Procedure: ROBOTIC ASSISTED LAPAROSCOPIC RADICAL PROSTATECTOMY LEVEL 3;  Surgeon: Raynelle Bring, MD;  Location: [facility];  Service: Urology;  Laterality: N/A;  . TONSILLECTOMY      SOCIAL HISTORY:  Social History   Socioeconomic History  . Marital status: Married    Spouse name: Not on file  . Number of children: Not on file  . Years of education: Not on file  . Highest education level: Not on file  Occupational History  . Not on file  Tobacco Use  . Smoking status: Former Smoker    Years: 40.00    Types: Cigarettes    Quit date: 06/21/2012    Years since quitting: 8.1  . Smokeless tobacco: Never Used  Vaping Use  . Vaping Use: Never used  Substance and Sexual Activity  . Alcohol use: Yes    Comment: occasional  . Drug use: No  . Sexual  activity: Not on file  Other Topics Concern  . Not on file  Social History Narrative  . Not on file   Social Determinants of Health   Financial Resource Strain: Low Risk   . Difficulty of Paying Living Expenses: Not hard at all  Food Insecurity: No Food Insecurity  . Worried About Charity fundraiser in the Last Year: Never true  . Ran Out of Food in the Last Year: Never true  Transportation Needs: No Transportation Needs  . Lack of Transportation (Medical): No  . Lack of Transportation (Non-Medical): No  Physical Activity: Inactive  . Days of Exercise per Week: 0 days  . Minutes of Exercise per Session: 0 min  Stress: No Stress Concern Present  . Feeling of Stress : Not at all  Social Connections: Moderately Integrated  . Frequency of Communication with Friends and Family: More than three times a week  . Frequency of Social Gatherings with Friends and Family: Never  . Attends Religious Services: Never  . Active Member of Clubs or Organizations: Yes  . Attends Archivist  Meetings: Never  . Marital Status: Married  Human resources officer Violence: Not At Risk  . Fear of Current or Ex-Partner: No  . Emotionally Abused: No  . Physically Abused: No  . Sexually Abused: No    FAMILY HISTORY:  Family History  Problem Relation Age of Onset  . Dementia Mother   . Thyroid disease Mother   . Clotting disorder Mother   . Kidney Stones Father   . Stroke Brother   . Alzheimer's disease Maternal Uncle   . Tuberculosis Paternal Grandmother   . Heart attack Maternal Uncle        x4  . Heart attack Cousin        in his 65s    CURRENT MEDICATIONS:  Current Outpatient Medications  Medication Sig Dispense Refill  . albuterol (PROVENTIL HFA;VENTOLIN HFA) 108 (90 BASE) MCG/ACT inhaler Inhale 1-2 puffs into the lungs every 6 (six) hours as needed for wheezing or shortness of breath.    . Ascorbic Acid (VITAMIN C PO) Take 1 tablet by mouth daily.    [name] Kitchen aspirin EC 81 MG tablet Take  81 mg by mouth 3 (three) times a week.    [name] Kitchen atorvastatin (LIPITOR) 40 MG tablet TAKE 1 TABLET BY MOUTH AT BEDTIME. (Patient taking differently: Take 40 mg by mouth at bedtime.) 30 tablet 6  . Calcium Carb-Cholecalciferol (HM CALCIUM-VITAMIN D) 600-800 MG-UNIT TABS Take 1 tablet by mouth 2 (two) times daily.    . Coenzyme Q10 (COQ10) 200 MG CAPS Take 200 mg by mouth daily.    [name] Kitchen denosumab (XGEVA) 120 MG/1.7ML SOLN injection Inject 120 mg into the skin every 30 (thirty) days.    . DOCETAXEL IV Inject into the vein every 21 ( twenty-one) days.    . fluticasone-salmeterol (ADVAIR HFA) 115-21 MCG/ACT inhaler Inhale 2 puffs into the lungs 2 (two) times daily.     [name] Kitchen ibuprofen (ADVIL) 200 MG tablet Take 200 mg by mouth every 6 (six) hours as needed for headache or mild pain.    . Ibuprofen-diphenhydrAMINE HCl (IBUPROFEN PM) 200-25 MG CAPS Take 1 tablet by mouth at bedtime as needed (sleep).    [name] Kitchen KRILL OIL PO Take 1 capsule by mouth daily.    [name] Kitchen Leuprolide Acetate, 3 Month, (LUPRON DEPOT, 1-MONTH, IM) Inject 1 Dose into the muscle every 3 (three) months.    . lidocaine-prilocaine (EMLA) cream Apply a small amount to port a cath site and cover with plastic wrap 1 hour prior to chemotherapy appointments 30 g 3  . losartan (COZAAR) 25 MG tablet Take 0.5 tablets (12.5 mg total) by mouth daily. 45 tablet 3  . Lutein 20 MG CAPS Take 20 mg by mouth daily.    . methimazole (TAPAZOLE) 10 MG tablet Take 10 mg by mouth every other day.     . metoprolol succinate (TOPROL-XL) 25 MG 24 hr tablet TAKE 1 TABLET BY MOUTH EVERY DAY (Patient taking differently: Take 25 mg by mouth daily.) 90 tablet 1  . Multiple Vitamin (MULTIVITAMIN) tablet Take 1 tablet by mouth daily.    . nitroGLYCERIN (NITROSTAT) 0.4 MG SL tablet Place 1 tablet (0.4 mg total) under the tongue every 5 (five) minutes as needed. 25 tablet 12  . Potassium 99 MG TABS Take 99 mg by mouth daily.    . predniSONE (DELTASONE) 5 MG tablet Take 1 tablet (5 mg total)  by mouth daily with breakfast. 60 tablet 11  . prochlorperazine (COMPAZINE) 10 MG tablet Take 1 tablet (10 mg total) by  mouth every 6 (six) hours as needed (Nausea or vomiting). 30 tablet 1  . ticagrelor (BRILINTA) 90 MG TABS tablet Take 1 tablet (90 mg total) by mouth 2 (two) times daily. 60 tablet 11  . Trolamine Salicylate (ASPERCREME EX) Apply 1 application topically daily as needed (pain).     No current facility-administered medications for this visit.    ALLERGIES:  Allergies  Allergen Reactions  . Xtandi [Enzalutamide] Other (See Comments)    "Severe personality change" and blood clots  . Penicillins Hives    PHYSICAL EXAM:  Performance status (ECOG): 1 - Symptomatic but completely ambulatory  There were no vitals filed for this visit. Wt Readings from Last 3 Encounters:  07/22/20 252 lb 12.8 oz (114.7 kg)  07/14/20 253 lb 14.4 oz (115.2 kg)  07/08/20 254 lb (115.2 kg)   Physical Exam Vitals reviewed.  Constitutional:      Appearance: Normal appearance.  Cardiovascular:     Rate and Rhythm: Normal rate and regular rhythm.     Heart sounds: Normal heart sounds.  Pulmonary:     Effort: Pulmonary effort is normal.     Breath sounds: Normal breath sounds.  Chest:     Comments: Port-a-Cath in R chest Abdominal:     Palpations: Abdomen is soft.  Musculoskeletal:        General: Swelling present.  Skin:    General: Skin is warm.  Neurological:     General: No focal deficit present.     Mental Status: He is alert and oriented to person, place, and time.  Psychiatric:        Mood and Affect: Mood normal.        Behavior: Behavior normal.     LABORATORY DATA:  I have reviewed the labs as listed.  CBC Latest Ref Rng & Units 07/14/2020 07/07/2020 06/23/2020  WBC 4.0 - 10.5 K/uL 7.1 11.1(H) 7.6  Hemoglobin 13.0 - 17.0 g/dL 11.6(L) 11.4(L) 12.7(L)  Hematocrit 39.0 - 52.0 % 34.0(L) 34.2(L) 36.4(L)  Platelets 150 - 400 K/uL 194 155 144(L)   CMP Latest Ref Rng & Units  07/14/2020 07/07/2020 06/23/2020  Glucose 70 - 99 mg/dL 104(H) 123(H) 109(H)  BUN 8 - 23 mg/dL 25(H) 32(H) 32(H)  Creatinine 0.61 - 1.24 mg/dL 1.14 1.33(H) 1.11  Sodium 135 - 145 mmol/L 136 138 136  Potassium 3.5 - 5.1 mmol/L 4.3 4.6 4.4  Chloride 98 - 111 mmol/L 108 109 106  CO2 22 - 32 mmol/L $RemoveB'23 23 23  'ResRTxuM$ Calcium 8.9 - 10.3 mg/dL 8.8(L) 9.0 9.1  Total Protein 6.5 - 8.1 g/dL 6.1(L) 6.3(L) 6.6  Total Bilirubin 0.3 - 1.2 mg/dL 0.7 0.7 0.8  Alkaline Phos 38 - 126 U/L 84 100 94  AST 15 - 41 U/L $Remo'25 27 25  'elsom$ ALT 0 - 44 U/L 28 32 24    DIAGNOSTIC IMAGING:  I have independently reviewed the scans and discussed with the patient. No results found.   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 -Abiraterone and prednisone from 06/16/2018 through 11/06/2019 with progression. -Xtandi started around 11/21/2019, discontinued on 12/05/2019 due to mood changes. -ST elevation MI involving the right coronary artery on 12/10/2019, status post cardiac catheterization showing thrombotic occlusion of the proximal RCA, successful PCI with stent placement. -Invitae testing was negativefor germline mutations. -Guardant 360 showed AR amplification. No other mutations. -Bicalutamide 50 mg dailyfrom 04/02/2020 through 05/12/2020 with progression. -Bone scan on 06/02/2020 with  increased activity throughout the sacrum with no other bony abnormalities. -CT CAP from 06/02/2020 with no visceral metastatic disease. -[place] is not compatible with Brilinta. -Docetaxel started on 06/23/2020.  Initial discussion was to reassess response after 6 cycles of chemotherapy.  If he has excellent response, and if he is not tolerating well, we may observe at that time.   PLAN:  1. 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  He had diarrhea for couple of days along with some tiredness. -No new onset pains.  Review of labs  shows normal LFTs.  White count and platelet count is adequate to proceed with cycle 3. -His PSA has increased to 30.8, likely from tumor lysis. -Proceed with cycle 3 today.  RTC 3 weeks with repeat bone scan and PSA. -Continue Lupron.  2. Bone metastasis: -Continue monthly denosumab.  3.Elevated blood sugars: -Continue Jardiance.  4. CADand stents: -Continue aspirin and Brilinta.  He is in the process of switching Brilinta to Plavix due to high co-pay.   Orders placed this encounter:  No orders of the defined types were placed in this encounter.    Derek Jack, MD [place] [phone]   I, Milinda Antis, am acting as a scribe for Dr. Sanda Linger.  I, Derek Jack MD, have reviewed the above documentation for accuracy and completeness, and I agree with the above.

## 2020-08-04 ENCOUNTER — Inpatient Hospital Stay (HOSPITAL_BASED_OUTPATIENT_CLINIC_OR_DEPARTMENT_OTHER): Payer: Medicare Other | Admitting: Hematology

## 2020-08-04 ENCOUNTER — Inpatient Hospital Stay (HOSPITAL_COMMUNITY): Payer: Medicare Other

## 2020-08-04 ENCOUNTER — Other Ambulatory Visit: Payer: Self-pay

## 2020-08-04 VITALS — BP 125/67 | HR 72 | Temp 97.0°F | Resp 18

## 2020-08-04 VITALS — BP 117/50 | HR 82 | Temp 97.0°F | Resp 20 | Wt 257.2 lb

## 2020-08-04 DIAGNOSIS — C61 Malignant neoplasm of prostate: Secondary | ICD-10-CM

## 2020-08-04 DIAGNOSIS — Z95828 Presence of other vascular implants and grafts: Secondary | ICD-10-CM

## 2020-08-04 DIAGNOSIS — Z5111 Encounter for antineoplastic chemotherapy: Secondary | ICD-10-CM | POA: Diagnosis not present

## 2020-08-04 LAB — COMPREHENSIVE METABOLIC PANEL
ALT: 27 U/L (ref 0–44)
AST: 25 U/L (ref 15–41)
Albumin: 3.5 g/dL (ref 3.5–5.0)
Alkaline Phosphatase: 73 U/L (ref 38–126)
Anion gap: 6 (ref 5–15)
BUN: 26 mg/dL — ABNORMAL HIGH (ref 8–23)
CO2: 25 mmol/L (ref 22–32)
Calcium: 9.1 mg/dL (ref 8.9–10.3)
Chloride: 108 mmol/L (ref 98–111)
Creatinine, Ser: 1.08 mg/dL (ref 0.61–1.24)
GFR, Estimated: >60 mL/min (ref >60)
Glucose, Bld: 154 mg/dL — ABNORMAL HIGH (ref 70–99)
Potassium: 4.4 mmol/L (ref 3.5–5.1)
Sodium: 139 mmol/L (ref 135–145)
Total Bilirubin: 0.6 mg/dL (ref 0.3–1.2)
Total Protein: 6.1 g/dL — ABNORMAL LOW (ref 6.5–8.1)

## 2020-08-04 LAB — CBC WITH DIFFERENTIAL/PLATELET
Abs Immature Granulocytes: 0.02 10*3/uL (ref 0.00–0.07)
Basophils Absolute: 0 10*3/uL (ref 0.0–0.1)
Basophils Relative: 0 %
Eosinophils Absolute: 0.1 10*3/uL (ref 0.0–0.5)
Eosinophils Relative: 1 %
HCT: 33.5 % — ABNORMAL LOW (ref 39.0–52.0)
Hemoglobin: 11.1 g/dL — ABNORMAL LOW (ref 13.0–17.0)
Immature Granulocytes: 0 %
Lymphocytes Relative: 9 %
Lymphs Abs: 0.8 10*3/uL (ref 0.7–4.0)
MCH: 34.6 pg — ABNORMAL HIGH (ref 26.0–34.0)
MCHC: 33.1 g/dL (ref 30.0–36.0)
MCV: 104.4 fL — ABNORMAL HIGH (ref 80.0–100.0)
Monocytes Absolute: 0.4 10*3/uL (ref 0.1–1.0)
Monocytes Relative: 5 %
Neutro Abs: 6.8 10*3/uL (ref 1.7–7.7)
Neutrophils Relative %: 85 %
Platelets: 162 10*3/uL (ref 150–400)
RBC: 3.21 MIL/uL — ABNORMAL LOW (ref 4.22–5.81)
RDW: 16 % — ABNORMAL HIGH (ref 11.5–15.5)
WBC: 8 10*3/uL (ref 4.0–10.5)
nRBC: 0 % (ref 0.0–0.2)

## 2020-08-04 LAB — PSA: Prostatic Specific Antigen: 30.86 ng/mL — ABNORMAL HIGH (ref 0.00–4.00)

## 2020-08-04 LAB — MAGNESIUM: Magnesium: 1.8 mg/dL (ref 1.7–2.4)

## 2020-08-04 MED ORDER — HEPARIN SOD (PORK) LOCK FLUSH 100 UNIT/ML IV SOLN
500.0000 [IU] | Freq: Once | INTRAVENOUS | Status: AC | PRN
  Administered 2020-08-04: 500 [IU]

## 2020-08-04 MED ORDER — DENOSUMAB 120 MG/1.7ML ~~LOC~~ SOLN
SUBCUTANEOUS | Status: AC
  Filled 2020-08-04: qty 1.7

## 2020-08-04 MED ORDER — ONDANSETRON HCL 4 MG/2ML IJ SOLN: 8.0000 mg | Freq: Once | INTRAMUSCULAR | Status: DC

## 2020-08-04 MED ORDER — LEUPROLIDE ACETATE (3 MONTH) 22.5 MG ~~LOC~~ KIT
22.5000 mg | PACK | Freq: Once | SUBCUTANEOUS | Status: AC
  Administered 2020-08-04: 22.5 mg via SUBCUTANEOUS
  Filled 2020-08-04: qty 22.5

## 2020-08-04 MED ORDER — SODIUM CHLORIDE 0.9 % IV SOLN
Freq: Once | INTRAVENOUS | Status: AC
  Administered 2020-08-04: 8 mg via INTRAVENOUS
  Filled 2020-08-04: qty 4

## 2020-08-04 MED ORDER — SODIUM CHLORIDE 0.9% FLUSH
10.0000 mL | INTRAVENOUS | Status: DC | PRN
  Administered 2020-08-04: 10 mL

## 2020-08-04 MED ORDER — SODIUM CHLORIDE 0.9 % IV SOLN: Freq: Once | INTRAVENOUS | Status: AC

## 2020-08-04 MED ORDER — DENOSUMAB 120 MG/1.7ML ~~LOC~~ SOLN
120.0000 mg | Freq: Once | SUBCUTANEOUS | Status: AC
  Administered 2020-08-04: 120 mg via SUBCUTANEOUS

## 2020-08-04 MED ORDER — SODIUM CHLORIDE 0.9 % IV SOLN
75.0000 mg/m2 | Freq: Once | INTRAVENOUS | Status: AC
  Administered 2020-08-04: 180 mg via INTRAVENOUS
  Filled 2020-08-04: qty 18

## 2020-08-04 MED ORDER — SODIUM CHLORIDE 0.9 % IV SOLN
10.0000 mg | Freq: Once | INTRAVENOUS | Status: AC
  Administered 2020-08-04: 10 mg via INTRAVENOUS
  Filled 2020-08-04: qty 10

## 2020-08-04 NOTE — Patient Instructions (Signed)
[facility] at [facility] Discharge Instructions  You were seen and examined today by Dr. Katragadda. Please follow up as scheduled.    Thank you for choosing [facility] at [facility] to provide your oncology and hematology care.  To afford each patient quality time with our provider, please arrive at least 15 minutes before your scheduled appointment time.   If you have a lab appointment with the Cancer Center please come in thru the Main Entrance and check in at the main information desk.  You need to re-schedule your appointment should you arrive 10 or more minutes late.  We strive to give you quality time with our providers, and arriving late affects you and other patients whose appointments are after yours.  Also, if you no show three or more times for appointments you may be dismissed from the clinic at the providers discretion.     Again, thank you for choosing [facility].  Our hope is that these requests will decrease the amount of time that you wait before being seen by our physicians.       _____________________________________________________________  Should you have questions after your visit to  Cancer Center, please contact our office at [phone] and follow the prompts.  Our office hours are 8:00 a.m. and 4:30 p.m. Monday - Friday.  Please note that voicemails left after 4:00 p.m. may not be returned until the following business day.  We are closed weekends and major holidays.  You do have access to a nurse 24-7, just call the main number to the clinic [phone] and do not press any options, hold on the line and a nurse will answer the phone.    For prescription refill requests, have your pharmacy contact our office and allow 72 hours.    Due to Covid, you will need to wear a mask upon entering the hospital. If you do not have a mask, a mask will be given to you at the Main Entrance upon arrival. For  doctor visits, patients may have 1 support person age 18 or older with them. For treatment visits, patients can not have anyone with them due to social distancing guidelines and our immunocompromised population.      

## 2020-08-04 NOTE — Progress Notes (Signed)
Patient was assessed by Dr. Katragadda and labs have been reviewed.  Patient is okay to proceed with treatment today. Primary RN and pharmacy aware.   

## 2020-08-04 NOTE — Progress Notes (Signed)
Phillips Climes presents today for D1C3 Docetaxel, Xgeva, and Eligard. Pt denies any new changes or symptoms since last treatment. Lab results and vitals have been reviewed and are stable and within parameters for treatment. Pt reports taking Ca and Vit D as instructed. Pt denies tooth/jaw pain and denies recent or future invasive dental work. Patient has been assessed by Dr. Delton Coombes who has approved proceeding with treatment today as planned.  Injections and infusions tolerated without incident or complaint. VSS upon completion of treatment. Port flushed and deaccessed per protocol, see MAR and IV flowsheet for details. Discharged in satisfactory condition with follow up instructions.

## 2020-08-04 NOTE — Patient Instructions (Signed)
[facility] Discharge Instructions for Patients Receiving Chemotherapy   Beginning January 23rd 2017 lab work for the [facility] will be done in the  Main lab at [facility] on 1st floor. If you have a lab appointment with the [facility] please come in thru the  Main Entrance and check in at the main information desk   Today you received the following chemotherapy agents Docetaxel, Xgeva, and Lupron  To help prevent nausea and vomiting after your treatment, we encourage you to take your nausea medication   If you develop nausea and vomiting, or diarrhea that is not controlled by your medication, call the clinic.  The clinic phone number is [phone]. Office hours are Monday-Friday 8:30am-5:00pm.  BELOW ARE SYMPTOMS THAT SHOULD BE REPORTED IMMEDIATELY:  *FEVER GREATER THAN 101.0 F  *CHILLS WITH OR WITHOUT FEVER  NAUSEA AND VOMITING THAT IS NOT CONTROLLED WITH YOUR NAUSEA MEDICATION  *UNUSUAL SHORTNESS OF BREATH  *UNUSUAL BRUISING OR BLEEDING  TENDERNESS IN MOUTH AND THROAT WITH OR WITHOUT PRESENCE OF ULCERS  *URINARY PROBLEMS  *BOWEL PROBLEMS  UNUSUAL RASH Items with * indicate a potential emergency and should be followed up as soon as possible. If you have an emergency after office hours please contact your primary care physician or go to the nearest emergency department.  Please call the clinic during office hours if you have any questions or concerns.   You may also contact the Patient Navigator at [phone] should you have any questions or need assistance in obtaining follow up care.      Resources For Cancer Patients and their Caregivers ? American Cancer Society: Can assist with transportation, wigs, general needs, runs Look Good Feel Better.        640-522-7503 ? Cancer Care: Provides financial assistance, online support groups, medication/co-pay assistance.  1-800-813-HOPE (650)350-8304) ? [employer] Assists  [place] cancer patients and their families through emotional , educational and financial support.  214-237-2958 ? [place] DSS Where to apply for food stamps, Medicaid and utility assistance. 332-124-5972 ? [facility]: Transportation to medical appointments. 256-244-9446 ? Social Security Administration: May apply for disability if have a Stage IV cancer. 631-761-0768 [phone] ? [employer], Disability and Transit Services: Assists with nutrition, care and transit needs. 984-161-9019

## 2020-08-06 ENCOUNTER — Inpatient Hospital Stay (HOSPITAL_COMMUNITY): Payer: Medicare Other

## 2020-08-06 ENCOUNTER — Other Ambulatory Visit: Payer: Self-pay

## 2020-08-06 ENCOUNTER — Encounter (HOSPITAL_COMMUNITY): Payer: Self-pay

## 2020-08-06 VITALS — BP 120/57 | HR 84 | Temp 96.9°F | Resp 18 | Wt 255.3 lb

## 2020-08-06 DIAGNOSIS — Z5111 Encounter for antineoplastic chemotherapy: Secondary | ICD-10-CM | POA: Diagnosis not present

## 2020-08-06 DIAGNOSIS — C61 Malignant neoplasm of prostate: Secondary | ICD-10-CM

## 2020-08-06 DIAGNOSIS — Z95828 Presence of other vascular implants and grafts: Secondary | ICD-10-CM

## 2020-08-06 MED ORDER — PEGFILGRASTIM-JMDB 6 MG/0.6ML ~~LOC~~ SOSY
6.0000 mg | PREFILLED_SYRINGE | Freq: Once | SUBCUTANEOUS | Status: AC
  Administered 2020-08-06: 6 mg via SUBCUTANEOUS
  Filled 2020-08-06: qty 0.6

## 2020-08-06 NOTE — Progress Notes (Signed)
Patient tolerated injection with no complaints voiced.  Site clean and dry with no bruising or swelling noted at site.  Band aid applied.  Vss with discharge and left in satisfactory condition with no s/s of distress noted.  

## 2020-08-12 ENCOUNTER — Telehealth: Payer: Self-pay | Admitting: Cardiology

## 2020-08-12 NOTE — Telephone Encounter (Signed)
Pt was at cardiac rehab and stated that his BP has been running low and when he's been at the cancer clinic. They will not do treatments till it gets up to a certain number.  He also has questions about his medication.   Please call [phone]

## 2020-08-12 NOTE — Telephone Encounter (Signed)
Advised to stay well hydrated, change positions slowly, continue to wear compression stockings and check BP daily for the next week, recording numbers and call office with result. Advised if symptoms got worse, to go to the ED for an evaluation. Verbalized understanding of plan.

## 2020-08-12 NOTE — Telephone Encounter (Signed)
Reports that Cardiac Rehab BP's have been low as well as [facility] visits. Patient is unable to give numbers. Reports he notices that he has dizziness when he stands up or bends over. Reports dizziness was discussed at last office visit and is unchanged. Reports he does not take his BP at home but does have a monitor. Medication reviewed. Reports he has not taken losartan since July 2021. Medication profile updated. Says he will start taking his BP.

## 2020-08-19 ENCOUNTER — Encounter (HOSPITAL_COMMUNITY)
Admission: RE | Admit: 2020-08-19 | Discharge: 2020-08-19 | Disposition: A | Discharge status: Home / Self Care | Payer: Medicare Other | Source: Ambulatory Visit | Attending: Hematology | Admitting: Hematology

## 2020-08-19 DIAGNOSIS — C61 Malignant neoplasm of prostate: Secondary | ICD-10-CM | POA: Diagnosis not present

## 2020-08-19 MED ORDER — TECHNETIUM TC 99M MEDRONATE IV KIT
20.0000 | PACK | Freq: Once | INTRAVENOUS | Status: AC | PRN
  Administered 2020-08-19: 20.3 via INTRAVENOUS

## 2020-08-25 ENCOUNTER — Inpatient Hospital Stay (HOSPITAL_BASED_OUTPATIENT_CLINIC_OR_DEPARTMENT_OTHER): Payer: Medicare Other | Admitting: Hematology

## 2020-08-25 ENCOUNTER — Inpatient Hospital Stay (HOSPITAL_COMMUNITY): Payer: Medicare Other | Attending: Hematology

## 2020-08-25 ENCOUNTER — Other Ambulatory Visit: Payer: Self-pay

## 2020-08-25 ENCOUNTER — Inpatient Hospital Stay (HOSPITAL_COMMUNITY): Payer: Medicare Other

## 2020-08-25 ENCOUNTER — Other Ambulatory Visit (HOSPITAL_COMMUNITY): Payer: Self-pay

## 2020-08-25 VITALS — BP 119/57 | HR 72 | Temp 97.7°F | Resp 18

## 2020-08-25 VITALS — BP 119/54 | HR 73 | Temp 96.9°F | Resp 18 | Wt 255.2 lb

## 2020-08-25 DIAGNOSIS — C61 Malignant neoplasm of prostate: Secondary | ICD-10-CM

## 2020-08-25 DIAGNOSIS — C7951 Secondary malignant neoplasm of bone: Secondary | ICD-10-CM | POA: Diagnosis present

## 2020-08-25 DIAGNOSIS — Z5189 Encounter for other specified aftercare: Secondary | ICD-10-CM | POA: Diagnosis not present

## 2020-08-25 DIAGNOSIS — Z79899 Other long term (current) drug therapy: Secondary | ICD-10-CM | POA: Insufficient documentation

## 2020-08-25 DIAGNOSIS — Z95828 Presence of other vascular implants and grafts: Secondary | ICD-10-CM

## 2020-08-25 DIAGNOSIS — Z5111 Encounter for antineoplastic chemotherapy: Secondary | ICD-10-CM | POA: Diagnosis present

## 2020-08-25 LAB — COMPREHENSIVE METABOLIC PANEL
ALT: 26 U/L (ref 0–44)
AST: 26 U/L (ref 15–41)
Albumin: 3.5 g/dL (ref 3.5–5.0)
Alkaline Phosphatase: 77 U/L (ref 38–126)
Anion gap: 7 (ref 5–15)
BUN: 26 mg/dL — ABNORMAL HIGH (ref 8–23)
CO2: 23 mmol/L (ref 22–32)
Calcium: 8.8 mg/dL — ABNORMAL LOW (ref 8.9–10.3)
Chloride: 109 mmol/L (ref 98–111)
Creatinine, Ser: 1.03 mg/dL (ref 0.61–1.24)
GFR, Estimated: >60 mL/min (ref >60)
Glucose, Bld: 116 mg/dL — ABNORMAL HIGH (ref 70–99)
Potassium: 4.3 mmol/L (ref 3.5–5.1)
Sodium: 139 mmol/L (ref 135–145)
Total Bilirubin: 0.5 mg/dL (ref 0.3–1.2)
Total Protein: 6.1 g/dL — ABNORMAL LOW (ref 6.5–8.1)

## 2020-08-25 LAB — CBC WITH DIFFERENTIAL/PLATELET
Abs Immature Granulocytes: 0.03 10*3/uL (ref 0.00–0.07)
Basophils Absolute: 0.1 10*3/uL (ref 0.0–0.1)
Basophils Relative: 1 %
Eosinophils Absolute: 0.1 10*3/uL (ref 0.0–0.5)
Eosinophils Relative: 1 %
HCT: 35.8 % — ABNORMAL LOW (ref 39.0–52.0)
Hemoglobin: 11.7 g/dL — ABNORMAL LOW (ref 13.0–17.0)
Immature Granulocytes: 0 %
Lymphocytes Relative: 13 %
Lymphs Abs: 1.2 10*3/uL (ref 0.7–4.0)
MCH: 34 pg (ref 26.0–34.0)
MCHC: 32.7 g/dL (ref 30.0–36.0)
MCV: 104.1 fL — ABNORMAL HIGH (ref 80.0–100.0)
Monocytes Absolute: 0.5 10*3/uL (ref 0.1–1.0)
Monocytes Relative: 6 %
Neutro Abs: 7 10*3/uL (ref 1.7–7.7)
Neutrophils Relative %: 79 %
Platelets: 178 10*3/uL (ref 150–400)
RBC: 3.44 MIL/uL — ABNORMAL LOW (ref 4.22–5.81)
RDW: 15.5 % (ref 11.5–15.5)
WBC: 8.8 10*3/uL (ref 4.0–10.5)
nRBC: 0 % (ref 0.0–0.2)

## 2020-08-25 LAB — PSA: Prostatic Specific Antigen: 29.53 ng/mL — ABNORMAL HIGH (ref 0.00–4.00)

## 2020-08-25 MED ORDER — SODIUM CHLORIDE 0.9 % IV SOLN: Freq: Once | INTRAVENOUS | Status: AC

## 2020-08-25 MED ORDER — SODIUM CHLORIDE 0.9 % IV SOLN
10.0000 mg | Freq: Once | INTRAVENOUS | Status: AC
  Administered 2020-08-25: 10 mg via INTRAVENOUS
  Filled 2020-08-25: qty 10

## 2020-08-25 MED ORDER — SODIUM CHLORIDE 0.9 % IV SOLN
75.0000 mg/m2 | Freq: Once | INTRAVENOUS | Status: AC
  Administered 2020-08-25: 180 mg via INTRAVENOUS
  Filled 2020-08-25: qty 18

## 2020-08-25 MED ORDER — ONDANSETRON HCL 4 MG/2ML IJ SOLN: 8.0000 mg | Freq: Once | INTRAMUSCULAR | Status: DC

## 2020-08-25 MED ORDER — HEPARIN SOD (PORK) LOCK FLUSH 100 UNIT/ML IV SOLN
500.0000 [IU] | Freq: Once | INTRAVENOUS | Status: AC | PRN
  Administered 2020-08-25: 500 [IU]

## 2020-08-25 MED ORDER — SODIUM CHLORIDE 0.9% FLUSH
10.0000 mL | INTRAVENOUS | Status: DC | PRN
  Administered 2020-08-25: 10 mL

## 2020-08-25 MED ORDER — SODIUM CHLORIDE 0.9 % IV SOLN
Freq: Once | INTRAVENOUS | Status: AC
  Administered 2020-08-25: 8 mg via INTRAVENOUS
  Filled 2020-08-25: qty 4

## 2020-08-25 NOTE — Patient Instructions (Signed)
[place] at [facility] Discharge Instructions  You were seen today by Dr. Delton Coombes. He went over your recent results and scans. You received your treatment today. You will receive your Xgeva injection next week; continue getting your Lupron every 3 months. Dr. Delton Coombes will see you back in 3 weeks for labs and follow up.   Thank you for choosing [place] at [facility] to provide your oncology and hematology care.  To afford each patient quality time with our provider, please arrive at least 15 minutes before your scheduled appointment time.   If you have a lab appointment with the [facility] please come in thru the Main Entrance and check in at the main information desk  You need to re-schedule your appointment should you arrive 10 or more minutes late.  We strive to give you quality time with our providers, and arriving late affects you and other patients whose appointments are after yours.  Also, if you no show three or more times for appointments you may be dismissed from the clinic at the providers discretion.     Again, thank you for choosing [facility].  Our hope is that these requests will decrease the amount of time that you wait before being seen by our physicians.       _____________________________________________________________  Should you have questions after your visit to [facility], please contact our office at [phone] between the hours of 8:00 a.m. and 4:30 p.m.  Voicemails left after 4:00 p.m. will not be returned until the following business day.  For prescription refill requests, have your pharmacy contact our office and allow 72 hours.    Cancer Center Support Programs:   > Cancer Support Group  2nd Tuesday of the month 1pm-2pm, Journey Room

## 2020-08-25 NOTE — Patient Instructions (Signed)
[facility] Discharge Instructions for Patients Receiving Chemotherapy  Today you received the following chemotherapy agents taxotere.  Follow up as scheduled.   To help prevent nausea and vomiting after your treatment, we encourage you to take your nausea medication    If you develop nausea and vomiting that is not controlled by your nausea medication, call the clinic.   BELOW ARE SYMPTOMS THAT SHOULD BE REPORTED IMMEDIATELY:  *FEVER GREATER THAN 100.5 F  *CHILLS WITH OR WITHOUT FEVER  NAUSEA AND VOMITING THAT IS NOT CONTROLLED WITH YOUR NAUSEA MEDICATION  *UNUSUAL SHORTNESS OF BREATH  *UNUSUAL BRUISING OR BLEEDING  TENDERNESS IN MOUTH AND THROAT WITH OR WITHOUT PRESENCE OF ULCERS  *URINARY PROBLEMS  *BOWEL PROBLEMS  UNUSUAL RASH Items with * indicate a potential emergency and should be followed up as soon as possible.  Feel free to call the clinic should you have any questions or concerns. The clinic phone number is [phone].  Please show the [place] at check-in to the Emergency Department and triage nurse.

## 2020-08-25 NOTE — Progress Notes (Signed)
Pt here for D1C4 of taxotere.  Vital signs and labs WNL for treatment today.  Tolerated treatment well today without incidence.  Vital signs stable prior to discharge.  Discharged in stable condition ambulatory.

## 2020-08-25 NOTE — Progress Notes (Signed)
Patient was assessed by Dr. Katragadda and labs have been reviewed. Treatment pending labs. Primary RN and pharmacy aware.   

## 2020-08-25 NOTE — Progress Notes (Signed)
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 Radical resection of prostate on 06/27/2014. 2. Zytiga from 05/19/2018 to 11/09/2019. 3. Xtandi from 11/21/2019 to 12/05/2019, stopped due to mood changes.  NGS Results: Guardant AR amplification, MSI normal, TMB 16 Muts/Mb  CURRENT THERAPY: Docetaxel every 3 weeks; Xgeva monthly; Lupron every 3 months  BRIEF ONCOLOGIC HISTORY:  Oncology History  Prostate cancer ([place])  02/06/2014 Procedure   Prostate biopsy by Dr. Clyde Lundborg   02/11/2014 Pathology Results   Prostatic adenocarcinoma identified in 5 of 6 prostate specimens with Gleason pattern showing primary pattern-grade 4, secondary pattern grade 3.  Total Gleason score equals 7.  Proportion of prostatic tissue involved by tumor: 70%.   05/03/2014 Imaging   Bone scan- Negative    06/27/2014 Procedure   Radical resection of prostate by Dr. Raynelle Bring   07/29/2014 Pathology Results   1. Prostate, radical resection - PROSTATIC ADENOCARCINOMA, GLEASON SCORE 4 + 3 = 7. - RIGHT AND LEFT PROSTATE INVOLVED. - RIGHT AND LEFT SEMINAL VESICLES INVOLVED BY TUMOR. - EXTRACAPSULAR EXTENSION BY TUMOR. - TUMOR EXTENDS INTO BLADDER NECK TISSUE. - MARGINS NOT INVOLVED. 2. Lymph nodes, regional resection, left pelvic - THREE BENIGN LYMPH NODES (0/3). 3. Lymph nodes, regional resection, right pelvic - FOUR BENIGN LYMPH NODES (0/4).   11/16/2016 Imaging   Bone scan- New areas of increased uptake superolateral to the left orbit (faint), at S1, and in the posterolateral aspect of the left sixth rib.    11/25/2016 Imaging   CT CAP- 1. Status post radical prostatectomy. No findings to suggest local recurrence of disease or definite extraskeletal metastatic disease in the  chest, abdomen or pelvis. However, there are several osseous lesions, as above, concerning for metastatic disease to the bones. 2. Hepatic steatosis. 3. Aortic atherosclerosis, in addition to 2 vessel coronary artery disease. Please note that although the presence of coronary artery calcium documents the presence of coronary artery disease, the severity of this disease and any potential stenosis cannot be assessed on this non-gated CT examination. Assessment for potential risk factor modification, dietary therapy or pharmacologic therapy may be warranted, if clinically indicated. 4. Additional incidental findings, as above.    Genetic Testing   Guardant 360 Results:         06/23/2020 -  Chemotherapy    Patient is on Treatment Plan: PROSTATE DOCETAXEL + PREDNISONE Q21D        CANCER STAGING: Cancer Staging Prostate cancer [facility]) Staging form: Prostate, AJCC 7th Edition - Pathologic stage from 07/29/2014: Stage III (T3b, N0, cM0, Gleason 7) - Signed by Baird Cancer, PA-C on 11/09/2016 - Clinical: Stage IV (T3, N0, M1, PSA: Less than 10, Gleason 7) - Signed by [name], MD on 12/21/2016 - Pathologic: No stage assigned - Unsigned   INTERVAL HISTORY:  Hunter Jones, a 65 y.o. male, returns for routine follow-up and consideration for next cycle of chemotherapy. Hunter Jones was last seen on 08/04/2020.  Due for cycle #4 of docetaxel today.   Overall, he tells me he has been feeling okay. He tolerated the previous treatment well. He continues doing cardiac rehab on Tuesday and Thursday and notes that he felt dizzy with low diastolic BP even though he takes prednisone. He also feels  lightheaded if he stands up too quickly, especially a week after chemo. He has mild nausea which does not require meds. He denies having any numbness or tingling. He drinks plenty of water daily.  Overall, he feels ready for next cycle of chemo today.    REVIEW OF SYSTEMS:  Review of Systems   Constitutional: Positive for fatigue (50%). Negative for appetite change.  Gastrointestinal: Positive for nausea (mild).  Neurological: Positive for light-headedness (upon standing quickly). Negative for numbness.  All other systems reviewed and are negative.   PAST MEDICAL/SURGICAL HISTORY:  Past Medical History:  Diagnosis Date  . Acute ST elevation myocardial infarction (STEMI) of inferior wall ([facility]) 11/2019  . Asthma   . COPD (chronic obstructive pulmonary disease) ([place])   . Coronary artery disease    DES to mid LAD August 2014 ([facility]); overlapping DESx2 prox-mid RCA 11/2019  . Essential hypertension   . Hyperthyroidism   . NSTEMI (non-ST elevated myocardial infarction) ([facility]) 2014  . Port-A-Cath in place 06/17/2020  . Prostate cancer ([place])   . Type 2 diabetes mellitus ([facility])    Past Surgical History:  Procedure Laterality Date  . APPENDECTOMY    . CORONARY/GRAFT ACUTE MI REVASCULARIZATION N/A 12/10/2019   Procedure: Coronary/Graft Acute MI Revascularization;  Surgeon: Nelva Bush, MD;  Location: [facility];  Service: Cardiovascular;  Laterality: N/A;  . IR IMAGING GUIDED PORT INSERTION  06/19/2020  . LEFT HEART CATH AND CORONARY ANGIOGRAPHY N/A 12/10/2019   Procedure: LEFT HEART CATH AND CORONARY ANGIOGRAPHY;  Surgeon: Nelva Bush, MD;  Location: [facility];  Service: Cardiovascular;  Laterality: N/A;  . LYMPHADENECTOMY Bilateral 06/27/2014   Procedure: LYMPHADENECTOMY;  Surgeon: Raynelle Bring, MD;  Location: [facility];  Service: Urology;  Laterality: Bilateral;  . ROBOT ASSISTED LAPAROSCOPIC RADICAL PROSTATECTOMY N/A 06/27/2014   Procedure: ROBOTIC ASSISTED LAPAROSCOPIC RADICAL PROSTATECTOMY LEVEL 3;  Surgeon: Raynelle Bring, MD;  Location: [facility];  Service: Urology;  Laterality: N/A;  . TONSILLECTOMY      SOCIAL HISTORY:  Social History   Socioeconomic History  . Marital status: Married    Spouse name: Not on file  . Number of children: Not on file  .  Years of education: Not on file  . Highest education level: Not on file  Occupational History  . Not on file  Tobacco Use  . Smoking status: Former Smoker    Years: 40.00    Types: Cigarettes    Quit date: 06/21/2012    Years since quitting: 8.1  . Smokeless tobacco: Never Used  Vaping Use  . Vaping Use: Never used  Substance and Sexual Activity  . Alcohol use: Yes    Comment: occasional  . Drug use: No  . Sexual activity: Not on file  Other Topics Concern  . Not on file  Social History Narrative  . Not on file   Social Determinants of Health   Financial Resource Strain: Low Risk   . Difficulty of Paying Living Expenses: Not hard at all  Food Insecurity: No Food Insecurity  . Worried About Charity fundraiser in the Last Year: Never true  . Ran Out of Food in the Last Year: Never true  Transportation Needs: No Transportation Needs  . Lack of Transportation (Medical): No  . Lack of Transportation (Non-Medical): No  Physical Activity: Inactive  . Days of Exercise per Week: 0 days  . Minutes of Exercise per Session: 0 min  Stress: No Stress Concern Present  . Feeling of  Stress : Not at all  Social Connections: Moderately Integrated  . Frequency of Communication with Friends and Family: More than three times a week  . Frequency of Social Gatherings with Friends and Family: Never  . Attends Religious Services: Never  . Active Member of Clubs or Organizations: Yes  . Attends Banker Meetings: Never  . Marital Status: Married  Catering manager Violence: Not At Risk  . Fear of Current or Ex-Partner: No  . Emotionally Abused: No  . Physically Abused: No  . Sexually Abused: No    FAMILY HISTORY:  Family History  Problem Relation Age of Onset  . Dementia Mother   . Thyroid disease Mother   . Clotting disorder Mother   . Kidney Stones Father   . Stroke Brother   . Alzheimer's disease Maternal Uncle   . Tuberculosis Paternal Grandmother   . Heart attack  Maternal Uncle        x4  . Heart attack Cousin        in his 58s    CURRENT MEDICATIONS:  Current Outpatient Medications  Medication Sig Dispense Refill  . albuterol (PROVENTIL HFA;VENTOLIN HFA) 108 (90 BASE) MCG/ACT inhaler Inhale 1-2 puffs into the lungs every 6 (six) hours as needed for wheezing or shortness of breath.    . Ascorbic Acid (VITAMIN C PO) Take 1 tablet by mouth daily.    [name] Kitchen aspirin EC 81 MG tablet Take 81 mg by mouth 3 (three) times a week.    [name] Kitchen atorvastatin (LIPITOR) 40 MG tablet TAKE 1 TABLET BY MOUTH AT BEDTIME. (Patient taking differently: Take 40 mg by mouth at bedtime.) 30 tablet 6  . Calcium Carb-Cholecalciferol (HM CALCIUM-VITAMIN D) 600-800 MG-UNIT TABS Take 1 tablet by mouth 2 (two) times daily.    . Coenzyme Q10 (COQ10) 200 MG CAPS Take 200 mg by mouth daily.    [name] Kitchen denosumab (XGEVA) 120 MG/1.7ML SOLN injection Inject 120 mg into the skin every 30 (thirty) days.    . DOCETAXEL IV Inject into the vein every 21 ( twenty-one) days.    . fluticasone-salmeterol (ADVAIR HFA) 115-21 MCG/ACT inhaler Inhale 2 puffs into the lungs 2 (two) times daily.     [name] Kitchen ibuprofen (ADVIL) 200 MG tablet Take 200 mg by mouth every 6 (six) hours as needed for headache or mild pain.    . Ibuprofen-diphenhydrAMINE HCl (IBUPROFEN PM) 200-25 MG CAPS Take 1 tablet by mouth at bedtime as needed (sleep).    [name] Kitchen KRILL OIL PO Take 1 capsule by mouth daily.    [name] Kitchen Leuprolide Acetate, 3 Month, (LUPRON DEPOT, 42-MONTH, IM) Inject 1 Dose into the muscle every 3 (three) months.    . lidocaine-prilocaine (EMLA) cream Apply a small amount to port a cath site and cover with plastic wrap 1 hour prior to chemotherapy appointments 30 g 3  . Lutein 20 MG CAPS Take 20 mg by mouth daily.    . methimazole (TAPAZOLE) 10 MG tablet Take 10 mg by mouth every other day.     . metoprolol succinate (TOPROL-XL) 25 MG 24 hr tablet TAKE 1 TABLET BY MOUTH EVERY DAY (Patient taking differently: Take 25 mg by mouth daily.) 90 tablet  1  . Multiple Vitamin (MULTIVITAMIN) tablet Take 1 tablet by mouth daily.    . nitroGLYCERIN (NITROSTAT) 0.4 MG SL tablet Place 1 tablet (0.4 mg total) under the tongue every 5 (five) minutes as needed. 25 tablet 12  . Potassium 99 MG TABS Take 99 mg by mouth  daily.    . predniSONE (DELTASONE) 5 MG tablet Take 1 tablet (5 mg total) by mouth daily with breakfast. 60 tablet 11  . prochlorperazine (COMPAZINE) 10 MG tablet Take 1 tablet (10 mg total) by mouth every 6 (six) hours as needed (Nausea or vomiting). 30 tablet 1  . ticagrelor (BRILINTA) 90 MG TABS tablet Take 1 tablet (90 mg total) by mouth 2 (two) times daily. 60 tablet 11  . Trolamine Salicylate (ASPERCREME EX) Apply 1 application topically daily as needed (pain).     No current facility-administered medications for this visit.    ALLERGIES:  Allergies  Allergen Reactions  . Xtandi [Enzalutamide] Other (See Comments)    "Severe personality change" and blood clots  . Penicillins Hives    PHYSICAL EXAM:  Performance status (ECOG): 1 - Symptomatic but completely ambulatory  Vitals:   08/25/20 0833  BP: (!) 119/54  Pulse: 73  Resp: 18  Temp: (!) 96.9 F (36.1 C)  SpO2: 100%   Wt Readings from Last 3 Encounters:  08/25/20 255 lb 3.2 oz (115.8 kg)  08/06/20 255 lb 4.7 oz (115.8 kg)  08/04/20 257 lb 3.2 oz (116.7 kg)   Physical Exam Vitals reviewed.  Constitutional:      Appearance: Normal appearance. He is obese.  Cardiovascular:     Rate and Rhythm: Normal rate and regular rhythm.     Pulses: Normal pulses.     Heart sounds: Normal heart sounds.  Pulmonary:     Effort: Pulmonary effort is normal.     Breath sounds: Normal breath sounds.  Chest:     Comments: Port-a-Cath in R chest Neurological:     General: No focal deficit present.     Mental Status: He is alert and oriented to person, place, and time.  Psychiatric:        Mood and Affect: Mood normal.        Behavior: Behavior normal.     LABORATORY  DATA:  I have reviewed the labs as listed.  CBC Latest Ref Rng & Units 08/25/2020 08/04/2020 07/14/2020  WBC 4.0 - 10.5 K/uL 8.8 8.0 7.1  Hemoglobin 13.0 - 17.0 g/dL 11.7(L) 11.1(L) 11.6(L)  Hematocrit 39.0 - 52.0 % 35.8(L) 33.5(L) 34.0(L)  Platelets 150 - 400 K/uL 178 162 194   CMP Latest Ref Rng & Units 08/25/2020 08/04/2020 07/14/2020  Glucose 70 - 99 mg/dL 116(H) 154(H) 104(H)  BUN 8 - 23 mg/dL 26(H) 26(H) 25(H)  Creatinine 0.61 - 1.24 mg/dL 1.03 1.08 1.14  Sodium 135 - 145 mmol/L 139 139 136  Potassium 3.5 - 5.1 mmol/L 4.3 4.4 4.3  Chloride 98 - 111 mmol/L 109 108 108  CO2 22 - 32 mmol/L $RemoveB'23 25 23  'LrdGLCki$ Calcium 8.9 - 10.3 mg/dL 8.8(L) 9.1 8.8(L)  Total Protein 6.5 - 8.1 g/dL 6.1(L) 6.1(L) 6.1(L)  Total Bilirubin 0.3 - 1.2 mg/dL 0.5 0.6 0.7  Alkaline Phos 38 - 126 U/L 77 73 84  AST 15 - 41 U/L $Remo'26 25 25  'VaXMY$ ALT 0 - 44 U/L $Remo'26 27 28    'szqLt$ DIAGNOSTIC IMAGING:  I have independently reviewed the scans and discussed with the patient. NM Bone Scan Whole Body  Result Date: 08/20/2020 CLINICAL DATA:  Prostate cancer, surveillance. EXAM: NUCLEAR MEDICINE WHOLE BODY BONE SCAN TECHNIQUE: Whole body anterior and posterior images were obtained approximately 3 hours after intravenous injection of radiopharmaceutical. RADIOPHARMACEUTICALS:  20.3 mCi Technetium-41m MDP IV COMPARISON:  CT 06/02/2020.  Bone scan 06/02/2020. FINDINGS: Standard whole-body bone scan obtained.  Bilateral renal function and excretion present. Persistent unchanged increased activity noted in the sacrum. Other bony sclerotic lesions previously identified by CT do not demonstrate definite increased radiopharmaceutical uptake. Bone scan appears stable from prior exam. IMPRESSION: Persistent unchanged increased activity noted the sacrum. Other bony sclerotic lesions previously identified by CT do not demonstrate definite increased radiopharmaceutical uptake. Bone scan appears stable from prior exam. Electronically Signed   By: Marcello Moores  Register   On:  08/20/2020 05:40     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 -Abiraterone and prednisone from 06/16/2018 through 11/06/2019 with progression. -Xtandi started around 11/21/2019, discontinued on 12/05/2019 due to mood changes. -ST elevation MI involving the right coronary artery on 12/10/2019, status post cardiac catheterization showing thrombotic occlusion of the proximal RCA, successful PCI with stent placement. -Invitae testing was negativefor germline mutations. -Guardant 360 showed AR amplification. No other mutations. -Bicalutamide 50 mg dailyfrom 04/02/2020 through 05/12/2020 with progression. -Bone scan on 06/02/2020 with increased activity throughout the sacrum with no other bony abnormalities. -CT CAP from 06/02/2020 with no visceral metastatic disease. -Hunter Jones is not compatible with Brilinta. -Docetaxel started on 06/23/2020. Initial discussion was to reassess response after 6 cycles of chemotherapy. If he has excellent response, and if he is not tolerating well, we may observe at that time.   PLAN:  1. 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 -Reviewed bone scan from 08/19/2020 after 3 cycles which showed stable disease.  We will follow up on PSA from today. -Reviewed labs from today which showed normal LFTs.  White count and platelet count is normal.  He is tolerating full dose docetaxel very well. -Proceed with cycle 4 today.  RTC 3 weeks for follow-up.  2. Bone metastasis: -Continue monthly denosumab.  3.Elevated blood sugars: -Continue Jardiance.  4. CADand stents: -Continue aspirin and Brilinta.  Is in the process of switching Brilinta to Plavix due to high co-pay.   Orders placed this encounter:  No orders of the defined types were placed in this encounter.    Hunter Jack, MD [place] [phone]   I, Hunter Jones, am acting as a scribe for Dr. Sanda Linger.  I, Hunter Jack MD, have reviewed the above documentation for accuracy and completeness, and I agree with the above.

## 2020-08-26 ENCOUNTER — Telehealth: Payer: Self-pay | Admitting: Cardiology

## 2020-08-26 ENCOUNTER — Encounter: Payer: Self-pay | Admitting: *Deleted

## 2020-08-26 DIAGNOSIS — I25119 Atherosclerotic heart disease of native coronary artery with unspecified angina pectoris: Secondary | ICD-10-CM

## 2020-08-26 DIAGNOSIS — Z01810 Encounter for preprocedural cardiovascular examination: Secondary | ICD-10-CM

## 2020-08-26 NOTE — Telephone Encounter (Signed)
Patient informed and verbalized understanding of plan. Lexiscan instructions read to patient and made available in mychart.

## 2020-08-26 NOTE — Telephone Encounter (Signed)
Patient typically seen in [place].  Will forward to [place] to have a [employer] scheduled on medical therapy.

## 2020-08-26 NOTE — Telephone Encounter (Signed)
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

## 2020-08-27 ENCOUNTER — Inpatient Hospital Stay (HOSPITAL_COMMUNITY): Payer: Medicare Other

## 2020-08-27 ENCOUNTER — Encounter (HOSPITAL_COMMUNITY): Payer: Self-pay

## 2020-08-27 VITALS — BP 121/50 | HR 86 | Temp 97.2°F | Resp 18

## 2020-08-27 DIAGNOSIS — Z5111 Encounter for antineoplastic chemotherapy: Secondary | ICD-10-CM | POA: Diagnosis not present

## 2020-08-27 DIAGNOSIS — C61 Malignant neoplasm of prostate: Secondary | ICD-10-CM

## 2020-08-27 DIAGNOSIS — Z95828 Presence of other vascular implants and grafts: Secondary | ICD-10-CM

## 2020-08-27 MED ORDER — PEGFILGRASTIM-JMDB 6 MG/0.6ML ~~LOC~~ SOSY
6.0000 mg | PREFILLED_SYRINGE | Freq: Once | SUBCUTANEOUS | Status: AC
  Administered 2020-08-27: 6 mg via SUBCUTANEOUS
  Filled 2020-08-27: qty 0.6

## 2020-08-27 NOTE — Progress Notes (Signed)
Patient tolerated injection with no complaints voiced.  Site clean and dry with no bruising or swelling noted at site.  See MAR for details.  Band aid applied.  Patient stable during and after injection.  Vss with discharge and left in satisfactory condition with no s/s of distress noted.  

## 2020-08-29 ENCOUNTER — Telehealth: Payer: Self-pay | Admitting: Cardiology

## 2020-08-29 MED ORDER — CLOPIDOGREL BISULFATE 75 MG PO TABS: 600.0000 mg | ORAL_TABLET | Freq: Once | ORAL | 3 refills | Status: AC

## 2020-08-29 NOTE — Telephone Encounter (Signed)
Pt called stating he has 2 weeks left of the Brilinta wants to go ahead and send Rx to [facility] for the Plavix

## 2020-09-01 ENCOUNTER — Other Ambulatory Visit (HOSPITAL_COMMUNITY): Payer: Medicare Other

## 2020-09-01 ENCOUNTER — Ambulatory Visit (HOSPITAL_COMMUNITY): Payer: Medicare Other | Admitting: Hematology

## 2020-09-01 ENCOUNTER — Ambulatory Visit (HOSPITAL_COMMUNITY): Payer: Medicare Other

## 2020-09-02 ENCOUNTER — Inpatient Hospital Stay (HOSPITAL_COMMUNITY): Payer: Medicare Other

## 2020-09-02 ENCOUNTER — Other Ambulatory Visit: Payer: Self-pay

## 2020-09-02 ENCOUNTER — Ambulatory Visit (HOSPITAL_COMMUNITY): Payer: Medicare Other

## 2020-09-02 ENCOUNTER — Telehealth: Payer: Self-pay | Admitting: Cardiology

## 2020-09-02 ENCOUNTER — Encounter (HOSPITAL_COMMUNITY): Payer: Self-pay

## 2020-09-02 VITALS — BP 128/58 | HR 93 | Temp 96.9°F | Resp 18 | Wt 253.8 lb

## 2020-09-02 DIAGNOSIS — C7951 Secondary malignant neoplasm of bone: Secondary | ICD-10-CM

## 2020-09-02 DIAGNOSIS — Z5111 Encounter for antineoplastic chemotherapy: Secondary | ICD-10-CM | POA: Diagnosis not present

## 2020-09-02 DIAGNOSIS — C61 Malignant neoplasm of prostate: Secondary | ICD-10-CM

## 2020-09-02 LAB — COMPREHENSIVE METABOLIC PANEL
ALT: 23 U/L (ref 0–44)
AST: 27 U/L (ref 15–41)
Albumin: 3.7 g/dL (ref 3.5–5.0)
Alkaline Phosphatase: 107 U/L (ref 38–126)
Anion gap: 8 (ref 5–15)
BUN: 26 mg/dL — ABNORMAL HIGH (ref 8–23)
CO2: 22 mmol/L (ref 22–32)
Calcium: 9.1 mg/dL (ref 8.9–10.3)
Chloride: 111 mmol/L (ref 98–111)
Creatinine, Ser: 1.19 mg/dL (ref 0.61–1.24)
GFR, Estimated: >60 mL/min (ref >60)
Glucose, Bld: 139 mg/dL — ABNORMAL HIGH (ref 70–99)
Potassium: 4.7 mmol/L (ref 3.5–5.1)
Sodium: 141 mmol/L (ref 135–145)
Total Bilirubin: 0.6 mg/dL (ref 0.3–1.2)
Total Protein: 6.3 g/dL — ABNORMAL LOW (ref 6.5–8.1)

## 2020-09-02 LAB — CBC WITH DIFFERENTIAL/PLATELET
Band Neutrophils: 3 %
Basophils Absolute: 0 10*3/uL (ref 0.0–0.1)
Basophils Relative: 0 %
Eosinophils Absolute: 0 10*3/uL (ref 0.0–0.5)
Eosinophils Relative: 0 %
HCT: 36.7 % — ABNORMAL LOW (ref 39.0–52.0)
Hemoglobin: 12 g/dL — ABNORMAL LOW (ref 13.0–17.0)
Lymphocytes Relative: 12 %
Lymphs Abs: 2.2 10*3/uL (ref 0.7–4.0)
MCH: 34.7 pg — ABNORMAL HIGH (ref 26.0–34.0)
MCHC: 32.7 g/dL (ref 30.0–36.0)
MCV: 106.1 fL — ABNORMAL HIGH (ref 80.0–100.0)
Metamyelocytes Relative: 3 %
Monocytes Absolute: 2.8 10*3/uL — ABNORMAL HIGH (ref 0.1–1.0)
Monocytes Relative: 15 %
Myelocytes: 3 %
Neutro Abs: 12.3 10*3/uL — ABNORMAL HIGH (ref 1.7–7.7)
Neutrophils Relative %: 63 %
Platelets: 150 10*3/uL (ref 150–400)
Promyelocytes Relative: 1 %
RBC: 3.46 MIL/uL — ABNORMAL LOW (ref 4.22–5.81)
RDW: 15 % (ref 11.5–15.5)
WBC: 18.7 10*3/uL — ABNORMAL HIGH (ref 4.0–10.5)
nRBC: 1.7 % — ABNORMAL HIGH (ref 0.0–0.2)

## 2020-09-02 LAB — PSA: Prostatic Specific Antigen: 26.13 ng/mL — ABNORMAL HIGH (ref 0.00–4.00)

## 2020-09-02 MED ORDER — DENOSUMAB 120 MG/1.7ML ~~LOC~~ SOLN
120.0000 mg | Freq: Once | SUBCUTANEOUS | Status: AC
  Administered 2020-09-02: 120 mg via SUBCUTANEOUS

## 2020-09-02 MED ORDER — DENOSUMAB 120 MG/1.7ML ~~LOC~~ SOLN
SUBCUTANEOUS | Status: AC
  Filled 2020-09-02: qty 1.7

## 2020-09-02 NOTE — Telephone Encounter (Signed)
*  STAT* If patient is at the pharmacy, call can be transferred to refill team.   1. Which medications need to be refilled? (please list name of each medication and dose if known)  plavix - has to be generic form   2. Which pharmacy/location (including street and city if local pharmacy) is [facility] [place]  3. Do they need a 30 day or 90 day supply? [place]

## 2020-09-02 NOTE — Patient Instructions (Signed)
[facility]  Discharge Instructions:   _______________________________________________________________  Thank you for choosing [facility] at [facility] to provide your oncology and hematology care.  To afford each patient quality time with our providers, please arrive at least 15 minutes before your scheduled appointment.  You need to re-schedule your appointment if you arrive 10 or more minutes late.  We strive to give you quality time with our providers, and arriving late affects you and other patients whose appointments are after yours.  Also, if you no show three or more times for appointments you may be dismissed from the clinic.  Again, thank you for choosing [facility]. Our hope is that these requests will allow you access to exceptional care and in a timely manner. _______________________________________________________________  If you have questions after your visit, please contact our office at [phone] between the hours of 8:30 a.m. and 5:00 p.m. Voicemails left after 4:30 p.m. will not be returned until the following business day. _______________________________________________________________  For prescription refill requests, have your pharmacy contact our office. _______________________________________________________________  Recommendations made by the consultant and any test results will be sent to your referring physician. _______________________________________________________________ 

## 2020-09-02 NOTE — Telephone Encounter (Signed)
Contacted [facility] to see what problem there is with the plavix rx, spoke w/[facility] pharmacist, who says insurance wouldn't cover #98 but did cover #90. Rx is available at this time.  Patient informed.

## 2020-09-02 NOTE — Telephone Encounter (Signed)
New message     Patient stopped by the office, his insurance contacted him and said that there was a problem with the Plavix prescription? Can you please check into this he has about 2 weeks left of the medication he is on before he switches over

## 2020-09-02 NOTE — Progress Notes (Signed)
Calcium 9.1 today for Xgeva shot.  Patient taking calcium as directed.  Denied tooth, jaw, and leg pain.  No recent or upcoming dental visits.  Labs reviewed.  Patient tolerated injection with no complaints voiced.  See MAR for details.  Patient stable during and after injection.  Site clean and dry with no bruising or swelling noted.  Band aid applied.  Vss with discharge and left ambulatory with no s/s of distress noted.

## 2020-09-03 ENCOUNTER — Ambulatory Visit (HOSPITAL_COMMUNITY): Payer: Medicare Other

## 2020-09-03 ENCOUNTER — Encounter (HOSPITAL_COMMUNITY): Payer: Medicare Other

## 2020-09-08 ENCOUNTER — Encounter (HOSPITAL_COMMUNITY)
Admission: RE | Admit: 2020-09-08 | Discharge: 2020-09-08 | Disposition: A | Discharge status: Home / Self Care | Payer: Medicare Other | Source: Ambulatory Visit | Attending: Cardiology | Admitting: Cardiology

## 2020-09-08 ENCOUNTER — Telehealth: Payer: Self-pay | Admitting: *Deleted

## 2020-09-08 ENCOUNTER — Telehealth: Payer: Self-pay | Admitting: Cardiology

## 2020-09-08 ENCOUNTER — Encounter (HOSPITAL_BASED_OUTPATIENT_CLINIC_OR_DEPARTMENT_OTHER)
Admission: RE | Admit: 2020-09-08 | Discharge: 2020-09-08 | Disposition: A | Discharge status: Home / Self Care | Payer: Medicare Other | Source: Ambulatory Visit | Attending: Cardiology | Admitting: Cardiology

## 2020-09-08 DIAGNOSIS — I25119 Atherosclerotic heart disease of native coronary artery with unspecified angina pectoris: Secondary | ICD-10-CM | POA: Diagnosis present

## 2020-09-08 DIAGNOSIS — Z01810 Encounter for preprocedural cardiovascular examination: Secondary | ICD-10-CM | POA: Insufficient documentation

## 2020-09-08 LAB — NM MYOCAR MULTI W/SPECT W/WALL MOTION / EF
LV dias vol: 92 mL (ref 62–150)
LV sys vol: 56 mL
Peak HR: 102 {beats}/min
RATE: 0.44
Rest HR: 76 {beats}/min
SDS: 3
SRS: 3
SSS: 6
TID: 1.09

## 2020-09-08 MED ORDER — SODIUM CHLORIDE FLUSH 0.9 % IV SOLN
INTRAVENOUS | Status: AC
  Administered 2020-09-08: 10 mL via INTRAVENOUS
  Filled 2020-09-08: qty 10

## 2020-09-08 MED ORDER — TECHNETIUM TC 99M TETROFOSMIN IV KIT
30.0000 | PACK | Freq: Once | INTRAVENOUS | Status: AC | PRN
  Administered 2020-09-08: 30 via INTRAVENOUS

## 2020-09-08 MED ORDER — TECHNETIUM TC 99M TETROFOSMIN IV KIT
10.0000 | PACK | Freq: Once | INTRAVENOUS | Status: AC | PRN
  Administered 2020-09-08: 10.6 via INTRAVENOUS

## 2020-09-08 MED ORDER — REGADENOSON 0.4 MG/5ML IV SOLN
INTRAVENOUS | Status: AC
  Administered 2020-09-08: 0.4 mg via INTRAVENOUS
  Filled 2020-09-08: qty 5

## 2020-09-08 NOTE — Telephone Encounter (Signed)
-----   Message from Satira Sark, MD sent at 09/08/2020  3:46 PM EDT ----- Results reviewed.  Evidence of inferior wall scar with mild peri-infarct ischemia.  This is a relatively low risk finding in the absence of progressive angina on medical therapy.  Would suggest a follow-up echocardiogram to reevaluate LVEF given Myoview calculation of 39% which is lower than prior echocardiogram.  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

## 2020-09-08 NOTE — Telephone Encounter (Signed)
Patient informed and verbalized understanding of plan. Copy sent to PCP 

## 2020-09-08 NOTE — Telephone Encounter (Signed)
Pre-cert Verification for the following procedure    ECHO   DATE:   09/10/2020  LOCATION: [facility]

## 2020-09-10 ENCOUNTER — Telehealth: Payer: Self-pay | Admitting: Cardiology

## 2020-09-10 ENCOUNTER — Ambulatory Visit (HOSPITAL_COMMUNITY)
Admission: RE | Admit: 2020-09-10 | Discharge: 2020-09-10 | Disposition: A | Discharge status: Home / Self Care | Payer: Medicare Other | Source: Ambulatory Visit | Attending: Cardiology | Admitting: Cardiology

## 2020-09-10 ENCOUNTER — Telehealth: Payer: Self-pay | Admitting: *Deleted

## 2020-09-10 ENCOUNTER — Other Ambulatory Visit: Payer: Self-pay

## 2020-09-10 DIAGNOSIS — I25119 Atherosclerotic heart disease of native coronary artery with unspecified angina pectoris: Secondary | ICD-10-CM | POA: Diagnosis not present

## 2020-09-10 LAB — ECHOCARDIOGRAM COMPLETE
Area-P 1/2: 3.7 cm2
P 1/2 time: 306 msec
S' Lateral: 3.6 cm

## 2020-09-10 NOTE — Progress Notes (Signed)
*  PRELIMINARY RESULTS* Echocardiogram 2D Echocardiogram has been performed.  Hunter Jones 09/10/2020, 11:24 AM

## 2020-09-10 NOTE — Telephone Encounter (Signed)
I am going to send this to the [facility] staff, he typically follows there.  I am sure that his concerns stem from the recent Myoview resulted yesterday.  Let's see what the echocardiogram shows as far as LVEF is concerned.  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  Certainly exercise is important as is regular medical therapy and follow-up.  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

## 2020-09-10 NOTE — Telephone Encounter (Signed)
-----   Message from Satira Sark, MD sent at 09/10/2020 11:39 AM EDT ----- Results reviewed.  Please let Hunter Jones know that cardiac function is better than that calculated on the recent Myoview, LVEF low normal at 50 to 55%.  As mentioned in the Myoview report, as long as he has been symptomatically stable without progressive angina on medical therapy, she he should be able to be cleared for DOT evaluation at this time.

## 2020-09-10 NOTE — Telephone Encounter (Signed)
Patient informed and verbalized understanding of plan. 

## 2020-09-10 NOTE — Telephone Encounter (Signed)
Patient informed. Copy sent to PCP °

## 2020-09-10 NOTE — Telephone Encounter (Signed)
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  Patient stopped in office and said that he needs to know what his options are with his health are things going to improve, are there exercises or things he can do to improve his heart strength? Or does he need to start exploring other options for employment? He would like a call this afternoon if possible from Dr Domenic Polite or APP

## 2020-09-15 ENCOUNTER — Inpatient Hospital Stay (HOSPITAL_COMMUNITY): Payer: Medicare Other

## 2020-09-15 ENCOUNTER — Other Ambulatory Visit: Payer: Self-pay

## 2020-09-15 ENCOUNTER — Inpatient Hospital Stay (HOSPITAL_COMMUNITY): Payer: Medicare Other | Attending: Hematology

## 2020-09-15 ENCOUNTER — Encounter (HOSPITAL_COMMUNITY): Payer: Self-pay

## 2020-09-15 ENCOUNTER — Inpatient Hospital Stay (HOSPITAL_BASED_OUTPATIENT_CLINIC_OR_DEPARTMENT_OTHER): Payer: Medicare Other | Admitting: Hematology

## 2020-09-15 VITALS — BP 120/70 | HR 77 | Temp 96.9°F | Resp 20 | Wt 259.3 lb

## 2020-09-15 DIAGNOSIS — C61 Malignant neoplasm of prostate: Secondary | ICD-10-CM | POA: Insufficient documentation

## 2020-09-15 DIAGNOSIS — Z5189 Encounter for other specified aftercare: Secondary | ICD-10-CM | POA: Insufficient documentation

## 2020-09-15 DIAGNOSIS — C7951 Secondary malignant neoplasm of bone: Secondary | ICD-10-CM | POA: Diagnosis present

## 2020-09-15 DIAGNOSIS — Z95828 Presence of other vascular implants and grafts: Secondary | ICD-10-CM

## 2020-09-15 DIAGNOSIS — Z5111 Encounter for antineoplastic chemotherapy: Secondary | ICD-10-CM | POA: Insufficient documentation

## 2020-09-15 DIAGNOSIS — Z79899 Other long term (current) drug therapy: Secondary | ICD-10-CM | POA: Insufficient documentation

## 2020-09-15 LAB — COMPREHENSIVE METABOLIC PANEL
ALT: 23 U/L (ref 0–44)
AST: 26 U/L (ref 15–41)
Albumin: 3.4 g/dL — ABNORMAL LOW (ref 3.5–5.0)
Alkaline Phosphatase: 75 U/L (ref 38–126)
Anion gap: 7 (ref 5–15)
BUN: 30 mg/dL — ABNORMAL HIGH (ref 8–23)
CO2: 22 mmol/L (ref 22–32)
Calcium: 9.1 mg/dL (ref 8.9–10.3)
Chloride: 110 mmol/L (ref 98–111)
Creatinine, Ser: 0.97 mg/dL (ref 0.61–1.24)
GFR, Estimated: >60 mL/min (ref >60)
Glucose, Bld: 113 mg/dL — ABNORMAL HIGH (ref 70–99)
Potassium: 4.2 mmol/L (ref 3.5–5.1)
Sodium: 139 mmol/L (ref 135–145)
Total Bilirubin: 0.7 mg/dL (ref 0.3–1.2)
Total Protein: 5.9 g/dL — ABNORMAL LOW (ref 6.5–8.1)

## 2020-09-15 LAB — CBC WITH DIFFERENTIAL/PLATELET
Abs Immature Granulocytes: 0.04 10*3/uL (ref 0.00–0.07)
Basophils Absolute: 0 10*3/uL (ref 0.0–0.1)
Basophils Relative: 1 %
Eosinophils Absolute: 0.1 10*3/uL (ref 0.0–0.5)
Eosinophils Relative: 1 %
HCT: 34.1 % — ABNORMAL LOW (ref 39.0–52.0)
Hemoglobin: 11.3 g/dL — ABNORMAL LOW (ref 13.0–17.0)
Immature Granulocytes: 1 %
Lymphocytes Relative: 10 %
Lymphs Abs: 0.8 10*3/uL (ref 0.7–4.0)
MCH: 34.8 pg — ABNORMAL HIGH (ref 26.0–34.0)
MCHC: 33.1 g/dL (ref 30.0–36.0)
MCV: 104.9 fL — ABNORMAL HIGH (ref 80.0–100.0)
Monocytes Absolute: 0.4 10*3/uL (ref 0.1–1.0)
Monocytes Relative: 6 %
Neutro Abs: 6.5 10*3/uL (ref 1.7–7.7)
Neutrophils Relative %: 81 %
Platelets: 160 10*3/uL (ref 150–400)
RBC: 3.25 MIL/uL — ABNORMAL LOW (ref 4.22–5.81)
RDW: 14.6 % (ref 11.5–15.5)
WBC: 7.9 10*3/uL (ref 4.0–10.5)
nRBC: 0 % (ref 0.0–0.2)

## 2020-09-15 LAB — PSA: Prostatic Specific Antigen: 32.93 ng/mL — ABNORMAL HIGH (ref 0.00–4.00)

## 2020-09-15 LAB — MAGNESIUM: Magnesium: 1.8 mg/dL (ref 1.7–2.4)

## 2020-09-15 MED ORDER — ONDANSETRON HCL 4 MG/2ML IJ SOLN: 8.0000 mg | Freq: Once | INTRAMUSCULAR | Status: DC

## 2020-09-15 MED ORDER — SODIUM CHLORIDE 0.9 % IV SOLN: 16.0000 mg | Freq: Once | INTRAVENOUS | Status: DC

## 2020-09-15 MED ORDER — SODIUM CHLORIDE 0.9% FLUSH
10.0000 mL | INTRAVENOUS | Status: DC | PRN
  Administered 2020-09-15: 10 mL

## 2020-09-15 MED ORDER — SODIUM CHLORIDE 0.9 % IV SOLN
75.0000 mg/m2 | Freq: Once | INTRAVENOUS | Status: AC
  Administered 2020-09-15: 180 mg via INTRAVENOUS
  Filled 2020-09-15: qty 18

## 2020-09-15 MED ORDER — ONDANSETRON 8 MG/50ML IVPB (CHCC): 8.0000 mg | Freq: Once | INTRAVENOUS | Status: DC

## 2020-09-15 MED ORDER — SODIUM CHLORIDE 0.9 % IV SOLN
10.0000 mg | Freq: Once | INTRAVENOUS | Status: AC
  Administered 2020-09-15: 10 mg via INTRAVENOUS
  Filled 2020-09-15: qty 10

## 2020-09-15 MED ORDER — SODIUM CHLORIDE 0.9 % IV SOLN
Freq: Once | INTRAVENOUS | Status: AC
Start: 2020-09-15 — End: 2020-09-15

## 2020-09-15 MED ORDER — SODIUM CHLORIDE 0.9 % IV SOLN
Freq: Once | INTRAVENOUS | Status: AC
  Filled 2020-09-15: qty 4

## 2020-09-15 MED ORDER — HEPARIN SOD (PORK) LOCK FLUSH 100 UNIT/ML IV SOLN
500.0000 [IU] | Freq: Once | INTRAVENOUS | Status: AC | PRN
  Administered 2020-09-15: 500 [IU]

## 2020-09-15 NOTE — Progress Notes (Signed)
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 Radical resection of prostate on 06/27/2014. 2. Zytiga from 05/19/2018 to 11/09/2019. 3. Xtandi from 11/21/2019 to 12/05/2019, stopped due to mood changes.  NGS Results: Guardant AR amplification, MSI normal, TMB 16 Muts/Mb  CURRENT THERAPY: Docetaxel every 3 weeks; Xgeva monthly; Lupron every 3 months  BRIEF ONCOLOGIC HISTORY:  Oncology History  Prostate cancer ([place])  02/06/2014 Procedure   Prostate biopsy by Dr. Clyde Lundborg   02/11/2014 Pathology Results   Prostatic adenocarcinoma identified in 5 of 6 prostate specimens with Gleason pattern showing primary pattern-grade 4, secondary pattern grade 3.  Total Gleason score equals 7.  Proportion of prostatic tissue involved by tumor: 70%.   05/03/2014 Imaging   Bone scan- Negative    06/27/2014 Procedure   Radical resection of prostate by Dr. Raynelle Bring   07/29/2014 Pathology Results   1. Prostate, radical resection - PROSTATIC ADENOCARCINOMA, GLEASON SCORE 4 + 3 = 7. - RIGHT AND LEFT PROSTATE INVOLVED. - RIGHT AND LEFT SEMINAL VESICLES INVOLVED BY TUMOR. - EXTRACAPSULAR EXTENSION BY TUMOR. - TUMOR EXTENDS INTO BLADDER NECK TISSUE. - MARGINS NOT INVOLVED. 2. Lymph nodes, regional resection, left pelvic - THREE BENIGN LYMPH NODES (0/3). 3. Lymph nodes, regional resection, right pelvic - FOUR BENIGN LYMPH NODES (0/4).   11/16/2016 Imaging   Bone scan- New areas of increased uptake superolateral to the left orbit (faint), at S1, and in the posterolateral aspect of the left sixth rib.    11/25/2016 Imaging   CT CAP- 1. Status post radical prostatectomy. No findings to suggest local recurrence of disease or definite extraskeletal metastatic disease in the  chest, abdomen or pelvis. However, there are several osseous lesions, as above, concerning for metastatic disease to the bones. 2. Hepatic steatosis. 3. Aortic atherosclerosis, in addition to 2 vessel coronary artery disease. Please note that although the presence of coronary artery calcium documents the presence of coronary artery disease, the severity of this disease and any potential stenosis cannot be assessed on this non-gated CT examination. Assessment for potential risk factor modification, dietary therapy or pharmacologic therapy may be warranted, if clinically indicated. 4. Additional incidental findings, as above.    Genetic Testing   Guardant 360 Results:         06/23/2020 -  Chemotherapy    Patient is on Treatment Plan: PROSTATE DOCETAXEL + PREDNISONE Q21D        CANCER STAGING: Cancer Staging Prostate cancer [facility]) Staging form: Prostate, AJCC 7th Edition - Pathologic stage from 07/29/2014: Stage III (T3b, N0, cM0, Gleason 7) - Signed by Baird Cancer, PA-C on 11/09/2016 - Clinical: Stage IV (T3, N0, M1, PSA: Less than 10, Gleason 7) - Signed by [name], MD on 12/21/2016 - Pathologic: No stage assigned - Unsigned   INTERVAL HISTORY:  [name], a 65 y.o. male, returns for routine follow-up and consideration for next cycle of chemotherapy. Hunter Jones was last seen on 08/25/2020.  Due for cycle #5 of docetaxel today.   Overall, he tells me he has been feeling good. He tolerated the previous treatment well and denies having jaw pain, though he notes that his diarrhea improves after about 2 weeks after chemo. He continues having SOB if he works for more  than 30 minutes. He continues having numbness and sensation of swelling in the soles of his feet but denies numbness in fingers or N/V/C. He continues wearing stockings on his legs. He will start taking Plavix in the middle of the week.  He continues driving locally as much as he can.  Overall, he feels  ready for next cycle of chemo today.    REVIEW OF SYSTEMS:  Review of Systems  Constitutional: Positive for fatigue (85%). Negative for appetite change.  HENT:   Positive for nosebleeds (occasional).   Respiratory: Positive for shortness of breath (w/ prolonged exertion).   Cardiovascular: Positive for leg swelling (wearing stockings).  Gastrointestinal: Positive for diarrhea (x2 weeks after chemo). Negative for constipation, nausea and vomiting.  Neurological: Positive for numbness (soles of feet).  All other systems reviewed and are negative.   PAST MEDICAL/SURGICAL HISTORY:  Past Medical History:  Diagnosis Date  . Acute ST elevation myocardial infarction (STEMI) of inferior wall ([place]) 11/2019  . Asthma   . COPD (chronic obstructive pulmonary disease) ([place])   . Coronary artery disease    DES to mid LAD August 2014 ([facility]); overlapping DESx2 prox-mid RCA 11/2019  . Essential hypertension   . Hyperthyroidism   . NSTEMI (non-ST elevated myocardial infarction) ([place]) 2014  . Port-A-Cath in place 06/17/2020  . Prostate cancer ([place])   . Type 2 diabetes mellitus ([place])    Past Surgical History:  Procedure Laterality Date  . APPENDECTOMY    . CORONARY/GRAFT ACUTE MI REVASCULARIZATION N/A 12/10/2019   Procedure: Coronary/Graft Acute MI Revascularization;  Surgeon: Nelva Bush, MD;  Location: [facility];  Service: Cardiovascular;  Laterality: N/A;  . IR IMAGING GUIDED PORT INSERTION  06/19/2020  . LEFT HEART CATH AND CORONARY ANGIOGRAPHY N/A 12/10/2019   Procedure: LEFT HEART CATH AND CORONARY ANGIOGRAPHY;  Surgeon: Nelva Bush, MD;  Location: [facility];  Service: Cardiovascular;  Laterality: N/A;  . LYMPHADENECTOMY Bilateral 06/27/2014   Procedure: LYMPHADENECTOMY;  Surgeon: Raynelle Bring, MD;  Location: [facility];  Service: Urology;  Laterality: Bilateral;  . ROBOT ASSISTED LAPAROSCOPIC RADICAL PROSTATECTOMY N/A 06/27/2014   Procedure: ROBOTIC ASSISTED LAPAROSCOPIC  RADICAL PROSTATECTOMY LEVEL 3;  Surgeon: Raynelle Bring, MD;  Location: [facility];  Service: Urology;  Laterality: N/A;  . TONSILLECTOMY      SOCIAL HISTORY:  Social History   Socioeconomic History  . Marital status: Married    Spouse name: Not on file  . Number of children: Not on file  . Years of education: Not on file  . Highest education level: Not on file  Occupational History  . Not on file  Tobacco Use  . Smoking status: Former Smoker    Years: 40.00    Types: Cigarettes    Quit date: 06/21/2012    Years since quitting: 8.2  . Smokeless tobacco: Never Used  Vaping Use  . Vaping Use: Never used  Substance and Sexual Activity  . Alcohol use: Yes    Comment: occasional  . Drug use: No  . Sexual activity: Not on file  Other Topics Concern  . Not on file  Social History Narrative  . Not on file   Social Determinants of Health   Financial Resource Strain: Low Risk   . Difficulty of Paying Living Expenses: Not hard at all  Food Insecurity: No Food Insecurity  . Worried About Charity fundraiser in the Last Year: Never true  . Ran Out of Food in the Last Year: Never true  Transportation Needs: No Transportation Needs  . Lack of Transportation (Medical): No  . Lack of Transportation (Non-Medical): No  Physical Activity: Inactive  . Days of Exercise per Week: 0 days  . Minutes of Exercise per Session: 0 min  Stress: No Stress Concern Present  . Feeling of Stress : Not at all  Social Connections: Moderately Integrated  . Frequency of Communication with Friends and Family: More than three times a week  . Frequency of Social Gatherings with Friends and Family: Never  . Attends Religious Services: Never  . Active Member of Clubs or Organizations: Yes  . Attends Archivist Meetings: Never  . Marital Status: Married  Human resources officer Violence: Not At Risk  . Fear of Current or Ex-Partner: No  . Emotionally Abused: No  . Physically Abused: No  . Sexually Abused:  No    FAMILY HISTORY:  Family History  Problem Relation Age of Onset  . Dementia Mother   . Thyroid disease Mother   . Clotting disorder Mother   . Kidney Stones Father   . Stroke Brother   . Alzheimer's disease Maternal Uncle   . Tuberculosis Paternal Grandmother   . Heart attack Maternal Uncle        x4  . Heart attack Cousin        in his 57s    CURRENT MEDICATIONS:  Current Outpatient Medications  Medication Sig Dispense Refill  . albuterol (PROVENTIL HFA;VENTOLIN HFA) 108 (90 BASE) MCG/ACT inhaler Inhale 1-2 puffs into the lungs every 6 (six) hours as needed for wheezing or shortness of breath.    . Ascorbic Acid (VITAMIN C PO) Take 1 tablet by mouth daily.    [name] Kitchen aspirin EC 81 MG tablet Take 81 mg by mouth 3 (three) times a week.    [name] Kitchen atorvastatin (LIPITOR) 40 MG tablet TAKE 1 TABLET BY MOUTH AT BEDTIME. (Patient taking differently: Take 40 mg by mouth at bedtime.) 30 tablet 6  . Calcium Carb-Cholecalciferol (HM CALCIUM-VITAMIN D) 600-800 MG-UNIT TABS Take 1 tablet by mouth 2 (two) times daily.    . Coenzyme Q10 (COQ10) 200 MG CAPS Take 200 mg by mouth daily.    [name] Kitchen denosumab (XGEVA) 120 MG/1.7ML SOLN injection Inject 120 mg into the skin every 30 (thirty) days.    . DOCETAXEL IV Inject into the vein every 21 ( twenty-one) days.    . fluticasone-salmeterol (ADVAIR HFA) 115-21 MCG/ACT inhaler Inhale 2 puffs into the lungs 2 (two) times daily.     [name] Kitchen ibuprofen (ADVIL) 200 MG tablet Take 200 mg by mouth every 6 (six) hours as needed for headache or mild pain.    . Ibuprofen-diphenhydrAMINE HCl (IBUPROFEN PM) 200-25 MG CAPS Take 1 tablet by mouth at bedtime as needed (sleep).    [name] Kitchen KRILL OIL PO Take 1 capsule by mouth daily.    [name] Kitchen Leuprolide Acetate, 3 Month, (LUPRON DEPOT, 15-MONTH, IM) Inject 1 Dose into the muscle every 3 (three) months.    . lidocaine-prilocaine (EMLA) cream Apply a small amount to port a cath site and cover with plastic wrap 1 hour prior to chemotherapy appointments  30 g 3  . Lutein 20 MG CAPS Take 20 mg by mouth daily.    . methimazole (TAPAZOLE) 10 MG tablet Take 10 mg by mouth every other day.     . metoprolol succinate (TOPROL-XL) 25 MG 24 hr tablet TAKE 1 TABLET BY MOUTH EVERY DAY (Patient taking differently: Take 25 mg by mouth daily.) 90 tablet 1  .  Multiple Vitamin (MULTIVITAMIN) tablet Take 1 tablet by mouth daily.    . nitroGLYCERIN (NITROSTAT) 0.4 MG SL tablet Place 1 tablet (0.4 mg total) under the tongue every 5 (five) minutes as needed. 25 tablet 12  . Potassium 99 MG TABS Take 99 mg by mouth daily.    . predniSONE (DELTASONE) 5 MG tablet Take 1 tablet (5 mg total) by mouth daily with breakfast. 60 tablet 11  . prochlorperazine (COMPAZINE) 10 MG tablet Take 1 tablet (10 mg total) by mouth every 6 (six) hours as needed (Nausea or vomiting). 30 tablet 1  . Trolamine Salicylate (ASPERCREME EX) Apply 1 application topically daily as needed (pain).     No current facility-administered medications for this visit.    ALLERGIES:  Allergies  Allergen Reactions  . Xtandi [Enzalutamide] Other (See Comments)    "Severe personality change" and blood clots  . Penicillins Hives    PHYSICAL EXAM:  Performance status (ECOG): 1 - Symptomatic but completely ambulatory  Vitals:   09/15/20 0856  BP: 120/70  Pulse: 77  Resp: 20  Temp: (!) 96.9 F (36.1 C)  SpO2: 96%   Wt Readings from Last 3 Encounters:  09/15/20 259 lb 4.2 oz (117.6 kg)  09/02/20 253 lb 12.8 oz (115.1 kg)  08/25/20 255 lb 3.2 oz (115.8 kg)   Physical Exam Vitals reviewed.  Constitutional:      Appearance: Normal appearance. He is obese.  Cardiovascular:     Rate and Rhythm: Normal rate and regular rhythm.     Pulses: Normal pulses.     Heart sounds: Normal heart sounds.  Pulmonary:     Effort: Pulmonary effort is normal.     Breath sounds: Normal breath sounds.  Chest:     Comments: Port-a-Cath in R chest Musculoskeletal:     Right lower leg: Edema (1+) present.      Left lower leg: Edema (1+) present.  Neurological:     General: No focal deficit present.     Mental Status: He is alert and oriented to person, place, and time.  Psychiatric:        Mood and Affect: Mood normal.        Behavior: Behavior normal.     LABORATORY DATA:  I have reviewed the labs as listed.  CBC Latest Ref Rng & Units 09/15/2020 09/02/2020 08/25/2020  WBC 4.0 - 10.5 K/uL 7.9 18.7(H) 8.8  Hemoglobin 13.0 - 17.0 g/dL 11.3(L) 12.0(L) 11.7(L)  Hematocrit 39.0 - 52.0 % 34.1(L) 36.7(L) 35.8(L)  Platelets 150 - 400 K/uL 160 150 178   CMP Latest Ref Rng & Units 09/15/2020 09/02/2020 08/25/2020  Glucose 70 - 99 mg/dL 113(H) 139(H) 116(H)  BUN 8 - 23 mg/dL 30(H) 26(H) 26(H)  Creatinine 0.61 - 1.24 mg/dL 0.97 1.19 1.03  Sodium 135 - 145 mmol/L 139 141 139  Potassium 3.5 - 5.1 mmol/L 4.2 4.7 4.3  Chloride 98 - 111 mmol/L 110 111 109  CO2 22 - 32 mmol/L $RemoveB'22 22 23  'sFWHPAQp$ Calcium 8.9 - 10.3 mg/dL 9.1 9.1 8.8(L)  Total Protein 6.5 - 8.1 g/dL 5.9(L) 6.3(L) 6.1(L)  Total Bilirubin 0.3 - 1.2 mg/dL 0.7 0.6 0.5  Alkaline Phos 38 - 126 U/L 75 107 77  AST 15 - 41 U/L $Remo'26 27 26  'TBtjq$ ALT 0 - 44 U/L $Remo'23 23 26   'tWeiC$ PSA 26.13 (H) 09/02/2020  PSA 29.53 (H) 08/25/2020  PSA 30.86 (H) 08/04/2020    DIAGNOSTIC IMAGING:  I have independently reviewed the scans and discussed  with the patient. NM Bone Scan Whole Body  Result Date: 08/20/2020 CLINICAL DATA:  Prostate cancer, surveillance. EXAM: NUCLEAR MEDICINE WHOLE BODY BONE SCAN TECHNIQUE: Whole body anterior and posterior images were obtained approximately 3 hours after intravenous injection of radiopharmaceutical. RADIOPHARMACEUTICALS:  20.3 mCi Technetium-71m MDP IV COMPARISON:  CT 06/02/2020.  Bone scan 06/02/2020. FINDINGS: Standard whole-body bone scan obtained. Bilateral renal function and excretion present. Persistent unchanged increased activity noted in the sacrum. Other bony sclerotic lesions previously identified by CT do not demonstrate definite increased  radiopharmaceutical uptake. Bone scan appears stable from prior exam. IMPRESSION: Persistent unchanged increased activity noted the sacrum. Other bony sclerotic lesions previously identified by CT do not demonstrate definite increased radiopharmaceutical uptake. Bone scan appears stable from prior exam. Electronically Signed   By: Marcello Moores  Register   On: 08/20/2020 05:40   NM Myocar Multi W/Spect Tamela Oddi Motion / EF  Result Date: 09/08/2020  No diagnostic ST segment changes to indicate ischemia.  Moderate intensity, apical to basal inferolateral defect that is partially reversible suggesting scar with mild peri-infarct ischemia. There is also radiotracer uptake within the gut adjacent to the inferior wall on rest imaging which could be causing artifactual reversibility in this area.  This is an intermediate risk study, based on calculated LVEF. Suggest echocardiogram to clarify if not performed recently.  Nuclear stress EF: 39%.    ECHOCARDIOGRAM COMPLETE  Result Date: 09/10/2020    ECHOCARDIOGRAM REPORT   Patient Name:   Hunter Jones Date of Exam: 09/10/2020 Medical Rec #:  [id number]     Height:       70.5 in Accession #:    [id number]    Weight:       253.8 lb Date of Birth:  [birth year]     BSA:          2.322 m Patient Age:    87 years      BP:           120/70 mmHg Patient Gender: M             HR:           82 bpm. Exam Location:  [facility] Procedure: 2D Echo, Cardiac Doppler and Color Doppler Indications:    CAD Native Vessel I25.10  History:        Patient has prior history of Echocardiogram examinations, most                 recent 12/10/2019. CAD, COPD; Risk Factors:Hypertension and                 Diabetes. ST elevation (STEM) and non-ST elevation (NSTEMI)                 myocardial infarction, Cancer.  Sonographer:    Alvino Chapel RCS Referring Phys: [place]  1. Left ventricular ejection fraction, by estimation, is 50 to 55%. The left ventricle has low normal  function. The left ventricle demonstrates regional wall motion abnormalities (see scoring diagram/findings for description). The left ventricular internal cavity size was mildly dilated. There is mild left ventricular hypertrophy. Left ventricular diastolic parameters were normal.  2. Right ventricular systolic function is normal. The right ventricular size is normal. Tricuspid regurgitation signal is inadequate for assessing PA pressure.  3. Left atrial size was mildly dilated.  4. The mitral valve is grossly normal. Trivial mitral valve regurgitation.  5. The aortic valve is tricuspid. Aortic valve regurgitation is mild. Mild aortic  valve sclerosis is present, with no evidence of aortic valve stenosis.  6. The inferior vena cava is normal in size with greater than 50% respiratory variability, suggesting right atrial pressure of 3 mmHg. FINDINGS  Left Ventricle: Left ventricular ejection fraction, by estimation, is 50 to 55%. The left ventricle has low normal function. The left ventricle demonstrates regional wall motion abnormalities. The left ventricular internal cavity size was mildly dilated. There is mild left ventricular hypertrophy. Left ventricular diastolic parameters were normal.  LV Wall Scoring: The inferior wall, basal inferolateral segment, mid inferoseptal segment, and apical septal segment are hypokinetic. The entire anterior wall, antero-lateral wall, mid and distal lateral wall, anterior septum, apical inferior segment, basal inferoseptal segment, and apex are normal. Right Ventricle: The right ventricular size is normal. No increase in right ventricular wall thickness. Right ventricular systolic function is normal. Tricuspid regurgitation signal is inadequate for assessing PA pressure. Left Atrium: Left atrial size was mildly dilated. Right Atrium: Right atrial size was normal in size. Pericardium: There is no evidence of pericardial effusion. Mitral Valve: The mitral valve is grossly normal.  Mild mitral annular calcification. Trivial mitral valve regurgitation. Tricuspid Valve: The tricuspid valve is grossly normal. Tricuspid valve regurgitation is mild. Aortic Valve: The aortic valve is tricuspid. There is mild aortic valve annular calcification. Aortic valve regurgitation is mild. Aortic regurgitation PHT measures 306 msec. Mild aortic valve sclerosis is present, with no evidence of aortic valve stenosis. Pulmonic Valve: The pulmonic valve was grossly normal. Pulmonic valve regurgitation is trivial. Aorta: The aortic root is normal in size and structure. Venous: The inferior vena cava is normal in size with greater than 50% respiratory variability, suggesting right atrial pressure of 3 mmHg. IAS/Shunts: The interatrial septum appears to be lipomatous. No atrial level shunt detected by color flow Doppler.  LEFT VENTRICLE PLAX 2D LVIDd:         5.50 cm  Diastology LVIDs:         3.60 cm  LV e' medial:    9.90 cm/s LV PW:         1.00 cm  LV E/e' medial:  9.6 LV IVS:        1.00 cm  LV e' lateral:   11.50 cm/s LVOT diam:     1.90 cm  LV E/e' lateral: 8.3 LV SV:         64 LV SV Index:   28 LVOT Area:     2.84 cm  RIGHT VENTRICLE RV S prime:     14.00 cm/s TAPSE (M-mode): 2.2 cm LEFT ATRIUM             Index       RIGHT ATRIUM           Index LA diam:        4.40 cm 1.89 cm/m  RA Area:     21.80 cm LA Vol (A2C):   85.5 ml 36.82 ml/m RA Volume:   66.50 ml  28.64 ml/m LA Vol (A4C):   83.2 ml 35.83 ml/m LA Biplane Vol: 84.3 ml 36.31 ml/m  AORTIC VALVE LVOT Vmax:   114.00 cm/s LVOT Vmean:  79.400 cm/s LVOT VTI:    0.226 m AI PHT:      306 msec  AORTA Ao Root diam: 3.50 cm MITRAL VALVE MV Area (PHT): 3.70 cm     SHUNTS MV Decel Time: 205 msec     Systemic VTI:  0.23 m MV E velocity: 95.50 cm/s  Systemic Diam: 1.90 cm MV A velocity: 115.00 cm/s MV E/A ratio:  0.83 Rozann Lesches MD Electronically signed by Rozann Lesches MD Signature Date/Time: 09/10/2020/11:34:24 AM    Final      ASSESSMENT:  1.  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 -Abiraterone and prednisone from 06/16/2018 through 11/06/2019 with progression. -Xtandi started around 11/21/2019, discontinued on 12/05/2019 due to mood changes. -ST elevation MI involving the right coronary artery on 12/10/2019, status post cardiac catheterization showing thrombotic occlusion of the proximal RCA, successful PCI with stent placement. -[employer] testing was negativefor germline mutations. -Guardant 360 showed AR amplification. No other mutations. -Bicalutamide 50 mg dailyfrom 04/02/2020 through 05/12/2020 with progression. -Bone scan on 06/02/2020 with increased activity throughout the sacrum with no other bony abnormalities. -CT CAP from 06/02/2020 with no visceral metastatic disease. -[facility] [place] is not compatible with Brilinta. -Docetaxel started on 06/23/2020. Initial discussion was to reassess response after 6 cycles of chemotherapy. If he has excellent response, and if he is not tolerating well, we may observe at that time.   PLAN:  1. 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 -His PSA was 26 on 09/02/2020. -He reports that bottom of his feet feels puffy, early sign of neuropathy. -Reviewed his labs today which showed normal LFTs and CBC.  Electrolytes are normal. -Proceed with docetaxel 75 mg per metered square cycle 5 today.  RTC 3 weeks for follow-up.  2. Bone metastasis: -Continue monthly denosumab.  Continue calcium supplements.  3.Elevated blood sugars: -Continue Jardiance.  4. CADand stents: -He is taking aspirin and Brilinta. -He will switch to Plavix midweek this week.   Orders placed this encounter:  Orders Placed This Encounter  Procedures  . CBC with Differential/Platelet  . Comprehensive metabolic panel  . Magnesium  . PSA     Derek Jack, MD [place] [phone]   I,  Milinda Antis, am acting as a scribe for Dr. Sanda Linger.  I, Derek Jack MD, have reviewed the above documentation for accuracy and completeness, and I agree with the above.

## 2020-09-15 NOTE — Progress Notes (Signed)
Patient was assessed by Dr. Katragadda and labs have been reviewed.  Patient is okay to proceed with treatment today. Primary RN and pharmacy aware.   

## 2020-09-15 NOTE — Patient Instructions (Signed)
[facility] at [facility] Discharge Instructions  You were seen today by Dr. Katragadda. He went over your recent results. You received your treatment today. Dr. Katragadda will see you back in 3 weeks for labs and follow up.   Thank you for choosing [facility] at [facility] to provide your oncology and hematology care.  To afford each patient quality time with our provider, please arrive at least 15 minutes before your scheduled appointment time.   If you have a lab appointment with the Cancer Center please come in thru the Main Entrance and check in at the main information desk  You need to re-schedule your appointment should you arrive 10 or more minutes late.  We strive to give you quality time with our providers, and arriving late affects you and other patients whose appointments are after yours.  Also, if you no show three or more times for appointments you may be dismissed from the clinic at the providers discretion.     Again, thank you for choosing [facility].  Our hope is that these requests will decrease the amount of time that you wait before being seen by our physicians.       _____________________________________________________________  Should you have questions after your visit to [facility], please contact our office at [phone] between the hours of 8:00 a.m. and 4:30 p.m.  Voicemails left after 4:00 p.m. will not be returned until the following business day.  For prescription refill requests, have your pharmacy contact our office and allow 72 hours.    Cancer Center Support Programs:   > Cancer Support Group  2nd Tuesday of the month 1pm-2pm, Journey Room    

## 2020-09-15 NOTE — Progress Notes (Signed)
BUN of 30 noted by MD today. No change in treatment.  Treatment given per orders. Patient tolerated it well without problems. Vitals stable and discharged home from clinic ambulatory. Follow up as scheduled.

## 2020-09-15 NOTE — Progress Notes (Signed)
Patient presents today for treatment and follow up visit with Dr. Delton Coombes. Labs reviewed by MD. Labs within parameters for treatment. Vital signs within parameters for treatment.

## 2020-09-15 NOTE — Patient Instructions (Signed)
[facility] Discharge Instructions for Patients Receiving Chemotherapy  Today you received the following chemotherapy agents   To help prevent nausea and vomiting after your treatment, we encourage you to take your nausea medication   If you develop nausea and vomiting that is not controlled by your nausea medication, call the clinic.   BELOW ARE SYMPTOMS THAT SHOULD BE REPORTED IMMEDIATELY:  *FEVER GREATER THAN 100.5 F  *CHILLS WITH OR WITHOUT FEVER  NAUSEA AND VOMITING THAT IS NOT CONTROLLED WITH YOUR NAUSEA MEDICATION  *UNUSUAL SHORTNESS OF BREATH  *UNUSUAL BRUISING OR BLEEDING  TENDERNESS IN MOUTH AND THROAT WITH OR WITHOUT PRESENCE OF ULCERS  *URINARY PROBLEMS  *BOWEL PROBLEMS  UNUSUAL RASH Items with * indicate a potential emergency and should be followed up as soon as possible.  Feel free to call the clinic should you have any questions or concerns. The clinic phone number is [phone].  Please show the CHEMO ALERT CARD at check-in to the Emergency Department and triage nurse.   

## 2020-09-17 ENCOUNTER — Inpatient Hospital Stay (HOSPITAL_COMMUNITY): Payer: Medicare Other

## 2020-09-17 ENCOUNTER — Other Ambulatory Visit: Payer: Self-pay

## 2020-09-17 VITALS — BP 109/44 | HR 71 | Temp 97.0°F

## 2020-09-17 DIAGNOSIS — C61 Malignant neoplasm of prostate: Secondary | ICD-10-CM

## 2020-09-17 DIAGNOSIS — Z95828 Presence of other vascular implants and grafts: Secondary | ICD-10-CM

## 2020-09-17 DIAGNOSIS — Z5111 Encounter for antineoplastic chemotherapy: Secondary | ICD-10-CM | POA: Diagnosis not present

## 2020-09-17 MED ORDER — PEGFILGRASTIM-JMDB 6 MG/0.6ML ~~LOC~~ SOSY
6.0000 mg | PREFILLED_SYRINGE | Freq: Once | SUBCUTANEOUS | Status: AC
Start: 2020-09-17 — End: 2020-09-17
  Administered 2020-09-17: 6 mg via SUBCUTANEOUS

## 2020-09-17 NOTE — Patient Instructions (Signed)
[place] at [facility]  Discharge Instructions:  Fulphila injection.  _______________________________________________________________  Thank you for choosing [place] at [facility] to provide your oncology and hematology care.  To afford each patient quality time with our providers, please arrive at least 15 minutes before your scheduled appointment.  You need to re-schedule your appointment if you arrive 10 or more minutes late.  We strive to give you quality time with our providers, and arriving late affects you and other patients whose appointments are after yours.  Also, if you no show three or more times for appointments you may be dismissed from the clinic.  Again, thank you for choosing [place] at [place] [facility] is that these requests will allow you access to exceptional care and in a timely manner. _______________________________________________________________  If you have questions after your visit, please contact our office at [phone] between the hours of 8:30 a.m. and 5:00 p.m. Voicemails left after 4:30 p.m. will not be returned until the following business day. _______________________________________________________________  For prescription refill requests, have your pharmacy contact our office. _______________________________________________________________  Recommendations made by the consultant and any test results will be sent to your referring physician. _______________________________________________________________

## 2020-09-17 NOTE — Progress Notes (Signed)
Phillips Climes presents today for injection per MD orders. Fulphila administered SQ in right Upper Arm. Administration without incident. Patient tolerated well. Tolerated without adverse affects. Vital signs stable. No complaints at this time. Discharged from clinic ambulatory in stable condition. Alert and oriented x 3. F/U with [facility] as scheduled.

## 2020-09-29 ENCOUNTER — Ambulatory Visit: Payer: Medicare Other | Admitting: Cardiology

## 2020-10-06 ENCOUNTER — Inpatient Hospital Stay (HOSPITAL_BASED_OUTPATIENT_CLINIC_OR_DEPARTMENT_OTHER): Payer: Medicare Other | Admitting: Hematology

## 2020-10-06 ENCOUNTER — Inpatient Hospital Stay (HOSPITAL_COMMUNITY): Payer: Medicare Other

## 2020-10-06 ENCOUNTER — Other Ambulatory Visit: Payer: Self-pay

## 2020-10-06 VITALS — BP 108/55 | HR 80 | Temp 97.8°F | Resp 18 | Wt 257.9 lb

## 2020-10-06 VITALS — BP 123/70 | HR 74 | Temp 97.1°F | Resp 18

## 2020-10-06 DIAGNOSIS — C61 Malignant neoplasm of prostate: Secondary | ICD-10-CM

## 2020-10-06 DIAGNOSIS — Z5111 Encounter for antineoplastic chemotherapy: Secondary | ICD-10-CM | POA: Diagnosis not present

## 2020-10-06 DIAGNOSIS — Z95828 Presence of other vascular implants and grafts: Secondary | ICD-10-CM

## 2020-10-06 DIAGNOSIS — C7951 Secondary malignant neoplasm of bone: Secondary | ICD-10-CM | POA: Diagnosis not present

## 2020-10-06 LAB — URINALYSIS, ROUTINE W REFLEX MICROSCOPIC
Bilirubin Urine: NEGATIVE
Glucose, UA: NEGATIVE mg/dL
Hgb urine dipstick: NEGATIVE
Ketones, ur: NEGATIVE mg/dL
Leukocytes,Ua: NEGATIVE
Nitrite: NEGATIVE
Protein, ur: NEGATIVE mg/dL
Specific Gravity, Urine: 1.017 (ref 1.005–1.030)
pH: 6 (ref 5.0–8.0)

## 2020-10-06 LAB — CBC WITH DIFFERENTIAL/PLATELET
Abs Immature Granulocytes: 0.03 10*3/uL (ref 0.00–0.07)
Basophils Absolute: 0.1 10*3/uL (ref 0.0–0.1)
Basophils Relative: 1 %
Eosinophils Absolute: 0.2 10*3/uL (ref 0.0–0.5)
Eosinophils Relative: 3 %
HCT: 32.8 % — ABNORMAL LOW (ref 39.0–52.0)
Hemoglobin: 10.8 g/dL — ABNORMAL LOW (ref 13.0–17.0)
Immature Granulocytes: 0 %
Lymphocytes Relative: 10 %
Lymphs Abs: 0.7 10*3/uL (ref 0.7–4.0)
MCH: 34.5 pg — ABNORMAL HIGH (ref 26.0–34.0)
MCHC: 32.9 g/dL (ref 30.0–36.0)
MCV: 104.8 fL — ABNORMAL HIGH (ref 80.0–100.0)
Monocytes Absolute: 0.4 10*3/uL (ref 0.1–1.0)
Monocytes Relative: 6 %
Neutro Abs: 6 10*3/uL (ref 1.7–7.7)
Neutrophils Relative %: 80 %
Platelets: 166 10*3/uL (ref 150–400)
RBC: 3.13 MIL/uL — ABNORMAL LOW (ref 4.22–5.81)
RDW: 14.6 % (ref 11.5–15.5)
WBC: 7.4 10*3/uL (ref 4.0–10.5)
nRBC: 0 % (ref 0.0–0.2)

## 2020-10-06 LAB — PSA: Prostatic Specific Antigen: 41.67 ng/mL — ABNORMAL HIGH (ref 0.00–4.00)

## 2020-10-06 LAB — COMPREHENSIVE METABOLIC PANEL
ALT: 23 U/L (ref 0–44)
AST: 25 U/L (ref 15–41)
Albumin: 3.3 g/dL — ABNORMAL LOW (ref 3.5–5.0)
Alkaline Phosphatase: 72 U/L (ref 38–126)
Anion gap: 6 (ref 5–15)
BUN: 30 mg/dL — ABNORMAL HIGH (ref 8–23)
CO2: 24 mmol/L (ref 22–32)
Calcium: 8.5 mg/dL — ABNORMAL LOW (ref 8.9–10.3)
Chloride: 109 mmol/L (ref 98–111)
Creatinine, Ser: 1.07 mg/dL (ref 0.61–1.24)
GFR, Estimated: >60 mL/min (ref >60)
Glucose, Bld: 134 mg/dL — ABNORMAL HIGH (ref 70–99)
Potassium: 4.5 mmol/L (ref 3.5–5.1)
Sodium: 139 mmol/L (ref 135–145)
Total Bilirubin: 0.6 mg/dL (ref 0.3–1.2)
Total Protein: 5.7 g/dL — ABNORMAL LOW (ref 6.5–8.1)

## 2020-10-06 LAB — MAGNESIUM: Magnesium: 1.7 mg/dL (ref 1.7–2.4)

## 2020-10-06 MED ORDER — DENOSUMAB 120 MG/1.7ML ~~LOC~~ SOLN
120.0000 mg | Freq: Once | SUBCUTANEOUS | Status: AC
Start: 2020-10-06 — End: 2020-10-06
  Administered 2020-10-06: 120 mg via SUBCUTANEOUS
  Filled 2020-10-06: qty 1.7

## 2020-10-06 MED ORDER — HEPARIN SOD (PORK) LOCK FLUSH 100 UNIT/ML IV SOLN
500.0000 [IU] | Freq: Once | INTRAVENOUS | Status: AC | PRN
  Administered 2020-10-06: 500 [IU]

## 2020-10-06 MED ORDER — SODIUM CHLORIDE 0.9 % IV SOLN
75.0000 mg/m2 | Freq: Once | INTRAVENOUS | Status: AC
  Administered 2020-10-06: 180 mg via INTRAVENOUS
  Filled 2020-10-06: qty 18

## 2020-10-06 MED ORDER — SODIUM CHLORIDE 0.9% FLUSH
10.0000 mL | INTRAVENOUS | Status: DC | PRN
  Administered 2020-10-06: 10 mL

## 2020-10-06 MED ORDER — SODIUM CHLORIDE 0.9 % IV SOLN
Freq: Once | INTRAVENOUS | Status: AC
Start: 2020-10-06 — End: 2020-10-06

## 2020-10-06 MED ORDER — SODIUM CHLORIDE 0.9 % IV SOLN
10.0000 mg | Freq: Once | INTRAVENOUS | Status: AC
  Administered 2020-10-06: 10 mg via INTRAVENOUS
  Filled 2020-10-06: qty 10

## 2020-10-06 MED ORDER — ONDANSETRON HCL 40 MG/20ML IJ SOLN
8.0000 mg | Freq: Once | INTRAMUSCULAR | Status: AC
  Administered 2020-10-06: 8 mg via INTRAVENOUS
  Filled 2020-10-06: qty 8

## 2020-10-06 NOTE — Patient Instructions (Addendum)
[place] at [facility] Discharge Instructions  You were seen today by Dr. Delton Coombes. He went over your recent results. You received your final treatment and Xgeva injection today. You will be scheduled to have a CT scan of your abdomen and a bone scan done before your next visit. You may proceed with getting your COVID booster vaccine. Eat protein-dense meals and drink protein shakes daily to improve your blood albumin level. Dr. Delton Coombes will see you back in 3 weeks for labs and follow up.   Thank you for choosing [place] at [facility] to provide your oncology and hematology care.  To afford each patient quality time with our provider, please arrive at least 15 minutes before your scheduled appointment time.   If you have a lab appointment with the [place] please come in thru the Main Entrance and check in at the main information desk  You need to re-schedule your appointment should you arrive 10 or more minutes late.  We strive to give you quality time with our providers, and arriving late affects you and other patients whose appointments are after yours.  Also, if you no show three or more times for appointments you may be dismissed from the clinic at the providers discretion.     Again, thank you for choosing [facility].  Our hope is that these requests will decrease the amount of time that you wait before being seen by our physicians.       _____________________________________________________________  Should you have questions after your [facility], please contact our office at [phone] between the hours of 8:00 a.m. and 4:30 p.m.  Voicemails left after 4:00 p.m. will not be returned until the following business day.  For prescription refill requests, have your pharmacy contact our office and allow 72 hours.    Cancer Center Support Programs:   > Cancer Support Group  2nd Tuesday of the  month 1pm-2pm, Journey Room

## 2020-10-06 NOTE — Patient Instructions (Signed)
[place]  Discharge Instructions: Thank you for choosing [place] to provide your oncology and hematology care.  If you have a lab appointment with the [facility], please come in thru the Main Entrance and check in at the main information desk.  Wear comfortable clothing and clothing appropriate for easy access to any Portacath or PICC line.   We strive to give you quality time with your provider. You may need to reschedule your appointment if you arrive late (15 or more minutes).  Arriving late affects you and other patients whose appointments are after yours.  Also, if you miss three or more appointments without notifying the office, you may be dismissed from the clinic at the provider's discretion.      For prescription refill requests, have your pharmacy contact our office and allow 72 hours for refills to be completed.    Today you received the following chemotherapy and/or immunotherapy agents Taxotere/Xgeva     To help prevent nausea and vomiting after your treatment, we encourage you to take your nausea medication as directed.  BELOW ARE SYMPTOMS THAT SHOULD BE REPORTED IMMEDIATELY: . *FEVER GREATER THAN 100.4 F (38 C) OR HIGHER . *CHILLS OR SWEATING . *NAUSEA AND VOMITING THAT IS NOT CONTROLLED WITH YOUR NAUSEA MEDICATION . *UNUSUAL SHORTNESS OF BREATH . *UNUSUAL BRUISING OR BLEEDING . *URINARY PROBLEMS (pain or burning when urinating, or frequent urination) . *BOWEL PROBLEMS (unusual diarrhea, constipation, pain near the anus) . TENDERNESS IN MOUTH AND THROAT WITH OR WITHOUT PRESENCE OF ULCERS (sore throat, sores in mouth, or a toothache) . UNUSUAL RASH, SWELLING OR PAIN  . UNUSUAL VAGINAL DISCHARGE OR ITCHING   Items with * indicate a potential emergency and should be followed up as soon as possible or go to the Emergency Department if any problems should occur.  Please show the CHEMOTHERAPY ALERT CARD or IMMUNOTHERAPY ALERT CARD at check-in  to the Emergency Department and triage nurse.  Should you have questions after your visit or need to cancel or reschedule your appointment, please contact [facility] [phone]  and follow the prompts.  Office hours are 8:00 a.m. to 4:30 p.m. Monday - Friday. Please note that voicemails left after 4:00 p.m. may not be returned until the following business day.  We are closed weekends and major holidays. You have access to a nurse at all times for urgent questions. Please call the main number to the clinic [phone] and follow the prompts.  For any non-urgent questions, you may also contact your provider using MyChart. We now offer e-Visits for anyone 104 and older to request care online for non-urgent symptoms. For details visit [email or web address].   Also download the MyChart app! Go to the app store, search "MyChart", open the app, select [facility] [place], and log in with your MyChart username and password.  Due to Covid, a mask is required upon entering the hospital/clinic. If you do not have a mask, one will be given to you upon arrival. For doctor visits, patients may have 1 support person aged 72 or older with them. For treatment visits, patients cannot have anyone with them due to current Covid guidelines and our immunocompromised population.

## 2020-10-06 NOTE — Progress Notes (Signed)
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 Radical resection of prostate on 06/27/2014. 2. Zytiga from 05/19/2018 to 11/09/2019. 3. Xtandi from 11/21/2019 to 12/05/2019, stopped due to mood changes.  NGS Results: Guardant AR amplification, MSI normal, TMB 16 Muts/Mb  CURRENT THERAPY: Docetaxel every 3 weeks; Xgeva monthly; Lupron every 3 months  BRIEF ONCOLOGIC HISTORY:  Oncology History  Prostate Jones ([place])  02/06/2014 Procedure   Prostate biopsy by Dr. 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  Total Gleason score equals 7.  Proportion of prostatic tissue involved by tumor: 70%.   05/03/2014 Imaging   Bone scan- Negative    06/27/2014 Procedure   Radical resection of prostate by Dr. Raynelle Jones   07/29/2014 Pathology Results   1. Prostate, radical resection - PROSTATIC ADENOCARCINOMA, GLEASON SCORE 4 + 3 = 7. - RIGHT AND LEFT PROSTATE INVOLVED. - RIGHT AND LEFT SEMINAL VESICLES INVOLVED BY TUMOR. - EXTRACAPSULAR EXTENSION BY TUMOR. - TUMOR EXTENDS INTO BLADDER NECK TISSUE. - MARGINS NOT INVOLVED. 2. Lymph nodes, regional resection, left pelvic - THREE BENIGN LYMPH NODES (0/3). 3. Lymph nodes, regional resection, right pelvic - FOUR BENIGN LYMPH NODES (0/4).   11/16/2016 Imaging   Bone scan- New areas of increased uptake superolateral to the left orbit (faint), at S1, and in the posterolateral aspect of the left sixth rib.    11/25/2016 Imaging   CT CAP- 1. Status post radical prostatectomy. No findings to suggest local recurrence of disease or definite extraskeletal metastatic disease in the  chest, abdomen or pelvis. However, there are several osseous lesions, as above, concerning for metastatic disease to the bones. 2. Hepatic steatosis. 3. Aortic atherosclerosis, in addition to 2 vessel coronary artery disease. Please note that although the presence of coronary artery calcium documents the presence of coronary artery disease, the severity of this disease and any potential stenosis cannot be assessed on this non-gated CT examination. Assessment for potential risk factor modification, dietary therapy or pharmacologic therapy may be warranted, if clinically indicated. 4. Additional incidental findings, as above.    Genetic Testing   Guardant 360 Results:         06/23/2020 -  Chemotherapy    Patient is on Treatment Plan: PROSTATE DOCETAXEL + PREDNISONE Q21D        Jones STAGING: Jones Staging Prostate Jones [facility]) Staging form: Prostate, AJCC 7th Edition - Pathologic stage from 07/29/2014: Stage III (T3b, N0, cM0, Gleason 7) - Signed by Hunter Cancer, PA-C on 11/09/2016 - Clinical: Stage IV (T3, N0, M1, PSA: Less than 10, Gleason 7) - Signed by Hunter First, MD on 12/21/2016 - Pathologic: No stage assigned - Unsigned   INTERVAL HISTORY:  Hunter Jones, a 65 y.o. male, returns for routine follow-up and consideration for final cycle of chemotherapy. Hunter Jones was last seen on 09/15/2020.  Due for cycle #6 of docetaxel today.   Overall, he tells me he has been feeling okay. He denies having any new bone pains. He is trying to exercise more to improve his heart health. He switched to Plavix on 04/15. His feet still feel swollen and he continues having  numbness in the soles of his feet, but he denies having tingling or burning pain. He wears compression socks daily due to his leg swelling. He also complains of having trouble urinating with frequent urination but with incomplete emptying and he wears a diaper at night; he denies burning with or at the end of  urination or urgency. He reports drinking a lot of water. He reports having diarrhea on days 3 and 4 after chemo and takes 1 tablet of Imodium daily.   He is inquiring about getting his COVID booster.  Overall, he feels ready for final cycle of chemo today.    REVIEW OF SYSTEMS:  Review of Systems  Constitutional: Positive for fatigue (75%). Negative for appetite change.  Cardiovascular: Positive for leg swelling.  Gastrointestinal: Positive for diarrhea (on D3 & 4 post chemo). Negative for nausea.  Genitourinary: Positive for difficulty urinating (frequent urinating with incomplete emptying). Negative for dysuria.   Musculoskeletal: Negative for arthralgias.  Neurological: Positive for numbness (soles of feet).  All other systems reviewed and are negative.   PAST MEDICAL/SURGICAL HISTORY:  Past Medical History:  Diagnosis Date  . Acute ST elevation myocardial infarction (STEMI) of inferior wall ([place]) 11/2019  . Asthma   . COPD (chronic obstructive pulmonary disease) ([place])   . Coronary artery disease    DES to mid LAD August 2014 ([facility]); overlapping DESx2 prox-mid RCA 11/2019  . Essential hypertension   . Hyperthyroidism   . NSTEMI (non-ST elevated myocardial infarction) ([place]) 2014  . Port-A-Cath in place 06/17/2020  . Prostate Jones ([place])   . Type 2 diabetes mellitus ([place])    Past Surgical History:  Procedure Laterality Date  . APPENDECTOMY    . CORONARY/GRAFT ACUTE MI REVASCULARIZATION N/A 12/10/2019   Procedure: Coronary/Graft Acute MI Revascularization;  Surgeon: Hunter Bush, MD;  Location: [facility];  Service: Cardiovascular;  Laterality: N/A;  . IR IMAGING GUIDED PORT INSERTION  06/19/2020  . LEFT HEART CATH AND CORONARY ANGIOGRAPHY N/A 12/10/2019   Procedure: LEFT HEART CATH AND CORONARY ANGIOGRAPHY;  Surgeon: Hunter Bush, MD;  Location: [facility];  Service: Cardiovascular;  Laterality: N/A;  . LYMPHADENECTOMY Bilateral 06/27/2014   Procedure:  LYMPHADENECTOMY;  Surgeon: Hunter Bring, MD;  Location: [facility];  Service: Urology;  Laterality: Bilateral;  . ROBOT ASSISTED LAPAROSCOPIC RADICAL PROSTATECTOMY N/A 06/27/2014   Procedure: ROBOTIC ASSISTED LAPAROSCOPIC RADICAL PROSTATECTOMY LEVEL 3;  Surgeon: Hunter Bring, MD;  Location: [facility];  Service: Urology;  Laterality: N/A;  . TONSILLECTOMY      SOCIAL HISTORY:  Social History   Socioeconomic History  . Marital status: Married    Spouse name: Not on file  . Number of children: Not on file  . Years of education: Not on file  . Highest education level: Not on file  Occupational History  . Not on file  Tobacco Use  . Smoking status: Former Smoker    Years: 40.00    Types: Cigarettes    Quit date: 06/21/2012    Years since quitting: 8.2  . Smokeless tobacco: Never Used  Vaping Use  . Vaping Use: Never used  Substance and Sexual Activity  . Alcohol use: Yes    Comment: occasional  . Drug use: No  . Sexual activity: Not on file  Other Topics Concern  . Not on file  Social History Narrative  . Not on file   Social Determinants of Health   Financial Resource Strain: Low Risk   . Difficulty of Paying  Living Expenses: Not hard at all  Food Insecurity: No Food Insecurity  . Worried About Charity fundraiser in the Last Year: Never true  . Ran Out of Food in the Last Year: Never true  Transportation Needs: No Transportation Needs  . Lack of Transportation (Medical): No  . Lack of Transportation (Non-Medical): No  Physical Activity: Inactive  . Days of Exercise per Week: 0 days  . Minutes of Exercise per Session: 0 min  Stress: No Stress Concern Present  . Feeling of Stress : Not at all  Social Connections: Moderately Integrated  . Frequency of Communication with Friends and Family: More than three times a week  . Frequency of Social Gatherings with Friends and Family: Never  . Attends Religious Services: Never  . Active Member of Clubs or Organizations: Yes  .  Attends Archivist Meetings: Never  . Marital Status: Married  Human resources officer Violence: Not At Risk  . Fear of Current or Ex-Partner: No  . Emotionally Abused: No  . Physically Abused: No  . Sexually Abused: No    FAMILY HISTORY:  Family History  Problem Relation Age of Onset  . Dementia Mother   . Thyroid disease Mother   . Clotting disorder Mother   . Kidney Stones Father   . Stroke Brother   . Alzheimer's disease Maternal Uncle   . Tuberculosis Paternal Grandmother   . Heart attack Maternal Uncle        x4  . Heart attack Cousin        in his 10s    CURRENT MEDICATIONS:  Current Outpatient Medications  Medication Sig Dispense Refill  . albuterol (PROVENTIL HFA;VENTOLIN HFA) 108 (90 BASE) MCG/ACT inhaler Inhale 1-2 puffs into the lungs every 6 (six) hours as needed for wheezing or shortness of breath.    . Ascorbic Acid (VITAMIN C PO) Take 1 tablet by mouth daily.    [name] Kitchen aspirin EC 81 MG tablet Take 81 mg by mouth 3 (three) times a week.    [name] Kitchen atorvastatin (LIPITOR) 40 MG tablet TAKE 1 TABLET BY MOUTH AT BEDTIME. (Patient taking differently: Take 40 mg by mouth at bedtime.) 30 tablet 6  . Calcium Carb-Cholecalciferol (HM CALCIUM-VITAMIN D) 600-800 MG-UNIT TABS Take 1 tablet by mouth 2 (two) times daily.    . Coenzyme Q10 (COQ10) 200 MG CAPS Take 200 mg by mouth daily.    [name] Kitchen denosumab (XGEVA) 120 MG/1.7ML SOLN injection Inject 120 mg into the skin every 30 (thirty) days.    . DOCETAXEL IV Inject into the vein every 21 ( twenty-one) days.    . fluticasone-salmeterol (ADVAIR HFA) 115-21 MCG/ACT inhaler Inhale 2 puffs into the lungs 2 (two) times daily.     [name] Kitchen ibuprofen (ADVIL) 200 MG tablet Take 200 mg by mouth every 6 (six) hours as needed for headache or mild pain.    . Ibuprofen-diphenhydrAMINE HCl (IBUPROFEN PM) 200-25 MG CAPS Take 1 tablet by mouth at bedtime as needed (sleep).    [name] Kitchen KRILL OIL PO Take 1 capsule by mouth daily.    [name] Kitchen Leuprolide Acetate, 3 Month,  (LUPRON DEPOT, 25-MONTH, IM) Inject 1 Dose into the muscle every 3 (three) months.    . lidocaine-prilocaine (EMLA) cream Apply a small amount to port a cath site and cover with plastic wrap 1 hour prior to chemotherapy appointments 30 g 3  . Lutein 20 MG CAPS Take 20 mg by mouth daily.    . methimazole (TAPAZOLE) 10 MG tablet Take  10 mg by mouth every other day.     . metoprolol succinate (TOPROL-XL) 25 MG 24 hr tablet TAKE 1 TABLET BY MOUTH EVERY DAY (Patient taking differently: Take 25 mg by mouth daily.) 90 tablet 1  . Multiple Vitamin (MULTIVITAMIN) tablet Take 1 tablet by mouth daily.    . nitroGLYCERIN (NITROSTAT) 0.4 MG SL tablet Place 1 tablet (0.4 mg total) under the tongue every 5 (five) minutes as needed. 25 tablet 12  . Potassium 99 MG TABS Take 99 mg by mouth daily.    . predniSONE (DELTASONE) 5 MG tablet Take 1 tablet (5 mg total) by mouth daily with breakfast. 60 tablet 11  . prochlorperazine (COMPAZINE) 10 MG tablet Take 1 tablet (10 mg total) by mouth every 6 (six) hours as needed (Nausea or vomiting). 30 tablet 1  . Trolamine Salicylate (ASPERCREME EX) Apply 1 application topically daily as needed (pain).     No current facility-administered medications for this visit.    ALLERGIES:  Allergies  Allergen Reactions  . Xtandi [Enzalutamide] Other (See Comments)    "Severe personality change" and blood clots  . Penicillins Hives    PHYSICAL EXAM:  Performance status (ECOG): 1 - Symptomatic but completely ambulatory  Vitals:   10/06/20 1053  BP: (!) 108/55  Pulse: 80  Resp: 18  Temp: 97.8 F (36.6 C)  SpO2: 98%   Wt Readings from Last 3 Encounters:  10/06/20 257 lb 14.4 oz (117 kg)  09/15/20 259 lb 4.2 oz (117.6 kg)  09/02/20 253 lb 12.8 oz (115.1 kg)   Physical Exam Vitals reviewed.  Constitutional:      Appearance: Normal appearance. He is obese.  Cardiovascular:     Rate and Rhythm: Normal rate and regular rhythm.     Pulses: Normal pulses.     Heart  sounds: Normal heart sounds.  Pulmonary:     Effort: Pulmonary effort is normal.     Breath sounds: Normal breath sounds.  Chest:     Comments: Port-a-Cath in R chest Musculoskeletal:     Right lower leg: Edema (wearing compression socks) present.     Left lower leg: Edema (wearing compression socks) present.  Neurological:     General: No focal deficit present.     Mental Status: He is alert and oriented to person, place, and time.  Psychiatric:        Mood and Affect: Mood normal.        Behavior: Behavior normal.     LABORATORY DATA:  I have reviewed the labs as listed.  CBC Latest Ref Rng & Units 10/06/2020 09/15/2020 09/02/2020  WBC 4.0 - 10.5 K/uL 7.4 7.9 18.7(H)  Hemoglobin 13.0 - 17.0 g/dL 10.8(L) 11.3(L) 12.0(L)  Hematocrit 39.0 - 52.0 % 32.8(L) 34.1(L) 36.7(L)  Platelets 150 - 400 K/uL 166 160 150   CMP Latest Ref Rng & Units 10/06/2020 09/15/2020 09/02/2020  Glucose 70 - 99 mg/dL 134(H) 113(H) 139(H)  BUN 8 - 23 mg/dL 30(H) 30(H) 26(H)  Creatinine 0.61 - 1.24 mg/dL 1.07 0.97 1.19  Sodium 135 - 145 mmol/L 139 139 141  Potassium 3.5 - 5.1 mmol/L 4.5 4.2 4.7  Chloride 98 - 111 mmol/L 109 110 111  CO2 22 - 32 mmol/L _0 Calcium 8.9 - 10.3 mg/dL 8.5(L) 9.1 9.1  Total Protein 6.5 - 8.1 g/dL 5.7(L) 5.9(L) 6.3(L)  Total Bilirubin 0.3 - 1.2 mg/dL 0.6 0.7 0.6  Alkaline Phos 38 - 126 U/L 72 75 107  AST 15 -  41 U/L _0 ALT 0 - 44 U/L _1 PSA 32.93 (H) 09/15/2020  PSA 26.13 (H) 09/02/2020  PSA 29.53 (H) 08/25/2020    DIAGNOSTIC IMAGING:  I have independently reviewed the scans and discussed with the patient. NM Myocar Multi W/Spect W/Wall Motion / EF  Result Date: 09/08/2020  No diagnostic ST segment changes to indicate ischemia.  Moderate intensity, apical to basal inferolateral defect that is partially reversible suggesting scar with mild peri-infarct ischemia. There is also radiotracer uptake within the gut adjacent to the inferior wall on rest imaging  which could be causing artifactual reversibility in this area.  This is an intermediate risk study, based on calculated LVEF. Suggest echocardiogram to clarify if not performed recently.  Nuclear stress EF: 39%.    ECHOCARDIOGRAM COMPLETE  Result Date: 09/10/2020    ECHOCARDIOGRAM REPORT   Patient Name:   MUHAMMADALI RIES Date of Exam: 09/10/2020 Medical Rec #:  [id number]     Height:       70.5 in Accession #:    [id number]    Weight:       253.8 lb Date of Birth:  July 15, 1955     BSA:          2.322 m Patient Age:    24 years      BP:           120/70 mmHg Patient Gender: M             HR:           82 bpm. Exam Location:  [facility] Procedure: 2D Echo, Cardiac Doppler and Color Doppler Indications:    CAD Native Vessel I25.10  History:        Patient has prior history of Echocardiogram examinations, most                 recent 12/10/2019. CAD, COPD; Risk Factors:Hypertension and                 Diabetes. ST elevation (STEM) and non-ST elevation (NSTEMI)                 myocardial infarction, Jones.  Sonographer:    Alvino Chapel RCS Referring Phys: [place]  1. Left ventricular ejection fraction, by estimation, is 50 to 55%. The left ventricle has low normal function. The left ventricle demonstrates regional wall motion abnormalities (see scoring diagram/findings for description). The left ventricular internal cavity size was mildly dilated. There is mild left ventricular hypertrophy. Left ventricular diastolic parameters were normal.  2. Right ventricular systolic function is normal. The right ventricular size is normal. Tricuspid regurgitation signal is inadequate for assessing PA pressure.  3. Left atrial size was mildly dilated.  4. The mitral valve is grossly normal. Trivial mitral valve regurgitation.  5. The aortic valve is tricuspid. Aortic valve regurgitation is mild. Mild aortic valve sclerosis is present, with no evidence of aortic valve stenosis.  6. The inferior vena cava is  normal in size with greater than 50% respiratory variability, suggesting right atrial pressure of 3 mmHg. FINDINGS  Left Ventricle: Left ventricular ejection fraction, by estimation, is 50 to 55%. The left ventricle has low normal function. The left ventricle demonstrates regional wall motion abnormalities. The left ventricular internal cavity size was mildly dilated. There is mild left ventricular hypertrophy. Left ventricular diastolic parameters were normal.  LV Wall Scoring: The inferior wall, basal inferolateral segment, mid inferoseptal segment,  and apical septal segment are hypokinetic. The entire anterior wall, antero-lateral wall, mid and distal lateral wall, anterior septum, apical inferior segment, basal inferoseptal segment, and apex are normal. Right Ventricle: The right ventricular size is normal. No increase in right ventricular wall thickness. Right ventricular systolic function is normal. Tricuspid regurgitation signal is inadequate for assessing PA pressure. Left Atrium: Left atrial size was mildly dilated. Right Atrium: Right atrial size was normal in size. Pericardium: There is no evidence of pericardial effusion. Mitral Valve: The mitral valve is grossly normal. Mild mitral annular calcification. Trivial mitral valve regurgitation. Tricuspid Valve: The tricuspid valve is grossly normal. Tricuspid valve regurgitation is mild. Aortic Valve: The aortic valve is tricuspid. There is mild aortic valve annular calcification. Aortic valve regurgitation is mild. Aortic regurgitation PHT measures 306 msec. Mild aortic valve sclerosis is present, with no evidence of aortic valve stenosis. Pulmonic Valve: The pulmonic valve was grossly normal. Pulmonic valve regurgitation is trivial. Aorta: The aortic root is normal in size and structure. Venous: The inferior vena cava is normal in size with greater than 50% respiratory variability, suggesting right atrial pressure of 3 mmHg. IAS/Shunts: The interatrial  septum appears to be lipomatous. No atrial level shunt detected by color flow Doppler.  LEFT VENTRICLE PLAX 2D LVIDd:         5.50 cm  Diastology LVIDs:         3.60 cm  LV e' medial:    9.90 cm/s LV PW:         1.00 cm  LV E/e' medial:  9.6 LV IVS:        1.00 cm  LV e' lateral:   11.50 cm/s LVOT diam:     1.90 cm  LV E/e' lateral: 8.3 LV SV:         64 LV SV Index:   28 LVOT Area:     2.84 cm  RIGHT VENTRICLE RV S prime:     14.00 cm/s TAPSE (M-mode): 2.2 cm LEFT ATRIUM             Index       RIGHT ATRIUM           Index LA diam:        4.40 cm 1.89 cm/m  RA Area:     21.80 cm LA Vol (A2C):   85.5 ml 36.82 ml/m RA Volume:   66.50 ml  28.64 ml/m LA Vol (A4C):   83.2 ml 35.83 ml/m LA Biplane Vol: 84.3 ml 36.31 ml/m  AORTIC VALVE LVOT Vmax:   114.00 cm/s LVOT Vmean:  79.400 cm/s LVOT VTI:    0.226 m AI PHT:      306 msec  AORTA Ao Root diam: 3.50 cm MITRAL VALVE MV Area (PHT): 3.70 cm     SHUNTS MV Decel Time: 205 msec     Systemic VTI:  0.23 m MV E velocity: 95.50 cm/s   Systemic Diam: 1.90 cm MV A velocity: 115.00 cm/s MV E/A ratio:  0.83 Rozann Lesches MD Electronically signed by Rozann Lesches MD Signature Date/Time: 09/10/2020/11:34:24 AM    Final      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 -Abiraterone and prednisone from 06/16/2018 through 11/06/2019 with progression. -Xtandi started around 11/21/2019, discontinued on 12/05/2019 due to mood changes. -ST elevation MI involving the right coronary artery on 12/10/2019, status post cardiac catheterization showing thrombotic occlusion of the proximal RCA, successful  PCI with stent placement. -[employer] testing was negativefor germline mutations. -Guardant 360 showed AR amplification. No other mutations. -Bicalutamide 50 mg dailyfrom 04/02/2020 through 05/12/2020 with progression. -Bone scan on 06/02/2020 with increased activity throughout the sacrum with no other  bony abnormalities. -CT CAP from 06/02/2020 with no visceral metastatic disease. -[facility] [place] is not compatible with Brilinta. -Docetaxel started on 06/23/2020. Initial discussion was to reassess response after 6 cycles of chemotherapy. If he has excellent response, and if he is not tolerating well, we may observe at that time.   PLAN:  1. 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 - Bottom of the feet feels puffy for several years.  No numbness reported. - Reviewed labs from today which showed normal LFTs.  White count and platelets were normal.  PSA on 09/15/2020 was 32.9. - He will proceed with cycle 6 of docetaxel today. - Recommend CTAP and bone scan prior to next visit in 3 weeks.  We will follow up on PSA today. - He reported increased frequency, with weak stream with no urgency or pain.  Would recommend checking urinalysis.  He had a prior prostatectomy.  2. Bone metastasis: -Continue calcium supplements.  He will receive denosumab today.  3.Elevated blood sugars: -Continue Jardiance.  4. CADand stents: -Continue aspirin and Plavix.   Orders placed this encounter:  Orders Placed This Encounter  Procedures  . CT Abdomen Pelvis W Contrast  . NM Bone Scan Whole Body  . Urinalysis, Routine w reflex microscopic     Derek Jack, MD [place] [phone]   I, Milinda Antis, am acting as a scribe for Dr. Sanda Linger.  I, Derek Jack MD, have reviewed the above documentation for accuracy and completeness, and I agree with the above.

## 2020-10-06 NOTE — Progress Notes (Signed)
Patient presents today for Taxotere infusion and Xgeva injection.  Patient is taking calcium and has not had any dental work or jaw pain.  Vital signs and labs within parameters for treatment.  Patient has no new complaints at this time.    Message received from Dr. Delton Coombes patient okay for treatment.  Taxotere infusion and Xgeva injection given today per MD orders.  Stable during infusion and injection without adverse affects.  Injection site WNL.  Vital signs stable.  No complaints at this time.  Discharge from clinic ambulatory in stable condition.  Alert and oriented X 3.  Follow up with [facility] as scheduled.

## 2020-10-08 ENCOUNTER — Telehealth (HOSPITAL_COMMUNITY): Payer: Medicare Other | Admitting: Hematology

## 2020-10-08 ENCOUNTER — Other Ambulatory Visit: Payer: Self-pay

## 2020-10-08 ENCOUNTER — Encounter (HOSPITAL_COMMUNITY): Payer: Self-pay

## 2020-10-08 ENCOUNTER — Inpatient Hospital Stay (HOSPITAL_COMMUNITY): Payer: Medicare Other

## 2020-10-08 VITALS — BP 114/57 | HR 87 | Temp 96.9°F | Resp 18

## 2020-10-08 DIAGNOSIS — Z95828 Presence of other vascular implants and grafts: Secondary | ICD-10-CM

## 2020-10-08 DIAGNOSIS — Z5111 Encounter for antineoplastic chemotherapy: Secondary | ICD-10-CM | POA: Diagnosis not present

## 2020-10-08 DIAGNOSIS — C61 Malignant neoplasm of prostate: Secondary | ICD-10-CM

## 2020-10-08 MED ORDER — PEGFILGRASTIM-JMDB 6 MG/0.6ML ~~LOC~~ SOSY
6.0000 mg | PREFILLED_SYRINGE | Freq: Once | SUBCUTANEOUS | Status: AC
  Administered 2020-10-08: 6 mg via SUBCUTANEOUS
  Filled 2020-10-08: qty 0.6

## 2020-10-08 NOTE — Patient Instructions (Signed)
[facility]  Discharge Instructions: Thank you for choosing [facility] to provide your oncology and hematology care.  If you have a lab appointment with the [facility], please come in thru the Main Entrance and check in at the main information desk.  Wear comfortable clothing and clothing appropriate for easy access to any Portacath or PICC line.   We strive to give you quality time with your provider. You may need to reschedule your appointment if you arrive late (15 or more minutes).  Arriving late affects you and other patients whose appointments are after yours.  Also, if you miss three or more appointments without notifying the office, you may be dismissed from the clinic at the provider's discretion.      For prescription refill requests, have your pharmacy contact our office and allow 72 hours for refills to be completed.    Today you received your Fulphila injection, return as scheduled.    To help prevent nausea and vomiting after your treatment, we encourage you to take your nausea medication as directed.  BELOW ARE SYMPTOMS THAT SHOULD BE REPORTED IMMEDIATELY: . *FEVER GREATER THAN 100.4 F (38 C) OR HIGHER . *CHILLS OR SWEATING . *NAUSEA AND VOMITING THAT IS NOT CONTROLLED WITH YOUR NAUSEA MEDICATION . *UNUSUAL SHORTNESS OF BREATH . *UNUSUAL BRUISING OR BLEEDING . *URINARY PROBLEMS (pain or burning when urinating, or frequent urination) . *BOWEL PROBLEMS (unusual diarrhea, constipation, pain near the anus) . TENDERNESS IN MOUTH AND THROAT WITH OR WITHOUT PRESENCE OF ULCERS (sore throat, sores in mouth, or a toothache) . UNUSUAL RASH, SWELLING OR PAIN  . UNUSUAL VAGINAL DISCHARGE OR ITCHING   Items with * indicate a potential emergency and should be followed up as soon as possible or go to the Emergency Department if any problems should occur.  Please show the CHEMOTHERAPY ALERT CARD or IMMUNOTHERAPY ALERT CARD at check-in to the Emergency  Department and triage nurse.  Should you have questions after your visit or need to cancel or reschedule your appointment, please contact [facility] [phone]  and follow the prompts.  Office hours are 8:00 a.m. to 4:30 p.m. Monday - Friday. Please note that voicemails left after 4:00 p.m. may not be returned until the following business day.  We are closed weekends and major holidays. You have access to a nurse at all times for urgent questions. Please call the main number to the clinic [phone] and follow the prompts.  For any non-urgent questions, you may also contact your provider using MyChart. We now offer e-Visits for anyone 59 and older to request care online for non-urgent symptoms. For details visit [email or web address].   Also download the MyChart app! Go to the app store, search "MyChart", open the app, select [facility], and log in with your MyChart username and password.  Due to Covid, a mask is required upon entering the hospital/clinic. If you do not have a mask, one will be given to you upon arrival. For doctor visits, patients may have 1 support person aged 67 or older with them. For treatment visits, patients cannot have anyone with them due to current Covid guidelines and our immunocompromised population.

## 2020-10-08 NOTE — Progress Notes (Unsigned)
Patient tolerated Fulphila injection with no complaints voiced. Site clean and dry with no bruising or swelling noted at site. See MAR for details. Band aid applied.  Patient stable during and after injection. VSS with discharge and left in satisfactory condition with no s/s of distress noted.

## 2020-10-23 ENCOUNTER — Other Ambulatory Visit: Payer: Self-pay

## 2020-10-23 ENCOUNTER — Ambulatory Visit (HOSPITAL_COMMUNITY)
Admission: RE | Admit: 2020-10-23 | Discharge: 2020-10-23 | Disposition: A | Discharge status: Home / Self Care | Payer: Medicare Other | Source: Ambulatory Visit | Attending: Hematology | Admitting: Hematology

## 2020-10-23 DIAGNOSIS — C61 Malignant neoplasm of prostate: Secondary | ICD-10-CM | POA: Diagnosis present

## 2020-10-23 DIAGNOSIS — C7951 Secondary malignant neoplasm of bone: Secondary | ICD-10-CM | POA: Diagnosis present

## 2020-10-23 MED ORDER — TECHNETIUM TC 99M MEDRONATE IV KIT
20.0000 | PACK | Freq: Once | INTRAVENOUS | Status: AC | PRN
  Administered 2020-10-23: 20.4 via INTRAVENOUS

## 2020-10-30 ENCOUNTER — Other Ambulatory Visit (HOSPITAL_COMMUNITY): Payer: Self-pay

## 2020-10-30 DIAGNOSIS — C7951 Secondary malignant neoplasm of bone: Secondary | ICD-10-CM

## 2020-10-30 DIAGNOSIS — C61 Malignant neoplasm of prostate: Secondary | ICD-10-CM

## 2020-10-31 ENCOUNTER — Inpatient Hospital Stay (HOSPITAL_COMMUNITY): Payer: Medicare Other | Attending: Hematology

## 2020-10-31 ENCOUNTER — Other Ambulatory Visit: Payer: Self-pay

## 2020-10-31 ENCOUNTER — Ambulatory Visit (HOSPITAL_COMMUNITY)
Admission: RE | Admit: 2020-10-31 | Discharge: 2020-10-31 | Disposition: A | Discharge status: Home / Self Care | Payer: Medicare Other | Source: Ambulatory Visit | Attending: Hematology | Admitting: Hematology

## 2020-10-31 ENCOUNTER — Other Ambulatory Visit (HOSPITAL_COMMUNITY): Payer: Medicare Other

## 2020-10-31 DIAGNOSIS — Z87891 Personal history of nicotine dependence: Secondary | ICD-10-CM | POA: Insufficient documentation

## 2020-10-31 DIAGNOSIS — C7951 Secondary malignant neoplasm of bone: Secondary | ICD-10-CM

## 2020-10-31 DIAGNOSIS — I252 Old myocardial infarction: Secondary | ICD-10-CM | POA: Insufficient documentation

## 2020-10-31 DIAGNOSIS — I2510 Atherosclerotic heart disease of native coronary artery without angina pectoris: Secondary | ICD-10-CM | POA: Insufficient documentation

## 2020-10-31 DIAGNOSIS — Z79899 Other long term (current) drug therapy: Secondary | ICD-10-CM | POA: Diagnosis not present

## 2020-10-31 DIAGNOSIS — C61 Malignant neoplasm of prostate: Secondary | ICD-10-CM | POA: Diagnosis present

## 2020-10-31 DIAGNOSIS — Z5111 Encounter for antineoplastic chemotherapy: Secondary | ICD-10-CM | POA: Insufficient documentation

## 2020-10-31 DIAGNOSIS — E119 Type 2 diabetes mellitus without complications: Secondary | ICD-10-CM | POA: Diagnosis not present

## 2020-10-31 LAB — COMPREHENSIVE METABOLIC PANEL
ALT: 22 U/L (ref 0–44)
AST: 27 U/L (ref 15–41)
Albumin: 3.6 g/dL (ref 3.5–5.0)
Alkaline Phosphatase: 85 U/L (ref 38–126)
Anion gap: 7 (ref 5–15)
BUN: 28 mg/dL — ABNORMAL HIGH (ref 8–23)
CO2: 24 mmol/L (ref 22–32)
Calcium: 9.1 mg/dL (ref 8.9–10.3)
Chloride: 105 mmol/L (ref 98–111)
Creatinine, Ser: 1.18 mg/dL (ref 0.61–1.24)
GFR, Estimated: >60 mL/min (ref >60)
Glucose, Bld: 116 mg/dL — ABNORMAL HIGH (ref 70–99)
Potassium: 4.7 mmol/L (ref 3.5–5.1)
Sodium: 136 mmol/L (ref 135–145)
Total Bilirubin: 0.6 mg/dL (ref 0.3–1.2)
Total Protein: 6.2 g/dL — ABNORMAL LOW (ref 6.5–8.1)

## 2020-10-31 LAB — CBC WITH DIFFERENTIAL/PLATELET
Abs Immature Granulocytes: 0.04 10*3/uL (ref 0.00–0.07)
Basophils Absolute: 0.1 10*3/uL (ref 0.0–0.1)
Basophils Relative: 1 %
Eosinophils Absolute: 0.3 10*3/uL (ref 0.0–0.5)
Eosinophils Relative: 3 %
HCT: 36.8 % — ABNORMAL LOW (ref 39.0–52.0)
Hemoglobin: 11.9 g/dL — ABNORMAL LOW (ref 13.0–17.0)
Immature Granulocytes: 0 %
Lymphocytes Relative: 13 %
Lymphs Abs: 1.2 10*3/uL (ref 0.7–4.0)
MCH: 33.6 pg (ref 26.0–34.0)
MCHC: 32.3 g/dL (ref 30.0–36.0)
MCV: 104 fL — ABNORMAL HIGH (ref 80.0–100.0)
Monocytes Absolute: 0.6 10*3/uL (ref 0.1–1.0)
Monocytes Relative: 6 %
Neutro Abs: 7.4 10*3/uL (ref 1.7–7.7)
Neutrophils Relative %: 77 %
Platelets: 163 10*3/uL (ref 150–400)
RBC: 3.54 MIL/uL — ABNORMAL LOW (ref 4.22–5.81)
RDW: 14.6 % (ref 11.5–15.5)
WBC: 9.6 10*3/uL (ref 4.0–10.5)
nRBC: 0 % (ref 0.0–0.2)

## 2020-10-31 LAB — MAGNESIUM: Magnesium: 1.8 mg/dL (ref 1.7–2.4)

## 2020-10-31 LAB — PSA: Prostatic Specific Antigen: 44.17 ng/mL — ABNORMAL HIGH (ref 0.00–4.00)

## 2020-11-04 NOTE — Progress Notes (Signed)
[facility] 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 Radical resection of prostate on 06/27/2014. 2. Zytiga from 05/19/2018 to 11/09/2019. 3. Xtandi from 11/21/2019 to 12/05/2019, stopped due to mood changes  NGS Results: Guardant AR amplification, MSI normal, TMB 16 Muts/Mb  CURRENT THERAPY: Docetaxel every 3 weeks; Xgeva monthly; Lupron every 3 months  BRIEF ONCOLOGIC HISTORY:  Oncology History  Prostate cancer (HCC)  02/06/2014 Procedure   Prostate biopsy by Dr. Wendall Jones   02/11/2014 Pathology Results   Prostatic adenocarcinoma identified in 5 of 6 prostate specimens with Gleason pattern showing primary pattern-grade 4, secondary pattern grade 3.  Total Gleason score equals 7.  Proportion of prostatic tissue involved by tumor: 70%.   05/03/2014 Imaging   Bone scan- Negative    06/27/2014 Procedure   Radical resection of prostate by Dr. Heloise Jones   07/29/2014 Pathology Results   1. Prostate, radical resection - PROSTATIC ADENOCARCINOMA, GLEASON SCORE 4 + 3 = 7. - RIGHT AND LEFT PROSTATE INVOLVED. - RIGHT AND LEFT SEMINAL VESICLES INVOLVED BY TUMOR. - EXTRACAPSULAR EXTENSION BY TUMOR. - TUMOR EXTENDS INTO BLADDER NECK TISSUE. - MARGINS NOT INVOLVED. 2. Lymph nodes, regional resection, left pelvic - THREE BENIGN LYMPH NODES (0/3). 3. Lymph nodes, regional resection, right pelvic - FOUR BENIGN LYMPH NODES (0/4).   11/16/2016 Imaging   Bone scan- New areas of increased uptake superolateral to the left orbit (faint), at S1, and in the posterolateral aspect of the left sixth rib.    11/25/2016 Imaging   CT CAP- 1. Status post radical prostatectomy. No findings to suggest local recurrence of disease or definite extraskeletal metastatic disease in the  chest, abdomen or pelvis. However, there are several osseous lesions, as above, concerning for metastatic disease to the bones. 2. Hepatic steatosis. 3. Aortic atherosclerosis, in addition to 2 vessel coronary artery disease. Please note that although the presence of coronary artery calcium documents the presence of coronary artery disease, the severity of this disease and any potential stenosis cannot be assessed on this non-gated CT examination. Assessment for potential risk factor modification, dietary therapy or pharmacologic therapy may be warranted, if clinically indicated. 4. Additional incidental findings, as above.    Genetic Testing   Guardant 360 Results:         06/23/2020 -  Chemotherapy    Patient is on Treatment Plan: PROSTATE DOCETAXEL + PREDNISONE Q21D        CANCER STAGING: Cancer Staging Prostate cancer [facility]) Staging form: Prostate, AJCC 7th Edition - Pathologic stage from 07/29/2014: Stage III (T3b, N0, cM0, Gleason 7) - Signed by Hunter Newer, PA-C on 11/09/2016 - Clinical: Stage IV (T3, N0, M1, PSA: Less than 10, Gleason 7) - Signed by Hunter Cork, MD on 12/21/2016 - Pathologic: No stage assigned - Unsigned   INTERVAL HISTORY:  [name], a 65 y.o. 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 Hunter Jones was last seen on 10/06/2020.   Today he reports feeling good. He reports it takes a long time/is difficult to urinate; this is unchanged from his prior visit.    REVIEW OF SYSTEMS:  Review of Systems  Constitutional: Positive for fatigue (75%). Negative for appetite change.  Respiratory: Positive for cough and shortness of  breath.   Cardiovascular: Positive for leg swelling.  Genitourinary: Positive for difficulty urinating.   Neurological: Positive for dizziness (occasional).  All other systems reviewed and are negative.   PAST MEDICAL/SURGICAL HISTORY:  Past Medical History:  Diagnosis Date  . Acute  ST elevation myocardial infarction (STEMI) of inferior wall ([facility]) 11/2019  . Asthma   . COPD (chronic obstructive pulmonary disease) ([place] [facility])   . Coronary artery disease    DES to mid LAD August 2014 ([facility]); overlapping DESx2 prox-mid RCA 11/2019  . Essential hypertension   . Hyperthyroidism   . NSTEMI (non-ST elevated myocardial infarction) ([place]) 2014  . Port-A-Cath in place 06/17/2020  . Prostate cancer ([facility])   . Type 2 diabetes mellitus ([place])    Past Surgical History:  Procedure Laterality Date  . APPENDECTOMY    . CORONARY/GRAFT ACUTE MI REVASCULARIZATION N/A 12/10/2019   Procedure: Coronary/Graft Acute MI Revascularization;  Surgeon: Hunter Bush, MD;  Location: [facility];  Service: Cardiovascular;  Laterality: N/A;  . IR IMAGING GUIDED PORT INSERTION  06/19/2020  . LEFT HEART CATH AND CORONARY ANGIOGRAPHY N/A 12/10/2019   Procedure: LEFT HEART CATH AND CORONARY ANGIOGRAPHY;  Surgeon: Hunter Bush, MD;  Location: [facility];  Service: Cardiovascular;  Laterality: N/A;  . LYMPHADENECTOMY Bilateral 06/27/2014   Procedure: LYMPHADENECTOMY;  Surgeon: Hunter Bring, MD;  Location: [facility];  Service: Urology;  Laterality: Bilateral;  . ROBOT ASSISTED LAPAROSCOPIC RADICAL PROSTATECTOMY N/A 06/27/2014   Procedure: ROBOTIC ASSISTED LAPAROSCOPIC RADICAL PROSTATECTOMY LEVEL 3;  Surgeon: Hunter Bring, MD;  Location: [facility];  Service: Urology;  Laterality: N/A;  . TONSILLECTOMY      SOCIAL HISTORY:  Social History   Socioeconomic History  . Marital status: Married    Spouse name: Not on file  . Number of children: Not on file  . Years of education: Not on file  . Highest education level: Not on file  Occupational History  . Not on file  Tobacco Use  . Smoking status: Former Smoker    Years: 40.00    Types: Cigarettes    Quit date: 06/21/2012    Years since quitting: 8.3  . Smokeless tobacco: Never Used  Vaping Use  . Vaping Use: Never used  Substance and Sexual  Activity  . Alcohol use: Yes    Comment: occasional  . Drug use: No  . Sexual activity: Not on file  Other Topics Concern  . Not on file  Social History Narrative  . Not on file   Social Determinants of Health   Financial Resource Strain: Low Risk   . Difficulty of Paying Living Expenses: Not hard at all  Food Insecurity: No Food Insecurity  . Worried About Charity fundraiser in the Last Year: Never true  . Ran Out of Food in the Last Year: Never true  Transportation Needs: No Transportation Needs  . Lack of Transportation (Medical): No  . Lack of Transportation (Non-Medical): No  Physical Activity: Inactive  . Days of Exercise per Week: 0 days  . Minutes of Exercise per Session: 0 min  Stress: No Stress Concern Present  . Feeling of Stress : Not at all  Social Connections: Moderately Integrated  . Frequency of Communication with Friends and Family: More than three times a week  . Frequency of Social Gatherings with Friends and Family: Never  . Attends Religious Services: Never  . Active Member of Clubs or Organizations: Yes  . Attends Archivist Meetings: Never  .  Marital Status: Married  Human resources officer Violence: Not At Risk  . Fear of Current or Ex-Partner: No  . Emotionally Abused: No  . Physically Abused: No  . Sexually Abused: No    FAMILY HISTORY:  Family History  Problem Relation Age of Onset  . Dementia Mother   . Thyroid disease Mother   . Clotting disorder Mother   . Kidney Stones Father   . Stroke Brother   . Alzheimer's disease Maternal Uncle   . Tuberculosis Paternal Grandmother   . Heart attack Maternal Uncle        x4  . Heart attack Cousin        in his 44s    CURRENT MEDICATIONS:  Current Outpatient Medications  Medication Sig Dispense Refill  . albuterol (PROVENTIL HFA;VENTOLIN HFA) 108 (90 BASE) MCG/ACT inhaler Inhale 1-2 puffs into the lungs every 6 (six) hours as needed for wheezing or shortness of breath.    . Ascorbic Acid  (VITAMIN C PO) Take 1 tablet by mouth daily.    [name] Kitchen aspirin EC 81 MG tablet Take 81 mg by mouth 3 (three) times a week.    [name] Kitchen atorvastatin (LIPITOR) 40 MG tablet TAKE 1 TABLET BY MOUTH AT BEDTIME. (Patient taking differently: Take 40 mg by mouth at bedtime.) 30 tablet 6  . Calcium Carb-Cholecalciferol (HM CALCIUM-VITAMIN D) 600-800 MG-UNIT TABS Take 1 tablet by mouth 2 (two) times daily.    . Coenzyme Q10 (COQ10) 200 MG CAPS Take 200 mg by mouth daily.    [name] Kitchen denosumab (XGEVA) 120 MG/1.7ML SOLN injection Inject 120 mg into the skin every 30 (thirty) days.    . DOCETAXEL IV Inject into the vein every 21 ( twenty-one) days.    . fluticasone-salmeterol (ADVAIR HFA) 115-21 MCG/ACT inhaler Inhale 2 puffs into the lungs 2 (two) times daily.     [name] Kitchen ibuprofen (ADVIL) 200 MG tablet Take 200 mg by mouth every 6 (six) hours as needed for headache or mild pain.    . Ibuprofen-diphenhydrAMINE HCl (IBUPROFEN PM) 200-25 MG CAPS Take 1 tablet by mouth at bedtime as needed (sleep).    [name] Kitchen KRILL OIL PO Take 1 capsule by mouth daily.    [name] Kitchen Leuprolide Acetate, 3 Month, (LUPRON DEPOT, 47-MONTH, IM) Inject 1 Dose into the muscle every 3 (three) months.    . lidocaine-prilocaine (EMLA) cream Apply a small amount to port a cath site and cover with plastic wrap 1 hour prior to chemotherapy appointments 30 g 3  . Lutein 20 MG CAPS Take 20 mg by mouth daily.    . methimazole (TAPAZOLE) 10 MG tablet Take 10 mg by mouth every other day.     . metoprolol succinate (TOPROL-XL) 25 MG 24 hr tablet TAKE 1 TABLET BY MOUTH EVERY DAY (Patient taking differently: Take 25 mg by mouth daily.) 90 tablet 1  . Multiple Vitamin (MULTIVITAMIN) tablet Take 1 tablet by mouth daily.    . nitroGLYCERIN (NITROSTAT) 0.4 MG SL tablet Place 1 tablet (0.4 mg total) under the tongue every 5 (five) minutes as needed. 25 tablet 12  . Potassium 99 MG TABS Take 99 mg by mouth daily.    . predniSONE (DELTASONE) 5 MG tablet Take 1 tablet (5 mg total) by mouth daily  with breakfast. 60 tablet 11  . prochlorperazine (COMPAZINE) 10 MG tablet Take 1 tablet (10 mg total) by mouth every 6 (six) hours as needed (Nausea or vomiting). 30 tablet 1  . Trolamine Salicylate (ASPERCREME EX) Apply 1 application topically  daily as needed (pain).     No current facility-administered medications for this visit.    ALLERGIES:  Allergies  Allergen Reactions  . Xtandi [Enzalutamide] Other (See Comments)    "Severe personality change" and blood clots  . Penicillins Hives    PHYSICAL EXAM:  Performance status (ECOG): 1 - Symptomatic but completely ambulatory  There were no vitals filed for this visit. Wt Readings from Last 3 Encounters:  10/06/20 257 lb 14.4 oz (117 kg)  09/15/20 259 lb 4.2 oz (117.6 kg)  09/02/20 253 lb 12.8 oz (115.1 kg)   Physical Exam Vitals reviewed.  Constitutional:      Appearance: Normal appearance.  Cardiovascular:     Rate and Rhythm: Normal rate and regular rhythm.     Pulses: Normal pulses.     Heart sounds: Normal heart sounds.  Pulmonary:     Effort: Pulmonary effort is normal.     Breath sounds: Normal breath sounds.  Musculoskeletal:     Right lower leg: No edema.     Left lower leg: No edema.  Neurological:     General: No focal deficit present.     Mental Status: He is alert and oriented to person, place, and time.  Psychiatric:        Mood and Affect: Mood normal.        Behavior: Behavior normal.      LABORATORY DATA:  I have reviewed the labs as listed.  CBC Latest Ref Rng & Units 10/31/2020 10/06/2020 09/15/2020  WBC 4.0 - 10.5 K/uL 9.6 7.4 7.9  Hemoglobin 13.0 - 17.0 g/dL 11.9(L) 10.8(L) 11.3(L)  Hematocrit 39.0 - 52.0 % 36.8(L) 32.8(L) 34.1(L)  Platelets 150 - 400 K/uL 163 166 160   CMP Latest Ref Rng & Units 10/31/2020 10/06/2020 09/15/2020  Glucose 70 - 99 mg/dL 116(H) 134(H) 113(H)  BUN 8 - 23 mg/dL 28(H) 30(H) 30(H)  Creatinine 0.61 - 1.24 mg/dL 1.18 1.07 0.97  Sodium 135 - 145 mmol/L 136 139 139   Potassium 3.5 - 5.1 mmol/L 4.7 4.5 4.2  Chloride 98 - 111 mmol/L 105 109 110  CO2 22 - 32 mmol/L $RemoveB'24 24 22  'iBriLsdF$ Calcium 8.9 - 10.3 mg/dL 9.1 8.5(L) 9.1  Total Protein 6.5 - 8.1 g/dL 6.2(L) 5.7(L) 5.9(L)  Total Bilirubin 0.3 - 1.2 mg/dL 0.6 0.6 0.7  Alkaline Phos 38 - 126 U/L 85 72 75  AST 15 - 41 U/L $Remo'27 25 26  'euXLp$ ALT 0 - 44 U/L $Remo'22 23 23    'JkVch$ DIAGNOSTIC IMAGING:  I have independently reviewed the scans and discussed with the patient. CT ABDOMEN PELVIS WO CONTRAST  Result Date: 11/02/2020 CLINICAL DATA:  Follow-up metastatic prostate carcinoma. EXAM: CT ABDOMEN AND PELVIS WITHOUT CONTRAST TECHNIQUE: Multidetector CT imaging of the abdomen and pelvis was performed following the standard protocol without IV contrast. COMPARISON:  06/02/2020 FINDINGS: Lower chest: No acute findings. Hepatobiliary: No mass visualized on this unenhanced exam. Gallbladder is unremarkable. No evidence of biliary ductal dilatation. Pancreas: No mass or inflammatory process visualized on this unenhanced exam. Spleen:  Within normal limits in size. Adrenals/Urinary tract: No evidence of urolithiasis or hydronephrosis. Unremarkable unopacified urinary bladder. Stomach/Bowel: No evidence of obstruction, inflammatory process, or abnormal fluid collections. Diverticulosis is seen mainly involving the sigmoid colon, however there is no evidence of diverticulitis. Vascular/Lymphatic: No pathologically enlarged lymph nodes identified. No evidence of abdominal aortic aneurysm. Aortic atherosclerotic calcification noted. Reproductive: Prior prostatectomy again noted. No soft tissue masses identified. Other:  None. Musculoskeletal: Diffuse  sclerotic bone metastases throughout the spine, sacrum, pelvis, ribs, and right femoral head show no significant interval change. IMPRESSION: Diffuse sclerotic bone metastases, without significant interval change. No evidence of soft tissue metastatic disease or other acute findings. Colonic diverticulosis. No  radiographic evidence of diverticulitis. Aortic Atherosclerosis (ICD10-I70.0). Electronically Signed   By: Marlaine Hind M.D.   On: 11/02/2020 18:04   NM Bone Scan Whole Body  Result Date: 10/24/2020 CLINICAL DATA:  Prostate cancer metastatic to bone EXAM: NUCLEAR MEDICINE WHOLE BODY BONE SCAN TECHNIQUE: Whole body anterior and posterior images were obtained approximately 3 hours after intravenous injection of radiopharmaceutical. RADIOPHARMACEUTICALS:  20.4 mCi Technetium-32m MDP IV COMPARISON:  08/19/2020 Radiographic correlation: None recent FINDINGS: Again identified extensive uptake throughout sacrum consistent with osseous metastasis No additional sites of abnormal osseous tracer accumulation are seen. No new scintigraphic findings identified. No uptake is seen at multiple additional sclerotic foci identified by prior cross-sectional imaging. Expected urinary tract and soft tissue distribution of tracer. IMPRESSION: Persistent abnormal tracer uptake throughout sacrum consistent with osseous metastatic disease. No new scintigraphic abnormalities. Electronically Signed   By: Lavonia Dana M.D.   On: 10/24/2020 09:06     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 -Abiraterone and prednisone from 06/16/2018 through 11/06/2019 with progression. -Xtandi started around 11/21/2019, discontinued on 12/05/2019 due to mood changes. -ST elevation MI involving the right coronary artery on 12/10/2019, status post cardiac catheterization showing thrombotic occlusion of the proximal RCA, successful PCI with stent placement. -Invitae testing was negativefor germline mutations. -Guardant 360 showed AR amplification. No other mutations. -Bicalutamide 50 mg dailyfrom 04/02/2020 through 05/12/2020 with progression. -Bone scan on 06/02/2020 with increased activity throughout the sacrum with no other bony abnormalities. -CT CAP from 06/02/2020  with no visceral metastatic disease. -Alford [place] is not compatible with Brilinta. -6 cycles of docetaxel from 06/23/2020 through 10/05/2020.   PLAN:  1. 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  However his PSA level continues to rise.  Latest level is 44.17 on 10/31/2020. - Reviewed CTAP from 10/31/2020 which did not show any soft tissue metastatic disease. - Bone scan from 10/23/2020 was reviewed which showed persistent abnormal uptake throughout the sacrum, similar to prior scan.  No new abnormalities. - We discussed further options including radium-223 and lutetium PSMA treatment options. - I will make a referral to Dr. Tammi Klippel in [place]. - We will switch his Lupron to every 6 months. - We will see him back in 2 months with repeat PSA. - He is having some urinary symptoms.  He was told to follow-up with his urologist.  2. Bone metastasis: -Continue calcium supplements.  Continue monthly denosumab.  3.Elevated blood sugars: -Continue Jardiance.  4. CADand stents: -Continue aspirin and Plavix.  He is continuing rehab.   Orders placed this encounter:  No orders of the defined types were placed in this encounter.    Derek Jack, MD [place] [phone]   I, Thana Ates, am acting as a scribe for Dr. Derek Jack.  I, Derek Jack MD, have reviewed the above documentation for accuracy and completeness, and I agree with the above.

## 2020-11-05 ENCOUNTER — Other Ambulatory Visit: Payer: Self-pay

## 2020-11-05 ENCOUNTER — Inpatient Hospital Stay (HOSPITAL_BASED_OUTPATIENT_CLINIC_OR_DEPARTMENT_OTHER): Payer: Medicare Other | Admitting: Hematology

## 2020-11-05 VITALS — BP 106/60 | HR 81 | Temp 96.9°F | Resp 18 | Wt 259.7 lb

## 2020-11-05 DIAGNOSIS — C7951 Secondary malignant neoplasm of bone: Secondary | ICD-10-CM

## 2020-11-05 DIAGNOSIS — C61 Malignant neoplasm of prostate: Secondary | ICD-10-CM | POA: Diagnosis not present

## 2020-11-05 DIAGNOSIS — Z5111 Encounter for antineoplastic chemotherapy: Secondary | ICD-10-CM | POA: Diagnosis not present

## 2020-11-05 NOTE — Patient Instructions (Addendum)
[facility] at [facility] Discharge Instructions  You were seen today by Dr. Katragadda. He went over your recent results. Dr. Katragadda will see you back in 2 months for labs and follow up.   Thank you for choosing [facility] at [facility] to provide your oncology and hematology care.  To afford each patient quality time with our provider, please arrive at least 15 minutes before your scheduled appointment time.   If you have a lab appointment with the Cancer Center please come in thru the Main Entrance and check in at the main information desk  You need to re-schedule your appointment should you arrive 10 or more minutes late.  We strive to give you quality time with our providers, and arriving late affects you and other patients whose appointments are after yours.  Also, if you no show three or more times for appointments you may be dismissed from the clinic at the providers discretion.     Again, thank you for choosing [facility].  Our hope is that these requests will decrease the amount of time that you wait before being seen by our physicians.       _____________________________________________________________  Should you have questions after your visit to [facility], please contact our office at [phone] between the hours of 8:00 a.m. and 4:30 p.m.  Voicemails left after 4:00 p.m. will not be returned until the following business day.  For prescription refill requests, have your pharmacy contact our office and allow 72 hours.    Cancer Center Support Programs:   > Cancer Support Group  2nd Tuesday of the month 1pm-2pm, Journey Room   

## 2020-11-07 ENCOUNTER — Encounter (HOSPITAL_COMMUNITY): Payer: Self-pay

## 2020-11-07 ENCOUNTER — Other Ambulatory Visit: Payer: Self-pay

## 2020-11-07 ENCOUNTER — Inpatient Hospital Stay (HOSPITAL_COMMUNITY): Payer: Medicare Other

## 2020-11-07 VITALS — BP 132/59 | HR 86 | Temp 97.2°F | Resp 19

## 2020-11-07 DIAGNOSIS — Z5111 Encounter for antineoplastic chemotherapy: Secondary | ICD-10-CM | POA: Diagnosis not present

## 2020-11-07 DIAGNOSIS — C61 Malignant neoplasm of prostate: Secondary | ICD-10-CM

## 2020-11-07 MED ORDER — LEUPROLIDE ACETATE (3 MONTH) 22.5 MG ~~LOC~~ KIT
22.5000 mg | PACK | Freq: Once | SUBCUTANEOUS | Status: AC
  Administered 2020-11-07: 22.5 mg via SUBCUTANEOUS
  Filled 2020-11-07: qty 22.5

## 2020-11-07 MED ORDER — DENOSUMAB 120 MG/1.7ML ~~LOC~~ SOLN
120.0000 mg | Freq: Once | SUBCUTANEOUS | Status: AC
  Administered 2020-11-07: 120 mg via SUBCUTANEOUS
  Filled 2020-11-07: qty 1.7

## 2020-11-07 NOTE — Progress Notes (Signed)
Patient presents today for Eligard and XGeva injection. Labs 10/31/20 . Calcium 9.1. Patient denies any upcoming dental work or jaw pain. Vital signs stable. Patient taking Calcium Carb-Cholecalciferol 600-800 mg MG unit tabs.    Vital signs stable. No complaints at this time. Discharged from clinic ambulatory in stable condition. Alert and oriented x 3. F/U with [facility] as scheduled.

## 2020-11-07 NOTE — Patient Instructions (Signed)
[facility]  Discharge Instructions: Thank you for choosing [facility] to provide your oncology and hematology care.  If you have a lab appointment with the [facility], please come in thru the Main Entrance and check in at the main information desk.  Wear comfortable clothing and clothing appropriate for easy access to any Portacath or PICC line.   We strive to give you quality time with your provider. You may need to reschedule your appointment if you arrive late (15 or more minutes).  Arriving late affects you and other patients whose appointments are after yours.  Also, if you miss three or more appointments without notifying the office, you may be dismissed from the clinic at the provider's discretion.      For prescription refill requests, have your pharmacy contact our office and allow 72 hours for refills to be completed.    Today you received the following Eligard and XGeva injection.     To help prevent nausea and vomiting after your treatment, we encourage you to take your nausea medication as directed.  BELOW ARE SYMPTOMS THAT SHOULD BE REPORTED IMMEDIATELY: . *FEVER GREATER THAN 100.4 F (38 C) OR HIGHER . *CHILLS OR SWEATING . *NAUSEA AND VOMITING THAT IS NOT CONTROLLED WITH YOUR NAUSEA MEDICATION . *UNUSUAL SHORTNESS OF BREATH . *UNUSUAL BRUISING OR BLEEDING . *URINARY PROBLEMS (pain or burning when urinating, or frequent urination) . *BOWEL PROBLEMS (unusual diarrhea, constipation, pain near the anus) . TENDERNESS IN MOUTH AND THROAT WITH OR WITHOUT PRESENCE OF ULCERS (sore throat, sores in mouth, or a toothache) . UNUSUAL RASH, SWELLING OR PAIN  . UNUSUAL VAGINAL DISCHARGE OR ITCHING   Items with * indicate a potential emergency and should be followed up as soon as possible or go to the Emergency Department if any problems should occur.  Please show the CHEMOTHERAPY ALERT CARD or IMMUNOTHERAPY ALERT CARD at check-in to the Emergency  Department and triage nurse.  Should you have questions after your visit or need to cancel or reschedule your appointment, please contact [facility] [phone]  and follow the prompts.  Office hours are 8:00 a.m. to 4:30 p.m. Monday - Friday. Please note that voicemails left after 4:00 p.m. may not be returned until the following business day.  We are closed weekends and major holidays. You have access to a nurse at all times for urgent questions. Please call the main number to the clinic [phone] and follow the prompts.  For any non-urgent questions, you may also contact your provider using MyChart. We now offer e-Visits for anyone 52 and older to request care online for non-urgent symptoms. For details visit [email or web address].   Also download the MyChart app! Go to the app store, search "MyChart", open the app, select [facility], and log in with your MyChart username and password.  Due to Covid, a mask is required upon entering the hospital/clinic. If you do not have a mask, one will be given to you upon arrival. For doctor visits, patients may have 1 support person aged 90 or older with them. For treatment visits, patients cannot have anyone with them due to current Covid guidelines and our immunocompromised population.

## 2020-11-13 ENCOUNTER — Encounter: Payer: Self-pay | Admitting: Radiation Oncology

## 2020-11-13 NOTE — Progress Notes (Signed)
Histology and Location of Primary Cancer: metastatic prostate cancer to bone  Sites of Visceral and Bony Metastatic Disease: bony  Location(s) of Symptomatic Metastases: sacrum  Past/Anticipated chemotherapy by medical oncology, if any:  PRIOR THERAPY:  1. Radical resection of prostate on 06/27/2014. 2. Zytiga from 05/19/2018 to 11/09/2019. 3. Xtandi from 11/21/2019 to 12/05/2019, stopped due to mood changes  NGS Results: Guardant AR amplification, MSI normal, TMB 16 Muts/Mb  CURRENT THERAPY: Docetaxel every 3 weeks; Xgeva monthly; Lupron every 3 months  11/07/20 received Eligard and Xgeva  Referral to Dr. Tammi Klippel for consideration of Xofigo   Pain on a scale of 0-10 is: Patient reports occasional aches and pain in his joints managed with Aspercreme. Denies new pain. Denies sacral pain.    If Spine Met(s), symptoms, if any, include:  Bowel/Bladder retention or incontinence (please describe): Reports he wears a depends to bed. Reports nocturia x 3. Denies bowel retention or incontinence.  Numbness or weakness in extremities (please describe): Denies  Current Decadron regimen, if applicable: Prednisone  Ambulatory status? Walker? Wheelchair?: Ambulatory  SAFETY ISSUES:  Prior radiation? Yes, 2016  Pacemaker/ICD? denies  Possible current pregnancy? no, male patient  Is the patient on methotrexate? denies  Current Complaints / other details:  65 year old male. Married with two stepchildren. Resides in [place].

## 2020-11-14 ENCOUNTER — Encounter: Payer: Self-pay | Admitting: Radiation Oncology

## 2020-11-14 ENCOUNTER — Other Ambulatory Visit: Payer: Self-pay

## 2020-11-14 ENCOUNTER — Ambulatory Visit
Admission: RE | Admit: 2020-11-14 | Discharge: 2020-11-14 | Disposition: A | Discharge status: Home / Self Care | Payer: Medicare Other | Source: Ambulatory Visit | Attending: Radiation Oncology | Admitting: Radiation Oncology

## 2020-11-14 VITALS — Ht 71.0 in | Wt 259.0 lb

## 2020-11-14 DIAGNOSIS — C7951 Secondary malignant neoplasm of bone: Secondary | ICD-10-CM

## 2020-11-14 DIAGNOSIS — C61 Malignant neoplasm of prostate: Secondary | ICD-10-CM

## 2020-11-14 DIAGNOSIS — C7952 Secondary malignant neoplasm of bone marrow: Secondary | ICD-10-CM

## 2020-11-14 NOTE — Progress Notes (Signed)
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 Secondary malignant neoplasm of bone and bone marrow (HCC)  C79.51    C79.52     HISTORY OF PRESENT ILLNESS: Hunter Jones is a 65 y.o. male seen at the request of Dr. Delton Coombes. He was initially diagnosed with Gleason 4+4 prostatic adenocarcinoma in 01/2014 with a PSA of 9.6. He was started on neoadjuvant ADT with Lupron. He opted to proceed with RALP performed by Dr. Alinda Money on 06/27/14. Pathology showed pT3b, pN0, Gleason 4+3 prostatic adenocarcinoma, with seminal vesicle involvement bilaterally, as well as extracapsular extension extending into the bladder neck.  Surgical margins were negative and all 7 lymph nodes sampled were benign.  His initial postoperative PSA was 0.03 in March 2016 and given his adverse pathology features and rising postoperative PSA (to 0.23 in 12/2014), he recieved adjuvant radiation therapy in late 2016 concurrent with Casodex ADT. He did not tolerate the Lupron, having experienced hot flashes and mood swings, and this was discontinued with his last dose in 2017.  He was lost to follow-up with urology for a period of time and when he resumed follow-up in May 2018, the PSA was back up to 3.68.  Unfortunately, he was found to have osseous metastases on restaging bone scan in 11/2016.  Therefore, he was restarted on Lupron on 12/06/16 under the care and direction of Dr. Irene Limbo and began [place] in 05/2017. His PSA continued to  rise, up to 3.15 in 05/2018, and a repeat bone scan on 06/12/18 showed worsening osseous metastases. He started Zytiga on 06/16/18 under the care of Dr. Delton Coombes. His PSA reached a nadir of 0.13 in 11/2018 but subsequently began to rise again. Despite the relative stability seen on surveillance bone scans, he was switched to [place] on 11/21/19 for a PSA of 1.54. This was discontinued, however, on 12/05/19 due to mood changes and intolerability. On December 10, 2019 he suffered a STEMI and required urgent cardiac catheterization with stent placement and is on chronic anticoagulation.  For this reason, he was advised not to resume the [place].  Guardant 360 was performed on 01/16/20 and showed AR amplification. He was started on Casodex on 04/10/20 due to a rising PSA of 4.47, but this was shortly discontinued on 05/12/20 due to a continued rise in his PSA up to 8.67 on 05/07/2020. His PSA continued to rise from that point, up to 12.5 in 05/2020.  Restaging CT and bone scans showed no evidence of visceral disease but increased activity in the sacrum consistent with progressive osseous metastatic disease.  He was started on docetaxel on 06/23/20 and received 6 cycles through 10/05/20. Unfortunately, his PSA has continued to rise, most recently at 44.17 on 10/31/20. His most recent bone scan from 10/23/20 showed: persistent abnormal tracer uptake throughout the sacrum but no new scintigraphic abnormalities. CT A/P on 10/31/20 showed diffuse sclerotic bone metastases throughout the spine sacrum pelvis ribs and right femoral head, without significant interval change and no evidence of soft tissue metastatic disease or  other acute findings.  He has been kindly referred to [place] today for consideration of Xofigo injections in the treatment of his osseous metastatic disease.  PREVIOUS RADIATION THERAPY: Yes  03/2015?:  Adjuvant external beam radiotherapy to the prostatic fossa in [place] Tammi Klippel)  PAST MEDICAL HISTORY:  Past Medical  History:  Diagnosis Date  . Acute ST elevation myocardial infarction (STEMI) of inferior wall ([facility]) 11/2019  . Asthma   . COPD (chronic obstructive pulmonary disease) ([place])   . Coronary artery disease    DES to mid LAD August 2014 ([facility]); overlapping DESx2 prox-mid RCA 11/2019  . Essential hypertension   . Hyperthyroidism   . NSTEMI (non-ST elevated myocardial infarction) ([place]) 2014  . Port-A-Cath in place 06/17/2020  . Prostate cancer ([facility])   . Type 2 diabetes mellitus ([facility])       PAST SURGICAL HISTORY: Past Surgical History:  Procedure Laterality Date  . APPENDECTOMY    . CORONARY/GRAFT ACUTE MI REVASCULARIZATION N/A 12/10/2019   Procedure: Coronary/Graft Acute MI Revascularization;  Surgeon: Nelva Bush, MD;  Location: [facility];  Service: Cardiovascular;  Laterality: N/A;  . IR IMAGING GUIDED PORT INSERTION  06/19/2020  . LEFT HEART CATH AND CORONARY ANGIOGRAPHY N/A 12/10/2019   Procedure: LEFT HEART CATH AND CORONARY ANGIOGRAPHY;  Surgeon: Nelva Bush, MD;  Location: [facility];  Service: Cardiovascular;  Laterality: N/A;  . LYMPHADENECTOMY Bilateral 06/27/2014   Procedure: LYMPHADENECTOMY;  Surgeon: Raynelle Bring, MD;  Location: [facility];  Service: Urology;  Laterality: Bilateral;  . ROBOT ASSISTED LAPAROSCOPIC RADICAL PROSTATECTOMY N/A 06/27/2014   Procedure: ROBOTIC ASSISTED LAPAROSCOPIC RADICAL PROSTATECTOMY LEVEL 3;  Surgeon: Raynelle Bring, MD;  Location: [facility];  Service: Urology;  Laterality: N/A;  . TONSILLECTOMY      FAMILY HISTORY:  Family History  Problem Relation Age of Onset  . Dementia Mother   . Thyroid disease Mother   . Clotting disorder Mother   . Kidney Stones Father   . Stroke Brother   . Alzheimer's disease Maternal Uncle   . Tuberculosis Paternal Grandmother   . Heart attack Maternal Uncle        x4  . Heart attack Cousin        in his 5s  . Cancer Neg Hx     SOCIAL HISTORY:  Social History   Socioeconomic History  .  Marital status: Married    Spouse name: Not on file  . Number of children: 0  . Years of education: Not on file  . Highest education level: Not on file  Occupational History  . Occupation: truck Geophysicist/field seismologist    Comment: retired  Tobacco Use  . Smoking status: Former Smoker    Packs/day: 1.00    Years: 40.00    Pack years: 40.00    Types: Cigarettes    Quit date: 06/21/2012    Years since quitting: 8.4  . Smokeless tobacco: Never Used  Vaping Use  . Vaping Use: Never used  Substance and Sexual Activity  . Alcohol use: Yes    Comment: occasional  . Drug use: No  . Sexual activity: Not Currently  Other Topics Concern  . Not on file  Social History Narrative  . Not on file   Social Determinants of Health   Financial Resource Strain: Low Risk   . Difficulty of Paying Living Expenses: Not hard at all  Food Insecurity: No Food Insecurity  . Worried About Charity fundraiser in the Last Year: Never true  .  Ran Out of Food in the Last Year: Never true  Transportation Needs: No Transportation Needs  . Lack of Transportation (Medical): No  . Lack of Transportation (Non-Medical): No  Physical Activity: Inactive  . Days of Exercise per Week: 0 days  . Minutes of Exercise per Session: 0 min  Stress: No Stress Concern Present  . Feeling of Stress : Not at all  Social Connections: Moderately Integrated  . Frequency of Communication with Friends and Family: More than three times a week  . Frequency of Social Gatherings with Friends and Family: Never  . Attends Religious Services: Never  . Active Member of Clubs or Organizations: Yes  . Attends Archivist Meetings: Never  . Marital Status: Married  Human resources officer Violence: Not At Risk  . Fear of Current or Ex-Partner: No  . Emotionally Abused: No  . Physically Abused: No  . Sexually Abused: No    ALLERGIES: Xtandi [enzalutamide] and Penicillins  MEDICATIONS:  Current Outpatient Medications  Medication Sig Dispense  Refill  . albuterol (PROVENTIL HFA;VENTOLIN HFA) 108 (90 BASE) MCG/ACT inhaler Inhale 1-2 puffs into the lungs every 6 (six) hours as needed for wheezing or shortness of breath.    . Ascorbic Acid (VITAMIN C PO) Take 1 tablet by mouth daily.    [name] Kitchen aspirin EC 81 MG tablet Take 81 mg by mouth 3 (three) times a week.    [name] Kitchen atorvastatin (LIPITOR) 40 MG tablet TAKE 1 TABLET BY MOUTH AT BEDTIME. (Patient taking differently: Take 40 mg by mouth at bedtime.) 30 tablet 6  . Calcium Carb-Cholecalciferol (HM CALCIUM-VITAMIN D) 600-800 MG-UNIT TABS Take 1 tablet by mouth 2 (two) times daily.    . Coenzyme Q10 (COQ10) 200 MG CAPS Take 200 mg by mouth daily.    [name] Kitchen denosumab (XGEVA) 120 MG/1.7ML SOLN injection Inject 120 mg into the skin every 30 (thirty) days.    . DOCETAXEL IV Inject into the vein every 21 ( twenty-one) days.    . fluticasone-salmeterol (ADVAIR HFA) 115-21 MCG/ACT inhaler Inhale 2 puffs into the lungs 2 (two) times daily.     [name] Kitchen ibuprofen (ADVIL) 200 MG tablet Take 200 mg by mouth every 6 (six) hours as needed for headache or mild pain.    . Ibuprofen-diphenhydrAMINE HCl (IBUPROFEN PM) 200-25 MG CAPS Take 1 tablet by mouth at bedtime as needed (sleep).    [name] Kitchen KRILL OIL PO Take 1 capsule by mouth daily.    [name] Kitchen Leuprolide Acetate, 3 Month, (LUPRON DEPOT, 76-MONTH, IM) Inject 1 Dose into the muscle every 3 (three) months.    . lidocaine-prilocaine (EMLA) cream Apply a small amount to port a cath site and cover with plastic wrap 1 hour prior to chemotherapy appointments 30 g 3  . Lutein 20 MG CAPS Take 20 mg by mouth daily.    . methimazole (TAPAZOLE) 10 MG tablet Take 10 mg by mouth every other day.     . metoprolol succinate (TOPROL-XL) 25 MG 24 hr tablet TAKE 1 TABLET BY MOUTH EVERY DAY (Patient taking differently: Take 25 mg by mouth daily.) 90 tablet 1  . Multiple Vitamin (MULTIVITAMIN) tablet Take 1 tablet by mouth daily.    . nitroGLYCERIN (NITROSTAT) 0.4 MG SL tablet Place 1 tablet (0.4 mg total)  under the tongue every 5 (five) minutes as needed. 25 tablet 12  . Potassium 99 MG TABS Take 99 mg by mouth daily.    . predniSONE (DELTASONE) 5 MG tablet Take 1 tablet (5 mg total) by  mouth daily with breakfast. 60 tablet 11  . prochlorperazine (COMPAZINE) 10 MG tablet Take 1 tablet (10 mg total) by mouth every 6 (six) hours as needed (Nausea or vomiting). 30 tablet 1  . Trolamine Salicylate (ASPERCREME EX) Apply 1 application topically daily as needed (pain).     No current facility-administered medications for this encounter.    REVIEW OF SYSTEMS:  On review of systems, the patient reports that he is doing well overall. He denies any chest pain, shortness of breath, cough, fevers, chills, night sweats, unintended weight changes. He denies any bowel disturbances, and denies abdominal pain, nausea or vomiting. He reports wearing Depends to bed and nocturia x3. He denies any new musculoskeletal or joint aches or pains, specifically sacral pain. He notes occasional aches and pains in his joints, managed with Aspercreme. A complete review of systems is obtained and is otherwise negative.    PHYSICAL EXAM:  Wt Readings from Last 3 Encounters:  11/14/20 259 lb (117.5 kg)  11/05/20 259 lb 11.2 oz (117.8 kg)  10/06/20 257 lb 14.4 oz (117 kg)   Temp Readings from Last 3 Encounters:  11/07/20 (!) 97.2 F (36.2 C) (Tympanic)  11/05/20 (!) 96.9 F (36.1 C) (Temporal)  10/08/20 (!) 96.9 F (36.1 C) (Temporal)   BP Readings from Last 3 Encounters:  11/07/20 (!) 132/59  11/05/20 106/60  10/08/20 (!) 114/57   Pulse Readings from Last 3 Encounters:  11/07/20 86  11/05/20 81  10/08/20 87   Pain Assessment Pain Score: 0-No pain/10  Physical exam not performed in light of telephone encounter.   KPS = 90  100 - Normal; no complaints; no evidence of disease. 90   - Able to carry on normal activity; minor signs or symptoms of disease. 80   - Normal activity with effort; some signs or  symptoms of disease. 23   - Cares for self; unable to carry on normal activity or to do active work. 60   - Requires occasional assistance, but is able to care for most of his personal needs. 50   - Requires considerable assistance and frequent medical care. 69   - Disabled; requires special care and assistance. 40   - Severely disabled; hospital admission is indicated although death not imminent. 31   - Very sick; hospital admission necessary; active supportive treatment necessary. 10   - Moribund; fatal processes progressing rapidly. 0     - Dead  Karnofsky DA, Abelmann [place], Craver LS and Burchenal JH [phone]) The use of the nitrogen mustards in the palliative treatment of carcinoma: with particular reference to bronchogenic carcinoma Cancer 1 634-56  LABORATORY DATA:  Lab Results  Component Value Date   WBC 9.6 10/31/2020   HGB 11.9 (L) 10/31/2020   HCT 36.8 (L) 10/31/2020   MCV 104.0 (H) 10/31/2020   PLT 163 10/31/2020   Lab Results  Component Value Date   NA 136 10/31/2020   K 4.7 10/31/2020   CL 105 10/31/2020   CO2 24 10/31/2020   Lab Results  Component Value Date   ALT 22 10/31/2020   AST 27 10/31/2020   ALKPHOS 85 10/31/2020   BILITOT 0.6 10/31/2020     RADIOGRAPHY: CT ABDOMEN PELVIS WO CONTRAST  Result Date: 11/02/2020 CLINICAL DATA:  Follow-up metastatic prostate carcinoma. EXAM: CT ABDOMEN AND PELVIS WITHOUT CONTRAST TECHNIQUE: Multidetector CT imaging of the abdomen and pelvis was performed following the standard protocol without IV contrast. COMPARISON:  06/02/2020 FINDINGS: Lower chest: No acute findings. Hepatobiliary:  No mass visualized on this unenhanced exam. Gallbladder is unremarkable. No evidence of biliary ductal dilatation. Pancreas: No mass or inflammatory process visualized on this unenhanced exam. Spleen:  Within normal limits in size. Adrenals/Urinary tract: No evidence of urolithiasis or hydronephrosis. Unremarkable unopacified urinary bladder.  Stomach/Bowel: No evidence of obstruction, inflammatory process, or abnormal fluid collections. Diverticulosis is seen mainly involving the sigmoid colon, however there is no evidence of diverticulitis. Vascular/Lymphatic: No pathologically enlarged lymph nodes identified. No evidence of abdominal aortic aneurysm. Aortic atherosclerotic calcification noted. Reproductive: Prior prostatectomy again noted. No soft tissue masses identified. Other:  None. Musculoskeletal: Diffuse sclerotic bone metastases throughout the spine, sacrum, pelvis, ribs, and right femoral head show no significant interval change. IMPRESSION: Diffuse sclerotic bone metastases, without significant interval change. No evidence of soft tissue metastatic disease or other acute findings. Colonic diverticulosis. No radiographic evidence of diverticulitis. Aortic Atherosclerosis (ICD10-I70.0). Electronically Signed   By: Marlaine Hind M.D.   On: 11/02/2020 18:04   NM Bone Scan Whole Body  Result Date: 10/24/2020 CLINICAL DATA:  Prostate cancer metastatic to bone EXAM: NUCLEAR MEDICINE WHOLE BODY BONE SCAN TECHNIQUE: Whole body anterior and posterior images were obtained approximately 3 hours after intravenous injection of radiopharmaceutical. RADIOPHARMACEUTICALS:  20.4 mCi Technetium-38m MDP IV COMPARISON:  08/19/2020 Radiographic correlation: None recent FINDINGS: Again identified extensive uptake throughout sacrum consistent with osseous metastasis No additional sites of abnormal osseous tracer accumulation are seen. No new scintigraphic findings identified. No uptake is seen at multiple additional sclerotic foci identified by prior cross-sectional imaging. Expected urinary tract and soft tissue distribution of tracer. IMPRESSION: Persistent abnormal tracer uptake throughout sacrum consistent with osseous metastatic disease. No new scintigraphic abnormalities. Electronically Signed   By: Lavonia Dana M.D.   On: 10/24/2020 09:06       IMPRESSION/PLAN: This visit was conducted via telephone to spare the patient unnecessary potential exposure in the healthcare setting during the current COVID-19 pandemic. 1. 65 y.o. 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 Today, we talked to the patient about the findings and workup thus far. We discussed the natural history of castrate resistant metastatic prostate cancer and general treatment, highlighting the role of Xogifo infusions in the management of osseous metastases. We focused on the details of logistics and delivery. The recommendation is to proceed with monthly infusions of Xofigo x6. We will monitor labs prior to each infusion to ensure it is safe to proceed with each treatment. We reviewed the anticipated acute and late sequelae associated with Xofigo in this setting. The patient was encouraged to ask questions that were answered to his stated satisfaction.  At the end of our conversation, the patient elects to proceed with Xofigo infusions.  He does have a port and would prefer to have his treatments through the port.  He lives in [place] so we will make arrangements for his pretreatment labs to be done at [facility].  We will share this information with Dr. Delton Coombes and proceed with treatment planning accordingly, in anticipation of beginning treatment in the near future.  We enjoyed meeting him and look forward to continuing to participate in his care.  He knows that he is welcome to call at anytime with any questions or concerns.   Given current concerns for patient exposure during the COVID-19 pandemic, this encounter was conducted via telephone. The patient was notified in advance and was offered a [place] meeting to allow  for face to face communication but unfortunately reported that he did not have the appropriate resources/technology to support such a visit and instead  preferred to proceed with telephone consult. The patient has given verbal consent for this type of encounter. The attendants for this meeting include Tyler Pita MD, Ashlyn Bruning PA-C, [place], patient Hunter Jones. During the encounter, Tyler Pita MD, Ashlyn Bruning PA-C, and scribe, Wilburn Mylar were located at [facility].  Patient Hunter Jones was located at home.  We personally spent 75 minutes in this encounter including chart review, reviewing radiological studies, meeting face-to-face with the patient, entering orders and completing documentation.    Nicholos Johns, PA-C    Tyler Pita, MD  [facility] Direct Dial: [phone]  Fax: [phone] [email or web address]  Skype  [email or web address]   This document serves as a record of services personally performed by Tyler Pita, MD and Freeman Caldron, PA-C. It was created on their behalf by Wilburn Mylar, a trained medical scribe. The creation of this record is based on the scribe's personal observations and the provider's statements to them. This document has been checked and approved by the attending provider.

## 2020-11-15 DIAGNOSIS — C7951 Secondary malignant neoplasm of bone: Secondary | ICD-10-CM | POA: Insufficient documentation

## 2020-11-15 NOTE — Progress Notes (Signed)
  Radiation Oncology         [phone] ________________________________  Name: Hunter Jones MRN: [id number]  Date: 11/14/2020  DOB: [birth year]  Xofigo Treatment Planning Note:  Diagnosis:  Castration resistant prostate cancer with painful bone involvement  Narrative: Hunter Jones is a patient who has been diagnosed with castration resistant prostate cancer with painful bone involvement.  His most recent blood counts show that he remains a good candidate to proceed with Ra-223.  The patient is going to receive Xofigo for his treatment.   Radiation Treatment Planning:  The prescribed radiation activity will be 50 kBq per kg per infusions. The plan is to offer a total of 6 IV administrations of this agent, assuming the blood counts are adequate prior to each administration, with each infusion done at 4 week intervals.  This will be done as an IV administration in the nuclear medicine department, with care to undertake all radiation protection precautions as recommended.   ________________________________  Sheral Apley. Tammi Klippel, M.D.

## 2020-11-18 ENCOUNTER — Telehealth: Payer: Self-pay | Admitting: *Deleted

## 2020-11-18 ENCOUNTER — Other Ambulatory Visit: Payer: Self-pay | Admitting: Radiation Oncology

## 2020-11-18 ENCOUNTER — Other Ambulatory Visit (HOSPITAL_COMMUNITY): Payer: Self-pay | Admitting: Radiation Oncology

## 2020-11-18 DIAGNOSIS — C7951 Secondary malignant neoplasm of bone: Secondary | ICD-10-CM

## 2020-11-18 DIAGNOSIS — C61 Malignant neoplasm of prostate: Secondary | ICD-10-CM

## 2020-11-18 NOTE — Telephone Encounter (Signed)
CALLED PATIENT TO INFORM OF LAB AND WEIGHT ON 12-02-20- REPORT TO [place] FOR THIS AND HIS XOFIGO INJ. ON 12/09/20 - ARRIVAL TIME- 11:45 AM @ [facility], SPOKE WITH PATIENT AND HE IS AWARE OF THESE APPTS.

## 2020-11-26 ENCOUNTER — Telehealth: Payer: Self-pay | Admitting: *Deleted

## 2020-11-26 NOTE — Telephone Encounter (Signed)
RETURNED PATIENT'S PHONE CALL, SPOKE WITH PATIENT. ?

## 2020-11-28 ENCOUNTER — Other Ambulatory Visit: Payer: Self-pay | Admitting: Family Medicine

## 2020-12-01 ENCOUNTER — Telehealth: Payer: Self-pay | Admitting: *Deleted

## 2020-12-01 NOTE — Telephone Encounter (Signed)
CALLED PATIENT TO REMIND OF LAB AND WEIGHT FOR 12-02-20 @ [place], SPOKE WITH PATIENT AND HE IS AWARE OF THIS APPT.

## 2020-12-02 ENCOUNTER — Other Ambulatory Visit (HOSPITAL_COMMUNITY): Payer: Self-pay | Admitting: *Deleted

## 2020-12-02 ENCOUNTER — Other Ambulatory Visit (HOSPITAL_COMMUNITY)
Admission: RE | Admit: 2020-12-02 | Discharge: 2020-12-02 | Disposition: A | Discharge status: Home / Self Care | Payer: Medicare Other | Source: Ambulatory Visit | Attending: Radiation Oncology | Admitting: Radiation Oncology

## 2020-12-02 ENCOUNTER — Inpatient Hospital Stay (HOSPITAL_COMMUNITY): Admit: 2020-12-02 | Payer: Medicare Other

## 2020-12-02 DIAGNOSIS — C61 Malignant neoplasm of prostate: Secondary | ICD-10-CM

## 2020-12-02 DIAGNOSIS — C7951 Secondary malignant neoplasm of bone: Secondary | ICD-10-CM | POA: Diagnosis present

## 2020-12-02 LAB — CBC WITH DIFFERENTIAL/PLATELET
Abs Immature Granulocytes: 0.02 10*3/uL (ref 0.00–0.07)
Basophils Absolute: 0 10*3/uL (ref 0.0–0.1)
Basophils Relative: 1 %
Eosinophils Absolute: 0.2 10*3/uL (ref 0.0–0.5)
Eosinophils Relative: 4 %
HCT: 38.2 % — ABNORMAL LOW (ref 39.0–52.0)
Hemoglobin: 12.7 g/dL — ABNORMAL LOW (ref 13.0–17.0)
Immature Granulocytes: 0 %
Lymphocytes Relative: 18 %
Lymphs Abs: 1.2 10*3/uL (ref 0.7–4.0)
MCH: 32.7 pg (ref 26.0–34.0)
MCHC: 33.2 g/dL (ref 30.0–36.0)
MCV: 98.5 fL (ref 80.0–100.0)
Monocytes Absolute: 0.4 10*3/uL (ref 0.1–1.0)
Monocytes Relative: 7 %
Neutro Abs: 4.7 10*3/uL (ref 1.7–7.7)
Neutrophils Relative %: 70 %
Platelets: 147 10*3/uL — ABNORMAL LOW (ref 150–400)
RBC: 3.88 MIL/uL — ABNORMAL LOW (ref 4.22–5.81)
RDW: 13.2 % (ref 11.5–15.5)
WBC: 6.6 10*3/uL (ref 4.0–10.5)
nRBC: 0 % (ref 0.0–0.2)

## 2020-12-08 ENCOUNTER — Telehealth: Payer: Self-pay | Admitting: *Deleted

## 2020-12-08 NOTE — Telephone Encounter (Signed)
CALLED PATIENT TO REMND OF INJ. FOR 12-09-20- ARRIVAL TIME- 11:45 AM @ [facility], SPOKE WITH PATIENT'S WIFE- ERNESTINE AND SHE IS AWARE OF THIS INJ.

## 2020-12-09 ENCOUNTER — Ambulatory Visit (HOSPITAL_COMMUNITY)
Admission: RE | Admit: 2020-12-09 | Discharge: 2020-12-09 | Disposition: A | Discharge status: Home / Self Care | Payer: Medicare Other | Source: Ambulatory Visit | Attending: Radiation Oncology | Admitting: Radiation Oncology

## 2020-12-09 ENCOUNTER — Other Ambulatory Visit: Payer: Self-pay

## 2020-12-09 DIAGNOSIS — C7951 Secondary malignant neoplasm of bone: Secondary | ICD-10-CM | POA: Diagnosis present

## 2020-12-09 DIAGNOSIS — C61 Malignant neoplasm of prostate: Secondary | ICD-10-CM | POA: Insufficient documentation

## 2020-12-09 DIAGNOSIS — C7952 Secondary malignant neoplasm of bone marrow: Secondary | ICD-10-CM | POA: Insufficient documentation

## 2020-12-09 MED ORDER — RADIUM RA 223 DICHLORIDE 30 MCCI/ML IV SOLN
173.0000 | Freq: Once | INTRAVENOUS | Status: AC | PRN
  Administered 2020-12-09: 165.4 via INTRAVENOUS

## 2020-12-09 NOTE — Progress Notes (Signed)
  Radiation Oncology         [phone] ________________________________  Name: Hunter Jones MRN: [id number]  Date: 12/09/2020  DOB: 1956/01/31  Radium-223 Infusion Note  Diagnosis:  Castration resistant prostate cancer with painful bone involvement  Current Infusion:    1  Planned Infusions:  6  Narrative: [name] presented to nuclear medicine for treatment. His most recent blood counts were reviewed.  He remains a good candidate to proceed with Ra-223.  The patient was situated in an infusion suite with a contact barrier placed under his arm. Intravenous access was established, using sterile technique, and a normal saline infusion from a syringe was started.  Micro-dosimetry:  The prescribed radiation activity was assayed and confirmed to be within specified tolerance.  Special Treatment Procedure - Infusion:  The nuclear medicine technologist and I personally verified the dose activity to be delivered as specified in the written directive, and verified the patient identification via 2 separate methods.  The syringe containing the dose was attached to an intravenous access and the dose delivered over a minute. No complications were noted.  The total administered dose was 170.4 microcuries.   A saline flush of the line and the syringe that contained the isotope was then performed.  The residual radioactivity in the syringe was 5.0 microcuries, so the actual infused isotope activity was 165.4 microcuries.   Pressure was applied to the venipuncture site, and a compression bandage placed.   Radiation Safety personnel were present to perform the discharge survey, as detailed on their documentation.   After a short period of observation, the patient had his IV removed.  Impression:  The patient tolerated his infusion relatively well.  Plan:  The patient will return in one month for ongoing care.    ________________________________  Sheral Apley. Tammi Klippel, M.D.

## 2020-12-10 ENCOUNTER — Telehealth: Payer: Self-pay | Admitting: *Deleted

## 2020-12-10 ENCOUNTER — Other Ambulatory Visit (HOSPITAL_COMMUNITY): Payer: Self-pay | Admitting: Radiation Oncology

## 2020-12-10 DIAGNOSIS — C61 Malignant neoplasm of prostate: Secondary | ICD-10-CM

## 2020-12-10 NOTE — Telephone Encounter (Signed)
CALLED PATIENT TO INFORM OF LAB AND WEIGHT FOR 01-02-21 @ [place]. ON 01-09-21 - ARRIVAL TIME- 11:45 AM @ [facility], SPOKE WITH PATIENT AND HE IS AWARE OF THESE APPTS.

## 2020-12-26 ENCOUNTER — Telehealth: Payer: Self-pay | Admitting: *Deleted

## 2020-12-26 NOTE — Telephone Encounter (Signed)
Called patient to inform that it would be ok to get his labs done on 01-01-21 @ [facility], per Dr. Tammi Klippel, spoke with patient and he is aware of this.

## 2020-12-30 ENCOUNTER — Other Ambulatory Visit: Payer: Self-pay | Admitting: Emergency Medicine

## 2020-12-31 ENCOUNTER — Telehealth: Payer: Self-pay | Admitting: *Deleted

## 2020-12-31 NOTE — Telephone Encounter (Signed)
CALLED PATIENT TO REMIND OF LAB AND WEIGHT ON 01-01-21, SPOKE WITH PATIENT AND HE IS AWARE OF THESE APPTS. (APPTS.) WILL BE AT [facility], SPOKE WITH PATIENT AND HE VERIFIED UNDERSTANDING THIESE APPTS.

## 2021-01-01 ENCOUNTER — Other Ambulatory Visit (HOSPITAL_COMMUNITY)
Admission: RE | Admit: 2021-01-01 | Discharge: 2021-01-01 | Disposition: A | Discharge status: Home / Self Care | Payer: Medicare Other | Source: Ambulatory Visit | Attending: Radiation Oncology | Admitting: Radiation Oncology

## 2021-01-01 DIAGNOSIS — G4710 Hypersomnia, unspecified: Secondary | ICD-10-CM | POA: Insufficient documentation

## 2021-01-01 DIAGNOSIS — R0683 Snoring: Secondary | ICD-10-CM | POA: Diagnosis present

## 2021-01-01 LAB — CBC WITH DIFFERENTIAL/PLATELET
Abs Immature Granulocytes: 0.03 10*3/uL (ref 0.00–0.07)
Basophils Absolute: 0 10*3/uL (ref 0.0–0.1)
Basophils Relative: 0 %
Eosinophils Absolute: 0.2 10*3/uL (ref 0.0–0.5)
Eosinophils Relative: 3 %
HCT: 39 % (ref 39.0–52.0)
Hemoglobin: 13.4 g/dL (ref 13.0–17.0)
Immature Granulocytes: 0 %
Lymphocytes Relative: 19 %
Lymphs Abs: 1.3 10*3/uL (ref 0.7–4.0)
MCH: 33 pg (ref 26.0–34.0)
MCHC: 34.4 g/dL (ref 30.0–36.0)
MCV: 96.1 fL (ref 80.0–100.0)
Monocytes Absolute: 0.5 10*3/uL (ref 0.1–1.0)
Monocytes Relative: 8 %
Neutro Abs: 4.7 10*3/uL (ref 1.7–7.7)
Neutrophils Relative %: 70 %
Platelets: 151 10*3/uL (ref 150–400)
RBC: 4.06 MIL/uL — ABNORMAL LOW (ref 4.22–5.81)
RDW: 13.2 % (ref 11.5–15.5)
WBC: 6.7 10*3/uL (ref 4.0–10.5)
nRBC: 0 % (ref 0.0–0.2)

## 2021-01-08 ENCOUNTER — Telehealth: Payer: Self-pay | Admitting: *Deleted

## 2021-01-08 NOTE — Telephone Encounter (Signed)
CALLED PATIENT TO REMIND OF XOFIGO INJ. FOR 01-09-21 - ARRIVAL TIME- 11:45 AM @ [facility], SPOKE WITH PATIENT'S WIFE- ERNESTINE AND SHE IS AWARE OF THIS APPT.

## 2021-01-09 ENCOUNTER — Other Ambulatory Visit: Payer: Self-pay

## 2021-01-09 ENCOUNTER — Ambulatory Visit (HOSPITAL_COMMUNITY)
Admission: RE | Admit: 2021-01-09 | Discharge: 2021-01-09 | Disposition: A | Discharge status: Home / Self Care | Payer: Medicare Other | Source: Ambulatory Visit | Attending: Radiation Oncology | Admitting: Radiation Oncology

## 2021-01-09 DIAGNOSIS — C61 Malignant neoplasm of prostate: Secondary | ICD-10-CM | POA: Diagnosis not present

## 2021-01-09 DIAGNOSIS — C7951 Secondary malignant neoplasm of bone: Secondary | ICD-10-CM

## 2021-01-09 MED ORDER — RADIUM RA 223 DICHLORIDE 30 MCCI/ML IV SOLN
177.2200 | Freq: Once | INTRAVENOUS | Status: AC
  Administered 2021-01-09: 177.22 via INTRAVENOUS

## 2021-01-13 ENCOUNTER — Other Ambulatory Visit: Payer: Self-pay

## 2021-01-13 ENCOUNTER — Inpatient Hospital Stay (HOSPITAL_COMMUNITY): Payer: Medicare Other | Attending: Hematology

## 2021-01-13 DIAGNOSIS — Z79899 Other long term (current) drug therapy: Secondary | ICD-10-CM | POA: Diagnosis not present

## 2021-01-13 DIAGNOSIS — C61 Malignant neoplasm of prostate: Secondary | ICD-10-CM | POA: Insufficient documentation

## 2021-01-13 DIAGNOSIS — C7951 Secondary malignant neoplasm of bone: Secondary | ICD-10-CM | POA: Diagnosis present

## 2021-01-13 DIAGNOSIS — Z5111 Encounter for antineoplastic chemotherapy: Secondary | ICD-10-CM | POA: Diagnosis present

## 2021-01-13 LAB — CBC WITH DIFFERENTIAL/PLATELET
Abs Immature Granulocytes: 0.04 10*3/uL (ref 0.00–0.07)
Basophils Absolute: 0 10*3/uL (ref 0.0–0.1)
Basophils Relative: 1 %
Eosinophils Absolute: 0.2 10*3/uL (ref 0.0–0.5)
Eosinophils Relative: 3 %
HCT: 37.3 % — ABNORMAL LOW (ref 39.0–52.0)
Hemoglobin: 12.7 g/dL — ABNORMAL LOW (ref 13.0–17.0)
Immature Granulocytes: 1 %
Lymphocytes Relative: 16 %
Lymphs Abs: 1 10*3/uL (ref 0.7–4.0)
MCH: 32.3 pg (ref 26.0–34.0)
MCHC: 34 g/dL (ref 30.0–36.0)
MCV: 94.9 fL (ref 80.0–100.0)
Monocytes Absolute: 0.5 10*3/uL (ref 0.1–1.0)
Monocytes Relative: 8 %
Neutro Abs: 4.8 10*3/uL (ref 1.7–7.7)
Neutrophils Relative %: 71 %
Platelets: 144 10*3/uL — ABNORMAL LOW (ref 150–400)
RBC: 3.93 MIL/uL — ABNORMAL LOW (ref 4.22–5.81)
RDW: 13.2 % (ref 11.5–15.5)
WBC: 6.6 10*3/uL (ref 4.0–10.5)
nRBC: 0 % (ref 0.0–0.2)

## 2021-01-13 LAB — COMPREHENSIVE METABOLIC PANEL
ALT: 24 U/L (ref 0–44)
AST: 27 U/L (ref 15–41)
Albumin: 3.7 g/dL (ref 3.5–5.0)
Alkaline Phosphatase: 146 U/L — ABNORMAL HIGH (ref 38–126)
Anion gap: 6 (ref 5–15)
BUN: 33 mg/dL — ABNORMAL HIGH (ref 8–23)
CO2: 25 mmol/L (ref 22–32)
Calcium: 9.5 mg/dL (ref 8.9–10.3)
Chloride: 105 mmol/L (ref 98–111)
Creatinine, Ser: 1.26 mg/dL — ABNORMAL HIGH (ref 0.61–1.24)
GFR, Estimated: >60 mL/min (ref >60)
Glucose, Bld: 120 mg/dL — ABNORMAL HIGH (ref 70–99)
Potassium: 4.6 mmol/L (ref 3.5–5.1)
Sodium: 136 mmol/L (ref 135–145)
Total Bilirubin: 0.8 mg/dL (ref 0.3–1.2)
Total Protein: 6.4 g/dL — ABNORMAL LOW (ref 6.5–8.1)

## 2021-01-13 LAB — MAGNESIUM: Magnesium: 1.7 mg/dL (ref 1.7–2.4)

## 2021-01-13 LAB — PSA: Prostatic Specific Antigen: 230 ng/mL — ABNORMAL HIGH (ref 0.00–4.00)

## 2021-01-13 MED ORDER — HEPARIN SOD (PORK) LOCK FLUSH 100 UNIT/ML IV SOLN
500.0000 [IU] | Freq: Once | INTRAVENOUS | Status: AC
  Administered 2021-01-13: 500 [IU] via INTRAVENOUS

## 2021-01-13 MED ORDER — SODIUM CHLORIDE 0.9% FLUSH
10.0000 mL | Freq: Once | INTRAVENOUS | Status: AC
  Administered 2021-01-13: 10 mL via INTRAVENOUS

## 2021-01-13 NOTE — Progress Notes (Signed)
Hunter Jones presented for Portacath access and flush.  Portacath located right chest wall accessed with  H 20 needle.  Good blood return present. Portacath flushed with 35m NS and 500U/569mHeparin and needle removed intact.  Procedure tolerated well and without incident.  Discharge from clinic ambulatory in stable condition.  Alert and oriented X 3.  [facility].

## 2021-01-13 NOTE — Patient Instructions (Signed)
[facility]  Discharge Instructions: °Thank you for choosing [facility] to provide your oncology and hematology care.  °If you have a lab appointment with the Cancer Center, please come in thru the Main Entrance and check in at the main information desk. ° °Wear comfortable clothing and clothing appropriate for easy access to any Portacath or PICC line.  ° °We strive to give you quality time with your provider. You may need to reschedule your appointment if you arrive late (15 or more minutes).  Arriving late affects you and other patients whose appointments are after yours.  Also, if you miss three or more appointments without notifying the office, you may be dismissed from the clinic at the provider’s discretion.    °  °For prescription refill requests, have your pharmacy contact our office and allow 72 hours for refills to be completed.   ° °Today you received the following chemotherapy and/or immunotherapy agents PORT flush    °  °To help prevent nausea and vomiting after your treatment, we encourage you to take your nausea medication as directed. ° °BELOW ARE SYMPTOMS THAT SHOULD BE REPORTED IMMEDIATELY: °*FEVER GREATER THAN 100.4 F (38 °C) OR HIGHER °*CHILLS OR SWEATING °*NAUSEA AND VOMITING THAT IS NOT CONTROLLED WITH YOUR NAUSEA MEDICATION °*UNUSUAL SHORTNESS OF BREATH °*UNUSUAL BRUISING OR BLEEDING °*URINARY PROBLEMS (pain or burning when urinating, or frequent urination) °*BOWEL PROBLEMS (unusual diarrhea, constipation, pain near the anus) °TENDERNESS IN MOUTH AND THROAT WITH OR WITHOUT PRESENCE OF ULCERS (sore throat, sores in mouth, or a toothache) °UNUSUAL RASH, SWELLING OR PAIN  °UNUSUAL VAGINAL DISCHARGE OR ITCHING  ° °Items with * indicate a potential emergency and should be followed up as soon as possible or go to the Emergency Department if any problems should occur. ° °Please show the CHEMOTHERAPY ALERT CARD or IMMUNOTHERAPY ALERT CARD at check-in to the Emergency  Department and triage nurse. ° °Should you have questions after your visit or need to cancel or reschedule your appointment, please contact  [facility] [phone]  and follow the prompts.  Office hours are 8:00 a.m. to 4:30 p.m. Monday - Friday. Please note that voicemails left after 4:00 p.m. may not be returned until the following business day.  We are closed weekends and major holidays. You have access to a nurse at all times for urgent questions. Please call the main number to the clinic [phone] and follow the prompts. ° °For any non-urgent questions, you may also contact your provider using MyChart. We now offer e-Visits for anyone 18 and older to request care online for non-urgent symptoms. For details visit [email or web address]. °  °Also download the MyChart app! Go to the app store, search "MyChart", open the app, select [facility], and log in with your MyChart username and password. ° °Due to Covid, a mask is required upon entering the hospital/clinic. If you do not have a mask, one will be given to you upon arrival. For doctor visits, patients may have 1 support person aged 18 or older with them. For treatment visits, patients cannot have anyone with them due to current Covid guidelines and our immunocompromised population.  °

## 2021-01-15 ENCOUNTER — Telehealth: Payer: Self-pay | Admitting: *Deleted

## 2021-01-15 ENCOUNTER — Other Ambulatory Visit (HOSPITAL_COMMUNITY): Payer: Self-pay | Admitting: Radiation Oncology

## 2021-01-15 DIAGNOSIS — C61 Malignant neoplasm of prostate: Secondary | ICD-10-CM

## 2021-01-15 NOTE — Telephone Encounter (Signed)
CALLED PATIENT TO INFORM OF LAB AND WEIGHT  ON 02-03-21 , AND HIS XOFIGO INJ. ON 02-10-21- ARRIVAL TIME- 11:15 AM @ [facility], SPOKE WITH PATIENT AND HE IS AWARE OF THESE APPTS.

## 2021-01-20 NOTE — Progress Notes (Signed)
[facility] 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 Radical resection of prostate on 06/27/2014. 2. Zytiga from 05/19/2018 to 11/09/2019. 3. Xtandi from 11/21/2019 to 12/05/2019, stopped due to mood changes  NGS Results:  Guardant AR amplification, MSI normal, TMB 16 Muts/Mb  CURRENT THERAPY: Docetaxel every 3 weeks; Xgeva monthly; Lupron every 3 months  BRIEF ONCOLOGIC HISTORY:  Oncology History  Prostate cancer (HCC)  02/06/2014 Procedure   Prostate biopsy by Dr. Wendall Stade    02/11/2014 Pathology Results   Prostatic adenocarcinoma identified in 5 of 6 prostate specimens with Gleason pattern showing primary pattern-grade 4, secondary pattern grade 3.  Total Gleason score equals 7.  Proportion of prostatic tissue involved by tumor: 70%.    05/03/2014 Imaging   Bone scan- Negative     06/27/2014 Procedure   Radical resection of prostate by Dr. Heloise Purpura    07/29/2014 Pathology Results   1. Prostate, radical resection - PROSTATIC ADENOCARCINOMA, GLEASON SCORE 4 + 3 = 7. - RIGHT AND LEFT PROSTATE INVOLVED. - RIGHT AND LEFT SEMINAL VESICLES INVOLVED BY TUMOR. - EXTRACAPSULAR EXTENSION BY TUMOR. - TUMOR EXTENDS INTO BLADDER NECK TISSUE. - MARGINS NOT INVOLVED. 2. Lymph nodes, regional resection, left pelvic - THREE BENIGN LYMPH NODES (0/3). 3. Lymph nodes, regional resection, right pelvic - FOUR BENIGN LYMPH NODES (0/4).    11/16/2016 Imaging   Bone scan- New areas of increased uptake superolateral to the left orbit (faint), at S1, and in the posterolateral aspect of the left sixth rib.     11/25/2016 Imaging   CT CAP- 1. Status post radical prostatectomy. No findings to suggest local recurrence of disease or definite extraskeletal metastatic  disease in the chest, abdomen or pelvis. However, there are several osseous lesions, as above, concerning for metastatic disease to the bones. 2. Hepatic steatosis. 3. Aortic atherosclerosis, in addition to 2 vessel coronary artery disease. Please note that although the presence of coronary artery calcium documents the presence of coronary artery disease, the severity of this disease and any potential stenosis cannot be assessed on this non-gated CT examination. Assessment for potential risk factor modification, dietary therapy or pharmacologic therapy may be warranted, if clinically indicated. 4. Additional incidental findings, as above.     Genetic Testing   Guardant 360 Results:         06/23/2020 -  Chemotherapy    Patient is on Treatment Plan: PROSTATE DOCETAXEL + PREDNISONE Q21D         CANCER STAGING: Cancer Staging Prostate cancer [facility]) Staging form: Prostate, AJCC 7th Edition - Pathologic stage from 07/29/2014: Stage III (T3b, N0, cM0, Gleason 7) - Signed by Ellouise Newer, PA-C on 11/09/2016 - Clinical: Stage IV (T3, N0, M1, PSA: Less than 10, Gleason 7) - Signed by Ralene Cork, MD on 12/21/2016 - Pathologic: No stage assigned - Unsigned   INTERVAL HISTORY:  Hunter Jones, a 65 y.o. 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 Hunter Jones was last seen on 11/05/20.   Today he reports feeling good. He denies any severe fatigue, n/v/d, new pains, dental issues.   REVIEW OF SYSTEMS:  Review of Systems  Constitutional:  Negative for appetite change and fatigue.  Gastrointestinal:  Negative for diarrhea, nausea and  vomiting.  Musculoskeletal:  Positive for back pain (6/10).  All other systems reviewed and are negative.  PAST MEDICAL/SURGICAL HISTORY:  Past Medical History:  Diagnosis Date   Acute ST elevation myocardial infarction (STEMI) of inferior wall ([place]) 11/2019   Asthma    COPD (chronic obstructive pulmonary  disease) (HCC)    Coronary artery disease    DES to mid LAD August 2014 ([facility]); overlapping DESx2 prox-mid RCA 11/2019   Essential hypertension    Hyperthyroidism    NSTEMI (non-ST elevated myocardial infarction) ([place]) 2014   Port-A-Cath in place 06/17/2020   Prostate cancer ([place])    Type 2 diabetes mellitus ([place])    Past Surgical History:  Procedure Laterality Date   APPENDECTOMY     CORONARY/GRAFT ACUTE MI REVASCULARIZATION N/A 12/10/2019   Procedure: Coronary/Graft Acute MI Revascularization;  Surgeon: Nelva Bush, MD;  Location: [facility];  Service: Cardiovascular;  Laterality: N/A;   IR IMAGING GUIDED PORT INSERTION  06/19/2020   LEFT HEART CATH AND CORONARY ANGIOGRAPHY N/A 12/10/2019   Procedure: LEFT HEART CATH AND CORONARY ANGIOGRAPHY;  Surgeon: Nelva Bush, MD;  Location: [facility];  Service: Cardiovascular;  Laterality: N/A;   LYMPHADENECTOMY Bilateral 06/27/2014   Procedure: LYMPHADENECTOMY;  Surgeon: Raynelle Bring, MD;  Location: [facility];  Service: Urology;  Laterality: Bilateral;   ROBOT ASSISTED LAPAROSCOPIC RADICAL PROSTATECTOMY N/A 06/27/2014   Procedure: ROBOTIC ASSISTED LAPAROSCOPIC RADICAL PROSTATECTOMY LEVEL 3;  Surgeon: Raynelle Bring, MD;  Location: [facility];  Service: Urology;  Laterality: N/A;   TONSILLECTOMY      SOCIAL HISTORY:  Social History   Socioeconomic History   Marital status: Married    Spouse name: Not on file   Number of children: 0   Years of education: Not on file   Highest education level: Not on file  Occupational History   Occupation: truck driver    Comment: retired  Tobacco Use   Smoking status: Former    Packs/day: 1.00    Years: 40.00    Pack years: 40.00    Types: Cigarettes    Quit date: 06/21/2012    Years since quitting: 8.5   Smokeless tobacco: Never  Vaping Use   Vaping Use: Never used  Substance and Sexual Activity   Alcohol use: Yes    Comment: occasional   Drug use: No   Sexual activity: Not  Currently  Other Topics Concern   Not on file  Social History Narrative   Not on file   Social Determinants of Health   Financial Resource Strain: Low Risk    Difficulty of Paying Living Expenses: Not hard at all  Food Insecurity: No Food Insecurity   Worried About Charity fundraiser in the Last Year: Never true   [place] in the Last Year: Never true  Transportation Needs: No Transportation Needs   Lack of Transportation (Medical): No   Lack of Transportation (Non-Medical): No  Physical Activity: Inactive   Days of Exercise per Week: 0 days   Minutes of Exercise per Session: 0 min  Stress: No Stress Concern Present   Feeling of Stress : Not at all  Social Connections: Moderately Integrated   Frequency of Communication with Friends and Family: More than three times a week   Frequency of Social Gatherings with Friends and Family: Never   Attends Religious Services: Never   Marine scientist or Organizations: Yes   Attends Archivist Meetings: Never   Marital Status:  Married  Human resources officer Violence: Not At Risk   Fear of Current or Ex-Partner: No   Emotionally Abused: No   Physically Abused: No   Sexually Abused: No    FAMILY HISTORY:  Family History  Problem Relation Age of Onset   Dementia Mother    Thyroid disease Mother    Clotting disorder Mother    Kidney Stones Father    Stroke Brother    Alzheimer's disease Maternal Uncle    Tuberculosis Paternal Grandmother    Heart attack Maternal Uncle        x4   Heart attack Cousin        in his 41s   Cancer Neg Hx     CURRENT MEDICATIONS:  Current Outpatient Medications  Medication Sig Dispense Refill   albuterol (PROVENTIL HFA;VENTOLIN HFA) 108 (90 BASE) MCG/ACT inhaler Inhale 1-2 puffs into the lungs every 6 (six) hours as needed for wheezing or shortness of breath.     Ascorbic Acid (VITAMIN C PO) Take 1 tablet by mouth daily.     aspirin EC 81 MG tablet Take 81 mg by mouth 3 (three)  times a week.     atorvastatin (LIPITOR) 40 MG tablet TAKE 1 TABLET BY MOUTH AT BEDTIME. (Patient taking differently: Take 40 mg by mouth at bedtime.) 30 tablet 6   Calcium Carb-Cholecalciferol (HM CALCIUM-VITAMIN D) 600-800 MG-UNIT TABS Take 1 tablet by mouth 2 (two) times daily. 1800 mg per day     Coenzyme Q10 (COQ10) 200 MG CAPS Take 200 mg by mouth daily.     denosumab (XGEVA) 120 MG/1.7ML SOLN injection Inject 120 mg into the skin every 30 (thirty) days. Last received 11/07/20     fluticasone-salmeterol (ADVAIR HFA) 115-21 MCG/ACT inhaler Inhale 2 puffs into the lungs 2 (two) times daily.      Ibuprofen-diphenhydrAMINE HCl (IBUPROFEN PM) 200-25 MG CAPS Take 1 tablet by mouth at bedtime as needed (sleep).     KRILL OIL PO Take 1 capsule by mouth daily.     Leuprolide Acetate, 3 Month, (LUPRON DEPOT, 3-MONTH, IM) Inject 1 Dose into the muscle every 3 (three) months.     lidocaine-prilocaine (EMLA) cream Apply a small amount to port a cath site and cover with plastic wrap 1 hour prior to chemotherapy appointments 30 g 3   Lutein 20 MG CAPS Take 20 mg by mouth daily.     methimazole (TAPAZOLE) 10 MG tablet Take 10 mg by mouth every other day.      metoprolol succinate (TOPROL-XL) 25 MG 24 hr tablet TAKE 1 TABLET BY MOUTH EVERY DAY 90 tablet 3   Multiple Vitamin (MULTIVITAMIN) tablet Take 1 tablet by mouth daily.     nitroGLYCERIN (NITROSTAT) 0.4 MG SL tablet Place 1 tablet (0.4 mg total) under the tongue every 5 (five) minutes as needed. 25 tablet 12   Potassium 99 MG TABS Take 99 mg by mouth daily.     predniSONE (DELTASONE) 5 MG tablet Take 1 tablet (5 mg total) by mouth daily with breakfast. 60 tablet 11   Trolamine Salicylate (ASPERCREME EX) Apply 1 application topically daily as needed (pain).     No current facility-administered medications for this visit.    ALLERGIES:  Allergies  Allergen Reactions   Xtandi [Enzalutamide] Other (See Comments)    "Severe personality change" and  blood clots   Penicillins Hives    PHYSICAL EXAM:  Performance status (ECOG): 1 - Symptomatic but completely ambulatory  There were no vitals filed  for this visit. Wt Readings from Last 3 Encounters:  11/14/20 259 lb (117.5 kg)  11/05/20 259 lb 11.2 oz (117.8 kg)  10/06/20 257 lb 14.4 oz (117 kg)   Physical Exam Vitals reviewed.  Constitutional:      Appearance: Normal appearance.  Cardiovascular:     Rate and Rhythm: Normal rate and regular rhythm.     Pulses: Normal pulses.     Heart sounds: Normal heart sounds.  Pulmonary:     Effort: Pulmonary effort is normal.     Breath sounds: Normal breath sounds.  Neurological:     General: No focal deficit present.     Mental Status: He is alert and oriented to person, place, and time.  Psychiatric:        Mood and Affect: Mood normal.        Behavior: Behavior normal.     LABORATORY DATA:  I have reviewed the labs as listed.  CBC Latest Ref Rng & Units 01/13/2021 01/01/2021 12/02/2020  WBC 4.0 - 10.5 K/uL 6.6 6.7 6.6  Hemoglobin 13.0 - 17.0 g/dL 12.7(L) 13.4 12.7(L)  Hematocrit 39.0 - 52.0 % 37.3(L) 39.0 38.2(L)  Platelets 150 - 400 K/uL 144(L) 151 147(L)   CMP Latest Ref Rng & Units 01/13/2021 10/31/2020 10/06/2020  Glucose 70 - 99 mg/dL 120(H) 116(H) 134(H)  BUN 8 - 23 mg/dL 33(H) 28(H) 30(H)  Creatinine 0.61 - 1.24 mg/dL 1.26(H) 1.18 1.07  Sodium 135 - 145 mmol/L 136 136 139  Potassium 3.5 - 5.1 mmol/L 4.6 4.7 4.5  Chloride 98 - 111 mmol/L 105 105 109  CO2 22 - 32 mmol/L $RemoveB'25 24 24  'WAhOSDgz$ Calcium 8.9 - 10.3 mg/dL 9.5 9.1 8.5(L)  Total Protein 6.5 - 8.1 g/dL 6.4(L) 6.2(L) 5.7(L)  Total Bilirubin 0.3 - 1.2 mg/dL 0.8 0.6 0.6  Alkaline Phos 38 - 126 U/L 146(H) 85 72  AST 15 - 41 U/L $Remo'27 27 25  'tJoRF$ ALT 0 - 44 U/L $Remo'24 22 23    'vlnas$ DIAGNOSTIC IMAGING:  I have independently reviewed the scans and discussed with the patient. NM XOFIGO INJECTION  Result Date: 01/09/2021  Trudi Ida was injected intravenously in Nuclear Medicine under the supervision  of the attending radiologist     ASSESSMENT:  1.  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 -Abiraterone and prednisone from 06/16/2018 through 11/06/2019 with progression. -Xtandi started around 11/21/2019, discontinued on 12/05/2019 due to mood changes. -ST elevation MI involving the right coronary artery on 12/10/2019, status post cardiac catheterization showing thrombotic occlusion of the proximal RCA, successful PCI with stent placement. -[employer]ae testing was negative for germline mutations. -Guardant 360 showed AR amplification.  No other mutations. -Bicalutamide 50 mg daily from 04/02/2020 through 05/12/2020 with progression. -Bone scan on 06/02/2020 with increased activity throughout the sacrum with no other bony abnormalities. -CT CAP from 06/02/2020 with no visceral metastatic disease. -Alford [place] is not compatible with Brilinta. -6 cycles of docetaxel from 06/23/2020 through 10/05/2020.   PLAN:  1.  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  He received second injection on 01/09/2021. - He reported some mild fatigue which is manageable. - I have reviewed his CBC today which shows normal white count and mildly low platelet count of 144.  Creatinine was slightly elevated at 1.26. - Overall he is tolerating radium-223 very well.  His PSA was high at 230.  I have told him to disregard PSA as radium-223  does not affect the PSA levels.  We will plan to repeat bone scan after completion of radium-223. - He has mild pain in the coccygeal region for which he takes NSAID.  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 - He will come back on 01/30/2021.  I will switch to Eligard 45 mg every 24 weeks. - RTC 3 months with repeat labs.   2.  Bone metastasis: - His calcium level today is 9.5.  He will continue monthly denosumab. - He will continue calcium and vitamin D supplements daily.    3.  Elevated blood sugars: - Jardiance was discontinued as his most recent hemoglobin A1c was around 5.   4.  CAD and stents: - Continue Plavix.  He is continuing rehab.   Orders placed this encounter:  No orders of the defined types were placed in this encounter.    Derek Jack, MD [place] [phone]   I, Thana Ates, am acting as a scribe for Dr. Derek Jack.  I, Derek Jack MD, have reviewed the above documentation for accuracy and completeness, and I agree with the above.

## 2021-01-21 ENCOUNTER — Inpatient Hospital Stay (HOSPITAL_COMMUNITY): Payer: Medicare Other

## 2021-01-21 ENCOUNTER — Inpatient Hospital Stay (HOSPITAL_BASED_OUTPATIENT_CLINIC_OR_DEPARTMENT_OTHER): Payer: Medicare Other | Admitting: Hematology

## 2021-01-21 ENCOUNTER — Encounter (HOSPITAL_COMMUNITY): Payer: Self-pay | Admitting: Hematology

## 2021-01-21 ENCOUNTER — Other Ambulatory Visit: Payer: Self-pay

## 2021-01-21 DIAGNOSIS — Z5111 Encounter for antineoplastic chemotherapy: Secondary | ICD-10-CM | POA: Diagnosis not present

## 2021-01-21 DIAGNOSIS — C61 Malignant neoplasm of prostate: Secondary | ICD-10-CM | POA: Diagnosis not present

## 2021-01-21 DIAGNOSIS — C7951 Secondary malignant neoplasm of bone: Secondary | ICD-10-CM | POA: Diagnosis not present

## 2021-01-21 MED ORDER — DENOSUMAB 120 MG/1.7ML ~~LOC~~ SOLN
SUBCUTANEOUS | Status: AC
  Filled 2021-01-21: qty 1.7

## 2021-01-21 MED ORDER — DENOSUMAB 120 MG/1.7ML ~~LOC~~ SOLN
120.0000 mg | Freq: Once | SUBCUTANEOUS | Status: AC
  Administered 2021-01-21: 120 mg via SUBCUTANEOUS

## 2021-01-21 NOTE — Patient Instructions (Signed)
Xgeva 120 mg given in right upper arm.  Site clean, dry and intact.  Tolerated well without complications.  Discharged to home ambulatory in stable condition.

## 2021-01-21 NOTE — Patient Instructions (Signed)
[place] at [facility] Discharge Instructions  You were seen today by Dr. Delton Coombes. He went over your recent results, and you received your treatment. You will be scheduled to receive your Lupron injection next week. Dr. Delton Coombes will see you back in 3 months for labs and follow up.   Thank you for choosing [place] at [facility] to provide your oncology and hematology care.  To afford each patient quality time with our provider, please arrive at least 15 minutes before your scheduled appointment time.   If you have a lab appointment with the [facility] please come in thru the Main Entrance and check in at the main information desk  You need to re-schedule your appointment should you arrive 10 or more minutes late.  We strive to give you quality time with our providers, and arriving late affects you and other patients whose appointments are after yours.  Also, if you no show three or more times for appointments you may be dismissed from the clinic at the providers discretion.     Again, thank you for choosing [facility].  Our hope is that these requests will decrease the amount of time that you wait before being seen by our physicians.       _____________________________________________________________  Should you have questions after your visit to [facility], please contact our office at [phone] between the hours of 8:00 a.m. and 4:30 p.m.  Voicemails left after 4:00 p.m. will not be returned until the following business day.  For prescription refill requests, have your pharmacy contact our office and allow 72 hours.    Cancer Center Support Programs:   > Cancer Support Group  2nd Tuesday of the month 1pm-2pm, Journey Room

## 2021-01-21 NOTE — Progress Notes (Signed)
Xgeva 120 mg given SQ in right upper arm.  Site clean, dry and intact.  Tolerated well and was discharged ambulatory to home in stable condition.

## 2021-01-23 ENCOUNTER — Ambulatory Visit: Payer: Medicare Other | Admitting: Urology

## 2021-01-26 ENCOUNTER — Encounter: Payer: Self-pay | Admitting: Urology

## 2021-01-26 ENCOUNTER — Ambulatory Visit (INDEPENDENT_AMBULATORY_CARE_PROVIDER_SITE_OTHER): Payer: Medicare Other | Admitting: Urology

## 2021-01-26 ENCOUNTER — Other Ambulatory Visit: Payer: Self-pay

## 2021-01-26 VITALS — BP 97/59 | HR 82

## 2021-01-26 DIAGNOSIS — C61 Malignant neoplasm of prostate: Secondary | ICD-10-CM

## 2021-01-26 DIAGNOSIS — R350 Frequency of micturition: Secondary | ICD-10-CM | POA: Diagnosis not present

## 2021-01-26 DIAGNOSIS — I2510 Atherosclerotic heart disease of native coronary artery without angina pectoris: Secondary | ICD-10-CM

## 2021-01-26 LAB — URINALYSIS, ROUTINE W REFLEX MICROSCOPIC
Bilirubin, UA: NEGATIVE
Glucose, UA: NEGATIVE
Ketones, UA: NEGATIVE
Leukocytes,UA: NEGATIVE
Nitrite, UA: NEGATIVE
Protein,UA: NEGATIVE
RBC, UA: NEGATIVE
Specific Gravity, UA: 1.025 (ref 1.005–1.030)
Urobilinogen, Ur: 0.2 mg/dL (ref 0.2–1.0)
pH, UA: 5 (ref 5.0–7.5)

## 2021-01-26 NOTE — Progress Notes (Signed)
01/26/2021 1:14 PM   Hunter Jones 04-04-1956 [id number]  Referring provider: Monico Blitz, MD [address] [place],  [place] [address]  CC: weak stream   HPI:  1) LUTS - pt c/o weak stream day and night - maybe worse at night. No gross hematuria. Sitting to void. PVR today 0 and UA is clear. His 08/22 Cr was 1.26. He has some frequency and urgency. AUASS = 30. No incontinence. Drinks a lot of water.   2) PCa - metastatic prostate ca under management with Dr. Delton Coombes.  PRIOR THERAPY:  1. Radical resection of prostate on 06/27/2014 - Dr. Alinda Money  2. Zytiga from 05/19/2018 to 11/09/2019. 3. Xtandi from 11/21/2019 to 12/05/2019, stopped due to mood changes 4. Docetaxel from 06/23/2020 through 10/05/2020.   CURRENT THERAPY: Xgeva monthly; Lupron every 3 months; Xofigo injections  STAGING: 05/22 CT A/P - bone mets; no LAD 05/22 bone scan - sacral bone mets   PSA jumped up to 230.   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    PMH: Past Medical History:  Diagnosis Date   Acute ST elevation myocardial infarction (STEMI) of inferior wall ([place]) 11/2019   Asthma    COPD (chronic obstructive pulmonary disease) (HCC)    Coronary artery disease    DES to mid LAD August 2014 ([facility]); overlapping DESx2 prox-mid RCA 11/2019   Essential hypertension    Hyperthyroidism    NSTEMI (non-ST elevated myocardial infarction) ([place]) 2014   Port-A-Cath in place 06/17/2020   Prostate cancer ([place])    Type 2 diabetes mellitus ([place])     Surgical History: Past Surgical History:  Procedure Laterality Date   APPENDECTOMY     CORONARY/GRAFT ACUTE MI REVASCULARIZATION N/A 12/10/2019   Procedure: Coronary/Graft Acute MI Revascularization;  Surgeon: Nelva Bush, MD;  Location: [facility];  Service: Cardiovascular;  Laterality: N/A;   IR IMAGING GUIDED PORT INSERTION  06/19/2020   LEFT HEART CATH AND CORONARY ANGIOGRAPHY N/A 12/10/2019   Procedure: LEFT HEART CATH AND CORONARY ANGIOGRAPHY;   Surgeon: Nelva Bush, MD;  Location: [facility];  Service: Cardiovascular;  Laterality: N/A;   LYMPHADENECTOMY Bilateral 06/27/2014   Procedure: LYMPHADENECTOMY;  Surgeon: Raynelle Bring, MD;  Location: [facility];  Service: Urology;  Laterality: Bilateral;   ROBOT ASSISTED LAPAROSCOPIC RADICAL PROSTATECTOMY N/A 06/27/2014   Procedure: ROBOTIC ASSISTED LAPAROSCOPIC RADICAL PROSTATECTOMY LEVEL 3;  Surgeon: Raynelle Bring, MD;  [facility]: [facility];  Service: Urology;  Laterality: N/A;   TONSILLECTOMY      Home Medications:  Allergies as of 01/26/2021       Reactions   Xtandi [enzalutamide] Other (See Comments)   "Severe personality change" and blood clots   Penicillins Hives        Medication List        Accurate as of January 26, 2021  1:14 PM. If you have any questions, ask your nurse or doctor.          albuterol 108 (90 Base) MCG/ACT inhaler Commonly known as: VENTOLIN HFA Inhale 1-2 puffs into the lungs every 6 (six) hours as needed for wheezing or shortness of breath.   ASPERCREME EX Apply 1 application topically daily as needed (pain).   aspirin EC 81 MG tablet Take 81 mg by mouth 3 (three) times a week.   atorvastatin 40 MG tablet Commonly known as: LIPITOR TAKE 1 TABLET BY MOUTH AT BEDTIME.   CoQ10 200 MG Caps Take 200 mg by mouth daily.   fluticasone-salmeterol 115-21  MCG/ACT inhaler Commonly known as: ADVAIR HFA Inhale 2 puffs into the lungs 2 (two) times daily.   HM Calcium-Vitamin D 600-800 MG-UNIT Tabs Generic drug: Calcium Carb-Cholecalciferol Take 1 tablet by mouth 2 (two) times daily. 1800 mg per day   Ibuprofen PM 200-25 MG Caps Generic drug: Ibuprofen-diphenhydrAMINE HCl Take 1 tablet by mouth at bedtime as needed (sleep).   KRILL OIL PO Take 1 capsule by mouth daily.   lidocaine-prilocaine cream Commonly known as: EMLA Apply a small amount to port a cath site and cover with plastic wrap 1 hour prior to chemotherapy appointments    LUPRON DEPOT (2-MONTH) IM Inject 1 Dose into the muscle every 3 (three) months.   Lutein 20 MG Caps Take 20 mg by mouth daily.   methimazole 10 MG tablet Commonly known as: TAPAZOLE Take 10 mg by mouth every other day.   metoprolol succinate 25 MG 24 hr tablet Commonly known as: TOPROL-XL TAKE 1 TABLET BY MOUTH EVERY DAY   multivitamin tablet Take 1 tablet by mouth daily.   nitroGLYCERIN 0.4 MG SL tablet Commonly known as: Nitrostat Place 1 tablet (0.4 mg total) under the tongue every 5 (five) minutes as needed.   Potassium 99 MG Tabs Take 99 mg by mouth daily.   predniSONE 5 MG tablet Commonly known as: DELTASONE Take 1 tablet (5 mg total) by mouth daily with breakfast.   VITAMIN C PO Take 1 tablet by mouth daily.   Xgeva 120 MG/1.7ML Soln injection Generic drug: denosumab Inject 120 mg into the skin every 30 (thirty) days. Last received 11/07/20   XOFIGO IV Inject into the vein.        Allergies:  Allergies  Allergen Reactions   Xtandi [Enzalutamide] Other (See Comments)    "Severe personality change" and blood clots   Penicillins Hives    Family History: Family History  Problem Relation Age of Onset   Dementia Mother    Thyroid disease Mother    Clotting disorder Mother    Kidney Stones Father    Stroke Brother    Alzheimer's disease Maternal Uncle    Tuberculosis Paternal Grandmother    Heart attack Maternal Uncle        x4   Heart attack Cousin        in his 5s   Cancer Neg Hx     Social History:  reports that he quit smoking about 8 years ago. His smoking use included cigarettes. He has a 40.00 pack-year smoking history. He has never used smokeless tobacco. He reports current alcohol use. He reports that he does not use drugs.   Physical Exam: BP (!) 97/59   Pulse 82   Constitutional:  Alert and oriented, No acute distress. HEENT: [place] AT, moist mucus membranes.  Trachea midline, no masses. Cardiovascular: No clubbing, cyanosis, or  edema. Respiratory: Normal respiratory effort, no increased work of breathing. GI: Abdomen is soft, nontender, nondistended, no abdominal masses GU: No CVA tenderness Skin: No rashes, bruises or suspicious lesions. Neurologic: Grossly intact, no focal deficits, moving all 4 extremities. Psychiatric: Normal mood and affect.  Laboratory Data: Lab Results  Component Value Date   WBC 6.6 01/13/2021   HGB 12.7 (L) 01/13/2021   HCT 37.3 (L) 01/13/2021   MCV 94.9 01/13/2021   PLT 144 (L) 01/13/2021    Lab Results  Component Value Date   CREATININE 1.26 (H) 01/13/2021    Lab Results  Component Value Date   PSA 3.68 11/09/2016  Lab Results  Component Value Date   TESTOSTERONE <3 (L) 05/07/2020    Lab Results  Component Value Date   HGBA1C 6.7 (H) 11/20/2019    Urinalysis    Component Value Date/Time   COLORURINE YELLOW 10/06/2020 1435   APPEARANCEUR HAZY (A) 10/06/2020 1435   LABSPEC 1.017 10/06/2020 1435   PHURINE 6.0 10/06/2020 1435   GLUCOSEU NEGATIVE 10/06/2020 1435   HGBUR NEGATIVE 10/06/2020 1435   BILIRUBINUR NEGATIVE 10/06/2020 1435   KETONESUR NEGATIVE 10/06/2020 1435   PROTEINUR NEGATIVE 10/06/2020 1435   NITRITE NEGATIVE 10/06/2020 1435   LEUKOCYTESUR NEGATIVE 10/06/2020 1435    No results found for: LABMICR, WBCUA, RBCUA, LABEPIT, MUCUS, BACTERIA  Pertinent Imaging: 05/22 - CT and bone scan images reviewed   Reviewed labs, Dr Raliegh Ip and Dr Jerilynn Mages notes     Assessment & Plan:    1. Urinary frequency Disc UA and PVR look good. Consider surveillance, alpha blocker, OAB meds - disc nature r/b/a to these. He is not bothered and doesn't want to take medicine. See back for cystoscopy.  - Urinalysis, Routine w reflex microscopic  2. prostate cancer - under excellent care with Dr. Delton Coombes.    No follow-ups on file.  Festus Aloe, MD  [facility]  [address] [place], [place] [address] [phone]

## 2021-01-26 NOTE — Progress Notes (Signed)
Urological Symptom Review  Patient is experiencing the following symptoms: Frequent urination Hard to postpone urination Burning/pain with urination Get up at night to urinate Leakage of urine Stream starts and stops Trouble starting stream Have to strain to urinate Weak stream   Review of Systems  Gastrointestinal (upper)  : Negative for upper GI symptoms  Gastrointestinal (lower) : Negative for lower GI symptoms  Constitutional : Negative for symptoms  Skin: Negative for skin symptoms  Eyes: Negative for eye symptoms  Ear/Nose/Throat : Sinus problems  Hematologic/Lymphatic: Easy bruising  Cardiovascular : Leg swelling  Respiratory : Shortness of breath  Endocrine: Negative for endocrine symptoms  Musculoskeletal: Back pain  Neurological: Dizziness  Psychologic: Negative for psychiatric symptoms

## 2021-01-27 ENCOUNTER — Other Ambulatory Visit: Payer: Self-pay

## 2021-01-27 DIAGNOSIS — C7951 Secondary malignant neoplasm of bone: Secondary | ICD-10-CM

## 2021-01-30 ENCOUNTER — Inpatient Hospital Stay (HOSPITAL_COMMUNITY): Payer: Medicare Other

## 2021-02-02 ENCOUNTER — Telehealth: Payer: Self-pay | Admitting: *Deleted

## 2021-02-02 NOTE — Telephone Encounter (Signed)
Called patient to remind of lab and weight for 02-03-21 @ [facility], spoke with patient and he is aware of this appt.

## 2021-02-03 ENCOUNTER — Other Ambulatory Visit (HOSPITAL_COMMUNITY)
Admission: RE | Admit: 2021-02-03 | Discharge: 2021-02-03 | Disposition: A | Discharge status: Home / Self Care | Payer: Medicare Other | Source: Ambulatory Visit | Attending: Radiation Oncology | Admitting: Radiation Oncology

## 2021-02-03 ENCOUNTER — Inpatient Hospital Stay (HOSPITAL_COMMUNITY): Payer: Medicare Other

## 2021-02-03 ENCOUNTER — Encounter (HOSPITAL_COMMUNITY): Payer: Self-pay

## 2021-02-03 ENCOUNTER — Other Ambulatory Visit: Payer: Self-pay

## 2021-02-03 VITALS — BP 110/56 | HR 62 | Temp 97.3°F | Resp 18 | Wt 256.6 lb

## 2021-02-03 DIAGNOSIS — C7951 Secondary malignant neoplasm of bone: Secondary | ICD-10-CM | POA: Insufficient documentation

## 2021-02-03 DIAGNOSIS — C61 Malignant neoplasm of prostate: Secondary | ICD-10-CM | POA: Insufficient documentation

## 2021-02-03 DIAGNOSIS — Z5111 Encounter for antineoplastic chemotherapy: Secondary | ICD-10-CM | POA: Diagnosis not present

## 2021-02-03 LAB — CBC WITH DIFFERENTIAL/PLATELET
Abs Immature Granulocytes: 0.04 10*3/uL (ref 0.00–0.07)
Basophils Absolute: 0 10*3/uL (ref 0.0–0.1)
Basophils Relative: 0 %
Eosinophils Absolute: 0.2 10*3/uL (ref 0.0–0.5)
Eosinophils Relative: 3 %
HCT: 36.6 % — ABNORMAL LOW (ref 39.0–52.0)
Hemoglobin: 12.3 g/dL — ABNORMAL LOW (ref 13.0–17.0)
Immature Granulocytes: 1 %
Lymphocytes Relative: 17 %
Lymphs Abs: 1.2 10*3/uL (ref 0.7–4.0)
MCH: 32.3 pg (ref 26.0–34.0)
MCHC: 33.6 g/dL (ref 30.0–36.0)
MCV: 96.1 fL (ref 80.0–100.0)
Monocytes Absolute: 0.6 10*3/uL (ref 0.1–1.0)
Monocytes Relative: 9 %
Neutro Abs: 4.9 10*3/uL (ref 1.7–7.7)
Neutrophils Relative %: 70 %
Platelets: 149 10*3/uL — ABNORMAL LOW (ref 150–400)
RBC: 3.81 MIL/uL — ABNORMAL LOW (ref 4.22–5.81)
RDW: 13.7 % (ref 11.5–15.5)
WBC: 7.1 10*3/uL (ref 4.0–10.5)
nRBC: 0 % (ref 0.0–0.2)

## 2021-02-03 MED ORDER — LEUPROLIDE ACETATE (6 MONTH) 45 MG IM KIT
45.0000 mg | PACK | Freq: Once | INTRAMUSCULAR | Status: DC
  Filled 2021-02-03: qty 45

## 2021-02-03 MED ORDER — LEUPROLIDE ACETATE (6 MONTH) 45 MG ~~LOC~~ KIT
45.0000 mg | PACK | Freq: Once | SUBCUTANEOUS | Status: AC
  Administered 2021-02-03: 45 mg via SUBCUTANEOUS
  Filled 2021-02-03: qty 45

## 2021-02-03 NOTE — Progress Notes (Signed)
Cardiology Office Note  Date: 02/04/2021   ID: Hunter Jones, DOB [birth year], MRN [id number]  PCP:  Monico Blitz, MD  Cardiologist:  Rozann Lesches, MD Electrophysiologist:  None   Chief Complaint  Patient presents with   Cardiac follow-up    History of Present Illness: Hunter Jones is a 65 y.o. male last seen in February by Mr. Leonides Sake NP.  He is here for a follow-up visit.  Reports no active angina symptoms or nitroglycerin use.  He continues to undergo treatments for prostate cancer.  Follow-up cardiac testing from March of this year is noted below.  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  Myoview study showed region of inferolateral scar with mild peri-infarct ischemia, LVEF was calculated at 39%, however echocardiogram was also obtained showing LVEF 50 to 55%.  Based on both of the studies and his symptomatic stability on medical therapy, he was cleared.  We went over his medications today.  He is now over a year out from last DES intervention and we will plan to stop Plavix when the bottle is empty.  He will stay on aspirin 81 mg daily.  I personally reviewed his ECG today which is normal.  Also requesting interval lab work from Dr. Manuella Ghazi.  Past Medical History:  Diagnosis Date   Acute ST elevation myocardial infarction (STEMI) of inferior wall ([place]) 11/2019   Asthma    COPD (chronic obstructive pulmonary disease) (HCC)    Coronary artery disease    DES to mid LAD August 2014 ([facility]); overlapping DESx2 prox-mid RCA 11/2019   Essential hypertension    Hyperthyroidism    NSTEMI (non-ST elevated myocardial infarction) ([place]) 2014   Port-A-Cath in place 06/17/2020   Prostate cancer ([place])    Type 2 diabetes mellitus ([place])     Past Surgical History:  Procedure Laterality Date   APPENDECTOMY     CORONARY/GRAFT ACUTE MI REVASCULARIZATION N/A 12/10/2019   Procedure: Coronary/Graft Acute MI Revascularization;  Surgeon: Nelva Bush, MD;  Location: [facility];   Service: Cardiovascular;  Laterality: N/A;   IR IMAGING GUIDED PORT INSERTION  06/19/2020   LEFT HEART CATH AND CORONARY ANGIOGRAPHY N/A 12/10/2019   Procedure: LEFT HEART CATH AND CORONARY ANGIOGRAPHY;  Surgeon: Nelva Bush, MD;  Location: [facility];  Service: Cardiovascular;  Laterality: N/A;   LYMPHADENECTOMY Bilateral 06/27/2014   Procedure: LYMPHADENECTOMY;  Surgeon: Raynelle Bring, MD;  [facility];  Service: Urology;  Laterality: Bilateral;   ROBOT ASSISTED LAPAROSCOPIC RADICAL PROSTATECTOMY N/A 06/27/2014   Procedure: ROBOTIC ASSISTED LAPAROSCOPIC RADICAL PROSTATECTOMY LEVEL 3;  Surgeon: Raynelle Bring, MD;  [facility]: [facility];  Service: Urology;  Laterality: N/A;   TONSILLECTOMY      Current Outpatient Medications  Medication Sig Dispense Refill   albuterol (PROVENTIL HFA;VENTOLIN HFA) 108 (90 BASE) MCG/ACT inhaler Inhale 1-2 puffs into the lungs every 6 (six) hours as needed for wheezing or shortness of breath.     Ascorbic Acid (VITAMIN C PO) Take 1 tablet by mouth daily.     atorvastatin (LIPITOR) 40 MG tablet TAKE 1 TABLET BY MOUTH AT BEDTIME. (Patient taking differently: Take 40 mg by mouth at bedtime.) 30 tablet 6   Calcium Carb-Cholecalciferol (HM CALCIUM-VITAMIN D) 600-800 MG-UNIT TABS Take 1 tablet by mouth 2 (two) times daily. 1800 mg per day     Coenzyme Q10 (COQ10) 200 MG CAPS Take 200 mg by mouth daily.     denosumab (XGEVA) 120 MG/1.7ML SOLN injection Inject  120 mg into the skin every 30 (thirty) days. Last received 11/07/20     fluticasone-salmeterol (ADVAIR HFA) 115-21 MCG/ACT inhaler Inhale 2 puffs into the lungs 2 (two) times daily.      Ibuprofen-diphenhydrAMINE HCl (IBUPROFEN PM) 200-25 MG CAPS Take 1 tablet by mouth at bedtime as needed (sleep).     KRILL OIL PO Take 1 capsule by mouth daily.     Leuprolide Acetate, 3 Month, (LUPRON DEPOT, 56-MONTH, IM) Inject 1 Dose into the muscle every 3 (three) months.     lidocaine-prilocaine (EMLA) cream Apply a  small amount to port a cath site and cover with plastic wrap 1 hour prior to chemotherapy appointments 30 g 3   Lutein 20 MG CAPS Take 20 mg by mouth daily.     methimazole (TAPAZOLE) 10 MG tablet Take 10 mg by mouth every other day.      metoprolol succinate (TOPROL-XL) 25 MG 24 hr tablet TAKE 1 TABLET BY MOUTH EVERY DAY 90 tablet 3   Multiple Vitamin (MULTIVITAMIN) tablet Take 1 tablet by mouth daily.     nitroGLYCERIN (NITROSTAT) 0.4 MG SL tablet Place 1 tablet (0.4 mg total) under the tongue every 5 (five) minutes as needed. 25 tablet 12   Potassium 99 MG TABS Take 99 mg by mouth daily.     predniSONE (DELTASONE) 5 MG tablet Take 1 tablet (5 mg total) by mouth daily with breakfast. 60 tablet 11   Radium Ra 223 Dichloride (XOFIGO IV) Inject into the vein.     Trolamine Salicylate (ASPERCREME EX) Apply 1 application topically daily as needed (pain).     aspirin EC 81 MG tablet Take 81 mg by mouth 3 (three) times a week. (Patient not taking: Reported on 02/04/2021)     clopidogrel (PLAVIX) 75 MG tablet Take by mouth.     No current facility-administered medications for this visit.   Allergies:  Xtandi [enzalutamide] and Penicillins   ROS: No syncope.  Physical Exam: VS:  BP (!) 102/56   Pulse 92   Ht 5' 10.5" (1.791 m)   Wt 258 lb 11.2 oz (117.3 kg)   SpO2 96%   BMI 36.59 kg/m , BMI Body mass index is 36.59 kg/m.  Wt Readings from Last 3 Encounters:  02/04/21 258 lb 11.2 oz (117.3 kg)  02/03/21 256 lb 9.6 oz (116.4 kg)  01/21/21 254 lb 13.6 oz (115.6 kg)    General: Patient appears comfortable at rest. HEENT: Conjunctiva and lids normal, wearing a mask. Neck: Supple, no elevated JVP or carotid bruits, no thyromegaly. Lungs: Clear to auscultation, nonlabored breathing at rest. Cardiac: Regular rate and rhythm, no S3 or significant systolic murmur. Extremities: No pitting edema.  ECG:  An ECG dated 12/10/2019 was personally reviewed today and demonstrated:  Sinus rhythm with  inferior injury current.  Recent Labwork: 01/13/2021: ALT 24; AST 27; BUN 33; Creatinine, Ser 1.26; Magnesium 1.7; Potassium 4.6; Sodium 136 02/03/2021: Hemoglobin 12.3; Platelets 149     Component Value Date/Time   CHOL 68 12/10/2019 2351   TRIG 91 12/10/2019 2351   HDL 28 (L) 12/10/2019 2351   CHOLHDL 2.4 12/10/2019 2351   VLDL 18 12/10/2019 2351   LDLCALC 22 12/10/2019 2351    Other Studies Reviewed Today:  Lexiscan Myoview 09/08/2020: No diagnostic ST segment changes to indicate ischemia. Moderate intensity, apical to basal inferolateral defect that is partially reversible suggesting scar with mild peri-infarct ischemia. There is also radiotracer uptake within the gut adjacent to the inferior wall  on rest imaging which could be causing artifactual reversibility in this area. This is an intermediate risk study, based on calculated LVEF. Suggest echocardiogram to clarify if not performed recently. Nuclear stress EF: 39%.  Echocardiogram 09/10/2020:  1. Left ventricular ejection fraction, by estimation, is 50 to 55%. The  left ventricle has low normal function. The left ventricle demonstrates  regional wall motion abnormalities (see scoring diagram/findings for  description). The left ventricular  internal cavity size was mildly dilated. There is mild left ventricular  hypertrophy. Left ventricular diastolic parameters were normal.   2. Right ventricular systolic function is normal. The right ventricular  size is normal. Tricuspid regurgitation signal is inadequate for assessing  PA pressure.   3. Left atrial size was mildly dilated.   4. The mitral valve is grossly normal. Trivial mitral valve  regurgitation.   5. The aortic valve is tricuspid. Aortic valve regurgitation is mild.  Mild aortic valve sclerosis is present, with no evidence of aortic valve  stenosis.   6. The inferior vena cava is normal in size with greater than 50%  respiratory variability, suggesting right atrial  pressure of 3 mmHg.   Assessment and Plan:  1.  CAD status post DES to the mid LAD in 2014 with subsequent DES x2 to the proximal and mid RCA in June 2021 in the setting of inferior STEMI.  He completed cardiac rehabilitation and is doing well without active angina on medical therapy.  Plan to discontinue Plavix and continue aspirin 81 mg daily.  Otherwise continue Toprol-XL, Lipitor, and as needed nitroglycerin.  2.  Mixed hyperlipidemia, on Lipitor with aggressively lowered lipids as of June 2021.  Requesting interval lab work from Dr. Manuella Ghazi.  Medication Adjustments/Labs and Tests Ordered: Current medicines are reviewed at length with the patient today.  Concerns regarding medicines are outlined above.   Tests Ordered: Orders Placed This Encounter  Procedures   EKG 12-Lead    Medication Changes: No orders of the defined types were placed in this encounter.   Disposition:  Follow up  6 months.  Signed, Satira Sark, MD, [facility] 02/04/2021 10:33 AM    [facility] at [place], [place], [place] [address] Phone: [phone]; Fax: [phone]

## 2021-02-03 NOTE — Patient Instructions (Signed)
[facility]  Discharge Instructions: Thank you for choosing [facility] to provide your oncology and hematology care.  If you have a lab appointment with the [facility], please come in thru the Main Entrance and check in at the main information desk.  Wear comfortable clothing and clothing appropriate for easy access to any Portacath or PICC line.   We strive to give you quality time with your provider. You may need to reschedule your appointment if you arrive late (15 or more minutes).  Arriving late affects you and other patients whose appointments are after yours.  Also, if you miss three or more appointments without notifying the office, you may be dismissed from the clinic at the provider's discretion.      For prescription refill requests, have your pharmacy contact our office and allow 72 hours for refills to be completed.    Today you received the following chemotherapy and/or immunotherapy agents Leuprolide injection. Return as scheduled.   To help prevent nausea and vomiting after your treatment, we encourage you to take your nausea medication as directed.  BELOW ARE SYMPTOMS THAT SHOULD BE REPORTED IMMEDIATELY: *FEVER GREATER THAN 100.4 F (38 C) OR HIGHER *CHILLS OR SWEATING *NAUSEA AND VOMITING THAT IS NOT CONTROLLED WITH YOUR NAUSEA MEDICATION *UNUSUAL SHORTNESS OF BREATH *UNUSUAL BRUISING OR BLEEDING *URINARY PROBLEMS (pain or burning when urinating, or frequent urination) *BOWEL PROBLEMS (unusual diarrhea, constipation, pain near the anus) TENDERNESS IN MOUTH AND THROAT WITH OR WITHOUT PRESENCE OF ULCERS (sore throat, sores in mouth, or a toothache) UNUSUAL RASH, SWELLING OR PAIN  UNUSUAL VAGINAL DISCHARGE OR ITCHING   Items with * indicate a potential emergency and should be followed up as soon as possible or go to the Emergency Department if any problems should occur.  Please show the CHEMOTHERAPY ALERT CARD or IMMUNOTHERAPY ALERT CARD at  check-in to the Emergency Department and triage nurse.  Should you have questions after your visit or need to cancel or reschedule your appointment, please contact [facility] [phone]  and follow the prompts.  Office hours are 8:00 a.m. to 4:30 p.m. Monday - Friday. Please note that voicemails left after 4:00 p.m. may not be returned until the following business day.  We are closed weekends and major holidays. You have access to a nurse at all times for urgent questions. Please call the main number to the clinic [phone] and follow the prompts.  For any non-urgent questions, you may also contact your provider using MyChart. We now offer e-Visits for anyone 33 and older to request care online for non-urgent symptoms. For details visit [email or web address].   Also download the MyChart app! Go to the app store, search "MyChart", open the app, select [facility], and log in with your MyChart username and password.  Due to Covid, a mask is required upon entering the hospital/clinic. If you do not have a mask, one will be given to you upon arrival. For doctor visits, patients may have 1 support person aged 27 or older with them. For treatment visits, patients cannot have anyone with them due to current Covid guidelines and our immunocompromised population.

## 2021-02-03 NOTE — Progress Notes (Signed)
Patient tolerated leuprolide injection with no complaints voiced. Site clean and dry with no bruising or swelling noted at site. See MAR for details. Band aid applied.  Patient stable during and after injection. VSS with discharge and left in satisfactory condition with no s/s of distress noted.

## 2021-02-04 ENCOUNTER — Encounter: Payer: Self-pay | Admitting: *Deleted

## 2021-02-04 ENCOUNTER — Encounter: Payer: Self-pay | Admitting: Cardiology

## 2021-02-04 ENCOUNTER — Ambulatory Visit (INDEPENDENT_AMBULATORY_CARE_PROVIDER_SITE_OTHER): Payer: Medicare Other | Admitting: Cardiology

## 2021-02-04 VITALS — BP 102/56 | HR 92 | Ht 70.5 in | Wt 258.7 lb

## 2021-02-04 DIAGNOSIS — E782 Mixed hyperlipidemia: Secondary | ICD-10-CM

## 2021-02-04 DIAGNOSIS — I25119 Atherosclerotic heart disease of native coronary artery with unspecified angina pectoris: Secondary | ICD-10-CM | POA: Diagnosis not present

## 2021-02-04 DIAGNOSIS — I2510 Atherosclerotic heart disease of native coronary artery without angina pectoris: Secondary | ICD-10-CM

## 2021-02-04 NOTE — Patient Instructions (Addendum)
Medication Instructions:  Your physician has recommended you make the following change in your medication:  Stop clopidogrel after you finish your current supply Continue other medications the same  Labwork: none  Testing/Procedures: none  Follow-Up: Your physician recommends that you schedule a follow-up appointment in: 6 months  Any Other Special Instructions Will Be Listed Below (If Applicable).  If you need a refill on your cardiac medications before your next appointment, please call your pharmacy.

## 2021-02-09 ENCOUNTER — Telehealth: Payer: Self-pay | Admitting: *Deleted

## 2021-02-09 NOTE — Telephone Encounter (Signed)
CALLED PATIENT TO REMIND OF XOFIGO INJ. FOR 02-10-21- ARRIVAL TIME- 11:15 AM @ [facility], SPOKE WITH PATIENT AND HE IS AWARE OF THIS APPT.

## 2021-02-10 ENCOUNTER — Other Ambulatory Visit: Payer: Self-pay

## 2021-02-10 ENCOUNTER — Ambulatory Visit (HOSPITAL_COMMUNITY)
Admission: RE | Admit: 2021-02-10 | Discharge: 2021-02-10 | Disposition: A | Discharge status: Home / Self Care | Payer: Medicare Other | Source: Ambulatory Visit | Attending: Radiation Oncology | Admitting: Radiation Oncology

## 2021-02-10 DIAGNOSIS — C61 Malignant neoplasm of prostate: Secondary | ICD-10-CM | POA: Insufficient documentation

## 2021-02-10 DIAGNOSIS — C7952 Secondary malignant neoplasm of bone marrow: Secondary | ICD-10-CM | POA: Insufficient documentation

## 2021-02-10 DIAGNOSIS — C7951 Secondary malignant neoplasm of bone: Secondary | ICD-10-CM | POA: Diagnosis present

## 2021-02-10 MED ORDER — RADIUM RA 223 DICHLORIDE 30 MCCI/ML IV SOLN
169.5000 | Freq: Once | INTRAVENOUS | Status: AC
  Administered 2021-02-10: 169.5 via INTRAVENOUS

## 2021-02-10 NOTE — Addendum Note (Signed)
Encounter addended by: Tyler Pita, MD on: 02/10/2021 11:25 AM  Actions taken: Medication List reviewed, Problem List reviewed, Allergies reviewed, Visit diagnoses modified, Clinical Note Signed

## 2021-02-10 NOTE — Progress Notes (Signed)
  Radiation Oncology         [phone] ________________________________  Name: BUCK ILGENFRITZ MRN: [id number]  Date: 01/09/2021  DOB: 1956-06-02  Radium-223 Infusion Note  Diagnosis:  Castration resistant prostate cancer with painful bone involvement  Current Infusion:    2  Planned Infusions:  6  Narrative: Mr. DEAVIN POSEN presented to nuclear medicine for treatment. His most recent blood counts were reviewed.  He remains a good candidate to proceed with Ra-223.  The patient was situated in an infusion suite with a contact barrier placed under his arm. Intravenous access was established, using sterile technique, and a normal saline infusion from a syringe was started.  Micro-dosimetry:  The prescribed radiation activity was assayed and confirmed to be within specified tolerance.  Special Treatment Procedure - Infusion:  The nuclear medicine technologist and I personally verified the dose activity to be delivered as specified in the written directive, and verified the patient identification via 2 separate methods.  The syringe containing the dose was attached to an intravenous access and the dose delivered over a minute. No complications were noted.  The total administered dose was 180.7 microcuries.   A saline flush of the line and the syringe that contained the isotope was then performed.  The residual radioactivity in the syringe was 3.48 microcuries.   Pressure was applied to the venipuncture site, and a compression bandage placed.   Radiation Safety personnel were present to perform the discharge survey, as detailed on their documentation.   After a short period of observation, the patient had his IV removed.  Impression:  The patient tolerated his infusion relatively well.  Plan:  The patient will return in one month for ongoing care.    ________________________________  Sheral Apley. Tammi Klippel, M.D.

## 2021-02-13 ENCOUNTER — Telehealth: Payer: Self-pay | Admitting: *Deleted

## 2021-02-13 ENCOUNTER — Other Ambulatory Visit (HOSPITAL_COMMUNITY): Payer: Self-pay | Admitting: Radiation Oncology

## 2021-02-13 DIAGNOSIS — C61 Malignant neoplasm of prostate: Secondary | ICD-10-CM

## 2021-02-13 DIAGNOSIS — Z192 Hormone resistant malignancy status: Secondary | ICD-10-CM

## 2021-02-13 NOTE — Telephone Encounter (Signed)
CALLED PATIENT TO INFORM OF LAB AND WEIGHT FOR 03-06-21 @ [facility]. ON 03-13-21 - ARRIVAL TIME- 11:15 AM @ [facility], SPOKE WITH PATIENT'S WIFE- ERNESTINE AND SHE IS AWARE OF THESE APPTS.

## 2021-02-18 ENCOUNTER — Inpatient Hospital Stay (HOSPITAL_COMMUNITY): Payer: Medicare Other

## 2021-02-18 ENCOUNTER — Other Ambulatory Visit: Payer: Self-pay

## 2021-02-18 ENCOUNTER — Inpatient Hospital Stay (HOSPITAL_COMMUNITY): Payer: Medicare Other | Attending: Hematology

## 2021-02-18 VITALS — BP 108/55 | HR 76 | Temp 97.1°F | Resp 18 | Wt 255.0 lb

## 2021-02-18 DIAGNOSIS — C61 Malignant neoplasm of prostate: Secondary | ICD-10-CM | POA: Diagnosis present

## 2021-02-18 DIAGNOSIS — C7951 Secondary malignant neoplasm of bone: Secondary | ICD-10-CM | POA: Diagnosis not present

## 2021-02-18 LAB — CBC WITH DIFFERENTIAL/PLATELET
Abs Immature Granulocytes: 0.04 10*3/uL (ref 0.00–0.07)
Basophils Absolute: 0 10*3/uL (ref 0.0–0.1)
Basophils Relative: 0 %
Eosinophils Absolute: 0.2 10*3/uL (ref 0.0–0.5)
Eosinophils Relative: 2 %
HCT: 33.9 % — ABNORMAL LOW (ref 39.0–52.0)
Hemoglobin: 11.5 g/dL — ABNORMAL LOW (ref 13.0–17.0)
Immature Granulocytes: 1 %
Lymphocytes Relative: 12 %
Lymphs Abs: 0.8 10*3/uL (ref 0.7–4.0)
MCH: 32.6 pg (ref 26.0–34.0)
MCHC: 33.9 g/dL (ref 30.0–36.0)
MCV: 96 fL (ref 80.0–100.0)
Monocytes Absolute: 0.5 10*3/uL (ref 0.1–1.0)
Monocytes Relative: 7 %
Neutro Abs: 5.2 10*3/uL (ref 1.7–7.7)
Neutrophils Relative %: 78 %
Platelets: 135 10*3/uL — ABNORMAL LOW (ref 150–400)
RBC: 3.53 MIL/uL — ABNORMAL LOW (ref 4.22–5.81)
RDW: 14.2 % (ref 11.5–15.5)
WBC: 6.6 10*3/uL (ref 4.0–10.5)
nRBC: 0 % (ref 0.0–0.2)

## 2021-02-18 LAB — COMPREHENSIVE METABOLIC PANEL
ALT: 22 U/L (ref 0–44)
AST: 26 U/L (ref 15–41)
Albumin: 3.3 g/dL — ABNORMAL LOW (ref 3.5–5.0)
Alkaline Phosphatase: 172 U/L — ABNORMAL HIGH (ref 38–126)
Anion gap: 6 (ref 5–15)
BUN: 29 mg/dL — ABNORMAL HIGH (ref 8–23)
CO2: 23 mmol/L (ref 22–32)
Calcium: 8.8 mg/dL — ABNORMAL LOW (ref 8.9–10.3)
Chloride: 109 mmol/L (ref 98–111)
Creatinine, Ser: 1.03 mg/dL (ref 0.61–1.24)
GFR, Estimated: >60 mL/min (ref >60)
Glucose, Bld: 118 mg/dL — ABNORMAL HIGH (ref 70–99)
Potassium: 4.5 mmol/L (ref 3.5–5.1)
Sodium: 138 mmol/L (ref 135–145)
Total Bilirubin: 0.6 mg/dL (ref 0.3–1.2)
Total Protein: 6.2 g/dL — ABNORMAL LOW (ref 6.5–8.1)

## 2021-02-18 LAB — PSA: Prostatic Specific Antigen: 565 ng/mL — ABNORMAL HIGH (ref 0.00–4.00)

## 2021-02-18 MED ORDER — DENOSUMAB 120 MG/1.7ML ~~LOC~~ SOLN
120.0000 mg | Freq: Once | SUBCUTANEOUS | Status: AC
  Administered 2021-02-18: 120 mg via SUBCUTANEOUS
  Filled 2021-02-18: qty 1.7

## 2021-02-18 NOTE — Patient Instructions (Signed)
[facility]  Discharge Instructions: Thank you for choosing [facility] to provide your oncology and hematology care.  If you have a lab appointment with the Cancer Center, please come in thru the Main Entrance and check in at the main information desk.  Wear comfortable clothing and clothing appropriate for easy access to any Portacath or PICC line.   We strive to give you quality time with your provider. You may need to reschedule your appointment if you arrive late (15 or more minutes).  Arriving late affects you and other patients whose appointments are after yours.  Also, if you miss three or more appointments without notifying the office, you may be dismissed from the clinic at the provider's discretion.      For prescription refill requests, have your pharmacy contact our office and allow 72 hours for refills to be completed.    Today you received the following chemotherapy and/or immunotherapy agents Xgeva      To help prevent nausea and vomiting after your treatment, we encourage you to take your nausea medication as directed.  BELOW ARE SYMPTOMS THAT SHOULD BE REPORTED IMMEDIATELY: *FEVER GREATER THAN 100.4 F (38 C) OR HIGHER *CHILLS OR SWEATING *NAUSEA AND VOMITING THAT IS NOT CONTROLLED WITH YOUR NAUSEA MEDICATION *UNUSUAL SHORTNESS OF BREATH *UNUSUAL BRUISING OR BLEEDING *URINARY PROBLEMS (pain or burning when urinating, or frequent urination) *BOWEL PROBLEMS (unusual diarrhea, constipation, pain near the anus) TENDERNESS IN MOUTH AND THROAT WITH OR WITHOUT PRESENCE OF ULCERS (sore throat, sores in mouth, or a toothache) UNUSUAL RASH, SWELLING OR PAIN  UNUSUAL VAGINAL DISCHARGE OR ITCHING   Items with * indicate a potential emergency and should be followed up as soon as possible or go to the Emergency Department if any problems should occur.  Please show the CHEMOTHERAPY ALERT CARD or IMMUNOTHERAPY ALERT CARD at check-in to the Emergency Department  and triage nurse.  Should you have questions after your visit or need to cancel or reschedule your appointment, please contact [facility] [phone]  and follow the prompts.  Office hours are 8:00 a.m. to 4:30 p.m. Monday - Friday. Please note that voicemails left after 4:00 p.m. may not be returned until the following business day.  We are closed weekends and major holidays. You have access to a nurse at all times for urgent questions. Please call the main number to the clinic [phone] and follow the prompts.  For any non-urgent questions, you may also contact your provider using MyChart. We now offer e-Visits for anyone 18 and older to request care online for non-urgent symptoms. For details visit [email or web address].   Also download the MyChart app! Go to the app store, search "MyChart", open the app, select , and log in with your MyChart username and password.  Due to Covid, a mask is required upon entering the hospital/clinic. If you do not have a mask, one will be given to you upon arrival. For doctor visits, patients may have 1 support person aged 18 or older with them. For treatment visits, patients cannot have anyone with them due to current Covid guidelines and our immunocompromised population.  

## 2021-02-18 NOTE — Progress Notes (Signed)
Patients port flushed without difficulty.  Good blood return noted with no bruising or swelling noted at site.  Band aid applied.  VSS with discharge and left in satisfactory condition with no s/s of distress noted.   

## 2021-02-18 NOTE — Progress Notes (Signed)
Patient to receive Xgeva injection per providers order.  Vital signs WNL.  Labs pending.  Patient has been taking Calcium supplements, has had no jaw pain or major dental work.   Xgeva administration without incident; injection site WNL; see MAR for injection details.  Patient tolerated procedure well and without incident.  No questions or complaints noted at this time.   Discharge from clinic ambulatory in stable condition.  Alert and oriented X 3.  Follow up with [facility] as scheduled.

## 2021-02-25 ENCOUNTER — Other Ambulatory Visit: Payer: Self-pay

## 2021-02-25 DIAGNOSIS — C7951 Secondary malignant neoplasm of bone: Secondary | ICD-10-CM

## 2021-02-27 ENCOUNTER — Telehealth: Payer: Self-pay | Admitting: *Deleted

## 2021-02-27 NOTE — Progress Notes (Signed)
  Radiation Oncology         [phone]) [phone] ________________________________  Name: Hunter Jones MRN: [id number]  Date: 02/10/2021  DOB: 1956/06/11  Radium-223 Infusion Note  Diagnosis:  Castration resistant prostate cancer with painful bone involvement  Current Infusion:    3  Planned Infusions:  6  Narrative: [name] presented to nuclear medicine for treatment. His most recent blood counts were reviewed.  He remains a good candidate to proceed with Ra-223.  The patient was situated in an infusion suite with a contact barrier placed under his arm. Intravenous access was established, using sterile technique, and a normal saline infusion from a syringe was started.  Micro-dosimetry:  The prescribed radiation activity was assayed and confirmed to be within specified tolerance.  Special Treatment Procedure - Infusion:  The nuclear medicine technologist and I personally verified the dose activity to be delivered as specified in the written directive, and verified the patient identification via 2 separate methods.  The syringe containing the dose was attached to an intravenous access and the dose delivered over a minute. No complications were noted.  The total administered dose was 172.1 microcuries.   A saline flush of the line and the syringe that contained the isotope was then performed.  The residual radioactivity in the syringe was 2.6 microcuries, so the actual infused isotope activity was 169.5 microcuries.   Pressure was applied to the venipuncture site, and a compression bandage placed.   Radiation Safety personnel were present to perform the discharge survey, as detailed on their documentation.   After a short period of observation, the patient had his IV removed.  Impression:  The patient tolerated his infusion relatively well.  Plan:  The patient will return in one month for ongoing care.    ________________________________  Sheral Apley. Tammi Klippel, M.D.

## 2021-02-27 NOTE — Telephone Encounter (Signed)
CALLED PATIENT TO REMIND OF LAB AND WEIGHT APPT. ON 03-06-21 @ [facility] AND TO INFORM THAT [facility] WOULD CALL HIM ON 03-06-21 TO GET HIS WEIGHT, PATIENT'S WIFE VERIFIED UNDERSTANDING THIS

## 2021-03-05 ENCOUNTER — Other Ambulatory Visit (HOSPITAL_COMMUNITY)
Admission: RE | Admit: 2021-03-05 | Discharge: 2021-03-05 | Disposition: A | Discharge status: Home / Self Care | Payer: Medicare Other | Source: Ambulatory Visit | Attending: Radiation Oncology | Admitting: Radiation Oncology

## 2021-03-05 ENCOUNTER — Other Ambulatory Visit: Payer: Self-pay

## 2021-03-05 DIAGNOSIS — C7951 Secondary malignant neoplasm of bone: Secondary | ICD-10-CM | POA: Diagnosis present

## 2021-03-05 DIAGNOSIS — C7952 Secondary malignant neoplasm of bone marrow: Secondary | ICD-10-CM | POA: Insufficient documentation

## 2021-03-05 LAB — CBC WITH DIFFERENTIAL/PLATELET
Abs Immature Granulocytes: 0.07 10*3/uL (ref 0.00–0.07)
Basophils Absolute: 0 10*3/uL (ref 0.0–0.1)
Basophils Relative: 1 %
Eosinophils Absolute: 0.1 10*3/uL (ref 0.0–0.5)
Eosinophils Relative: 2 %
HCT: 32.6 % — ABNORMAL LOW (ref 39.0–52.0)
Hemoglobin: 11 g/dL — ABNORMAL LOW (ref 13.0–17.0)
Immature Granulocytes: 1 %
Lymphocytes Relative: 17 %
Lymphs Abs: 1 10*3/uL (ref 0.7–4.0)
MCH: 32.3 pg (ref 26.0–34.0)
MCHC: 33.7 g/dL (ref 30.0–36.0)
MCV: 95.6 fL (ref 80.0–100.0)
Monocytes Absolute: 0.5 10*3/uL (ref 0.1–1.0)
Monocytes Relative: 9 %
Neutro Abs: 4.1 10*3/uL (ref 1.7–7.7)
Neutrophils Relative %: 70 %
Platelets: 132 10*3/uL — ABNORMAL LOW (ref 150–400)
RBC: 3.41 MIL/uL — ABNORMAL LOW (ref 4.22–5.81)
RDW: 14.3 % (ref 11.5–15.5)
WBC: 5.9 10*3/uL (ref 4.0–10.5)
nRBC: 0 % (ref 0.0–0.2)

## 2021-03-12 ENCOUNTER — Telehealth: Payer: Self-pay | Admitting: *Deleted

## 2021-03-12 NOTE — Telephone Encounter (Signed)
CALLED PATIENT TO REMIND OF XOFIGO INJ. ON 03-13-21- ARRIVAL TIME- 11:15 AM @ [facility], SPOKE WITH PATIENT AND HE IS AWARE OF THIS APPT.

## 2021-03-13 ENCOUNTER — Encounter (HOSPITAL_COMMUNITY)
Admission: RE | Admit: 2021-03-13 | Discharge: 2021-03-13 | Disposition: A | Discharge status: Home / Self Care | Payer: Medicare Other | Source: Ambulatory Visit | Attending: Radiation Oncology | Admitting: Radiation Oncology

## 2021-03-13 ENCOUNTER — Other Ambulatory Visit: Payer: Self-pay

## 2021-03-13 DIAGNOSIS — C61 Malignant neoplasm of prostate: Secondary | ICD-10-CM | POA: Insufficient documentation

## 2021-03-13 DIAGNOSIS — C7951 Secondary malignant neoplasm of bone: Secondary | ICD-10-CM

## 2021-03-13 DIAGNOSIS — Z192 Hormone resistant malignancy status: Secondary | ICD-10-CM | POA: Diagnosis present

## 2021-03-13 DIAGNOSIS — C7952 Secondary malignant neoplasm of bone marrow: Secondary | ICD-10-CM

## 2021-03-13 MED ORDER — RADIUM RA 223 DICHLORIDE 30 MCCI/ML IV SOLN
178.2300 | Freq: Once | INTRAVENOUS | Status: AC | PRN
  Administered 2021-03-13: 178.23 via INTRAVENOUS

## 2021-03-16 ENCOUNTER — Telehealth: Payer: Self-pay | Admitting: *Deleted

## 2021-03-16 ENCOUNTER — Encounter: Payer: Self-pay | Admitting: Urology

## 2021-03-16 ENCOUNTER — Other Ambulatory Visit: Payer: Self-pay

## 2021-03-16 ENCOUNTER — Ambulatory Visit (INDEPENDENT_AMBULATORY_CARE_PROVIDER_SITE_OTHER): Payer: Medicare Other | Admitting: Urology

## 2021-03-16 VITALS — BP 101/61 | HR 105 | Temp 98.6°F | Wt 255.6 lb

## 2021-03-16 DIAGNOSIS — C61 Malignant neoplasm of prostate: Secondary | ICD-10-CM

## 2021-03-16 DIAGNOSIS — R350 Frequency of micturition: Secondary | ICD-10-CM

## 2021-03-16 LAB — URINALYSIS, ROUTINE W REFLEX MICROSCOPIC
Bilirubin, UA: NEGATIVE
Glucose, UA: NEGATIVE
Leukocytes,UA: NEGATIVE
Nitrite, UA: NEGATIVE
RBC, UA: NEGATIVE
Specific Gravity, UA: >1.03 — ABNORMAL HIGH (ref 1.005–1.030)
Urobilinogen, Ur: 0.2 mg/dL (ref 0.2–1.0)
pH, UA: 6 (ref 5.0–7.5)

## 2021-03-16 LAB — MICROSCOPIC EXAMINATION
Bacteria, UA: NONE SEEN
Epithelial Cells (non renal): NONE SEEN /hpf (ref 0–10)
RBC, Urine: NONE SEEN /hpf (ref 0–2)
Renal Epithel, UA: NONE SEEN /hpf
WBC, UA: NONE SEEN /hpf (ref 0–5)

## 2021-03-16 MED ORDER — CIPROFLOXACIN HCL 500 MG PO TABS
500.0000 mg | ORAL_TABLET | Freq: Once | ORAL | Status: AC
  Administered 2021-03-16: 500 mg via ORAL

## 2021-03-16 NOTE — Telephone Encounter (Signed)
CALLED PATIENT TO ASK QUESTION, LVM FOR A RETURN CALL 

## 2021-03-16 NOTE — Progress Notes (Signed)
Urological Symptom Review  Patient is experiencing the following symptoms: Frequent urination Get up at night to urinate   Review of Systems  Gastrointestinal (upper)  : Negative for upper GI symptoms  Gastrointestinal (lower) : Negative for lower GI symptoms  Constitutional : Negative for symptoms  Skin: Negative for skin symptoms  Eyes: Negative for eye symptoms  Ear/Nose/Throat : Negative for Ear/Nose/Throat symptoms  Hematologic/Lymphatic: Negative for Hematologic/Lymphatic symptoms  Cardiovascular : Negative for cardiovascular symptoms  Respiratory : Negative for respiratory symptoms  Endocrine: Negative for endocrine symptoms  Musculoskeletal: Negative for musculoskeletal symptoms  Neurological: Dizziness  Psychologic: Negative for psychiatric symptoms

## 2021-03-16 NOTE — Progress Notes (Signed)
   03/16/21  CC: No chief complaint on file.   HPI: F/u   1) LUTS - pt c/o weak stream day and night - maybe worse at night. No gross hematuria. Sitting to void. PVR today 0 and UA is clear. His 08/22 Cr was 1.26. He has some frequency and urgency. AUASS = 30. No incontinence. Drinks a lot of water. Cysto today, 10/22, with 8 Fr flimsy membranous stricture and benign bladder.    2) PCa - metastatic prostate ca under management with Dr. Delton Coombes.  PRIOR THERAPY:  1. Radical resection of prostate on 06/27/2014 - Dr. Alinda Money  2. Zytiga from 05/19/2018 to 11/09/2019. 3. Xtandi from 11/21/2019 to 12/05/2019, stopped due to mood changes 4. Docetaxel from 06/23/2020 through 10/05/2020.   CURRENT THERAPY: Xgeva monthly; Lupron every 3 months; Xofigo injections   STAGING: 05/22 CT A/P - bone mets; no LAD 05/22 bone scan - sacral bone mets    PSA jumped up to 230.    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    Blood pressure 101/61, pulse (!) 105, temperature 98.6 F (37 C), weight 255 lb 9.6 oz (115.9 kg). NED. A&Ox3.   No respiratory distress   Abd soft, NT, ND Normal phallus with bilateral descended testicles  Cystoscopy Procedure Note  Patient identification was confirmed, informed consent was obtained, and patient was prepped using Betadine solution.  Lidocaine jelly was administered per urethral meatus.     Pre-Procedure: - Inspection reveals a normal caliber ureteral meatus.  Procedure: The flexible cystoscope was introduced without difficulty - 8Fr flimsy membranous stricture the flex scope easily popped through  - prostate surgically absent  - normal bladder neck - Bilateral ureteral orifices identified - Bladder mucosa  reveals no ulcers, tumors, or lesions - No bladder stones - No trabeculation  Retroflexion shows normal bladder/bladder neck   Post-Procedure: - Patient tolerated the procedure well  Assessment/ Plan:  Frequency-disc OAB meds and other  therapies but he is not bothered and will continue surveillance. See back in 1 year or sooner if issues.   No follow-ups on file.  Festus Aloe, MD

## 2021-03-17 ENCOUNTER — Other Ambulatory Visit (HOSPITAL_COMMUNITY): Payer: Self-pay | Admitting: Radiation Oncology

## 2021-03-17 DIAGNOSIS — C61 Malignant neoplasm of prostate: Secondary | ICD-10-CM

## 2021-03-17 DIAGNOSIS — C7951 Secondary malignant neoplasm of bone: Secondary | ICD-10-CM

## 2021-03-18 ENCOUNTER — Other Ambulatory Visit: Payer: Self-pay

## 2021-03-18 ENCOUNTER — Inpatient Hospital Stay (HOSPITAL_COMMUNITY): Payer: Medicare Other

## 2021-03-18 ENCOUNTER — Inpatient Hospital Stay (HOSPITAL_COMMUNITY): Payer: Medicare Other | Attending: Hematology

## 2021-03-18 ENCOUNTER — Encounter (HOSPITAL_COMMUNITY): Payer: Self-pay

## 2021-03-18 DIAGNOSIS — C7951 Secondary malignant neoplasm of bone: Secondary | ICD-10-CM | POA: Diagnosis not present

## 2021-03-18 DIAGNOSIS — C61 Malignant neoplasm of prostate: Secondary | ICD-10-CM | POA: Insufficient documentation

## 2021-03-18 DIAGNOSIS — Z79899 Other long term (current) drug therapy: Secondary | ICD-10-CM | POA: Diagnosis not present

## 2021-03-18 LAB — CBC WITH DIFFERENTIAL/PLATELET
Abs Immature Granulocytes: 0.04 10*3/uL (ref 0.00–0.07)
Basophils Absolute: 0 10*3/uL (ref 0.0–0.1)
Basophils Relative: 0 %
Eosinophils Absolute: 0.1 10*3/uL (ref 0.0–0.5)
Eosinophils Relative: 2 %
HCT: 28.6 % — ABNORMAL LOW (ref 39.0–52.0)
Hemoglobin: 9.6 g/dL — ABNORMAL LOW (ref 13.0–17.0)
Immature Granulocytes: 1 %
Lymphocytes Relative: 14 %
Lymphs Abs: 0.8 10*3/uL (ref 0.7–4.0)
MCH: 32.2 pg (ref 26.0–34.0)
MCHC: 33.6 g/dL (ref 30.0–36.0)
MCV: 96 fL (ref 80.0–100.0)
Monocytes Absolute: 0.4 10*3/uL (ref 0.1–1.0)
Monocytes Relative: 7 %
Neutro Abs: 4.2 10*3/uL (ref 1.7–7.7)
Neutrophils Relative %: 76 %
Platelets: 128 10*3/uL — ABNORMAL LOW (ref 150–400)
RBC: 2.98 MIL/uL — ABNORMAL LOW (ref 4.22–5.81)
RDW: 14.5 % (ref 11.5–15.5)
WBC: 5.6 10*3/uL (ref 4.0–10.5)
nRBC: 0 % (ref 0.0–0.2)

## 2021-03-18 LAB — PSA: Prostatic Specific Antigen: 913 ng/mL — ABNORMAL HIGH (ref 0.00–4.00)

## 2021-03-18 LAB — COMPREHENSIVE METABOLIC PANEL
ALT: 21 U/L (ref 0–44)
AST: 25 U/L (ref 15–41)
Albumin: 3 g/dL — ABNORMAL LOW (ref 3.5–5.0)
Alkaline Phosphatase: 166 U/L — ABNORMAL HIGH (ref 38–126)
Anion gap: 7 (ref 5–15)
BUN: 33 mg/dL — ABNORMAL HIGH (ref 8–23)
CO2: 22 mmol/L (ref 22–32)
Calcium: 8.2 mg/dL — ABNORMAL LOW (ref 8.9–10.3)
Chloride: 108 mmol/L (ref 98–111)
Creatinine, Ser: 1.23 mg/dL (ref 0.61–1.24)
GFR, Estimated: >60 mL/min (ref >60)
Glucose, Bld: 179 mg/dL — ABNORMAL HIGH (ref 70–99)
Potassium: 4.3 mmol/L (ref 3.5–5.1)
Sodium: 137 mmol/L (ref 135–145)
Total Bilirubin: 0.6 mg/dL (ref 0.3–1.2)
Total Protein: 6.2 g/dL — ABNORMAL LOW (ref 6.5–8.1)

## 2021-03-18 MED ORDER — SODIUM CHLORIDE 0.9% FLUSH
10.0000 mL | Freq: Once | INTRAVENOUS | Status: AC
  Administered 2021-03-18: 10 mL via INTRAVENOUS

## 2021-03-18 MED ORDER — DENOSUMAB 120 MG/1.7ML ~~LOC~~ SOLN
120.0000 mg | Freq: Once | SUBCUTANEOUS | Status: AC
  Administered 2021-03-18: 120 mg via SUBCUTANEOUS
  Filled 2021-03-18: qty 1.7

## 2021-03-18 MED ORDER — HEPARIN SOD (PORK) LOCK FLUSH 100 UNIT/ML IV SOLN
500.0000 [IU] | Freq: Once | INTRAVENOUS | Status: AC
  Administered 2021-03-18: 500 [IU] via INTRAVENOUS

## 2021-03-18 NOTE — Patient Instructions (Signed)
[facility]  Discharge Instructions: Thank you for choosing [facility] to provide your oncology and hematology care.  If you have a lab appointment with the [facility], please come in thru the Main Entrance and check in at the main information desk.  Wear comfortable clothing and clothing appropriate for easy access to any Portacath or PICC line.   We strive to give you quality time with your provider. You may need to reschedule your appointment if you arrive late (15 or more minutes).  Arriving late affects you and other patients whose appointments are after yours.  Also, if you miss three or more appointments without notifying the office, you may be dismissed from the clinic at the provider's discretion.      For prescription refill requests, have your pharmacy contact our office and allow 72 hours for refills to be completed.    Today you received the following: Xgeva injection.      To help prevent nausea and vomiting after your treatment, we encourage you to take your nausea medication as directed.  BELOW ARE SYMPTOMS THAT SHOULD BE REPORTED IMMEDIATELY: *FEVER GREATER THAN 100.4 F (38 C) OR HIGHER *CHILLS OR SWEATING *NAUSEA AND VOMITING THAT IS NOT CONTROLLED WITH YOUR NAUSEA MEDICATION *UNUSUAL SHORTNESS OF BREATH *UNUSUAL BRUISING OR BLEEDING *URINARY PROBLEMS (pain or burning when urinating, or frequent urination) *BOWEL PROBLEMS (unusual diarrhea, constipation, pain near the anus) TENDERNESS IN MOUTH AND THROAT WITH OR WITHOUT PRESENCE OF ULCERS (sore throat, sores in mouth, or a toothache) UNUSUAL RASH, SWELLING OR PAIN  UNUSUAL VAGINAL DISCHARGE OR ITCHING   Items with * indicate a potential emergency and should be followed up as soon as possible or go to the Emergency Department if any problems should occur.  Please show the CHEMOTHERAPY ALERT CARD or IMMUNOTHERAPY ALERT CARD at check-in to the Emergency Department and triage nurse.  Should  you have questions after your visit or need to cancel or reschedule your appointment, please contact [facility] [phone]  and follow the prompts.  Office hours are 8:00 a.m. to 4:30 p.m. Monday - Friday. Please note that voicemails left after 4:00 p.m. may not be returned until the following business day.  We are closed weekends and major holidays. You have access to a nurse at all times for urgent questions. Please call the main number to the clinic [phone] and follow the prompts.  For any non-urgent questions, you may also contact your provider using MyChart. We now offer e-Visits for anyone 28 and older to request care online for non-urgent symptoms. For details visit [email or web address].   Also download the MyChart app! Go to the app store, search "MyChart", open the app, select [facility], and log in with your MyChart username and password.  Due to Covid, a mask is required upon entering the hospital/clinic. If you do not have a mask, one will be given to you upon arrival. For doctor visits, patients may have 1 support person aged 21 or older with them. For treatment visits, patients cannot have anyone with them due to current Covid guidelines and our immunocompromised population.

## 2021-03-18 NOTE — Progress Notes (Signed)
Hunter Jones presents today for injection per the provider's orders.  Xgeva administration without incident; injection site WNL; see MAR for injection details.  Patient tolerated procedure well and without incident.  No questions or complaints noted at this time. Treatment given today per MD orders. Discharged from clinic ambulatory in stable condition. Alert and oriented x 3. F/U with [facility] as scheduled.

## 2021-03-19 NOTE — Progress Notes (Signed)
  Radiation Oncology         [phone] ________________________________  Name: ABDULKAREEM BADOLATO MRN: [id number]  Date: 03/13/2021  DOB: Jul 09, 1955  Radium-223 Infusion Note  Diagnosis:  Castration resistant prostate cancer with painful bone involvement  Current Infusion:    4  Planned Infusions:  6  Narrative: Hunter Jones presented to nuclear medicine for treatment. His most recent blood counts were reviewed.  He remains a good candidate to proceed with Ra-223.  The patient was situated in an infusion suite with a contact barrier placed under his arm. Intravenous access was established, using sterile technique, and a normal saline infusion from a syringe was started.  Micro-dosimetry:  The prescribed radiation activity was assayed and confirmed to be within specified tolerance.  Special Treatment Procedure - Infusion:  The nuclear medicine technologist and I personally verified the dose activity to be delivered as specified in the written directive, and verified the patient identification via 2 separate methods.  The syringe containing the dose was attached to an intravenous access and the dose delivered over a minute. No complications were noted.  The total administered dose was 182.1 microcuries.   A saline flush of the line and the syringe that contained the isotope was then performed.  The residual radioactivity in the syringe was 3.87 microcuries, so the actual infused isotope activity was 178.23 microcuries.   Pressure was applied to the venipuncture site, and a compression bandage placed.   Radiation Safety personnel were present to perform the discharge survey, as detailed on their documentation.   After a short period of observation, the patient had his IV removed.  Impression:  The patient tolerated his infusion relatively well.  Plan:  The patient will return in one month for ongoing care.    ________________________________  Sheral Apley. Tammi Klippel, M.D.

## 2021-03-30 ENCOUNTER — Other Ambulatory Visit: Payer: Self-pay

## 2021-03-30 DIAGNOSIS — C61 Malignant neoplasm of prostate: Secondary | ICD-10-CM

## 2021-04-03 ENCOUNTER — Telehealth: Payer: Self-pay | Admitting: *Deleted

## 2021-04-03 NOTE — Telephone Encounter (Signed)
CALLED PATIENT TO REMIND OF LAB AND WEIGHT FOR 04-06-21, (LAB TO BE DONE @ [facility], LVM FOR A RETURN CALL

## 2021-04-06 ENCOUNTER — Other Ambulatory Visit: Payer: Self-pay

## 2021-04-06 ENCOUNTER — Other Ambulatory Visit (HOSPITAL_COMMUNITY)
Admission: RE | Admit: 2021-04-06 | Discharge: 2021-04-06 | Disposition: A | Discharge status: Home / Self Care | Payer: Medicare Other | Source: Ambulatory Visit | Attending: Radiation Oncology | Admitting: Radiation Oncology

## 2021-04-06 DIAGNOSIS — C61 Malignant neoplasm of prostate: Secondary | ICD-10-CM | POA: Diagnosis present

## 2021-04-06 LAB — CBC WITH DIFFERENTIAL/PLATELET
Abs Immature Granulocytes: 0.11 10*3/uL — ABNORMAL HIGH (ref 0.00–0.07)
Basophils Absolute: 0 10*3/uL (ref 0.0–0.1)
Basophils Relative: 0 %
Eosinophils Absolute: 0.1 10*3/uL (ref 0.0–0.5)
Eosinophils Relative: 1 %
HCT: 29.7 % — ABNORMAL LOW (ref 39.0–52.0)
Hemoglobin: 9.8 g/dL — ABNORMAL LOW (ref 13.0–17.0)
Immature Granulocytes: 2 %
Lymphocytes Relative: 14 %
Lymphs Abs: 1 10*3/uL (ref 0.7–4.0)
MCH: 31.3 pg (ref 26.0–34.0)
MCHC: 33 g/dL (ref 30.0–36.0)
MCV: 94.9 fL (ref 80.0–100.0)
Monocytes Absolute: 0.8 10*3/uL (ref 0.1–1.0)
Monocytes Relative: 11 %
Neutro Abs: 5.2 10*3/uL (ref 1.7–7.7)
Neutrophils Relative %: 72 %
Platelets: 143 10*3/uL — ABNORMAL LOW (ref 150–400)
RBC: 3.13 MIL/uL — ABNORMAL LOW (ref 4.22–5.81)
RDW: 15.7 % — ABNORMAL HIGH (ref 11.5–15.5)
WBC: 7.2 10*3/uL (ref 4.0–10.5)
nRBC: 0 % (ref 0.0–0.2)

## 2021-04-10 ENCOUNTER — Telehealth: Payer: Self-pay | Admitting: *Deleted

## 2021-04-10 NOTE — Telephone Encounter (Signed)
CALLED PATIENT TO REMIND OF XOFIGO INJ. FOR 04-13-21- ARRIVAL TIME- 11:15 AM @ [facility], SPOKE WITH PATIENT AND HE IS AWARE OF THIS INJ.

## 2021-04-13 ENCOUNTER — Encounter (HOSPITAL_COMMUNITY)
Admission: RE | Admit: 2021-04-13 | Discharge: 2021-04-13 | Disposition: A | Discharge status: Home / Self Care | Payer: Medicare Other | Source: Ambulatory Visit | Attending: Radiation Oncology | Admitting: Radiation Oncology

## 2021-04-13 ENCOUNTER — Other Ambulatory Visit (HOSPITAL_COMMUNITY): Payer: Self-pay | Admitting: Radiation Oncology

## 2021-04-13 ENCOUNTER — Other Ambulatory Visit: Payer: Self-pay

## 2021-04-13 DIAGNOSIS — C7952 Secondary malignant neoplasm of bone marrow: Secondary | ICD-10-CM | POA: Diagnosis present

## 2021-04-13 DIAGNOSIS — C7951 Secondary malignant neoplasm of bone: Secondary | ICD-10-CM | POA: Diagnosis present

## 2021-04-13 DIAGNOSIS — C61 Malignant neoplasm of prostate: Secondary | ICD-10-CM

## 2021-04-13 NOTE — Progress Notes (Signed)
  Radiation Oncology         [phone] ________________________________  Name: Hunter Jones MRN: [id number]  Date: 04/13/2021  DOB: 30-Dec-1955  Radium-223 Infusion Note  Diagnosis:  Castration resistant prostate cancer with painful bone involvement  Current Infusion:    5  Planned Infusions:  6  Narrative: Hunter Jones presented to nuclear medicine for treatment. His most recent blood counts were reviewed.  He remains a good candidate to proceed with Ra-223.  The patient was situated in an infusion suite with a contact barrier placed under his arm. Intravenous access was established, using sterile technique, and a normal saline infusion from a syringe was started.  Micro-dosimetry:  The prescribed radiation activity was assayed and confirmed to be within specified tolerance.  Special Treatment Procedure - Infusion:  The nuclear medicine technologist and I personally verified the dose activity to be delivered as specified in the written directive, and verified the patient identification via 2 separate methods.  The syringe containing the dose was attached to an intravenous access and the dose delivered over a minute. No complications were noted.  The total administered dose was 163.8 microcuries.   A saline flush of the line and the syringe that contained the isotope was then performed.  The residual radioactivity in the syringe was 3.3 microcuries, so the actual infused isotope activity was 160.5 microcuries.   Pressure was applied to the venipuncture site, and a compression bandage placed.   Radiation Safety personnel were present to perform the discharge survey, as detailed on their documentation.   After a short period of observation, the patient had his IV removed.  Impression:  The patient tolerated his infusion relatively well.  Plan:  The patient will return in one month for ongoing care.    ________________________________  Sheral Apley. Tammi Klippel, M.D.

## 2021-04-13 NOTE — Addendum Note (Signed)
Encounter addended by: Greer Pickerel on: 04/13/2021 1:02 PM  Actions taken: Imaging Exam ended

## 2021-04-14 ENCOUNTER — Encounter: Payer: Self-pay | Admitting: Family Medicine

## 2021-04-14 ENCOUNTER — Ambulatory Visit (INDEPENDENT_AMBULATORY_CARE_PROVIDER_SITE_OTHER): Payer: Medicare Other | Admitting: Family Medicine

## 2021-04-14 ENCOUNTER — Other Ambulatory Visit: Payer: Self-pay | Admitting: *Deleted

## 2021-04-14 VITALS — BP 102/58 | HR 84 | Ht 70.5 in | Wt 245.8 lb

## 2021-04-14 DIAGNOSIS — R5383 Other fatigue: Secondary | ICD-10-CM

## 2021-04-14 DIAGNOSIS — E782 Mixed hyperlipidemia: Secondary | ICD-10-CM

## 2021-04-14 DIAGNOSIS — Z8639 Personal history of other endocrine, nutritional and metabolic disease: Secondary | ICD-10-CM | POA: Diagnosis not present

## 2021-04-14 DIAGNOSIS — I2510 Atherosclerotic heart disease of native coronary artery without angina pectoris: Secondary | ICD-10-CM

## 2021-04-14 DIAGNOSIS — D649 Anemia, unspecified: Secondary | ICD-10-CM

## 2021-04-14 DIAGNOSIS — R000 Tachycardia, unspecified: Secondary | ICD-10-CM

## 2021-04-14 MED ORDER — METOPROLOL SUCCINATE ER 25 MG PO TB24: 25.0000 mg | ORAL_TABLET | ORAL | 3 refills | Status: DC

## 2021-04-14 NOTE — Progress Notes (Signed)
Cardiology Office Note  Date: 04/14/2021   ID: JOLLY BLEICHER, DOB 1956/06/14, MRN [id number]  PCP:  Monico Blitz, MD  Cardiologist:  Rozann Lesches, MD Electrophysiologist:  None   Chief Complaint: Getting very fatigued with exertion, racing heart.  History of Present Illness: Hunter Jones is a 65 y.o. male with a history of COPD, CAD, STEMI/NSTEMI/DM2, hyperthyroidism, prostate CA, HTN,  He was last seen by Dr. Domenic Polite on February 04, 2021.  He had no active anginal symptoms or nitroglycerin use.  He was continuing treatments for prostate cancer.  Previous Myoview with inferolateral scar with mild peri-infarct ischemia, EF calculated 39%, echocardiogram demonstrated EF of 50 to 55%.  Since he was a year out from last DES placement plan was to stop Plavix when bottle was empty.  Plan was to stay on aspirin 81 mg daily.  Interval lab work from Dr. Manuella Ghazi was requested  He is here today with complaint of increasing fatigue with increasing DOE.  He has been receiving Xofigo infusions with hematology/oncology.  Also states he has been receiving radiation treatments.  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  His hemoglobin and hematocrit have been steadily decreasing over time.  3 months ago on 01/13/2021 his hemoglobin was 12.7.  On October 24 hemoglobin was 9.8.  3 months ago his hematocrit was 37.3 and recent hematocrit 29.7 on 04/06/2021.  He is receiving no iron supplementation for anemia.  MCV 94.9, MCH 31.3, MCHC 33.  RDW of 15.7.  Platelets 143.  No other recent iron indices.  He denies any black tarry stools or blood in stools.  Denies any hematuria.  States he has been feeling somewhat orthostatic when going from a lying to sitting to standing position.  His blood pressure has been in the low 100s sometimes dipping below 100s to the 90s.  He is lost between 60 and 70 pounds over the recent few months.  He continues on Toprol-XL blood  pressure today is 102/58.  He states he has been walking his dog on a regular basis and notices increasing activity intolerance with some increasing shortness of breath.  He does have a history of hyperthyroidism on methimazole.  States he has had no recent thyroid checks.  He states he takes vitamin D and vitamin B12 at home as supplements.    Past Medical History:  Diagnosis Date   Acute ST elevation myocardial infarction (STEMI) of inferior wall ([facility]) 11/2019   Asthma    COPD (chronic obstructive pulmonary disease) (HCC)    Coronary artery disease    DES to mid LAD August 2014 ([facility]); overlapping DESx2 prox-mid RCA 11/2019   Essential hypertension    Hyperthyroidism    NSTEMI (non-ST elevated myocardial infarction) ([facility]) 2014   Port-A-Cath in place 06/17/2020   Prostate cancer ([facility])    Type 2 diabetes mellitus ([facility])     Past Surgical History:  Procedure Laterality Date   APPENDECTOMY     CORONARY/GRAFT ACUTE MI REVASCULARIZATION N/A 12/10/2019   Procedure: Coronary/Graft Acute MI Revascularization;  Surgeon: Nelva Bush, MD;  Location: [facility];  Service: Cardiovascular;  Laterality: N/A;   IR IMAGING GUIDED PORT INSERTION  06/19/2020   LEFT HEART CATH AND CORONARY ANGIOGRAPHY N/A 12/10/2019   Procedure: LEFT HEART CATH AND CORONARY ANGIOGRAPHY;  Surgeon: Nelva Bush, MD;  Location: [facility];  Service: Cardiovascular;  Laterality: N/A;   LYMPHADENECTOMY Bilateral 06/27/2014  Procedure: LYMPHADENECTOMY;  Surgeon: Raynelle Bring, MD;  Location: [facility];  Service: Urology;  Laterality: Bilateral;   ROBOT ASSISTED LAPAROSCOPIC RADICAL PROSTATECTOMY N/A 06/27/2014   Procedure: ROBOTIC ASSISTED LAPAROSCOPIC RADICAL PROSTATECTOMY LEVEL 3;  Surgeon: Raynelle Bring, MD;  Location: [facility];  Service: Urology;  Laterality: N/A;   TONSILLECTOMY      Current Outpatient Medications  Medication Sig Dispense Refill   albuterol (PROVENTIL HFA;VENTOLIN HFA) 108 (90 BASE)  MCG/ACT inhaler Inhale 1-2 puffs into the lungs every 6 (six) hours as needed for wheezing or shortness of breath.     Ascorbic Acid (VITAMIN C PO) Take 1 tablet by mouth daily.     aspirin EC 81 MG tablet Take 81 mg by mouth 3 (three) times a week.     atorvastatin (LIPITOR) 40 MG tablet TAKE 1 TABLET BY MOUTH AT BEDTIME. (Patient taking differently: Take 40 mg by mouth at bedtime.) 30 tablet 6   Calcium Carb-Cholecalciferol (HM CALCIUM-VITAMIN D) 600-800 MG-UNIT TABS Take 1 tablet by mouth 2 (two) times daily. 1800 mg per day     Coenzyme Q10 (COQ10) 200 MG CAPS Take 200 mg by mouth daily.     denosumab (XGEVA) 120 MG/1.7ML SOLN injection Inject 120 mg into the skin every 30 (thirty) days. Last received 11/07/20     fluticasone-salmeterol (ADVAIR HFA) 115-21 MCG/ACT inhaler Inhale 2 puffs into the lungs 2 (two) times daily.      Ibuprofen-diphenhydrAMINE HCl (IBUPROFEN PM) 200-25 MG CAPS Take 1 tablet by mouth at bedtime as needed (sleep).     KRILL OIL PO Take 1 capsule by mouth daily.     Leuprolide Acetate, 3 Month, (LUPRON DEPOT, 26-MONTH, IM) Inject 1 Dose into the muscle every 3 (three) months.     lidocaine-prilocaine (EMLA) cream Apply a small amount to port a cath site and cover with plastic wrap 1 hour prior to chemotherapy appointments 30 g 3   Lutein 20 MG CAPS Take 20 mg by mouth daily.     methimazole (TAPAZOLE) 10 MG tablet Take 10 mg by mouth every other day.      Multiple Vitamin (MULTIVITAMIN) tablet Take 1 tablet by mouth daily.     nitroGLYCERIN (NITROSTAT) 0.4 MG SL tablet Place 1 tablet (0.4 mg total) under the tongue every 5 (five) minutes as needed. 25 tablet 12   Potassium 99 MG TABS Take 99 mg by mouth daily.     predniSONE (DELTASONE) 5 MG tablet Take 1 tablet (5 mg total) by mouth daily with breakfast. 60 tablet 11   Radium Ra 223 Dichloride (XOFIGO IV) Inject into the vein.     Trolamine Salicylate (ASPERCREME EX) Apply 1 application topically daily as needed (pain).      metoprolol succinate (TOPROL-XL) 25 MG 24 hr tablet Take 1 tablet (25 mg total) by mouth every other day. 45 tablet 3   No current facility-administered medications for this visit.   Allergies:  Xtandi [enzalutamide] and Penicillins   Social History: The patient  reports that he quit smoking about 8 years ago. His smoking use included cigarettes. He has a 40.00 pack-year smoking history. He has never used smokeless tobacco. He reports current alcohol use. He reports that he does not use drugs.   Family History: The patient's family history includes Alzheimer's disease in his maternal uncle; Clotting disorder in his mother; Dementia in his mother; Heart attack in his cousin and maternal uncle; Kidney Stones in his father; Stroke in his brother; Thyroid disease in his  mother; Tuberculosis in his paternal grandmother.   ROS:  Please see the history of present illness. Otherwise, complete review of systems is positive for none.  All other systems are reviewed and negative.   Physical Exam: VS:  BP (!) 102/58   Pulse 84   Ht 5' 10.5" (1.791 m)   Wt 245 lb 12.8 oz (111.5 kg)   SpO2 95%   BMI 34.77 kg/m , BMI Body mass index is 34.77 kg/m.  Wt Readings from Last 3 Encounters:  04/14/21 245 lb 12.8 oz (111.5 kg)  03/16/21 255 lb 9.6 oz (115.9 kg)  02/18/21 255 lb (115.7 kg)    General: Patient appears comfortable at rest. Neck: Supple, no elevated JVP or carotid bruits, no thyromegaly. Lungs: Clear to auscultation, nonlabored breathing at rest. Cardiac: Regular rate and rhythm, no S3 or significant systolic murmur, no pericardial rub. Extremities: No pitting edema, distal pulses 2+. Skin: Warm and dry. Musculoskeletal: No kyphosis. Neuropsychiatric: Alert and oriented x3, affect grossly appropriate.  ECG:    Recent Labwork: 01/13/2021: Magnesium 1.7 03/18/2021: ALT 21; AST 25; BUN 33; Creatinine, Ser 1.23; Potassium 4.3; Sodium 137 04/06/2021: Hemoglobin 9.8; Platelets 143      Component Value Date/Time   CHOL 68 12/10/2019 2351   TRIG 91 12/10/2019 2351   HDL 28 (L) 12/10/2019 2351   CHOLHDL 2.4 12/10/2019 2351   VLDL 18 12/10/2019 2351   LDLCALC 22 12/10/2019 2351    Other Studies Reviewed Today:  Lexiscan Myoview 09/08/2020: No diagnostic ST segment changes to indicate ischemia. Moderate intensity, apical to basal inferolateral defect that is partially reversible suggesting scar with mild peri-infarct ischemia. There is also radiotracer uptake within the gut adjacent to the inferior wall on rest imaging which could be causing artifactual reversibility in this area. This is an intermediate risk study, based on calculated LVEF. Suggest echocardiogram to clarify if not performed recently. Nuclear stress EF: 39%.   Echocardiogram 09/10/2020:  1. Left ventricular ejection fraction, by estimation, is 50 to 55%. The  left ventricle has low normal function. The left ventricle demonstrates  regional wall motion abnormalities (see scoring diagram/findings for  description). The left ventricular  internal cavity size was mildly dilated. There is mild left ventricular  hypertrophy. Left ventricular diastolic parameters were normal.   2. Right ventricular systolic function is normal. The right ventricular  size is normal. Tricuspid regurgitation signal is inadequate for assessing  PA pressure.   3. Left atrial size was mildly dilated.   4. The mitral valve is grossly normal. Trivial mitral valve  regurgitation.   5. The aortic valve is tricuspid. Aortic valve regurgitation is mild.  Mild aortic valve sclerosis is present, with no evidence of aortic valve  stenosis.   6. The inferior vena cava is normal in size with greater than 50%  respiratory variability, suggesting right atrial pressure of 3 mmHg.    Left heart cath 12/10/2019 Conclusions: Severe single-vessel coronary artery disease with thrombotic occlusion of the proximal RCA. Moderate proximal RCA and  mid LCx disease. Widely patent overlapping LAD stents. Upper normal left ventricular filling pressure. Successful PCI to proximal/mid RCA using overlapping Resolute Onyx 3.5 x 18 mm and 3.0 x 34 mm drug-eluting stents with 0% residual stenosis and TIMI-3 flow.   Recommendations: Complete two hours of cangrelor. Dual antiplatelet therapy with aspirin and ticagrelor for at least 12 months. Aggressive secondary prevention. Obtain echocardiogram to assess left ventricular systolic function. Diagnostic Dominance: Right Intervention  Assessment and Plan:  1. Fatigue, unspecified type   2. Racing heart beat   3. CAD in native artery   4. History of hyperthyroidism   5. Mixed hyperlipidemia   6. Anemia, unspecified type    1. Fatigue, unspecified type Creasing fatigue recently.  He is undergoing chemo and radiation treatment for his prostate cancer.  He is receiving Xofigo infusions.  Recently had an infusion. His hemoglobin and hematocrit have been steadily decreasing over time.  3 months ago on 01/13/2021 his hemoglobin was 12.7.  On October 24 hemoglobin was 9.8.  3 months ago his hematocrit was 37.3 and recent hematocrit 29.7 on 04/06/2021.  He is receiving no iron supplementation for anemia.  MCV 94.9, MCH 31.3, MCHC 33.  RDW of 15.7.  Platelets 143.  No other recent iron indices.  He denies any black tarry stools or blood in stools.  Denies any hematuria.  States he has been feeling somewhat orthostatic when going from a lying to sitting to standing position.  His blood pressure has been in the low 100s sometimes dipping below 100s to the 90s.  Change Toprol-XL to 25 mg every other day for fatigue, increased DOE, and mild dizziness with position changes.  He has lost a significant amount of weight between 60 and 70 pounds over the last few months which may require less dosage of Toprol.  States he recently had Toprol refilled and would rather not take a half a pill so we opted for  taking Toprol every other day.   2. Racing heart beat States he notices heart rate increasing when he walks his dog which he does on a regular basis.  Notes increasing activity intolerance and fatigue.    3. CAD in native artery He denies any anginal symptoms.  Continue aspirin 81 mg daily.  Continue sublingual nitroglycerin as needed  4. History of hyperthyroidism History of high parathyroidism on methimazole 10 mg every other day.  Please get a follow-up TSH, T3, T4.  5. Mixed hyperlipidemia Continue atorvastatin 40 mg p.o. daily.  6. Anemia, unspecified type  His hemoglobin and hematocrit have been steadily decreasing over time.  3 months ago on 01/13/2021 his hemoglobin was 12.7.  On October 24 hemoglobin was 9.8.  3 months ago his hematocrit was 37.3 and recent hematocrit 29.7 on 04/06/2021.  He is receiving no iron supplementation for anemia.  MCV 94.9, MCH 31.3, MCHC 33.  RDW of 15.7.  Platelets 143.  No other recent iron indices.  He denies any black tarry stools or blood in stools.  Denies any hematuria.  He states he believes its due to his cancer therapy He states he recently mentioned  anemia to his oncologist who did not seem concerned regarding current anemia.  Medication Adjustments/Labs and Tests Ordered: Current medicines are reviewed at length with the patient today.  Concerns regarding medicines are outlined above.   Disposition: Follow-up with Dr Domenic Polite or APP 3 months  Signed, Levell July, NP 04/14/2021 5:22 PM    [facility] [place], [place], [place] [address] Phone: [phone]; Fax: [phone]

## 2021-04-14 NOTE — Patient Instructions (Addendum)
Medication Instructions:  Your physician has recommended you make the following change in your medication:  Decrease metoprolol succinate 25 mg to every other day Continue other medications the same  Labwork: TSH, Free T3 & T4 [facility] or [facility]  Testing/Procedures: none  Follow-Up: Your physician recommends that you schedule a follow-up appointment in: 3 months   Any Other Special Instructions Will Be Listed Below (If Applicable).  If you need a refill on your cardiac medications before your next appointment, please call your pharmacy.

## 2021-04-15 ENCOUNTER — Inpatient Hospital Stay (HOSPITAL_COMMUNITY): Payer: Medicare Other

## 2021-04-15 ENCOUNTER — Inpatient Hospital Stay (HOSPITAL_COMMUNITY): Payer: Medicare Other | Attending: Hematology

## 2021-04-15 ENCOUNTER — Other Ambulatory Visit: Payer: Self-pay

## 2021-04-15 DIAGNOSIS — C7951 Secondary malignant neoplasm of bone: Secondary | ICD-10-CM | POA: Insufficient documentation

## 2021-04-15 DIAGNOSIS — I252 Old myocardial infarction: Secondary | ICD-10-CM | POA: Diagnosis not present

## 2021-04-15 DIAGNOSIS — Z7902 Long term (current) use of antithrombotics/antiplatelets: Secondary | ICD-10-CM | POA: Diagnosis not present

## 2021-04-15 DIAGNOSIS — R634 Abnormal weight loss: Secondary | ICD-10-CM | POA: Diagnosis not present

## 2021-04-15 DIAGNOSIS — C61 Malignant neoplasm of prostate: Secondary | ICD-10-CM | POA: Diagnosis present

## 2021-04-15 DIAGNOSIS — E119 Type 2 diabetes mellitus without complications: Secondary | ICD-10-CM | POA: Insufficient documentation

## 2021-04-15 DIAGNOSIS — I2510 Atherosclerotic heart disease of native coronary artery without angina pectoris: Secondary | ICD-10-CM | POA: Insufficient documentation

## 2021-04-15 DIAGNOSIS — M25559 Pain in unspecified hip: Secondary | ICD-10-CM | POA: Diagnosis not present

## 2021-04-15 DIAGNOSIS — M25512 Pain in left shoulder: Secondary | ICD-10-CM | POA: Diagnosis not present

## 2021-04-15 DIAGNOSIS — R110 Nausea: Secondary | ICD-10-CM | POA: Diagnosis not present

## 2021-04-15 DIAGNOSIS — Z87891 Personal history of nicotine dependence: Secondary | ICD-10-CM | POA: Diagnosis not present

## 2021-04-15 DIAGNOSIS — Z5189 Encounter for other specified aftercare: Secondary | ICD-10-CM | POA: Insufficient documentation

## 2021-04-15 DIAGNOSIS — R5383 Other fatigue: Secondary | ICD-10-CM | POA: Diagnosis not present

## 2021-04-15 DIAGNOSIS — R197 Diarrhea, unspecified: Secondary | ICD-10-CM | POA: Diagnosis not present

## 2021-04-15 DIAGNOSIS — R0609 Other forms of dyspnea: Secondary | ICD-10-CM | POA: Insufficient documentation

## 2021-04-15 DIAGNOSIS — M25511 Pain in right shoulder: Secondary | ICD-10-CM | POA: Diagnosis not present

## 2021-04-15 DIAGNOSIS — R102 Pelvic and perineal pain: Secondary | ICD-10-CM | POA: Diagnosis not present

## 2021-04-15 DIAGNOSIS — Z79899 Other long term (current) drug therapy: Secondary | ICD-10-CM | POA: Insufficient documentation

## 2021-04-15 LAB — CBC WITH DIFFERENTIAL/PLATELET
Abs Immature Granulocytes: 0.11 10*3/uL — ABNORMAL HIGH (ref 0.00–0.07)
Basophils Absolute: 0 10*3/uL (ref 0.0–0.1)
Basophils Relative: 0 %
Eosinophils Absolute: 0.1 10*3/uL (ref 0.0–0.5)
Eosinophils Relative: 1 %
HCT: 24.3 % — ABNORMAL LOW (ref 39.0–52.0)
Hemoglobin: 8.1 g/dL — ABNORMAL LOW (ref 13.0–17.0)
Immature Granulocytes: 2 %
Lymphocytes Relative: 11 %
Lymphs Abs: 0.5 10*3/uL — ABNORMAL LOW (ref 0.7–4.0)
MCH: 31.9 pg (ref 26.0–34.0)
MCHC: 33.3 g/dL (ref 30.0–36.0)
MCV: 95.7 fL (ref 80.0–100.0)
Monocytes Absolute: 0.4 10*3/uL (ref 0.1–1.0)
Monocytes Relative: 8 %
Neutro Abs: 3.8 10*3/uL (ref 1.7–7.7)
Neutrophils Relative %: 78 %
Platelets: 126 10*3/uL — ABNORMAL LOW (ref 150–400)
RBC: 2.54 MIL/uL — ABNORMAL LOW (ref 4.22–5.81)
RDW: 15.8 % — ABNORMAL HIGH (ref 11.5–15.5)
WBC: 4.9 10*3/uL (ref 4.0–10.5)
nRBC: 0.6 % — ABNORMAL HIGH (ref 0.0–0.2)

## 2021-04-15 LAB — COMPREHENSIVE METABOLIC PANEL
ALT: 63 U/L — ABNORMAL HIGH (ref 0–44)
AST: 44 U/L — ABNORMAL HIGH (ref 15–41)
Albumin: 2.8 g/dL — ABNORMAL LOW (ref 3.5–5.0)
Alkaline Phosphatase: 199 U/L — ABNORMAL HIGH (ref 38–126)
Anion gap: 6 (ref 5–15)
BUN: 33 mg/dL — ABNORMAL HIGH (ref 8–23)
CO2: 22 mmol/L (ref 22–32)
Calcium: 8.9 mg/dL (ref 8.9–10.3)
Chloride: 108 mmol/L (ref 98–111)
Creatinine, Ser: 1.2 mg/dL (ref 0.61–1.24)
GFR, Estimated: >60 mL/min (ref >60)
Glucose, Bld: 160 mg/dL — ABNORMAL HIGH (ref 70–99)
Potassium: 4.3 mmol/L (ref 3.5–5.1)
Sodium: 136 mmol/L (ref 135–145)
Total Bilirubin: 0.4 mg/dL (ref 0.3–1.2)
Total Protein: 6.1 g/dL — ABNORMAL LOW (ref 6.5–8.1)

## 2021-04-15 LAB — T4, FREE: Free T4: 1.19 ng/dL — ABNORMAL HIGH (ref 0.61–1.12)

## 2021-04-15 LAB — TSH: TSH: 0.96 u[IU]/mL (ref 0.350–4.500)

## 2021-04-15 LAB — PSA: Prostatic Specific Antigen: 1659 ng/mL — ABNORMAL HIGH (ref 0.00–4.00)

## 2021-04-15 MED ORDER — SODIUM CHLORIDE 0.9% FLUSH
10.0000 mL | INTRAVENOUS | Status: DC | PRN
Start: 2021-04-15 — End: 2021-04-15
  Administered 2021-04-15: 10 mL via INTRAVENOUS

## 2021-04-15 MED ORDER — HEPARIN SOD (PORK) LOCK FLUSH 100 UNIT/ML IV SOLN
500.0000 [IU] | Freq: Once | INTRAVENOUS | Status: AC
  Administered 2021-04-15: 500 [IU] via INTRAVENOUS

## 2021-04-15 MED ORDER — DENOSUMAB 120 MG/1.7ML ~~LOC~~ SOLN
120.0000 mg | Freq: Once | SUBCUTANEOUS | Status: AC
  Administered 2021-04-15: 120 mg via SUBCUTANEOUS
  Filled 2021-04-15: qty 1.7

## 2021-04-15 NOTE — Patient Instructions (Signed)
[facility]  Discharge Instructions: Thank you for choosing [facility] to provide your oncology and hematology care.  If you have a lab appointment with the Cancer Center, please come in thru the Main Entrance and check in at the main information desk.  Wear comfortable clothing and clothing appropriate for easy access to any Portacath or PICC line.   We strive to give you quality time with your provider. You may need to reschedule your appointment if you arrive late (15 or more minutes).  Arriving late affects you and other patients whose appointments are after yours.  Also, if you miss three or more appointments without notifying the office, you may be dismissed from the clinic at the provider's discretion.      For prescription refill requests, have your pharmacy contact our office and allow 72 hours for refills to be completed.        To help prevent nausea and vomiting after your treatment, we encourage you to take your nausea medication as directed.  BELOW ARE SYMPTOMS THAT SHOULD BE REPORTED IMMEDIATELY: *FEVER GREATER THAN 100.4 F (38 C) OR HIGHER *CHILLS OR SWEATING *NAUSEA AND VOMITING THAT IS NOT CONTROLLED WITH YOUR NAUSEA MEDICATION *UNUSUAL SHORTNESS OF BREATH *UNUSUAL BRUISING OR BLEEDING *URINARY PROBLEMS (pain or burning when urinating, or frequent urination) *BOWEL PROBLEMS (unusual diarrhea, constipation, pain near the anus) TENDERNESS IN MOUTH AND THROAT WITH OR WITHOUT PRESENCE OF ULCERS (sore throat, sores in mouth, or a toothache) UNUSUAL RASH, SWELLING OR PAIN  UNUSUAL VAGINAL DISCHARGE OR ITCHING   Items with * indicate a potential emergency and should be followed up as soon as possible or go to the Emergency Department if any problems should occur.  Please show the CHEMOTHERAPY ALERT CARD or IMMUNOTHERAPY ALERT CARD at check-in to the Emergency Department and triage nurse.  Should you have questions after your visit or need to cancel  or reschedule your appointment, please contact [facility] [phone]  and follow the prompts.  Office hours are 8:00 a.m. to 4:30 p.m. Monday - Friday. Please note that voicemails left after 4:00 p.m. may not be returned until the following business day.  We are closed weekends and major holidays. You have access to a nurse at all times for urgent questions. Please call the main number to the clinic [phone] and follow the prompts.  For any non-urgent questions, you may also contact your provider using MyChart. We now offer e-Visits for anyone 18 and older to request care online for non-urgent symptoms. For details visit [email or web address].   Also download the MyChart app! Go to the app store, search "MyChart", open the app, select [facility], and log in with your MyChart username and password.  Due to Covid, a mask is required upon entering the hospital/clinic. If you do not have a mask, one will be given to you upon arrival. For doctor visits, patients may have 1 support person aged 18 or older with them. For treatment visits, patients cannot have anyone with them due to current Covid guidelines and our immunocompromised population.  

## 2021-04-15 NOTE — Progress Notes (Signed)
Patient tolerated Xgeva injection with no complaints voiced.  Site clean and dry with no bruising or swelling noted.  No complaints of pain.  Patient states he is taking calcium and vitamin D supplements daily.  Discharged with vital signs stable and no signs or symptoms of distress noted.

## 2021-04-16 LAB — T3, FREE: T3, Free: 3 pg/mL (ref 2.0–4.4)

## 2021-04-17 ENCOUNTER — Telehealth: Payer: Self-pay | Admitting: *Deleted

## 2021-04-17 NOTE — Telephone Encounter (Signed)
-----   Message from Merlene Laughter, RN sent at 04/16/2021  1:16 PM EDT -----  ----- Message ----- From: Verta Ellen., NP Sent: 04/16/2021   7:56 AM EDT To: Laurine Blazer, LPN  T3 is normal  Verta Ellen, NP  04/16/2021 7:56 AM

## 2021-04-17 NOTE — Telephone Encounter (Signed)
-----   Message from Merlene Laughter, RN sent at 04/16/2021  1:15 PM EDT -----  ----- Message ----- From: Verta Ellen., NP Sent: 04/15/2021   9:19 PM EDT To: Laurine Blazer, LPN  His TSH is normal. His T4  is only slightly elevated. The reason he is more fatigued is because his hemoglobin has dropped likely from Cancer therapy. His Hgb apparently done today is 8.1 down from 9.8 just 9 days ago. He needs to reach out to his oncologist for treatment options. I researched Trudi Ida which is the the therapy he is receiving by oncology and 93% of patients experience significant fatigue. So his fatigue is from a combination of cancer therapy and anemia.   Verta Ellen, NP  04/15/2021 9:13 PM

## 2021-04-17 NOTE — Telephone Encounter (Signed)
Patient informed and verbalized understanding of plan. Copy sent to PCP 

## 2021-04-21 ENCOUNTER — Telehealth: Payer: Self-pay | Admitting: *Deleted

## 2021-04-21 NOTE — Telephone Encounter (Signed)
CALLED PATIENT TO INFORM OF LAB AND WEIGHT ON 05-05-21 @ [facility] AND HIS LAST INJ. ON 05-12-21- ARRIVAL TIME- 11:45 AM @ [facility], SPOKE WITH PATIENT AND HE IS AWARE OF THESE APPTS.

## 2021-04-21 NOTE — Progress Notes (Signed)
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 Radical resection of prostate on 06/27/2014. 2. Zytiga from 05/19/2018 to 11/09/2019. 3. Xtandi from 11/21/2019 to 12/05/2019, stopped due to mood changes  NGS Results: Guardant AR amplification, MSI normal, TMB 16 Muts/Mb  CURRENT THERAPY: Docetaxel every 3 weeks; Xgeva monthly; Lupron every 3 months  BRIEF ONCOLOGIC HISTORY:  Oncology History  Prostate Jones ([place])  02/06/2014 Procedure   Prostate biopsy by Dr. 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  Total Gleason score equals 7.  Proportion of prostatic tissue involved by tumor: 70%.   05/03/2014 Imaging   Bone scan- Negative     06/27/2014 Procedure   Radical resection of prostate by Dr. Raynelle Jones   07/29/2014 Pathology Results   1. Prostate, radical resection - PROSTATIC ADENOCARCINOMA, GLEASON SCORE 4 + 3 = 7. - RIGHT AND LEFT PROSTATE INVOLVED. - RIGHT AND LEFT SEMINAL VESICLES INVOLVED BY TUMOR. - EXTRACAPSULAR EXTENSION BY TUMOR. - TUMOR EXTENDS INTO BLADDER NECK TISSUE. - MARGINS NOT INVOLVED. 2. Lymph nodes, regional resection, left pelvic - THREE BENIGN LYMPH NODES (0/3). 3. Lymph nodes, regional resection, right pelvic - FOUR BENIGN LYMPH NODES (0/4).   11/16/2016 Imaging   Bone scan- New areas of increased uptake superolateral to the left orbit (faint), at S1, and in the posterolateral aspect of the left sixth rib.    11/25/2016 Imaging   CT CAP- 1. Status post radical prostatectomy. No findings to suggest local recurrence of disease or definite extraskeletal metastatic disease in the  chest, abdomen or pelvis. However, there are several osseous lesions, as above, concerning for metastatic disease to the bones. 2. Hepatic steatosis. 3. Aortic atherosclerosis, in addition to 2 vessel coronary artery disease. Please note that although the presence of coronary artery calcium documents the presence of coronary artery disease, the severity of this disease and any potential stenosis cannot be assessed on this non-gated CT examination. Assessment for potential risk factor modification, dietary therapy or pharmacologic therapy may be warranted, if clinically indicated. 4. Additional incidental findings, as above.    Genetic Testing   Guardant 360 Results:         06/23/2020 -  Chemotherapy    Patient is on Treatment Plan: PROSTATE DOCETAXEL + PREDNISONE Q21D         Jones STAGING: Jones Staging Prostate Jones [facility]) Staging form: Prostate, AJCC 7th Edition - Pathologic stage from 07/29/2014: Stage III (T3b, N0, cM0, Gleason 7) - Signed by Hunter Cancer, PA-C on 11/09/2016 - Clinical: Stage IV (T3, N0, M1, PSA: Less than 10, Gleason 7) - Signed by Hunter First, MD on 12/21/2016 - Pathologic: No stage assigned - Unsigned   INTERVAL HISTORY:  [name], a 65 y.o. 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 Hunter Jones was last seen on 01/21/2021.   Today he reports feeling fair. He reports losing 13 pounds in the past month, light-headedness, trouble swallowing due to choking, diarrhea, pelvic pain, diffuse body pains, and CP. His SOB and fatigue has worsened over the past 2 months. He reports pain in his shoulder bilaterally which is  worse at night, and he reports neck pain. He is taking 1-2 ibuprofen tablets every 6 hours. He has started taking iron tablets. He reports mild diarrhea and nausea, but he denies vomiting.   REVIEW OF SYSTEMS:  Review of Systems  Constitutional:  Positive for fatigue and unexpected weight  change. Negative for appetite change.  HENT:   Positive for trouble swallowing.   Respiratory:  Positive for cough and shortness of breath.   Cardiovascular:  Positive for chest pain.  Gastrointestinal:  Positive for diarrhea and nausea. Negative for vomiting.  Genitourinary:  Positive for frequency and pelvic pain.   Musculoskeletal:  Positive for arthralgias (5/10 generalized) and neck pain.  Neurological:  Positive for dizziness, headaches and light-headedness.  Psychiatric/Behavioral:  Positive for sleep disturbance.   All other systems reviewed and are negative.  PAST MEDICAL/SURGICAL HISTORY:  Past Medical History:  Diagnosis Date   Acute ST elevation myocardial infarction (STEMI) of inferior wall ([place]) 11/2019   Asthma    COPD (chronic obstructive pulmonary disease) (H[facility])    Coronary artery disease    DES to mid LAD August 2014 ([facility]); overlapping DESx2 prox-mid RCA 11/2019   Essential hypertension    Hyperthyroidism    NSTEMI (non-ST elevated myocardial infarction) ([place]) 2014   Port-A-Cath in place 06/17/2020   Prostate Jones ([place])    Type 2 diabetes mellitus ([place])    Past Surgical History:  Procedure Laterality Date   APPENDECTOMY     CORONARY/GRAFT ACUTE MI REVASCULARIZATION N/A 12/10/2019   Procedure: Coronary/Graft Acute MI Revascularization;  Surgeon: Hunter Bush, MD;  Location: [facility];  Service: Cardiovascular;  Laterality: N/A;   IR IMAGING GUIDED PORT INSERTION  06/19/2020   LEFT HEART CATH AND CORONARY ANGIOGRAPHY N/A 12/10/2019   Procedure: LEFT HEART CATH AND CORONARY ANGIOGRAPHY;  Surgeon: Hunter Bush, MD;  Location: [place] [facility];  Service: Cardiovascular;  Laterality: N/A;   LYMPHADENECTOMY Bilateral 06/27/2014   Procedure: LYMPHADENECTOMY;  Surgeon: Hunter Bring, MD;  Location: [facility];  Service: Urology;  Laterality: Bilateral;   ROBOT ASSISTED LAPAROSCOPIC RADICAL PROSTATECTOMY N/A 06/27/2014   Procedure: ROBOTIC ASSISTED LAPAROSCOPIC  RADICAL PROSTATECTOMY LEVEL 3;  Surgeon: Hunter Bring, MD;  Location: [facility];  Service: Urology;  Laterality: N/A;   TONSILLECTOMY      SOCIAL HISTORY:  Social History   Socioeconomic History   Marital status: Married    Spouse name: Not on file   Number of children: 0   Years of education: Not on file   Highest education level: Not on file  Occupational History   Occupation: truck driver    Comment: retired  Tobacco Use   Smoking status: Former    Packs/day: 1.00    Years: 40.00    Pack years: 40.00    Types: Cigarettes    Quit date: 06/21/2012    Years since quitting: 8.8   Smokeless tobacco: Never  Vaping Use   Vaping Use: Never used  Substance and Sexual Activity   Alcohol use: Yes    Comment: occasional   Drug use: No   Sexual activity: Not Currently  Other Topics Concern   Not on file  Social History Narrative   Not on file   Social Determinants of Health   Financial Resource Strain: Low Risk    Difficulty of Paying Living Expenses: Not hard at all  Food Insecurity: No Food Insecurity   Worried About [place] in the Last Year: Never true   Ran Out  of Food in the Last Year: Never true  Transportation Needs: No Transportation Needs   Lack of Transportation (Medical): No   Lack of Transportation (Non-Medical): No  Physical Activity: Inactive   Days of Exercise per Week: 0 days   Minutes of Exercise per Session: 0 min  Stress: No Stress Concern Present   Feeling of Stress : Not at all  Social Connections: Moderately Integrated   Frequency of Communication with Friends and Family: More than three times a week   Frequency of Social Gatherings with Friends and Family: Never   Attends Religious Services: Never   Marine scientist or Organizations: Yes   Attends Music therapist: Never   Marital Status: Married  Human resources officer Violence: Not At Risk   Fear of Current or Ex-Partner: No   Emotionally Abused: No   Physically Abused:  No   Sexually Abused: No    FAMILY HISTORY:  Family History  Problem Relation Age of Onset   Dementia Mother    Thyroid disease Mother    Clotting disorder Mother    Kidney Stones Father    Stroke Brother    Alzheimer's disease Maternal Uncle    Tuberculosis Paternal Grandmother    Heart attack Maternal Uncle        x4   Heart attack Cousin        in his 34s   Jones Neg Hx     CURRENT MEDICATIONS:  Current Outpatient Medications  Medication Sig Dispense Refill   albuterol (PROVENTIL HFA;VENTOLIN HFA) 108 (90 BASE) MCG/ACT inhaler Inhale 1-2 puffs into the lungs every 6 (six) hours as needed for wheezing or shortness of breath.     Ascorbic Acid (VITAMIN C PO) Take 1 tablet by mouth daily.     aspirin EC 81 MG tablet Take 81 mg by mouth 3 (three) times a week.     atorvastatin (LIPITOR) 40 MG tablet TAKE 1 TABLET BY MOUTH AT BEDTIME. (Patient taking differently: Take 40 mg by mouth at bedtime.) 30 tablet 6   Calcium Carb-Cholecalciferol (HM CALCIUM-VITAMIN D) 600-800 MG-UNIT TABS Take 1 tablet by mouth 2 (two) times daily. 1800 mg per day     Coenzyme Q10 (COQ10) 200 MG CAPS Take 200 mg by mouth daily.     denosumab (XGEVA) 120 MG/1.7ML SOLN injection Inject 120 mg into the skin every 30 (thirty) days. Last received 11/07/20     fluticasone-salmeterol (ADVAIR HFA) 115-21 MCG/ACT inhaler Inhale 2 puffs into the lungs 2 (two) times daily.      Ibuprofen-diphenhydrAMINE HCl (IBUPROFEN PM) 200-25 MG CAPS Take 1 tablet by mouth at bedtime as needed (sleep).     KRILL OIL PO Take 1 capsule by mouth daily.     Leuprolide Acetate, 3 Month, (LUPRON DEPOT, 66-MONTH, IM) Inject 1 Dose into the muscle every 3 (three) months.     lidocaine-prilocaine (EMLA) cream Apply a small amount to port a cath site and cover with plastic wrap 1 hour prior to chemotherapy appointments 30 g 3   Lutein 20 MG CAPS Take 20 mg by mouth daily.     methimazole (TAPAZOLE) 10 MG tablet Take 10 mg by mouth every  other day.      metoprolol succinate (TOPROL-XL) 25 MG 24 hr tablet Take 1 tablet (25 mg total) by mouth every other day. 45 tablet 3   Multiple Vitamin (MULTIVITAMIN) tablet Take 1 tablet by mouth daily.     nitroGLYCERIN (NITROSTAT) 0.4 MG SL tablet Place  1 tablet (0.4 mg total) under the tongue every 5 (five) minutes as needed. 25 tablet 12   Potassium 99 MG TABS Take 99 mg by mouth daily.     predniSONE (DELTASONE) 5 MG tablet Take 1 tablet (5 mg total) by mouth daily with breakfast. 60 tablet 11   Radium Ra 223 Dichloride (XOFIGO IV) Inject into the vein.     Trolamine Salicylate (ASPERCREME EX) Apply 1 application topically daily as needed (pain).     No current facility-administered medications for this visit.    ALLERGIES:  Allergies  Allergen Reactions   Xtandi [Enzalutamide] Other (See Comments)    "Severe personality change" and blood clots   Penicillins Hives    PHYSICAL EXAM:  Performance status (ECOG): 1 - Symptomatic but completely ambulatory  There were no vitals filed for this visit. Wt Readings from Last 3 Encounters:  04/14/21 245 lb 12.8 oz (111.5 kg)  03/16/21 255 lb 9.6 oz (115.9 kg)  02/18/21 255 lb (115.7 kg)   Physical Exam Vitals reviewed.  Constitutional:      Appearance: Normal appearance. He is obese.  Cardiovascular:     Rate and Rhythm: Normal rate and regular rhythm.     Pulses: Normal pulses.     Heart sounds: Normal heart sounds.  Pulmonary:     Effort: Pulmonary effort is normal.     Breath sounds: Normal breath sounds.  Musculoskeletal:     Right lower leg: No edema.     Left lower leg: No edema.  Neurological:     General: No focal deficit present.     Mental Status: He is alert and oriented to person, place, and time.  Psychiatric:        Mood and Affect: Mood normal.        Behavior: Behavior normal.     LABORATORY DATA:  I have reviewed the labs as listed.  CBC Latest Ref Rng & Units 04/15/2021 04/06/2021 03/18/2021  WBC 4.0  - 10.5 K/uL 4.9 7.2 5.6  Hemoglobin 13.0 - 17.0 g/dL 8.1(L) 9.8(L) 9.6(L)  Hematocrit 39.0 - 52.0 % 24.3(L) 29.7(L) 28.6(L)  Platelets 150 - 400 K/uL 126(L) 143(L) 128(L)   CMP Latest Ref Rng & Units 04/15/2021 03/18/2021 02/18/2021  Glucose 70 - 99 mg/dL 160(H) 179(H) 118(H)  BUN 8 - 23 mg/dL 33(H) 33(H) 29(H)  Creatinine 0.61 - 1.24 mg/dL 1.20 1.23 1.03  Sodium 135 - 145 mmol/L 136 137 138  Potassium 3.5 - 5.1 mmol/L 4.3 4.3 4.5  Chloride 98 - 111 mmol/L 108 108 109  CO2 22 - 32 mmol/L _0 Calcium 8.9 - 10.3 mg/dL 8.9 8.2(L) 8.8(L)  Total Protein 6.5 - 8.1 g/dL 6.1(L) 6.2(L) 6.2(L)  Total Bilirubin 0.3 - 1.2 mg/dL 0.4 0.6 0.6  Alkaline Phos 38 - 126 U/L 199(H) 166(H) 172(H)  AST 15 - 41 U/L 44(H) 25 26  ALT 0 - 44 U/L 63(H) 21 22    DIAGNOSTIC IMAGING:  I have independently reviewed the scans and discussed with the patient. NM XOFIGO INJECTION  Result Date: 04/13/2021  Trudi Ida was injected intravenously in Nuclear Medicine under the supervision of the attending radiologist     ASSESSMENT:  1.  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 -Abiraterone and prednisone from 06/16/2018 through 11/06/2019 with progression. -Xtandi started around 11/21/2019, discontinued on 12/05/2019 due to mood changes. -ST elevation MI involving the right coronary artery on 12/10/2019, status post cardiac  catheterization showing thrombotic occlusion of the proximal RCA, successful PCI with stent placement. -[employer] testing was negative for germline mutations. -Guardant 360 showed AR amplification.  No other mutations. -Bicalutamide 50 mg daily from 04/02/2020 through 05/12/2020 with progression. -Bone scan on 06/02/2020 with increased activity throughout the sacrum with no other bony abnormalities. -CT CAP from 06/02/2020 with no visceral metastatic disease. -Alford [place] is not compatible with Brilinta. -6 cycles of docetaxel from 06/23/2020  through 10/05/2020. - 5 treatments of radium-223 from 12/09/2020 through 04/13/2021.   PLAN:  1.  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 - He also reported diffuse body pains, mostly in the shoulders and pelvis region.  He is taking ibuprofen 1 tablet every 6 hours. - He has occasional nausea and occasional diarrhea. - He lost close to 30 pounds from the start of Xofigo and lost about 13 pounds in the last 1 month. - He has a history of MI.  His labs indicate that hemoglobin is 8.1.  I have recommended 1 unit of PRBC. - He also had elevated LFTs.  His PSA was 1659.  TSH was normal. - Recommend bone scan and a CT scan of the abdomen and pelvis. - RTC after the scans.  I will also reach out to Dr. Tammi Klippel to inform him of his deterioration.   2.  Bone metastasis: - Calcium level today is 8.9.  Continue monthly denosumab. - Continue calcium and vitamin D supplements.   3.  Elevated blood sugars: - Jardiance was discontinued as his most recent HbA1c was around 5.   4.  CAD and stents: - Continue Plavix.  He reports severe fatigue and dyspnea on exertion, worse in the last 2 months.   Orders placed this encounter:  No orders of the defined types were placed in this encounter.    Derek Jack, MD [place] [phone]   I, Thana Ates, am acting as a scribe for Dr. Derek Jack.  I, Derek Jack MD, have reviewed the above documentation for accuracy and completeness, and I agree with the above.

## 2021-04-22 ENCOUNTER — Other Ambulatory Visit: Payer: Self-pay

## 2021-04-22 ENCOUNTER — Inpatient Hospital Stay (HOSPITAL_COMMUNITY): Payer: Medicare Other

## 2021-04-22 ENCOUNTER — Inpatient Hospital Stay (HOSPITAL_BASED_OUTPATIENT_CLINIC_OR_DEPARTMENT_OTHER): Payer: Medicare Other | Admitting: Hematology

## 2021-04-22 ENCOUNTER — Encounter (HOSPITAL_COMMUNITY): Payer: Self-pay | Admitting: Hematology

## 2021-04-22 VITALS — BP 131/84 | HR 84 | Temp 97.6°F | Resp 20 | Wt 242.5 lb

## 2021-04-22 DIAGNOSIS — C61 Malignant neoplasm of prostate: Secondary | ICD-10-CM

## 2021-04-22 DIAGNOSIS — C7951 Secondary malignant neoplasm of bone: Secondary | ICD-10-CM | POA: Diagnosis not present

## 2021-04-22 DIAGNOSIS — D649 Anemia, unspecified: Secondary | ICD-10-CM

## 2021-04-22 LAB — CBC WITH DIFFERENTIAL/PLATELET
Abs Immature Granulocytes: 0.08 10*3/uL — ABNORMAL HIGH (ref 0.00–0.07)
Basophils Absolute: 0 10*3/uL (ref 0.0–0.1)
Basophils Relative: 0 %
Eosinophils Absolute: 0.1 10*3/uL (ref 0.0–0.5)
Eosinophils Relative: 2 %
HCT: 25.1 % — ABNORMAL LOW (ref 39.0–52.0)
Hemoglobin: 8.5 g/dL — ABNORMAL LOW (ref 13.0–17.0)
Immature Granulocytes: 2 %
Lymphocytes Relative: 14 %
Lymphs Abs: 0.7 10*3/uL (ref 0.7–4.0)
MCH: 31.6 pg (ref 26.0–34.0)
MCHC: 33.9 g/dL (ref 30.0–36.0)
MCV: 93.3 fL (ref 80.0–100.0)
Monocytes Absolute: 0.4 10*3/uL (ref 0.1–1.0)
Monocytes Relative: 9 %
Neutro Abs: 3.8 10*3/uL (ref 1.7–7.7)
Neutrophils Relative %: 73 %
Platelets: 121 10*3/uL — ABNORMAL LOW (ref 150–400)
RBC: 2.69 MIL/uL — ABNORMAL LOW (ref 4.22–5.81)
RDW: 16.2 % — ABNORMAL HIGH (ref 11.5–15.5)
WBC: 5.1 10*3/uL (ref 4.0–10.5)
nRBC: 0.8 % — ABNORMAL HIGH (ref 0.0–0.2)

## 2021-04-22 LAB — PREPARE RBC (CROSSMATCH)

## 2021-04-23 ENCOUNTER — Inpatient Hospital Stay (HOSPITAL_COMMUNITY): Payer: Medicare Other

## 2021-04-23 ENCOUNTER — Other Ambulatory Visit (HOSPITAL_COMMUNITY): Payer: Self-pay | Admitting: Physician Assistant

## 2021-04-23 DIAGNOSIS — D649 Anemia, unspecified: Secondary | ICD-10-CM

## 2021-04-23 DIAGNOSIS — C7951 Secondary malignant neoplasm of bone: Secondary | ICD-10-CM

## 2021-04-23 DIAGNOSIS — C61 Malignant neoplasm of prostate: Secondary | ICD-10-CM | POA: Diagnosis not present

## 2021-04-23 MED ORDER — ACETAMINOPHEN 325 MG PO TABS
650.0000 mg | ORAL_TABLET | Freq: Once | ORAL | Status: AC
  Administered 2021-04-23: 650 mg via ORAL
  Filled 2021-04-23: qty 2

## 2021-04-23 MED ORDER — SODIUM CHLORIDE 0.9% IV SOLUTION
250.0000 mL | Freq: Once | INTRAVENOUS | Status: AC
  Administered 2021-04-23: 250 mL via INTRAVENOUS

## 2021-04-23 MED ORDER — SODIUM CHLORIDE 0.9% FLUSH
10.0000 mL | INTRAVENOUS | Status: AC | PRN
  Administered 2021-04-23: 10 mL

## 2021-04-23 MED ORDER — DIPHENHYDRAMINE HCL 25 MG PO CAPS
25.0000 mg | ORAL_CAPSULE | Freq: Once | ORAL | Status: AC
  Administered 2021-04-23: 25 mg via ORAL
  Filled 2021-04-23: qty 1

## 2021-04-23 MED ORDER — HEPARIN SOD (PORK) LOCK FLUSH 100 UNIT/ML IV SOLN
500.0000 [IU] | Freq: Every day | INTRAVENOUS | Status: AC | PRN
  Administered 2021-04-23: 500 [IU]

## 2021-04-23 NOTE — Progress Notes (Signed)
UNMATCHED BLOOD PRODUCT NOTE  Compare the patient ID on the blood tag to the patient ID on the hospital armband and Blood Bank armband. Then confirm the unit number on the blood tag matches the unit number on the blood product.  If a discrepancy is discovered return the product to blood bank immediately.   2 nurses verified Ronne Binning RN and Forest Gleason RN  Consent and Attestation obtained.  Blood Product Type: Packed Red Blood Cells  Unit #: (Found on blood product bag, begins with W) [id number]  Product Code #: (Found on blood product bag, begins with E) [id number]   Start Time: 1135  Starting Rate: 120 ml/hr  Rate increase/decreased  (if applicable):  887    ml/hr    Rate changed time (if applicable): 5797   Stop Time: 2820   All Other Documentation should be documented within the Blood Admin Flowsheet per policy.

## 2021-04-23 NOTE — Progress Notes (Signed)
Patient presents today for 1 UPRBC per providers order.  Vital signs WNL.  Patient has no new complaints.    1 UPRBC given today per MD orders.  Stable during infusion without adverse affects.  Vital signs stable.  No complaints at this time.  Discharge from clinic ambulatory in stable condition.  Alert and oriented X 3.  Follow up with [facility] as scheduled.  Discharge from clinic ambulatory in stable condition.  Alert and oriented X 3.  Follow up with [facility] as scheduled.

## 2021-04-23 NOTE — Addendum Note (Signed)
Addended by: Derek Jack on: 04/23/2021 08:43 AM   Modules accepted: Orders

## 2021-04-23 NOTE — Patient Instructions (Signed)
[facility]  Discharge Instructions: ?Thank you for choosing [facility] to provide your oncology and hematology care.  ?If you have a lab appointment with the Cancer Center, please come in thru the Main Entrance and check in at the main information desk. ? ?Wear comfortable clothing and clothing appropriate for easy access to any Portacath or PICC line.  ? ?We strive to give you quality time with your provider. You may need to reschedule your appointment if you arrive late (15 or more minutes).  Arriving late affects you and other patients whose appointments are after yours.  Also, if you miss three or more appointments without notifying the office, you may be dismissed from the clinic at the provider?s discretion.    ?  ?For prescription refill requests, have your pharmacy contact our office and allow 72 hours for refills to be completed.   ? ?Today you received the following chemotherapy and/or immunotherapy agents 1UPRBC    ?  ?To help prevent nausea and vomiting after your treatment, we encourage you to take your nausea medication as directed. ? ?BELOW ARE SYMPTOMS THAT SHOULD BE REPORTED IMMEDIATELY: ?*FEVER GREATER THAN 100.4 F (38 ?C) OR HIGHER ?*CHILLS OR SWEATING ?*NAUSEA AND VOMITING THAT IS NOT CONTROLLED WITH YOUR NAUSEA MEDICATION ?*UNUSUAL SHORTNESS OF BREATH ?*UNUSUAL BRUISING OR BLEEDING ?*URINARY PROBLEMS (pain or burning when urinating, or frequent urination) ?*BOWEL PROBLEMS (unusual diarrhea, constipation, pain near the anus) ?TENDERNESS IN MOUTH AND THROAT WITH OR WITHOUT PRESENCE OF ULCERS (sore throat, sores in mouth, or a toothache) ?UNUSUAL RASH, SWELLING OR PAIN  ?UNUSUAL VAGINAL DISCHARGE OR ITCHING  ? ?Items with * indicate a potential emergency and should be followed up as soon as possible or go to the Emergency Department if any problems should occur. ? ?Please show the CHEMOTHERAPY ALERT CARD or IMMUNOTHERAPY ALERT CARD at check-in to the Emergency  Department and triage nurse. ? ?Should you have questions after your visit or need to cancel or reschedule your appointment, please contact [facility] [phone]  and follow the prompts.  Office hours are 8:00 a.m. to 4:30 p.m. Monday - Friday. Please note that voicemails left after 4:00 p.m. may not be returned until the following business day.  We are closed weekends and major holidays. You have access to a nurse at all times for urgent questions. Please call the main number to the clinic [phone] and follow the prompts. ? ?For any non-urgent questions, you may also contact your provider using MyChart. We now offer e-Visits for anyone 18 and older to request care online for non-urgent symptoms. For details visit [email or web address]. ?  ?Also download the MyChart app! Go to the app store, search "MyChart", open the app, select [facility], and log in with your MyChart username and password. ? ?Due to Covid, a mask is required upon entering the hospital/clinic. If you do not have a mask, one will be given to you upon arrival. For doctor visits, patients may have 1 support person aged 18 or older with them. For treatment visits, patients cannot have anyone with them due to current Covid guidelines and our immunocompromised population.  ?

## 2021-04-24 ENCOUNTER — Ambulatory Visit (HOSPITAL_BASED_OUTPATIENT_CLINIC_OR_DEPARTMENT_OTHER)
Admission: RE | Admit: 2021-04-24 | Discharge: 2021-04-24 | Disposition: A | Discharge status: Home / Self Care | Payer: Medicare Other | Source: Ambulatory Visit | Attending: Hematology | Admitting: Hematology

## 2021-04-24 ENCOUNTER — Other Ambulatory Visit: Payer: Self-pay

## 2021-04-24 DIAGNOSIS — C7951 Secondary malignant neoplasm of bone: Secondary | ICD-10-CM | POA: Diagnosis present

## 2021-04-24 DIAGNOSIS — C61 Malignant neoplasm of prostate: Secondary | ICD-10-CM | POA: Insufficient documentation

## 2021-04-24 LAB — TYPE AND SCREEN
ABO/RH(D): O POS
Antibody Screen: NEGATIVE
Unit division: 0

## 2021-04-24 MED ORDER — IOHEXOL 300 MG/ML  SOLN
100.0000 mL | Freq: Once | INTRAMUSCULAR | Status: AC | PRN
  Administered 2021-04-24: 100 mL via INTRAVENOUS

## 2021-04-26 NOTE — Progress Notes (Signed)
[facility] 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 Radical resection of prostate on 06/27/2014. 2. Zytiga from 05/19/2018 to 11/09/2019. 3. Xtandi from 11/21/2019 to 12/05/2019, stopped due to mood changes  NGS Results: Guardant AR amplification, MSI normal, TMB 16 Muts/Mb  CURRENT THERAPY: Docetaxel every 3 weeks; Xgeva monthly; Lupron every 3 months  BRIEF ONCOLOGIC HISTORY:  Oncology History  Prostate cancer (HCC)  02/06/2014 Procedure   Prostate biopsy by Dr. Wendall Jones   02/11/2014 Pathology Results   Prostatic adenocarcinoma identified in 5 of 6 prostate specimens with Gleason pattern showing primary pattern-grade 4, secondary pattern grade 3.  Total Gleason score equals 7.  Proportion of prostatic tissue involved by tumor: 70%.   05/03/2014 Imaging   Bone scan- Negative     06/27/2014 Procedure   Radical resection of prostate by Dr. Heloise Jones   07/29/2014 Pathology Results   1. Prostate, radical resection - PROSTATIC ADENOCARCINOMA, GLEASON SCORE 4 + 3 = 7. - RIGHT AND LEFT PROSTATE INVOLVED. - RIGHT AND LEFT SEMINAL VESICLES INVOLVED BY TUMOR. - EXTRACAPSULAR EXTENSION BY TUMOR. - TUMOR EXTENDS INTO BLADDER NECK TISSUE. - MARGINS NOT INVOLVED. 2. Lymph nodes, regional resection, left pelvic - THREE BENIGN LYMPH NODES (0/3). 3. Lymph nodes, regional resection, right pelvic - FOUR BENIGN LYMPH NODES (0/4).   11/16/2016 Imaging   Bone scan- New areas of increased uptake superolateral to the left orbit (faint), at S1, and in the posterolateral aspect of the left sixth rib.    11/25/2016 Imaging   CT CAP- 1. Status post radical prostatectomy. No findings to suggest local recurrence of disease or definite extraskeletal metastatic disease in the  chest, abdomen or pelvis. However, there are several osseous lesions, as above, concerning for metastatic disease to the bones. 2. Hepatic steatosis. 3. Aortic atherosclerosis, in addition to 2 vessel coronary artery disease. Please note that although the presence of coronary artery calcium documents the presence of coronary artery disease, the severity of this disease and any potential stenosis cannot be assessed on this non-gated CT examination. Assessment for potential risk factor modification, dietary therapy or pharmacologic therapy may be warranted, if clinically indicated. 4. Additional incidental findings, as above.    Genetic Testing   Guardant 360 Results:         06/23/2020 -  Chemotherapy    Patient is on Treatment Plan: PROSTATE DOCETAXEL + PREDNISONE Q21D         CANCER STAGING: Cancer Staging Prostate cancer [facility]) Staging form: Prostate, AJCC 7th Edition - Pathologic stage from 07/29/2014: Stage III (T3b, N0, cM0, Gleason 7) - Signed by Hunter Newer, PA-C on 11/09/2016 - Clinical: Stage IV (T3, N0, M1, PSA: Less than 10, Gleason 7) - Signed by Hunter Cork, MD on 12/21/2016 - Pathologic: No stage assigned - Unsigned   INTERVAL HISTORY:  [name], a 65 y.o. 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 Hunter Jones was last seen on 04/22/2021.   Today he reports feeling good. He reports watery green diarrhea for the past week for which he has been taking Imodium. He reports occasional eating salads and broccoli, but he denies consuming large amounts of green, leafy vegetables. He reports numbness in the soles of his feet bilaterally.  He reports continued aching pain in his shoulders and hips which is helped with 1 tablet of ibuprofen every 8 hours. He denies constipation.   REVIEW OF SYSTEMS:  Review of Systems  Constitutional:  Positive for fatigue (25%). Negative for appetite change (60%).  Respiratory:  Positive for  shortness of breath.   Gastrointestinal:  Positive for diarrhea (green) and nausea. Negative for constipation.  Genitourinary:  Positive for pelvic pain (4/10).   Musculoskeletal:  Positive for arthralgias.  Neurological:  Positive for dizziness and numbness.  Psychiatric/Behavioral:  Positive for sleep disturbance.   All other systems reviewed and are negative.  PAST MEDICAL/SURGICAL HISTORY:  Past Medical History:  Diagnosis Date   Acute ST elevation myocardial infarction (STEMI) of inferior wall ([facility]) 11/2019   Asthma    COPD (chronic obstructive pulmonary disease) (HCC)    Coronary artery disease    DES to mid LAD August 2014 ([facility]); overlapping DESx2 prox-mid RCA 11/2019   Essential hypertension    Hyperthyroidism    NSTEMI (non-ST elevated myocardial infarction) ([facility]) 2014   Port-A-Cath in place 06/17/2020   Prostate cancer ([facility])    Type 2 diabetes mellitus ([facility])    Past Surgical History:  Procedure Laterality Date   APPENDECTOMY     CORONARY/GRAFT ACUTE MI REVASCULARIZATION N/A 12/10/2019   Procedure: Coronary/Graft Acute MI Revascularization;  Surgeon: Hunter Bush, MD;  Location: [facility];  Service: Cardiovascular;  Laterality: N/A;   IR IMAGING GUIDED PORT INSERTION  06/19/2020   LEFT HEART CATH AND CORONARY ANGIOGRAPHY N/A 12/10/2019   Procedure: LEFT HEART CATH AND CORONARY ANGIOGRAPHY;  Surgeon: Hunter Bush, MD;  Location: [facility];  Service: Cardiovascular;  Laterality: N/A;   LYMPHADENECTOMY Bilateral 06/27/2014   Procedure: LYMPHADENECTOMY;  Surgeon: Hunter Bring, MD;  Location: [facility];  Service: Urology;  Laterality: Bilateral;   ROBOT ASSISTED LAPAROSCOPIC RADICAL PROSTATECTOMY N/A 06/27/2014   Procedure: ROBOTIC ASSISTED LAPAROSCOPIC RADICAL PROSTATECTOMY LEVEL 3;  Surgeon: Hunter Bring, MD;  Location: [facility];  Service: Urology;  Laterality: N/A;   TONSILLECTOMY      SOCIAL HISTORY:  Social History   Socioeconomic History   Marital  status: Married    Spouse name: Not on file   Number of children: 0   Years of education: Not on file   Highest education level: Not on file  Occupational History   Occupation: truck driver    Comment: retired  Tobacco Use   Smoking status: Former    Packs/day: 1.00    Years: 40.00    Pack years: 40.00    Types: Cigarettes    Quit date: 06/21/2012    Years since quitting: 8.8   Smokeless tobacco: Never  Vaping Use   Vaping Use: Never used  Substance and Sexual Activity   Alcohol use: Yes    Comment: occasional   Drug use: No   Sexual activity: Not Currently  Other Topics Concern   Not on file  Social History Narrative   Not on file   Social Determinants of Health   Financial Resource Strain: Low Risk    Difficulty of Paying Living Expenses: Not hard at all  Food Insecurity: No Food Insecurity   Worried About Charity fundraiser in the Last Year: Never true   [place] in the Last Year: Never true  Transportation Needs: No Transportation Needs   Lack of Transportation (Medical): No   Lack of Transportation (Non-Medical): No  Physical Activity: Inactive   Days  of Exercise per Week: 0 days   Minutes of Exercise per Session: 0 min  Stress: No Stress Concern Present   Feeling of Stress : Not at all  Social Connections: Moderately Integrated   Frequency of Communication with Friends and Family: More than three times a week   Frequency of Social Gatherings with Friends and Family: Never   Attends Religious Services: Never   Marine scientist or Organizations: Yes   Attends Music therapist: Never   Marital Status: Married  Human resources officer Violence: Not At Risk   Fear of Current or Ex-Partner: No   Emotionally Abused: No   Physically Abused: No   Sexually Abused: No    FAMILY HISTORY:  Family History  Problem Relation Age of Onset   Dementia Mother    Thyroid disease Mother    Clotting disorder Mother    Kidney Stones Father    Stroke  Brother    Alzheimer's disease Maternal Uncle    Tuberculosis Paternal Grandmother    Heart attack Maternal Uncle        x4   Heart attack Cousin        in his 82s   Cancer Neg Hx     CURRENT MEDICATIONS:  Current Outpatient Medications  Medication Sig Dispense Refill   Ascorbic Acid (VITAMIN C PO) Take 1 tablet by mouth daily.     aspirin EC 81 MG tablet Take 81 mg by mouth 3 (three) times a week.     atorvastatin (LIPITOR) 40 MG tablet TAKE 1 TABLET BY MOUTH AT BEDTIME. (Patient taking differently: Take 40 mg by mouth at bedtime.) 30 tablet 6   Calcium Carb-Cholecalciferol (HM CALCIUM-VITAMIN D) 600-800 MG-UNIT TABS Take 1 tablet by mouth 2 (two) times daily. 1800 mg per day     Coenzyme Q10 (COQ10) 200 MG CAPS Take 200 mg by mouth daily.     denosumab (XGEVA) 120 MG/1.7ML SOLN injection Inject 120 mg into the skin every 30 (thirty) days. Last received 11/07/20     fluticasone-salmeterol (ADVAIR HFA) 115-21 MCG/ACT inhaler Inhale 2 puffs into the lungs 2 (two) times daily.      Ibuprofen-diphenhydrAMINE HCl (IBUPROFEN PM) 200-25 MG CAPS Take 1 tablet by mouth at bedtime as needed (sleep).     KRILL OIL PO Take 1 capsule by mouth daily.     Leuprolide Acetate, 3 Month, (LUPRON DEPOT, 6-MONTH, IM) Inject 1 Dose into the muscle every 3 (three) months.     lidocaine-prilocaine (EMLA) cream Apply a small amount to port a cath site and cover with plastic wrap 1 hour prior to chemotherapy appointments 30 g 3   Lutein 20 MG CAPS Take 20 mg by mouth daily.     methimazole (TAPAZOLE) 10 MG tablet Take 10 mg by mouth every other day.      metoprolol succinate (TOPROL-XL) 25 MG 24 hr tablet Take 1 tablet (25 mg total) by mouth every other day. 45 tablet 3   Multiple Vitamin (MULTIVITAMIN) tablet Take 1 tablet by mouth daily.     Potassium 99 MG TABS Take 99 mg by mouth daily.     predniSONE (DELTASONE) 5 MG tablet Take 1 tablet (5 mg total) by mouth daily with breakfast. 60 tablet 11   Radium Ra  223 Dichloride (XOFIGO IV) Inject into the vein.     albuterol (PROVENTIL HFA;VENTOLIN HFA) 108 (90 BASE) MCG/ACT inhaler Inhale 1-2 puffs into the lungs every 6 (six) hours as needed for wheezing or  shortness of breath. (Patient not taking: Reported on 04/27/2021)     nitroGLYCERIN (NITROSTAT) 0.4 MG SL tablet Place 1 tablet (0.4 mg total) under the tongue every 5 (five) minutes as needed. (Patient not taking: Reported on 04/27/2021) 25 tablet 12   Trolamine Salicylate (ASPERCREME EX) Apply 1 application topically daily as needed (pain). (Patient not taking: Reported on 04/27/2021)     No current facility-administered medications for this visit.    ALLERGIES:  Allergies  Allergen Reactions   Xtandi [Enzalutamide] Other (See Comments)    "Severe personality change" and blood clots   Penicillins Hives    PHYSICAL EXAM:  Performance status (ECOG): 1 - Symptomatic but completely ambulatory  Vitals:   04/27/21 1426  BP: (!) 103/56  Pulse: (!) 102  Resp: 20  Temp: 98 F (36.7 C)  SpO2: 97%   Wt Readings from Last 3 Encounters:  04/27/21 242 lb 3.2 oz (109.9 kg)  04/22/21 242 lb 8.1 oz (110 kg)  04/14/21 245 lb 12.8 oz (111.5 kg)   Physical Exam Vitals reviewed.  Constitutional:      Appearance: Normal appearance. He is obese.  Cardiovascular:     Rate and Rhythm: Normal rate and regular rhythm.     Pulses: Normal pulses.     Heart sounds: Normal heart sounds.  Pulmonary:     Effort: Pulmonary effort is normal.     Breath sounds: Normal breath sounds.  Neurological:     General: No focal deficit present.     Mental Status: He is alert and oriented to person, place, and time.  Psychiatric:        Mood and Affect: Mood normal.        Behavior: Behavior normal.     LABORATORY DATA:  I have reviewed the labs as listed.  CBC Latest Ref Rng & Units 04/22/2021 04/15/2021 04/06/2021  WBC 4.0 - 10.5 K/uL 5.1 4.9 7.2  Hemoglobin 13.0 - 17.0 g/dL 8.5(L) 8.1(L) 9.8(L)  Hematocrit  39.0 - 52.0 % 25.1(L) 24.3(L) 29.7(L)  Platelets 150 - 400 K/uL 121(L) 126(L) 143(L)   CMP Latest Ref Rng & Units 04/15/2021 03/18/2021 02/18/2021  Glucose 70 - 99 mg/dL 160(H) 179(H) 118(H)  BUN 8 - 23 mg/dL 33(H) 33(H) 29(H)  Creatinine 0.61 - 1.24 mg/dL 1.20 1.23 1.03  Sodium 135 - 145 mmol/L 136 137 138  Potassium 3.5 - 5.1 mmol/L 4.3 4.3 4.5  Chloride 98 - 111 mmol/L 108 108 109  CO2 22 - 32 mmol/L $RemoveB'22 22 23  'OUPLQHiu$ Calcium 8.9 - 10.3 mg/dL 8.9 8.2(L) 8.8(L)  Total Protein 6.5 - 8.1 g/dL 6.1(L) 6.2(L) 6.2(L)  Total Bilirubin 0.3 - 1.2 mg/dL 0.4 0.6 0.6  Alkaline Phos 38 - 126 U/L 199(H) 166(H) 172(H)  AST 15 - 41 U/L 44(H) 25 26  ALT 0 - 44 U/L 63(H) 21 22    DIAGNOSTIC IMAGING:  I have independently reviewed the scans and discussed with the patient. NM Bone Scan Whole Body  Result Date: 04/27/2021 CLINICAL DATA:  Metastatic prostate cancer EXAM: NUCLEAR MEDICINE WHOLE BODY BONE SCAN TECHNIQUE: Whole body anterior and posterior images were obtained approximately 3 hours after intravenous injection of radiopharmaceutical. RADIOPHARMACEUTICALS:  20 mCi Technetium-26m MDP IV COMPARISON:  Bone scan 10/23/2020.  CT 04/24/2021 FINDINGS: Physiologic distribution of radiotracer with bilateral renal uptake and excretion. Significant interval progression of extensive radiotracer accumulation throughout the axial and appendicular skeleton compared to the previous bone scan. Progressive multifocal lesions include involvement throughout the thoracolumbar spine, bilateral ribs,  pelvis, bilateral femurs and proximal tibia, and bilateral proximal humeri, left greater than right. IMPRESSION: Worsening diffuse osseous metastatic disease compared to 10/23/2020. Electronically Signed   By: Davina Poke D.O.   On: 04/27/2021 11:44   CT Abdomen Pelvis W Contrast  Result Date: 04/26/2021 CLINICAL DATA:  Prostate cancer.  Assess treatment response. EXAM: CT ABDOMEN AND PELVIS WITH CONTRAST TECHNIQUE: Multidetector  CT imaging of the abdomen and pelvis was performed using the standard protocol following bolus administration of intravenous contrast. CONTRAST:  119mL OMNIPAQUE IOHEXOL 300 MG/ML  SOLN COMPARISON:  10/31/2020 FINDINGS: Lower chest: No acute abnormality. Hepatobiliary: Within the inferior right lobe of liver there is a hypodense mass measuring 4.2 x 3.1 cm, image 30/2. Adjacent lesion measures 2.5 by 2.3 cm, image 32/2. These are both new when compared with the previous exam and concerning for liver metastases. Tiny stones noted within the gallbladder fundus. Pancreas: Unremarkable. No pancreatic ductal dilatation or surrounding inflammatory changes. Spleen: Normal in size without focal abnormality. Adrenals/Urinary Tract: Adrenal glands are unremarkable. Kidneys are normal, without renal calculi, focal lesion, or hydronephrosis. Bladder is unremarkable. Stomach/Bowel: Stomach is within normal limits. The appendix is not visualized. No evidence of bowel wall thickening, distention, or inflammatory changes. Sigmoid diverticulosis. Vascular/Lymphatic: Aortic atherosclerosis. No enlarged abdominal or pelvic lymph nodes. Reproductive: Status post prostatectomy. Other: No abdominal wall hernia or abnormality. No abdominopelvic ascites. Musculoskeletal: Diffuse sclerotic bone metastases are again noted. Areas of sclerosis have progressed in the interval. IMPRESSION: 1. There are 2 new hypodense lesions within the inferior right lobe of liver concerning for liver metastases. 2. Diffuse sclerotic bone metastases. Areas of sclerosis have progressed in the interval. 3. Gallstones. 4. Aortic Atherosclerosis (ICD10-I70.0). Electronically Signed   By: Kerby Moors M.D.   On: 04/26/2021 10:34   NM XOFIGO INJECTION  Result Date: 04/13/2021  Trudi Ida was injected intravenously in Nuclear Medicine under the supervision of the attending radiologist     ASSESSMENT:  1.  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 -Abiraterone and prednisone from 06/16/2018 through 11/06/2019 with progression. -Xtandi started around 11/21/2019, discontinued on 12/05/2019 due to mood changes. -ST elevation MI involving the right coronary artery on 12/10/2019, status post cardiac catheterization showing thrombotic occlusion of the proximal RCA, successful PCI with stent placement. -Inv[employer]ae testing was negative for germline mutations. -Guardant 360 showed AR amplification.  No other mutations. -Bicalutamide 50 mg daily from 04/02/2020 through 05/12/2020 with progression. -Bone scan on 06/02/2020 with increased activity throughout the sacrum with no other bony abnormalities. -CT CAP from 06/02/2020 with no visceral metastatic disease. -Alford [place] is not compatible with Brilinta. -6 cycles of docetaxel from 06/23/2020 through 10/05/2020. - 5 treatments of radium-223 from 12/09/2020 through 04/13/2021.   PLAN:  1.  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 - We discussed bone scan from 04/27/2021 which showed worsening metastatic disease. - CTAP with contrast on 04/24/2021 shows new 2 hypodense lesions in the inferior right lobe of liver concerning for liver metastatic disease. - Last PSA was 1659 on 04/15/2021. - I do not believe a repeat biopsy of the liver lesion is needed at this time. - I have recommended change in systemic therapy.  I think he will need chemotherapy due to visceral metastatic disease.  We talked about cabazitaxel every 3 weeks with growth factor support. - We talked about side effects in detail. - We will start him on cabazitaxel  at 15 mg/m2 during cycle 1 as he had myelosuppression with radium-223. - He received 1 unit of PRBC on 04/22/2021 and felt better for couple of days. - We will closely monitor his CBC weekly to see if he needs any transfusion. - He reported green watery stools which were more profuse since he had CT  scan of the abdomen done.  He is having up to 1 watery bowel movement per day.  We will check stool GI panel and C. difficile. - Tentatively will plan to start chemotherapy later this week.  He wanted to be able to take his wife to surgery around 05/29/2021.  We will likely avoid treatment during that time.   2.  Bone metastasis: - We will continue monthly denosumab. - Continue calcium supplements.   3.  Peripheral neuropathy: - He does not have any tingling or numbness in extremities.  He is feet feel puffy as if he is walking on bubble wrap.  We will closely monitor while receiving cabazitaxel.   4.  CAD and stents: - Continue Plavix.  He put the rehab on hold at this time due to severe fatigue and dyspnea on exertion.  5.  Body pains: - He has pains in both shoulders and hips.  Currently taking ibuprofen 1 tablet every 8 hours which is helping.    Orders placed this encounter:  No orders of the defined types were placed in this encounter. Total time spent is 40 minutes with more than 50% of the time spent face-to-face discussing scan results, treatment plan, counseling and coordination of care.   Derek Jack, MD [place] [phone]   I, Thana Ates, am acting as a scribe for Dr. Derek Jack.  I, Derek Jack MD, have reviewed the above documentation for accuracy and completeness, and I agree with the above.

## 2021-04-27 ENCOUNTER — Encounter (HOSPITAL_COMMUNITY): Payer: Self-pay

## 2021-04-27 ENCOUNTER — Encounter (HOSPITAL_COMMUNITY)
Admission: RE | Admit: 2021-04-27 | Discharge: 2021-04-27 | Disposition: A | Discharge status: Home / Self Care | Payer: Medicare Other | Source: Ambulatory Visit | Attending: Hematology | Admitting: Hematology

## 2021-04-27 ENCOUNTER — Inpatient Hospital Stay (HOSPITAL_COMMUNITY): Payer: Medicare Other | Admitting: Dietician

## 2021-04-27 ENCOUNTER — Other Ambulatory Visit: Payer: Self-pay

## 2021-04-27 ENCOUNTER — Inpatient Hospital Stay (HOSPITAL_BASED_OUTPATIENT_CLINIC_OR_DEPARTMENT_OTHER): Payer: Medicare Other | Admitting: Hematology

## 2021-04-27 VITALS — BP 103/56 | HR 102 | Temp 98.0°F | Resp 20 | Wt 242.2 lb

## 2021-04-27 DIAGNOSIS — R197 Diarrhea, unspecified: Secondary | ICD-10-CM | POA: Diagnosis not present

## 2021-04-27 DIAGNOSIS — C7951 Secondary malignant neoplasm of bone: Secondary | ICD-10-CM | POA: Diagnosis present

## 2021-04-27 DIAGNOSIS — C61 Malignant neoplasm of prostate: Secondary | ICD-10-CM

## 2021-04-27 DIAGNOSIS — D649 Anemia, unspecified: Secondary | ICD-10-CM

## 2021-04-27 MED ORDER — TECHNETIUM TC 99M MEDRONATE IV KIT
20.0000 | PACK | Freq: Once | INTRAVENOUS | Status: AC | PRN
  Administered 2021-04-27: 20 via INTRAVENOUS

## 2021-04-27 NOTE — Progress Notes (Signed)
DISCONTINUE ON PATHWAY REGIMEN - Prostate     A cycle is every 21 days:     Docetaxel      Prednisone   **Always confirm dose/schedule in your pharmacy ordering system**  REASON: Disease Progression PRIOR TREATMENT: POS37: Docetaxel 75 mg/m2 q21 Days + Prednisone 5 mg BID Until Progression or Toxicity TREATMENT RESPONSE: Progressive Disease (PD)  START ON PATHWAY REGIMEN - Prostate     A cycle is every 21 days.:     Cabazitaxel      Prednisone   **Always confirm dose/schedule in your pharmacy ordering system**  Patient Characteristics: Adenocarcinoma, Recurrent/New Systemic Disease, Castration Resistant, M1, Prior Novel Hormonal Agent, No Molecular Alteration or Targeted Therapy Exhausted, Prior Docetaxel/Docetaxel Not Indicated Histology: Adenocarcinoma Therapeutic Status: Recurrent/New Systemic Disease  Intent of Therapy: Non-Curative / Palliative Intent, Discussed with Patient

## 2021-04-27 NOTE — Patient Instructions (Addendum)
[place] at [facility] Discharge Instructions  You were seen and examined today by Dr. Delton Coombes. He reviewed your most recent labs and scans. Your labs are looking stable. The bone scan is still showing multiple worsening bone lesions since scan in 10/2020. CT scan is showing that you have two new lesions on your liver. Dr. Delton Coombes recommends starting a stronger chemotherapy sometime this week. Please keep follow up as scheduled.   Thank you for choosing [place] at [facility] to provide your oncology and hematology care.  To afford each patient quality time with our provider, please arrive at least 15 minutes before your scheduled appointment time.   If you have a lab appointment with the [facility] please come in thru the Main Entrance and check in at the main information desk.  You need to re-schedule your appointment should you arrive 10 or more minutes late.  We strive to give you quality time with our providers, and arriving late affects you and other patients whose appointments are after yours.  Also, if you no show three or more times for appointments you may be dismissed from the clinic at the providers discretion.     Again, thank you for choosing [facility].  Our hope is that these requests will decrease the amount of time that you wait before being seen by our physicians.       _____________________________________________________________  Should you have questions after your visit to [facility], please contact our office at [phone] and follow the prompts.  Our office hours are 8:00 a.m. and 4:30 p.m. Monday - Friday.  Please note that voicemails left after 4:00 p.m. may not be returned until the following business day.  We are closed weekends and major holidays.  You do have access to a nurse 24-7, just call the main number to the clinic [phone] and do not press any options, hold on the line  and a nurse will answer the phone.    For prescription refill requests, have your pharmacy contact our office and allow 72 hours.    Due to Covid, you will need to wear a mask upon entering the hospital. If you do not have a mask, a mask will be given to you at the Main Entrance upon arrival. For doctor visits, patients may have 1 support person age 39 or older with them. For treatment visits, patients can not have anyone with them due to social distancing guidelines and our immunocompromised population.

## 2021-04-27 NOTE — Progress Notes (Signed)
Nutrition Assessment   Reason for Assessment: MST (+wt loss, poor po)   ASSESSMENT: 65 year old male with metastatic prostate cancer to bone. He is receiving doxetaxel every 3 weeks.   Past medical history includes COPD, CAD, HTN, DM2, NSTEMI, hyperthyroidism.   Met with patient in clinic this afternoon. Patient reports not receiving the news he was hoping for today from MD. Patient reports his appetite has been about the same, eating 3 meals daily and occasional snacks. Patient reports he has days where he does not eat as much. Patient recalls bowl of cereal for breakfast with 1% milk, snacks during the day (muffin, sausage, peanuts, oatmeal cookie) which holds him until dinner which more recently has included more heat/serve meals (frozen pizza, [employer], Violia). Patient is not currently drinking oral nutrition supplement.    Nutrition Focused Physical Exam: deferred   Medications: Ca-VitD, Vit C, MVI, lipitor, potassium, deltasone   Labs: 11/9 labs reviewed   Anthropometrics: Weights have decreased ~13 lbs (5.1%) from 255 lb 9.6 oz on 10/3.   Height: 5' 10.5" Weight: 242 lb 3.2 oz  UBW: 255 lb (9/7) BMI: 34.26   NUTRITION DIAGNOSIS: Unintentional weight loss related to cancer and associated treatments as evidenced by    INTERVENTION:  Educated on the importance of adequate calorie and protein energy intake to maintain weights, strength, nutrition - handout provided  Encouraged small frequent meals and snacks with focus on protein - recipes with snack ideas provided Suggested pt resume drinking ONS, recommend 2 Ensure Enlive/equivalent - coupons provided Samples of CIB and Ensure Max provided for pt to try Contact information provided    MONITORING, EVALUATION, GOAL: Patient will tolerate increased calories and protein to minimize further weight loss 13 lb (5%) weight loss in 5 weeks. This is insignificant for time frame.    Next Visit: via telephone ~6  weeks

## 2021-04-27 NOTE — Progress Notes (Signed)
ON PATHWAY REGIMEN - Prostate  No Change  Continue With Treatment as Ordered.  Original Decision Date/Time: 06/11/2020 17:52     A cycle is every 21 days:     Docetaxel      Prednisone   **Always confirm dose/schedule in your pharmacy ordering system**  Patient Characteristics: Adenocarcinoma, Recurrent/New Systemic Disease, Castration Resistant, M1, Prior Novel Hormonal Agent, No Molecular Alteration or Targeted Therapy Exhausted, No Prior Docetaxel Histology: Adenocarcinoma Therapeutic Status: Recurrent/New Systemic Disease  Intent of Therapy: Non-Curative / Palliative Intent, Discussed with Patient

## 2021-04-28 ENCOUNTER — Other Ambulatory Visit (HOSPITAL_COMMUNITY): Payer: Self-pay

## 2021-04-28 DIAGNOSIS — C61 Malignant neoplasm of prostate: Secondary | ICD-10-CM | POA: Diagnosis not present

## 2021-04-28 DIAGNOSIS — R197 Diarrhea, unspecified: Secondary | ICD-10-CM

## 2021-04-28 LAB — C DIFFICILE QUICK SCREEN W PCR REFLEX
C Diff antigen: NEGATIVE
C Diff interpretation: NOT DETECTED
C Diff toxin: NEGATIVE

## 2021-04-29 LAB — GASTROINTESTINAL PANEL BY PCR, STOOL (REPLACES STOOL CULTURE)

## 2021-04-29 MED ORDER — LIDOCAINE-PRILOCAINE 2.5-2.5 % EX CREA: TOPICAL_CREAM | CUTANEOUS | 3 refills | Status: DC

## 2021-04-29 MED ORDER — PREDNISONE 10 MG PO TABS: 10.0000 mg | ORAL_TABLET | Freq: Every day | ORAL | 3 refills | Status: DC

## 2021-04-29 MED ORDER — PROCHLORPERAZINE MALEATE 10 MG PO TABS: 10.0000 mg | ORAL_TABLET | Freq: Four times a day (QID) | ORAL | 1 refills | Status: DC | PRN

## 2021-04-30 ENCOUNTER — Inpatient Hospital Stay (HOSPITAL_COMMUNITY): Payer: Medicare Other

## 2021-04-30 ENCOUNTER — Other Ambulatory Visit: Payer: Self-pay

## 2021-04-30 VITALS — BP 115/57 | HR 78 | Temp 98.7°F | Resp 18 | Wt 243.4 lb

## 2021-04-30 DIAGNOSIS — C7951 Secondary malignant neoplasm of bone: Secondary | ICD-10-CM

## 2021-04-30 DIAGNOSIS — C61 Malignant neoplasm of prostate: Secondary | ICD-10-CM

## 2021-04-30 DIAGNOSIS — Z95828 Presence of other vascular implants and grafts: Secondary | ICD-10-CM

## 2021-04-30 LAB — COMPREHENSIVE METABOLIC PANEL
ALT: 49 U/L — ABNORMAL HIGH (ref 0–44)
AST: 46 U/L — ABNORMAL HIGH (ref 15–41)
Albumin: 3 g/dL — ABNORMAL LOW (ref 3.5–5.0)
Alkaline Phosphatase: 228 U/L — ABNORMAL HIGH (ref 38–126)
Anion gap: 9 (ref 5–15)
BUN: 38 mg/dL — ABNORMAL HIGH (ref 8–23)
CO2: 24 mmol/L (ref 22–32)
Calcium: 9.1 mg/dL (ref 8.9–10.3)
Chloride: 104 mmol/L (ref 98–111)
Creatinine, Ser: 1.27 mg/dL — ABNORMAL HIGH (ref 0.61–1.24)
GFR, Estimated: >60 mL/min (ref >60)
Glucose, Bld: 174 mg/dL — ABNORMAL HIGH (ref 70–99)
Potassium: 4.9 mmol/L (ref 3.5–5.1)
Sodium: 137 mmol/L (ref 135–145)
Total Bilirubin: 0.7 mg/dL (ref 0.3–1.2)
Total Protein: 6.7 g/dL (ref 6.5–8.1)

## 2021-04-30 LAB — CBC WITH DIFFERENTIAL/PLATELET
Abs Immature Granulocytes: 0.14 10*3/uL — ABNORMAL HIGH (ref 0.00–0.07)
Basophils Absolute: 0 10*3/uL (ref 0.0–0.1)
Basophils Relative: 1 %
Eosinophils Absolute: 0.1 10*3/uL (ref 0.0–0.5)
Eosinophils Relative: 2 %
HCT: 28.8 % — ABNORMAL LOW (ref 39.0–52.0)
Hemoglobin: 9.6 g/dL — ABNORMAL LOW (ref 13.0–17.0)
Immature Granulocytes: 2 %
Lymphocytes Relative: 12 %
Lymphs Abs: 0.7 10*3/uL (ref 0.7–4.0)
MCH: 31.2 pg (ref 26.0–34.0)
MCHC: 33.3 g/dL (ref 30.0–36.0)
MCV: 93.5 fL (ref 80.0–100.0)
Monocytes Absolute: 0.6 10*3/uL (ref 0.1–1.0)
Monocytes Relative: 10 %
Neutro Abs: 4.3 10*3/uL (ref 1.7–7.7)
Neutrophils Relative %: 73 %
Platelets: 117 10*3/uL — ABNORMAL LOW (ref 150–400)
RBC: 3.08 MIL/uL — ABNORMAL LOW (ref 4.22–5.81)
RDW: 16.5 % — ABNORMAL HIGH (ref 11.5–15.5)
WBC: 5.8 10*3/uL (ref 4.0–10.5)
nRBC: 0.5 % — ABNORMAL HIGH (ref 0.0–0.2)

## 2021-04-30 MED ORDER — SODIUM CHLORIDE 0.9 % IV SOLN: Freq: Once | INTRAVENOUS | Status: AC

## 2021-04-30 MED ORDER — FAMOTIDINE 20 MG IN NS 100 ML IVPB: 20.0000 mg | Freq: Once | INTRAVENOUS | Status: DC

## 2021-04-30 MED ORDER — SODIUM CHLORIDE 0.9 % IV SOLN
10.0000 mg | Freq: Once | INTRAVENOUS | Status: AC
  Administered 2021-04-30: 10:00:00 10 mg via INTRAVENOUS
  Filled 2021-04-30: qty 10

## 2021-04-30 MED ORDER — SODIUM CHLORIDE 0.9% FLUSH
10.0000 mL | INTRAVENOUS | Status: DC | PRN
  Administered 2021-04-30: 13:00:00 10 mL

## 2021-04-30 MED ORDER — PALONOSETRON HCL INJECTION 0.25 MG/5ML
0.2500 mg | Freq: Once | INTRAVENOUS | Status: AC
  Administered 2021-04-30: 10:00:00 0.25 mg via INTRAVENOUS
  Filled 2021-04-30: qty 5

## 2021-04-30 MED ORDER — SODIUM CHLORIDE 0.9 % IV SOLN
15.0000 mg/m2 | Freq: Once | INTRAVENOUS | Status: AC
  Administered 2021-04-30: 11:00:00 35 mg via INTRAVENOUS
  Filled 2021-04-30: qty 3.5

## 2021-04-30 MED ORDER — HEPARIN SOD (PORK) LOCK FLUSH 100 UNIT/ML IV SOLN
500.0000 [IU] | Freq: Once | INTRAVENOUS | Status: AC | PRN
  Administered 2021-04-30: 13:00:00 500 [IU]

## 2021-04-30 MED ORDER — FAMOTIDINE IN NACL 20-0.9 MG/50ML-% IV SOLN
20.0000 mg | Freq: Once | INTRAVENOUS | Status: AC
  Administered 2021-04-30: 10:00:00 20 mg via INTRAVENOUS
  Filled 2021-04-30: qty 50

## 2021-04-30 MED ORDER — DIPHENHYDRAMINE HCL 50 MG/ML IJ SOLN
25.0000 mg | Freq: Once | INTRAMUSCULAR | Status: AC
  Administered 2021-04-30: 10:00:00 25 mg via INTRAVENOUS
  Filled 2021-04-30: qty 1

## 2021-04-30 NOTE — Patient Instructions (Signed)
[facility]  Discharge Instructions: Thank you for choosing [facility] to provide your oncology and hematology care.  If you have a lab appointment with the [facility], please come in thru the Main Entrance and check in at the main information desk.  Wear comfortable clothing and clothing appropriate for easy access to any Portacath or PICC line.   We strive to give you quality time with your provider. You may need to reschedule your appointment if you arrive late (15 or more minutes).  Arriving late affects you and other patients whose appointments are after yours.  Also, if you miss three or more appointments without notifying the office, you may be dismissed from the clinic at the provider's discretion.      For prescription refill requests, have your pharmacy contact our office and allow 72 hours for refills to be completed.    Today you received the following chemotherapy and/or immunotherapy agents Jevtana      To help prevent nausea and vomiting after your treatment, we encourage you to take your nausea medication as directed.  BELOW ARE SYMPTOMS THAT SHOULD BE REPORTED IMMEDIATELY: *FEVER GREATER THAN 100.4 F (38 C) OR HIGHER *CHILLS OR SWEATING *NAUSEA AND VOMITING THAT IS NOT CONTROLLED WITH YOUR NAUSEA MEDICATION *UNUSUAL SHORTNESS OF BREATH *UNUSUAL BRUISING OR BLEEDING *URINARY PROBLEMS (pain or burning when urinating, or frequent urination) *BOWEL PROBLEMS (unusual diarrhea, constipation, pain near the anus) TENDERNESS IN MOUTH AND THROAT WITH OR WITHOUT PRESENCE OF ULCERS (sore throat, sores in mouth, or a toothache) UNUSUAL RASH, SWELLING OR PAIN  UNUSUAL VAGINAL DISCHARGE OR ITCHING   Items with * indicate a potential emergency and should be followed up as soon as possible or go to the Emergency Department if any problems should occur.  Please show the CHEMOTHERAPY ALERT CARD or IMMUNOTHERAPY ALERT CARD at check-in to the Emergency  Department and triage nurse.  Should you have questions after your visit or need to cancel or reschedule your appointment, please contact [facility] [phone]  and follow the prompts.  Office hours are 8:00 a.m. to 4:30 p.m. Monday - Friday. Please note that voicemails left after 4:00 p.m. may not be returned until the following business day.  We are closed weekends and major holidays. You have access to a nurse at all times for urgent questions. Please call the main number to the clinic [phone] and follow the prompts.  For any non-urgent questions, you may also contact your provider using MyChart. We now offer e-Visits for anyone 76 and older to request care online for non-urgent symptoms. For details visit [email or web address].   Also download the MyChart app! Go to the app store, search "MyChart", open the app, select [facility], and log in with your MyChart username and password.  Due to Covid, a mask is required upon entering the hospital/clinic. If you do not have a mask, one will be given to you upon arrival. For doctor visits, patients may have 1 support person aged 29 or older with them. For treatment visits, patients cannot have anyone with them due to current Covid guidelines and our immunocompromised population.

## 2021-04-30 NOTE — Progress Notes (Signed)
Pharmacist Chemotherapy Monitoring - Initial Assessment    Anticipated start date: 04/30/21   The following has been reviewed per standard work regarding the patient's treatment regimen: The patient's diagnosis, treatment plan and drug doses, and organ/hematologic function Lab orders and baseline tests specific to treatment regimen  The treatment plan start date, drug sequencing, and pre-medications Prior authorization status  Patient's documented medication list, including drug-drug interaction screen and prescriptions for anti-emetics and supportive care specific to the treatment regimen The drug concentrations, fluid compatibility, administration routes, and timing of the medications to be used The patient's access for treatment and lifetime cumulative dose history, if applicable  The patient's medication allergies and previous infusion related reactions, if applicable   Changes made to treatment plan:  treatment plan date  Follow up needed:  Needed injection appt - sent to schedulers to set up.    Wynona Neat, RPH, 04/30/2021  9:45 AM

## 2021-04-30 NOTE — Progress Notes (Signed)
Patient presents today for D1C1 Jevtana infusion per providers order.  Vital signs and labs within parameters for treatment.  Patient ha no new complaints at this time.    Jevtana infusion given today per MD orders.  Stable during infusion without adverse affects.  Vital signs stable.  No complaints at this time.  Discharge from clinic ambulatory in stable condition.  Alert and oriented X 3.  Follow up with [facility] as scheduled.

## 2021-05-01 ENCOUNTER — Other Ambulatory Visit: Payer: Self-pay

## 2021-05-01 ENCOUNTER — Telehealth: Payer: Self-pay | Admitting: *Deleted

## 2021-05-01 ENCOUNTER — Inpatient Hospital Stay (HOSPITAL_COMMUNITY): Payer: Medicare Other

## 2021-05-01 VITALS — BP 121/67 | HR 90 | Temp 97.2°F | Resp 18

## 2021-05-01 DIAGNOSIS — C61 Malignant neoplasm of prostate: Secondary | ICD-10-CM | POA: Diagnosis not present

## 2021-05-01 DIAGNOSIS — Z95828 Presence of other vascular implants and grafts: Secondary | ICD-10-CM

## 2021-05-01 MED ORDER — PEGFILGRASTIM-CBQV 6 MG/0.6ML ~~LOC~~ SOSY
6.0000 mg | PREFILLED_SYRINGE | Freq: Once | SUBCUTANEOUS | Status: AC
  Administered 2021-05-01: 6 mg via SUBCUTANEOUS
  Filled 2021-05-01: qty 0.6

## 2021-05-01 NOTE — Progress Notes (Signed)
Hunter Jones presents today for injection per the provider's orders.  Udenyca administration without incident; injection site WNL; see MAR for injection details.  Patient tolerated procedure well and without incident.  No questions or complaints noted at this time.  Udenyca injection given today per MD orders. Tolerated infusion without adverse affects. Vital signs stable. No complaints at this time. Discharged from clinic ambulatory in stable condition. Alert and oriented x 3. F/U with [facility] as scheduled.

## 2021-05-01 NOTE — Patient Instructions (Signed)
[facility]  Discharge Instructions: Thank you for choosing [facility] to provide your oncology and hematology care.  If you have a lab appointment with the Cancer Center, please come in thru the Main Entrance and check in at the main information desk.  Wear comfortable clothing and clothing appropriate for easy access to any Portacath or PICC line.   We strive to give you quality time with your provider. You may need to reschedule your appointment if you arrive late (15 or more minutes).  Arriving late affects you and other patients whose appointments are after yours.  Also, if you miss three or more appointments without notifying the office, you may be dismissed from the clinic at the provider's discretion.      For prescription refill requests, have your pharmacy contact our office and allow 72 hours for refills to be completed.    Today you received Udenyca injection.     BELOW ARE SYMPTOMS THAT SHOULD BE REPORTED IMMEDIATELY: *FEVER GREATER THAN 100.4 F (38 C) OR HIGHER *CHILLS OR SWEATING *NAUSEA AND VOMITING THAT IS NOT CONTROLLED WITH YOUR NAUSEA MEDICATION *UNUSUAL SHORTNESS OF BREATH *UNUSUAL BRUISING OR BLEEDING *URINARY PROBLEMS (pain or burning when urinating, or frequent urination) *BOWEL PROBLEMS (unusual diarrhea, constipation, pain near the anus) TENDERNESS IN MOUTH AND THROAT WITH OR WITHOUT PRESENCE OF ULCERS (sore throat, sores in mouth, or a toothache) UNUSUAL RASH, SWELLING OR PAIN  UNUSUAL VAGINAL DISCHARGE OR ITCHING   Items with * indicate a potential emergency and should be followed up as soon as possible or go to the Emergency Department if any problems should occur.  Please show the CHEMOTHERAPY ALERT CARD or IMMUNOTHERAPY ALERT CARD at check-in to the Emergency Department and triage nurse.  Should you have questions after your visit or need to cancel or reschedule your appointment, please contact [facility]  [phone]  and follow the prompts.  Office hours are 8:00 a.m. to 4:30 p.m. Monday - Friday. Please note that voicemails left after 4:00 p.m. may not be returned until the following business day.  We are closed weekends and major holidays. You have access to a nurse at all times for urgent questions. Please call the main number to the clinic [phone] and follow the prompts.  For any non-urgent questions, you may also contact your provider using MyChart. We now offer e-Visits for anyone 18 and older to request care online for non-urgent symptoms. For details visit [email or web address].   Also download the MyChart app! Go to the app store, search "MyChart", open the app, select [facility], and log in with your MyChart username and password.  Due to Covid, a mask is required upon entering the hospital/clinic. If you do not have a mask, one will be given to you upon arrival. For doctor visits, patients may have 1 support person aged 18 or older with them. For treatment visits, patients cannot have anyone with them due to current Covid guidelines and our immunocompromised population.  

## 2021-05-01 NOTE — Telephone Encounter (Signed)
CALLED PATIENT TO INFORM THAT XOFIGO HAS BEEN CANCELLED PER DR. MANNING ON 05-12-21 DUE TO HIM BEING ANEMIC, I ALSO TOLD HIM THAT DR. MANNING WOULD LIKE TO DO RT, THE PATIENT HE IS GOING TO WAIT TO FINISH CHEMO AND THEN DECIDE ABOUT RADIATION, INFORMED DR. MANNING

## 2021-05-01 NOTE — Progress Notes (Signed)
24 hour call back.  Patient states that he feels rough and was unable to sleep last night.  Reminded patient that he had Dexamethasone with his treatment and that may have contributed to his insomnia, if he continues to have problems to call back to the clinic.  Patient agreed that he would call if symptoms persist.

## 2021-05-05 ENCOUNTER — Other Ambulatory Visit: Payer: Self-pay

## 2021-05-05 ENCOUNTER — Inpatient Hospital Stay (HOSPITAL_COMMUNITY): Payer: Medicare Other

## 2021-05-05 ENCOUNTER — Other Ambulatory Visit (HOSPITAL_COMMUNITY): Payer: Self-pay

## 2021-05-05 DIAGNOSIS — C61 Malignant neoplasm of prostate: Secondary | ICD-10-CM

## 2021-05-05 DIAGNOSIS — D649 Anemia, unspecified: Secondary | ICD-10-CM

## 2021-05-05 DIAGNOSIS — C7951 Secondary malignant neoplasm of bone: Secondary | ICD-10-CM

## 2021-05-05 LAB — CBC WITH DIFFERENTIAL/PLATELET
Abs Immature Granulocytes: 1 10*3/uL — ABNORMAL HIGH (ref 0.00–0.07)
Basophils Absolute: 0 10*3/uL (ref 0.0–0.1)
Basophils Relative: 0 %
Eosinophils Absolute: 0.1 10*3/uL (ref 0.0–0.5)
Eosinophils Relative: 1 %
HCT: 26.6 % — ABNORMAL LOW (ref 39.0–52.0)
Hemoglobin: 8.7 g/dL — ABNORMAL LOW (ref 13.0–17.0)
Immature Granulocytes: 10 %
Lymphocytes Relative: 9 %
Lymphs Abs: 0.9 10*3/uL (ref 0.7–4.0)
MCH: 30.7 pg (ref 26.0–34.0)
MCHC: 32.7 g/dL (ref 30.0–36.0)
MCV: 94 fL (ref 80.0–100.0)
Monocytes Absolute: 0.8 10*3/uL (ref 0.1–1.0)
Monocytes Relative: 8 %
Neutro Abs: 7.4 10*3/uL (ref 1.7–7.7)
Neutrophils Relative %: 72 %
Platelets: 78 10*3/uL — ABNORMAL LOW (ref 150–400)
RBC: 2.83 MIL/uL — ABNORMAL LOW (ref 4.22–5.81)
RDW: 16.3 % — ABNORMAL HIGH (ref 11.5–15.5)
WBC Morphology: INCREASED
WBC: 10.2 10*3/uL (ref 4.0–10.5)
nRBC: 1.3 % — ABNORMAL HIGH (ref 0.0–0.2)

## 2021-05-05 LAB — SAMPLE TO BLOOD BANK

## 2021-05-05 NOTE — Progress Notes (Signed)
Standing orders put in for blood bank sample.

## 2021-05-06 ENCOUNTER — Other Ambulatory Visit (HOSPITAL_COMMUNITY): Payer: Medicare Other

## 2021-05-06 ENCOUNTER — Inpatient Hospital Stay (HOSPITAL_COMMUNITY): Payer: Medicare Other

## 2021-05-12 ENCOUNTER — Encounter (HOSPITAL_COMMUNITY): Admission: RE | Admit: 2021-05-12 | Payer: Medicare Other | Source: Ambulatory Visit

## 2021-05-13 ENCOUNTER — Other Ambulatory Visit (HOSPITAL_COMMUNITY): Payer: Self-pay | Admitting: *Deleted

## 2021-05-13 ENCOUNTER — Other Ambulatory Visit: Payer: Self-pay

## 2021-05-13 ENCOUNTER — Inpatient Hospital Stay (HOSPITAL_COMMUNITY): Payer: Medicare Other

## 2021-05-13 DIAGNOSIS — D649 Anemia, unspecified: Secondary | ICD-10-CM

## 2021-05-13 DIAGNOSIS — C61 Malignant neoplasm of prostate: Secondary | ICD-10-CM

## 2021-05-13 DIAGNOSIS — C7951 Secondary malignant neoplasm of bone: Secondary | ICD-10-CM

## 2021-05-13 LAB — CBC WITH DIFFERENTIAL/PLATELET
Abs Immature Granulocytes: 0.32 10*3/uL — ABNORMAL HIGH (ref 0.00–0.07)
Basophils Absolute: 0 10*3/uL (ref 0.0–0.1)
Basophils Relative: 0 %
Eosinophils Absolute: 0 10*3/uL (ref 0.0–0.5)
Eosinophils Relative: 0 %
HCT: 22.4 % — ABNORMAL LOW (ref 39.0–52.0)
Hemoglobin: 7.2 g/dL — ABNORMAL LOW (ref 13.0–17.0)
Immature Granulocytes: 4 %
Lymphocytes Relative: 8 %
Lymphs Abs: 0.7 10*3/uL (ref 0.7–4.0)
MCH: 31.3 pg (ref 26.0–34.0)
MCHC: 32.1 g/dL (ref 30.0–36.0)
MCV: 97.4 fL (ref 80.0–100.0)
Monocytes Absolute: 0.5 10*3/uL (ref 0.1–1.0)
Monocytes Relative: 6 %
Neutro Abs: 7.6 10*3/uL (ref 1.7–7.7)
Neutrophils Relative %: 82 %
Platelets: 56 10*3/uL — ABNORMAL LOW (ref 150–400)
RBC: 2.3 MIL/uL — ABNORMAL LOW (ref 4.22–5.81)
RDW: 18.3 % — ABNORMAL HIGH (ref 11.5–15.5)
Smear Review: DECREASED
WBC: 9.2 10*3/uL (ref 4.0–10.5)
nRBC: 1.4 % — ABNORMAL HIGH (ref 0.0–0.2)

## 2021-05-13 LAB — SAMPLE TO BLOOD BANK

## 2021-05-13 LAB — PREPARE RBC (CROSSMATCH)

## 2021-05-13 MED ORDER — ACETAMINOPHEN 325 MG PO TABS: 650.0000 mg | ORAL_TABLET | Freq: Once | ORAL | Status: DC

## 2021-05-13 MED ORDER — DIPHENHYDRAMINE HCL 50 MG/ML IJ SOLN: 25.0000 mg | Freq: Once | INTRAMUSCULAR | Status: DC

## 2021-05-13 NOTE — Progress Notes (Signed)
Patient's wife called inquiring about patient's hgb from this morning.  Result is 7.2 and he is reporting symptoms of severe fatigue and SOB.  Put on schedule for 1 unit of blood on 12/2 at 10 am.  Patient aware and will seek medical attention if symptoms worsen prior to infusion.

## 2021-05-15 ENCOUNTER — Inpatient Hospital Stay (HOSPITAL_COMMUNITY): Payer: Medicare Other | Attending: Hematology

## 2021-05-15 ENCOUNTER — Other Ambulatory Visit: Payer: Self-pay

## 2021-05-15 DIAGNOSIS — C7951 Secondary malignant neoplasm of bone: Secondary | ICD-10-CM | POA: Diagnosis not present

## 2021-05-15 DIAGNOSIS — D649 Anemia, unspecified: Secondary | ICD-10-CM | POA: Insufficient documentation

## 2021-05-15 DIAGNOSIS — Z5111 Encounter for antineoplastic chemotherapy: Secondary | ICD-10-CM | POA: Insufficient documentation

## 2021-05-15 DIAGNOSIS — Z79899 Other long term (current) drug therapy: Secondary | ICD-10-CM | POA: Diagnosis not present

## 2021-05-15 DIAGNOSIS — C61 Malignant neoplasm of prostate: Secondary | ICD-10-CM | POA: Diagnosis not present

## 2021-05-15 DIAGNOSIS — Z5189 Encounter for other specified aftercare: Secondary | ICD-10-CM | POA: Diagnosis not present

## 2021-05-15 MED ORDER — SODIUM CHLORIDE 0.9% FLUSH
10.0000 mL | INTRAVENOUS | Status: AC | PRN
  Administered 2021-05-15: 10 mL

## 2021-05-15 MED ORDER — SODIUM CHLORIDE 0.9% IV SOLUTION
250.0000 mL | Freq: Once | INTRAVENOUS | Status: AC
  Administered 2021-05-15: 250 mL via INTRAVENOUS

## 2021-05-15 MED ORDER — HEPARIN SOD (PORK) LOCK FLUSH 100 UNIT/ML IV SOLN
500.0000 [IU] | Freq: Once | INTRAVENOUS | Status: AC
  Administered 2021-05-15: 500 [IU]

## 2021-05-15 MED ORDER — DIPHENHYDRAMINE HCL 25 MG PO CAPS
25.0000 mg | ORAL_CAPSULE | Freq: Once | ORAL | Status: AC
  Administered 2021-05-15: 25 mg via ORAL
  Filled 2021-05-15: qty 1

## 2021-05-15 MED ORDER — ACETAMINOPHEN 325 MG PO TABS
650.0000 mg | ORAL_TABLET | Freq: Once | ORAL | Status: AC
  Administered 2021-05-15: 650 mg via ORAL
  Filled 2021-05-15: qty 2

## 2021-05-15 NOTE — Progress Notes (Signed)
Pt presents for 1 unit of blood per provider's order. Vital signs stable. Pt c/o shortness of breath and dizziness when standing.MD made aware.   1 unit of blood given today per MD orders. Tolerated infusion without adverse affects. Vital signs stable. No complaints at this time. Discharged from clinic ambulatory in stable condition. Alert and oriented x 3. F/U with [facility] as scheduled.

## 2021-05-17 LAB — BPAM RBC
Blood Product Expiration Date: 202301022359
ISSUE DATE / TIME: 202212021108
Unit Type and Rh: 5100

## 2021-05-17 LAB — TYPE AND SCREEN
ABO/RH(D): O POS
Antibody Screen: NEGATIVE
Unit division: 0

## 2021-05-20 NOTE — Progress Notes (Signed)
[facility] [address], [place] [address]   CLINIC:  Medical Oncology/Hematology  PCP:  Kirstie Peri, MD [address] 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 Radical resection of prostate on 06/27/2014. 2. Zytiga from 05/19/2018 to 11/09/2019. 3. Xtandi from 11/21/2019 to 12/05/2019, stopped due to mood changes  NGS Results: Guardant AR amplification, MSI normal, TMB 16 Muts/Mb  CURRENT THERAPY: Docetaxel every 3 weeks; Xgeva monthly; Lupron every 3 months  BRIEF ONCOLOGIC HISTORY:  Oncology History  Prostate cancer (HCC)  02/06/2014 Procedure   Prostate biopsy by Dr. Wendall Stade   02/11/2014 Pathology Results   Prostatic adenocarcinoma identified in 5 of 6 prostate specimens with Gleason pattern showing primary pattern-grade 4, secondary pattern grade 3.  Total Gleason score equals 7.  Proportion of prostatic tissue involved by tumor: 70%.   05/03/2014 Imaging   Bone scan- Negative     06/27/2014 Procedure   Radical resection of prostate by Dr. Heloise Purpura   07/29/2014 Pathology Results   1. Prostate, radical resection - PROSTATIC ADENOCARCINOMA, GLEASON SCORE 4 + 3 = 7. - RIGHT AND LEFT PROSTATE INVOLVED. - RIGHT AND LEFT SEMINAL VESICLES INVOLVED BY TUMOR. - EXTRACAPSULAR EXTENSION BY TUMOR. - TUMOR EXTENDS INTO BLADDER NECK TISSUE. - MARGINS NOT INVOLVED. 2. Lymph nodes, regional resection, left pelvic - THREE BENIGN LYMPH NODES (0/3). 3. Lymph nodes, regional resection, right pelvic - FOUR BENIGN LYMPH NODES (0/4).   11/16/2016 Imaging   Bone scan- New areas of increased uptake superolateral to the left orbit (faint), at S1, and in the posterolateral aspect of the left sixth rib.    11/25/2016 Imaging   CT CAP- 1. Status post radical prostatectomy. No findings to suggest local recurrence of disease or definite extraskeletal metastatic disease in the  chest, abdomen or pelvis. However, there are several osseous lesions, as above, concerning for metastatic disease to the bones. 2. Hepatic steatosis. 3. Aortic atherosclerosis, in addition to 2 vessel coronary artery disease. Please note that although the presence of coronary artery calcium documents the presence of coronary artery disease, the severity of this disease and any potential stenosis cannot be assessed on this non-gated CT examination. Assessment for potential risk factor modification, dietary therapy or pharmacologic therapy may be warranted, if clinically indicated. 4. Additional incidental findings, as above.    Genetic Testing   Guardant 360 Results:         06/23/2020 - 10/08/2020 Chemotherapy   Patient is on Treatment Plan : PROSTATE Docetaxel + Prednisone q21d     04/30/2021 -  Chemotherapy   Patient is on Treatment Plan : PROSTATE Cabazitaxel + Prednisone q21d       CANCER STAGING:  Cancer Staging  Prostate cancer [facility]) Staging form: Prostate, AJCC 7th Edition - Pathologic stage from 07/29/2014: Stage III (T3b, N0, cM0, Gleason 7) - Signed by Ellouise Newer, PA-C on 11/09/2016 - Clinical: Stage IV (T3, N0, M1, PSA: Less than 10, Gleason 7) - Signed by Ralene Cork, MD on 12/21/2016 - Pathologic: No stage assigned - Unsigned   INTERVAL HISTORY:  [name], a 65 y.o. male, returns for routine follow-up and consideration for next cycle of chemotherapy. Hunter Jones was last seen on 04/27/2021.  Due for cycle #2 of Cabazitaxel today.   Overall, he tells me he has been feeling pretty well. His appetite is good and about 80% from his baseline.  He take 200 mg ibuprofen every 5-8 hours as needed. He reports dark green stools. He reports SOB.   Overall, he does not feel ready for next cycle of chemo today.   REVIEW OF SYSTEMS:  Review of Systems  Constitutional:  Positive for fatigue (25%). Negative for appetite change (80%).  Respiratory:  Positive for  shortness of breath.   Gastrointestinal:  Positive for diarrhea.  Genitourinary:  Positive for frequency.   Neurological:  Positive for dizziness.  All other systems reviewed and are negative.  PAST MEDICAL/SURGICAL HISTORY:  Past Medical History:  Diagnosis Date   Acute ST elevation myocardial infarction (STEMI) of inferior wall (HCC) 11/2019   Asthma    COPD (chronic obstructive pulmonary disease) (HCC)    Coronary artery disease    DES to mid LAD August 2014 ([facility]); overlapping DESx2 prox-mid RCA 11/2019   Essential hypertension    Hyperthyroidism    NSTEMI (non-ST elevated myocardial infarction) (HCC) 2014   Port-A-Cath in place 06/17/2020   Prostate cancer (HCC)    Type 2 diabetes mellitus (HCC)    Past Surgical History:  Procedure Laterality Date   APPENDECTOMY     CORONARY/GRAFT ACUTE MI REVASCULARIZATION N/A 12/10/2019   Procedure: Coronary/Graft Acute MI Revascularization;  Surgeon: Yvonne Kendall, MD;  Location: [facility] INVASIVE CV LAB;  Service: Cardiovascular;  Laterality: N/A;   IR IMAGING GUIDED PORT INSERTION  06/19/2020   LEFT HEART CATH AND CORONARY ANGIOGRAPHY N/A 12/10/2019   Procedure: LEFT HEART CATH AND CORONARY ANGIOGRAPHY;  Surgeon: Yvonne Kendall, MD;  Location: [facility] INVASIVE CV LAB;  Service: Cardiovascular;  Laterality: N/A;   LYMPHADENECTOMY Bilateral 06/27/2014   Procedure: LYMPHADENECTOMY;  Surgeon: Heloise Purpura, MD;  Location: [facility] OR[facility];  Service: Urology;  Laterality: Bilateral;   ROBOT ASSISTED LAPAROSCOPIC RADICAL PROSTATECTOMY N/A 06/27/2014   Procedure: ROBOTIC ASSISTED LAPAROSCOPIC RADICAL PROSTATECTOMY LEVEL 3;  Surgeon: Heloise Purpura, MD;  Location: [facility];  Service: Urology;  Laterality: N/A;   TONSILLECTOMY      SOCIAL HISTORY:  Social History   Socioeconomic History   Marital status: Married    Spouse name: Not on file   Number of children: 0   Years of education: Not on file   Highest education level: Not on file  Occupational History    Occupation: truck driver    Comment: retired  Tobacco Use   Smoking status: Former    Packs/day: 1.00    Years: 40.00    Pack years: 40.00    Types: Cigarettes    Quit date: 06/21/2012    Years since quitting: 8.9   Smokeless tobacco: Never  Vaping Use   Vaping Use: Never used  Substance and Sexual Activity   Alcohol use: Yes    Comment: occasional   Drug use: No   Sexual activity: Not Currently  Other Topics Concern   Not on file  Social History Narrative   Not on file   Social Determinants of Health   Financial Resource Strain: Low Risk    Difficulty of Paying Living Expenses: Not hard at all  Food Insecurity: No Food Insecurity   Worried About Programme researcher, broadcasting/film/video in the Last Year: Never true   Ran Out of Food in the Last Year: Never true  Transportation Needs: No Transportation Needs   Lack of Transportation (Medical): No   Lack of Transportation (Non-Medical): No  Physical Activity: Inactive   Days of Exercise per Week: 0 days   Minutes of Exercise per Session: 0 min  Stress: No Stress Concern Present   Feeling of Stress : Not at all  Social Connections: Moderately Integrated   Frequency of Communication with Friends and Family: More than three times a week   Frequency of Social Gatherings with Friends and Family: Never   Attends Religious Services: Never   Marine scientist or Organizations: Yes   Attends Music therapist: Never   Marital Status: Married  Human resources officer Violence: Not At Risk   Fear of Current or Ex-Partner: No   Emotionally Abused: No   Physically Abused: No   Sexually Abused: No    FAMILY HISTORY:  Family History  Problem Relation Age of Onset   Dementia Mother    Thyroid disease Mother    Clotting disorder Mother    Kidney Stones Father    Stroke Brother    Alzheimer's disease Maternal Uncle    Tuberculosis Paternal Grandmother    Heart attack Maternal Uncle        x4   Heart attack Cousin        in his 34s    Cancer Neg Hx     CURRENT MEDICATIONS:  Current Outpatient Medications  Medication Sig Dispense Refill   albuterol (PROVENTIL HFA;VENTOLIN HFA) 108 (90 BASE) MCG/ACT inhaler Inhale 1-2 puffs into the lungs every 6 (six) hours as needed for wheezing or shortness of breath.     Ascorbic Acid (VITAMIN C PO) Take 1 tablet by mouth daily.     aspirin EC 81 MG tablet Take 81 mg by mouth 3 (three) times a week.     atorvastatin (LIPITOR) 40 MG tablet TAKE 1 TABLET BY MOUTH AT BEDTIME. (Patient taking differently: Take 40 mg by mouth at bedtime.) 30 tablet 6   BREO ELLIPTA 200-25 MCG/ACT AEPB Inhale 1 puff into the lungs daily.     Calcium Carb-Cholecalciferol (HM CALCIUM-VITAMIN D) 600-800 MG-UNIT TABS Take 1 tablet by mouth 2 (two) times daily. 1800 mg per day     Coenzyme Q10 (COQ10) 200 MG CAPS Take 200 mg by mouth daily.     denosumab (XGEVA) 120 MG/1.7ML SOLN injection Inject 120 mg into the skin every 30 (thirty) days. Last received 11/07/20     fluticasone-salmeterol (ADVAIR HFA) 115-21 MCG/ACT inhaler Inhale 2 puffs into the lungs 2 (two) times daily.      Ibuprofen-diphenhydrAMINE HCl (IBUPROFEN PM) 200-25 MG CAPS Take 1 tablet by mouth at bedtime as needed (sleep).     KRILL OIL PO Take 1 capsule by mouth daily.     Leuprolide Acetate, 3 Month, (LUPRON DEPOT, 59-MONTH, IM) Inject 1 Dose into the muscle every 3 (three) months.     lidocaine-prilocaine (EMLA) cream Apply to affected area once 30 g 3   Lutein 20 MG CAPS Take 20 mg by mouth daily.     methimazole (TAPAZOLE) 10 MG tablet Take 10 mg by mouth every other day.      metoprolol succinate (TOPROL-XL) 25 MG 24 hr tablet Take 1 tablet (25 mg total) by mouth every other day. 45 tablet 3   Multiple Vitamin (MULTIVITAMIN) tablet Take 1 tablet by mouth daily.     nitroGLYCERIN (NITROSTAT) 0.4 MG SL tablet Place 1 tablet (0.4 mg total) under the tongue every 5 (five) minutes as needed. 25 tablet 12   Potassium 99 MG TABS Take 99 mg by mouth  daily.     predniSONE (DELTASONE) 10 MG tablet Take 1 tablet (10 mg total) by mouth daily. 21 tablet 3  prochlorperazine (COMPAZINE) 10 MG tablet Take 1 tablet (10 mg total) by mouth every 6 (six) hours as needed (Nausea or vomiting). 30 tablet 1   Trolamine Salicylate (ASPERCREME EX) Apply 1 application topically daily as needed (pain). (Patient not taking: Reported on 05/15/2021)     No current facility-administered medications for this visit.    ALLERGIES:  Allergies  Allergen Reactions   Xtandi [Enzalutamide] Other (See Comments)    "Severe personality change" and blood clots   Penicillins Hives    PHYSICAL EXAM:  Performance status (ECOG): 1 - Symptomatic but completely ambulatory  Vitals:   05/21/21 1013  BP: (!) 132/56  Pulse: (!) 106  Resp: 20  Temp: 99 F (37.2 C)  SpO2: 100%   Wt Readings from Last 3 Encounters:  05/21/21 242 lb 6 oz (109.9 kg)  05/15/21 244 lb 9.6 oz (110.9 kg)  04/30/21 243 lb 6.2 oz (110.4 kg)   Physical Exam Vitals reviewed.  Constitutional:      Appearance: Normal appearance. He is obese.  Cardiovascular:     Rate and Rhythm: Normal rate and regular rhythm.     Pulses: Normal pulses.     Heart sounds: Normal heart sounds.  Pulmonary:     Effort: Pulmonary effort is normal.     Breath sounds: Normal breath sounds.  Neurological:     General: No focal deficit present.     Mental Status: He is alert and oriented to person, place, and time.  Psychiatric:        Mood and Affect: Mood normal.        Behavior: Behavior normal.    LABORATORY DATA:  I have reviewed the labs as listed.  CBC Latest Ref Rng & Units 05/21/2021 05/13/2021 05/05/2021  WBC 4.0 - 10.5 K/uL 8.2 9.2 10.2  Hemoglobin 13.0 - 17.0 g/dL 8.0(L) 7.2(L) 8.7(L)  Hematocrit 39.0 - 52.0 % 23.8(L) 22.4(L) 26.6(L)  Platelets 150 - 400 K/uL 73(L) 56(L) 78(L)   CMP Latest Ref Rng & Units 05/21/2021 04/30/2021 04/15/2021  Glucose 70 - 99 mg/dL 217(H) 174(H) 160(H)  BUN 8 - 23  mg/dL 36(H) 38(H) 33(H)  Creatinine 0.61 - 1.24 mg/dL 1.36(H) 1.27(H) 1.20  Sodium 135 - 145 mmol/L 137 137 136  Potassium 3.5 - 5.1 mmol/L 4.9 4.9 4.3  Chloride 98 - 111 mmol/L 108 104 108  CO2 22 - 32 mmol/L 20(L) 24 22  Calcium 8.9 - 10.3 mg/dL 8.3(L) 9.1 8.9  Total Protein 6.5 - 8.1 g/dL 6.5 6.7 6.1(L)  Total Bilirubin 0.3 - 1.2 mg/dL 0.6 0.7 0.4  Alkaline Phos 38 - 126 U/L 212(H) 228(H) 199(H)  AST 15 - 41 U/L 49(H) 46(H) 44(H)  ALT 0 - 44 U/L 63(H) 49(H) 63(H)    DIAGNOSTIC IMAGING:  I have independently reviewed the scans and discussed with the patient. NM Bone Scan Whole Body  Result Date: 04/27/2021 CLINICAL DATA:  Metastatic prostate cancer EXAM: NUCLEAR MEDICINE WHOLE BODY BONE SCAN TECHNIQUE: Whole body anterior and posterior images were obtained approximately 3 hours after intravenous injection of radiopharmaceutical. RADIOPHARMACEUTICALS:  20 mCi Technetium-82m MDP IV COMPARISON:  Bone scan 10/23/2020.  CT 04/24/2021 FINDINGS: Physiologic distribution of radiotracer with bilateral renal uptake and excretion. Significant interval progression of extensive radiotracer accumulation throughout the axial and appendicular skeleton compared to the previous bone scan. Progressive multifocal lesions include involvement throughout the thoracolumbar spine, bilateral ribs, pelvis, bilateral femurs and proximal tibia, and bilateral proximal humeri, left greater than right. IMPRESSION: Worsening diffuse osseous metastatic  disease compared to 10/23/2020. Electronically Signed   By: Davina Poke D.O.   On: 04/27/2021 11:44   CT Abdomen Pelvis W Contrast  Result Date: 04/26/2021 CLINICAL DATA:  Prostate cancer.  Assess treatment response. EXAM: CT ABDOMEN AND PELVIS WITH CONTRAST TECHNIQUE: Multidetector CT imaging of the abdomen and pelvis was performed using the standard protocol following bolus administration of intravenous contrast. CONTRAST:  181mL OMNIPAQUE IOHEXOL 300 MG/ML  SOLN  COMPARISON:  10/31/2020 FINDINGS: Lower chest: No acute abnormality. Hepatobiliary: Within the inferior right lobe of liver there is a hypodense mass measuring 4.2 x 3.1 cm, image 30/2. Adjacent lesion measures 2.5 by 2.3 cm, image 32/2. These are both new when compared with the previous exam and concerning for liver metastases. Tiny stones noted within the gallbladder fundus. Pancreas: Unremarkable. No pancreatic ductal dilatation or surrounding inflammatory changes. Spleen: Normal in size without focal abnormality. Adrenals/Urinary Tract: Adrenal glands are unremarkable. Kidneys are normal, without renal calculi, focal lesion, or hydronephrosis. Bladder is unremarkable. Stomach/Bowel: Stomach is within normal limits. The appendix is not visualized. No evidence of bowel wall thickening, distention, or inflammatory changes. Sigmoid diverticulosis. Vascular/Lymphatic: Aortic atherosclerosis. No enlarged abdominal or pelvic lymph nodes. Reproductive: Status post prostatectomy. Other: No abdominal wall hernia or abnormality. No abdominopelvic ascites. Musculoskeletal: Diffuse sclerotic bone metastases are again noted. Areas of sclerosis have progressed in the interval. IMPRESSION: 1. There are 2 new hypodense lesions within the inferior right lobe of liver concerning for liver metastases. 2. Diffuse sclerotic bone metastases. Areas of sclerosis have progressed in the interval. 3. Gallstones. 4. Aortic Atherosclerosis (ICD10-I70.0). Electronically Signed   By: Kerby Moors M.D.   On: 04/26/2021 10:34     ASSESSMENT:  1.  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 -Abiraterone and prednisone from 06/16/2018 through 11/06/2019 with progression. -Xtandi started around 11/21/2019, discontinued on 12/05/2019 due to mood changes. -ST elevation MI involving the right coronary artery on 12/10/2019, status post cardiac catheterization showing thrombotic occlusion  of the proximal RCA, successful PCI with stent placement. -Invitae testing was negative for germline mutations. -Guardant 360 showed AR amplification.  No other mutations. -Bicalutamide 50 mg daily from 04/02/2020 through 05/12/2020 with progression. -Bone scan on 06/02/2020 with increased activity throughout the sacrum with no other bony abnormalities. -CT CAP from 06/02/2020 with no visceral metastatic disease. -[facility] [place] is not compatible with Brilinta. -6 cycles of docetaxel from 06/23/2020 through 10/05/2020. - 5 treatments of radium-223 from 12/09/2020 through 04/13/2021.   PLAN:  1.  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 -CT scan and bone scan showed progressive disease with 2 hypodense lesions in the liver. - Cabazitaxel cycle 1 on 04/30/2021 at reduced dose. - Reviewed his labs today.  Hemoglobin is 8.0.  Creatinine is 1.36.  LFTs are elevated mildly. - Recommend 1 unit PRBC and 1 L of fluid. - We will check his labs and give cycle 2 on 06/01/2021 at a reduced dose. - RTC 06/22/2020. - We will continue to monitor CBC every 2 weeks for transfusion requirement. - Complains of green stools, slowly turning to brown.  C. difficile and GI panel were negative.    2.  Bone metastasis: -Continue calcium supplements and monthly denosumab.   3.  Peripheral neuropathy: -Does not report any tingling or numbness in the extremities.  His feet feel puffy as if he is walking on bubble wrap.  We will closely monitor as cabazitaxel  can worsen it.   4.  CAD and stents: -Continue Plavix.  Continue rehab.  5.  Body pains: -Continue ibuprofen every 8 hours as needed.   Orders placed this encounter:  No orders of the defined types were placed in this encounter.    Derek Jack, MD [place] [phone]   I, Thana Ates, am acting as a scribe for Dr. Derek Jack.  I, Derek Jack MD, have reviewed the above  documentation for accuracy and completeness, and I agree with the above.

## 2021-05-21 ENCOUNTER — Inpatient Hospital Stay (HOSPITAL_COMMUNITY): Payer: Medicare Other

## 2021-05-21 ENCOUNTER — Other Ambulatory Visit: Payer: Self-pay

## 2021-05-21 ENCOUNTER — Inpatient Hospital Stay (HOSPITAL_BASED_OUTPATIENT_CLINIC_OR_DEPARTMENT_OTHER): Payer: Medicare Other | Admitting: Hematology

## 2021-05-21 ENCOUNTER — Ambulatory Visit (HOSPITAL_COMMUNITY): Payer: Medicare Other | Admitting: Hematology

## 2021-05-21 ENCOUNTER — Encounter (HOSPITAL_COMMUNITY): Payer: Self-pay | Admitting: Hematology

## 2021-05-21 ENCOUNTER — Other Ambulatory Visit (HOSPITAL_COMMUNITY): Payer: Medicare Other

## 2021-05-21 ENCOUNTER — Ambulatory Visit (HOSPITAL_COMMUNITY): Payer: Medicare Other

## 2021-05-21 VITALS — BP 103/55 | HR 85 | Temp 98.7°F | Resp 19

## 2021-05-21 VITALS — BP 132/56 | HR 106 | Temp 99.0°F | Resp 20 | Ht 70.0 in | Wt 242.4 lb

## 2021-05-21 DIAGNOSIS — C61 Malignant neoplasm of prostate: Secondary | ICD-10-CM

## 2021-05-21 DIAGNOSIS — C7951 Secondary malignant neoplasm of bone: Secondary | ICD-10-CM

## 2021-05-21 DIAGNOSIS — D649 Anemia, unspecified: Secondary | ICD-10-CM

## 2021-05-21 DIAGNOSIS — Z5111 Encounter for antineoplastic chemotherapy: Secondary | ICD-10-CM | POA: Diagnosis not present

## 2021-05-21 LAB — COMPREHENSIVE METABOLIC PANEL
ALT: 63 U/L — ABNORMAL HIGH (ref 0–44)
AST: 49 U/L — ABNORMAL HIGH (ref 15–41)
Albumin: 2.9 g/dL — ABNORMAL LOW (ref 3.5–5.0)
Alkaline Phosphatase: 212 U/L — ABNORMAL HIGH (ref 38–126)
Anion gap: 9 (ref 5–15)
BUN: 36 mg/dL — ABNORMAL HIGH (ref 8–23)
CO2: 20 mmol/L — ABNORMAL LOW (ref 22–32)
Calcium: 8.3 mg/dL — ABNORMAL LOW (ref 8.9–10.3)
Chloride: 108 mmol/L (ref 98–111)
Creatinine, Ser: 1.36 mg/dL — ABNORMAL HIGH (ref 0.61–1.24)
GFR, Estimated: 57 mL/min — ABNORMAL LOW (ref >60)
Glucose, Bld: 217 mg/dL — ABNORMAL HIGH (ref 70–99)
Potassium: 4.9 mmol/L (ref 3.5–5.1)
Sodium: 137 mmol/L (ref 135–145)
Total Bilirubin: 0.6 mg/dL (ref 0.3–1.2)
Total Protein: 6.5 g/dL (ref 6.5–8.1)

## 2021-05-21 LAB — CBC WITH DIFFERENTIAL/PLATELET
Abs Immature Granulocytes: 0.48 10*3/uL — ABNORMAL HIGH (ref 0.00–0.07)
Basophils Absolute: 0.1 10*3/uL (ref 0.0–0.1)
Basophils Relative: 1 %
Eosinophils Absolute: 0.1 10*3/uL (ref 0.0–0.5)
Eosinophils Relative: 1 %
HCT: 23.8 % — ABNORMAL LOW (ref 39.0–52.0)
Hemoglobin: 8 g/dL — ABNORMAL LOW (ref 13.0–17.0)
Immature Granulocytes: 6 %
Lymphocytes Relative: 10 %
Lymphs Abs: 0.8 10*3/uL (ref 0.7–4.0)
MCH: 31.7 pg (ref 26.0–34.0)
MCHC: 33.6 g/dL (ref 30.0–36.0)
MCV: 94.4 fL (ref 80.0–100.0)
Monocytes Absolute: 0.4 10*3/uL (ref 0.1–1.0)
Monocytes Relative: 5 %
Neutro Abs: 6.4 10*3/uL (ref 1.7–7.7)
Neutrophils Relative %: 77 %
Platelets: 73 10*3/uL — ABNORMAL LOW (ref 150–400)
RBC: 2.52 MIL/uL — ABNORMAL LOW (ref 4.22–5.81)
RDW: 18.4 % — ABNORMAL HIGH (ref 11.5–15.5)
WBC: 8.2 10*3/uL (ref 4.0–10.5)
nRBC: 1.6 % — ABNORMAL HIGH (ref 0.0–0.2)

## 2021-05-21 LAB — PREPARE RBC (CROSSMATCH)

## 2021-05-21 LAB — SAMPLE TO BLOOD BANK

## 2021-05-21 MED ORDER — DENOSUMAB 120 MG/1.7ML ~~LOC~~ SOLN
120.0000 mg | Freq: Once | SUBCUTANEOUS | Status: AC
  Administered 2021-05-21: 120 mg via SUBCUTANEOUS
  Filled 2021-05-21: qty 1.7

## 2021-05-21 MED ORDER — ACETAMINOPHEN 325 MG PO TABS
650.0000 mg | ORAL_TABLET | Freq: Once | ORAL | Status: AC
  Administered 2021-05-21: 650 mg via ORAL
  Filled 2021-05-21: qty 2

## 2021-05-21 MED ORDER — SODIUM CHLORIDE 0.9% FLUSH
10.0000 mL | Freq: Once | INTRAVENOUS | Status: AC
  Administered 2021-05-21: 10 mL via INTRAVENOUS

## 2021-05-21 MED ORDER — HEPARIN SOD (PORK) LOCK FLUSH 100 UNIT/ML IV SOLN
500.0000 [IU] | Freq: Once | INTRAVENOUS | Status: AC
  Administered 2021-05-21: 500 [IU] via INTRAVENOUS

## 2021-05-21 MED ORDER — SODIUM CHLORIDE 0.9 % IV SOLN: Freq: Once | INTRAVENOUS | Status: AC

## 2021-05-21 MED ORDER — SODIUM CHLORIDE 0.9% IV SOLUTION
250.0000 mL | Freq: Once | INTRAVENOUS | Status: AC
  Administered 2021-05-21: 250 mL via INTRAVENOUS

## 2021-05-21 MED ORDER — DIPHENHYDRAMINE HCL 25 MG PO CAPS
25.0000 mg | ORAL_CAPSULE | Freq: Once | ORAL | Status: AC
  Administered 2021-05-21: 25 mg via ORAL
  Filled 2021-05-21: qty 1

## 2021-05-21 NOTE — Patient Instructions (Addendum)
[place] at [facility] Discharge Instructions   You were seen and examined today by Dr. Delton Coombes. We will hold Jevtana infusion today. You will receive 1 unit of blood and Xgeva injection today.  The results of all of your stool specimen testing were negative.  Return as scheduled in 2 weeks for lab work and possible treatment.      Thank you for choosing [place] at [facility] to provide your oncology and hematology care.  To afford each patient quality time with our provider, please arrive at least 15 minutes before your scheduled appointment time.   If you have a lab appointment with the [facility] please come in thru the Main Entrance and check in at the main information desk.  You need to re-schedule your appointment should you arrive 10 or more minutes late.  We strive to give you quality time with our providers, and arriving late affects you and other patients whose appointments are after yours.  Also, if you no show three or more times for appointments you may be dismissed from the clinic at the providers discretion.     Again, thank you for choosing [facility].  Our hope is that these requests will decrease the amount of time that you wait before being seen by our physicians.       _____________________________________________________________  Should you have questions after your visit to [facility], please contact our office at [phone] and follow the prompts.  Our office hours are 8:00 a.m. and 4:30 p.m. Monday - Friday.  Please note that voicemails left after 4:00 p.m. may not be returned until the following business day.  We are closed weekends and major holidays.  You do have access to a nurse 24-7, just call the main number to the clinic [phone] and do not press any options, hold on the line and a nurse will answer the phone.    For prescription refill requests, have your pharmacy  contact our office and allow 72 hours.    Due to Covid, you will need to wear a mask upon entering the hospital. If you do not have a mask, a mask will be given to you at the Main Entrance upon arrival. For doctor visits, patients may have 1 support person age 5 or older with them. For treatment visits, patients can not have anyone with them due to social distancing guidelines and our immunocompromised population.

## 2021-05-21 NOTE — Progress Notes (Unsigned)
Patient presents today for treatment and follow up visit with Dr. Delton Coombes.   Message received from A Anderson Rn/ Dr. Delton Coombes to hold treatment today, Give 1 Liter of normal saline over an hour. Give 1 unit of blood today for symptomatic anemia. HGB 8.0 today. Creatinine 1.36, BUN 36, platelets 73.

## 2021-05-22 ENCOUNTER — Ambulatory Visit (HOSPITAL_COMMUNITY): Payer: Medicare Other

## 2021-05-22 LAB — TYPE AND SCREEN
ABO/RH(D): O POS
Antibody Screen: NEGATIVE
Unit division: 0

## 2021-05-22 LAB — BPAM RBC
Blood Product Expiration Date: 202212102359
ISSUE DATE / TIME: 202212081212
Unit Type and Rh: 9500

## 2021-06-01 ENCOUNTER — Other Ambulatory Visit: Payer: Self-pay

## 2021-06-01 ENCOUNTER — Inpatient Hospital Stay (HOSPITAL_COMMUNITY): Payer: Medicare Other

## 2021-06-01 VITALS — BP 109/44 | HR 80 | Temp 97.9°F | Resp 18

## 2021-06-01 DIAGNOSIS — C61 Malignant neoplasm of prostate: Secondary | ICD-10-CM

## 2021-06-01 DIAGNOSIS — Z95828 Presence of other vascular implants and grafts: Secondary | ICD-10-CM

## 2021-06-01 DIAGNOSIS — Z5111 Encounter for antineoplastic chemotherapy: Secondary | ICD-10-CM | POA: Diagnosis not present

## 2021-06-01 DIAGNOSIS — D649 Anemia, unspecified: Secondary | ICD-10-CM

## 2021-06-01 LAB — COMPREHENSIVE METABOLIC PANEL
ALT: 109 U/L — ABNORMAL HIGH (ref 0–44)
AST: 64 U/L — ABNORMAL HIGH (ref 15–41)
Albumin: 2.4 g/dL — ABNORMAL LOW (ref 3.5–5.0)
Alkaline Phosphatase: 224 U/L — ABNORMAL HIGH (ref 38–126)
Anion gap: 10 (ref 5–15)
BUN: 27 mg/dL — ABNORMAL HIGH (ref 8–23)
CO2: 19 mmol/L — ABNORMAL LOW (ref 22–32)
Calcium: 8 mg/dL — ABNORMAL LOW (ref 8.9–10.3)
Chloride: 104 mmol/L (ref 98–111)
Creatinine, Ser: 1.28 mg/dL — ABNORMAL HIGH (ref 0.61–1.24)
GFR, Estimated: >60 mL/min (ref >60)
Glucose, Bld: 247 mg/dL — ABNORMAL HIGH (ref 70–99)
Potassium: 4.3 mmol/L (ref 3.5–5.1)
Sodium: 133 mmol/L — ABNORMAL LOW (ref 135–145)
Total Bilirubin: 0.6 mg/dL (ref 0.3–1.2)
Total Protein: 6.1 g/dL — ABNORMAL LOW (ref 6.5–8.1)

## 2021-06-01 LAB — CBC WITH DIFFERENTIAL/PLATELET
Abs Immature Granulocytes: 0.23 10*3/uL — ABNORMAL HIGH (ref 0.00–0.07)
Basophils Absolute: 0 10*3/uL (ref 0.0–0.1)
Basophils Relative: 0 %
Eosinophils Absolute: 0 10*3/uL (ref 0.0–0.5)
Eosinophils Relative: 1 %
HCT: 19.8 % — ABNORMAL LOW (ref 39.0–52.0)
Hemoglobin: 6.4 g/dL — CL (ref 13.0–17.0)
Immature Granulocytes: 5 %
Lymphocytes Relative: 9 %
Lymphs Abs: 0.4 10*3/uL — ABNORMAL LOW (ref 0.7–4.0)
MCH: 30.2 pg (ref 26.0–34.0)
MCHC: 32.3 g/dL (ref 30.0–36.0)
MCV: 93.4 fL (ref 80.0–100.0)
Monocytes Absolute: 0.5 10*3/uL (ref 0.1–1.0)
Monocytes Relative: 9 %
Neutro Abs: 3.6 10*3/uL (ref 1.7–7.7)
Neutrophils Relative %: 76 %
Platelets: 82 10*3/uL — ABNORMAL LOW (ref 150–400)
RBC: 2.12 MIL/uL — ABNORMAL LOW (ref 4.22–5.81)
RDW: 18.6 % — ABNORMAL HIGH (ref 11.5–15.5)
WBC: 4.8 10*3/uL (ref 4.0–10.5)
nRBC: 2.3 % — ABNORMAL HIGH (ref 0.0–0.2)

## 2021-06-01 LAB — SAMPLE TO BLOOD BANK

## 2021-06-01 LAB — PREPARE RBC (CROSSMATCH)

## 2021-06-01 LAB — MAGNESIUM: Magnesium: 1.8 mg/dL (ref 1.7–2.4)

## 2021-06-01 MED ORDER — ACETAMINOPHEN 325 MG PO TABS
650.0000 mg | ORAL_TABLET | Freq: Once | ORAL | Status: AC
  Administered 2021-06-01: 10:00:00 650 mg via ORAL
  Filled 2021-06-01: qty 2

## 2021-06-01 MED ORDER — SODIUM CHLORIDE 0.9% FLUSH
10.0000 mL | INTRAVENOUS | Status: AC | PRN
  Administered 2021-06-01: 14:00:00 10 mL

## 2021-06-01 MED ORDER — DIPHENHYDRAMINE HCL 25 MG PO CAPS
25.0000 mg | ORAL_CAPSULE | Freq: Once | ORAL | Status: AC
  Administered 2021-06-01: 10:00:00 25 mg via ORAL
  Filled 2021-06-01: qty 1

## 2021-06-01 MED ORDER — SODIUM CHLORIDE 0.9% IV SOLUTION
250.0000 mL | Freq: Once | INTRAVENOUS | Status: AC
  Administered 2021-06-01: 10:00:00 250 mL via INTRAVENOUS

## 2021-06-01 MED ORDER — HEPARIN SOD (PORK) LOCK FLUSH 100 UNIT/ML IV SOLN
500.0000 [IU] | Freq: Every day | INTRAVENOUS | Status: AC | PRN
  Administered 2021-06-01: 14:00:00 500 [IU]

## 2021-06-01 NOTE — Progress Notes (Signed)
CRITICAL VALUE ALERT Critical value received:  HGB 6.4 Date of notification:  06/01/21 Time of notification: 09:20 am  Critical value read back:  Yes.   Nurse who received alert:  B Strider Vallance RN MD notified time and response:  Dr. Delton Coombes. Orders received to transfuse 2 units of blood today.

## 2021-06-01 NOTE — Patient Instructions (Signed)
[facility]  Discharge Instructions: ?Thank you for choosing [facility] to provide your oncology and hematology care.  ?If you have a lab appointment with the Cancer Center, please come in thru the Main Entrance and check in at the main information desk. ? ?Wear comfortable clothing and clothing appropriate for easy access to any Portacath or PICC line.  ? ?We strive to give you quality time with your provider. You may need to reschedule your appointment if you arrive late (15 or more minutes).  Arriving late affects you and other patients whose appointments are after yours.  Also, if you miss three or more appointments without notifying the office, you may be dismissed from the clinic at the provider?s discretion.    ?  ?For prescription refill requests, have your pharmacy contact our office and allow 72 hours for refills to be completed.   ? ?Today you received 2 units of blood. ? ? ?BELOW ARE SYMPTOMS THAT SHOULD BE REPORTED IMMEDIATELY: ?*FEVER GREATER THAN 100.4 F (38 ?C) OR HIGHER ?*CHILLS OR SWEATING ?*NAUSEA AND VOMITING THAT IS NOT CONTROLLED WITH YOUR NAUSEA MEDICATION ?*UNUSUAL SHORTNESS OF BREATH ?*UNUSUAL BRUISING OR BLEEDING ?*URINARY PROBLEMS (pain or burning when urinating, or frequent urination) ?*BOWEL PROBLEMS (unusual diarrhea, constipation, pain near the anus) ?TENDERNESS IN MOUTH AND THROAT WITH OR WITHOUT PRESENCE OF ULCERS (sore throat, sores in mouth, or a toothache) ?UNUSUAL RASH, SWELLING OR PAIN  ?UNUSUAL VAGINAL DISCHARGE OR ITCHING  ? ?Items with * indicate a potential emergency and should be followed up as soon as possible or go to the Emergency Department if any problems should occur. ? ?Please show the CHEMOTHERAPY ALERT CARD or IMMUNOTHERAPY ALERT CARD at check-in to the Emergency Department and triage nurse. ? ?Should you have questions after your visit or need to cancel or reschedule your appointment, please contact [facility] [phone]   and follow the prompts.  Office hours are 8:00 a.m. to 4:30 p.m. Monday - Friday. Please note that voicemails left after 4:00 p.m. may not be returned until the following business day.  We are closed weekends and major holidays. You have access to a nurse at all times for urgent questions. Please call the main number to the clinic [phone] and follow the prompts. ? ?For any non-urgent questions, you may also contact your provider using MyChart. We now offer e-Visits for anyone 18 and older to request care online for non-urgent symptoms. For details visit [email or web address]. ?  ?Also download the MyChart app! Go to the app store, search "MyChart", open the app, select [facility], and log in with your MyChart username and password. ? ?Due to Covid, a mask is required upon entering the hospital/clinic. If you do not have a mask, one will be given to you upon arrival. For doctor visits, patients may have 1 support person aged 18 or older with them. For treatment visits, patients cannot have anyone with them due to current Covid guidelines and our immunocompromised population.  ?

## 2021-06-01 NOTE — Progress Notes (Signed)
Pt's treatment was held today per Dr.K pt c/o shortness of breath, fatigue, weakness. Pt's HGB was 6.4 today, Dr.K made aware and stated to give pt 2 units of blood and if symptoms are better tomorrow then proceed with treatment. Pt made aware and verbalized understanding.  2 units of blood given today per MD orders. Tolerated infusion without adverse affects. Vital signs stable. No complaints at this time. Discharged from clinic ambulatory in stable condition. Alert and oriented x 3. F/U with [facility] as scheduled.

## 2021-06-02 ENCOUNTER — Encounter (HOSPITAL_COMMUNITY): Payer: Self-pay

## 2021-06-02 ENCOUNTER — Inpatient Hospital Stay (HOSPITAL_COMMUNITY): Payer: Medicare Other

## 2021-06-02 VITALS — BP 122/60 | HR 97 | Temp 99.0°F | Resp 20

## 2021-06-02 DIAGNOSIS — C61 Malignant neoplasm of prostate: Secondary | ICD-10-CM

## 2021-06-02 DIAGNOSIS — Z5111 Encounter for antineoplastic chemotherapy: Secondary | ICD-10-CM | POA: Diagnosis not present

## 2021-06-02 DIAGNOSIS — Z95828 Presence of other vascular implants and grafts: Secondary | ICD-10-CM

## 2021-06-02 LAB — TYPE AND SCREEN
ABO/RH(D): O POS
Antibody Screen: NEGATIVE
Unit division: 0
Unit division: 0

## 2021-06-02 LAB — BPAM RBC
Blood Product Expiration Date: 202301202359
Blood Product Expiration Date: 202301202359
ISSUE DATE / TIME: 202212191027
ISSUE DATE / TIME: 202212191156
Unit Type and Rh: 5100
Unit Type and Rh: 5100

## 2021-06-02 MED ORDER — HEPARIN SOD (PORK) LOCK FLUSH 100 UNIT/ML IV SOLN
500.0000 [IU] | Freq: Once | INTRAVENOUS | Status: AC | PRN
  Administered 2021-06-02: 11:00:00 500 [IU]

## 2021-06-02 MED ORDER — SODIUM CHLORIDE 0.9 % IV SOLN: Freq: Once | INTRAVENOUS | Status: AC

## 2021-06-02 MED ORDER — SODIUM CHLORIDE 0.9 % IV SOLN
15.0000 mg/m2 | Freq: Once | INTRAVENOUS | Status: AC
  Administered 2021-06-02: 10:00:00 35 mg via INTRAVENOUS
  Filled 2021-06-02: qty 3.5

## 2021-06-02 MED ORDER — SODIUM CHLORIDE 0.9 % IV SOLN
10.0000 mg | Freq: Once | INTRAVENOUS | Status: AC
  Administered 2021-06-02: 09:00:00 10 mg via INTRAVENOUS
  Filled 2021-06-02: qty 10

## 2021-06-02 MED ORDER — DIPHENHYDRAMINE HCL 50 MG/ML IJ SOLN
25.0000 mg | Freq: Once | INTRAMUSCULAR | Status: AC
  Administered 2021-06-02: 09:00:00 25 mg via INTRAVENOUS
  Filled 2021-06-02: qty 1

## 2021-06-02 MED ORDER — FAMOTIDINE IN NACL 20-0.9 MG/50ML-% IV SOLN
20.0000 mg | Freq: Once | INTRAVENOUS | Status: AC
  Administered 2021-06-02: 09:00:00 20 mg via INTRAVENOUS
  Filled 2021-06-02: qty 50

## 2021-06-02 MED ORDER — PALONOSETRON HCL INJECTION 0.25 MG/5ML
0.2500 mg | Freq: Once | INTRAVENOUS | Status: AC
  Administered 2021-06-02: 09:00:00 0.25 mg via INTRAVENOUS
  Filled 2021-06-02: qty 5

## 2021-06-02 MED ORDER — SODIUM CHLORIDE 0.9% FLUSH
10.0000 mL | INTRAVENOUS | Status: DC | PRN
  Administered 2021-06-02 (×2): 10 mL

## 2021-06-02 MED ORDER — FAMOTIDINE 20 MG IN NS 100 ML IVPB: 20.0000 mg | Freq: Once | INTRAVENOUS | Status: DC

## 2021-06-02 NOTE — Progress Notes (Signed)
Patient reports for treatment today. Patient received 2 units of blood yesterday. No repeat labs today for treatment. Patient reports fatigue and SOB has improved since yesterdays blood transfusion. Okay for treatment today, plan signed by Dr. Delton Coombes. Patient tolerated Jevtana with no complaints voiced. Side effects with management reviewed understanding verbalized. Port site clean and dry with no bruising or swelling noted at site. Good blood return noted before and after administration of chemotherapy. Band aid applied. Patient left in satisfactory condition with VSS and no s/s of distress noted.

## 2021-06-02 NOTE — Patient Instructions (Signed)
[facility]  Discharge Instructions: Thank you for choosing [facility] to provide your oncology and hematology care.  If you have a lab appointment with the [facility], please come in thru the Main Entrance and check in at the main information desk.  Wear comfortable clothing and clothing appropriate for easy access to any Portacath or PICC line.   We strive to give you quality time with your provider. You may need to reschedule your appointment if you arrive late (15 or more minutes).  Arriving late affects you and other patients whose appointments are after yours.  Also, if you miss three or more appointments without notifying the office, you may be dismissed from the clinic at the providers discretion.      For prescription refill requests, have your pharmacy contact our office and allow 72 hours for refills to be completed.    Today you received the following chemotherapy and/or immunotherapy agents Jevtana, return as scheduled.   To help prevent nausea and vomiting after your treatment, we encourage you to take your nausea medication as directed.  BELOW ARE SYMPTOMS THAT SHOULD BE REPORTED IMMEDIATELY: *FEVER GREATER THAN 100.4 F (38 C) OR HIGHER *CHILLS OR SWEATING *NAUSEA AND VOMITING THAT IS NOT CONTROLLED WITH YOUR NAUSEA MEDICATION *UNUSUAL SHORTNESS OF BREATH *UNUSUAL BRUISING OR BLEEDING *URINARY PROBLEMS (pain or burning when urinating, or frequent urination) *BOWEL PROBLEMS (unusual diarrhea, constipation, pain near the anus) TENDERNESS IN MOUTH AND THROAT WITH OR WITHOUT PRESENCE OF ULCERS (sore throat, sores in mouth, or a toothache) UNUSUAL RASH, SWELLING OR PAIN  UNUSUAL VAGINAL DISCHARGE OR ITCHING   Items with * indicate a potential emergency and should be followed up as soon as possible or go to the Emergency Department if any problems should occur.  Please show the CHEMOTHERAPY ALERT CARD or IMMUNOTHERAPY ALERT CARD at check-in to the  Emergency Department and triage nurse.  Should you have questions after your visit or need to cancel or reschedule your appointment, please contact [facility] [phone]  and follow the prompts.  Office hours are 8:00 a.m. to 4:30 p.m. Monday - Friday. Please note that voicemails left after 4:00 p.m. may not be returned until the following business day.  We are closed weekends and major holidays. You have access to a nurse at all times for urgent questions. Please call the main number to the clinic [phone] and follow the prompts.  For any non-urgent questions, you may also contact your provider using MyChart. We now offer e-Visits for anyone 81 and older to request care online for non-urgent symptoms. For details visit [email or web address].   Also download the MyChart app! Go to the app store, search "MyChart", open the app, select [facility], and log in with your MyChart username and password.  Due to Covid, a mask is required upon entering the hospital/clinic. If you do not have a mask, one will be given to you upon arrival. For doctor visits, patients may have 1 support person aged 39 or older with them. For treatment visits, patients cannot have anyone with them due to current Covid guidelines and our immunocompromised population.

## 2021-06-03 ENCOUNTER — Inpatient Hospital Stay (HOSPITAL_COMMUNITY): Payer: Medicare Other

## 2021-06-03 ENCOUNTER — Ambulatory Visit (HOSPITAL_COMMUNITY): Payer: Medicare Other

## 2021-06-03 ENCOUNTER — Encounter (HOSPITAL_COMMUNITY): Payer: Self-pay

## 2021-06-03 ENCOUNTER — Other Ambulatory Visit: Payer: Self-pay

## 2021-06-03 VITALS — BP 111/50 | HR 88 | Temp 97.5°F

## 2021-06-03 DIAGNOSIS — C61 Malignant neoplasm of prostate: Secondary | ICD-10-CM

## 2021-06-03 DIAGNOSIS — Z95828 Presence of other vascular implants and grafts: Secondary | ICD-10-CM

## 2021-06-03 DIAGNOSIS — Z5111 Encounter for antineoplastic chemotherapy: Secondary | ICD-10-CM | POA: Diagnosis not present

## 2021-06-03 MED ORDER — PEGFILGRASTIM-CBQV 6 MG/0.6ML ~~LOC~~ SOSY
6.0000 mg | PREFILLED_SYRINGE | Freq: Once | SUBCUTANEOUS | Status: AC
  Administered 2021-06-03: 14:00:00 6 mg via SUBCUTANEOUS
  Filled 2021-06-03: qty 0.6

## 2021-06-03 NOTE — Progress Notes (Signed)
Patient tolerated injection with no complaints voiced.  Site clean and dry with no bruising or swelling noted at site.  See MAR for details.  Band aid applied.  Patient stable during and after injection.  Vss with discharge and left in satisfactory condition with no s/s of distress noted.  

## 2021-06-03 NOTE — Patient Instructions (Signed)
[facility]  Discharge Instructions: Thank you for choosing [facility] to provide your oncology and hematology care.  If you have a lab appointment with the Cancer Center, please come in thru the Main Entrance and check in at the main information desk.  Wear comfortable clothing and clothing appropriate for easy access to any Portacath or PICC line.   We strive to give you quality time with your provider. You may need to reschedule your appointment if you arrive late (15 or more minutes).  Arriving late affects you and other patients whose appointments are after yours.  Also, if you miss three or more appointments without notifying the office, you may be dismissed from the clinic at the provider's discretion.      For prescription refill requests, have your pharmacy contact our office and allow 72 hours for refills to be completed.        To help prevent nausea and vomiting after your treatment, we encourage you to take your nausea medication as directed.  BELOW ARE SYMPTOMS THAT SHOULD BE REPORTED IMMEDIATELY: *FEVER GREATER THAN 100.4 F (38 C) OR HIGHER *CHILLS OR SWEATING *NAUSEA AND VOMITING THAT IS NOT CONTROLLED WITH YOUR NAUSEA MEDICATION *UNUSUAL SHORTNESS OF BREATH *UNUSUAL BRUISING OR BLEEDING *URINARY PROBLEMS (pain or burning when urinating, or frequent urination) *BOWEL PROBLEMS (unusual diarrhea, constipation, pain near the anus) TENDERNESS IN MOUTH AND THROAT WITH OR WITHOUT PRESENCE OF ULCERS (sore throat, sores in mouth, or a toothache) UNUSUAL RASH, SWELLING OR PAIN  UNUSUAL VAGINAL DISCHARGE OR ITCHING   Items with * indicate a potential emergency and should be followed up as soon as possible or go to the Emergency Department if any problems should occur.  Please show the CHEMOTHERAPY ALERT CARD or IMMUNOTHERAPY ALERT CARD at check-in to the Emergency Department and triage nurse.  Should you have questions after your visit or need to cancel  or reschedule your appointment, please contact [facility] [phone]  and follow the prompts.  Office hours are 8:00 a.m. to 4:30 p.m. Monday - Friday. Please note that voicemails left after 4:00 p.m. may not be returned until the following business day.  We are closed weekends and major holidays. You have access to a nurse at all times for urgent questions. Please call the main number to the clinic [phone] and follow the prompts.  For any non-urgent questions, you may also contact your provider using MyChart. We now offer e-Visits for anyone 18 and older to request care online for non-urgent symptoms. For details visit [email or web address].   Also download the MyChart app! Go to the app store, search "MyChart", open the app, select [facility], and log in with your MyChart username and password.  Due to Covid, a mask is required upon entering the hospital/clinic. If you do not have a mask, one will be given to you upon arrival. For doctor visits, patients may have 1 support person aged 18 or older with them. For treatment visits, patients cannot have anyone with them due to current Covid guidelines and our immunocompromised population.  

## 2021-06-04 ENCOUNTER — Ambulatory Visit (HOSPITAL_COMMUNITY): Payer: Medicare Other

## 2021-06-08 ENCOUNTER — Encounter (HOSPITAL_COMMUNITY): Payer: Self-pay | Admitting: Hematology

## 2021-06-09 ENCOUNTER — Other Ambulatory Visit (HOSPITAL_COMMUNITY): Payer: Self-pay | Admitting: *Deleted

## 2021-06-09 ENCOUNTER — Other Ambulatory Visit: Payer: Self-pay

## 2021-06-09 ENCOUNTER — Inpatient Hospital Stay (HOSPITAL_COMMUNITY): Payer: Medicare Other

## 2021-06-09 DIAGNOSIS — D649 Anemia, unspecified: Secondary | ICD-10-CM

## 2021-06-09 DIAGNOSIS — Z5111 Encounter for antineoplastic chemotherapy: Secondary | ICD-10-CM | POA: Diagnosis not present

## 2021-06-09 LAB — SAMPLE TO BLOOD BANK

## 2021-06-09 LAB — CBC WITH DIFFERENTIAL/PLATELET
Abs Immature Granulocytes: 1.04 10*3/uL — ABNORMAL HIGH (ref 0.00–0.07)
Basophils Absolute: 0.1 10*3/uL (ref 0.0–0.1)
Basophils Relative: 1 %
Eosinophils Absolute: 0 10*3/uL (ref 0.0–0.5)
Eosinophils Relative: 0 %
HCT: 23.8 % — ABNORMAL LOW (ref 39.0–52.0)
Hemoglobin: 7.7 g/dL — ABNORMAL LOW (ref 13.0–17.0)
Immature Granulocytes: 9 %
Lymphocytes Relative: 10 %
Lymphs Abs: 1.2 10*3/uL (ref 0.7–4.0)
MCH: 30.4 pg (ref 26.0–34.0)
MCHC: 32.4 g/dL (ref 30.0–36.0)
MCV: 94.1 fL (ref 80.0–100.0)
Monocytes Absolute: 1 10*3/uL (ref 0.1–1.0)
Monocytes Relative: 8 %
Neutro Abs: 8.7 10*3/uL — ABNORMAL HIGH (ref 1.7–7.7)
Neutrophils Relative %: 72 %
Platelets: 37 10*3/uL — ABNORMAL LOW (ref 150–400)
RBC: 2.53 MIL/uL — ABNORMAL LOW (ref 4.22–5.81)
RDW: 17.2 % — ABNORMAL HIGH (ref 11.5–15.5)
WBC: 12 10*3/uL — ABNORMAL HIGH (ref 4.0–10.5)
nRBC: 1.5 % — ABNORMAL HIGH (ref 0.0–0.2)

## 2021-06-09 LAB — PREPARE RBC (CROSSMATCH)

## 2021-06-09 NOTE — Progress Notes (Signed)
Per Dr Delton Coombes, we will schedule patient for 1 unit of PRBC this week.

## 2021-06-09 NOTE — Progress Notes (Signed)
Wife called stating that patient was having his typical symptoms of blood count being low, to include sob, pallor and severe weakness.  Per Dr. Delton Coombes, we will obtain labs today and assess for needs.  Wife aware and will bring him for labs this afternoon.

## 2021-06-10 ENCOUNTER — Telehealth: Payer: Self-pay | Admitting: Cardiology

## 2021-06-10 ENCOUNTER — Inpatient Hospital Stay (HOSPITAL_COMMUNITY): Payer: Medicare Other

## 2021-06-10 VITALS — BP 113/50 | HR 93 | Temp 98.1°F | Resp 20

## 2021-06-10 DIAGNOSIS — D649 Anemia, unspecified: Secondary | ICD-10-CM

## 2021-06-10 DIAGNOSIS — Z5111 Encounter for antineoplastic chemotherapy: Secondary | ICD-10-CM | POA: Diagnosis not present

## 2021-06-10 MED ORDER — ACETAMINOPHEN 325 MG PO TABS
650.0000 mg | ORAL_TABLET | Freq: Once | ORAL | Status: AC
  Administered 2021-06-10: 08:00:00 650 mg via ORAL
  Filled 2021-06-10: qty 2

## 2021-06-10 MED ORDER — DIPHENHYDRAMINE HCL 25 MG PO CAPS
25.0000 mg | ORAL_CAPSULE | Freq: Once | ORAL | Status: AC
  Administered 2021-06-10: 08:00:00 25 mg via ORAL
  Filled 2021-06-10: qty 1

## 2021-06-10 MED ORDER — HEPARIN SOD (PORK) LOCK FLUSH 100 UNIT/ML IV SOLN
500.0000 [IU] | Freq: Every day | INTRAVENOUS | Status: AC | PRN
  Administered 2021-06-10: 11:00:00 500 [IU]

## 2021-06-10 MED ORDER — SODIUM CHLORIDE 0.9% IV SOLUTION
250.0000 mL | Freq: Once | INTRAVENOUS | Status: AC
  Administered 2021-06-10: 08:00:00 250 mL via INTRAVENOUS

## 2021-06-10 MED ORDER — SODIUM CHLORIDE 0.9% FLUSH
10.0000 mL | INTRAVENOUS | Status: AC | PRN
  Administered 2021-06-10: 11:00:00 10 mL

## 2021-06-10 NOTE — Telephone Encounter (Signed)
I reassured patient that his need for blood transfusions would not negatively hurt his heart. He will hopefully feel bette as his hgb rises.

## 2021-06-10 NOTE — Progress Notes (Signed)
One unit of blood given today per MD orders.   Patient tolerated it well without problems. Vitals stable and discharged home from clinic ambulatory. Follow up as scheduled.

## 2021-06-10 NOTE — Telephone Encounter (Signed)
Have question about having to have so many blood transfusions   Will this do damage to my heart.

## 2021-06-10 NOTE — Patient Instructions (Signed)
[facility]  Discharge Instructions: Thank you for choosing [facility] to provide your oncology and hematology care.  If you have a lab appointment with the Cancer Center, please come in thru the Main Entrance and check in at the main information desk.  Wear comfortable clothing and clothing appropriate for easy access to any Portacath or PICC line.   We strive to give you quality time with your provider. You may need to reschedule your appointment if you arrive late (15 or more minutes).  Arriving late affects you and other patients whose appointments are after yours.  Also, if you miss three or more appointments without notifying the office, you may be dismissed from the clinic at the provider's discretion.      For prescription refill requests, have your pharmacy contact our office and allow 72 hours for refills to be completed.        To help prevent nausea and vomiting after your treatment, we encourage you to take your nausea medication as directed.  BELOW ARE SYMPTOMS THAT SHOULD BE REPORTED IMMEDIATELY: *FEVER GREATER THAN 100.4 F (38 C) OR HIGHER *CHILLS OR SWEATING *NAUSEA AND VOMITING THAT IS NOT CONTROLLED WITH YOUR NAUSEA MEDICATION *UNUSUAL SHORTNESS OF BREATH *UNUSUAL BRUISING OR BLEEDING *URINARY PROBLEMS (pain or burning when urinating, or frequent urination) *BOWEL PROBLEMS (unusual diarrhea, constipation, pain near the anus) TENDERNESS IN MOUTH AND THROAT WITH OR WITHOUT PRESENCE OF ULCERS (sore throat, sores in mouth, or a toothache) UNUSUAL RASH, SWELLING OR PAIN  UNUSUAL VAGINAL DISCHARGE OR ITCHING   Items with * indicate a potential emergency and should be followed up as soon as possible or go to the Emergency Department if any problems should occur.  Please show the CHEMOTHERAPY ALERT CARD or IMMUNOTHERAPY ALERT CARD at check-in to the Emergency Department and triage nurse.  Should you have questions after your visit or need to cancel  or reschedule your appointment, please contact [facility] [phone]  and follow the prompts.  Office hours are 8:00 a.m. to 4:30 p.m. Monday - Friday. Please note that voicemails left after 4:00 p.m. may not be returned until the following business day.  We are closed weekends and major holidays. You have access to a nurse at all times for urgent questions. Please call the main number to the clinic [phone] and follow the prompts.  For any non-urgent questions, you may also contact your provider using MyChart. We now offer e-Visits for anyone 18 and older to request care online for non-urgent symptoms. For details visit [email or web address].   Also download the MyChart app! Go to the app store, search "MyChart", open the app, select [facility], and log in with your MyChart username and password.  Due to Covid, a mask is required upon entering the hospital/clinic. If you do not have a mask, one will be given to you upon arrival. For doctor visits, patients may have 1 support person aged 18 or older with them. For treatment visits, patients cannot have anyone with them due to current Covid guidelines and our immunocompromised population.  

## 2021-06-11 LAB — TYPE AND SCREEN
ABO/RH(D): O POS
Antibody Screen: NEGATIVE
Unit division: 0

## 2021-06-11 LAB — BPAM RBC
Blood Product Expiration Date: 202301212359
ISSUE DATE / TIME: 202212280857
Unit Type and Rh: 5100

## 2021-06-17 ENCOUNTER — Other Ambulatory Visit (HOSPITAL_COMMUNITY): Payer: Self-pay | Admitting: *Deleted

## 2021-06-17 DIAGNOSIS — D649 Anemia, unspecified: Secondary | ICD-10-CM

## 2021-06-17 NOTE — Progress Notes (Unsigned)
Offered lab appointment for today, however does not want to do labs until tomorrow due to the rain, which was already scheduled.

## 2021-06-17 NOTE — Progress Notes (Signed)
Patient called to report fatigue, SOB and pallor.  Will bring him in today for CBCD and BB sample and treat as necessary.

## 2021-06-18 ENCOUNTER — Other Ambulatory Visit: Payer: Self-pay

## 2021-06-18 ENCOUNTER — Inpatient Hospital Stay (HOSPITAL_COMMUNITY): Payer: Medicare Other | Attending: Hematology

## 2021-06-18 DIAGNOSIS — C7951 Secondary malignant neoplasm of bone: Secondary | ICD-10-CM | POA: Insufficient documentation

## 2021-06-18 DIAGNOSIS — Z7902 Long term (current) use of antithrombotics/antiplatelets: Secondary | ICD-10-CM | POA: Diagnosis not present

## 2021-06-18 DIAGNOSIS — D630 Anemia in neoplastic disease: Secondary | ICD-10-CM | POA: Diagnosis not present

## 2021-06-18 DIAGNOSIS — C61 Malignant neoplasm of prostate: Secondary | ICD-10-CM | POA: Diagnosis present

## 2021-06-18 DIAGNOSIS — Z791 Long term (current) use of non-steroidal anti-inflammatories (NSAID): Secondary | ICD-10-CM | POA: Diagnosis not present

## 2021-06-18 DIAGNOSIS — Z79899 Other long term (current) drug therapy: Secondary | ICD-10-CM | POA: Diagnosis not present

## 2021-06-18 DIAGNOSIS — D649 Anemia, unspecified: Secondary | ICD-10-CM

## 2021-06-18 DIAGNOSIS — I2510 Atherosclerotic heart disease of native coronary artery without angina pectoris: Secondary | ICD-10-CM | POA: Insufficient documentation

## 2021-06-18 DIAGNOSIS — G620 Drug-induced polyneuropathy: Secondary | ICD-10-CM | POA: Insufficient documentation

## 2021-06-18 DIAGNOSIS — R52 Pain, unspecified: Secondary | ICD-10-CM | POA: Diagnosis not present

## 2021-06-18 LAB — CBC WITH DIFFERENTIAL/PLATELET
Abs Immature Granulocytes: 0.81 10*3/uL — ABNORMAL HIGH (ref 0.00–0.07)
Basophils Absolute: 0.1 10*3/uL (ref 0.0–0.1)
Basophils Relative: 1 %
Eosinophils Absolute: 0 10*3/uL (ref 0.0–0.5)
Eosinophils Relative: 0 %
HCT: 26.8 % — ABNORMAL LOW (ref 39.0–52.0)
Hemoglobin: 8.7 g/dL — ABNORMAL LOW (ref 13.0–17.0)
Immature Granulocytes: 11 %
Lymphocytes Relative: 7 %
Lymphs Abs: 0.5 10*3/uL — ABNORMAL LOW (ref 0.7–4.0)
MCH: 30.9 pg (ref 26.0–34.0)
MCHC: 32.5 g/dL (ref 30.0–36.0)
MCV: 95 fL (ref 80.0–100.0)
Monocytes Absolute: 0.6 10*3/uL (ref 0.1–1.0)
Monocytes Relative: 8 %
Neutro Abs: 5.1 10*3/uL (ref 1.7–7.7)
Neutrophils Relative %: 73 %
Platelets: 33 10*3/uL — ABNORMAL LOW (ref 150–400)
RBC: 2.82 MIL/uL — ABNORMAL LOW (ref 4.22–5.81)
RDW: 18.3 % — ABNORMAL HIGH (ref 11.5–15.5)
WBC: 7.1 10*3/uL (ref 4.0–10.5)
nRBC: 4.8 % — ABNORMAL HIGH (ref 0.0–0.2)

## 2021-06-18 LAB — SAMPLE TO BLOOD BANK

## 2021-06-18 NOTE — Progress Notes (Unsigned)
Called patient at home and reported HGB 8.7 today. Informed patient he has appointment Monday for repeat labs, Dr. Delton Coombes f/u office visit and treatment. Patient has complaints of being short of breath on exertion. Patient requests to speak to Dr. Delton Coombes on Monday and patient informed he has an appointment to speak with Dr. Delton Coombes.

## 2021-06-22 ENCOUNTER — Telehealth: Payer: Self-pay | Admitting: Cardiology

## 2021-06-22 ENCOUNTER — Inpatient Hospital Stay (HOSPITAL_COMMUNITY): Payer: Medicare Other

## 2021-06-22 ENCOUNTER — Telehealth (HOSPITAL_COMMUNITY): Payer: Self-pay | Admitting: *Deleted

## 2021-06-22 ENCOUNTER — Other Ambulatory Visit: Payer: Self-pay

## 2021-06-22 ENCOUNTER — Inpatient Hospital Stay (HOSPITAL_BASED_OUTPATIENT_CLINIC_OR_DEPARTMENT_OTHER): Payer: Medicare Other | Admitting: Hematology

## 2021-06-22 VITALS — BP 96/47 | HR 82 | Temp 97.8°F | Resp 20

## 2021-06-22 DIAGNOSIS — D649 Anemia, unspecified: Secondary | ICD-10-CM

## 2021-06-22 DIAGNOSIS — Z95828 Presence of other vascular implants and grafts: Secondary | ICD-10-CM

## 2021-06-22 DIAGNOSIS — C61 Malignant neoplasm of prostate: Secondary | ICD-10-CM

## 2021-06-22 DIAGNOSIS — C7951 Secondary malignant neoplasm of bone: Secondary | ICD-10-CM | POA: Diagnosis not present

## 2021-06-22 LAB — CBC WITH DIFFERENTIAL/PLATELET
Band Neutrophils: 3 %
Basophils Absolute: 0 10*3/uL (ref 0.0–0.1)
Basophils Relative: 0 %
Eosinophils Absolute: 0 10*3/uL (ref 0.0–0.5)
Eosinophils Relative: 0 %
HCT: 19.8 % — ABNORMAL LOW (ref 39.0–52.0)
Hemoglobin: 6.4 g/dL — CL (ref 13.0–17.0)
Lymphocytes Relative: 3 %
Lymphs Abs: 0.3 10*3/uL — ABNORMAL LOW (ref 0.7–4.0)
MCH: 30.5 pg (ref 26.0–34.0)
MCHC: 32.3 g/dL (ref 30.0–36.0)
MCV: 94.3 fL (ref 80.0–100.0)
Metamyelocytes Relative: 5 %
Monocytes Absolute: 0.2 10*3/uL (ref 0.1–1.0)
Monocytes Relative: 2 %
Myelocytes: 1 %
Neutro Abs: 9.2 10*3/uL — ABNORMAL HIGH (ref 1.7–7.7)
Neutrophils Relative %: 86 %
Platelets: 47 10*3/uL — ABNORMAL LOW (ref 150–400)
RBC: 2.1 MIL/uL — ABNORMAL LOW (ref 4.22–5.81)
RDW: 18.6 % — ABNORMAL HIGH (ref 11.5–15.5)
WBC: 10.3 10*3/uL (ref 4.0–10.5)
nRBC: 2 /100 WBC — ABNORMAL HIGH
nRBC: 3.3 % — ABNORMAL HIGH (ref 0.0–0.2)

## 2021-06-22 LAB — TSH: TSH: 0.859 u[IU]/mL (ref 0.350–4.500)

## 2021-06-22 LAB — PSA: Prostatic Specific Antigen: 2496 ng/mL — ABNORMAL HIGH (ref 0.00–4.00)

## 2021-06-22 LAB — COMPREHENSIVE METABOLIC PANEL
ALT: 67 U/L — ABNORMAL HIGH (ref 0–44)
AST: 57 U/L — ABNORMAL HIGH (ref 15–41)
Albumin: 2.7 g/dL — ABNORMAL LOW (ref 3.5–5.0)
Alkaline Phosphatase: 272 U/L — ABNORMAL HIGH (ref 38–126)
Anion gap: 9 (ref 5–15)
BUN: 38 mg/dL — ABNORMAL HIGH (ref 8–23)
CO2: 18 mmol/L — ABNORMAL LOW (ref 22–32)
Calcium: 7.6 mg/dL — ABNORMAL LOW (ref 8.9–10.3)
Chloride: 105 mmol/L (ref 98–111)
Creatinine, Ser: 1.54 mg/dL — ABNORMAL HIGH (ref 0.61–1.24)
GFR, Estimated: 49 mL/min — ABNORMAL LOW (ref >60)
Glucose, Bld: 259 mg/dL — ABNORMAL HIGH (ref 70–99)
Potassium: 4.6 mmol/L (ref 3.5–5.1)
Sodium: 132 mmol/L — ABNORMAL LOW (ref 135–145)
Total Bilirubin: 0.9 mg/dL (ref 0.3–1.2)
Total Protein: 6 g/dL — ABNORMAL LOW (ref 6.5–8.1)

## 2021-06-22 LAB — SAMPLE TO BLOOD BANK

## 2021-06-22 LAB — MAGNESIUM: Magnesium: 1.8 mg/dL (ref 1.7–2.4)

## 2021-06-22 LAB — PREPARE RBC (CROSSMATCH)

## 2021-06-22 MED ORDER — HEPARIN SOD (PORK) LOCK FLUSH 100 UNIT/ML IV SOLN
500.0000 [IU] | Freq: Every day | INTRAVENOUS | Status: AC | PRN
  Administered 2021-06-22: 500 [IU]

## 2021-06-22 MED ORDER — SODIUM CHLORIDE 0.9% FLUSH
10.0000 mL | INTRAVENOUS | Status: AC | PRN
  Administered 2021-06-22: 10 mL

## 2021-06-22 MED ORDER — SODIUM CHLORIDE 0.9% IV SOLUTION
250.0000 mL | Freq: Once | INTRAVENOUS | Status: AC
  Administered 2021-06-22: 250 mL via INTRAVENOUS

## 2021-06-22 MED ORDER — SODIUM CHLORIDE 0.9 % IV SOLN: INTRAVENOUS | Status: DC

## 2021-06-22 MED ORDER — ACETAMINOPHEN 325 MG PO TABS
650.0000 mg | ORAL_TABLET | Freq: Once | ORAL | Status: AC
  Administered 2021-06-22: 650 mg via ORAL
  Filled 2021-06-22: qty 2

## 2021-06-22 MED ORDER — SODIUM CHLORIDE 0.9% FLUSH: 10.0000 mL | INTRAVENOUS | Status: DC | PRN

## 2021-06-22 MED ORDER — DIPHENHYDRAMINE HCL 25 MG PO CAPS
25.0000 mg | ORAL_CAPSULE | Freq: Once | ORAL | Status: AC
  Administered 2021-06-22: 25 mg via ORAL
  Filled 2021-06-22: qty 1

## 2021-06-22 NOTE — Telephone Encounter (Signed)
platelets are low.. blood test shows hemigloban is 8/7 platelets are really low.. on exertion his hr is very high and pt feels as if he is going to fall out. Not sure if she should allow him to do chemo under these circumstances this morning... please advise

## 2021-06-22 NOTE — Progress Notes (Signed)
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 Radical resection of prostate on 06/27/2014. 2. Zytiga from 05/19/2018 to 11/09/2019. 3. Xtandi from 11/21/2019 to 12/05/2019, stopped due to mood changes  NGS Results: Guardant AR amplification, MSI normal, TMB 16 Muts/Mb  CURRENT THERAPY: Docetaxel every 3 weeks; Xgeva monthly; Lupron every 3 months  BRIEF ONCOLOGIC HISTORY:  Oncology History  Prostate cancer ([place])  02/06/2014 Procedure   Prostate biopsy by Dr. Clyde Lundborg   02/11/2014 Pathology Results   Prostatic adenocarcinoma identified in 5 of 6 prostate specimens with Gleason pattern showing primary pattern-grade 4, secondary pattern grade 3.  Total Gleason score equals 7.  Proportion of prostatic tissue involved by tumor: 70%.   05/03/2014 Imaging   Bone scan- Negative     06/27/2014 Procedure   Radical resection of prostate by Dr. Raynelle Bring   07/29/2014 Pathology Results   1. Prostate, radical resection - PROSTATIC ADENOCARCINOMA, GLEASON SCORE 4 + 3 = 7. - RIGHT AND LEFT PROSTATE INVOLVED. - RIGHT AND LEFT SEMINAL VESICLES INVOLVED BY TUMOR. - EXTRACAPSULAR EXTENSION BY TUMOR. - TUMOR EXTENDS INTO BLADDER NECK TISSUE. - MARGINS NOT INVOLVED. 2. Lymph nodes, regional resection, left pelvic - THREE BENIGN LYMPH NODES (0/3). 3. Lymph nodes, regional resection, right pelvic - FOUR BENIGN LYMPH NODES (0/4).   11/16/2016 Imaging   Bone scan- New areas of increased uptake superolateral to the left orbit (faint), at S1, and in the posterolateral aspect of the left sixth rib.    11/25/2016 Imaging   CT CAP- 1. Status post radical prostatectomy. No findings to suggest local recurrence of disease or definite extraskeletal metastatic disease in the  chest, abdomen or pelvis. However, there are several osseous lesions, as above, concerning for metastatic disease to the bones. 2. Hepatic steatosis. 3. Aortic atherosclerosis, in addition to 2 vessel coronary artery disease. Please note that although the presence of coronary artery calcium documents the presence of coronary artery disease, the severity of this disease and any potential stenosis cannot be assessed on this non-gated CT examination. Assessment for potential risk factor modification, dietary therapy or pharmacologic therapy may be warranted, if clinically indicated. 4. Additional incidental findings, as above.    Genetic Testing   Guardant 360 Results:         06/23/2020 - 10/08/2020 Chemotherapy   Patient is on Treatment Plan : PROSTATE Docetaxel + Prednisone q21d     04/30/2021 -  Chemotherapy   Patient is on Treatment Plan : PROSTATE Cabazitaxel + Prednisone q21d       CANCER STAGING:  Cancer Staging  Prostate cancer [facility]) Staging form: Prostate, AJCC 7th Edition - Pathologic stage from 07/29/2014: Stage III (T3b, N0, cM0, Gleason 7) - Signed by Baird Cancer, PA-C on 11/09/2016 - Clinical: Stage IV (T3, N0, M1, PSA: Less than 10, Gleason 7) - Signed by [name], MD on 12/21/2016 - Pathologic: No stage assigned - Unsigned    INTERVAL HISTORY:  Mr. Peretti 66 y.o. male returns for routine follow-up and consideration for next cycle of chemotherapy.   Due for cycle #3 of Cabazitaxel + Prednisone today.   Overall, he tells me he has been feeling pretty well.  He reports fatigue and weakness. He reports continued numbness in his feet bilaterally. He  reports increased anxiety and depression, and his sleep is poor. He reports SOB with exertion. His appetite has been good, but he reports 1 episode of diarrhea and decreased appetite. He reports his stool is now brown. He denies abdominal pain, and he reports pain in his buttock, leg, and arm pains.   Overall, he  feels ready for next cycle of chemo today.   REVIEW OF SYSTEMS:  Review of Systems  Constitutional:  Positive for fatigue (depleted). Negative for appetite change.  Respiratory:  Positive for shortness of breath (with exertion).   Cardiovascular:  Positive for palpitations.  Gastrointestinal:  Positive for diarrhea.  Musculoskeletal:  Positive for arthralgias (5/10 all over achy) and myalgias (cramping hands and legs).  Neurological:  Positive for dizziness, headaches and numbness.  Psychiatric/Behavioral:  Positive for depression and sleep disturbance. The patient is nervous/anxious.   All other systems reviewed and are negative.   PAST MEDICAL/SURGICAL HISTORY:  Past Medical History:  Diagnosis Date   Acute ST elevation myocardial infarction (STEMI) of inferior wall ([facility]) 11/2019   Asthma    COPD (chronic obstructive pulmonary disease) (HCC)    Coronary artery disease    DES to mid LAD August 2014 ([facility]); overlapping DESx2 prox-mid RCA 11/2019   Essential hypertension    Hyperthyroidism    NSTEMI (non-ST elevated myocardial infarction) ([facility]) 2014   Port-A-Cath in place 06/17/2020   Prostate cancer ([place])    Type 2 diabetes mellitus ([place])    Past Surgical History:  Procedure Laterality Date   APPENDECTOMY     CORONARY/GRAFT ACUTE MI REVASCULARIZATION N/A 12/10/2019   Procedure: Coronary/Graft Acute MI Revascularization;  Surgeon: Nelva Bush, MD;  Location: [facility];  Service: Cardiovascular;  Laterality: N/A;   IR IMAGING GUIDED PORT INSERTION  06/19/2020   LEFT HEART CATH AND CORONARY ANGIOGRAPHY N/A 12/10/2019   Procedure: LEFT HEART CATH AND CORONARY ANGIOGRAPHY;  Surgeon: Nelva Bush, MD;  Location: [facility];  Service: Cardiovascular;  Laterality: N/A;   LYMPHADENECTOMY Bilateral 06/27/2014   Procedure: LYMPHADENECTOMY;  Surgeon: Raynelle Bring, MD;  Location: [facility];  Service: Urology;  Laterality: Bilateral;   ROBOT ASSISTED LAPAROSCOPIC RADICAL  PROSTATECTOMY N/A 06/27/2014   Procedure: ROBOTIC ASSISTED LAPAROSCOPIC RADICAL PROSTATECTOMY LEVEL 3;  Surgeon: Raynelle Bring, MD;  Location: [facility];  Service: Urology;  Laterality: N/A;   TONSILLECTOMY       SOCIAL HISTORY:  Social History   Socioeconomic History   Marital status: Married    Spouse name: Not on file   Number of children: 0   Years of education: Not on file   Highest education level: Not on file  Occupational History   Occupation: truck driver    Comment: retired  Tobacco Use   Smoking status: Former    Packs/day: 1.00    Years: 40.00    Pack years: 40.00    Types: Cigarettes    Quit date: 06/21/2012    Years since quitting: 9.0   Smokeless tobacco: Never  Vaping Use   Vaping Use: Never used  Substance and Sexual Activity   Alcohol use: Yes    Comment: occasional   Drug use: No   Sexual activity: Not Currently  Other Topics Concern   Not on file  Social History Narrative   Not on file   Social Determinants of Health   Financial Resource Strain: Low Risk    Difficulty of Paying Living Expenses: Not hard at all  Food Insecurity: No Food Insecurity  Worried About Programme researcher, broadcasting/film/video in the Last Year: Never true   Ran Out of Food in the Last Year: Never true  Transportation Needs: Not on file  Physical Activity: Not on file  Stress: No Stress Concern Present   Feeling of Stress : Not at all  Social Connections: Moderately Integrated   Frequency of Communication with Friends and Family: More than three times a week   Frequency of Social Gatherings with Friends and Family: Never   Attends Religious Services: Never   Database administrator or Organizations: Yes   Attends Banker Meetings: Never   Marital Status: Married  Catering manager Violence: Not on file    FAMILY HISTORY:  Family History  Problem Relation Age of Onset   Dementia Mother    Thyroid disease Mother    Clotting disorder Mother    Kidney Stones Father    Stroke  Brother    Alzheimer's disease Maternal Uncle    Tuberculosis Paternal Grandmother    Heart attack Maternal Uncle        x4   Heart attack Cousin        in his 36s   Cancer Neg Hx     CURRENT MEDICATIONS:  Outpatient Encounter Medications as of 06/22/2021  Medication Sig Note   albuterol (PROVENTIL HFA;VENTOLIN HFA) 108 (90 BASE) MCG/ACT inhaler Inhale 1-2 puffs into the lungs every 6 (six) hours as needed for wheezing or shortness of breath. (Patient not taking: Reported on 06/22/2021)    Ascorbic Acid (VITAMIN C PO) Take 1 tablet by mouth daily.    aspirin EC 81 MG tablet Take 81 mg by mouth 3 (three) times a week.    atorvastatin (LIPITOR) 40 MG tablet TAKE 1 TABLET BY MOUTH AT BEDTIME. (Patient taking differently: Take 40 mg by mouth at bedtime.)    BREO ELLIPTA 200-25 MCG/ACT AEPB Inhale 1 puff into the lungs daily.    Calcium Carb-Cholecalciferol (HM CALCIUM-VITAMIN D) 600-800 MG-UNIT TABS Take 1 tablet by mouth 2 (two) times daily. 1800 mg per day    Coenzyme Q10 (COQ10) 200 MG CAPS Take 200 mg by mouth daily.    denosumab (XGEVA) 120 MG/1.7ML SOLN injection Inject 120 mg into the skin every 30 (thirty) days. Last received 11/07/20    fluticasone-salmeterol (ADVAIR HFA) 115-21 MCG/ACT inhaler Inhale 2 puffs into the lungs 2 (two) times daily.     Ibuprofen-diphenhydrAMINE HCl (IBUPROFEN PM) 200-25 MG CAPS Take 1 tablet by mouth at bedtime as needed (sleep).    KRILL OIL PO Take 1 capsule by mouth daily.    Leuprolide Acetate, 3 Month, (LUPRON DEPOT, 52-MONTH, IM) Inject 1 Dose into the muscle every 3 (three) months.    Lutein 20 MG CAPS Take 20 mg by mouth daily.    methimazole (TAPAZOLE) 10 MG tablet Take 10 mg by mouth every other day.     methocarbamol (ROBAXIN) 750 MG tablet Take 750 mg by mouth at bedtime as needed.    metoprolol succinate (TOPROL-XL) 25 MG 24 hr tablet Take 1 tablet (25 mg total) by mouth every other day.    Multiple Vitamin (MULTIVITAMIN) tablet Take 1 tablet by  mouth daily.    Potassium 99 MG TABS Take 99 mg by mouth daily.    predniSONE (DELTASONE) 10 MG tablet Take 1 tablet (10 mg total) by mouth daily.    Trolamine Salicylate (ASPERCREME EX) Apply 1 application topically daily as needed (pain). (Patient not taking: Reported on 06/22/2021)  lidocaine-prilocaine (EMLA) cream Apply to affected area once    nitroGLYCERIN (NITROSTAT) 0.4 MG SL tablet Place 1 tablet (0.4 mg total) under the tongue every 5 (five) minutes as needed. 06/19/2020: Never used as of 06/19/20.   prochlorperazine (COMPAZINE) 10 MG tablet Take 1 tablet (10 mg total) by mouth every 6 (six) hours as needed (Nausea or vomiting).    No facility-administered encounter medications on file as of 06/22/2021.    ALLERGIES:  Allergies  Allergen Reactions   Xtandi [Enzalutamide] Other (See Comments)    "Severe personality change" and blood clots   Penicillins Hives     PHYSICAL EXAM:  ECOG Performance status: 1  There were no vitals filed for this visit. There were no vitals filed for this visit. Physical Exam Vitals reviewed.  Constitutional:      Appearance: Normal appearance.  Cardiovascular:     Rate and Rhythm: Normal rate and regular rhythm.     Heart sounds: Normal heart sounds.  Pulmonary:     Effort: Pulmonary effort is normal.     Breath sounds: Normal breath sounds.  Neurological:     Mental Status: He is alert and oriented to person, place, and time.     LABORATORY DATA:  I have reviewed the labs as listed.  CBC    Component Value Date/Time   WBC 10.3 06/22/2021 0955   RBC 2.10 (L) 06/22/2021 0955   HGB 6.4 (LL) 06/22/2021 0955   HCT 19.8 (L) 06/22/2021 0955   PLT 47 (L) 06/22/2021 0955   MCV 94.3 06/22/2021 0955   MCH 30.5 06/22/2021 0955   MCHC 32.3 06/22/2021 0955   RDW 18.6 (H) 06/22/2021 0955   LYMPHSABS 0.3 (L) 06/22/2021 0955   MONOABS 0.2 06/22/2021 0955   EOSABS 0.0 06/22/2021 0955   BASOSABS 0.0 06/22/2021 0955   CMP Latest Ref Rng & Units  06/22/2021 06/01/2021 05/21/2021  Glucose 70 - 99 mg/dL 259(H) 247(H) 217(H)  BUN 8 - 23 mg/dL 38(H) 27(H) 36(H)  Creatinine 0.61 - 1.24 mg/dL 1.54(H) 1.28(H) 1.36(H)  Sodium 135 - 145 mmol/L 132(L) 133(L) 137  Potassium 3.5 - 5.1 mmol/L 4.6 4.3 4.9  Chloride 98 - 111 mmol/L 105 104 108  CO2 22 - 32 mmol/L 18(L) 19(L) 20(L)  Calcium 8.9 - 10.3 mg/dL 7.6(L) 8.0(L) 8.3(L)  Total Protein 6.5 - 8.1 g/dL 6.0(L) 6.1(L) 6.5  Total Bilirubin 0.3 - 1.2 mg/dL 0.9 0.6 0.6  Alkaline Phos 38 - 126 U/L 272(H) 224(H) 212(H)  AST 15 - 41 U/L 57(H) 64(H) 49(H)  ALT 0 - 44 U/L 67(H) 109(H) 63(H)    DIAGNOSTIC IMAGING:  I have independently reviewed the scans and discussed with the patient.    ASSESSMENT:  1.  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 -Abiraterone and prednisone from 06/16/2018 through 11/06/2019 with progression. -Xtandi started around 11/21/2019, discontinued on 12/05/2019 due to mood changes. -ST elevation MI involving the right coronary artery on 12/10/2019, status post cardiac catheterization showing thrombotic occlusion of the proximal RCA, successful PCI with stent placement. -[employer]ae testing was negative for germline mutations. -Guardant 360 showed AR amplification.  No other mutations. -Bicalutamide 50 mg daily from 04/02/2020 through 05/12/2020 with progression. -Bone scan on 06/02/2020 with increased activity throughout the sacrum with no other bony abnormalities. -CT CAP from 06/02/2020 with no visceral metastatic disease. -[place] is not compatible with Brilinta. -6 cycles of docetaxel from 06/23/2020 through 10/05/2020. - 5 treatments of radium-223 from  12/09/2020 through 04/13/2021. - CT scan and bone scan showed progressive disease with 2 hypodense liver lesions. - Cabazitaxel cycle 1 started on 04/30/2021     PLAN:  1.  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 - Reviewed CBC today which showed  hemoglobin 6.4.  Platelets are 47.  White count is normal at 10.3.  LFTs show elevated AST and ALT.  Total bilirubin was normal. - We will hold off on cycle 3 today.  We will give 2 units of PRBC. - We will plan to check his CBC every week for possible blood transfusion to keep his hemoglobin at or above 9. - Recommend RTC next week for treatment cycle 3.  I plan to repeat CT CAP and bone scan after next cycle.     2.  Bone metastasis: - Continue calcium and monthly denosumab.   3.  Peripheral neuropathy: - Does not report any tingling or numbness in extremities. - Feet feel like he is walking on bubble wrap.  We will closely monitor it.  No worsening since cabazitaxel.   4.  CAD and stents: - Continue Plavix.  5.  Body pains: - Continue ibuprofen every 8 hours as needed.    Orders placed this encounter:  Orders Placed This Encounter  Procedures   CT CHEST ABDOMEN PELVIS W CONTRAST   NM Bone Scan Whole Body   PSA    I, Thana Ates, am acting as a scribe for Dr. Derek Jack.   I, Derek Jack MD, have reviewed the above documentation for accuracy and completeness, and I agree with the above.  Derek Jack, MD [place] [phone]

## 2021-06-22 NOTE — Patient Instructions (Signed)
[facility]  Discharge Instructions: ?Thank you for choosing [facility] to provide your oncology and hematology care.  ?If you have a lab appointment with the Cancer Center, please come in thru the Main Entrance and check in at the main information desk. ? ?Wear comfortable clothing and clothing appropriate for easy access to any Portacath or PICC line.  ? ?We strive to give you quality time with your provider. You may need to reschedule your appointment if you arrive late (15 or more minutes).  Arriving late affects you and other patients whose appointments are after yours.  Also, if you miss three or more appointments without notifying the office, you may be dismissed from the clinic at the provider?s discretion.    ?  ?For prescription refill requests, have your pharmacy contact our office and allow 72 hours for refills to be completed.   ? ?Today you received 2 units of blood. ? ? ?BELOW ARE SYMPTOMS THAT SHOULD BE REPORTED IMMEDIATELY: ?*FEVER GREATER THAN 100.4 F (38 ?C) OR HIGHER ?*CHILLS OR SWEATING ?*NAUSEA AND VOMITING THAT IS NOT CONTROLLED WITH YOUR NAUSEA MEDICATION ?*UNUSUAL SHORTNESS OF BREATH ?*UNUSUAL BRUISING OR BLEEDING ?*URINARY PROBLEMS (pain or burning when urinating, or frequent urination) ?*BOWEL PROBLEMS (unusual diarrhea, constipation, pain near the anus) ?TENDERNESS IN MOUTH AND THROAT WITH OR WITHOUT PRESENCE OF ULCERS (sore throat, sores in mouth, or a toothache) ?UNUSUAL RASH, SWELLING OR PAIN  ?UNUSUAL VAGINAL DISCHARGE OR ITCHING  ? ?Items with * indicate a potential emergency and should be followed up as soon as possible or go to the Emergency Department if any problems should occur. ? ?Please show the CHEMOTHERAPY ALERT CARD or IMMUNOTHERAPY ALERT CARD at check-in to the Emergency Department and triage nurse. ? ?Should you have questions after your visit or need to cancel or reschedule your appointment, please contact [facility] [phone]   and follow the prompts.  Office hours are 8:00 a.m. to 4:30 p.m. Monday - Friday. Please note that voicemails left after 4:00 p.m. may not be returned until the following business day.  We are closed weekends and major holidays. You have access to a nurse at all times for urgent questions. Please call the main number to the clinic [phone] and follow the prompts. ? ?For any non-urgent questions, you may also contact your provider using MyChart. We now offer e-Visits for anyone 18 and older to request care online for non-urgent symptoms. For details visit [email or web address]. ?  ?Also download the MyChart app! Go to the app store, search "MyChart", open the app, select [facility], and log in with your MyChart username and password. ? ?Due to Covid, a mask is required upon entering the hospital/clinic. If you do not have a mask, one will be given to you upon arrival. For doctor visits, patients may have 1 support person aged 18 or older with them. For treatment visits, patients cannot have anyone with them due to current Covid guidelines and our immunocompromised population.  ?

## 2021-06-22 NOTE — Progress Notes (Signed)
Pt presents today for treatment per provider's order. Pt's hemoglobin 6.4 today. Treatment held today per Dr.K.  2 units of blood to be given today per Dr.K. The goal for hgb is to keep pt at 9 or above per Dr.K.  2 units of blood given today per MD orders. Tolerated infusion without adverse affects. Vital signs stable. No complaints at this time. Discharged from clinic via wheelchair in stable condition. Alert and oriented x 3. F/U with [facility] as scheduled.

## 2021-06-22 NOTE — Telephone Encounter (Signed)
Defer to hematology / oncology.  See epic notes - patient given 2 units PRB's per Dr. Delton Coombes.

## 2021-06-22 NOTE — Progress Notes (Signed)
Patients port flushed without difficulty.  Good blood return noted with no bruising or swelling noted at site.  Patient remains accessed for possible treatment today.  Patient is in stable condition.

## 2021-06-22 NOTE — Progress Notes (Signed)
CRITICAL VALUE STICKER  CRITICAL VALUE: Hgb 6.4  RECEIVER (on-site recipient of call):Charlyne Petrin, RN  DATE & TIME NOTIFIED: 06/22/2021 at 1015  MESSENGER (representative from lab): Lab Madison Hickman  MD NOTIFIED: Delton Coombes  TIME OF NOTIFICATION:06/22/2021  RESPONSE:  See oncologist for follow up visit and treatment.  Transfuse one unit of blood per standing orders Dr. Delton Coombes.

## 2021-06-22 NOTE — Telephone Encounter (Signed)
Wife and patient called.  Both very anxious stating that his HR is in the 140's and he cannot walk.  Advised to go to the ER and they refused saying that they were coming up here to be seen. Dr Delton Coombes aware and will assess upon arrival.

## 2021-06-22 NOTE — Patient Instructions (Addendum)
[place] [facility] Discharge Instructions   You were seen and examined today by Dr. Delton Coombes.  We reviewed your lab work.  Your hemoglobin is very low at 6.4.  We will hold your treatment this week and give you 2 units of blood today.  We will send a prescription for Ritalin.  You can take a small dose of this every morning to help with your fatigue.   We will plan to give you treatment next week.  We will monitor your lab work weekly and give blood transfusions as needed.  We will repeat a CT scan after your next treatment.  Return as scheduled for lab work and treatment.      Thank you for choosing [facility] to provide your oncology and hematology care.  To afford each patient quality time with our provider, please arrive at least 15 minutes before your scheduled appointment time.   If you have a lab appointment with the [facility] please come in thru the Main Entrance and check in at the main information desk.  You need to re-schedule your appointment should you arrive 10 or more minutes late.  We strive to give you quality time with our providers, and arriving late affects you and other patients whose appointments are after yours.  Also, if you no show three or more times for appointments you may be dismissed from the clinic at the providers discretion.     Again, thank you for choosing [facility].  Our hope is that these requests will decrease the amount of time that you wait before being seen by our physicians.       _____________________________________________________________  Should you have questions after your visit to [facility], please contact our office at [phone] and follow the prompts.  Our office hours are 8:00 a.m. and 4:30 p.m. Monday - Friday.  Please note that voicemails left after 4:00 p.m. may not be returned until the following business day.  We are closed  weekends and major holidays.  You do have access to a nurse 24-7, just call the main number to the clinic [phone] and do not press any options, hold on the line and a nurse will answer the phone.    For prescription refill requests, have your pharmacy contact our office and allow 72 hours.    Due to Covid, you will need to wear a mask upon entering the hospital. If you do not have a mask, a mask will be given to you at the Main Entrance upon arrival. For doctor visits, patients may have 1 support person age 54 or older with them. For treatment visits, patients can not have anyone with them due to social distancing guidelines and our immunocompromised population.

## 2021-06-23 ENCOUNTER — Encounter (HOSPITAL_COMMUNITY): Payer: Self-pay | Admitting: Hematology

## 2021-06-23 LAB — TYPE AND SCREEN
ABO/RH(D): O POS
Antibody Screen: NEGATIVE
Unit division: 0
Unit division: 0

## 2021-06-23 LAB — BPAM RBC
Blood Product Expiration Date: 202302142359
Blood Product Expiration Date: 202302142359
ISSUE DATE / TIME: 202301091151
ISSUE DATE / TIME: 202301091330
Unit Type and Rh: 5100
Unit Type and Rh: 5100

## 2021-06-24 ENCOUNTER — Ambulatory Visit (HOSPITAL_COMMUNITY): Payer: Medicare Other

## 2021-06-25 ENCOUNTER — Encounter (HOSPITAL_COMMUNITY): Payer: Self-pay

## 2021-06-29 ENCOUNTER — Encounter (HOSPITAL_COMMUNITY): Payer: Self-pay

## 2021-06-29 ENCOUNTER — Inpatient Hospital Stay (HOSPITAL_COMMUNITY): Payer: Medicare Other

## 2021-06-29 ENCOUNTER — Other Ambulatory Visit: Payer: Self-pay

## 2021-06-29 VITALS — BP 101/55 | HR 81 | Temp 98.1°F | Resp 20

## 2021-06-29 DIAGNOSIS — C61 Malignant neoplasm of prostate: Secondary | ICD-10-CM | POA: Insufficient documentation

## 2021-06-29 DIAGNOSIS — C7951 Secondary malignant neoplasm of bone: Secondary | ICD-10-CM

## 2021-06-29 DIAGNOSIS — D649 Anemia, unspecified: Secondary | ICD-10-CM

## 2021-06-29 LAB — COMPREHENSIVE METABOLIC PANEL
ALT: 123 U/L — ABNORMAL HIGH (ref 0–44)
AST: 61 U/L — ABNORMAL HIGH (ref 15–41)
Albumin: 2.3 g/dL — ABNORMAL LOW (ref 3.5–5.0)
Alkaline Phosphatase: 278 U/L — ABNORMAL HIGH (ref 38–126)
Anion gap: 8 (ref 5–15)
BUN: 29 mg/dL — ABNORMAL HIGH (ref 8–23)
CO2: 19 mmol/L — ABNORMAL LOW (ref 22–32)
Calcium: 7.4 mg/dL — ABNORMAL LOW (ref 8.9–10.3)
Chloride: 105 mmol/L (ref 98–111)
Creatinine, Ser: 1.3 mg/dL — ABNORMAL HIGH (ref 0.61–1.24)
GFR, Estimated: >60 mL/min (ref >60)
Glucose, Bld: 297 mg/dL — ABNORMAL HIGH (ref 70–99)
Potassium: 4.3 mmol/L (ref 3.5–5.1)
Sodium: 132 mmol/L — ABNORMAL LOW (ref 135–145)
Total Bilirubin: 0.9 mg/dL (ref 0.3–1.2)
Total Protein: 5.6 g/dL — ABNORMAL LOW (ref 6.5–8.1)

## 2021-06-29 LAB — SAMPLE TO BLOOD BANK

## 2021-06-29 LAB — MAGNESIUM: Magnesium: 1.8 mg/dL (ref 1.7–2.4)

## 2021-06-29 LAB — PREPARE RBC (CROSSMATCH)

## 2021-06-29 MED ORDER — ACETAMINOPHEN 325 MG PO TABS
650.0000 mg | ORAL_TABLET | Freq: Once | ORAL | Status: AC
  Administered 2021-06-29: 650 mg via ORAL
  Filled 2021-06-29: qty 2

## 2021-06-29 MED ORDER — DIPHENHYDRAMINE HCL 25 MG PO CAPS
25.0000 mg | ORAL_CAPSULE | Freq: Once | ORAL | Status: AC
  Administered 2021-06-29: 25 mg via ORAL
  Filled 2021-06-29: qty 1

## 2021-06-29 MED ORDER — SODIUM CHLORIDE 0.9% IV SOLUTION
250.0000 mL | Freq: Once | INTRAVENOUS | Status: AC
  Administered 2021-06-29: 250 mL via INTRAVENOUS

## 2021-06-29 MED ORDER — SODIUM CHLORIDE 0.9% FLUSH
10.0000 mL | INTRAVENOUS | Status: AC | PRN
  Administered 2021-06-29: 10 mL

## 2021-06-29 MED ORDER — HEPARIN SOD (PORK) LOCK FLUSH 100 UNIT/ML IV SOLN
500.0000 [IU] | Freq: Every day | INTRAVENOUS | Status: AC | PRN
  Administered 2021-06-29: 500 [IU]

## 2021-06-29 NOTE — Progress Notes (Signed)
CRITICAL VALUE ALERT Critical value received: HGB 6.6 Date of notification:  06/29/2021 Time of notification: 09:20 am  Critical value read back:  Yes.   Nurse who received alert:  B. Renesmee Raine RN MD notified time and response:  Dr. Delton Coombes / Lora Havens RN , primary nurse R. [facility] Post Therapist, sports.   Per Dr. Delton Coombes standing orders infuse 2 units of blood today . Message received to hold tx today and return to clinic next week for possible treatment. Per Dr. Delton Coombes platelet range 70-80 thousand goal for next weeks treatment.

## 2021-06-29 NOTE — Patient Instructions (Signed)
[facility]  Discharge Instructions: Thank you for choosing [facility] to provide your oncology and hematology care.  If you have a lab appointment with the [facility], please come in thru the Main Entrance and check in at the main information desk.  Wear comfortable clothing and clothing appropriate for easy access to any Portacath or PICC line.   We strive to give you quality time with your provider. You may need to reschedule your appointment if you arrive late (15 or more minutes).  Arriving late affects you and other patients whose appointments are after yours.  Also, if you miss three or more appointments without notifying the office, you may be dismissed from the clinic at the providers discretion.      For prescription refill requests, have your pharmacy contact our office and allow 72 hours for refills to be completed.    Today you received the following 2 units PRBCs return as scheduled.   To help prevent nausea and vomiting after your treatment, we encourage you to take your nausea medication as directed.  BELOW ARE SYMPTOMS THAT SHOULD BE REPORTED IMMEDIATELY: *FEVER GREATER THAN 100.4 F (38 C) OR HIGHER *CHILLS OR SWEATING *NAUSEA AND VOMITING THAT IS NOT CONTROLLED WITH YOUR NAUSEA MEDICATION *UNUSUAL SHORTNESS OF BREATH *UNUSUAL BRUISING OR BLEEDING *URINARY PROBLEMS (pain or burning when urinating, or frequent urination) *BOWEL PROBLEMS (unusual diarrhea, constipation, pain near the anus) TENDERNESS IN MOUTH AND THROAT WITH OR WITHOUT PRESENCE OF ULCERS (sore throat, sores in mouth, or a toothache) UNUSUAL RASH, SWELLING OR PAIN  UNUSUAL VAGINAL DISCHARGE OR ITCHING   Items with * indicate a potential emergency and should be followed up as soon as possible or go to the Emergency Department if any problems should occur.  Please show the CHEMOTHERAPY ALERT CARD or IMMUNOTHERAPY ALERT CARD at check-in to the Emergency Department and triage  nurse.  Should you have questions after your visit or need to cancel or reschedule your appointment, please contact [facility] [phone]  and follow the prompts.  Office hours are 8:00 a.m. to 4:30 p.m. Monday - Friday. Please note that voicemails left after 4:00 p.m. may not be returned until the following business day.  We are closed weekends and major holidays. You have access to a nurse at all times for urgent questions. Please call the main number to the clinic [phone] and follow the prompts.  For any non-urgent questions, you may also contact your provider using MyChart. We now offer e-Visits for anyone 22 and older to request care online for non-urgent symptoms. For details visit [email or web address].   Also download the MyChart app! Go to the app store, search "MyChart", open the app, select [facility], and log in with your MyChart username and password.  Due to Covid, a mask is required upon entering the hospital/clinic. If you do not have a mask, one will be given to you upon arrival. For doctor visits, patients may have 1 support person aged 3 or older with them. For treatment visits, patients cannot have anyone with them due to current Covid guidelines and our immunocompromised population.

## 2021-06-29 NOTE — Progress Notes (Signed)
Patient presents today for possible treatment. Patient's Hemoglobin 6.6, ALT 123, Platelets 55. Per Dr. Delton Coombes we will hold treatment today and infuse 2 units of PRBCs. Dr. Delton Coombes would like Platelets 70-80K. Will transfuse 2 untis of PRBCs as ordered. Patient tolerated 2 units of PRBCs with no complaints voiced. Side effects with management reviewed with understanding verbalized. Port site clean and dry with no bruising or swelling noted at site. Good blood return noted before and after administration of therapy. Band aid applied. Patient left in satisfactory condition with VSS and no s/s of distress noted.

## 2021-06-30 LAB — TYPE AND SCREEN
ABO/RH(D): O POS
Antibody Screen: NEGATIVE
Unit division: 0
Unit division: 0

## 2021-06-30 LAB — CBC WITH DIFFERENTIAL/PLATELET
Band Neutrophils: 2 %
Basophils Absolute: 0 10*3/uL (ref 0.0–0.1)
Basophils Relative: 0 %
Eosinophils Absolute: 0 10*3/uL (ref 0.0–0.5)
Eosinophils Relative: 0 %
HCT: 20.2 % — ABNORMAL LOW (ref 39.0–52.0)
Hemoglobin: 6.6 g/dL — CL (ref 13.0–17.0)
Lymphocytes Relative: 12 %
Lymphs Abs: 0.7 10*3/uL (ref 0.7–4.0)
MCH: 31.4 pg (ref 26.0–34.0)
MCHC: 32.7 g/dL (ref 30.0–36.0)
MCV: 96.2 fL (ref 80.0–100.0)
Metamyelocytes Relative: 1 %
Monocytes Absolute: 0.2 10*3/uL (ref 0.1–1.0)
Monocytes Relative: 4 %
Myelocytes: 2 %
Neutro Abs: 4.5 10*3/uL (ref 1.7–7.7)
Neutrophils Relative %: 79 %
Platelets: 55 10*3/uL — ABNORMAL LOW (ref 150–400)
RBC: 2.1 MIL/uL — ABNORMAL LOW (ref 4.22–5.81)
RDW: 18.6 % — ABNORMAL HIGH (ref 11.5–15.5)
WBC: 5.5 10*3/uL (ref 4.0–10.5)
nRBC: 3 /100 WBC — ABNORMAL HIGH
nRBC: 3.5 % — ABNORMAL HIGH (ref 0.0–0.2)

## 2021-06-30 LAB — BPAM RBC
Blood Product Expiration Date: 202302162359
Blood Product Expiration Date: 202302162359
ISSUE DATE / TIME: 202301161020
ISSUE DATE / TIME: 202301161226
Unit Type and Rh: 5100
Unit Type and Rh: 5100

## 2021-07-01 ENCOUNTER — Ambulatory Visit (HOSPITAL_COMMUNITY): Payer: Medicare Other

## 2021-07-06 ENCOUNTER — Other Ambulatory Visit (HOSPITAL_COMMUNITY): Payer: Medicare Other

## 2021-07-06 ENCOUNTER — Inpatient Hospital Stay (HOSPITAL_COMMUNITY): Payer: Medicare Other

## 2021-07-06 ENCOUNTER — Encounter (HOSPITAL_COMMUNITY): Payer: Medicare Other

## 2021-07-06 ENCOUNTER — Other Ambulatory Visit: Payer: Self-pay

## 2021-07-06 VITALS — BP 99/63 | HR 83 | Temp 99.1°F | Resp 18

## 2021-07-06 DIAGNOSIS — D649 Anemia, unspecified: Secondary | ICD-10-CM

## 2021-07-06 DIAGNOSIS — C7951 Secondary malignant neoplasm of bone: Secondary | ICD-10-CM | POA: Diagnosis not present

## 2021-07-06 DIAGNOSIS — C61 Malignant neoplasm of prostate: Secondary | ICD-10-CM

## 2021-07-06 DIAGNOSIS — Z95828 Presence of other vascular implants and grafts: Secondary | ICD-10-CM

## 2021-07-06 LAB — CBC WITH DIFFERENTIAL/PLATELET
Abs Immature Granulocytes: 0.45 10*3/uL — ABNORMAL HIGH (ref 0.00–0.07)
Basophils Absolute: 0 10*3/uL (ref 0.0–0.1)
Basophils Relative: 1 %
Eosinophils Absolute: 0 10*3/uL (ref 0.0–0.5)
Eosinophils Relative: 1 %
HCT: 24 % — ABNORMAL LOW (ref 39.0–52.0)
Hemoglobin: 8 g/dL — ABNORMAL LOW (ref 13.0–17.0)
Immature Granulocytes: 10 %
Lymphocytes Relative: 11 %
Lymphs Abs: 0.5 10*3/uL — ABNORMAL LOW (ref 0.7–4.0)
MCH: 31.7 pg (ref 26.0–34.0)
MCHC: 33.3 g/dL (ref 30.0–36.0)
MCV: 95.2 fL (ref 80.0–100.0)
Monocytes Absolute: 0.4 10*3/uL (ref 0.1–1.0)
Monocytes Relative: 9 %
Neutro Abs: 3.1 10*3/uL (ref 1.7–7.7)
Neutrophils Relative %: 68 %
Platelets: 47 10*3/uL — ABNORMAL LOW (ref 150–400)
RBC: 2.52 MIL/uL — ABNORMAL LOW (ref 4.22–5.81)
RDW: 16.8 % — ABNORMAL HIGH (ref 11.5–15.5)
WBC: 4.4 10*3/uL (ref 4.0–10.5)
nRBC: 2.7 % — ABNORMAL HIGH (ref 0.0–0.2)

## 2021-07-06 LAB — SAMPLE TO BLOOD BANK

## 2021-07-06 LAB — PREPARE RBC (CROSSMATCH)

## 2021-07-06 MED ORDER — HEPARIN SOD (PORK) LOCK FLUSH 100 UNIT/ML IV SOLN
500.0000 [IU] | Freq: Every day | INTRAVENOUS | Status: AC | PRN
  Administered 2021-07-06: 500 [IU]

## 2021-07-06 MED ORDER — SODIUM CHLORIDE 0.9% IV SOLUTION
250.0000 mL | Freq: Once | INTRAVENOUS | Status: AC
  Administered 2021-07-06: 250 mL via INTRAVENOUS

## 2021-07-06 MED ORDER — DIPHENHYDRAMINE HCL 25 MG PO CAPS
25.0000 mg | ORAL_CAPSULE | Freq: Once | ORAL | Status: AC
  Administered 2021-07-06: 25 mg via ORAL
  Filled 2021-07-06: qty 1

## 2021-07-06 MED ORDER — ACETAMINOPHEN 325 MG PO TABS
650.0000 mg | ORAL_TABLET | Freq: Once | ORAL | Status: AC
  Administered 2021-07-06: 650 mg via ORAL
  Filled 2021-07-06: qty 2

## 2021-07-06 MED ORDER — SODIUM CHLORIDE 0.9% FLUSH
10.0000 mL | INTRAVENOUS | Status: AC | PRN
  Administered 2021-07-06: 10 mL

## 2021-07-06 NOTE — Progress Notes (Signed)
Patient presents today for chemotherapy infusion and possible blood transfusion pending labs.  Patient is in satisfactory condition with no new complaints voiced.  Vital signs are stable.    Platelets today are 47.  Hemoglobin is 8.0.  We will hold Jevtana treatment today and transfuse one unit of PRBC per Dr. Delton Coombes.    Patient tolerated transfusion well with no complaints voiced.  Patient left via wheelchair in stable condition.  Vital signs stable at discharge.  Follow up as scheduled.

## 2021-07-06 NOTE — Patient Instructions (Signed)
[facility]  Discharge Instructions: Thank you for choosing [facility] to provide your oncology and hematology care.  If you have a lab appointment with the Cancer Center, please come in thru the Main Entrance and check in at the main information desk.  Wear comfortable clothing and clothing appropriate for easy access to any Portacath or PICC line.   We strive to give you quality time with your provider. You may need to reschedule your appointment if you arrive late (15 or more minutes).  Arriving late affects you and other patients whose appointments are after yours.  Also, if you miss three or more appointments without notifying the office, you may be dismissed from the clinic at the provider's discretion.      For prescription refill requests, have your pharmacy contact our office and allow 72 hours for refills to be completed.        To help prevent nausea and vomiting after your treatment, we encourage you to take your nausea medication as directed.  BELOW ARE SYMPTOMS THAT SHOULD BE REPORTED IMMEDIATELY: *FEVER GREATER THAN 100.4 F (38 C) OR HIGHER *CHILLS OR SWEATING *NAUSEA AND VOMITING THAT IS NOT CONTROLLED WITH YOUR NAUSEA MEDICATION *UNUSUAL SHORTNESS OF BREATH *UNUSUAL BRUISING OR BLEEDING *URINARY PROBLEMS (pain or burning when urinating, or frequent urination) *BOWEL PROBLEMS (unusual diarrhea, constipation, pain near the anus) TENDERNESS IN MOUTH AND THROAT WITH OR WITHOUT PRESENCE OF ULCERS (sore throat, sores in mouth, or a toothache) UNUSUAL RASH, SWELLING OR PAIN  UNUSUAL VAGINAL DISCHARGE OR ITCHING   Items with * indicate a potential emergency and should be followed up as soon as possible or go to the Emergency Department if any problems should occur.  Please show the CHEMOTHERAPY ALERT CARD or IMMUNOTHERAPY ALERT CARD at check-in to the Emergency Department and triage nurse.  Should you have questions after your visit or need to cancel  or reschedule your appointment, please contact [facility] [phone]  and follow the prompts.  Office hours are 8:00 a.m. to 4:30 p.m. Monday - Friday. Please note that voicemails left after 4:00 p.m. may not be returned until the following business day.  We are closed weekends and major holidays. You have access to a nurse at all times for urgent questions. Please call the main number to the clinic [phone] and follow the prompts.  For any non-urgent questions, you may also contact your provider using MyChart. We now offer e-Visits for anyone 18 and older to request care online for non-urgent symptoms. For details visit [email or web address].   Also download the MyChart app! Go to the app store, search "MyChart", open the app, select [facility], and log in with your MyChart username and password.  Due to Covid, a mask is required upon entering the hospital/clinic. If you do not have a mask, one will be given to you upon arrival. For doctor visits, patients may have 1 support person aged 18 or older with them. For treatment visits, patients cannot have anyone with them due to current Covid guidelines and our immunocompromised population.  

## 2021-07-07 LAB — TYPE AND SCREEN
ABO/RH(D): O POS
Antibody Screen: NEGATIVE
Unit division: 0

## 2021-07-07 LAB — BPAM RBC
Blood Product Expiration Date: 202302262359
ISSUE DATE / TIME: 202301231121
Unit Type and Rh: 5100

## 2021-07-08 ENCOUNTER — Ambulatory Visit (HOSPITAL_COMMUNITY): Payer: Medicare Other

## 2021-07-13 ENCOUNTER — Other Ambulatory Visit (HOSPITAL_COMMUNITY): Payer: Self-pay | Admitting: *Deleted

## 2021-07-13 ENCOUNTER — Encounter (HOSPITAL_COMMUNITY): Payer: Self-pay

## 2021-07-13 ENCOUNTER — Other Ambulatory Visit: Payer: Self-pay

## 2021-07-13 ENCOUNTER — Inpatient Hospital Stay (HOSPITAL_COMMUNITY): Payer: Medicare Other

## 2021-07-13 VITALS — BP 101/47 | HR 87 | Temp 97.5°F | Resp 20

## 2021-07-13 DIAGNOSIS — C7951 Secondary malignant neoplasm of bone: Secondary | ICD-10-CM | POA: Diagnosis not present

## 2021-07-13 DIAGNOSIS — D649 Anemia, unspecified: Secondary | ICD-10-CM

## 2021-07-13 DIAGNOSIS — C61 Malignant neoplasm of prostate: Secondary | ICD-10-CM

## 2021-07-13 DIAGNOSIS — Z95828 Presence of other vascular implants and grafts: Secondary | ICD-10-CM

## 2021-07-13 LAB — SAMPLE TO BLOOD BANK

## 2021-07-13 LAB — CBC WITH DIFFERENTIAL/PLATELET
Band Neutrophils: 2 %
Basophils Absolute: 0 10*3/uL (ref 0.0–0.1)
Basophils Relative: 0 %
Eosinophils Absolute: 0 10*3/uL (ref 0.0–0.5)
Eosinophils Relative: 0 %
HCT: 22.6 % — ABNORMAL LOW (ref 39.0–52.0)
Hemoglobin: 7.4 g/dL — ABNORMAL LOW (ref 13.0–17.0)
Lymphocytes Relative: 3 %
Lymphs Abs: 0.1 10*3/uL — ABNORMAL LOW (ref 0.7–4.0)
MCH: 30.7 pg (ref 26.0–34.0)
MCHC: 32.7 g/dL (ref 30.0–36.0)
MCV: 93.8 fL (ref 80.0–100.0)
Metamyelocytes Relative: 3 %
Monocytes Absolute: 0.1 10*3/uL (ref 0.1–1.0)
Monocytes Relative: 2 %
Neutro Abs: 4.2 10*3/uL (ref 1.7–7.7)
Neutrophils Relative %: 90 %
Platelets: 41 10*3/uL — ABNORMAL LOW (ref 150–400)
RBC: 2.41 MIL/uL — ABNORMAL LOW (ref 4.22–5.81)
RDW: 16.1 % — ABNORMAL HIGH (ref 11.5–15.5)
Smear Review: DECREASED
WBC: 4.6 10*3/uL (ref 4.0–10.5)
nRBC: 2 /100 WBC — ABNORMAL HIGH
nRBC: 2.9 % — ABNORMAL HIGH (ref 0.0–0.2)

## 2021-07-13 LAB — COMPREHENSIVE METABOLIC PANEL
ALT: 155 U/L — ABNORMAL HIGH (ref 0–44)
AST: 82 U/L — ABNORMAL HIGH (ref 15–41)
Albumin: 2.2 g/dL — ABNORMAL LOW (ref 3.5–5.0)
Alkaline Phosphatase: 294 U/L — ABNORMAL HIGH (ref 38–126)
Anion gap: 8 (ref 5–15)
BUN: 33 mg/dL — ABNORMAL HIGH (ref 8–23)
CO2: 18 mmol/L — ABNORMAL LOW (ref 22–32)
Calcium: 7.8 mg/dL — ABNORMAL LOW (ref 8.9–10.3)
Chloride: 105 mmol/L (ref 98–111)
Creatinine, Ser: 1.21 mg/dL (ref 0.61–1.24)
GFR, Estimated: >60 mL/min (ref >60)
Glucose, Bld: 248 mg/dL — ABNORMAL HIGH (ref 70–99)
Potassium: 4.5 mmol/L (ref 3.5–5.1)
Sodium: 131 mmol/L — ABNORMAL LOW (ref 135–145)
Total Bilirubin: 0.7 mg/dL (ref 0.3–1.2)
Total Protein: 5.8 g/dL — ABNORMAL LOW (ref 6.5–8.1)

## 2021-07-13 LAB — PREPARE RBC (CROSSMATCH)

## 2021-07-13 LAB — MAGNESIUM: Magnesium: 1.7 mg/dL (ref 1.7–2.4)

## 2021-07-13 MED ORDER — HEPARIN SOD (PORK) LOCK FLUSH 100 UNIT/ML IV SOLN
250.0000 [IU] | INTRAVENOUS | Status: AC | PRN
  Administered 2021-07-13: 250 [IU]

## 2021-07-13 MED ORDER — HYDROCODONE-ACETAMINOPHEN 5-325 MG PO TABS: 1.0000 | ORAL_TABLET | Freq: Three times a day (TID) | ORAL | 0 refills | Status: DC | PRN

## 2021-07-13 MED ORDER — ACETAMINOPHEN 325 MG PO TABS
650.0000 mg | ORAL_TABLET | Freq: Once | ORAL | Status: AC
  Administered 2021-07-13: 650 mg via ORAL
  Filled 2021-07-13: qty 2

## 2021-07-13 MED ORDER — SODIUM CHLORIDE 0.9% IV SOLUTION
250.0000 mL | Freq: Once | INTRAVENOUS | Status: AC
  Administered 2021-07-13: 250 mL via INTRAVENOUS

## 2021-07-13 MED ORDER — DIPHENHYDRAMINE HCL 25 MG PO CAPS
25.0000 mg | ORAL_CAPSULE | Freq: Once | ORAL | Status: AC
  Administered 2021-07-13: 25 mg via ORAL
  Filled 2021-07-13: qty 1

## 2021-07-13 MED ORDER — DENOSUMAB 120 MG/1.7ML ~~LOC~~ SOLN
120.0000 mg | Freq: Once | SUBCUTANEOUS | Status: AC
  Administered 2021-07-13: 120 mg via SUBCUTANEOUS
  Filled 2021-07-13: qty 1.7

## 2021-07-13 MED ORDER — SODIUM CHLORIDE 0.9% FLUSH
10.0000 mL | INTRAVENOUS | Status: AC | PRN
  Administered 2021-07-13: 10 mL

## 2021-07-13 NOTE — Progress Notes (Signed)
Patients port flushed without difficulty.  Good blood return noted with no bruising or swelling noted at site.  Stable during access and blood draw.  Patient to remain accessed for treatment. 

## 2021-07-13 NOTE — Patient Instructions (Signed)
[facility]  Discharge Instructions: Thank you for choosing [facility] to provide your oncology and hematology care.  If you have a lab appointment with the [facility], please come in thru the Main Entrance and check in at the main information desk.  Wear comfortable clothing and clothing appropriate for easy access to any Portacath or PICC line.   We strive to give you quality time with your provider. You may need to reschedule your appointment if you arrive late (15 or more minutes).  Arriving late affects you and other patients whose appointments are after yours.  Also, if you miss three or more appointments without notifying the office, you may be dismissed from the clinic at the providers discretion.      For prescription refill requests, have your pharmacy contact our office and allow 72 hours for refills to be completed.    Today you received the following Xgeva injection and 2 units of PRBCs, Norco was sent to your pharmacy, return as scheduled.   To help prevent nausea and vomiting after your treatment, we encourage you to take your nausea medication as directed.  BELOW ARE SYMPTOMS THAT SHOULD BE REPORTED IMMEDIATELY: *FEVER GREATER THAN 100.4 F (38 C) OR HIGHER *CHILLS OR SWEATING *NAUSEA AND VOMITING THAT IS NOT CONTROLLED WITH YOUR NAUSEA MEDICATION *UNUSUAL SHORTNESS OF BREATH *UNUSUAL BRUISING OR BLEEDING *URINARY PROBLEMS (pain or burning when urinating, or frequent urination) *BOWEL PROBLEMS (unusual diarrhea, constipation, pain near the anus) TENDERNESS IN MOUTH AND THROAT WITH OR WITHOUT PRESENCE OF ULCERS (sore throat, sores in mouth, or a toothache) UNUSUAL RASH, SWELLING OR PAIN  UNUSUAL VAGINAL DISCHARGE OR ITCHING   Items with * indicate a potential emergency and should be followed up as soon as possible or go to the Emergency Department if any problems should occur.  Please show the CHEMOTHERAPY ALERT CARD or IMMUNOTHERAPY ALERT  CARD at check-in to the Emergency Department and triage nurse.  Should you have questions after your visit or need to cancel or reschedule your appointment, please contact [facility] [phone]  and follow the prompts.  Office hours are 8:00 a.m. to 4:30 p.m. Monday - Friday. Please note that voicemails left after 4:00 p.m. may not be returned until the following business day.  We are closed weekends and major holidays. You have access to a nurse at all times for urgent questions. Please call the main number to the clinic [phone] and follow the prompts.  For any non-urgent questions, you may also contact your provider using MyChart. We now offer e-Visits for anyone 66 and older to request care online for non-urgent symptoms. For details visit [email or web address].   Also download the MyChart app! Go to the app store, search "MyChart", open the app, select [facility], and log in with your MyChart username and password.  Due to Covid, a mask is required upon entering the hospital/clinic. If you do not have a mask, one will be given to you upon arrival. For doctor visits, patients may have 1 support person aged 74 or older with them. For treatment visits, patients cannot have anyone with them due to current Covid guidelines and our immunocompromised population.

## 2021-07-13 NOTE — Progress Notes (Signed)
Patient presents today for possible blood/treatment. Hemoglobin 7.4, Calcium 7.8, Platelets 41, AST 82, ALT 155, Dr. Delton Coombes made aware of labs. Received verbal orders for 2 units of blood today, and patient is to receive Xgeva. Patient will follow up next week with Dr. Delton Coombes for possible treatment and Lupron injection.  Patient reports generalized pain not relieved by ibuprofen, and ask for stronger pain medication. Dr. Delton Coombes made aware and Rx will be sent to pharmacy. RN offered pain medication to patient while at clinic but patient denied and said he will wait.  Patient taking calcium as directed. Denied tooth, jaw, and leg pain. No recent or upcoming dental visits. Labs reviewed. Patient tolerated injection with no complaints voiced. See MAR for details. Patient stable during and after injection. Site clean and dry with no bruising or swelling noted. Band aid applied.  Patient tolerated 2 units of PRBCS with no complaints voiced. Side effects with management reviewed with understanding verbalized. Port site clean and dry with no bruising or swelling noted at site. Good blood return noted before and after administration of therapy. Band aid applied. Patient left in satisfactory condition with VSS and no s/s of distress noted.

## 2021-07-14 ENCOUNTER — Encounter (HOSPITAL_COMMUNITY)
Admission: RE | Admit: 2021-07-14 | Discharge: 2021-07-14 | Disposition: A | Discharge status: Home / Self Care | Payer: Medicare Other | Source: Ambulatory Visit | Attending: Hematology | Admitting: Hematology

## 2021-07-14 ENCOUNTER — Ambulatory Visit (HOSPITAL_COMMUNITY)
Admission: RE | Admit: 2021-07-14 | Discharge: 2021-07-14 | Disposition: A | Discharge status: Home / Self Care | Payer: Medicare Other | Source: Ambulatory Visit | Attending: Hematology | Admitting: Hematology

## 2021-07-14 ENCOUNTER — Encounter (HOSPITAL_COMMUNITY): Payer: Self-pay | Admitting: Hematology

## 2021-07-14 ENCOUNTER — Encounter (HOSPITAL_COMMUNITY): Payer: Self-pay

## 2021-07-14 ENCOUNTER — Encounter (HOSPITAL_COMMUNITY): Payer: Self-pay | Admitting: Emergency Medicine

## 2021-07-14 DIAGNOSIS — C61 Malignant neoplasm of prostate: Secondary | ICD-10-CM | POA: Diagnosis present

## 2021-07-14 DIAGNOSIS — C7951 Secondary malignant neoplasm of bone: Secondary | ICD-10-CM

## 2021-07-14 LAB — TYPE AND SCREEN
ABO/RH(D): O POS
Antibody Screen: NEGATIVE
Unit division: 0
Unit division: 0

## 2021-07-14 LAB — BPAM RBC
Blood Product Expiration Date: 202303082359
Blood Product Expiration Date: 202303082359
ISSUE DATE / TIME: 202301301118
ISSUE DATE / TIME: 202301301313
Unit Type and Rh: 5100
Unit Type and Rh: 5100

## 2021-07-14 MED ORDER — TECHNETIUM TC 99M MEDRONATE IV KIT
20.0000 | PACK | Freq: Once | INTRAVENOUS | Status: AC | PRN
  Administered 2021-07-14: 19.5 via INTRAVENOUS

## 2021-07-14 MED ORDER — IOHEXOL 300 MG/ML  SOLN
100.0000 mL | Freq: Once | INTRAMUSCULAR | Status: AC | PRN
  Administered 2021-07-14: 100 mL via INTRAVENOUS

## 2021-07-14 MED ORDER — IOHEXOL 9 MG/ML PO SOLN
ORAL | Status: AC
  Filled 2021-07-14: qty 1000

## 2021-07-14 NOTE — Progress Notes (Signed)
PA through [employer] started for hydrocodone-acetaminophen 5/325. Approved from 06/14/21-06/05/22.  Wife notified that prescription was approved.

## 2021-07-14 NOTE — Progress Notes (Signed)
Cardiology Office Note  Date: 07/15/2021   ID: [facility] Jones, DOB [birth year], MRN [id number]  PCP:  Monico Blitz, MD  Cardiologist:  Rozann Lesches, MD Electrophysiologist:  None   Chief Complaint  Patient presents with   Cardiac follow-up    History of Present Illness: [facility] Jones is a 66 y.o. male last seen in November 2022 by Mr. Leonides Sake NP.  He is here for a follow-up visit.  He does not describe any obvious angina symptoms at this time.  Has been fatigued and short of breath, currently undergoing treatment for metastatic prostate cancer and with associated symptomatic anemia and thrombocytopenia.  He has required packed red cell transfusions.  I reviewed his recent lab work as outlined below.  From a cardiac perspective he is on low-dose aspirin and Toprol-XL, continues on Lipitor as well.  He did have to suspend cardiac rehabilitation.  Past Medical History:  Diagnosis Date   Acute ST elevation myocardial infarction (STEMI) of inferior wall ([facility]) 11/2019   Asthma    COPD (chronic obstructive pulmonary disease) (HCC)    Coronary artery disease    DES to mid LAD August 2014 ([facility]); overlapping DESx2 prox-mid RCA 11/2019   Essential hypertension    Hyperthyroidism    NSTEMI (non-ST elevated myocardial infarction) ([facility]) 2014   Port-A-Cath in place 06/17/2020   Prostate cancer ([facility])    Type 2 diabetes mellitus ([facility])     Past Surgical History:  Procedure Laterality Date   APPENDECTOMY     CORONARY/GRAFT ACUTE MI REVASCULARIZATION N/A 12/10/2019   Procedure: Coronary/Graft Acute MI Revascularization;  Surgeon: Nelva Bush, MD;  Location: [facility];  Service: Cardiovascular;  Laterality: N/A;   IR IMAGING GUIDED PORT INSERTION  06/19/2020   LEFT HEART CATH AND CORONARY ANGIOGRAPHY N/A 12/10/2019   Procedure: LEFT HEART CATH AND CORONARY ANGIOGRAPHY;  Surgeon: Nelva Bush, MD;  Location: [facility];  Service: Cardiovascular;  Laterality: N/A;    LYMPHADENECTOMY Bilateral 06/27/2014   Procedure: LYMPHADENECTOMY;  Surgeon: Raynelle Bring, MD;  Location: [facility];  Service: Urology;  Laterality: Bilateral;   ROBOT ASSISTED LAPAROSCOPIC RADICAL PROSTATECTOMY N/A 06/27/2014   Procedure: ROBOTIC ASSISTED LAPAROSCOPIC RADICAL PROSTATECTOMY LEVEL 3;  Surgeon: Raynelle Bring, MD;  Location: [facility];  Service: Urology;  Laterality: N/A;   TONSILLECTOMY      Current Outpatient Medications  Medication Sig Dispense Refill   ADVAIR DISKUS 250-50 MCG/ACT AEPB Inhale 1 puff into the lungs 2 (two) times daily.     albuterol (PROVENTIL HFA;VENTOLIN HFA) 108 (90 BASE) MCG/ACT inhaler Inhale 1-2 puffs into the lungs every 6 (six) hours as needed for wheezing or shortness of breath.     Ascorbic Acid (VITAMIN C PO) Take 1 tablet by mouth daily.     aspirin EC 81 MG tablet Take 81 mg by mouth 3 (three) times a week.     atorvastatin (LIPITOR) 40 MG tablet TAKE 1 TABLET BY MOUTH AT BEDTIME. (Patient taking differently: Take 40 mg by mouth at bedtime.) 30 tablet 6   BREO ELLIPTA 200-25 MCG/ACT AEPB Inhale 1 puff into the lungs daily.     Calcium Carb-Cholecalciferol (HM CALCIUM-VITAMIN D) 600-800 MG-UNIT TABS Take 1 tablet by mouth 2 (two) times daily. 1800 mg per day     Coenzyme Q10 (COQ10) 200 MG CAPS Take 200 mg by mouth daily.     denosumab (XGEVA) 120 MG/1.7ML SOLN injection Inject 120 mg into the skin every 30 (thirty) days. Last received  11/07/20     fluticasone-salmeterol (ADVAIR HFA) 115-21 MCG/ACT inhaler Inhale 2 puffs into the lungs 2 (two) times daily.      HYDROcodone-acetaminophen (NORCO) 5-325 MG tablet Take 1 tablet by mouth every 8 (eight) hours as needed for moderate pain. 90 tablet 0   Ibuprofen-diphenhydrAMINE HCl (IBUPROFEN PM) 200-25 MG CAPS Take 1 tablet by mouth at bedtime as needed (sleep).     KRILL OIL PO Take 1 capsule by mouth daily.     Leuprolide Acetate, 3 Month, (LUPRON DEPOT, 37-MONTH, IM) Inject 1 Dose into the muscle every 3  (three) months.     lidocaine-prilocaine (EMLA) cream Apply to affected area once 30 g 3   Lutein 20 MG CAPS Take 20 mg by mouth daily.     methimazole (TAPAZOLE) 10 MG tablet Take 10 mg by mouth every other day.      methocarbamol (ROBAXIN) 750 MG tablet Take 750 mg by mouth at bedtime as needed.     metoprolol succinate (TOPROL-XL) 25 MG 24 hr tablet Take 1 tablet (25 mg total) by mouth every other day. 45 tablet 3   Multiple Vitamin (MULTIVITAMIN) tablet Take 1 tablet by mouth daily.     nitroGLYCERIN (NITROSTAT) 0.4 MG SL tablet Place 1 tablet (0.4 mg total) under the tongue every 5 (five) minutes as needed. 25 tablet 12   Potassium 99 MG TABS Take 99 mg by mouth daily.     predniSONE (DELTASONE) 10 MG tablet Take 1 tablet (10 mg total) by mouth daily. 21 tablet 3   prochlorperazine (COMPAZINE) 10 MG tablet Take 1 tablet (10 mg total) by mouth every 6 (six) hours as needed (Nausea or vomiting). 30 tablet 1   Trolamine Salicylate (ASPERCREME EX) Apply 1 application topically daily as needed (pain).     No current facility-administered medications for this visit.   Allergies:  Xtandi [enzalutamide] and Penicillins   ROS: No palpitations or syncope.  Physical Exam: VS:  BP 108/64    Pulse 96    Ht 5\' 10"  (1.778 m)    Wt 227 lb 6.4 oz (103.1 kg)    SpO2 99%    BMI 32.63 kg/m , BMI Body mass index is 32.63 kg/m.  Wt Readings from Last 3 Encounters:  07/15/21 227 lb 6.4 oz (103.1 kg)  07/13/21 229 lb 11.2 oz (104.2 kg)  07/06/21 229 lb 9.6 oz (104.1 kg)    General: Patient appears comfortable at rest. HEENT: Conjunctiva and lids normal, wearing a mask. Neck: Supple, no elevated JVP or carotid bruits, no thyromegaly. Lungs: Clear to auscultation, nonlabored breathing at rest. Cardiac: Regular rate and rhythm, no S3, 1/6 systolic murmur, no pericardial rub. Extremities: No pitting edema.  ECG:  An ECG dated 02/04/2021 was personally reviewed today and demonstrated:  Sinus  rhythm.  Recent Labwork: 06/22/2021: TSH 0.859 07/13/2021: ALT 155; AST 82; BUN 33; Creatinine, Ser 1.21; Hemoglobin 7.4; Magnesium 1.7; Platelets 41; Potassium 4.5; Sodium 131     Component Value Date/Time   CHOL 68 12/10/2019 2351   TRIG 91 12/10/2019 2351   HDL 28 (L) 12/10/2019 2351   CHOLHDL 2.4 12/10/2019 2351   VLDL 18 12/10/2019 2351   LDLCALC 22 12/10/2019 2351    Other Studies Reviewed Today:  Lexiscan Myoview 09/08/2020: No diagnostic ST segment changes to indicate ischemia. Moderate intensity, apical to basal inferolateral defect that is partially reversible suggesting scar with mild peri-infarct ischemia. There is also radiotracer uptake within the gut adjacent to the inferior wall  on rest imaging which could be causing artifactual reversibility in this area. This is an intermediate risk study, based on calculated LVEF. Suggest echocardiogram to clarify if not performed recently. Nuclear stress EF: 39%.  Echocardiogram 09/10/2020:  1. Left ventricular ejection fraction, by estimation, is 50 to 55%. The  left ventricle has low normal function. The left ventricle demonstrates  regional wall motion abnormalities (see scoring diagram/findings for  description). The left ventricular  internal cavity size was mildly dilated. There is mild left ventricular  hypertrophy. Left ventricular diastolic parameters were normal.   2. Right ventricular systolic function is normal. The right ventricular  size is normal. Tricuspid regurgitation signal is inadequate for assessing  PA pressure.   3. Left atrial size was mildly dilated.   4. The mitral valve is grossly normal. Trivial mitral valve  regurgitation.   5. The aortic valve is tricuspid. Aortic valve regurgitation is mild.  Mild aortic valve sclerosis is present, with no evidence of aortic valve  stenosis.   6. The inferior vena cava is normal in size with greater than 50%  respiratory variability, suggesting right atrial  pressure of 3 mmHg.   Assessment and Plan:  1.  CAD status post DES to the LAD in 2014 with overlapping DES x2 to the proximal and mid RCA in 2021.  He does not describe any active angina at this time with medical regimen including aspirin, Toprol-XL, and Lipitor.  Follow-up Myoview in March 2022 showed region of inferolateral scar with mild peri-infarct ischemia, LVEF 50 to 55% by echocardiogram.  Continue observation for now.  2.  Mixed hyperlipidemia, continues on Lipitor.  3.  Metastatic prostate cancer undergoing treatment and follow-up by Oncology.  Medication Adjustments/Labs and Tests Ordered: Current medicines are reviewed at length with the patient today.  Concerns regarding medicines are outlined above.   Tests Ordered: No orders of the defined types were placed in this encounter.   Medication Changes: No orders of the defined types were placed in this encounter.   Disposition:  Follow up  6 months.  Signed, Satira Sark, MD, [facility] 07/15/2021 11:46 AM    [facility] [address], [place], [place] [address] Phone: [phone]; Fax: [phone]

## 2021-07-15 ENCOUNTER — Encounter: Payer: Self-pay | Admitting: Cardiology

## 2021-07-15 ENCOUNTER — Ambulatory Visit (INDEPENDENT_AMBULATORY_CARE_PROVIDER_SITE_OTHER): Payer: Medicare Other | Admitting: Cardiology

## 2021-07-15 VITALS — BP 108/64 | HR 96 | Ht 70.0 in | Wt 227.4 lb

## 2021-07-15 DIAGNOSIS — I25119 Atherosclerotic heart disease of native coronary artery with unspecified angina pectoris: Secondary | ICD-10-CM | POA: Diagnosis not present

## 2021-07-15 DIAGNOSIS — E782 Mixed hyperlipidemia: Secondary | ICD-10-CM | POA: Diagnosis not present

## 2021-07-15 NOTE — Patient Instructions (Addendum)

## 2021-07-20 ENCOUNTER — Other Ambulatory Visit (HOSPITAL_COMMUNITY): Payer: Self-pay | Admitting: Hematology

## 2021-07-20 ENCOUNTER — Other Ambulatory Visit: Payer: Self-pay

## 2021-07-20 ENCOUNTER — Inpatient Hospital Stay (HOSPITAL_COMMUNITY): Payer: Medicare Other | Attending: Hematology

## 2021-07-20 ENCOUNTER — Other Ambulatory Visit (HOSPITAL_COMMUNITY): Payer: Self-pay | Admitting: *Deleted

## 2021-07-20 ENCOUNTER — Ambulatory Visit (HOSPITAL_COMMUNITY): Payer: Medicare Other | Admitting: Hematology

## 2021-07-20 ENCOUNTER — Ambulatory Visit (HOSPITAL_COMMUNITY): Payer: Medicare Other

## 2021-07-20 ENCOUNTER — Inpatient Hospital Stay (HOSPITAL_COMMUNITY): Payer: Medicare Other

## 2021-07-20 ENCOUNTER — Inpatient Hospital Stay (HOSPITAL_BASED_OUTPATIENT_CLINIC_OR_DEPARTMENT_OTHER): Payer: Medicare Other | Admitting: Hematology

## 2021-07-20 ENCOUNTER — Other Ambulatory Visit (HOSPITAL_COMMUNITY): Payer: Medicare Other

## 2021-07-20 VITALS — BP 84/56 | HR 110 | Temp 97.7°F | Resp 20 | Ht 70.0 in | Wt 224.9 lb

## 2021-07-20 VITALS — BP 101/61 | HR 97 | Temp 98.0°F | Resp 20

## 2021-07-20 DIAGNOSIS — Z5111 Encounter for antineoplastic chemotherapy: Secondary | ICD-10-CM | POA: Insufficient documentation

## 2021-07-20 DIAGNOSIS — T451X5A Adverse effect of antineoplastic and immunosuppressive drugs, initial encounter: Secondary | ICD-10-CM

## 2021-07-20 DIAGNOSIS — D696 Thrombocytopenia, unspecified: Secondary | ICD-10-CM | POA: Insufficient documentation

## 2021-07-20 DIAGNOSIS — Z79899 Other long term (current) drug therapy: Secondary | ICD-10-CM | POA: Diagnosis not present

## 2021-07-20 DIAGNOSIS — C61 Malignant neoplasm of prostate: Secondary | ICD-10-CM | POA: Diagnosis present

## 2021-07-20 DIAGNOSIS — M79662 Pain in left lower leg: Secondary | ICD-10-CM

## 2021-07-20 DIAGNOSIS — D61810 Antineoplastic chemotherapy induced pancytopenia: Secondary | ICD-10-CM

## 2021-07-20 DIAGNOSIS — M7989 Other specified soft tissue disorders: Secondary | ICD-10-CM

## 2021-07-20 DIAGNOSIS — C7951 Secondary malignant neoplasm of bone: Secondary | ICD-10-CM | POA: Diagnosis not present

## 2021-07-20 DIAGNOSIS — D649 Anemia, unspecified: Secondary | ICD-10-CM

## 2021-07-20 DIAGNOSIS — Z95828 Presence of other vascular implants and grafts: Secondary | ICD-10-CM

## 2021-07-20 LAB — CBC WITH DIFFERENTIAL/PLATELET
Abs Immature Granulocytes: 0.34 10*3/uL — ABNORMAL HIGH (ref 0.00–0.07)
Basophils Absolute: 0 10*3/uL (ref 0.0–0.1)
Basophils Relative: 0 %
Eosinophils Absolute: 0 10*3/uL (ref 0.0–0.5)
Eosinophils Relative: 0 %
HCT: 27.3 % — ABNORMAL LOW (ref 39.0–52.0)
Hemoglobin: 9 g/dL — ABNORMAL LOW (ref 13.0–17.0)
Immature Granulocytes: 7 %
Lymphocytes Relative: 10 %
Lymphs Abs: 0.5 10*3/uL — ABNORMAL LOW (ref 0.7–4.0)
MCH: 30.7 pg (ref 26.0–34.0)
MCHC: 33 g/dL (ref 30.0–36.0)
MCV: 93.2 fL (ref 80.0–100.0)
Monocytes Absolute: 0.3 10*3/uL (ref 0.1–1.0)
Monocytes Relative: 7 %
Neutro Abs: 3.5 10*3/uL (ref 1.7–7.7)
Neutrophils Relative %: 76 %
Platelets: 37 10*3/uL — ABNORMAL LOW (ref 150–400)
RBC: 2.93 MIL/uL — ABNORMAL LOW (ref 4.22–5.81)
RDW: 15.9 % — ABNORMAL HIGH (ref 11.5–15.5)
WBC: 4.7 10*3/uL (ref 4.0–10.5)
nRBC: 2.3 % — ABNORMAL HIGH (ref 0.0–0.2)

## 2021-07-20 LAB — COMPREHENSIVE METABOLIC PANEL
ALT: 150 U/L — ABNORMAL HIGH (ref 0–44)
AST: 81 U/L — ABNORMAL HIGH (ref 15–41)
Albumin: 2.4 g/dL — ABNORMAL LOW (ref 3.5–5.0)
Alkaline Phosphatase: 300 U/L — ABNORMAL HIGH (ref 38–126)
Anion gap: 10 (ref 5–15)
BUN: 32 mg/dL — ABNORMAL HIGH (ref 8–23)
CO2: 18 mmol/L — ABNORMAL LOW (ref 22–32)
Calcium: 7.8 mg/dL — ABNORMAL LOW (ref 8.9–10.3)
Chloride: 104 mmol/L (ref 98–111)
Creatinine, Ser: 1.28 mg/dL — ABNORMAL HIGH (ref 0.61–1.24)
GFR, Estimated: >60 mL/min (ref >60)
Glucose, Bld: 289 mg/dL — ABNORMAL HIGH (ref 70–99)
Potassium: 4.3 mmol/L (ref 3.5–5.1)
Sodium: 132 mmol/L — ABNORMAL LOW (ref 135–145)
Total Bilirubin: 1 mg/dL (ref 0.3–1.2)
Total Protein: 6 g/dL — ABNORMAL LOW (ref 6.5–8.1)

## 2021-07-20 LAB — PREPARE RBC (CROSSMATCH)

## 2021-07-20 LAB — SAMPLE TO BLOOD BANK

## 2021-07-20 LAB — MAGNESIUM: Magnesium: 1.6 mg/dL — ABNORMAL LOW (ref 1.7–2.4)

## 2021-07-20 MED ORDER — ESCITALOPRAM OXALATE 10 MG PO TABS: 10.0000 mg | ORAL_TABLET | Freq: Every day | ORAL | 2 refills | Status: DC

## 2021-07-20 MED ORDER — HEPARIN SOD (PORK) LOCK FLUSH 100 UNIT/ML IV SOLN
500.0000 [IU] | Freq: Every day | INTRAVENOUS | Status: AC | PRN
  Administered 2021-07-20: 500 [IU]

## 2021-07-20 MED ORDER — HEPARIN SOD (PORK) LOCK FLUSH 100 UNIT/ML IV SOLN: 500.0000 [IU] | Freq: Every day | INTRAVENOUS | Status: DC | PRN

## 2021-07-20 MED ORDER — ALPRAZOLAM 0.5 MG PO TABS: 0.5000 mg | ORAL_TABLET | Freq: Three times a day (TID) | ORAL | 0 refills | Status: AC | PRN

## 2021-07-20 MED ORDER — LEUPROLIDE ACETATE (6 MONTH) 45 MG ~~LOC~~ KIT
45.0000 mg | PACK | Freq: Once | SUBCUTANEOUS | Status: AC
  Administered 2021-07-20: 45 mg via SUBCUTANEOUS
  Filled 2021-07-20: qty 45

## 2021-07-20 MED ORDER — ACETAMINOPHEN 325 MG PO TABS
650.0000 mg | ORAL_TABLET | Freq: Once | ORAL | Status: AC
  Administered 2021-07-20: 650 mg via ORAL
  Filled 2021-07-20: qty 2

## 2021-07-20 MED ORDER — OXYCODONE HCL 10 MG PO TABS: 10.0000 mg | ORAL_TABLET | Freq: Three times a day (TID) | ORAL | 0 refills | Status: AC

## 2021-07-20 MED ORDER — SODIUM CHLORIDE 0.9% FLUSH
10.0000 mL | INTRAVENOUS | Status: AC | PRN
  Administered 2021-07-20: 10 mL

## 2021-07-20 MED ORDER — SODIUM CHLORIDE 0.9% IV SOLUTION
250.0000 mL | Freq: Once | INTRAVENOUS | Status: AC
  Administered 2021-07-20: 250 mL via INTRAVENOUS

## 2021-07-20 MED ORDER — CITALOPRAM HYDROBROMIDE 20 MG PO TABS: 20.0000 mg | ORAL_TABLET | Freq: Every day | ORAL | 3 refills | Status: DC

## 2021-07-20 MED ORDER — DIPHENHYDRAMINE HCL 25 MG PO CAPS
25.0000 mg | ORAL_CAPSULE | Freq: Once | ORAL | Status: AC
  Administered 2021-07-20: 25 mg via ORAL
  Filled 2021-07-20: qty 1

## 2021-07-20 NOTE — Progress Notes (Signed)
Patient presents today for treatment, possible blood products and follow up visit with Dr. Delton Coombes. Blood pressure on arrival 84/56. Pulse 110. Vital signs and labs reviewed by Dr. Delton Coombes.   Message received from A. Ouida Sills RN / Dr.Katragadda NO treatment today [facility] ) . Infuse one unit of blood and give Eligard injection today. Weekly lab checks with possible blood infusion per A. Anderson RN/ Dr. Delton Coombes.   1 unit of blood given today per MD orders. Tolerated infusion without adverse affects. Vital signs stable. No complaints at this time. Discharged from clinic by wheel chair in stable condition. Alert and oriented x 3. F/U with [facility] as scheduled.

## 2021-07-20 NOTE — Patient Instructions (Signed)
Hunter Jones at [facility] Discharge Instructions   You were seen and examined today by Dr. Delton Coombes.  He reviewed the results of your CT and bone scans.  These have shown progression of your cancer.  We will arrange for you to have a bone marrow biopsy to see why your blood counts are remaining so low.   We will also schedule you to have an ultrasound of your left leg to rule out a blood clot, since you have new onset of pain and swelling in this area.   We have sent a prescription for oxycodone to your pharmacy to see if this will better control your pain.  You can also use ibuprofen with this medication to help control your pain.  We have sent a prescription to your pharmacy for Lexapro.  This is to help with your anxiety and depression.  We will give you one unit of blood today to see if this helps with your symptoms.   Return as scheduled for lab work and office visit.    Thank you for choosing [place] at [facility] to provide your oncology and hematology care.  To afford each patient quality time with our provider, please arrive at least 15 minutes before your scheduled appointment time.   If you have a lab appointment with the [place] please come in thru the Main Entrance and check in at the main information desk.  You need to re-schedule your appointment should you arrive 10 or more minutes late.  We strive to give you quality time with our providers, and arriving late affects you and other patients whose appointments are after yours.  Also, if you no show three or more times for appointments you may be dismissed from the clinic at the providers discretion.     Again, thank you for choosing [facility].  Our hope is that these requests will decrease the amount of time that you wait before being seen by our physicians.       _____________________________________________________________  Should you have questions  after your visit to [facility], please contact our office at [phone] and follow the prompts.  Our office hours are 8:00 a.m. and 4:30 p.m. Monday - Friday.  Please note that voicemails left after 4:00 p.m. may not be returned until the following business day.  We are closed weekends and major holidays.  You do have access to a nurse 24-7, just call the main number to the clinic [phone] and do not press any options, hold on the line and a nurse will answer the phone.    For prescription refill requests, have your pharmacy contact our office and allow 72 hours.    Due to Covid, you will need to wear a mask upon entering the hospital. If you do not have a mask, a mask will be given to you at the Main Entrance upon arrival. For doctor visits, patients may have 1 support person age 74 or older with them. For treatment visits, patients can not have anyone with them due to social distancing guidelines and our immunocompromised population.

## 2021-07-20 NOTE — Progress Notes (Signed)
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 Radical resection of prostate on 06/27/2014. 2. Zytiga from 05/19/2018 to 11/09/2019. 3. Xtandi from 11/21/2019 to 12/05/2019, stopped due to mood changes  NGS Results: Guardant AR amplification, MSI normal, TMB 16 Muts/Mb  CURRENT THERAPY: Docetaxel every 3 weeks; Xgeva monthly; Lupron every 3 months  BRIEF ONCOLOGIC HISTORY:  Oncology History  Prostate cancer (Hunter Jones)  02/06/2014 Procedure   Prostate biopsy by Dr. 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  Total Gleason score equals 7.  Proportion of prostatic tissue involved by tumor: 70%.   05/03/2014 Imaging   Bone scan- Negative     06/27/2014 Procedure   Radical resection of prostate by Dr. Raynelle Jones   07/29/2014 Pathology Results   1. Prostate, radical resection - PROSTATIC ADENOCARCINOMA, GLEASON SCORE 4 + 3 = 7. - RIGHT AND LEFT PROSTATE INVOLVED. - RIGHT AND LEFT SEMINAL VESICLES INVOLVED BY TUMOR. - EXTRACAPSULAR EXTENSION BY TUMOR. - TUMOR EXTENDS INTO BLADDER NECK TISSUE. - MARGINS NOT INVOLVED. 2. Lymph nodes, regional resection, left pelvic - THREE BENIGN LYMPH NODES (0/3). 3. Lymph nodes, regional resection, right pelvic - FOUR BENIGN LYMPH NODES (0/4).   11/16/2016 Imaging   Bone scan- New areas of increased uptake superolateral to the left orbit (faint), at S1, and in the posterolateral aspect of the left sixth rib.    11/25/2016 Imaging   CT CAP- 1. Status post radical prostatectomy. No findings to suggest local recurrence of disease or definite extraskeletal metastatic disease in the  chest, abdomen or pelvis. However, there are several osseous lesions, as above, concerning for metastatic disease to the bones. 2. Hepatic steatosis. 3. Aortic atherosclerosis, in addition to 2 vessel coronary artery disease. Please note that although the presence of coronary artery calcium documents the presence of coronary artery disease, the severity of this disease and any potential stenosis cannot be assessed on this non-gated CT examination. Assessment for potential risk factor modification, dietary therapy or pharmacologic therapy may be warranted, if clinically indicated. 4. Additional incidental findings, as above.    Genetic Testing   Guardant 360 Results:         06/23/2020 - 10/08/2020 Chemotherapy   Patient is on Treatment Plan : PROSTATE Docetaxel + Prednisone q21d     04/30/2021 -  Chemotherapy   Patient is on Treatment Plan : PROSTATE Cabazitaxel + Prednisone q21d       CANCER STAGING:  Cancer Staging  Prostate cancer [facility]) Staging form: Prostate, AJCC 7th Edition - Pathologic stage from 07/29/2014: Stage III (T3b, N0, cM0, Gleason 7) - Signed by Baird Cancer, PA-C on 11/09/2016 - Clinical: Stage IV (T3, N0, M1, PSA: Less than 10, Gleason 7) - Signed by [name], MD on 12/21/2016 - Pathologic: No stage assigned - Unsigned   INTERVAL HISTORY:  [name], a 66 y.o. 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 Traye was last seen on 06/22/2021.   Today he reports feeling poorly, and he is accompanied by his wife. He reports SOB and poor sleep. He reports a lump and sharp pain in his  left leg. He reports pain intermittently in his right leg and his shoulders bilaterally. He is currently taking 81 mg aspirin daily. He reports green stools for the past month. His wife reports his anxiety and depression have increased. His activity levels have decreased due to the increased SOB and fatigue. He has not taken hydrocodone  as he reports it does not not provide pain relief, and he prefers ibuprofen. He reports frequent and worsening chills over the past weekend. His cough is stable, and he denies fevers, and dysuria. He report urinary frequency and nocturia. He reports dizziness upon standing.   REVIEW OF SYSTEMS:  Review of Systems  Constitutional:  Positive for appetite change, chills and fatigue. Negative for fever.  HENT:   Positive for lump/mass (R shin).   Respiratory:  Positive for cough and shortness of breath.   Gastrointestinal:  Positive for constipation and nausea.  Genitourinary:  Positive for frequency and nocturia. Negative for dysuria.   Musculoskeletal:  Positive for arthralgias (7/10 legs, shoulders, and hips), back pain (7/10) and myalgias (7/10 buttock).  Neurological:  Positive for dizziness, headaches and numbness.  Psychiatric/Behavioral:  Positive for depression and sleep disturbance. The patient is nervous/anxious.   All other systems reviewed and are negative.  PAST MEDICAL/SURGICAL HISTORY:  Past Medical History:  Diagnosis Date   Acute ST elevation myocardial infarction (STEMI) of inferior wall ([place]) 11/2019   Asthma    COPD (chronic obstructive pulmonary disease) (H[facility])    Coronary artery disease    DES to mid LAD August 2014 ([facility]); overlapping DESx2 prox-mid RCA 11/2019   Essential hypertension    Hyperthyroidism    NSTEMI (non-ST elevated myocardial infarction) ([facility]) 2014   Port-A-Cath in place 06/17/2020   Prostate cancer ([place])    Type 2 diabetes mellitus ([place])    Past Surgical History:  Procedure Laterality Date   APPENDECTOMY     CORONARY/GRAFT ACUTE MI REVASCULARIZATION N/A 12/10/2019   Procedure: Coronary/Graft Acute MI Revascularization;  Surgeon: Nelva Bush, MD;  Location: [facility];  Service: Cardiovascular;  Laterality: N/A;   IR IMAGING GUIDED PORT INSERTION  06/19/2020   LEFT HEART CATH AND CORONARY ANGIOGRAPHY N/A 12/10/2019   Procedure: LEFT HEART  CATH AND CORONARY ANGIOGRAPHY;  Surgeon: Nelva Bush, MD;  Location: [facility];  Service: Cardiovascular;  Laterality: N/A;   LYMPHADENECTOMY Bilateral 06/27/2014   Procedure: LYMPHADENECTOMY;  Surgeon: Hunter Bring, MD;  Location: [facility];  Service: Urology;  Laterality: Bilateral;   ROBOT ASSISTED LAPAROSCOPIC RADICAL PROSTATECTOMY N/A 06/27/2014   Procedure: ROBOTIC ASSISTED LAPAROSCOPIC RADICAL PROSTATECTOMY LEVEL 3;  Surgeon: [place] Bring, MD;  Location: [facility];  Service: Urology;  Laterality: N/A;   TONSILLECTOMY      SOCIAL HISTORY:  Social History   Socioeconomic History   Marital status: Married    Spouse name: Not on file   Number of children: 0   Years of education: Not on file   Highest education level: Not on file  Occupational History   Occupation: truck driver    Comment: retired  Tobacco Use   Smoking status: Former    Packs/day: 1.00    Years: 40.00    Pack years: 40.00    Types: Cigarettes    Quit date: 06/21/2012    Years since quitting: 9.0   Smokeless tobacco: Never  Vaping Use   Vaping Use: Never used  Substance and Sexual Activity   Alcohol use: Yes    Comment: occasional   Drug use: No  Sexual activity: Not Currently  Other Topics Concern   Not on file  Social History Narrative   Not on file   Social Determinants of Health   Financial Resource Strain: Not on file  Food Insecurity: Not on file  Transportation Needs: Not on file  Physical Activity: Not on file  Stress: Not on file  Social Connections: Not on file  Intimate Partner Violence: Not on file    FAMILY HISTORY:  Family History  Problem Relation Age of Onset   Dementia Mother    Thyroid disease Mother    Clotting disorder Mother    Kidney Stones Father    Stroke Brother    Alzheimer's disease Maternal Uncle    Tuberculosis Paternal Grandmother    Heart attack Maternal Uncle        x4   Heart attack Cousin        in his 19s   Cancer Neg Hx     CURRENT  MEDICATIONS:  Current Outpatient Medications  Medication Sig Dispense Refill   ADVAIR DISKUS 250-50 MCG/ACT AEPB Inhale 1 puff into the lungs 2 (two) times daily.     albuterol (PROVENTIL HFA;VENTOLIN HFA) 108 (90 BASE) MCG/ACT inhaler Inhale 1-2 puffs into the lungs every 6 (six) hours as needed for wheezing or shortness of breath.     Ascorbic Acid (VITAMIN C PO) Take 1 tablet by mouth daily.     aspirin EC 81 MG tablet Take 81 mg by mouth 3 (three) times a week.     atorvastatin (LIPITOR) 40 MG tablet TAKE 1 TABLET BY MOUTH AT BEDTIME. (Patient taking differently: Take 40 mg by mouth at bedtime.) 30 tablet 6   BREO ELLIPTA 200-25 MCG/ACT AEPB Inhale 1 puff into the lungs daily.     Calcium Carb-Cholecalciferol (HM CALCIUM-VITAMIN D) 600-800 MG-UNIT TABS Take 1 tablet by mouth 2 (two) times daily. 1800 mg per day     Coenzyme Q10 (COQ10) 200 MG CAPS Take 200 mg by mouth daily.     denosumab (XGEVA) 120 MG/1.7ML SOLN injection Inject 120 mg into the skin every 30 (thirty) days. Last received 11/07/20     escitalopram (LEXAPRO) 10 MG tablet Take 1 tablet (10 mg total) by mouth daily. Take 1 tablet daily for one week, then increase to two tablets daily 60 tablet 2   fluticasone-salmeterol (ADVAIR HFA) 115-21 MCG/ACT inhaler Inhale 2 puffs into the lungs 2 (two) times daily.      Ibuprofen-diphenhydrAMINE HCl (IBUPROFEN PM) 200-25 MG CAPS Take 1 tablet by mouth at bedtime as needed (sleep).     KRILL OIL PO Take 1 capsule by mouth daily.     Leuprolide Acetate, 3 Month, (LUPRON DEPOT, 75-MONTH, IM) Inject 1 Dose into the muscle every 3 (three) months.     lidocaine-prilocaine (EMLA) cream Apply to affected area once 30 g 3   Lutein 20 MG CAPS Take 20 mg by mouth daily.     methimazole (TAPAZOLE) 10 MG tablet Take 10 mg by mouth every other day.      methocarbamol (ROBAXIN) 750 MG tablet Take 750 mg by mouth at bedtime as needed.     metoprolol succinate (TOPROL-XL) 25 MG 24 hr tablet Take 1 tablet  (25 mg total) by mouth every other day. 45 tablet 3   Multiple Vitamin (MULTIVITAMIN) tablet Take 1 tablet by mouth daily.     nitroGLYCERIN (NITROSTAT) 0.4 MG SL tablet Place 1 tablet (0.4 mg total) under the tongue every 5 (five) minutes  as needed. 25 tablet 12   Potassium 99 MG TABS Take 99 mg by mouth daily.     predniSONE (DELTASONE) 10 MG tablet Take 1 tablet (10 mg total) by mouth daily. 21 tablet 3   prochlorperazine (COMPAZINE) 10 MG tablet Take 1 tablet (10 mg total) by mouth every 6 (six) hours as needed (Nausea or vomiting). 30 tablet 1   Trolamine Salicylate (ASPERCREME EX) Apply 1 application topically daily as needed (pain).     No current facility-administered medications for this visit.    ALLERGIES:  Allergies  Allergen Reactions   Xtandi [Enzalutamide] Other (See Comments)    "Severe personality change" and blood clots   Penicillins Hives    PHYSICAL EXAM:  Performance status (ECOG): 1 - Symptomatic but completely ambulatory  Vitals:   07/20/21 0857  BP: (!) 84/56  Pulse: (!) 110  Resp: 20  Temp: 97.7 F (36.5 C)  SpO2: 100%   Wt Readings from Last 3 Encounters:  07/20/21 224 lb 14.4 oz (102 kg)  07/15/21 227 lb 6.4 oz (103.1 kg)  07/13/21 229 lb 11.2 oz (104.2 kg)   Physical Exam Vitals reviewed.  Constitutional:      Appearance: Normal appearance. He is obese.  Cardiovascular:     Rate and Rhythm: Normal rate and regular rhythm.     Pulses: Normal pulses.     Heart sounds: Normal heart sounds.  Pulmonary:     Effort: Pulmonary effort is normal.     Breath sounds: Normal breath sounds.  Neurological:     General: No focal deficit present.     Mental Status: He is alert and oriented to person, place, and time.  Psychiatric:        Mood and Affect: Mood normal.        Behavior: Behavior normal.     LABORATORY DATA:  I have reviewed the labs as listed.  CBC Latest Ref Rng & Units 07/20/2021 07/13/2021 07/06/2021  WBC 4.0 - 10.5 K/uL 4.7 4.6 4.4   Hemoglobin 13.0 - 17.0 g/dL 9.0(L) 7.4(L) 8.0(L)  Hematocrit 39.0 - 52.0 % 27.3(L) 22.6(L) 24.0(L)  Platelets 150 - 400 K/uL 37(L) 41(L) 47(L)   CMP Latest Ref Rng & Units 07/20/2021 07/13/2021 06/29/2021  Glucose 70 - 99 mg/dL 289(H) 248(H) 297(H)  BUN 8 - 23 mg/dL 32(H) 33(H) 29(H)  Creatinine 0.61 - 1.24 mg/dL 1.28(H) 1.21 1.30(H)  Sodium 135 - 145 mmol/L 132(L) 131(L) 132(L)  Potassium 3.5 - 5.1 mmol/L 4.3 4.5 4.3  Chloride 98 - 111 mmol/L 104 105 105  CO2 22 - 32 mmol/L 18(L) 18(L) 19(L)  Calcium 8.9 - 10.3 mg/dL 7.8(L) 7.8(L) 7.4(L)  Total Protein 6.5 - 8.1 g/dL 6.0(L) 5.8(L) 5.6(L)  Total Bilirubin 0.3 - 1.2 mg/dL 1.0 0.7 0.9  Alkaline Phos 38 - 126 U/L 300(H) 294(H) 278(H)  AST 15 - 41 U/L 81(H) 82(H) 61(H)  ALT 0 - 44 U/L 150(H) 155(H) 123(H)    DIAGNOSTIC IMAGING:  I have independently reviewed the scans and discussed with the patient. NM Bone Scan Whole Body  Result Date: 07/15/2021 CLINICAL DATA:  Metastatic disease evaluation. History of metastatic prostate cancer. EXAM: NUCLEAR MEDICINE WHOLE BODY BONE SCAN TECHNIQUE: Whole body anterior and posterior images were obtained approximately 3 hours after intravenous injection of radiopharmaceutical. RADIOPHARMACEUTICALS:  19.5 mCi Technetium-56m MDP IV COMPARISON:  Bone scan 04/27/2021.  CT 04/24/2021. FINDINGS: Bilateral renal function and excretion noted. Multiple areas of increased activity again noted throughout the axial and appendicular skeleton.  Increased activity is again noted over both shoulders, throughout the spine, throughout the ribs bilaterally, throughout the pelvis, and throughout both femurs and tibias. A new punctate area of increased activity is noted over the mandible. This could represent a metastatic focus. IMPRESSION: 1. Persistent diffuse metastatic disease throughout the axial and appendicular skeleton. Similar findings noted on prior exam. 2. New punctate area of increased activity noted over the mandible.  This could represent a new metastatic focus. Electronically Signed   By: Marcello Moores  Register M.D.   On: 07/15/2021 09:45   CT CHEST ABDOMEN PELVIS W CONTRAST  Result Date: 07/15/2021 CLINICAL DATA:  Follow-up prostate cancer EXAM: CT CHEST, ABDOMEN, AND PELVIS WITH CONTRAST TECHNIQUE: Multidetector CT imaging of the chest, abdomen and pelvis was performed following the standard protocol during bolus administration of intravenous contrast. RADIATION DOSE REDUCTION: This exam was performed according to the departmental dose-optimization program which includes automated exposure control, adjustment of the mA and/or kV according to patient size and/or use of iterative reconstruction technique. CONTRAST:  175mL OMNIPAQUE IOHEXOL 300 MG/ML  SOLN COMPARISON:  CT abdomen and pelvis dated April 24, 2021; CT chest, abdomen and pelvis dated June 02, 2020 FINDINGS: CT CHEST FINDINGS Cardiovascular: Normal heart size. No pericardial effusion. Left main and RCA coronary artery calcifications. Atherosclerotic disease of the thoracic aorta. Right chest wall port with tip near the superior cavoatrial junction. Mediastinum/Nodes: Esophagus and thyroid are unremarkable. Enlarged left hilar lymph node measuring 1.2 cm in short axis on series 2, image 38. Enlarged subcarinal lymph node measuring 1.1 cm on image 34. Lungs/Pleura: Central airways are patent. No consolidation, pleural effusion or pneumothorax. Mild bibasilar atelectasis. Musculoskeletal: Diffuse sclerotic osseous metastatic disease. CT ABDOMEN PELVIS FINDINGS Hepatobiliary: Heterogeneous lesion of the inferior right lobe of the liver measuring 5.9 x 5.2 cm on series 2, image 71, previously 4.2 x 3.1 cm smaller adjacent liver lesion in the inferior right hepatic lobe measuring 3.7 x 3.6 cm on image 74, previously measured 2.5 x 2.3 cm. Pancreas: Unremarkable. No pancreatic ductal dilatation or surrounding inflammatory changes. Spleen: Spleen is slightly increased in  size measuring 13.0 cm, previously 10.5 cm. Adrenals/Urinary Tract: Bilateral adrenal glands are unremarkable. Kidneys enhance symmetrically with no evidence of hydronephrosis or nephrolithiasis. Bladder is unremarkable. Stomach/Bowel: Stomach is within normal limits. Appendix appears normal. No evidence of bowel wall thickening, distention, or inflammatory changes. Vascular/Lymphatic: Aortic atherosclerosis. No enlarged abdominal or pelvic lymph nodes. Reproductive: Status post prostatectomy. Other: No abdominal wall hernia or abnormality. No abdominopelvic ascites. Musculoskeletal: Diffuse sclerotic osseous metastatic disease. IMPRESSION: 1. Interval increased size right hepatic lobe lesions, compatible with worsening liver metastatic disease. 2. Enlarged mediastinal and left hilar lymph nodes which are new when compared with prior chest CT dated June 02, 2020, concerning for metastatic disease. 3. Diffuse sclerotic osseous metastatic disease which is similar when compared with prior CT of the abdomen and pelvis. Correlate with same day bone scan. 4.  Aortic Atherosclerosis (ICD10-I70.0). Electronically Signed   By: Yetta Glassman M.D.   On: 07/15/2021 14:04     ASSESSMENT:  1.  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 -Abiraterone and prednisone from 06/16/2018 through 11/06/2019 with progression. -Xtandi started around 11/21/2019, discontinued on 12/05/2019 due to mood changes. -ST elevation MI involving the right coronary artery on 12/10/2019, status post cardiac catheterization showing thrombotic occlusion of the proximal RCA, successful PCI with stent placement. -[employer]ae testing was negative for germline mutations. -Guardant 360  showed AR amplification.  No other mutations. -Bicalutamide 50 mg daily from 04/02/2020 through 05/12/2020 with progression. -Bone scan on 06/02/2020 with increased activity throughout the sacrum with no other  bony abnormalities. -CT CAP from 06/02/2020 with no visceral metastatic disease. -[place] is not compatible with Brilinta. -6 cycles of docetaxel from 06/23/2020 through 10/05/2020. - 5 treatments of radium-223 from 12/09/2020 through 04/13/2021. - CT scan and bone scan showed progressive disease with 2 hypodense liver lesions. - Cabazitaxel cycle 1 started on 04/30/2021   PLAN:  1.  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  His last chemotherapy was cycle 2 on 06/02/2021. - We have reviewed bone scan from 07/15/2021 which showed persistent diffuse metastatic disease throughout the axial and appendicular system with a new punctate area over the mandible. - We reviewed CT CAP from 07/14/2021 which showed right lobe of the liver lesion increased to 5.9 x 5.2 cm (4.2 x 3.1 cm).  Second lesion measures 3.7 x 3.6 cm (2.5 x 2.3 cm).  Splenomegaly measuring 13 cm.  Enlarged left hilar lymph node measuring 1.2 cm.  Enlarged subcarinal lymph node 1.1 cm. - Labs today reviewed by me shows platelet count 37.  Hemoglobin 9.0.  White count is normal at 4.7 with normal ANC.  AST and ALT are elevated at 81 and 150 respectively. - He is feeling very short of breath despite hemoglobin 9.0.  Because of his cardiac history, I have recommended 1 unit PRBC. - Because of his thrombocytopenia, he is unable to receive next cycle of chemo. - I think he had developed severe bone marrow suppression from radium-223. - I have recommended bone marrow aspiration and biopsy as soon as possible for further evaluation. - RTC after the bone marrow biopsy. - Should the platelet count not improve, he is also not a candidate for 177 LuPSMA treatments.  He will receive 2 Lupron injection today.    2.  Bone metastasis: - Calcium today 7.5 with albumin 2.4.  Last Delton See was on 07/13/2021.  Continue calcium supplements.   3.  Peripheral neuropathy: - He feels like he is  walking on bubble wrap.  No worsening since cabazitaxel started. - However he reports pain in the left shin which started couple of days ago.  Some dilated veins over the left leg. - Recommend ultrasound of the left leg.   4.  CAD and stents: - Continue aspirin 81 mg daily.  5.  Body pains: - Continue ibuprofen every 8 hours as needed.  We will start him on oxycodone 10 mg every 8 hours as needed.  He tried hydrocodone which did not help.  6.  Anxiety/depression: - We will start him on Celexa 10 mg daily for first 7 days followed by 20 mg daily. - We will also give him a short-term supply of Xanax 0.5 mg every 8 hours as needed.   Orders placed this encounter:  Orders Placed This Encounter  Procedures   US Venous Img Lower Unilateral Left   CT BIOPSY   CT BONE MARROW BIOPSY & ASPIRATION     Derek Jack, MD [place] [phone]   I, Thana Ates, am acting as a scribe for Dr. Derek Jack.  I, Derek Jack MD, have reviewed the above documentation for accuracy and completeness, and I agree with the above.   '

## 2021-07-20 NOTE — Patient Instructions (Signed)
[facility]  Discharge Instructions: °Thank you for choosing [facility] to provide your oncology and hematology care.  °If you have a lab appointment with the Cancer Center, please come in thru the Main Entrance and check in at the main information desk. ° °Wear comfortable clothing and clothing appropriate for easy access to any Portacath or PICC line.  ° °We strive to give you quality time with your provider. You may need to reschedule your appointment if you arrive late (15 or more minutes).  Arriving late affects you and other patients whose appointments are after yours.  Also, if you miss three or more appointments without notifying the office, you may be dismissed from the clinic at the provider’s discretion.    °  °For prescription refill requests, have your pharmacy contact our office and allow 72 hours for refills to be completed.   ° °Today you received the following: 1 unit of blood.     °  °To help prevent nausea and vomiting after your treatment, we encourage you to take your nausea medication as directed. ° °BELOW ARE SYMPTOMS THAT SHOULD BE REPORTED IMMEDIATELY: °*FEVER GREATER THAN 100.4 F (38 °C) OR HIGHER °*CHILLS OR SWEATING °*NAUSEA AND VOMITING THAT IS NOT CONTROLLED WITH YOUR NAUSEA MEDICATION °*UNUSUAL SHORTNESS OF BREATH °*UNUSUAL BRUISING OR BLEEDING °*URINARY PROBLEMS (pain or burning when urinating, or frequent urination) °*BOWEL PROBLEMS (unusual diarrhea, constipation, pain near the anus) °TENDERNESS IN MOUTH AND THROAT WITH OR WITHOUT PRESENCE OF ULCERS (sore throat, sores in mouth, or a toothache) °UNUSUAL RASH, SWELLING OR PAIN  °UNUSUAL VAGINAL DISCHARGE OR ITCHING  ° °Items with * indicate a potential emergency and should be followed up as soon as possible or go to the Emergency Department if any problems should occur. ° °Please show the CHEMOTHERAPY ALERT CARD or IMMUNOTHERAPY ALERT CARD at check-in to the Emergency Department and triage nurse. ° °Should  you have questions after your visit or need to cancel or reschedule your appointment, please contact [facility] [phone]  and follow the prompts.  Office hours are 8:00 a.m. to 4:30 p.m. Monday - Friday. Please note that voicemails left after 4:00 p.m. may not be returned until the following business day.  We are closed weekends and major holidays. You have access to a nurse at all times for urgent questions. Please call the main number to the clinic [phone] and follow the prompts. ° °For any non-urgent questions, you may also contact your provider using MyChart. We now offer e-Visits for anyone 18 and older to request care online for non-urgent symptoms. For details visit [email or web address]. °  °Also download the MyChart app! Go to the app store, search "MyChart", open the app, select [facility], and log in with your MyChart username and password. ° °Due to Covid, a mask is required upon entering the hospital/clinic. If you do not have a mask, one will be given to you upon arrival. For doctor visits, patients may have 1 support person aged 18 or older with them. For treatment visits, patients cannot have anyone with them due to current Covid guidelines and our immunocompromised population.  °

## 2021-07-21 ENCOUNTER — Ambulatory Visit (HOSPITAL_COMMUNITY)
Admission: RE | Admit: 2021-07-21 | Discharge: 2021-07-21 | Disposition: A | Discharge status: Home / Self Care | Payer: Medicare Other | Source: Ambulatory Visit | Attending: Hematology | Admitting: Hematology

## 2021-07-21 DIAGNOSIS — M79662 Pain in left lower leg: Secondary | ICD-10-CM | POA: Insufficient documentation

## 2021-07-21 DIAGNOSIS — M7989 Other specified soft tissue disorders: Secondary | ICD-10-CM | POA: Insufficient documentation

## 2021-07-21 LAB — TYPE AND SCREEN
ABO/RH(D): O POS
Antibody Screen: NEGATIVE
Unit division: 0

## 2021-07-21 LAB — BPAM RBC
Blood Product Expiration Date: 202303122359
ISSUE DATE / TIME: 202302061124
Unit Type and Rh: 5100

## 2021-07-22 ENCOUNTER — Ambulatory Visit (HOSPITAL_COMMUNITY): Payer: Medicare Other

## 2021-07-24 ENCOUNTER — Encounter (HOSPITAL_COMMUNITY): Payer: Self-pay

## 2021-07-24 NOTE — Progress Notes (Signed)
Patient's wife called concerning patient having symptoms of thrush. Patient already has nystatin so he will start taking that and if not improving wife will call back for further instructions.

## 2021-07-27 ENCOUNTER — Ambulatory Visit (HOSPITAL_COMMUNITY): Payer: Medicare Other | Admitting: Hematology

## 2021-07-27 ENCOUNTER — Inpatient Hospital Stay (HOSPITAL_COMMUNITY): Payer: Medicare Other

## 2021-07-27 ENCOUNTER — Other Ambulatory Visit (HOSPITAL_COMMUNITY): Payer: Medicare Other

## 2021-07-27 ENCOUNTER — Other Ambulatory Visit: Payer: Self-pay

## 2021-07-27 ENCOUNTER — Ambulatory Visit (HOSPITAL_COMMUNITY): Payer: Medicare Other

## 2021-07-27 ENCOUNTER — Encounter (HOSPITAL_COMMUNITY): Payer: Self-pay

## 2021-07-27 VITALS — BP 105/59 | HR 81 | Temp 97.0°F | Resp 20

## 2021-07-27 DIAGNOSIS — C61 Malignant neoplasm of prostate: Secondary | ICD-10-CM

## 2021-07-27 DIAGNOSIS — D649 Anemia, unspecified: Secondary | ICD-10-CM

## 2021-07-27 DIAGNOSIS — Z5111 Encounter for antineoplastic chemotherapy: Secondary | ICD-10-CM | POA: Diagnosis not present

## 2021-07-27 DIAGNOSIS — C7951 Secondary malignant neoplasm of bone: Secondary | ICD-10-CM

## 2021-07-27 DIAGNOSIS — Z95828 Presence of other vascular implants and grafts: Secondary | ICD-10-CM

## 2021-07-27 LAB — CBC WITH DIFFERENTIAL/PLATELET
Abs Immature Granulocytes: 0.46 10*3/uL — ABNORMAL HIGH (ref 0.00–0.07)
Basophils Absolute: 0 10*3/uL (ref 0.0–0.1)
Basophils Relative: 1 %
Eosinophils Absolute: 0 10*3/uL (ref 0.0–0.5)
Eosinophils Relative: 1 %
HCT: 24.4 % — ABNORMAL LOW (ref 39.0–52.0)
Hemoglobin: 8.1 g/dL — ABNORMAL LOW (ref 13.0–17.0)
Immature Granulocytes: 11 %
Lymphocytes Relative: 11 %
Lymphs Abs: 0.5 10*3/uL — ABNORMAL LOW (ref 0.7–4.0)
MCH: 31.2 pg (ref 26.0–34.0)
MCHC: 33.2 g/dL (ref 30.0–36.0)
MCV: 93.8 fL (ref 80.0–100.0)
Monocytes Absolute: 0.3 10*3/uL (ref 0.1–1.0)
Monocytes Relative: 8 %
Neutro Abs: 3 10*3/uL (ref 1.7–7.7)
Neutrophils Relative %: 68 %
Platelets: 35 10*3/uL — ABNORMAL LOW (ref 150–400)
RBC: 2.6 MIL/uL — ABNORMAL LOW (ref 4.22–5.81)
RDW: 15.8 % — ABNORMAL HIGH (ref 11.5–15.5)
WBC: 4.3 10*3/uL (ref 4.0–10.5)
nRBC: 3 % — ABNORMAL HIGH (ref 0.0–0.2)

## 2021-07-27 LAB — COMPREHENSIVE METABOLIC PANEL
ALT: 160 U/L — ABNORMAL HIGH (ref 0–44)
AST: 86 U/L — ABNORMAL HIGH (ref 15–41)
Albumin: 2 g/dL — ABNORMAL LOW (ref 3.5–5.0)
Alkaline Phosphatase: 278 U/L — ABNORMAL HIGH (ref 38–126)
Anion gap: 10 (ref 5–15)
BUN: 31 mg/dL — ABNORMAL HIGH (ref 8–23)
CO2: 19 mmol/L — ABNORMAL LOW (ref 22–32)
Calcium: 7.5 mg/dL — ABNORMAL LOW (ref 8.9–10.3)
Chloride: 104 mmol/L (ref 98–111)
Creatinine, Ser: 1.02 mg/dL (ref 0.61–1.24)
GFR, Estimated: >60 mL/min (ref >60)
Glucose, Bld: 243 mg/dL — ABNORMAL HIGH (ref 70–99)
Potassium: 4.6 mmol/L (ref 3.5–5.1)
Sodium: 133 mmol/L — ABNORMAL LOW (ref 135–145)
Total Bilirubin: 1 mg/dL (ref 0.3–1.2)
Total Protein: 5.5 g/dL — ABNORMAL LOW (ref 6.5–8.1)

## 2021-07-27 LAB — PSA: Prostatic Specific Antigen: 2223 ng/mL — ABNORMAL HIGH (ref 0.00–4.00)

## 2021-07-27 LAB — MAGNESIUM: Magnesium: 1.6 mg/dL — ABNORMAL LOW (ref 1.7–2.4)

## 2021-07-27 LAB — PREPARE RBC (CROSSMATCH)

## 2021-07-27 MED ORDER — SODIUM CHLORIDE 0.9% IV SOLUTION
250.0000 mL | Freq: Once | INTRAVENOUS | Status: AC
  Administered 2021-07-27: 250 mL via INTRAVENOUS

## 2021-07-27 MED ORDER — DIPHENHYDRAMINE HCL 25 MG PO CAPS
25.0000 mg | ORAL_CAPSULE | Freq: Once | ORAL | Status: AC
  Administered 2021-07-27: 25 mg via ORAL
  Filled 2021-07-27: qty 1

## 2021-07-27 MED ORDER — HEPARIN SOD (PORK) LOCK FLUSH 100 UNIT/ML IV SOLN
500.0000 [IU] | Freq: Every day | INTRAVENOUS | Status: AC | PRN
  Administered 2021-07-27: 500 [IU]

## 2021-07-27 MED ORDER — MAGNESIUM OXIDE -MG SUPPLEMENT 400 (240 MG) MG PO TABS
400.0000 mg | ORAL_TABLET | Freq: Once | ORAL | Status: AC
  Administered 2021-07-27: 400 mg via ORAL
  Filled 2021-07-27: qty 1

## 2021-07-27 MED ORDER — SODIUM CHLORIDE 0.9% FLUSH
10.0000 mL | INTRAVENOUS | Status: AC | PRN
  Administered 2021-07-27: 10 mL

## 2021-07-27 MED ORDER — ACETAMINOPHEN 325 MG PO TABS
650.0000 mg | ORAL_TABLET | Freq: Once | ORAL | Status: AC
  Administered 2021-07-27: 650 mg via ORAL
  Filled 2021-07-27: qty 2

## 2021-07-27 NOTE — Patient Instructions (Signed)
[facility]  Discharge Instructions: Thank you for choosing [facility] to provide your oncology and hematology care.  If you have a lab appointment with the [facility], please come in thru the Main Entrance and check in at the main information desk.  Wear comfortable clothing and clothing appropriate for easy access to any Portacath or PICC line.   We strive to give you quality time with your provider. You may need to reschedule your appointment if you arrive late (15 or more minutes).  Arriving late affects you and other patients whose appointments are after yours.  Also, if you miss three or more appointments without notifying the office, you may be dismissed from the clinic at the providers discretion.      For prescription refill requests, have your pharmacy contact our office and allow 72 hours for refills to be completed.    Today you received the following 1 unit of PRBCs and 400mg  of PO Magnesium oxide, return as scheduled.   To help prevent nausea and vomiting after your treatment, we encourage you to take your nausea medication as directed.  BELOW ARE SYMPTOMS THAT SHOULD BE REPORTED IMMEDIATELY: *FEVER GREATER THAN 100.4 F (38 C) OR HIGHER *CHILLS OR SWEATING *NAUSEA AND VOMITING THAT IS NOT CONTROLLED WITH YOUR NAUSEA MEDICATION *UNUSUAL SHORTNESS OF BREATH *UNUSUAL BRUISING OR BLEEDING *URINARY PROBLEMS (pain or burning when urinating, or frequent urination) *BOWEL PROBLEMS (unusual diarrhea, constipation, pain near the anus) TENDERNESS IN MOUTH AND THROAT WITH OR WITHOUT PRESENCE OF ULCERS (sore throat, sores in mouth, or a toothache) UNUSUAL RASH, SWELLING OR PAIN  UNUSUAL VAGINAL DISCHARGE OR ITCHING   Items with * indicate a potential emergency and should be followed up as soon as possible or go to the Emergency Department if any problems should occur.  Please show the CHEMOTHERAPY ALERT CARD or IMMUNOTHERAPY ALERT CARD at check-in to the  Emergency Department and triage nurse.  Should you have questions after your visit or need to cancel or reschedule your appointment, please contact [facility] [phone]  and follow the prompts.  Office hours are 8:00 a.m. to 4:30 p.m. Monday - Friday. Please note that voicemails left after 4:00 p.m. may not be returned until the following business day.  We are closed weekends and major holidays. You have access to a nurse at all times for urgent questions. Please call the main number to the clinic [phone] and follow the prompts.  For any non-urgent questions, you may also contact your provider using MyChart. We now offer e-Visits for anyone 81 and older to request care online for non-urgent symptoms. For details visit [email or web address].   Also download the MyChart app! Go to the app store, search "MyChart", open the app, select [facility], and log in with your MyChart username and password.  Due to Covid, a mask is required upon entering the hospital/clinic. If you do not have a mask, one will be given to you upon arrival. For doctor visits, patients may have 1 support person aged 54 or older with them. For treatment visits, patients cannot have anyone with them due to current Covid guidelines and our immunocompromised population.

## 2021-07-27 NOTE — Progress Notes (Signed)
Patient presents today for possible blood. Patient reports SOB and Fatigue. Hemoglobin 8.1, Platelets 35, Magnesium 1.6, Dr. Delton Coombes made aware. Orders received for 1 unit of blood and 400mg  of PO magnesium oxide.  Patient tolerated blood transfusion with no complaints voiced. Side effects with management reviewed with understanding verbalized. Port site clean and dry with no bruising or swelling noted at site. Good blood return noted before and after administration of therapy. Band aid applied. Patient left in satisfactory condition with VSS and no s/s of distress noted.

## 2021-07-28 LAB — TYPE AND SCREEN
ABO/RH(D): O POS
Antibody Screen: NEGATIVE
Unit division: 0

## 2021-07-28 LAB — BPAM RBC
Blood Product Expiration Date: 202303132359
ISSUE DATE / TIME: 202302130952
Unit Type and Rh: 5100

## 2021-07-28 NOTE — Progress Notes (Signed)
INDICATION: refractory anemia, pancytopenia  Brief examination was performed. ENT: adequate airway clearance Heart: regular rate and rhythm.No Murmurs Lungs: clear to auscultation, no wheezes, normal respiratory effort  Bone Marrow Biopsy and Aspiration Procedure Note   Informed consent was obtained and potential risks including bleeding, infection and pain were reviewed with the patient.  The patient's name, date of birth, identification, consent and allergies were verified prior to the start of procedure and time out was performed.  The right posterior iliac crest was chosen as the site of biopsy.  The skin was prepped with ChloraPrep.   10 cc of 2% lidocaine was used to provide local anaesthesia.   10 cc of bone marrow aspirate was obtained, aspirate aparticulate therefore 2cm biopsies were obtained.  Pressure was applied to the biopsy site and bandage was placed over the biopsy site. Patient was made to lie on the back for 30 mins prior to discharge. Obtained labs from patient including blood film.    The procedure was tolerated well. COMPLICATIONS: None BLOOD LOSS: none The patient was discharged home in stable condition with a 1 week follow up to review results.  Patient was provided with post bone marrow biopsy instructions and instructed to call if there was any bleeding or worsening pain.  Specimens sent for flow cytometry, cytogenetics and additional studies.  Signed Scot Dock, NP

## 2021-07-29 ENCOUNTER — Inpatient Hospital Stay: Payer: Medicare Other | Attending: Hematology | Admitting: Adult Health

## 2021-07-29 ENCOUNTER — Other Ambulatory Visit: Payer: Self-pay

## 2021-07-29 ENCOUNTER — Inpatient Hospital Stay: Payer: Medicare Other

## 2021-07-29 ENCOUNTER — Ambulatory Visit (HOSPITAL_COMMUNITY): Payer: Medicare Other

## 2021-07-29 VITALS — BP 98/78 | HR 118 | Temp 98.2°F | Resp 20

## 2021-07-29 DIAGNOSIS — C7951 Secondary malignant neoplasm of bone: Secondary | ICD-10-CM | POA: Diagnosis present

## 2021-07-29 DIAGNOSIS — D464 Refractory anemia, unspecified: Secondary | ICD-10-CM | POA: Diagnosis present

## 2021-07-29 DIAGNOSIS — C61 Malignant neoplasm of prostate: Secondary | ICD-10-CM | POA: Insufficient documentation

## 2021-07-29 DIAGNOSIS — D4620 Refractory anemia with excess of blasts, unspecified: Secondary | ICD-10-CM | POA: Insufficient documentation

## 2021-07-29 DIAGNOSIS — C7952 Secondary malignant neoplasm of bone marrow: Secondary | ICD-10-CM

## 2021-07-29 DIAGNOSIS — D61818 Other pancytopenia: Secondary | ICD-10-CM | POA: Diagnosis not present

## 2021-07-29 LAB — CMP (CANCER CENTER ONLY)
ALT: 108 U/L — ABNORMAL HIGH (ref 0–44)
AST: 60 U/L — ABNORMAL HIGH (ref 15–41)
Albumin: 2.4 g/dL — ABNORMAL LOW (ref 3.5–5.0)
Alkaline Phosphatase: 345 U/L — ABNORMAL HIGH (ref 38–126)
Anion gap: 9 (ref 5–15)
BUN: 26 mg/dL — ABNORMAL HIGH (ref 8–23)
CO2: 20 mmol/L — ABNORMAL LOW (ref 22–32)
Calcium: 7.6 mg/dL — ABNORMAL LOW (ref 8.9–10.3)
Chloride: 106 mmol/L (ref 98–111)
Creatinine: 0.99 mg/dL (ref 0.61–1.24)
GFR, Estimated: >60 mL/min (ref >60)
Glucose, Bld: 158 mg/dL — ABNORMAL HIGH (ref 70–99)
Potassium: 4.1 mmol/L (ref 3.5–5.1)
Sodium: 135 mmol/L (ref 135–145)
Total Bilirubin: 0.9 mg/dL (ref 0.3–1.2)
Total Protein: 6.1 g/dL — ABNORMAL LOW (ref 6.5–8.1)

## 2021-07-29 LAB — CBC WITH DIFFERENTIAL (CANCER CENTER ONLY)
Abs Immature Granulocytes: 0.56 10*3/uL — ABNORMAL HIGH (ref 0.00–0.07)
Basophils Absolute: 0 10*3/uL (ref 0.0–0.1)
Basophils Relative: 0 %
Eosinophils Absolute: 0 10*3/uL (ref 0.0–0.5)
Eosinophils Relative: 0 %
HCT: 28.3 % — ABNORMAL LOW (ref 39.0–52.0)
Hemoglobin: 9.4 g/dL — ABNORMAL LOW (ref 13.0–17.0)
Immature Granulocytes: 11 %
Lymphocytes Relative: 13 %
Lymphs Abs: 0.7 10*3/uL (ref 0.7–4.0)
MCH: 29.9 pg (ref 26.0–34.0)
MCHC: 33.2 g/dL (ref 30.0–36.0)
MCV: 90.1 fL (ref 80.0–100.0)
Monocytes Absolute: 0.4 10*3/uL (ref 0.1–1.0)
Monocytes Relative: 8 %
Neutro Abs: 3.5 10*3/uL (ref 1.7–7.7)
Neutrophils Relative %: 68 %
Platelet Count: 32 10*3/uL — ABNORMAL LOW (ref 150–400)
RBC: 3.14 MIL/uL — ABNORMAL LOW (ref 4.22–5.81)
RDW: 15.4 % (ref 11.5–15.5)
Smear Review: NORMAL
WBC Count: 5.2 10*3/uL (ref 4.0–10.5)
nRBC: 2.1 % — ABNORMAL HIGH (ref 0.0–0.2)

## 2021-07-29 LAB — SAMPLE TO BLOOD BANK

## 2021-07-29 LAB — SAVE SMEAR(SSMR), FOR PROVIDER SLIDE REVIEW

## 2021-07-29 LAB — LACTATE DEHYDROGENASE: LDH: 544 U/L — ABNORMAL HIGH (ref 98–192)

## 2021-07-29 MED ORDER — ACETAMINOPHEN 500 MG PO TABS
1000.0000 mg | ORAL_TABLET | Freq: Once | ORAL | Status: AC
  Administered 2021-07-29: 1000 mg via ORAL
  Filled 2021-07-29: qty 2

## 2021-07-29 NOTE — Progress Notes (Signed)
Patient waited 30 minutes in the bed with pressure on his site from his bone marrow biopsy. No bleeding coming from the site. Bandage intact VSS upon discharge.

## 2021-07-29 NOTE — Patient Instructions (Signed)
Bone Marrow Aspiration and Bone Marrow Biopsy, Adult, Care After This sheet gives you information about how to care for yourself after your procedure. Your health care provider may also give you more specific instructions. If you have problems or questions, contact your health care provider. What can I expect after the procedure? After the procedure, it is common to have: Mild pain and tenderness. Swelling. Bruising. Follow these instructions at home: Puncture site care  Follow instructions from your health care provider about how to take care of the puncture site. Make sure you: Wash your hands with soap and water before and after you change your bandage (dressing). If soap and water are not available, use hand sanitizer. Change your dressing as told by your health care provider. Check your puncture site every day for signs of infection. Check for: More redness, swelling, or pain. Fluid or blood. Warmth. Pus or a bad smell. Activity Return to your normal activities as told by your health care provider. Ask your health care provider what activities are safe for you. Do not lift anything that is heavier than 10 lb (4.5 kg), or the limit that you are told, until your health care provider says that it is safe. Do not drive for 24 hours if you were given a sedative during your procedure. General instructions  Take over-the-counter and prescription medicines only as told by your health care provider. Do not take baths, swim, or use a hot tub until your health care provider approves. Ask your health care provider if you may take showers. You may only be allowed to take sponge baths. If directed, put ice on the affected area. To do this: Put ice in a plastic bag. Place a towel between your skin and the bag. Leave the ice on for 20 minutes, 2-3 times a day. Keep all follow-up visits as told by your health care provider. This is important. Contact a health care provider if: Your pain is not  controlled with medicine. You have a fever. You have more redness, swelling, or pain around the puncture site. You have fluid or blood coming from the puncture site. Your puncture site feels warm to the touch. You have pus or a bad smell coming from the puncture site. Summary After the procedure, it is common to have mild pain, tenderness, swelling, and bruising. Follow instructions from your health care provider about how to take care of the puncture site and what activities are safe for you. Take over-the-counter and prescription medicines only as told by your health care provider. Contact a health care provider if you have any signs of infection, such as fluid or blood coming from the puncture site. This information is not intended to replace advice given to you by your health care provider. Make sure you discuss any questions you have with your health care provider. Document Revised: 10/17/2018 Document Reviewed: 10/17/2018 Elsevier Patient Education  [employer].

## 2021-07-30 ENCOUNTER — Other Ambulatory Visit (HOSPITAL_COMMUNITY): Payer: Self-pay

## 2021-07-30 ENCOUNTER — Other Ambulatory Visit (HOSPITAL_COMMUNITY): Payer: Self-pay | Admitting: Hematology

## 2021-07-30 MED ORDER — CITALOPRAM HYDROBROMIDE 10 MG PO TABS: 10.0000 mg | ORAL_TABLET | Freq: Every day | ORAL | 3 refills | Status: AC

## 2021-07-30 MED ORDER — CITALOPRAM HYDROBROMIDE 20 MG PO TABS: 20.0000 mg | ORAL_TABLET | Freq: Every day | ORAL | 3 refills | Status: DC

## 2021-07-30 NOTE — Telephone Encounter (Signed)
Call received from patient's wife that he gets sleepy on 10mg  Citalopram requests refill on that instead of increase to 20mg . Dr. Delton Coombes made aware. Refill sent.

## 2021-08-03 ENCOUNTER — Inpatient Hospital Stay (HOSPITAL_COMMUNITY): Payer: Medicare Other

## 2021-08-03 ENCOUNTER — Other Ambulatory Visit: Payer: Self-pay

## 2021-08-03 VITALS — BP 92/57 | HR 76 | Temp 96.8°F | Resp 16

## 2021-08-03 DIAGNOSIS — Z95828 Presence of other vascular implants and grafts: Secondary | ICD-10-CM

## 2021-08-03 DIAGNOSIS — C61 Malignant neoplasm of prostate: Secondary | ICD-10-CM

## 2021-08-03 DIAGNOSIS — C7951 Secondary malignant neoplasm of bone: Secondary | ICD-10-CM

## 2021-08-03 DIAGNOSIS — D649 Anemia, unspecified: Secondary | ICD-10-CM

## 2021-08-03 DIAGNOSIS — Z5111 Encounter for antineoplastic chemotherapy: Secondary | ICD-10-CM | POA: Diagnosis not present

## 2021-08-03 LAB — CBC WITH DIFFERENTIAL/PLATELET
Basophils Absolute: 0.1 10*3/uL (ref 0.0–0.1)
Basophils Relative: 2 %
Eosinophils Absolute: 0 10*3/uL (ref 0.0–0.5)
Eosinophils Relative: 0 %
HCT: 23.4 % — ABNORMAL LOW (ref 39.0–52.0)
Hemoglobin: 7.5 g/dL — ABNORMAL LOW (ref 13.0–17.0)
Lymphocytes Relative: 10 %
Lymphs Abs: 0.4 10*3/uL — ABNORMAL LOW (ref 0.7–4.0)
MCH: 30.1 pg (ref 26.0–34.0)
MCHC: 32.1 g/dL (ref 30.0–36.0)
MCV: 94 fL (ref 80.0–100.0)
Metamyelocytes Relative: 2 %
Monocytes Absolute: 0.1 10*3/uL (ref 0.1–1.0)
Monocytes Relative: 3 %
Myelocytes: 1 %
Neutro Abs: 3.3 10*3/uL (ref 1.7–7.7)
Neutrophils Relative %: 82 %
Platelets: 26 10*3/uL — CL (ref 150–400)
RBC: 2.49 MIL/uL — ABNORMAL LOW (ref 4.22–5.81)
RDW: 15.7 % — ABNORMAL HIGH (ref 11.5–15.5)
WBC: 4 10*3/uL (ref 4.0–10.5)
nRBC: 2.2 % — ABNORMAL HIGH (ref 0.0–0.2)
nRBC: 3 /100 WBC — ABNORMAL HIGH

## 2021-08-03 LAB — COMPREHENSIVE METABOLIC PANEL
ALT: 111 U/L — ABNORMAL HIGH (ref 0–44)
AST: 75 U/L — ABNORMAL HIGH (ref 15–41)
Albumin: 2 g/dL — ABNORMAL LOW (ref 3.5–5.0)
Alkaline Phosphatase: 290 U/L — ABNORMAL HIGH (ref 38–126)
Anion gap: 11 (ref 5–15)
BUN: 31 mg/dL — ABNORMAL HIGH (ref 8–23)
CO2: 19 mmol/L — ABNORMAL LOW (ref 22–32)
Calcium: 7.7 mg/dL — ABNORMAL LOW (ref 8.9–10.3)
Chloride: 101 mmol/L (ref 98–111)
Creatinine, Ser: 1.02 mg/dL (ref 0.61–1.24)
GFR, Estimated: >60 mL/min (ref >60)
Glucose, Bld: 252 mg/dL — ABNORMAL HIGH (ref 70–99)
Potassium: 4.3 mmol/L (ref 3.5–5.1)
Sodium: 131 mmol/L — ABNORMAL LOW (ref 135–145)
Total Bilirubin: 0.9 mg/dL (ref 0.3–1.2)
Total Protein: 5.3 g/dL — ABNORMAL LOW (ref 6.5–8.1)

## 2021-08-03 LAB — PREPARE RBC (CROSSMATCH)

## 2021-08-03 LAB — PSA: Prostatic Specific Antigen: 1833 ng/mL — ABNORMAL HIGH (ref 0.00–4.00)

## 2021-08-03 LAB — MAGNESIUM: Magnesium: 1.6 mg/dL — ABNORMAL LOW (ref 1.7–2.4)

## 2021-08-03 MED ORDER — ACETAMINOPHEN 325 MG PO TABS
650.0000 mg | ORAL_TABLET | Freq: Once | ORAL | Status: AC
  Administered 2021-08-03: 650 mg via ORAL
  Filled 2021-08-03: qty 2

## 2021-08-03 MED ORDER — DIPHENHYDRAMINE HCL 25 MG PO CAPS
25.0000 mg | ORAL_CAPSULE | Freq: Once | ORAL | Status: AC
  Administered 2021-08-03: 25 mg via ORAL
  Filled 2021-08-03: qty 1

## 2021-08-03 MED ORDER — SODIUM CHLORIDE 0.9 % IV SOLN: INTRAVENOUS | Status: DC

## 2021-08-03 MED ORDER — HEPARIN SOD (PORK) LOCK FLUSH 100 UNIT/ML IV SOLN
500.0000 [IU] | Freq: Every day | INTRAVENOUS | Status: AC | PRN
  Administered 2021-08-03: 500 [IU]

## 2021-08-03 MED ORDER — SODIUM CHLORIDE 0.9% FLUSH
10.0000 mL | INTRAVENOUS | Status: AC | PRN
  Administered 2021-08-03: 10 mL

## 2021-08-03 MED ORDER — SODIUM CHLORIDE 0.9% IV SOLUTION
250.0000 mL | Freq: Once | INTRAVENOUS | Status: AC
  Administered 2021-08-03: 250 mL via INTRAVENOUS

## 2021-08-03 MED ORDER — MAGNESIUM SULFATE 2 GM/50ML IV SOLN
2.0000 g | Freq: Once | INTRAVENOUS | Status: AC
  Administered 2021-08-03: 2 g via INTRAVENOUS
  Filled 2021-08-03: qty 50

## 2021-08-03 MED ORDER — DENOSUMAB 120 MG/1.7ML ~~LOC~~ SOLN
120.0000 mg | Freq: Once | SUBCUTANEOUS | Status: AC
  Administered 2021-08-03: 120 mg via SUBCUTANEOUS
  Filled 2021-08-03: qty 1.7

## 2021-08-03 NOTE — Patient Instructions (Signed)
[facility]  Discharge Instructions: Thank you for choosing [facility] to provide your oncology and hematology care.  If you have a lab appointment with the [facility], please come in thru the Main Entrance and check in at the main information desk.  Wear comfortable clothing and clothing appropriate for easy access to any Portacath or PICC line.   We strive to give you quality time with your provider. You may need to reschedule your appointment if you arrive late (15 or more minutes).  Arriving late affects you and other patients whose appointments are after yours.  Also, if you miss three or more appointments without notifying the office, you may be dismissed from the clinic at the providers discretion.      For prescription refill requests, have your pharmacy contact our office and allow 72 hours for refills to be completed.    Today you received the following chemotherapy and/or immunotherapy agents Xgeva, Magnesium IV and 1UPRBC      To help prevent nausea and vomiting after your treatment, we encourage you to take your nausea medication as directed.  BELOW ARE SYMPTOMS THAT SHOULD BE REPORTED IMMEDIATELY: *FEVER GREATER THAN 100.4 F (38 C) OR HIGHER *CHILLS OR SWEATING *NAUSEA AND VOMITING THAT IS NOT CONTROLLED WITH YOUR NAUSEA MEDICATION *UNUSUAL SHORTNESS OF BREATH *UNUSUAL BRUISING OR BLEEDING *URINARY PROBLEMS (pain or burning when urinating, or frequent urination) *BOWEL PROBLEMS (unusual diarrhea, constipation, pain near the anus) TENDERNESS IN MOUTH AND THROAT WITH OR WITHOUT PRESENCE OF ULCERS (sore throat, sores in mouth, or a toothache) UNUSUAL RASH, SWELLING OR PAIN  UNUSUAL VAGINAL DISCHARGE OR ITCHING   Items with * indicate a potential emergency and should be followed up as soon as possible or go to the Emergency Department if any problems should occur.  Please show the CHEMOTHERAPY ALERT CARD or IMMUNOTHERAPY ALERT CARD at check-in to  the Emergency Department and triage nurse.  Should you have questions after your visit or need to cancel or reschedule your appointment, please contact [facility] [phone]  and follow the prompts.  Office hours are 8:00 a.m. to 4:30 p.m. Monday - Friday. Please note that voicemails left after 4:00 p.m. may not be returned until the following business day.  We are closed weekends and major holidays. You have access to a nurse at all times for urgent questions. Please call the main number to the clinic [phone] and follow the prompts.  For any non-urgent questions, you may also contact your provider using MyChart. We now offer e-Visits for anyone 42 and older to request care online for non-urgent symptoms. For details visit [email or web address].   Also download the MyChart app! Go to the app store, search "MyChart", open the app, select [facility], and log in with your MyChart username and password.  Due to Covid, a mask is required upon entering the hospital/clinic. If you do not have a mask, one will be given to you upon arrival. For doctor visits, patients may have 1 support person aged 36 or older with them. For treatment visits, patients cannot have anyone with them due to current Covid guidelines and our immunocompromised population.

## 2021-08-03 NOTE — Progress Notes (Signed)
CRITICAL VALUE ALERT Critical value received:  platelets 26 Date of notification:  08-03-21 Time of notification: 0945 Critical value read back:  Yes.   Nurse who received alert:  C. PageRN MD notified time and response:  [phone], will give one unit per orders.

## 2021-08-03 NOTE — Progress Notes (Signed)
Patients port flushed without difficulty.  Good blood return noted with no bruising or swelling noted at site.  Stable during access and blood draw.  Patient to remain accessed for possible blood transfusion.   

## 2021-08-03 NOTE — Progress Notes (Signed)
Patient presents today for possible blood transfusion and Xgeva injection per providers order.  Vital signs WNL.  Patient has no new complaints at this time.  Patient states that he has been taking Calcium and Vitamin D supplements, had had no jaw pain or dental work.  Calcium noted to be 7.7,  per pharmacist corrected Calcium 9.3.  Patient to get Xgeva per providers order.   Hgb 7.8 and Platelets 26 called to MD by charge nurse.  1 UPRBC and 2 grams IV Magnesium ordered by provider.    1 UPRBC, Xgeva and 2 grams of Magnesium given today per MD orders.  Tolerated infusion without adverse affects.  Vital signs stable.  No complaints at this time.  Discharge from clinic ambulatory in stable condition.  Alert and oriented X 3.  Follow up with [facility] as scheduled.

## 2021-08-04 LAB — TYPE AND SCREEN
ABO/RH(D): O POS
Antibody Screen: NEGATIVE
Unit division: 0

## 2021-08-04 LAB — BPAM RBC
Blood Product Expiration Date: 202303182359
ISSUE DATE / TIME: 202302201033
Unit Type and Rh: 5100

## 2021-08-05 ENCOUNTER — Other Ambulatory Visit (HOSPITAL_COMMUNITY): Payer: Self-pay | Admitting: Hematology

## 2021-08-05 DIAGNOSIS — C61 Malignant neoplasm of prostate: Secondary | ICD-10-CM

## 2021-08-05 DIAGNOSIS — Z95828 Presence of other vascular implants and grafts: Secondary | ICD-10-CM

## 2021-08-06 ENCOUNTER — Encounter (HOSPITAL_COMMUNITY): Payer: Self-pay | Admitting: Hematology

## 2021-08-12 ENCOUNTER — Inpatient Hospital Stay (HOSPITAL_COMMUNITY): Payer: Medicare Other

## 2021-08-12 ENCOUNTER — Ambulatory Visit (HOSPITAL_COMMUNITY): Payer: Medicare Other | Admitting: Hematology

## 2021-08-12 ENCOUNTER — Encounter (HOSPITAL_COMMUNITY): Payer: Medicare Other

## 2021-08-12 ENCOUNTER — Inpatient Hospital Stay (HOSPITAL_COMMUNITY): Payer: Medicare Other | Attending: Hematology

## 2021-08-12 ENCOUNTER — Other Ambulatory Visit: Payer: Self-pay

## 2021-08-12 ENCOUNTER — Telehealth (HOSPITAL_COMMUNITY): Payer: Self-pay | Admitting: *Deleted

## 2021-08-12 ENCOUNTER — Inpatient Hospital Stay (HOSPITAL_BASED_OUTPATIENT_CLINIC_OR_DEPARTMENT_OTHER): Payer: Medicare Other | Admitting: Hematology

## 2021-08-12 ENCOUNTER — Other Ambulatory Visit (HOSPITAL_COMMUNITY): Payer: Medicare Other

## 2021-08-12 VITALS — BP 95/46 | HR 92 | Temp 98.3°F | Resp 18

## 2021-08-12 DIAGNOSIS — C7951 Secondary malignant neoplasm of bone: Secondary | ICD-10-CM

## 2021-08-12 DIAGNOSIS — D696 Thrombocytopenia, unspecified: Secondary | ICD-10-CM | POA: Diagnosis present

## 2021-08-12 DIAGNOSIS — D649 Anemia, unspecified: Secondary | ICD-10-CM | POA: Insufficient documentation

## 2021-08-12 DIAGNOSIS — C61 Malignant neoplasm of prostate: Secondary | ICD-10-CM

## 2021-08-12 DIAGNOSIS — M79662 Pain in left lower leg: Secondary | ICD-10-CM | POA: Diagnosis not present

## 2021-08-12 DIAGNOSIS — M7989 Other specified soft tissue disorders: Secondary | ICD-10-CM

## 2021-08-12 DIAGNOSIS — Z95828 Presence of other vascular implants and grafts: Secondary | ICD-10-CM

## 2021-08-12 DIAGNOSIS — I959 Hypotension, unspecified: Secondary | ICD-10-CM

## 2021-08-12 LAB — COMPREHENSIVE METABOLIC PANEL
ALT: 62 U/L — ABNORMAL HIGH (ref 0–44)
AST: 60 U/L — ABNORMAL HIGH (ref 15–41)
Albumin: 2.1 g/dL — ABNORMAL LOW (ref 3.5–5.0)
Alkaline Phosphatase: 354 U/L — ABNORMAL HIGH (ref 38–126)
Anion gap: 10 (ref 5–15)
BUN: 25 mg/dL — ABNORMAL HIGH (ref 8–23)
CO2: 19 mmol/L — ABNORMAL LOW (ref 22–32)
Calcium: 7.5 mg/dL — ABNORMAL LOW (ref 8.9–10.3)
Chloride: 104 mmol/L (ref 98–111)
Creatinine, Ser: 0.96 mg/dL (ref 0.61–1.24)
GFR, Estimated: >60 mL/min (ref >60)
Glucose, Bld: 244 mg/dL — ABNORMAL HIGH (ref 70–99)
Potassium: 4 mmol/L (ref 3.5–5.1)
Sodium: 133 mmol/L — ABNORMAL LOW (ref 135–145)
Total Bilirubin: 0.7 mg/dL (ref 0.3–1.2)
Total Protein: 5.6 g/dL — ABNORMAL LOW (ref 6.5–8.1)

## 2021-08-12 LAB — CBC WITH DIFFERENTIAL/PLATELET
Abs Immature Granulocytes: 0.25 10*3/uL — ABNORMAL HIGH (ref 0.00–0.07)
Basophils Absolute: 0 10*3/uL (ref 0.0–0.1)
Basophils Relative: 1 %
Eosinophils Absolute: 0 10*3/uL (ref 0.0–0.5)
Eosinophils Relative: 1 %
HCT: 23.1 % — ABNORMAL LOW (ref 39.0–52.0)
Hemoglobin: 7.7 g/dL — ABNORMAL LOW (ref 13.0–17.0)
Immature Granulocytes: 7 %
Lymphocytes Relative: 11 %
Lymphs Abs: 0.4 10*3/uL — ABNORMAL LOW (ref 0.7–4.0)
MCH: 31 pg (ref 26.0–34.0)
MCHC: 33.3 g/dL (ref 30.0–36.0)
MCV: 93.1 fL (ref 80.0–100.0)
Monocytes Absolute: 0.4 10*3/uL (ref 0.1–1.0)
Monocytes Relative: 11 %
Neutro Abs: 2.6 10*3/uL (ref 1.7–7.7)
Neutrophils Relative %: 69 %
Platelets: 24 10*3/uL — CL (ref 150–400)
RBC: 2.48 MIL/uL — ABNORMAL LOW (ref 4.22–5.81)
RDW: 15.5 % (ref 11.5–15.5)
WBC: 3.7 10*3/uL — ABNORMAL LOW (ref 4.0–10.5)
nRBC: 2.4 % — ABNORMAL HIGH (ref 0.0–0.2)

## 2021-08-12 LAB — PREPARE RBC (CROSSMATCH)

## 2021-08-12 LAB — MAGNESIUM: Magnesium: 1.6 mg/dL — ABNORMAL LOW (ref 1.7–2.4)

## 2021-08-12 MED ORDER — SODIUM CHLORIDE 0.9% FLUSH
10.0000 mL | INTRAVENOUS | Status: AC | PRN
  Administered 2021-08-12: 10 mL

## 2021-08-12 MED ORDER — HEPARIN SOD (PORK) LOCK FLUSH 100 UNIT/ML IV SOLN
500.0000 [IU] | Freq: Every day | INTRAVENOUS | Status: AC | PRN
  Administered 2021-08-12: 500 [IU]

## 2021-08-12 MED ORDER — ACETAMINOPHEN 325 MG PO TABS
650.0000 mg | ORAL_TABLET | Freq: Once | ORAL | Status: AC
  Administered 2021-08-12: 650 mg via ORAL
  Filled 2021-08-12: qty 2

## 2021-08-12 MED ORDER — SODIUM CHLORIDE 0.9 % IV SOLN: Freq: Once | INTRAVENOUS | Status: AC

## 2021-08-12 MED ORDER — DIPHENHYDRAMINE HCL 25 MG PO CAPS
25.0000 mg | ORAL_CAPSULE | Freq: Once | ORAL | Status: AC
  Administered 2021-08-12: 25 mg via ORAL
  Filled 2021-08-12: qty 1

## 2021-08-12 MED ORDER — OXYCODONE HCL 5 MG PO TABS
10.0000 mg | ORAL_TABLET | Freq: Once | ORAL | Status: AC
  Administered 2021-08-12: 10 mg via ORAL
  Filled 2021-08-12: qty 2

## 2021-08-12 MED ORDER — SODIUM CHLORIDE 0.9% IV SOLUTION
250.0000 mL | Freq: Once | INTRAVENOUS | Status: AC
  Administered 2021-08-12: 250 mL via INTRAVENOUS

## 2021-08-12 NOTE — Progress Notes (Signed)
CRITICAL VALUE ALERT ?Critical value received:  Platelets 24. ?Date of notification:  08/12/2021  ?Time of notification: 08:34 am  ?Critical value read back:  Yes.   ?Nurse who received alert:  B. Goldia Ligman RN ?MD notified time and response:  Dr. Delton Coombes / A. Beckie Salts.  Infuse 2 units of blood per Dr. Delton Coombes orders.  ?  ?

## 2021-08-12 NOTE — Progress Notes (Signed)
[facility] 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 Radical resection of prostate on 06/27/2014. 2. Zytiga from 05/19/2018 to 11/09/2019. 3. Xtandi from 11/21/2019 to 12/05/2019, stopped due to mood changes   NGS Results: Guardant AR amplification, MSI normal, TMB 16 Muts/Mb  CURRENT THERAPY: Docetaxel every 3 weeks; Xgeva monthly; Lupron every 3 months  BRIEF ONCOLOGIC HISTORY:  Oncology History  Prostate cancer (HCC)  02/06/2014 Procedure   Prostate biopsy by Dr. Wendall Stade   02/11/2014 Pathology Results   Prostatic adenocarcinoma identified in 5 of 6 prostate specimens with Gleason pattern showing primary pattern-grade 4, secondary pattern grade 3.  Total Gleason score equals 7.  Proportion of prostatic tissue involved by tumor: 70%.   05/03/2014 Imaging   Bone scan- Negative     06/27/2014 Procedure   Radical resection of prostate by Dr. Heloise Purpura   07/29/2014 Pathology Results   1. Prostate, radical resection - PROSTATIC ADENOCARCINOMA, GLEASON SCORE 4 + 3 = 7. - RIGHT AND LEFT PROSTATE INVOLVED. - RIGHT AND LEFT SEMINAL VESICLES INVOLVED BY TUMOR. - EXTRACAPSULAR EXTENSION BY TUMOR. - TUMOR EXTENDS INTO BLADDER NECK TISSUE. - MARGINS NOT INVOLVED. 2. Lymph nodes, regional resection, left pelvic - THREE BENIGN LYMPH NODES (0/3). 3. Lymph nodes, regional resection, right pelvic - FOUR BENIGN LYMPH NODES (0/4).   11/16/2016 Imaging   Bone scan- New areas of increased uptake superolateral to the left orbit (faint), at S1, and in the posterolateral aspect of the left sixth rib.    11/25/2016 Imaging   CT CAP- 1. Status post radical prostatectomy. No findings to suggest local recurrence of disease or definite extraskeletal metastatic disease in the  chest, abdomen or pelvis. However, there are several osseous lesions, as above, concerning for metastatic disease to the bones. 2. Hepatic steatosis. 3. Aortic atherosclerosis, in addition to 2 vessel coronary artery disease. Please note that although the presence of coronary artery calcium documents the presence of coronary artery disease, the severity of this disease and any potential stenosis cannot be assessed on this non-gated CT examination. Assessment for potential risk factor modification, dietary therapy or pharmacologic therapy may be warranted, if clinically indicated. 4. Additional incidental findings, as above.    Genetic Testing   Guardant 360 Results:         06/23/2020 - 10/08/2020 Chemotherapy   Patient is on Treatment Plan : PROSTATE Docetaxel + Prednisone q21d     04/30/2021 -  Chemotherapy   Patient is on Treatment Plan : PROSTATE Cabazitaxel + Prednisone q21d       CANCER STAGING: Cancer Staging  Prostate cancer [facility]) Staging form: Prostate, AJCC 7th Edition - Pathologic stage from 07/29/2014: Stage III (T3b, N0, cM0, Gleason 7) - Signed by Ellouise Newer, PA-C on 11/09/2016 - Clinical: Stage IV (T3, N0, M1, PSA: Less than 10, Gleason 7) - Signed by Ralene Cork, MD on 12/21/2016 - Pathologic: No stage assigned - Unsigned   INTERVAL HISTORY:  [name], a 66 y.o. 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 Hunter Jones was last seen on 07/20/2021.   Today he reports feeling fair, and he is accompanied by his wife. He had 1 fall 2 weeks ago when getting out of bed, and he now  reports a bruise and lump on his left buttock as well as bruises on his left arm. He reports fatigue, and his wife reports he has required assistance getting out of bed and dressing himself. His wife reports he is frequently cold. He continues to have anxiety for which he is taking Celexa which helps. He has been taking ibuprofen every 4-6 hours prn.    REVIEW OF SYSTEMS:  Review of Systems  Constitutional:  Positive for appetite change and fatigue.  HENT:   Positive for trouble swallowing.   Respiratory:  Positive for shortness of breath.   Gastrointestinal:  Positive for constipation.  Genitourinary:  Positive for frequency.   Musculoskeletal:  Positive for arthralgias (10/10).  Neurological:  Positive for dizziness, headaches and numbness.  Psychiatric/Behavioral:  Positive for depression and sleep disturbance. The patient is nervous/anxious.   All other systems reviewed and are negative.  PAST MEDICAL/SURGICAL HISTORY:  Past Medical History:  Diagnosis Date   Acute ST elevation myocardial infarction (STEMI) of inferior wall (HCC) 11/2019   Asthma    COPD (chronic obstructive pulmonary disease) (HCC)    Coronary artery disease    DES to mid LAD August 2014 ([facility]); overlapping DESx2 prox-mid RCA 11/2019   Essential hypertension    Hyperthyroidism    NSTEMI (non-ST elevated myocardial infarction) (HCC) 2014   Port-A-Cath in place 06/17/2020   Prostate cancer (HCC)    Type 2 diabetes mellitus (HCC)    Past Surgical History:  Procedure Laterality Date   APPENDECTOMY     CORONARY/GRAFT ACUTE MI REVASCULARIZATION N/A 12/10/2019   Procedure: Coronary/Graft Acute MI Revascularization;  Surgeon: Yvonne Kendall, MD;  Location: [facility] INVASIVE [facility];  Service: Cardiovascular;  Laterality: N/A;   IR IMAGING GUIDED PORT INSERTION  06/19/2020   LEFT HEART CATH AND CORONARY ANGIOGRAPHY N/A 12/10/2019   Procedure: LEFT HEART CATH AND CORONARY ANGIOGRAPHY;  Surgeon: Yvonne Kendall, MD;  Location: [facility] INVASIVE CV [facility];  Service: Cardiovascular;  Laterality: N/A;   LYMPHADENECTOMY Bilateral 06/27/2014   Procedure: LYMPHADENECTOMY;  Surgeon: Heloise Purpura, MD;  Location: [facility];  Service: Urology;  Laterality: Bilateral;   ROBOT ASSISTED LAPAROSCOPIC RADICAL PROSTATECTOMY N/A 06/27/2014   Procedure: ROBOTIC ASSISTED LAPAROSCOPIC RADICAL  PROSTATECTOMY LEVEL 3;  Surgeon: Heloise Purpura, MD;  Location: [facility];  Service: Urology;  Laterality: N/A;   TONSILLECTOMY      SOCIAL HISTORY:  Social History   Socioeconomic History   Marital status: Married    Spouse name: Not on file   Number of children: 0   Years of education: Not on file   Highest education level: Not on file  Occupational History   Occupation: truck driver    Comment: retired  Tobacco Use   Smoking status: Former    Packs/day: 1.00    Years: 40.00    Pack years: 40.00    Types: Cigarettes    Quit date: 06/21/2012    Years since quitting: 9.1   Smokeless tobacco: Never  Vaping Use   Vaping Use: Never used  Substance and Sexual Activity   Alcohol use: Yes    Comment: occasional   Drug use: No   Sexual activity: Not Currently  Other Topics Concern   Not on file  Social History Narrative   Not on file   Social Determinants of Health   Financial Resource Strain: Not on file  Food Insecurity: Not on file  Transportation Needs: Not on file  Physical Activity: Not on file  Stress: Not on file  Social Connections: Not on file  Intimate Partner Violence: Not on file    FAMILY HISTORY:  Family History  Problem Relation Age of Onset   Dementia Mother    Thyroid disease Mother    Clotting disorder Mother    Kidney Stones Father    Stroke Brother    Alzheimer's disease Maternal Uncle    Tuberculosis Paternal Grandmother    Heart attack Maternal Uncle        x4   Heart attack Cousin        in his 29s   Cancer Neg Hx     CURRENT MEDICATIONS:  Current Outpatient Medications  Medication Sig Dispense Refill   ADVAIR DISKUS 250-50 MCG/ACT AEPB Inhale 1 puff into the lungs 2 (two) times daily.     albuterol (PROVENTIL HFA;VENTOLIN HFA) 108 (90 BASE) MCG/ACT inhaler Inhale 1-2 puffs into the lungs every 6 (six) hours as needed for wheezing or shortness of breath.     ALPRAZolam (XANAX) 0.5 MG tablet Take 1 tablet (0.5 mg total) by mouth every 8  (eight) hours as needed for anxiety. 20 tablet 0   Ascorbic Acid (VITAMIN C PO) Take 1 tablet by mouth daily.     aspirin EC 81 MG tablet Take 81 mg by mouth 3 (three) times a week.     atorvastatin (LIPITOR) 40 MG tablet TAKE 1 TABLET BY MOUTH AT BEDTIME. (Patient taking differently: Take 40 mg by mouth at bedtime.) 30 tablet 6   BREO ELLIPTA 200-25 MCG/ACT AEPB Inhale 1 puff into the lungs daily.     Calcium Carb-Cholecalciferol (HM CALCIUM-VITAMIN D) 600-800 MG-UNIT TABS Take 1 tablet by mouth 2 (two) times daily. 1800 mg per day     citalopram (CELEXA) 10 MG tablet Take 1 tablet (10 mg total) by mouth daily. 30 tablet 3   Coenzyme Q10 (COQ10) 200 MG CAPS Take 200 mg by mouth daily.     denosumab (XGEVA) 120 MG/1.7ML SOLN injection Inject 120 mg into the skin every 30 (thirty) days. Last received 11/07/20     fluticasone-salmeterol (ADVAIR HFA) 115-21 MCG/ACT inhaler Inhale 2 puffs into the lungs 2 (two) times daily.      Ibuprofen-diphenhydrAMINE HCl (IBUPROFEN PM) 200-25 MG CAPS Take 1 tablet by mouth at bedtime as needed (sleep).     KRILL OIL PO Take 1 capsule by mouth daily.     Leuprolide Acetate, 3 Month, (LUPRON DEPOT, 3-MONTH, IM) Inject 1 Dose into the muscle every 3 (three) months.     lidocaine-prilocaine (EMLA) cream Apply to affected area once 30 g 3   Lutein 20 MG CAPS Take 20 mg by mouth daily.     methimazole (TAPAZOLE) 10 MG tablet Take 10 mg by mouth every other day.      methocarbamol (ROBAXIN) 750 MG tablet Take 750 mg by mouth at bedtime as needed.     metoprolol succinate (TOPROL-XL) 25 MG 24 hr tablet Take 1 tablet (25 mg total) by mouth every other day. 45 tablet 3   Multiple Vitamin (MULTIVITAMIN) tablet Take 1 tablet by mouth daily.     nitroGLYCERIN (NITROSTAT) 0.4 MG SL tablet Place 1 tablet (0.4 mg total) under the tongue every 5 (five) minutes as needed. 25 tablet 12   Oxycodone HCl 10 MG TABS Take 1 tablet (10 mg total) by mouth every 8 (eight) hours. 90 tablet 0    Potassium 99 MG TABS Take 99 mg by mouth daily.     predniSONE (DELTASONE) 10 MG  tablet Take 1 tablet (10 mg total) by mouth daily. 21 tablet 3   predniSONE (DELTASONE) 5 MG tablet TAKE 1 TABLET BY MOUTH EVERY DAY WITH BREAKFAST 60 tablet 11   prochlorperazine (COMPAZINE) 10 MG tablet Take 1 tablet (10 mg total) by mouth every 6 (six) hours as needed (Nausea or vomiting). 30 tablet 1   Trolamine Salicylate (ASPERCREME EX) Apply 1 application topically daily as needed (pain).     No current facility-administered medications for this visit.    ALLERGIES:  Allergies  Allergen Reactions   Xtandi [Enzalutamide] Other (See Comments)    "Severe personality change" and blood clots   Penicillins Hives    PHYSICAL EXAM:  Performance status (ECOG): 1 - Symptomatic but completely ambulatory  There were no vitals filed for this visit. Wt Readings from Last 3 Encounters:  08/12/21 217 lb 12.8 oz (98.8 kg)  08/03/21 222 lb 3.2 oz (100.8 kg)  07/20/21 224 lb 14.4 oz (102 kg)   Physical Exam Vitals reviewed.  Constitutional:      Appearance: Normal appearance. He is obese.  Cardiovascular:     Rate and Rhythm: Normal rate and regular rhythm.     Pulses: Normal pulses.     Heart sounds: Normal heart sounds.  Pulmonary:     Effort: Pulmonary effort is normal.     Breath sounds: Normal breath sounds.  Neurological:     General: No focal deficit present.     Mental Status: He is alert and oriented to person, place, and time.  Psychiatric:        Mood and Affect: Mood normal.        Behavior: Behavior normal.     LABORATORY DATA:  I have reviewed the labs as listed.  CBC Latest Ref Rng & Units 08/03/2021 07/29/2021 07/27/2021  WBC 4.0 - 10.5 K/uL 4.0 5.2 4.3  Hemoglobin 13.0 - 17.0 g/dL 7.5(L) 9.4(L) 8.1(L)  Hematocrit 39.0 - 52.0 % 23.4(L) 28.3(L) 24.4(L)  Platelets 150 - 400 K/uL 26(LL) 32(L) 35(L)   CMP Latest Ref Rng & Units 08/03/2021 07/29/2021 07/27/2021  Glucose 70 - 99 mg/dL  252(H) 158(H) 243(H)  BUN 8 - 23 mg/dL 31(H) 26(H) 31(H)  Creatinine 0.61 - 1.24 mg/dL 1.02 0.99 1.02  Sodium 135 - 145 mmol/L 131(L) 135 133(L)  Potassium 3.5 - 5.1 mmol/L 4.3 4.1 4.6  Chloride 98 - 111 mmol/L 101 106 104  CO2 22 - 32 mmol/L 19(L) 20(L) 19(L)  Calcium 8.9 - 10.3 mg/dL 7.7(L) 7.6(L) 7.5(L)  Total Protein 6.5 - 8.1 g/dL 5.3(L) 6.1(L) 5.5(L)  Total Bilirubin 0.3 - 1.2 mg/dL 0.9 0.9 1.0  Alkaline Phos 38 - 126 U/L 290(H) 345(H) 278(H)  AST 15 - 41 U/L 75(H) 60(H) 86(H)  ALT 0 - 44 U/L 111(H) 108(H) 160(H)    DIAGNOSTIC IMAGING:  I have independently reviewed the scans and discussed with the patient. NM Bone Scan Whole Body  Result Date: 07/15/2021 CLINICAL DATA:  Metastatic disease evaluation. History of metastatic prostate cancer. EXAM: NUCLEAR MEDICINE WHOLE BODY BONE SCAN TECHNIQUE: Whole body anterior and posterior images were obtained approximately 3 hours after intravenous injection of radiopharmaceutical. RADIOPHARMACEUTICALS:  19.5 mCi Technetium-65m MDP IV COMPARISON:  Bone scan 04/27/2021.  CT 04/24/2021. FINDINGS: Bilateral renal function and excretion noted. Multiple areas of increased activity again noted throughout the axial and appendicular skeleton. Increased activity is again noted over both shoulders, throughout the spine, throughout the ribs bilaterally, throughout the pelvis, and throughout both femurs and tibias. A  new punctate area of increased activity is noted over the mandible. This could represent a metastatic focus. IMPRESSION: 1. Persistent diffuse metastatic disease throughout the axial and appendicular skeleton. Similar findings noted on prior exam. 2. New punctate area of increased activity noted over the mandible. This could represent a new metastatic focus. Electronically Signed   By: Marcello Moores  Register M.D.   On: 07/15/2021 09:45   CT CHEST ABDOMEN PELVIS W CONTRAST  Result Date: 07/15/2021 CLINICAL DATA:  Follow-up prostate cancer EXAM: CT CHEST,  ABDOMEN, AND PELVIS WITH CONTRAST TECHNIQUE: Multidetector CT imaging of the chest, abdomen and pelvis was performed following the standard protocol during bolus administration of intravenous contrast. RADIATION DOSE REDUCTION: This exam was performed according to the departmental dose-optimization program which includes automated exposure control, adjustment of the mA and/or kV according to patient size and/or use of iterative reconstruction technique. CONTRAST:  135mL OMNIPAQUE IOHEXOL 300 MG/ML  SOLN COMPARISON:  CT abdomen and pelvis dated April 24, 2021; CT chest, abdomen and pelvis dated June 02, 2020 FINDINGS: CT CHEST FINDINGS Cardiovascular: Normal heart size. No pericardial effusion. Left main and RCA coronary artery calcifications. Atherosclerotic disease of the thoracic aorta. Right chest wall port with tip near the superior cavoatrial junction. Mediastinum/Nodes: Esophagus and thyroid are unremarkable. Enlarged left hilar lymph node measuring 1.2 cm in short axis on series 2, image 38. Enlarged subcarinal lymph node measuring 1.1 cm on image 34. Lungs/Pleura: Central airways are patent. No consolidation, pleural effusion or pneumothorax. Mild bibasilar atelectasis. Musculoskeletal: Diffuse sclerotic osseous metastatic disease. CT ABDOMEN PELVIS FINDINGS Hepatobiliary: Heterogeneous lesion of the inferior right lobe of the liver measuring 5.9 x 5.2 cm on series 2, image 71, previously 4.2 x 3.1 cm smaller adjacent liver lesion in the inferior right hepatic lobe measuring 3.7 x 3.6 cm on image 74, previously measured 2.5 x 2.3 cm. Pancreas: Unremarkable. No pancreatic ductal dilatation or surrounding inflammatory changes. Spleen: Spleen is slightly increased in size measuring 13.0 cm, previously 10.5 cm. Adrenals/Urinary Tract: Bilateral adrenal glands are unremarkable. Kidneys enhance symmetrically with no evidence of hydronephrosis or nephrolithiasis. Bladder is unremarkable. Stomach/Bowel:  Stomach is within normal limits. Appendix appears normal. No evidence of bowel wall thickening, distention, or inflammatory changes. Vascular/Lymphatic: Aortic atherosclerosis. No enlarged abdominal or pelvic lymph nodes. Reproductive: Status post prostatectomy. Other: No abdominal wall hernia or abnormality. No abdominopelvic ascites. Musculoskeletal: Diffuse sclerotic osseous metastatic disease. IMPRESSION: 1. Interval increased size right hepatic lobe lesions, compatible with worsening liver metastatic disease. 2. Enlarged mediastinal and left hilar lymph nodes which are new when compared with prior chest CT dated June 02, 2020, concerning for metastatic disease. 3. Diffuse sclerotic osseous metastatic disease which is similar when compared with prior CT of the abdomen and pelvis. Correlate with same day bone scan. 4.  Aortic Atherosclerosis (ICD10-I70.0). Electronically Signed   By: Yetta Glassman M.D.   On: 07/15/2021 14:04   US Venous Img Lower Unilateral Left  Result Date: 07/21/2021 CLINICAL DATA:  66 year old male with leg swelling EXAM: LEFT LOWER EXTREMITY VENOUS DOPPLER ULTRASOUND TECHNIQUE: Gray-scale sonography with graded compression, as well as color Doppler and duplex ultrasound were performed to evaluate the lower extremity deep venous systems from the level of the common femoral vein and including the common femoral, femoral, profunda femoral, popliteal and calf veins including the posterior tibial, peroneal and gastrocnemius veins when visible. The superficial great saphenous vein was also interrogated. Spectral Doppler was utilized to evaluate flow at rest and with distal augmentation maneuvers in  the common femoral, femoral and popliteal veins. COMPARISON:  None. FINDINGS: Contralateral Common Femoral Vein: Respiratory phasicity is normal and symmetric with the symptomatic side. No evidence of thrombus. Normal compressibility. Common Femoral Vein: No evidence of thrombus. Normal  compressibility, respiratory phasicity and response to augmentation. Saphenofemoral Junction: No evidence of thrombus. Normal compressibility and flow on color Doppler imaging. Profunda Femoral Vein: No evidence of thrombus. Normal compressibility and flow on color Doppler imaging. Femoral Vein: Wall thickening with incomplete compressibility in the proximal femoral vein. Flow maintained with no thrombus. Mid segment and distal segment unremarkable with flow maintained, normal augmentation. Popliteal Vein: No evidence of thrombus. Normal compressibility, respiratory phasicity and response to augmentation. Calf Veins: Peroneal vein incompletely visualized. Posterior tibial vein incompletely visualized. Anterior tibial veins patent. Superficial Great Saphenous Vein: No evidence of thrombus. Normal compressibility and flow on color Doppler imaging. Other Findings:  None. IMPRESSION: Directed duplex of the left lower extremity negative for DVT. Wall thickening in the proximal femoral vein most likely represents sequela of remote DVT, patency maintained. Electronically Signed   By: Corrie Mckusick D.O.   On: 07/21/2021 12:13     ASSESSMENT:  1.  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 -Abiraterone and prednisone from 06/16/2018 through 11/06/2019 with progression. -Xtandi started around 11/21/2019, discontinued on 12/05/2019 due to mood changes. -ST elevation MI involving the right coronary artery on 12/10/2019, status post cardiac catheterization showing thrombotic occlusion of the proximal RCA, successful PCI with stent placement. -[employer] testing was negative for germline mutations. -Guardant 360 showed AR amplification.  No other mutations. -Bicalutamide 50 mg daily from 04/02/2020 through 05/12/2020 with progression. -Bone scan on 06/02/2020 with increased activity throughout the sacrum with no other bony abnormalities. -CT CAP from 06/02/2020  with no visceral metastatic disease. -[place] [place] is not compatible with Brilinta. -6 cycles of docetaxel from 06/23/2020 through 10/05/2020. - 5 treatments of radium-223 from 12/09/2020 through 04/13/2021. - CT scan and bone scan showed progressive disease with 2 hypodense liver lesions. - Cabazitaxel cycle 1 started on 04/30/2021   PLAN:  1.  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 - Bone scan on 07/15/2021 showed diffuse metastatic disease throughout axial and appendicular system with new punctate area in the mandible. - CT CAP on 07/14/2021 showed liver lesions which have increased in size.  Splenomegaly.  Enlarged left hilar lymph node and subcarinal lymph node. - We have reviewed bone marrow biopsy results from 07/29/2021 which showed metastatic carcinoma consistent with prostatic primary. - Last PSA was 1833. - Reviewed CBC today which shows platelet count 24 and white count 3.7 with normal ANC.  Hemoglobin is 7.7.  LFTs are elevated. - He is completely transfusion dependent.  His platelet count is also worsening.  He is not a candidate for any aggressive therapy.  Continuing chemotherapy would most likely harm him at this time. - I have reviewed literature.  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  We will send NGS testing on the bone marrow biopsy if adequate cells. - We will continue transfusion support by checking his labs weekly. - We have talked about referring him to hospice.  We have also made a referral to home health for immediate help with his day-to-day activities.    2.  Bone metastasis: - Continue calcium supplements.  We are holding off on Xgeva.   3.  Peripheral neuropathy: -  Ultrasound of the left leg was negative for DVT.  He has some neuropathy in the feet stable.   4.  CAD and stents: - Hold aspirin and Lipitor.  Discontinue metoprolol as his blood pressure is low.  5.   Body pains: - I have recommended stopping ibuprofen. - He is taking oxycodone 10 mg daily.  Increase oxycodone 10 mg every 8 hours as needed.  6.  Anxiety/depression: - Continue Celexa 10 mg daily which is helping. - Continue Xanax 0.5 mg every 8 hours as needed.   Orders placed this encounter:  No orders of the defined types were placed in this encounter.    Derek Jack, MD [place] [phone]   I, Thana Ates, am acting as a scribe for Dr. Derek Jack.  I, Derek Jack MD, have reviewed the above documentation for accuracy and completeness, and I agree with the above.

## 2021-08-12 NOTE — Progress Notes (Signed)
Script sent to [employer] for [facility], [employer] chair and [employer].  Order placed for [facility] nurse and Aide.  Romualdo Bolk aware of referral. ?

## 2021-08-12 NOTE — Patient Instructions (Addendum)
[place] at [facility] ?Discharge Instructions ? ? ?You were seen and examined today by Dr. Delton Coombes. ? ?He reviewed the results of your bone marrow biopsy. 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  ? ?He discussed treatment options with you, which are limited.  Chemotherapy is not an option due to your blood counts being so low.  ? ?We will continue to check your blood and give transfusions as needed.  ? ?Hold your metoprolol at home if your top number of your blood pressure is 90 or less.  You can also completely discontinue Lipitor.  ? ?HOLD ibuprofen. ? ? ? ? ?Thank you for choosing [place] at [facility] to provide your oncology and hematology care.  To afford each patient quality time with our provider, please arrive at least 15 minutes before your scheduled appointment time.  ? ?If you have a lab appointment with the [place] please come in thru the Main Entrance and check in at the main information desk. ? ?You need to re-schedule your appointment should you arrive 10 or more minutes late.  We strive to give you quality time with our providers, and arriving late affects you and other patients whose appointments are after yours.  Also, if you no show three or more times for appointments you may be dismissed from the clinic at the providers discretion.     ?Again, thank you for choosing [facility].  Our hope is that these requests will decrease the amount of time that you wait before being seen by our physicians.       ?_____________________________________________________________ ? ?Should you have questions after your visit to [facility], please contact our office at [phone] and follow the prompts.  Our office hours are 8:00 a.m. and 4:30 p.m. Monday - Friday.  Please note that voicemails left after 4:00 p.m. may not be returned until the following business day.  We are closed weekends and major  holidays.  You do have access to a nurse 24-7, just call the main number to the clinic [phone] and do not press any options, hold on the line and a nurse will answer the phone.   ? ?For prescription refill requests, have your pharmacy contact our office and allow 72 hours.   ? ?Due to Covid, you will need to wear a mask upon entering the hospital. If you do not have a mask, a mask will be given to you at the Main Entrance upon arrival. For doctor visits, patients may have 1 support person age 71 or older with them. For treatment visits, patients can not have anyone with them due to social distancing guidelines and our immunocompromised population.  ? ?   ?

## 2021-08-12 NOTE — Telephone Encounter (Signed)
Spoke to wife regarding DME. Per [employer], the only item that is covered by his plan is the bedside commode.  As for the shower chair and grab bars, I gave her the number for [employer], in hopes they would have what she needs.  She has a friend that can install grab bars. ?

## 2021-08-12 NOTE — Patient Instructions (Signed)
[facility]  Discharge Instructions: Thank you for choosing [facility] to provide your oncology and hematology care.  If you have a lab appointment with the Cancer Center, please come in thru the Main Entrance and check in at the main information desk.  Wear comfortable clothing and clothing appropriate for easy access to any Portacath or PICC line.   We strive to give you quality time with your provider. You may need to reschedule your appointment if you arrive late (15 or more minutes).  Arriving late affects you and other patients whose appointments are after yours.  Also, if you miss three or more appointments without notifying the office, you may be dismissed from the clinic at the provider's discretion.      For prescription refill requests, have your pharmacy contact our office and allow 72 hours for refills to be completed.        To help prevent nausea and vomiting after your treatment, we encourage you to take your nausea medication as directed.  BELOW ARE SYMPTOMS THAT SHOULD BE REPORTED IMMEDIATELY: *FEVER GREATER THAN 100.4 F (38 C) OR HIGHER *CHILLS OR SWEATING *NAUSEA AND VOMITING THAT IS NOT CONTROLLED WITH YOUR NAUSEA MEDICATION *UNUSUAL SHORTNESS OF BREATH *UNUSUAL BRUISING OR BLEEDING *URINARY PROBLEMS (pain or burning when urinating, or frequent urination) *BOWEL PROBLEMS (unusual diarrhea, constipation, pain near the anus) TENDERNESS IN MOUTH AND THROAT WITH OR WITHOUT PRESENCE OF ULCERS (sore throat, sores in mouth, or a toothache) UNUSUAL RASH, SWELLING OR PAIN  UNUSUAL VAGINAL DISCHARGE OR ITCHING   Items with * indicate a potential emergency and should be followed up as soon as possible or go to the Emergency Department if any problems should occur.  Please show the CHEMOTHERAPY ALERT CARD or IMMUNOTHERAPY ALERT CARD at check-in to the Emergency Department and triage nurse.  Should you have questions after your visit or need to cancel  or reschedule your appointment, please contact [facility] [phone]  and follow the prompts.  Office hours are 8:00 a.m. to 4:30 p.m. Monday - Friday. Please note that voicemails left after 4:00 p.m. may not be returned until the following business day.  We are closed weekends and major holidays. You have access to a nurse at all times for urgent questions. Please call the main number to the clinic [phone] and follow the prompts.  For any non-urgent questions, you may also contact your provider using MyChart. We now offer e-Visits for anyone 18 and older to request care online for non-urgent symptoms. For details visit [email or web address].   Also download the MyChart app! Go to the app store, search "MyChart", open the app, select [place], and log in with your MyChart username and password.  Due to Covid, a mask is required upon entering the hospital/clinic. If you do not have a mask, one will be given to you upon arrival. For doctor visits, patients may have 1 support person aged 18 or older with them. For treatment visits, patients cannot have anyone with them due to current Covid guidelines and our immunocompromised population.  

## 2021-08-12 NOTE — Progress Notes (Signed)
Patient presents today for blood transfusion.  Patient is in stable condition with no new complaints voiced.  Vital signs are stable.  Labs reviewed by Dr. Delton Coombes during his office visit.  Hemoglobin today is 7.7 and platelets are 24.  We will give two units per Dr. Delton Coombes.  Patient will also receive NS 500 mL over one hour for hypotension per Dr. Delton Coombes.  Pain today is 10/10 per patient so we will also give Oxycodone 10 mg PO x one dose today per Dr. Delton Coombes.  We will proceed with transfusions per MD orders.  ? ?Patient tolerated transfusions well with no complaints voiced.  Patient left via wheelchair in stable condition.  Vital signs stable at discharge.  Follow up as scheduled.    ?

## 2021-08-13 LAB — TYPE AND SCREEN
ABO/RH(D): O POS
Antibody Screen: NEGATIVE
Unit division: 0
Unit division: 0

## 2021-08-13 LAB — BPAM RBC
Blood Product Expiration Date: 202304052359
Blood Product Expiration Date: 202304052359
ISSUE DATE / TIME: 202303011011
ISSUE DATE / TIME: 202303011209
Unit Type and Rh: 5100
Unit Type and Rh: 5100

## 2021-08-19 ENCOUNTER — Inpatient Hospital Stay (HOSPITAL_COMMUNITY): Payer: Medicare Other

## 2021-08-19 ENCOUNTER — Encounter (HOSPITAL_COMMUNITY): Payer: Self-pay

## 2021-08-19 ENCOUNTER — Other Ambulatory Visit: Payer: Self-pay

## 2021-08-19 VITALS — BP 109/71 | HR 82 | Temp 97.8°F | Resp 20

## 2021-08-19 DIAGNOSIS — C61 Malignant neoplasm of prostate: Secondary | ICD-10-CM

## 2021-08-19 DIAGNOSIS — D649 Anemia, unspecified: Secondary | ICD-10-CM | POA: Diagnosis not present

## 2021-08-19 DIAGNOSIS — Z95828 Presence of other vascular implants and grafts: Secondary | ICD-10-CM

## 2021-08-19 LAB — CBC WITH DIFFERENTIAL/PLATELET
Band Neutrophils: 5 %
Basophils Absolute: 0 10*3/uL (ref 0.0–0.1)
Basophils Relative: 0 %
Eosinophils Absolute: 0 10*3/uL (ref 0.0–0.5)
Eosinophils Relative: 1 %
HCT: 24.4 % — ABNORMAL LOW (ref 39.0–52.0)
Hemoglobin: 7.8 g/dL — ABNORMAL LOW (ref 13.0–17.0)
Lymphocytes Relative: 11 %
Lymphs Abs: 0.5 10*3/uL — ABNORMAL LOW (ref 0.7–4.0)
MCH: 29.9 pg (ref 26.0–34.0)
MCHC: 32 g/dL (ref 30.0–36.0)
MCV: 93.5 fL (ref 80.0–100.0)
Metamyelocytes Relative: 2 %
Monocytes Absolute: 0.2 10*3/uL (ref 0.1–1.0)
Monocytes Relative: 5 %
Myelocytes: 2 %
Neutro Abs: 3.4 10*3/uL (ref 1.7–7.7)
Neutrophils Relative %: 74 %
Platelets: 19 10*3/uL — CL (ref 150–400)
RBC: 2.61 MIL/uL — ABNORMAL LOW (ref 4.22–5.81)
RDW: 15.8 % — ABNORMAL HIGH (ref 11.5–15.5)
WBC: 4.3 10*3/uL (ref 4.0–10.5)
nRBC: 2.8 % — ABNORMAL HIGH (ref 0.0–0.2)

## 2021-08-19 LAB — PREPARE RBC (CROSSMATCH)

## 2021-08-19 MED ORDER — SODIUM CHLORIDE 0.9% IV SOLUTION
250.0000 mL | Freq: Once | INTRAVENOUS | Status: AC
  Administered 2021-08-19: 250 mL via INTRAVENOUS

## 2021-08-19 MED ORDER — HEPARIN SOD (PORK) LOCK FLUSH 100 UNIT/ML IV SOLN
500.0000 [IU] | Freq: Every day | INTRAVENOUS | Status: AC | PRN
  Administered 2021-08-19: 500 [IU]

## 2021-08-19 MED ORDER — DIPHENHYDRAMINE HCL 25 MG PO CAPS
25.0000 mg | ORAL_CAPSULE | Freq: Once | ORAL | Status: AC
  Administered 2021-08-19: 25 mg via ORAL
  Filled 2021-08-19: qty 1

## 2021-08-19 MED ORDER — SODIUM CHLORIDE 0.9% FLUSH
10.0000 mL | INTRAVENOUS | Status: AC | PRN
  Administered 2021-08-19: 10 mL

## 2021-08-19 MED ORDER — ACETAMINOPHEN 325 MG PO TABS
650.0000 mg | ORAL_TABLET | Freq: Once | ORAL | Status: AC
  Administered 2021-08-19: 650 mg via ORAL
  Filled 2021-08-19: qty 2

## 2021-08-19 NOTE — Patient Instructions (Signed)
[place]  Discharge Instructions: ?Thank you for choosing [place] to provide your oncology and hematology care.  ?If you have a lab appointment with the [facility], please come in thru the Main Entrance and check in at the main information desk. ? ?Wear comfortable clothing and clothing appropriate for easy access to any Portacath or PICC line.  ? ?We strive to give you quality time with your provider. You may need to reschedule your appointment if you arrive late (15 or more minutes).  Arriving late affects you and other patients whose appointments are after yours.  Also, if you miss three or more appointments without notifying the office, you may be dismissed from the clinic at the provider?s discretion.    ?  ?For prescription refill requests, have your pharmacy contact our office and allow 72 hours for refills to be completed.   ? ?Today you received the following 1 unit of PRBCs and 1 unit of Platelets, return as scheduled. ?  ?To help prevent nausea and vomiting after your treatment, we encourage you to take your nausea medication as directed. ? ?BELOW ARE SYMPTOMS THAT SHOULD BE REPORTED IMMEDIATELY: ?*FEVER GREATER THAN 100.4 F (38 ?C) OR HIGHER ?*CHILLS OR SWEATING ?*NAUSEA AND VOMITING THAT IS NOT CONTROLLED WITH YOUR NAUSEA MEDICATION ?*UNUSUAL SHORTNESS OF BREATH ?*UNUSUAL BRUISING OR BLEEDING ?*URINARY PROBLEMS (pain or burning when urinating, or frequent urination) ?*BOWEL PROBLEMS (unusual diarrhea, constipation, pain near the anus) ?TENDERNESS IN MOUTH AND THROAT WITH OR WITHOUT PRESENCE OF ULCERS (sore throat, sores in mouth, or a toothache) ?UNUSUAL RASH, SWELLING OR PAIN  ?UNUSUAL VAGINAL DISCHARGE OR ITCHING  ? ?Items with * indicate a potential emergency and should be followed up as soon as possible or go to the Emergency Department if any problems should occur. ? ?Please show the CHEMOTHERAPY ALERT CARD or IMMUNOTHERAPY ALERT CARD at check-in to the  Emergency Department and triage nurse. ? ?Should you have questions after your visit or need to cancel or reschedule your appointment, please contact [facility] [phone]  and follow the prompts.  Office hours are 8:00 a.m. to 4:30 p.m. Monday - Friday. Please note that voicemails left after 4:00 p.m. may not be returned until the following business day.  We are closed weekends and major holidays. You have access to a nurse at all times for urgent questions. Please call the main number to the clinic [phone] and follow the prompts. ? ?For any non-urgent questions, you may also contact your provider using MyChart. We now offer e-Visits for anyone 22 and older to request care online for non-urgent symptoms. For details visit [email or web address]. ?  ?Also download the MyChart app! Go to the app store, search "MyChart", open the app, select [place], and log in with your MyChart username and password. ? ?Due to Covid, a mask is required upon entering the hospital/clinic. If you do not have a mask, one will be given to you upon arrival. For doctor visits, patients may have 1 support person aged 22 or older with them. For treatment visits, patients cannot have anyone with them due to current Covid guidelines and our immunocompromised population.  ?

## 2021-08-19 NOTE — Progress Notes (Signed)
CRITICAL VALUE ALERT ?Critical value received:  platelets 19, hgb 7.8 ?Date of notification:  08-19-21 ?Time of notification: 0921 ?Critical value read back:  Yes.   ?Nurse who received alert:  C. Benuel Ly RN ?MD notified time and response:  [phone]. Will give one unit of platelets and one unit of blood.   ?

## 2021-08-19 NOTE — Progress Notes (Signed)
Patient tolerated 1 unit of PRBCs and 1 unit of platelets with no complaints voiced. Side effects with management reviewed with understanding verbalized. Port site clean and dry with no bruising or swelling noted at site. Good blood return noted before and after administration of therapy. Band aid applied. Patient left in satisfactory condition with VSS and no s/s of distress noted.  ?

## 2021-08-20 LAB — PREPARE PLATELET PHERESIS: Unit division: 0

## 2021-08-20 LAB — BPAM RBC
Blood Product Expiration Date: 202304092359
ISSUE DATE / TIME: 202303081015
Unit Type and Rh: 5100

## 2021-08-20 LAB — BPAM PLATELET PHERESIS
Blood Product Expiration Date: 202303102359
ISSUE DATE / TIME: 202303081156
Unit Type and Rh: 5100

## 2021-08-20 LAB — TYPE AND SCREEN
ABO/RH(D): O POS
Antibody Screen: NEGATIVE
Unit division: 0

## 2021-08-20 LAB — SURGICAL PATHOLOGY

## 2021-08-26 ENCOUNTER — Inpatient Hospital Stay (HOSPITAL_COMMUNITY): Payer: Medicare Other

## 2021-08-26 ENCOUNTER — Other Ambulatory Visit: Payer: Self-pay

## 2021-08-26 VITALS — BP 100/58 | HR 98 | Temp 97.7°F | Resp 18

## 2021-08-26 DIAGNOSIS — D649 Anemia, unspecified: Secondary | ICD-10-CM | POA: Diagnosis not present

## 2021-08-26 DIAGNOSIS — C61 Malignant neoplasm of prostate: Secondary | ICD-10-CM

## 2021-08-26 DIAGNOSIS — Z95828 Presence of other vascular implants and grafts: Secondary | ICD-10-CM

## 2021-08-26 LAB — CBC WITH DIFFERENTIAL/PLATELET
Abs Immature Granulocytes: 0.26 10*3/uL — ABNORMAL HIGH (ref 0.00–0.07)
Basophils Absolute: 0 10*3/uL (ref 0.0–0.1)
Basophils Relative: 0 %
Eosinophils Absolute: 0 10*3/uL (ref 0.0–0.5)
Eosinophils Relative: 1 %
HCT: 25.2 % — ABNORMAL LOW (ref 39.0–52.0)
Hemoglobin: 8 g/dL — ABNORMAL LOW (ref 13.0–17.0)
Immature Granulocytes: 5 %
Lymphocytes Relative: 9 %
Lymphs Abs: 0.4 10*3/uL — ABNORMAL LOW (ref 0.7–4.0)
MCH: 29.5 pg (ref 26.0–34.0)
MCHC: 31.7 g/dL (ref 30.0–36.0)
MCV: 93 fL (ref 80.0–100.0)
Monocytes Absolute: 0.3 10*3/uL (ref 0.1–1.0)
Monocytes Relative: 7 %
Neutro Abs: 3.9 10*3/uL (ref 1.7–7.7)
Neutrophils Relative %: 78 %
Platelets: 21 10*3/uL — CL (ref 150–400)
RBC: 2.71 MIL/uL — ABNORMAL LOW (ref 4.22–5.81)
RDW: 16.4 % — ABNORMAL HIGH (ref 11.5–15.5)
WBC: 5 10*3/uL (ref 4.0–10.5)
nRBC: 2.4 % — ABNORMAL HIGH (ref 0.0–0.2)

## 2021-08-26 LAB — PREPARE RBC (CROSSMATCH)

## 2021-08-26 MED ORDER — HEPARIN SOD (PORK) LOCK FLUSH 100 UNIT/ML IV SOLN
500.0000 [IU] | Freq: Every day | INTRAVENOUS | Status: AC | PRN
  Administered 2021-08-26: 500 [IU]

## 2021-08-26 MED ORDER — SODIUM CHLORIDE 0.9% IV SOLUTION
250.0000 mL | Freq: Once | INTRAVENOUS | Status: AC
  Administered 2021-08-26: 250 mL via INTRAVENOUS

## 2021-08-26 MED ORDER — DIPHENHYDRAMINE HCL 25 MG PO CAPS
25.0000 mg | ORAL_CAPSULE | Freq: Once | ORAL | Status: AC
  Administered 2021-08-26: 25 mg via ORAL
  Filled 2021-08-26: qty 1

## 2021-08-26 MED ORDER — SODIUM CHLORIDE 0.9% FLUSH
10.0000 mL | INTRAVENOUS | Status: AC | PRN
  Administered 2021-08-26: 10 mL

## 2021-08-26 MED ORDER — ACETAMINOPHEN 325 MG PO TABS
650.0000 mg | ORAL_TABLET | Freq: Once | ORAL | Status: AC
  Administered 2021-08-26: 650 mg via ORAL
  Filled 2021-08-26: qty 2

## 2021-08-26 NOTE — Progress Notes (Signed)
Patient presents today for blood transfusion.  Patient is in satisfactory condition with no new complaints voiced.  Vital signs are stable.  Labs reviewed.  Hemoglobin today is 8.0 and platelets are 21.  Patient will receive one unit of PRBC and platelets today per Dr. Delton Coombes.   ? ?Patient tolerated treatment well with no complaints voiced.  Patient left via wheelchair in stable condition.  Vital signs stable at discharge.  Follow up as scheduled.    ?

## 2021-08-26 NOTE — Progress Notes (Signed)
?  CRITICAL VALUE ALERT ?Critical value received:  platelets 21 ?Date of notification:  08-26-21 ?Time of notification: 6712 ?Critical value read back:  Yes.   ?Nurse who received alert:  C. Murrell Dome RN ?MD notified time and response:  0840, will give one unit of blood and platelets per MD ? ?Hemoglobin is 8.0, MD notified and will give one unit of blood per MD order ? ?

## 2021-08-26 NOTE — Patient Instructions (Signed)
[facility]  Discharge Instructions: Thank you for choosing [facility] to provide your oncology and hematology care.  If you have a lab appointment with the Cancer Center, please come in thru the Main Entrance and check in at the main information desk.  Wear comfortable clothing and clothing appropriate for easy access to any Portacath or PICC line.   We strive to give you quality time with your provider. You may need to reschedule your appointment if you arrive late (15 or more minutes).  Arriving late affects you and other patients whose appointments are after yours.  Also, if you miss three or more appointments without notifying the office, you may be dismissed from the clinic at the provider's discretion.      For prescription refill requests, have your pharmacy contact our office and allow 72 hours for refills to be completed.        To help prevent nausea and vomiting after your treatment, we encourage you to take your nausea medication as directed.  BELOW ARE SYMPTOMS THAT SHOULD BE REPORTED IMMEDIATELY: *FEVER GREATER THAN 100.4 F (38 C) OR HIGHER *CHILLS OR SWEATING *NAUSEA AND VOMITING THAT IS NOT CONTROLLED WITH YOUR NAUSEA MEDICATION *UNUSUAL SHORTNESS OF BREATH *UNUSUAL BRUISING OR BLEEDING *URINARY PROBLEMS (pain or burning when urinating, or frequent urination) *BOWEL PROBLEMS (unusual diarrhea, constipation, pain near the anus) TENDERNESS IN MOUTH AND THROAT WITH OR WITHOUT PRESENCE OF ULCERS (sore throat, sores in mouth, or a toothache) UNUSUAL RASH, SWELLING OR PAIN  UNUSUAL VAGINAL DISCHARGE OR ITCHING   Items with * indicate a potential emergency and should be followed up as soon as possible or go to the Emergency Department if any problems should occur.  Please show the CHEMOTHERAPY ALERT CARD or IMMUNOTHERAPY ALERT CARD at check-in to the Emergency Department and triage nurse.  Should you have questions after your visit or need to cancel  or reschedule your appointment, please contact [facility] [phone]  and follow the prompts.  Office hours are 8:00 a.m. to 4:30 p.m. Monday - Friday. Please note that voicemails left after 4:00 p.m. may not be returned until the following business day.  We are closed weekends and major holidays. You have access to a nurse at all times for urgent questions. Please call the main number to the clinic [phone] and follow the prompts.  For any non-urgent questions, you may also contact your provider using MyChart. We now offer e-Visits for anyone 18 and older to request care online for non-urgent symptoms. For details visit [email or web address].   Also download the MyChart app! Go to the app store, search "MyChart", open the app, select [place], and log in with your MyChart username and password.  Due to Covid, a mask is required upon entering the hospital/clinic. If you do not have a mask, one will be given to you upon arrival. For doctor visits, patients may have 1 support person aged 18 or older with them. For treatment visits, patients cannot have anyone with them due to current Covid guidelines and our immunocompromised population.  

## 2021-08-27 LAB — TYPE AND SCREEN
ABO/RH(D): O POS
Antibody Screen: NEGATIVE
Unit division: 0

## 2021-08-27 LAB — BPAM PLATELET PHERESIS
Blood Product Expiration Date: 202303162359
ISSUE DATE / TIME: 202303151118
Unit Type and Rh: 5100

## 2021-08-27 LAB — PREPARE PLATELET PHERESIS: Unit division: 0

## 2021-08-27 LAB — BPAM RBC
Blood Product Expiration Date: 202304162359
ISSUE DATE / TIME: 202303150952
Unit Type and Rh: 5100

## 2021-09-02 ENCOUNTER — Inpatient Hospital Stay (HOSPITAL_COMMUNITY): Payer: Medicare Other

## 2021-09-02 ENCOUNTER — Other Ambulatory Visit: Payer: Self-pay

## 2021-09-02 ENCOUNTER — Inpatient Hospital Stay (HOSPITAL_BASED_OUTPATIENT_CLINIC_OR_DEPARTMENT_OTHER): Payer: Medicare Other | Admitting: Hematology

## 2021-09-02 VITALS — BP 108/72 | HR 110 | Temp 98.8°F | Resp 20

## 2021-09-02 DIAGNOSIS — C61 Malignant neoplasm of prostate: Secondary | ICD-10-CM

## 2021-09-02 DIAGNOSIS — D649 Anemia, unspecified: Secondary | ICD-10-CM

## 2021-09-02 DIAGNOSIS — C7951 Secondary malignant neoplasm of bone: Secondary | ICD-10-CM

## 2021-09-02 DIAGNOSIS — Z95828 Presence of other vascular implants and grafts: Secondary | ICD-10-CM

## 2021-09-02 LAB — CBC WITH DIFFERENTIAL/PLATELET
Band Neutrophils: 5 %
Basophils Absolute: 0 10*3/uL (ref 0.0–0.1)
Basophils Relative: 0 %
Eosinophils Absolute: 0 10*3/uL (ref 0.0–0.5)
Eosinophils Relative: 0 %
HCT: 24 % — ABNORMAL LOW (ref 39.0–52.0)
Hemoglobin: 7.9 g/dL — ABNORMAL LOW (ref 13.0–17.0)
Lymphocytes Relative: 12 %
Lymphs Abs: 0.5 10*3/uL — ABNORMAL LOW (ref 0.7–4.0)
MCH: 30.2 pg (ref 26.0–34.0)
MCHC: 32.9 g/dL (ref 30.0–36.0)
MCV: 91.6 fL (ref 80.0–100.0)
Metamyelocytes Relative: 2 %
Monocytes Absolute: 0.3 10*3/uL (ref 0.1–1.0)
Monocytes Relative: 8 %
Myelocytes: 3 %
Neutro Abs: 3.2 10*3/uL (ref 1.7–7.7)
Neutrophils Relative %: 70 %
Platelets: 26 10*3/uL — CL (ref 150–400)
RBC: 2.62 MIL/uL — ABNORMAL LOW (ref 4.22–5.81)
RDW: 16.8 % — ABNORMAL HIGH (ref 11.5–15.5)
WBC: 4.3 10*3/uL (ref 4.0–10.5)
nRBC: 2.3 % — ABNORMAL HIGH (ref 0.0–0.2)

## 2021-09-02 LAB — COMPREHENSIVE METABOLIC PANEL
ALT: 32 U/L (ref 0–44)
AST: 42 U/L — ABNORMAL HIGH (ref 15–41)
Albumin: 2.2 g/dL — ABNORMAL LOW (ref 3.5–5.0)
Alkaline Phosphatase: 314 U/L — ABNORMAL HIGH (ref 38–126)
Anion gap: 12 (ref 5–15)
BUN: 25 mg/dL — ABNORMAL HIGH (ref 8–23)
CO2: 18 mmol/L — ABNORMAL LOW (ref 22–32)
Calcium: 7.8 mg/dL — ABNORMAL LOW (ref 8.9–10.3)
Chloride: 101 mmol/L (ref 98–111)
Creatinine, Ser: 0.99 mg/dL (ref 0.61–1.24)
GFR, Estimated: >60 mL/min (ref >60)
Glucose, Bld: 219 mg/dL — ABNORMAL HIGH (ref 70–99)
Potassium: 4.4 mmol/L (ref 3.5–5.1)
Sodium: 131 mmol/L — ABNORMAL LOW (ref 135–145)
Total Bilirubin: 0.7 mg/dL (ref 0.3–1.2)
Total Protein: 5.7 g/dL — ABNORMAL LOW (ref 6.5–8.1)

## 2021-09-02 LAB — SAMPLE TO BLOOD BANK

## 2021-09-02 LAB — PREPARE RBC (CROSSMATCH)

## 2021-09-02 LAB — MAGNESIUM: Magnesium: 1.7 mg/dL (ref 1.7–2.4)

## 2021-09-02 MED ORDER — ACETAMINOPHEN 325 MG PO TABS
650.0000 mg | ORAL_TABLET | Freq: Once | ORAL | Status: AC
  Administered 2021-09-02: 650 mg via ORAL
  Filled 2021-09-02: qty 2

## 2021-09-02 MED ORDER — SODIUM CHLORIDE 0.9% FLUSH
10.0000 mL | INTRAVENOUS | Status: AC | PRN
  Administered 2021-09-02: 10 mL

## 2021-09-02 MED ORDER — TRAMADOL HCL 50 MG PO TABS
50.0000 mg | ORAL_TABLET | Freq: Once | ORAL | Status: AC
  Administered 2021-09-02: 50 mg via ORAL
  Filled 2021-09-02: qty 1

## 2021-09-02 MED ORDER — SODIUM CHLORIDE 0.9% IV SOLUTION
250.0000 mL | Freq: Once | INTRAVENOUS | Status: AC
  Administered 2021-09-02: 250 mL via INTRAVENOUS

## 2021-09-02 MED ORDER — HEPARIN SOD (PORK) LOCK FLUSH 100 UNIT/ML IV SOLN
500.0000 [IU] | Freq: Every day | INTRAVENOUS | Status: AC | PRN
  Administered 2021-09-02: 500 [IU]

## 2021-09-02 MED ORDER — DIPHENHYDRAMINE HCL 25 MG PO CAPS
25.0000 mg | ORAL_CAPSULE | Freq: Once | ORAL | Status: AC
  Administered 2021-09-02: 25 mg via ORAL
  Filled 2021-09-02: qty 1

## 2021-09-02 NOTE — Patient Instructions (Signed)
[place]  Discharge Instructions: ?Thank you for choosing [place] to provide your oncology and hematology care.  ?If you have a lab appointment with the [facility], please come in thru the Main Entrance and check in at the main information desk. ? ?Wear comfortable clothing and clothing appropriate for easy access to any Portacath or PICC line.  ? ?We strive to give you quality time with your provider. You may need to reschedule your appointment if you arrive late (15 or more minutes).  Arriving late affects you and other patients whose appointments are after yours.  Also, if you miss three or more appointments without notifying the office, you may be dismissed from the clinic at the provider?s discretion.    ?  ?For prescription refill requests, have your pharmacy contact our office and allow 72 hours for refills to be completed.   ? ?Today you received the following 1 unit of blood, return as scheduled. ?  ?To help prevent nausea and vomiting after your treatment, we encourage you to take your nausea medication as directed. ? ?BELOW ARE SYMPTOMS THAT SHOULD BE REPORTED IMMEDIATELY: ?*FEVER GREATER THAN 100.4 F (38 ?C) OR HIGHER ?*CHILLS OR SWEATING ?*NAUSEA AND VOMITING THAT IS NOT CONTROLLED WITH YOUR NAUSEA MEDICATION ?*UNUSUAL SHORTNESS OF BREATH ?*UNUSUAL BRUISING OR BLEEDING ?*URINARY PROBLEMS (pain or burning when urinating, or frequent urination) ?*BOWEL PROBLEMS (unusual diarrhea, constipation, pain near the anus) ?TENDERNESS IN MOUTH AND THROAT WITH OR WITHOUT PRESENCE OF ULCERS (sore throat, sores in mouth, or a toothache) ?UNUSUAL RASH, SWELLING OR PAIN  ?UNUSUAL VAGINAL DISCHARGE OR ITCHING  ? ?Items with * indicate a potential emergency and should be followed up as soon as possible or go to the Emergency Department if any problems should occur. ? ?Please show the CHEMOTHERAPY ALERT CARD or IMMUNOTHERAPY ALERT CARD at check-in to the Emergency Department and triage  nurse. ? ?Should you have questions after your visit or need to cancel or reschedule your appointment, please contact [facility] [phone]  and follow the prompts.  Office hours are 8:00 a.m. to 4:30 p.m. Monday - Friday. Please note that voicemails left after 4:00 p.m. may not be returned until the following business day.  We are closed weekends and major holidays. You have access to a nurse at all times for urgent questions. Please call the main number to the clinic [phone] and follow the prompts. ? ?For any non-urgent questions, you may also contact your provider using MyChart. We now offer e-Visits for anyone 80 and older to request care online for non-urgent symptoms. For details visit [email or web address]. ?  ?Also download the MyChart app! Go to the app store, search "MyChart", open the app, select [place], and log in with your MyChart username and password. ? ?Due to Covid, a mask is required upon entering the hospital/clinic. If you do not have a mask, one will be given to you upon arrival. For doctor visits, patients may have 1 support person aged 76 or older with them. For treatment visits, patients cannot have anyone with them due to current Covid guidelines and our immunocompromised population.  ?

## 2021-09-02 NOTE — Progress Notes (Signed)
Patient tolerated blood transfusion, with no complaints voiced. Side effects with management reviewed with understanding verbalized. Port site clean and dry with no bruising or swelling noted at site. Good blood return noted before and after administration of therapy. Band aid applied. Patient left in satisfactory condition with VSS and no s/s of distress noted.  

## 2021-09-02 NOTE — Progress Notes (Signed)
CRITICAL VALUE ALERT ?Critical value received:  platelets 26 ?Date of notification:  09-02-21 ?Time of notification: [phone] ?Critical value read back:  Yes.   ?Nurse who received alert:  Forest Gleason RN ?MD notified time and response:  9450, waiting response , will give one unit of blood per MD. ?

## 2021-09-02 NOTE — Patient Instructions (Addendum)
[place] at [facility] ?Discharge Instructions ? ? ?You were seen and examined today by Dr. Delton Coombes. ? ?We will give you one unit of blood today. ? ?We will continue weekly lab checks and blood.  ? ?Return as scheduled.  ? ? ?Thank you for choosing [place] at [facility] to provide your oncology and hematology care.  To afford each patient quality time with our provider, please arrive at least 15 minutes before your scheduled appointment time.  ? ?If you have a lab appointment with the [place] please come in thru the Main Entrance and check in at the main information desk. ? ?You need to re-schedule your appointment should you arrive 10 or more minutes late.  We strive to give you quality time with our providers, and arriving late affects you and other patients whose appointments are after yours.  Also, if you no show three or more times for appointments you may be dismissed from the clinic at the providers discretion.     ?Again, thank you for choosing [facility].  Our hope is that these requests will decrease the amount of time that you wait before being seen by our physicians.       ?_____________________________________________________________ ? ?Should you have questions after your visit to [facility], please contact our office at [phone] and follow the prompts.  Our office hours are 8:00 a.m. and 4:30 p.m. Monday - Friday.  Please note that voicemails left after 4:00 p.m. may not be returned until the following business day.  We are closed weekends and major holidays.  You do have access to a nurse 24-7, just call the main number to the clinic [phone] and do not press any options, hold on the line and a nurse will answer the phone.   ? ?For prescription refill requests, have your pharmacy contact our office and allow 72 hours.   ? ?Due to Covid, you will need to wear a mask upon entering the hospital. If you do  not have a mask, a mask will be given to you at the Main Entrance upon arrival. For doctor visits, patients may have 1 support person age 23 or older with them. For treatment visits, patients can not have anyone with them due to social distancing guidelines and our immunocompromised population.  ? ?   ?

## 2021-09-02 NOTE — Progress Notes (Signed)
? ?Hunter Jones ?[address] ?[place], [place] [address] ? ? ?CLINIC:  ?Medical Oncology/Hematology ? ?PCP:  ?Hunter Blitz, MD ?[address] [place] [address] ?[phone] ? ? 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 ?PRIOR THERAPY:  ?1. Radical resection of prostate on 06/27/2014. ?2. Zytiga from 05/19/2018 to 11/09/2019. ?3. Xtandi from 11/21/2019 to 12/05/2019, stopped due to mood changes ? ?NGS Results: Guardant AR amplification, MSI normal, TMB 16 Muts/Mb ? ?CURRENT THERAPY: Docetaxel every 3 weeks; Xgeva monthly; Lupron every 3 months ? ?BRIEF ONCOLOGIC HISTORY:  ?Oncology History  ?Prostate Jones [facility])  ?02/06/2014 Procedure  ? Prostate biopsy by Dr. Clyde Jones ?  ?02/11/2014 Pathology Results  ? Prostatic adenocarcinoma identified in 5 of 6 prostate specimens with Gleason pattern showing primary pattern-grade 4, secondary pattern grade 3.  Total Gleason score equals 7.  Proportion of prostatic tissue involved by tumor: 70%. ?  ?05/03/2014 Imaging  ? Bone scan- Negative ?  ?  ?06/27/2014 Procedure  ? Radical resection of prostate by Dr. Raynelle Jones ?  ?07/29/2014 Pathology Results  ? 1. Prostate, radical resection ?- PROSTATIC ADENOCARCINOMA, GLEASON SCORE 4 + 3 = 7. ?- RIGHT AND LEFT PROSTATE INVOLVED. ?- RIGHT AND LEFT SEMINAL VESICLES INVOLVED BY TUMOR. ?- EXTRACAPSULAR EXTENSION BY TUMOR. ?- TUMOR EXTENDS INTO BLADDER NECK TISSUE. ?- MARGINS NOT INVOLVED. ?2. Lymph nodes, regional resection, left pelvic ?- THREE BENIGN LYMPH NODES (0/3). ?3. Lymph nodes, regional resection, right pelvic ?- FOUR BENIGN LYMPH NODES (0/4). ?  ?11/16/2016 Imaging  ? Bone scan- New areas of increased uptake superolateral to the left orbit ?(faint), at S1, and in the posterolateral aspect of the left sixth ?rib.  ?  ?11/25/2016 Imaging  ? CT CAP- 1. Status post radical prostatectomy. No findings to suggest local ?recurrence of disease or definite extraskeletal metastatic disease ?in the  chest, abdomen or pelvis. However, there are several osseous ?lesions, as above, concerning for metastatic disease to the bones. ?2. Hepatic steatosis. ?3. Aortic atherosclerosis, in addition to 2 vessel coronary artery ?disease. Please note that although the presence of coronary artery ?calcium documents the presence of coronary artery disease, the ?severity of this disease and any potential stenosis cannot be ?assessed on this non-gated CT examination. Assessment for potential ?risk factor modification, dietary therapy or pharmacologic therapy ?may be warranted, if clinically indicated. ?4. Additional incidental findings, as above. ?  ? Genetic Testing  ? Guardant 360 Results: ? ? ? ? ? ? ?  ?06/23/2020 - 10/08/2020 Chemotherapy  ? Patient is on Treatment Plan : PROSTATE Docetaxel + Prednisone q21d  ?   ?04/30/2021 -  Chemotherapy  ? Patient is on Treatment Plan : PROSTATE Cabazitaxel + Prednisone q21d  ?   ? ? ?Jones STAGING: ? Jones Staging  ?Prostate Jones ([place]) ?Staging form: Prostate, AJCC 7th Edition ?- Pathologic stage from 07/29/2014: Stage III (T3b, N0, cM0, Gleason 7) - Signed by Hunter Cancer, PA-C on 11/09/2016 ?- Clinical: Stage IV (T3, N0, M1, PSA: Less than 10, Gleason 7) - Signed by Hunter First, MD on 12/21/2016 ?- Pathologic: No stage assigned - Unsigned ? ? ?INTERVAL HISTORY:  ?[name], a 66 y.o. 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 Blanca was last seen on 08/12/2021.  ? ?Today he reports feeling fair, and he is accompanied by his daughter. He reports knee pain, and he gets 3 hours of pain relief with 5 mg  oxycodone. His daughter reports he had a fall 2 days ago, and he is fatigued. He has difficulty sleeping, and he reports SOB. His daughter reports he had an episode of memory loss yesterday. His daughter reports he is complaining his right ear feels "blocked".  ? ?REVIEW OF SYSTEMS:  ?Review of Systems  ?Constitutional:  Positive for  fatigue. Negative for appetite change.  ?Respiratory:  Positive for shortness of breath.   ?Cardiovascular:  Positive for palpitations.  ?Genitourinary:  Positive for hematuria.   ?Musculoskeletal:  Positive for arthralgias (6/10 knees) and myalgias (6/10 buttock').  ?Neurological:  Positive for dizziness.  ?Psychiatric/Behavioral:  Positive for confusion and sleep disturbance.   ?All other systems reviewed and are negative. ? ?PAST MEDICAL/SURGICAL HISTORY:  ?Past Medical History:  ?Diagnosis Date  ? Acute ST elevation myocardial infarction (STEMI) of inferior wall ([place]) 11/2019  ? Asthma   ? COPD (chronic obstructive pulmonary disease) ([place])   ? Coronary artery disease   ? DES to mid LAD August 2014 ([facility]); overlapping DESx2 prox-mid RCA 11/2019  ? Essential hypertension   ? Hyperthyroidism   ? NSTEMI (non-ST elevated myocardial infarction) ([facility]) 2014  ? Port-A-Cath in place 06/17/2020  ? Prostate Jones ([place])   ? Type 2 diabetes mellitus ([place])   ? ?Past Surgical History:  ?Procedure Laterality Date  ? APPENDECTOMY    ? CORONARY/GRAFT ACUTE MI REVASCULARIZATION N/A 12/10/2019  ? Procedure: Coronary/Graft Acute MI Revascularization;  Surgeon: Hunter Bush, MD;  Location: [facility];  Service: Cardiovascular;  Laterality: N/A;  ? IR IMAGING GUIDED PORT INSERTION  06/19/2020  ? LEFT HEART CATH AND CORONARY ANGIOGRAPHY N/A 12/10/2019  ? Procedure: LEFT HEART CATH AND CORONARY ANGIOGRAPHY;  Surgeon: Hunter Bush, MD;  Location: [facility];  Service: Cardiovascular;  Laterality: N/A;  ? LYMPHADENECTOMY Bilateral 06/27/2014  ? Procedure: LYMPHADENECTOMY;  Surgeon: Hunter Bring, MD;  Location: [facility];  Service: Urology;  Laterality: Bilateral;  ? ROBOT ASSISTED LAPAROSCOPIC RADICAL PROSTATECTOMY N/A 06/27/2014  ? Procedure: ROBOTIC ASSISTED LAPAROSCOPIC RADICAL PROSTATECTOMY LEVEL 3;  Surgeon: Hunter Bring, MD;  Location: [facility];  Service: Urology;  Laterality: N/A;  ? TONSILLECTOMY    ? ? ?SOCIAL  HISTORY:  ?Social History  ? ?Socioeconomic History  ? Marital status: Married  ?  Spouse name: Not on file  ? Number of children: 0  ? Years of education: Not on file  ? Highest education level: Not on file  ?Occupational History  ? Occupation: truck Geophysicist/field seismologist  ?  Comment: retired  ?Tobacco Use  ? Smoking status: Former  ?  Packs/day: 1.00  ?  Years: 40.00  ?  Pack years: 40.00  ?  Types: Cigarettes  ?  Quit date: 06/21/2012  ?  Years since quitting: 9.2  ? Smokeless tobacco: Never  ?Vaping Use  ? Vaping Use: Never used  ?Substance and Sexual Activity  ? Alcohol use: Yes  ?  Comment: occasional  ? Drug use: No  ? Sexual activity: Not Currently  ?Other Topics Concern  ? Not on file  ?Social History Narrative  ? Not on file  ? ?Social Determinants of Health  ? ?Financial Resource Strain: Not on file  ?Food Insecurity: Not on file  ?Transportation Needs: Not on file  ?Physical Activity: Not on file  ?Stress: Not on file  ?Social Connections: Not on file  ?Intimate Partner Violence: Not on file  ? ? ?FAMILY HISTORY:  ?Family History  ?Problem Relation Age of Onset  ? Dementia  Mother   ? Thyroid disease Mother   ? Clotting disorder Mother   ? Kidney Stones Father   ? Stroke Brother   ? Alzheimer's disease Maternal Uncle   ? Tuberculosis Paternal Grandmother   ? Heart attack Maternal Uncle   ?     x4  ? Heart attack Cousin   ?     in his 65s  ? Jones Neg Hx   ? ? ?CURRENT MEDICATIONS:  ?Current Outpatient Medications  ?Medication Sig Dispense Refill  ? ADVAIR DISKUS 250-50 MCG/ACT AEPB Inhale 1 puff into the lungs 2 (two) times daily.    ? albuterol (PROVENTIL HFA;VENTOLIN HFA) 108 (90 BASE) MCG/ACT inhaler Inhale 1-2 puffs into the lungs every 6 (six) hours as needed for wheezing or shortness of breath.    ? Ascorbic Acid (VITAMIN C PO) Take 1 tablet by mouth daily.    ? aspirin EC 81 MG tablet Take 81 mg by mouth 3 (three) times a week.    ? atorvastatin (LIPITOR) 40 MG tablet TAKE 1 TABLET BY MOUTH AT BEDTIME. (Patient  taking differently: Take 40 mg by mouth at bedtime.) 30 tablet 6  ? BREO ELLIPTA 200-25 MCG/ACT AEPB Inhale 1 puff into the lungs daily.    ? Calcium Carb-Cholecalciferol (HM CALCIUM-VITAMIN D) 600-800 MG-UNIT TABS T

## 2021-09-02 NOTE — Progress Notes (Signed)
Patients port flushed without difficulty.  Good blood return noted with no bruising or swelling noted at site.  Stable during access and blood draw.  Patient to remain accessed for possible blood transfusion.   

## 2021-09-03 ENCOUNTER — Encounter (HOSPITAL_COMMUNITY): Payer: Self-pay

## 2021-09-03 ENCOUNTER — Other Ambulatory Visit (HOSPITAL_COMMUNITY): Payer: Self-pay | Admitting: Physician Assistant

## 2021-09-03 DIAGNOSIS — C7951 Secondary malignant neoplasm of bone: Secondary | ICD-10-CM

## 2021-09-03 LAB — BPAM RBC
Blood Product Expiration Date: 202304242359
ISSUE DATE / TIME: 202303221130
Unit Type and Rh: 5100

## 2021-09-03 LAB — TYPE AND SCREEN
ABO/RH(D): O POS
Antibody Screen: NEGATIVE
Unit division: 0

## 2021-09-03 MED ORDER — TRAMADOL HCL 50 MG PO TABS: 50.0000 mg | ORAL_TABLET | Freq: Four times a day (QID) | ORAL | 0 refills | Status: DC | PRN

## 2021-09-03 NOTE — Progress Notes (Signed)
Phone call received from [facility] requesting a prescription for Tramadol as it was given in the clinic yesterday and has been beneficial in pain management. Tarri Abernethy, PA made aware and has sent prescription, see orders for further details. Anderson Malta made aware an verbalized understanding. ?

## 2021-09-09 ENCOUNTER — Inpatient Hospital Stay (HOSPITAL_COMMUNITY): Payer: Medicare Other

## 2021-09-09 ENCOUNTER — Other Ambulatory Visit: Payer: Self-pay

## 2021-09-09 VITALS — BP 101/53 | HR 113 | Temp 97.2°F | Resp 20

## 2021-09-09 DIAGNOSIS — C61 Malignant neoplasm of prostate: Secondary | ICD-10-CM

## 2021-09-09 DIAGNOSIS — Z95828 Presence of other vascular implants and grafts: Secondary | ICD-10-CM

## 2021-09-09 DIAGNOSIS — D649 Anemia, unspecified: Secondary | ICD-10-CM | POA: Diagnosis not present

## 2021-09-09 LAB — CBC WITH DIFFERENTIAL/PLATELET
Band Neutrophils: 2 %
Basophils Absolute: 0 10*3/uL (ref 0.0–0.1)
Basophils Relative: 0 %
Eosinophils Absolute: 0 10*3/uL (ref 0.0–0.5)
Eosinophils Relative: 0 %
HCT: 23.7 % — ABNORMAL LOW (ref 39.0–52.0)
Hemoglobin: 7.8 g/dL — ABNORMAL LOW (ref 13.0–17.0)
Lymphocytes Relative: 9 %
Lymphs Abs: 0.4 10*3/uL — ABNORMAL LOW (ref 0.7–4.0)
MCH: 31.1 pg (ref 26.0–34.0)
MCHC: 32.9 g/dL (ref 30.0–36.0)
MCV: 94.4 fL (ref 80.0–100.0)
Monocytes Absolute: 0.4 10*3/uL (ref 0.1–1.0)
Monocytes Relative: 8 %
Myelocytes: 1 %
Neutro Abs: 3.9 10*3/uL (ref 1.7–7.7)
Neutrophils Relative %: 80 %
Platelets: 27 10*3/uL — CL (ref 150–400)
RBC: 2.51 MIL/uL — ABNORMAL LOW (ref 4.22–5.81)
RDW: 17.5 % — ABNORMAL HIGH (ref 11.5–15.5)
WBC: 4.7 10*3/uL (ref 4.0–10.5)
nRBC: 3 % — ABNORMAL HIGH (ref 0.0–0.2)

## 2021-09-09 LAB — PREPARE RBC (CROSSMATCH)

## 2021-09-09 MED ORDER — ACETAMINOPHEN 325 MG PO TABS
650.0000 mg | ORAL_TABLET | Freq: Once | ORAL | Status: AC
  Administered 2021-09-09: 650 mg via ORAL
  Filled 2021-09-09: qty 2

## 2021-09-09 MED ORDER — SODIUM CHLORIDE 0.9% FLUSH
10.0000 mL | INTRAVENOUS | Status: AC | PRN
  Administered 2021-09-09: 10 mL

## 2021-09-09 MED ORDER — SODIUM CHLORIDE 0.9% IV SOLUTION
250.0000 mL | Freq: Once | INTRAVENOUS | Status: AC
  Administered 2021-09-09: 250 mL via INTRAVENOUS

## 2021-09-09 MED ORDER — DIPHENHYDRAMINE HCL 25 MG PO CAPS
25.0000 mg | ORAL_CAPSULE | Freq: Once | ORAL | Status: AC
  Administered 2021-09-09: 25 mg via ORAL
  Filled 2021-09-09: qty 1

## 2021-09-09 MED ORDER — HEPARIN SOD (PORK) LOCK FLUSH 100 UNIT/ML IV SOLN
500.0000 [IU] | Freq: Every day | INTRAVENOUS | Status: AC | PRN
  Administered 2021-09-09: 500 [IU]

## 2021-09-09 NOTE — Progress Notes (Signed)
Patient presents today for possible blood transfusion per providers order.  Patient has no new complaints at this time. ? ?Hgb 7.8, Platelets 27, MD notified.  Patient to receive 1UPRBC per Dr. Delton Coombes. ? ?1UPRBC given today per MD orders.  Stable during infusion without adverse affects.  Vital signs stable.  No complaints at this time.  Discharge from clinic ambulatory in stable condition.  Alert and oriented X 3.  Follow up with [facility] as scheduled.  ?

## 2021-09-09 NOTE — Progress Notes (Signed)
CRITICAL VALUE ALERT ?Critical value received:  platelets 27 ?Date of notification:  23-29-23 ?Time of notification: 0905 ?Critical value read back:  Yes.   ?Nurse who received alert:  C. Hadassah Rana RN ?MD notified time and response:  0906 , will give one unit of blood per MD orders.  ?

## 2021-09-09 NOTE — Patient Instructions (Signed)
[facility]  Discharge Instructions: ?Thank you for choosing [facility] to provide your oncology and hematology care.  ?If you have a lab appointment with the Cancer Center, please come in thru the Main Entrance and check in at the main information desk. ? ?Wear comfortable clothing and clothing appropriate for easy access to any Portacath or PICC line.  ? ?We strive to give you quality time with your provider. You may need to reschedule your appointment if you arrive late (15 or more minutes).  Arriving late affects you and other patients whose appointments are after yours.  Also, if you miss three or more appointments without notifying the office, you may be dismissed from the clinic at the provider?s discretion.    ?  ?For prescription refill requests, have your pharmacy contact our office and allow 72 hours for refills to be completed.   ? ?Today you received the following chemotherapy and/or immunotherapy agents 1UPRBC    ?  ?To help prevent nausea and vomiting after your treatment, we encourage you to take your nausea medication as directed. ? ?BELOW ARE SYMPTOMS THAT SHOULD BE REPORTED IMMEDIATELY: ?*FEVER GREATER THAN 100.4 F (38 ?C) OR HIGHER ?*CHILLS OR SWEATING ?*NAUSEA AND VOMITING THAT IS NOT CONTROLLED WITH YOUR NAUSEA MEDICATION ?*UNUSUAL SHORTNESS OF BREATH ?*UNUSUAL BRUISING OR BLEEDING ?*URINARY PROBLEMS (pain or burning when urinating, or frequent urination) ?*BOWEL PROBLEMS (unusual diarrhea, constipation, pain near the anus) ?TENDERNESS IN MOUTH AND THROAT WITH OR WITHOUT PRESENCE OF ULCERS (sore throat, sores in mouth, or a toothache) ?UNUSUAL RASH, SWELLING OR PAIN  ?UNUSUAL VAGINAL DISCHARGE OR ITCHING  ? ?Items with * indicate a potential emergency and should be followed up as soon as possible or go to the Emergency Department if any problems should occur. ? ?Please show the CHEMOTHERAPY ALERT CARD or IMMUNOTHERAPY ALERT CARD at check-in to the Emergency  Department and triage nurse. ? ?Should you have questions after your visit or need to cancel or reschedule your appointment, please contact [facility] [phone]  and follow the prompts.  Office hours are 8:00 a.m. to 4:30 p.m. Monday - Friday. Please note that voicemails left after 4:00 p.m. may not be returned until the following business day.  We are closed weekends and major holidays. You have access to a nurse at all times for urgent questions. Please call the main number to the clinic [phone] and follow the prompts. ? ?For any non-urgent questions, you may also contact your provider using MyChart. We now offer e-Visits for anyone 18 and older to request care online for non-urgent symptoms. For details visit [email or web address]. ?  ?Also download the MyChart app! Go to the app store, search "MyChart", open the app, select [facility], and log in with your MyChart username and password. ? ?Due to Covid, a mask is required upon entering the hospital/clinic. If you do not have a mask, one will be given to you upon arrival. For doctor visits, patients may have 1 support person aged 18 or older with them. For treatment visits, patients cannot have anyone with them due to current Covid guidelines and our immunocompromised population.  ?

## 2021-09-10 ENCOUNTER — Encounter (HOSPITAL_COMMUNITY): Payer: Medicare Other

## 2021-09-10 ENCOUNTER — Other Ambulatory Visit (HOSPITAL_COMMUNITY): Payer: Medicare Other

## 2021-09-10 LAB — TYPE AND SCREEN
ABO/RH(D): O POS
Antibody Screen: NEGATIVE
Unit division: 0

## 2021-09-10 LAB — BPAM RBC
Blood Product Expiration Date: 202305042359
ISSUE DATE / TIME: 202303291000
Unit Type and Rh: 5100

## 2021-09-15 ENCOUNTER — Telehealth: Payer: Self-pay | Admitting: Cardiology

## 2021-09-15 NOTE — Telephone Encounter (Signed)
Patient's wife calling to get Dr. Myles Gip recommendation regarding her husbands appt in August. She states that her husband is under hospice care at home and was given 3-6 months to live back in March. She is not sure if he will be around for his August appointment and wants to know if she should cancel it or move it up so Dr. Domenic Polite could see him sooner. She states that it is nearly impossible to get him to come into an office and a virtual appt would work better if possible.  ?

## 2021-09-16 ENCOUNTER — Inpatient Hospital Stay (HOSPITAL_COMMUNITY): Payer: Medicare Other | Attending: Hematology

## 2021-09-16 ENCOUNTER — Inpatient Hospital Stay (HOSPITAL_COMMUNITY): Payer: Medicare Other

## 2021-09-16 ENCOUNTER — Other Ambulatory Visit (HOSPITAL_COMMUNITY): Payer: Self-pay

## 2021-09-16 VITALS — BP 111/71 | HR 111 | Temp 97.4°F | Resp 20

## 2021-09-16 DIAGNOSIS — Z95828 Presence of other vascular implants and grafts: Secondary | ICD-10-CM

## 2021-09-16 DIAGNOSIS — C61 Malignant neoplasm of prostate: Secondary | ICD-10-CM | POA: Insufficient documentation

## 2021-09-16 DIAGNOSIS — C7951 Secondary malignant neoplasm of bone: Secondary | ICD-10-CM | POA: Insufficient documentation

## 2021-09-16 DIAGNOSIS — D649 Anemia, unspecified: Secondary | ICD-10-CM | POA: Diagnosis not present

## 2021-09-16 LAB — CBC WITH DIFFERENTIAL/PLATELET
Abs Immature Granulocytes: 0.3 10*3/uL — ABNORMAL HIGH (ref 0.00–0.07)
Basophils Absolute: 0 10*3/uL (ref 0.0–0.1)
Basophils Relative: 1 %
Eosinophils Absolute: 0 10*3/uL (ref 0.0–0.5)
Eosinophils Relative: 0 %
HCT: 22.9 % — ABNORMAL LOW (ref 39.0–52.0)
Hemoglobin: 7.4 g/dL — ABNORMAL LOW (ref 13.0–17.0)
Immature Granulocytes: 7 %
Lymphocytes Relative: 16 %
Lymphs Abs: 0.7 10*3/uL (ref 0.7–4.0)
MCH: 30.5 pg (ref 26.0–34.0)
MCHC: 32.3 g/dL (ref 30.0–36.0)
MCV: 94.2 fL (ref 80.0–100.0)
Monocytes Absolute: 0.4 10*3/uL (ref 0.1–1.0)
Monocytes Relative: 9 %
Neutro Abs: 2.9 10*3/uL (ref 1.7–7.7)
Neutrophils Relative %: 67 %
Platelets: 27 10*3/uL — CL (ref 150–400)
RBC: 2.43 MIL/uL — ABNORMAL LOW (ref 4.22–5.81)
RDW: 18.1 % — ABNORMAL HIGH (ref 11.5–15.5)
WBC: 4.4 10*3/uL (ref 4.0–10.5)
nRBC: 3.2 % — ABNORMAL HIGH (ref 0.0–0.2)

## 2021-09-16 LAB — PREPARE RBC (CROSSMATCH)

## 2021-09-16 MED ORDER — SODIUM CHLORIDE 0.9% FLUSH
10.0000 mL | INTRAVENOUS | Status: AC | PRN
  Administered 2021-09-16: 10 mL

## 2021-09-16 MED ORDER — HEPARIN SOD (PORK) LOCK FLUSH 100 UNIT/ML IV SOLN
500.0000 [IU] | Freq: Every day | INTRAVENOUS | Status: AC | PRN
  Administered 2021-09-16: 500 [IU]

## 2021-09-16 MED ORDER — ACETAMINOPHEN 325 MG PO TABS
650.0000 mg | ORAL_TABLET | Freq: Once | ORAL | Status: AC
  Administered 2021-09-16: 650 mg via ORAL
  Filled 2021-09-16: qty 2

## 2021-09-16 MED ORDER — DIPHENHYDRAMINE HCL 25 MG PO CAPS
25.0000 mg | ORAL_CAPSULE | Freq: Once | ORAL | Status: AC
  Administered 2021-09-16: 25 mg via ORAL
  Filled 2021-09-16: qty 1

## 2021-09-16 MED ORDER — SODIUM CHLORIDE 0.9% IV SOLUTION
250.0000 mL | Freq: Once | INTRAVENOUS | Status: AC
  Administered 2021-09-16: 250 mL via INTRAVENOUS

## 2021-09-16 NOTE — Telephone Encounter (Signed)
Appointment changed to virtual. Wife informed and verbalized understanding of plan. ? ?  ?Patient Consent for Virtual Visit  ? ? ?   ? ?Hunter Jones has provided verbal consent on 09/16/2021 for a virtual visit (video or telephone). ? ? ?CONSENT FOR VIRTUAL VISIT FOR:  Hunter Jones  ?By participating in this virtual visit I agree to the following: ? ?I hereby voluntarily request, consent and authorize [facility] and its employed or contracted physicians, physician assistants, nurse practitioners or other licensed health care professionals (the Practitioner), to provide me with telemedicine health care services (the ?Services") as deemed necessary by the treating Practitioner. I acknowledge and consent to receive the Services by the Practitioner via telemedicine. I understand that the telemedicine visit will involve communicating with the Practitioner through live audiovisual communication technology and the disclosure of certain medical information by electronic transmission. I acknowledge that I have been given the opportunity to request an in-person assessment or other available alternative prior to the telemedicine visit and am voluntarily participating in the telemedicine visit. ? ?I understand that I have the right to withhold or withdraw my consent to the use of telemedicine in the course of my care at any time, without affecting my right to future care or treatment, and that the Practitioner or I may terminate the telemedicine visit at any time. I understand that I have the right to inspect all information obtained and/or recorded in the course of the telemedicine visit and may receive copies of available information for a reasonable fee.  I understand that some of the potential risks of receiving the Services via telemedicine include:  ?Delay or interruption in medical evaluation due to technological equipment failure or disruption; ?Information transmitted may not be sufficient (e.g. poor resolution of  images) to allow for appropriate medical decision making by the Practitioner; and/or  ?In rare instances, security protocols could fail, causing a breach of personal health information. ? ?Furthermore, I acknowledge that it is my responsibility to provide information about my medical history, conditions and care that is complete and accurate to the best of my ability. I acknowledge that Practitioner's advice, recommendations, and/or decision may be based on factors not within their control, such as incomplete or inaccurate data provided by me or distortions of diagnostic images or specimens that may result from electronic transmissions. I understand that the practice of medicine is not an exact science and that Practitioner makes no warranties or guarantees regarding treatment outcomes. I acknowledge that a copy of this consent can be made available to me via my patient portal ([facility]), or I can request a printed copy by calling the office of [facility].   ? ?I understand that my insurance will be billed for this visit.  ? ?I have read or had this consent read to me. ?I understand the contents of this consent, which adequately explains the benefits and risks of the Services being provided via telemedicine.  ?I have been provided ample opportunity to ask questions regarding this consent and the Services and have had my questions answered to my satisfaction. ?I give my informed consent for the services to be provided through the use of telemedicine in my medical care ? ? ?

## 2021-09-16 NOTE — Progress Notes (Signed)
CRITICAL VALUE ALERT ?Critical value received:  platelets 27 ?Date of notification:  09/16/2021 ?Time of notification: 08:30 am  ?Critical value read back:  Yes.   ?Nurse who received alert:  B. Samanthan Dugo RN ?MD notified time and response:  [place].  ? ?HGB 7.4. 2 units of blood ordered per standing order for patient.   ?

## 2021-09-16 NOTE — Progress Notes (Signed)
Patient presents today for possible blood transfusion per providers order.  Vital signs WNL.  Patient feels tired and weak. ? ?Hgb noted to be 7.4, patient will receive 2 UPRBC today. ? ?2 UPRBC given today per MD orders.  Stable during infusion without adverse affects.  Vital signs stable.  No complaints at this time.  Discharge from clinic via wheelchair in stable condition.  Alert and oriented X 3.  Follow up with [facility] as scheduled.  ?

## 2021-09-16 NOTE — Patient Instructions (Signed)
[facility]  Discharge Instructions: Thank you for choosing [facility] to provide your oncology and hematology care.  If you have a lab appointment with the Cancer Center, please come in thru the Main Entrance and check in at the main information desk.  Wear comfortable clothing and clothing appropriate for easy access to any Portacath or PICC line.   We strive to give you quality time with your provider. You may need to reschedule your appointment if you arrive late (15 or more minutes).  Arriving late affects you and other patients whose appointments are after yours.  Also, if you miss three or more appointments without notifying the office, you may be dismissed from the clinic at the provider's discretion.      For prescription refill requests, have your pharmacy contact our office and allow 72 hours for refills to be completed.    Today you received the following chemotherapy and/or immunotherapy agents 2UPRBC      To help prevent nausea and vomiting after your treatment, we encourage you to take your nausea medication as directed.  BELOW ARE SYMPTOMS THAT SHOULD BE REPORTED IMMEDIATELY: *FEVER GREATER THAN 100.4 F (38 C) OR HIGHER *CHILLS OR SWEATING *NAUSEA AND VOMITING THAT IS NOT CONTROLLED WITH YOUR NAUSEA MEDICATION *UNUSUAL SHORTNESS OF BREATH *UNUSUAL BRUISING OR BLEEDING *URINARY PROBLEMS (pain or burning when urinating, or frequent urination) *BOWEL PROBLEMS (unusual diarrhea, constipation, pain near the anus) TENDERNESS IN MOUTH AND THROAT WITH OR WITHOUT PRESENCE OF ULCERS (sore throat, sores in mouth, or a toothache) UNUSUAL RASH, SWELLING OR PAIN  UNUSUAL VAGINAL DISCHARGE OR ITCHING   Items with * indicate a potential emergency and should be followed up as soon as possible or go to the Emergency Department if any problems should occur.  Please show the CHEMOTHERAPY ALERT CARD or IMMUNOTHERAPY ALERT CARD at check-in to the Emergency  Department and triage nurse.  Should you have questions after your visit or need to cancel or reschedule your appointment, please contact [facility] [phone]  and follow the prompts.  Office hours are 8:00 a.m. to 4:30 p.m. Monday - Friday. Please note that voicemails left after 4:00 p.m. may not be returned until the following business day.  We are closed weekends and major holidays. You have access to a nurse at all times for urgent questions. Please call the main number to the clinic [phone] and follow the prompts.  For any non-urgent questions, you may also contact your provider using MyChart. We now offer e-Visits for anyone 18 and older to request care online for non-urgent symptoms. For details visit [email or web address].   Also download the MyChart app! Go to the app store, search "MyChart", open the app, select [facility], and log in with your MyChart username and password.  Due to Covid, a mask is required upon entering the hospital/clinic. If you do not have a mask, one will be given to you upon arrival. For doctor visits, patients may have 1 support person aged 18 or older with them. For treatment visits, patients cannot have anyone with them due to current Covid guidelines and our immunocompromised population.  

## 2021-09-17 ENCOUNTER — Inpatient Hospital Stay (HOSPITAL_COMMUNITY): Payer: Medicare Other

## 2021-09-17 LAB — TYPE AND SCREEN
ABO/RH(D): O POS
Antibody Screen: NEGATIVE
Unit division: 0
Unit division: 0

## 2021-09-17 LAB — BPAM RBC
Blood Product Expiration Date: 202305012359
Blood Product Expiration Date: 202305012359
ISSUE DATE / TIME: 202304050916
ISSUE DATE / TIME: 202304051054
Unit Type and Rh: 5100
Unit Type and Rh: 5100

## 2021-09-23 ENCOUNTER — Inpatient Hospital Stay (HOSPITAL_COMMUNITY): Payer: Medicare Other

## 2021-09-23 VITALS — BP 100/64 | HR 112 | Temp 98.0°F | Resp 18

## 2021-09-23 DIAGNOSIS — C61 Malignant neoplasm of prostate: Secondary | ICD-10-CM

## 2021-09-23 DIAGNOSIS — D649 Anemia, unspecified: Secondary | ICD-10-CM | POA: Diagnosis not present

## 2021-09-23 DIAGNOSIS — Z95828 Presence of other vascular implants and grafts: Secondary | ICD-10-CM

## 2021-09-23 DIAGNOSIS — C7951 Secondary malignant neoplasm of bone: Secondary | ICD-10-CM

## 2021-09-23 LAB — CBC WITH DIFFERENTIAL/PLATELET
Band Neutrophils: 4 %
Basophils Relative: 0 %
Eosinophils Relative: 0 %
HCT: 26.1 % — ABNORMAL LOW (ref 39.0–52.0)
Hemoglobin: 8.7 g/dL — ABNORMAL LOW (ref 13.0–17.0)
Lymphocytes Relative: 5 %
MCH: 31.1 pg (ref 26.0–34.0)
MCHC: 33.3 g/dL (ref 30.0–36.0)
MCV: 93.2 fL (ref 80.0–100.0)
Monocytes Relative: 8 %
Myelocytes: 2 %
Neutrophils Relative %: 81 %
Platelets: 30 10*3/uL — ABNORMAL LOW (ref 150–400)
RBC: 2.8 MIL/uL — ABNORMAL LOW (ref 4.22–5.81)
RDW: 17.9 % — ABNORMAL HIGH (ref 11.5–15.5)
WBC: 4.9 10*3/uL (ref 4.0–10.5)
nRBC: 1.6 % — ABNORMAL HIGH (ref 0.0–0.2)
nRBC: 2 /100 WBC — ABNORMAL HIGH

## 2021-09-23 LAB — COMPREHENSIVE METABOLIC PANEL
ALT: 24 U/L (ref 0–44)
AST: 40 U/L (ref 15–41)
Albumin: 2.3 g/dL — ABNORMAL LOW (ref 3.5–5.0)
Alkaline Phosphatase: 349 U/L — ABNORMAL HIGH (ref 38–126)
Anion gap: 11 (ref 5–15)
BUN: 22 mg/dL (ref 8–23)
CO2: 17 mmol/L — ABNORMAL LOW (ref 22–32)
Calcium: 7.8 mg/dL — ABNORMAL LOW (ref 8.9–10.3)
Chloride: 105 mmol/L (ref 98–111)
Creatinine, Ser: 0.86 mg/dL (ref 0.61–1.24)
GFR, Estimated: >60 mL/min (ref >60)
Glucose, Bld: 204 mg/dL — ABNORMAL HIGH (ref 70–99)
Potassium: 4.2 mmol/L (ref 3.5–5.1)
Sodium: 133 mmol/L — ABNORMAL LOW (ref 135–145)
Total Bilirubin: 0.8 mg/dL (ref 0.3–1.2)
Total Protein: 6.2 g/dL — ABNORMAL LOW (ref 6.5–8.1)

## 2021-09-23 LAB — MAGNESIUM: Magnesium: 1.7 mg/dL (ref 1.7–2.4)

## 2021-09-23 LAB — PSA: Prostatic Specific Antigen: 1880 ng/mL — ABNORMAL HIGH (ref 0.00–4.00)

## 2021-09-23 LAB — PREPARE RBC (CROSSMATCH)

## 2021-09-23 MED ORDER — SODIUM CHLORIDE 0.9% IV SOLUTION
250.0000 mL | Freq: Once | INTRAVENOUS | Status: AC
  Administered 2021-09-23: 250 mL via INTRAVENOUS

## 2021-09-23 MED ORDER — DIPHENHYDRAMINE HCL 25 MG PO CAPS
25.0000 mg | ORAL_CAPSULE | Freq: Once | ORAL | Status: AC
  Administered 2021-09-23: 25 mg via ORAL
  Filled 2021-09-23: qty 1

## 2021-09-23 MED ORDER — ACETAMINOPHEN 325 MG PO TABS
650.0000 mg | ORAL_TABLET | Freq: Once | ORAL | Status: AC
  Administered 2021-09-23: 650 mg via ORAL
  Filled 2021-09-23: qty 2

## 2021-09-23 MED ORDER — HEPARIN SOD (PORK) LOCK FLUSH 100 UNIT/ML IV SOLN
500.0000 [IU] | Freq: Every day | INTRAVENOUS | Status: AC | PRN
  Administered 2021-09-23: 500 [IU]

## 2021-09-23 MED ORDER — SODIUM CHLORIDE 0.9% FLUSH
10.0000 mL | INTRAVENOUS | Status: AC | PRN
  Administered 2021-09-23: 10 mL

## 2021-09-23 NOTE — Progress Notes (Signed)
Hemoglobin 8.7 today. Will give one unit of blood to get patient above 9 per MD orders.  ?

## 2021-09-23 NOTE — Patient Instructions (Signed)
[facility]  Discharge Instructions: ?Thank you for choosing [facility] to provide your oncology and hematology care.  ?If you have a lab appointment with the Cancer Center, please come in thru the Main Entrance and check in at the main information desk. ? ?Wear comfortable clothing and clothing appropriate for easy access to any Portacath or PICC line.  ? ?We strive to give you quality time with your provider. You may need to reschedule your appointment if you arrive late (15 or more minutes).  Arriving late affects you and other patients whose appointments are after yours.  Also, if you miss three or more appointments without notifying the office, you may be dismissed from the clinic at the provider?s discretion.    ?  ?For prescription refill requests, have your pharmacy contact our office and allow 72 hours for refills to be completed.   ? ?Today you received the following chemotherapy and/or immunotherapy agents 1UPRBC    ?  ?To help prevent nausea and vomiting after your treatment, we encourage you to take your nausea medication as directed. ? ?BELOW ARE SYMPTOMS THAT SHOULD BE REPORTED IMMEDIATELY: ?*FEVER GREATER THAN 100.4 F (38 ?C) OR HIGHER ?*CHILLS OR SWEATING ?*NAUSEA AND VOMITING THAT IS NOT CONTROLLED WITH YOUR NAUSEA MEDICATION ?*UNUSUAL SHORTNESS OF BREATH ?*UNUSUAL BRUISING OR BLEEDING ?*URINARY PROBLEMS (pain or burning when urinating, or frequent urination) ?*BOWEL PROBLEMS (unusual diarrhea, constipation, pain near the anus) ?TENDERNESS IN MOUTH AND THROAT WITH OR WITHOUT PRESENCE OF ULCERS (sore throat, sores in mouth, or a toothache) ?UNUSUAL RASH, SWELLING OR PAIN  ?UNUSUAL VAGINAL DISCHARGE OR ITCHING  ? ?Items with * indicate a potential emergency and should be followed up as soon as possible or go to the Emergency Department if any problems should occur. ? ?Please show the CHEMOTHERAPY ALERT CARD or IMMUNOTHERAPY ALERT CARD at check-in to the Emergency  Department and triage nurse. ? ?Should you have questions after your visit or need to cancel or reschedule your appointment, please contact [facility] [phone]  and follow the prompts.  Office hours are 8:00 a.m. to 4:30 p.m. Monday - Friday. Please note that voicemails left after 4:00 p.m. may not be returned until the following business day.  We are closed weekends and major holidays. You have access to a nurse at all times for urgent questions. Please call the main number to the clinic [phone] and follow the prompts. ? ?For any non-urgent questions, you may also contact your provider using MyChart. We now offer e-Visits for anyone 18 and older to request care online for non-urgent symptoms. For details visit [email or web address]. ?  ?Also download the MyChart app! Go to the app store, search "MyChart", open the app, select [facility], and log in with your MyChart username and password. ? ?Due to Covid, a mask is required upon entering the hospital/clinic. If you do not have a mask, one will be given to you upon arrival. For doctor visits, patients may have 1 support person aged 18 or older with them. For treatment visits, patients cannot have anyone with them due to current Covid guidelines and our immunocompromised population.  ?

## 2021-09-23 NOTE — Progress Notes (Signed)
Patient presents today for possible blood transfusion.  Hgb 8.7, patient to receive 1UPRBC per providers order. ? ?1UPRBC given today per MD orders.  Stable during infusion without adverse affects.  Vital signs stable.  No complaints at this time.  Discharge from clinic ambulatory in stable condition.  Alert and oriented X 3.  Follow up with [facility] as scheduled.  ? ? ?

## 2021-09-23 NOTE — Progress Notes (Signed)
Patients port flushed without difficulty.  Good blood return noted with no bruising or swelling noted at site.  Stable during access and blood draw.  Patient to remain accessed for possible blood transfusion.   

## 2021-09-24 ENCOUNTER — Inpatient Hospital Stay (HOSPITAL_COMMUNITY): Payer: Medicare Other

## 2021-09-24 LAB — BPAM RBC
Blood Product Expiration Date: 202305182359
ISSUE DATE / TIME: 202304120948
Unit Type and Rh: 5100

## 2021-09-24 LAB — TYPE AND SCREEN
ABO/RH(D): O POS
Antibody Screen: NEGATIVE
Unit division: 0

## 2021-09-29 ENCOUNTER — Encounter (HOSPITAL_COMMUNITY): Payer: Self-pay | Admitting: Hematology

## 2021-09-30 ENCOUNTER — Inpatient Hospital Stay (HOSPITAL_COMMUNITY): Payer: Medicare Other | Attending: Hematology

## 2021-09-30 ENCOUNTER — Inpatient Hospital Stay (HOSPITAL_COMMUNITY): Payer: Medicare Other

## 2021-09-30 VITALS — BP 102/63 | HR 117 | Temp 98.5°F | Resp 20

## 2021-09-30 DIAGNOSIS — D649 Anemia, unspecified: Secondary | ICD-10-CM | POA: Diagnosis present

## 2021-09-30 DIAGNOSIS — C61 Malignant neoplasm of prostate: Secondary | ICD-10-CM

## 2021-09-30 DIAGNOSIS — Z95828 Presence of other vascular implants and grafts: Secondary | ICD-10-CM

## 2021-09-30 LAB — CBC WITH DIFFERENTIAL/PLATELET
Abs Immature Granulocytes: 0.28 10*3/uL — ABNORMAL HIGH (ref 0.00–0.07)
Basophils Absolute: 0 10*3/uL (ref 0.0–0.1)
Basophils Relative: 0 %
Eosinophils Absolute: 0 10*3/uL (ref 0.0–0.5)
Eosinophils Relative: 0 %
HCT: 24.6 % — ABNORMAL LOW (ref 39.0–52.0)
Hemoglobin: 7.9 g/dL — ABNORMAL LOW (ref 13.0–17.0)
Immature Granulocytes: 5 %
Lymphocytes Relative: 11 %
Lymphs Abs: 0.7 10*3/uL (ref 0.7–4.0)
MCH: 30.7 pg (ref 26.0–34.0)
MCHC: 32.1 g/dL (ref 30.0–36.0)
MCV: 95.7 fL (ref 80.0–100.0)
Monocytes Absolute: 0.7 10*3/uL (ref 0.1–1.0)
Monocytes Relative: 11 %
Neutro Abs: 4.4 10*3/uL (ref 1.7–7.7)
Neutrophils Relative %: 73 %
Platelets: 32 10*3/uL — ABNORMAL LOW (ref 150–400)
RBC: 2.57 MIL/uL — ABNORMAL LOW (ref 4.22–5.81)
RDW: 18.1 % — ABNORMAL HIGH (ref 11.5–15.5)
WBC: 6 10*3/uL (ref 4.0–10.5)
nRBC: 1.3 % — ABNORMAL HIGH (ref 0.0–0.2)

## 2021-09-30 LAB — SAMPLE TO BLOOD BANK

## 2021-09-30 LAB — PREPARE RBC (CROSSMATCH)

## 2021-09-30 MED ORDER — DIPHENHYDRAMINE HCL 25 MG PO CAPS
25.0000 mg | ORAL_CAPSULE | Freq: Once | ORAL | Status: AC
  Administered 2021-09-30: 25 mg via ORAL
  Filled 2021-09-30: qty 1

## 2021-09-30 MED ORDER — SODIUM CHLORIDE 0.9% IV SOLUTION
250.0000 mL | Freq: Once | INTRAVENOUS | Status: AC
  Administered 2021-09-30: 250 mL via INTRAVENOUS

## 2021-09-30 MED ORDER — ACETAMINOPHEN 325 MG PO TABS
650.0000 mg | ORAL_TABLET | Freq: Once | ORAL | Status: AC
  Administered 2021-09-30: 650 mg via ORAL
  Filled 2021-09-30: qty 2

## 2021-09-30 MED ORDER — SODIUM CHLORIDE 0.9% FLUSH
10.0000 mL | INTRAVENOUS | Status: AC | PRN
  Administered 2021-09-30: 10 mL

## 2021-09-30 MED ORDER — HEPARIN SOD (PORK) LOCK FLUSH 100 UNIT/ML IV SOLN
500.0000 [IU] | Freq: Every day | INTRAVENOUS | Status: AC | PRN
  Administered 2021-09-30: 500 [IU]

## 2021-09-30 NOTE — Progress Notes (Unsigned)
Hemoglobing is 7.9 today, platelets are 32. Will give 2 units of blood per MD orders.  ?

## 2021-09-30 NOTE — Progress Notes (Unsigned)
Pt presents today for possible blood transfusion per provider's order. Pt's hemoglobin is 7.9, and platelets are 32. Dr.K made aware stated to give 2 units of blood. ? ?2 units of blood given today per MD orders. Tolerated infusion without adverse affects. Vital signs stable. No complaints at this time. Discharged from clinic via wheelchair in stable condition. Alert and oriented x 3. F/U with [facility] as scheduled.   ?

## 2021-09-30 NOTE — Patient Instructions (Signed)
[facility]  Discharge Instructions: ?Thank you for choosing [facility] to provide your oncology and hematology care.  ?If you have a lab appointment with the Cancer Center, please come in thru the Main Entrance and check in at the main information desk. ? ?Wear comfortable clothing and clothing appropriate for easy access to any Portacath or PICC line.  ? ?We strive to give you quality time with your provider. You may need to reschedule your appointment if you arrive late (15 or more minutes).  Arriving late affects you and other patients whose appointments are after yours.  Also, if you miss three or more appointments without notifying the office, you may be dismissed from the clinic at the provider?s discretion.    ?  ?For prescription refill requests, have your pharmacy contact our office and allow 72 hours for refills to be completed.   ? ?Today you received 2 units of blood. ? ? ?BELOW ARE SYMPTOMS THAT SHOULD BE REPORTED IMMEDIATELY: ?*FEVER GREATER THAN 100.4 F (38 ?C) OR HIGHER ?*CHILLS OR SWEATING ?*NAUSEA AND VOMITING THAT IS NOT CONTROLLED WITH YOUR NAUSEA MEDICATION ?*UNUSUAL SHORTNESS OF BREATH ?*UNUSUAL BRUISING OR BLEEDING ?*URINARY PROBLEMS (pain or burning when urinating, or frequent urination) ?*BOWEL PROBLEMS (unusual diarrhea, constipation, pain near the anus) ?TENDERNESS IN MOUTH AND THROAT WITH OR WITHOUT PRESENCE OF ULCERS (sore throat, sores in mouth, or a toothache) ?UNUSUAL RASH, SWELLING OR PAIN  ?UNUSUAL VAGINAL DISCHARGE OR ITCHING  ? ?Items with * indicate a potential emergency and should be followed up as soon as possible or go to the Emergency Department if any problems should occur. ? ?Please show the CHEMOTHERAPY ALERT CARD or IMMUNOTHERAPY ALERT CARD at check-in to the Emergency Department and triage nurse. ? ?Should you have questions after your visit or need to cancel or reschedule your appointment, please contact [facility] [phone]   and follow the prompts.  Office hours are 8:00 a.m. to 4:30 p.m. Monday - Friday. Please note that voicemails left after 4:00 p.m. may not be returned until the following business day.  We are closed weekends and major holidays. You have access to a nurse at all times for urgent questions. Please call the main number to the clinic [phone] and follow the prompts. ? ?For any non-urgent questions, you may also contact your provider using MyChart. We now offer e-Visits for anyone 18 and older to request care online for non-urgent symptoms. For details visit [email or web address]. ?  ?Also download the MyChart app! Go to the app store, search "MyChart", open the app, select [facility], and log in with your MyChart username and password. ? ?Due to Covid, a mask is required upon entering the hospital/clinic. If you do not have a mask, one will be given to you upon arrival. For doctor visits, patients may have 1 support person aged 18 or older with them. For treatment visits, patients cannot have anyone with them due to current Covid guidelines and our immunocompromised population.  ?

## 2021-10-01 ENCOUNTER — Inpatient Hospital Stay (HOSPITAL_COMMUNITY): Payer: Medicare Other

## 2021-10-01 LAB — TYPE AND SCREEN
ABO/RH(D): O POS
Antibody Screen: NEGATIVE
Unit division: 0
Unit division: 0

## 2021-10-01 LAB — BPAM RBC
Blood Product Expiration Date: 202305192359
Blood Product Expiration Date: 202305192359
ISSUE DATE / TIME: 202304191037
ISSUE DATE / TIME: 202304191233
Unit Type and Rh: 5100
Unit Type and Rh: 5100

## 2021-10-07 ENCOUNTER — Encounter (HOSPITAL_COMMUNITY): Payer: Self-pay

## 2021-10-07 ENCOUNTER — Inpatient Hospital Stay (HOSPITAL_COMMUNITY): Payer: Medicare Other

## 2021-10-07 DIAGNOSIS — Z95828 Presence of other vascular implants and grafts: Secondary | ICD-10-CM

## 2021-10-07 DIAGNOSIS — D649 Anemia, unspecified: Secondary | ICD-10-CM | POA: Diagnosis not present

## 2021-10-07 DIAGNOSIS — C61 Malignant neoplasm of prostate: Secondary | ICD-10-CM

## 2021-10-07 LAB — CBC WITH DIFFERENTIAL/PLATELET
Abs Immature Granulocytes: 0.29 10*3/uL — ABNORMAL HIGH (ref 0.00–0.07)
Basophils Absolute: 0 10*3/uL (ref 0.0–0.1)
Basophils Relative: 1 %
Eosinophils Absolute: 0 10*3/uL (ref 0.0–0.5)
Eosinophils Relative: 0 %
HCT: 29.6 % — ABNORMAL LOW (ref 39.0–52.0)
Hemoglobin: 9.7 g/dL — ABNORMAL LOW (ref 13.0–17.0)
Immature Granulocytes: 5 %
Lymphocytes Relative: 10 %
Lymphs Abs: 0.6 10*3/uL — ABNORMAL LOW (ref 0.7–4.0)
MCH: 30.5 pg (ref 26.0–34.0)
MCHC: 32.8 g/dL (ref 30.0–36.0)
MCV: 93.1 fL (ref 80.0–100.0)
Monocytes Absolute: 0.5 10*3/uL (ref 0.1–1.0)
Monocytes Relative: 7 %
Neutro Abs: 4.9 10*3/uL (ref 1.7–7.7)
Neutrophils Relative %: 77 %
Platelets: 33 10*3/uL — ABNORMAL LOW (ref 150–400)
RBC: 3.18 MIL/uL — ABNORMAL LOW (ref 4.22–5.81)
RDW: 19.8 % — ABNORMAL HIGH (ref 11.5–15.5)
WBC: 6.3 10*3/uL (ref 4.0–10.5)
nRBC: 1.7 % — ABNORMAL HIGH (ref 0.0–0.2)

## 2021-10-07 LAB — SAMPLE TO BLOOD BANK

## 2021-10-07 MED ORDER — SODIUM CHLORIDE 0.9% FLUSH
10.0000 mL | Freq: Once | INTRAVENOUS | Status: AC
  Administered 2021-10-07: 10 mL via INTRAVENOUS

## 2021-10-07 MED ORDER — HEPARIN SOD (PORK) LOCK FLUSH 100 UNIT/ML IV SOLN
500.0000 [IU] | Freq: Once | INTRAVENOUS | Status: AC
  Administered 2021-10-07: 500 [IU] via INTRAVENOUS

## 2021-10-07 NOTE — Patient Instructions (Signed)
[facility]  Discharge Instructions: Thank you for choosing [facility] to provide your oncology and hematology care.  If you have a lab appointment with the Cancer Center, please come in thru the Main Entrance and check in at the main information desk.  Wear comfortable clothing and clothing appropriate for easy access to any Portacath or PICC line.   We strive to give you quality time with your provider. You may need to reschedule your appointment if you arrive late (15 or more minutes).  Arriving late affects you and other patients whose appointments are after yours.  Also, if you miss three or more appointments without notifying the office, you may be dismissed from the clinic at the provider's discretion.      For prescription refill requests, have your pharmacy contact our office and allow 72 hours for refills to be completed.        To help prevent nausea and vomiting after your treatment, we encourage you to take your nausea medication as directed.  BELOW ARE SYMPTOMS THAT SHOULD BE REPORTED IMMEDIATELY: *FEVER GREATER THAN 100.4 F (38 C) OR HIGHER *CHILLS OR SWEATING *NAUSEA AND VOMITING THAT IS NOT CONTROLLED WITH YOUR NAUSEA MEDICATION *UNUSUAL SHORTNESS OF BREATH *UNUSUAL BRUISING OR BLEEDING *URINARY PROBLEMS (pain or burning when urinating, or frequent urination) *BOWEL PROBLEMS (unusual diarrhea, constipation, pain near the anus) TENDERNESS IN MOUTH AND THROAT WITH OR WITHOUT PRESENCE OF ULCERS (sore throat, sores in mouth, or a toothache) UNUSUAL RASH, SWELLING OR PAIN  UNUSUAL VAGINAL DISCHARGE OR ITCHING   Items with * indicate a potential emergency and should be followed up as soon as possible or go to the Emergency Department if any problems should occur.  Please show the CHEMOTHERAPY ALERT CARD or IMMUNOTHERAPY ALERT CARD at check-in to the Emergency Department and triage nurse.  Should you have questions after your visit or need to cancel  or reschedule your appointment, please contact [facility] [phone]  and follow the prompts.  Office hours are 8:00 a.m. to 4:30 p.m. Monday - Friday. Please note that voicemails left after 4:00 p.m. may not be returned until the following business day.  We are closed weekends and major holidays. You have access to a nurse at all times for urgent questions. Please call the main number to the clinic [phone] and follow the prompts.  For any non-urgent questions, you may also contact your provider using MyChart. We now offer e-Visits for anyone 18 and older to request care online for non-urgent symptoms. For details visit [email or web address].   Also download the MyChart app! Go to the app store, search "MyChart", open the app, select [facility], and log in with your MyChart username and password.  Due to Covid, a mask is required upon entering the hospital/clinic. If you do not have a mask, one will be given to you upon arrival. For doctor visits, patients may have 1 support person aged 18 or older with them. For treatment visits, patients cannot have anyone with them due to current Covid guidelines and our immunocompromised population.  

## 2021-10-07 NOTE — Progress Notes (Signed)
Hgb 9.7 and Platelets 33 and reviewed with Dr. Delton Coombes.  No complaints of bleeding and ok to discharge per oncologist.  ?

## 2021-10-12 ENCOUNTER — Ambulatory Visit (INDEPENDENT_AMBULATORY_CARE_PROVIDER_SITE_OTHER): Payer: Medicare Other | Admitting: Cardiology

## 2021-10-12 ENCOUNTER — Encounter: Payer: Self-pay | Admitting: Cardiology

## 2021-10-12 ENCOUNTER — Encounter (HOSPITAL_COMMUNITY): Payer: Self-pay | Admitting: Hematology

## 2021-10-12 VITALS — BP 131/82 | HR 122

## 2021-10-12 DIAGNOSIS — I25119 Atherosclerotic heart disease of native coronary artery with unspecified angina pectoris: Secondary | ICD-10-CM

## 2021-10-12 NOTE — Patient Instructions (Signed)
Medication Instructions:  Continue all current medications.  Labwork: none  Testing/Procedures: none  Follow-Up: As needed.    Any Other Special Instructions Will Be Listed Below (If Applicable).  If you need a refill on your cardiac medications before your next appointment, please call your pharmacy.  

## 2021-10-12 NOTE — Progress Notes (Signed)
? ?Virtual Visit via Telephone Note  ? ?This visit type was conducted due to national recommendations for restrictions regarding the COVID-19 Pandemic (e.g. social distancing) in an effort to limit this patient's exposure and mitigate transmission in our community.  Due to his co-morbid illnesses, this patient is at least at moderate risk for complications without adequate follow up.  This format is felt to be most appropriate for this patient at this time.  The patient did not have access to video technology/had technical difficulties with video requiring transitioning to audio format only (telephone).  All issues noted in this document were discussed and addressed.  No physical exam could be performed with this format.  Please refer to the patient's chart for his  consent to telehealth for [facility].  ? ? ?Date:  10/12/2021  ? ?ID:  Hunter Jones, DOB February 28, 1956, MRN [id number] ?The patient was identified using 2 identifiers. ? ?Patient Location: Home ?Provider Location: Office/Clinic ? ? ?PCP:  Monico Blitz, MD ?  ?[facility] Providers ?Cardiologist:  Rozann Lesches, MD { ? ?Evaluation Performed:  Follow-Up Visit ? ?Chief Complaint:  Cardiac follow-up ? ?History of Present Illness:   ? ?Hunter Jones is a 66 y.o. male last seen in February.  We spoke by phone today, his wife was also on the line. ? ?He has metastatic prostate cancer, under the care of Dr. Delton Coombes and has been in the hospice program.  He has received PRBC transfusions for symptomatic anemia. ? ?Generally feels weak and fatigued, only gets out of the house for his oncology follow-up visits and transfusions. ? ?His medications have been substantially simplified as noted below.  No active angina or nitroglycerin use. ? ?Past Medical History:  ?Diagnosis Date  ? Acute ST elevation myocardial infarction (STEMI) of inferior wall ([place]) 11/2019  ? Asthma   ? COPD (chronic obstructive pulmonary disease) ([place])   ? Coronary artery disease   ? DES  to mid LAD August 2014 ([facility]); overlapping DESx2 prox-mid RCA 11/2019  ? Essential hypertension   ? Hyperthyroidism   ? NSTEMI (non-ST elevated myocardial infarction) ([facility]) 2014  ? Port-A-Cath in place 06/17/2020  ? Prostate cancer ([place])   ? Type 2 diabetes mellitus ([place])   ? ?Past Surgical History:  ?Procedure Laterality Date  ? APPENDECTOMY    ? CORONARY/GRAFT ACUTE MI REVASCULARIZATION N/A 12/10/2019  ? Procedure: Coronary/Graft Acute MI Revascularization;  Surgeon: Nelva Bush, MD;  Location: [facility];  Service: Cardiovascular;  Laterality: N/A;  ? IR IMAGING GUIDED PORT INSERTION  06/19/2020  ? LEFT HEART CATH AND CORONARY ANGIOGRAPHY N/A 12/10/2019  ? Procedure: LEFT HEART CATH AND CORONARY ANGIOGRAPHY;  Surgeon: Nelva Bush, MD;  Location: [facility];  Service: Cardiovascular;  Laterality: N/A;  ? LYMPHADENECTOMY Bilateral 06/27/2014  ? Procedure: LYMPHADENECTOMY;  Surgeon: Raynelle Bring, MD;  Location: [facility];  Service: Urology;  Laterality: Bilateral;  ? ROBOT ASSISTED LAPAROSCOPIC RADICAL PROSTATECTOMY N/A 06/27/2014  ? Procedure: ROBOTIC ASSISTED LAPAROSCOPIC RADICAL PROSTATECTOMY LEVEL 3;  Surgeon: Raynelle Bring, MD;  Location: [facility];  Service: Urology;  Laterality: N/A;  ? TONSILLECTOMY    ?  ? ?Current Meds  ?Medication Sig  ? ADVAIR DISKUS 250-50 MCG/ACT AEPB Inhale 1 puff into the lungs 2 (two) times daily.  ? AFRIN NASAL SPRAY 0.05 % nasal spray PLACE 1 SPRAY INTO EACH(NOSTRIL TWICE DAILY.  ? albuterol (PROVENTIL HFA;VENTOLIN HFA) 108 (90 BASE) MCG/ACT inhaler Inhale 1-2 puffs into the lungs every 6 (six) hours  as needed for wheezing or shortness of breath.  ? ALPRAZolam (XANAX) 0.5 MG tablet Take 1 tablet (0.5 mg total) by mouth every 8 (eight) hours as needed for anxiety.  ? Ascorbic Acid (VITAMIN C PO) Take 1 tablet by mouth daily.  ? Calcium Carb-Cholecalciferol (HM CALCIUM-VITAMIN D) 600-800 MG-UNIT TABS Take 1 tablet by mouth daily. 1800 mg per day  ? citalopram (CELEXA)  10 MG tablet Take 1 tablet (10 mg total) by mouth daily.  ? HYDROcodone-acetaminophen (NORCO/VICODIN) 5-325 MG tablet Take 1 tablet by mouth 4 (four) times daily as needed.  ? Ibuprofen-diphenhydrAMINE HCl (IBUPROFEN PM) 200-25 MG CAPS Take 1 tablet by mouth at bedtime as needed (sleep).  ? LORazepam (ATIVAN) 1 MG tablet Take by mouth.  ? methimazole (TAPAZOLE) 10 MG tablet Take 10 mg by mouth every other day.   ? Multiple Vitamin (MULTIVITAMIN) tablet Take 1 tablet by mouth daily.  ? nitroGLYCERIN (NITROSTAT) 0.4 MG SL tablet Take 0.4 mg by mouth as needed.  ? Oxycodone HCl 10 MG TABS Take 1 tablet (10 mg total) by mouth every 8 (eight) hours.  ? Potassium 99 MG TABS Take 99 mg by mouth daily.  ? predniSONE (DELTASONE) 5 MG tablet TAKE 1 TABLET BY MOUTH EVERY DAY WITH BREAKFAST  ?  ?Allergies:   Xtandi [enzalutamide] and Penicillins  ? ?ROS:   ?Please see the history of present illness. ?All other systems reviewed and are negative. ? ?Prior CV studies:   ?The following studies were reviewed today: ? ?Lexiscan Myoview 09/08/2020: ?No diagnostic ST segment changes to indicate ischemia. ?Moderate intensity, apical to basal inferolateral defect that is partially reversible suggesting scar with mild peri-infarct ischemia. There is also radiotracer uptake within the gut adjacent to the inferior wall on rest imaging which could be causing artifactual reversibility in this area. ?This is an intermediate risk study, based on calculated LVEF. Suggest echocardiogram to clarify if not performed recently. ?Nuclear stress EF: 39%. ?  ?Echocardiogram 09/10/2020: ? 1. Left ventricular ejection fraction, by estimation, is 50 to 55%. The  ?left ventricle has low normal function. The left ventricle demonstrates  ?regional wall motion abnormalities (see scoring diagram/findings for  ?description). The left ventricular  ?internal cavity size was mildly dilated. There is mild left ventricular  ?hypertrophy. Left ventricular diastolic  parameters were normal.  ? 2. Right ventricular systolic function is normal. The right ventricular  ?size is normal. Tricuspid regurgitation signal is inadequate for assessing  ?PA pressure.  ? 3. Left atrial size was mildly dilated.  ? 4. The mitral valve is grossly normal. Trivial mitral valve  ?regurgitation.  ? 5. The aortic valve is tricuspid. Aortic valve regurgitation is mild.  ?Mild aortic valve sclerosis is present, with no evidence of aortic valve  ?stenosis.  ? 6. The inferior vena cava is normal in size with greater than 50%  ?respiratory variability, suggesting right atrial pressure of 3 mmHg.  ? ?Labs/Other Tests and Data Reviewed:   ? ?EKG:  An ECG dated 03/07/2021 was personally reviewed today and demonstrated:  Sinus rhythm. ? ?Recent Labs: ?06/22/2021: TSH 0.859 ?09/23/2021: ALT 24; BUN 22; Creatinine, Ser 0.86; Magnesium 1.7; Potassium 4.2; Sodium 133 ?10/07/2021: Hemoglobin 9.7; Platelets 33  ? ?Recent Lipid Panel ?Lab Results  ?Component Value Date/Time  ? CHOL 68 12/10/2019 11:51 PM  ? TRIG 91 12/10/2019 11:51 PM  ? HDL 28 (L) 12/10/2019 11:51 PM  ? CHOLHDL 2.4 12/10/2019 11:51 PM  ? [place] 22 12/10/2019 11:51 PM  ? ? ?  Wt Readings from Last 3 Encounters:  ?09/02/21 212 lb 1.3 oz (96.2 kg)  ?08/12/21 217 lb 12.8 oz (98.8 kg)  ?08/03/21 222 lb 3.2 oz (100.8 kg)  ?  ?    ?Objective:   ? ?Vital Signs:  BP 131/82   Pulse (!) 122   SpO2 99%   ? ?Patient was not examined. ? ?ASSESSMENT & PLAN:   ? ?1.  CAD with history of DES to the LAD in 2014 with overlapping DES x2 to the proximal and mid RCA in 2021.  No active angina, but medical therapy substantially simplified in the setting of metastatic prostate cancer and hospice care at home.  Currently has as needed nitroglycerin available for use. ? ?2.  Metastatic prostate cancer to bone, no longer undergoing active treatment with symptomatic support and home hospice.  Has received PRBC transfusions due to symptomatic anemia, also remains  thrombocytopenic. ? ?  ?Time:   ?Today, I have spent 9 minutes with the patient with telehealth technology discussing the above problems.   ? ? ?Medication Adjustments/Labs and Tests Ordered: ?Current medicines are reviewed

## 2021-10-12 NOTE — Progress Notes (Signed)
?  Patient Consent for Virtual Visit  ? ? ?   ? ?Hunter Jones has provided verbal consent on 10/12/2021 for a virtual visit (video or telephone). ? ? ?CONSENT FOR VIRTUAL VISIT FOR:  Hunter Jones  ?By participating in this virtual visit I agree to the following: ? ?I hereby voluntarily request, consent and authorize [facility] and its employed or contracted physicians, physician assistants, nurse practitioners or other licensed health care professionals (the Practitioner), to provide me with telemedicine health care services (the ?Services") as deemed necessary by the treating Practitioner. I acknowledge and consent to receive the Services by the Practitioner via telemedicine. I understand that the telemedicine visit will involve communicating with the Practitioner through live audiovisual communication technology and the disclosure of certain medical information by electronic transmission. I acknowledge that I have been given the opportunity to request an in-person assessment or other available alternative prior to the telemedicine visit and am voluntarily participating in the telemedicine visit. ? ?I understand that I have the right to withhold or withdraw my consent to the use of telemedicine in the course of my care at any time, without affecting my right to future care or treatment, and that the Practitioner or I may terminate the telemedicine visit at any time. I understand that I have the right to inspect all information obtained and/or recorded in the course of the telemedicine visit and may receive copies of available information for a reasonable fee.  I understand that some of the potential risks of receiving the Services via telemedicine include:  ?Delay or interruption in medical evaluation due to technological equipment failure or disruption; ?Information transmitted may not be sufficient (e.g. poor resolution of images) to allow for appropriate medical decision making by the Practitioner; and/or   ?In rare instances, security protocols could fail, causing a breach of personal health information. ? ?Furthermore, I acknowledge that it is my responsibility to provide information about my medical history, conditions and care that is complete and accurate to the best of my ability. I acknowledge that Practitioner's advice, recommendations, and/or decision may be based on factors not within their control, such as incomplete or inaccurate data provided by me or distortions of diagnostic images or specimens that may result from electronic transmissions. I understand that the practice of medicine is not an exact science and that Practitioner makes no warranties or guarantees regarding treatment outcomes. I acknowledge that a copy of this consent can be made available to me via my patient portal ([facility]), or I can request a printed copy by calling the office of [facility].   ? ?I understand that my insurance will be billed for this visit.  ? ?I have read or had this consent read to me. ?I understand the contents of this consent, which adequately explains the benefits and risks of the Services being provided via telemedicine.  ?I have been provided ample opportunity to ask questions regarding this consent and the Services and have had my questions answered to my satisfaction. ?I give my informed consent for the services to be provided through the use of telemedicine in my medical care ? ?  ?

## 2021-10-14 ENCOUNTER — Inpatient Hospital Stay (HOSPITAL_COMMUNITY): Payer: Medicare Other

## 2021-10-14 ENCOUNTER — Telehealth (HOSPITAL_COMMUNITY): Payer: Self-pay | Admitting: *Deleted

## 2021-10-14 NOTE — Telephone Encounter (Addendum)
Received call from wife stating that he is extremely weak and is unable to walk but very short distances, very SOB and will not be able to get to appointment today.  Advised her to call 911 and have him taken to the ER for evaluation.  Verbalized understanding. Yolanda @ [facility] made aware. ?

## 2021-10-21 ENCOUNTER — Inpatient Hospital Stay (HOSPITAL_COMMUNITY): Payer: Medicare Other

## 2021-10-28 ENCOUNTER — Inpatient Hospital Stay (HOSPITAL_COMMUNITY): Payer: Medicare Other

## 2021-11-04 ENCOUNTER — Inpatient Hospital Stay (HOSPITAL_COMMUNITY): Payer: Medicare Other

## 2021-11-12 DEATH — deceased

## 2022-01-18 ENCOUNTER — Ambulatory Visit (HOSPITAL_COMMUNITY): Payer: Medicare Other

## 2022-01-19 ENCOUNTER — Ambulatory Visit: Payer: Medicare Other | Admitting: Cardiology

## 2022-08-25 IMAGING — XA IR IMAGING GUIDED PORT INSERTION
1 series · 1 of 1 positions shown · non-contrast
Comparison: None.

INDICATION: 64-year-old with metastatic prostate cancer.

EXAM:
FLUOROSCOPIC AND ULTRASOUND GUIDED PLACEMENT OF A SUBCUTANEOUS PORT

[Series 2: fl (-) angio · 1 of 1 slices shown]
[im 1/1]
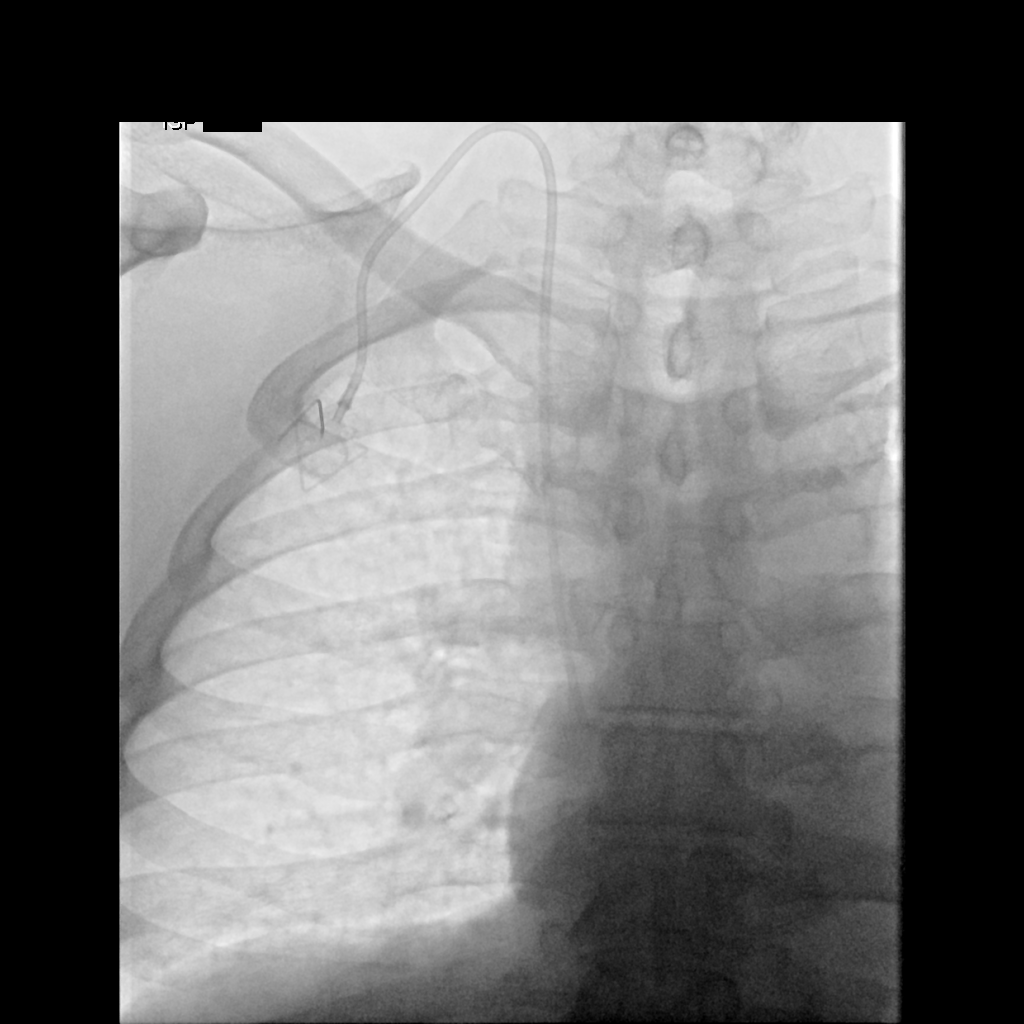

[1 of 1 positions shown; findings below may reference images not displayed]

MEDICATIONS:
Vancomycin 1 g; The antibiotic was administered within an
appropriate time interval prior to skin puncture.

ANESTHESIA/SEDATION:
Versed 1.5 mg IV; Fentanyl 50 mcg IV;

Moderate Sedation Time:  24 minutes

The patient was continuously monitored during the procedure by the
interventional radiology nurse under my direct supervision.

FLUOROSCOPY TIME:  12 seconds, 1 mGy

COMPLICATIONS:
None immediate.

PROCEDURE:
The procedure, risks, benefits, and alternatives were explained to
the patient. Questions regarding the procedure were encouraged and
answered. The patient understands and consents to the procedure.

Patient was placed supine on the interventional table. Ultrasound
confirmed a patent right internal jugular vein. Ultrasound image was
saved for documentation. The right chest and neck were cleaned with
a skin antiseptic and a sterile drape was placed. Maximal barrier
sterile technique was utilized including caps, mask, sterile gowns,
sterile gloves, sterile drape, hand hygiene and skin antiseptic. The
right neck was anesthetized with 1% lidocaine. Small incision was
made in the right neck with a blade. Micropuncture set was placed in
the right internal jugular vein with ultrasound guidance. The
micropuncture wire was used for measurement purposes. The right
chest was anesthetized with 1% lidocaine with epinephrine. #15 blade
was used to make an incision and a subcutaneous port pocket was
formed. 8 french Power Port was assembled. Subcutaneous tunnel was
formed with a stiff tunneling device. The port catheter was brought
through the subcutaneous tunnel. The port was placed in the
subcutaneous pocket. The micropuncture set was exchanged for a
peel-away sheath. The catheter was placed through the peel-away
sheath and the tip was positioned at the superior cavoatrial
junction. Catheter placement was confirmed with fluoroscopy. The
port was accessed and flushed with heparinized saline. The port
pocket was closed using two layers of absorbable sutures and
Dermabond. The vein skin site was closed using a single layer of
absorbable suture and Dermabond. Sterile dressings were applied.
Patient tolerated the procedure well without an immediate
complication. Ultrasound and fluoroscopic images were taken and
saved for this procedure.
IMPRESSION: Placement of a subcutaneous port device. Catheter tip at the
superior cavoatrial junction.

## 2022-10-25 IMAGING — NM NM BONE WHOLE BODY
2 series · 2 of 2 positions shown · non-contrast
Comparison: CT 06/02/2020.  Bone scan 06/02/2020.

CLINICAL DATA: Prostate cancer, surveillance.

EXAM:
NUCLEAR MEDICINE WHOLE BODY BONE SCAN
TECHNIQUE: Whole body anterior and posterior images were obtained approximately
3 hours after intravenous injection of radiopharmaceutical.
RADIOPHARMACEUTICALS:  20.3 mCi Mechnetium-WWm MDP IV

[Series 1: whole body · 2.66mm/px · 1 of 1 slices shown (1 of 2)]
[im 1/1]
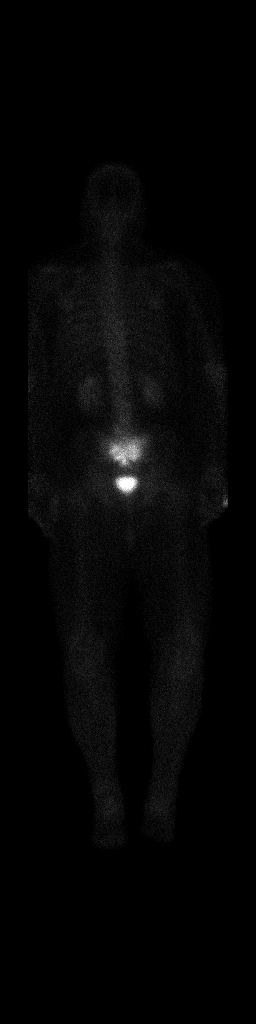

[Series 1: whole body · 2.66mm/px · 1 of 1 slices shown (2 of 2)]
[im 1/1]
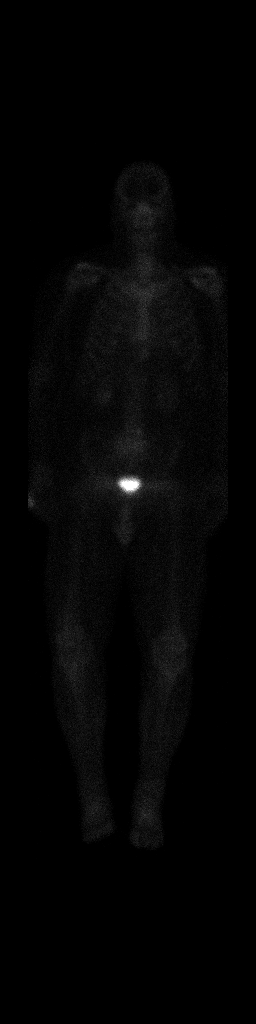

[2 of 2 positions shown; findings below may reference images not displayed]

FINDINGS: Standard whole-body bone scan obtained. Bilateral renal function and
excretion present. Persistent unchanged increased activity noted in
the sacrum. Other bony sclerotic lesions previously identified by CT
do not demonstrate definite increased radiopharmaceutical uptake.
Bone scan appears stable from prior exam.
IMPRESSION: Persistent unchanged increased activity noted the sacrum. Other bony
sclerotic lesions previously identified by CT do not demonstrate
definite increased radiopharmaceutical uptake. Bone scan appears
stable from prior exam.

## 2023-01-06 IMAGING — CT CT ABD-PELV W/O CM
2 of 4 series · 16 of 46 positions shown, 18 images · non-contrast
Comparison: 06/02/2020

CLINICAL DATA: Follow-up metastatic prostate carcinoma.

EXAM:
CT ABDOMEN AND PELVIS WITHOUT CONTRAST
TECHNIQUE: Multidetector CT imaging of the abdomen and pelvis was performed
following the standard protocol without IV contrast.

[Series 2: axial st · axial · 0.92mm/px · z∈[+860,+1355]mm · 13 of 115 slices shown, 15 images]
[im 8/115  soft-tissue]
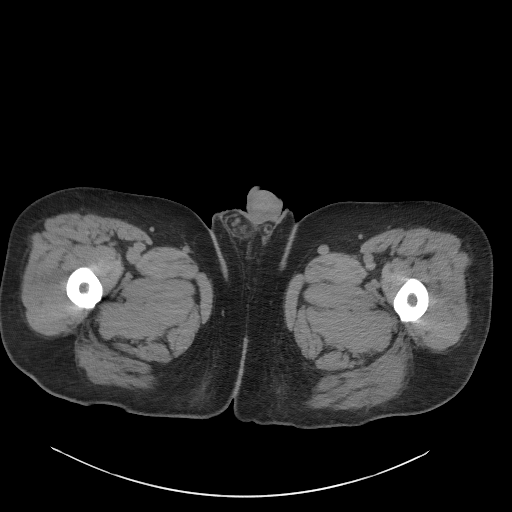
[im 8/115  bone]
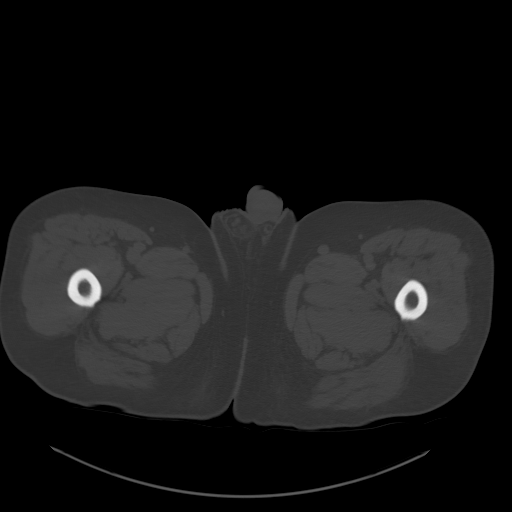
[im 16/115  soft-tissue]
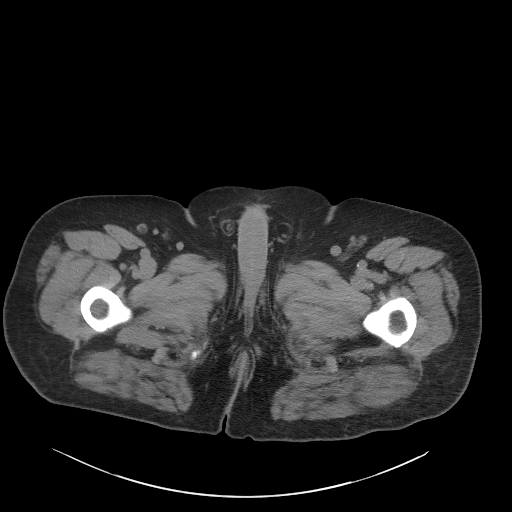
[im 23/115  soft-tissue]
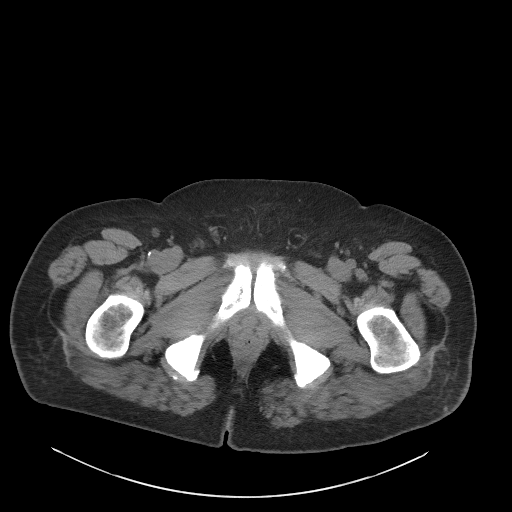
[im 31/115  soft-tissue]
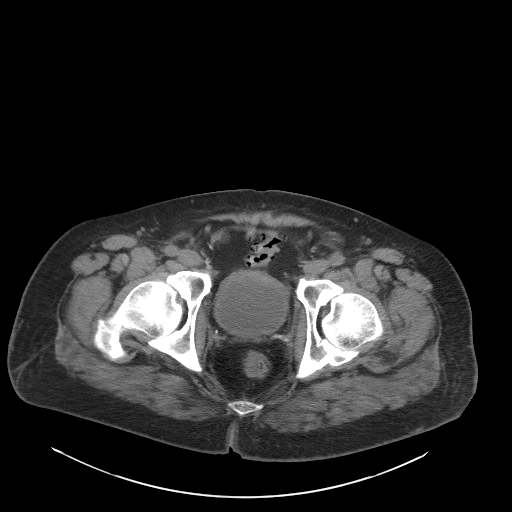
[im 39/115  soft-tissue]
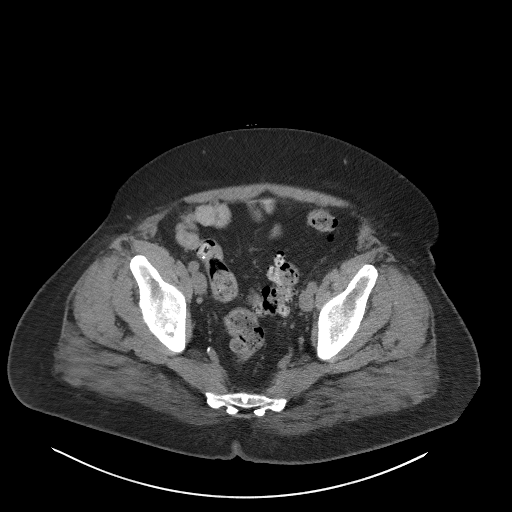
[im 46/115  soft-tissue]
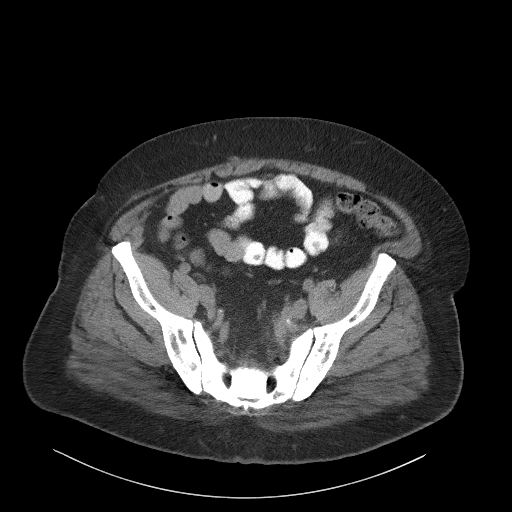
[im 61/115  soft-tissue]
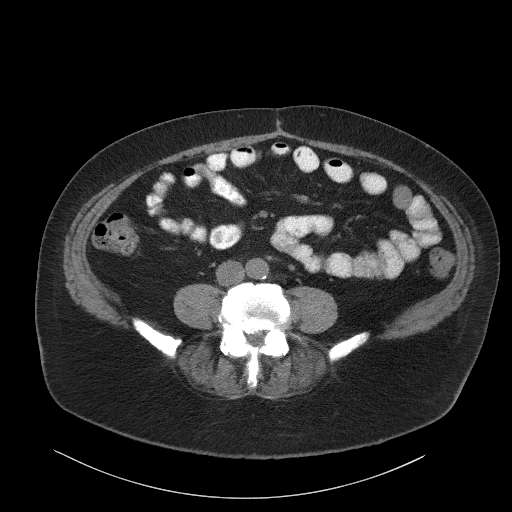
[im 69/115  soft-tissue]
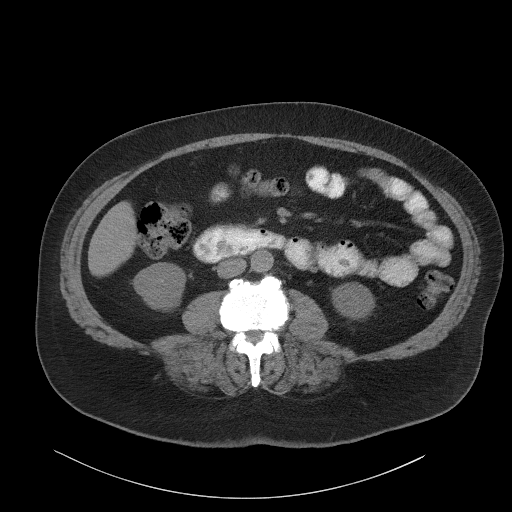
[im 77/115  soft-tissue]
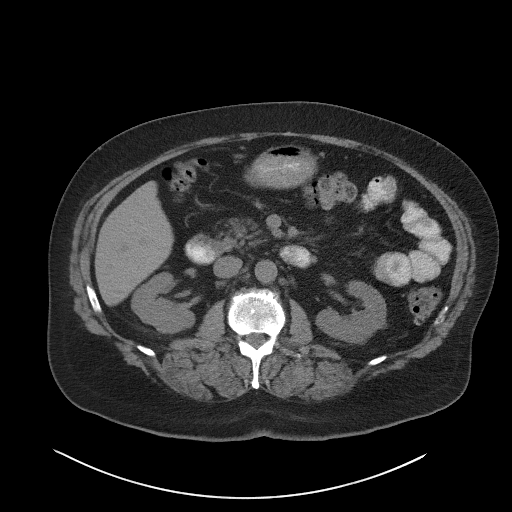
[im 77/115  bone]
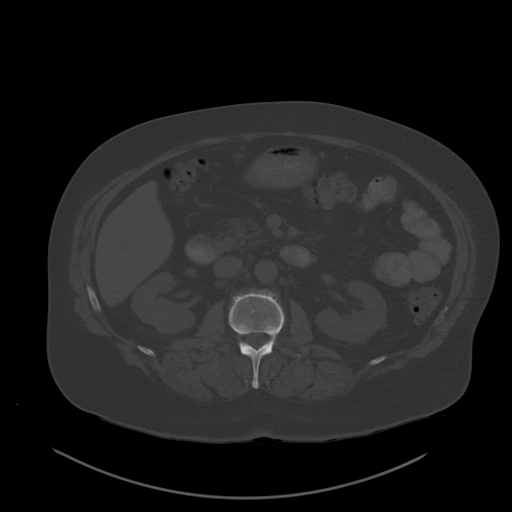
[im 84/115  soft-tissue]
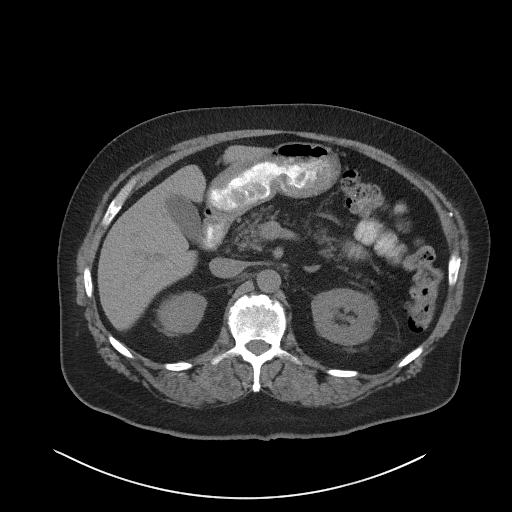
[im 92/115  soft-tissue]
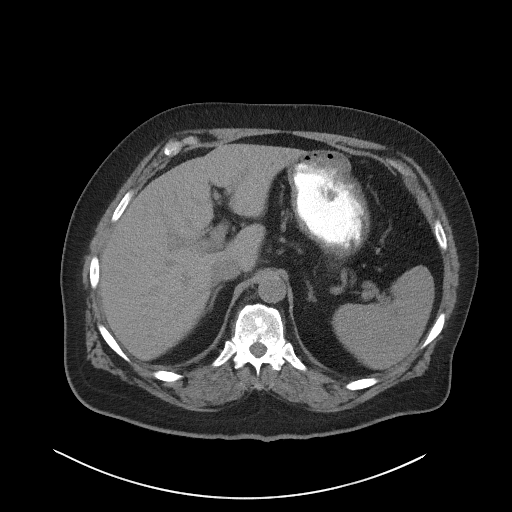
[im 99/115  soft-tissue]
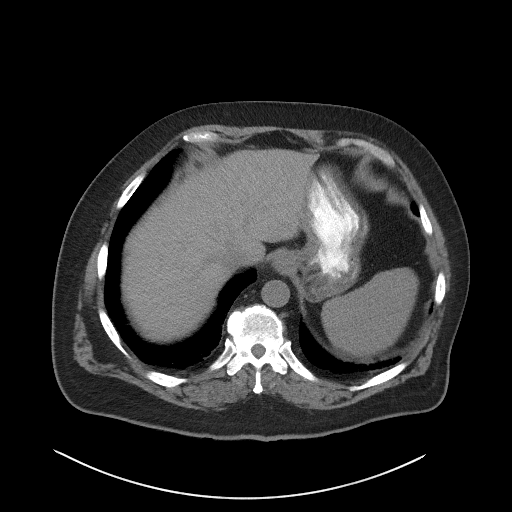
[im 107/115  soft-tissue]
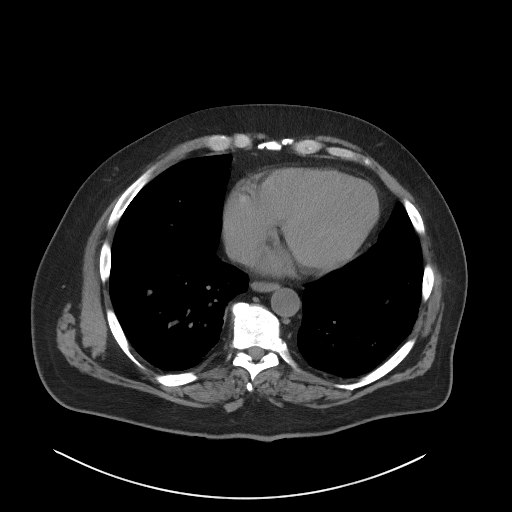

[Series 5: coronal st · coronal · 0.94mm/px · 3 of 119 slices shown]
[im 40/119  soft-tissue]
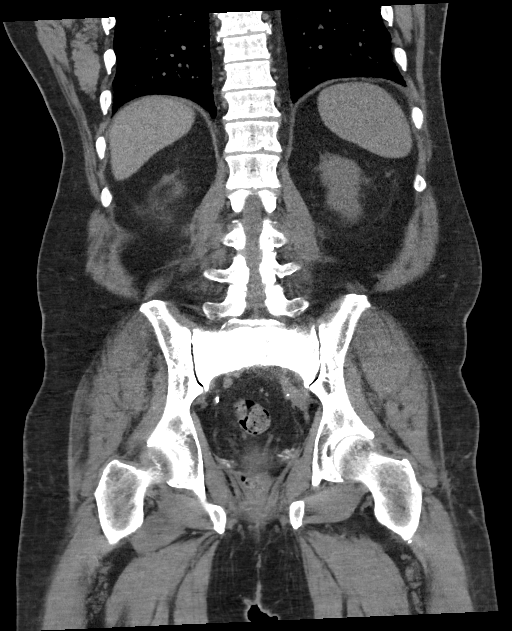
[im 53/119  soft-tissue]
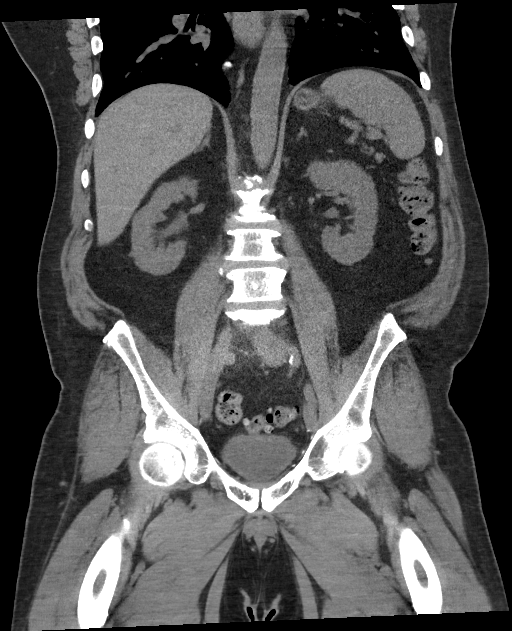
[im 66/119  soft-tissue]
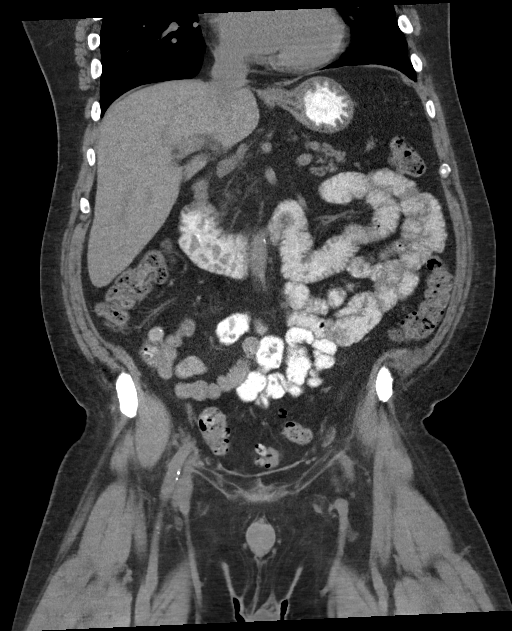

[16 of 46 positions shown; findings below may reference images not displayed]

FINDINGS: Lower chest: No acute findings.

Hepatobiliary: No mass visualized on this unenhanced exam.
Gallbladder is unremarkable. No evidence of biliary ductal
dilatation.

Pancreas: No mass or inflammatory process visualized on this
unenhanced exam.

Spleen:  Within normal limits in size.

Adrenals/Urinary tract: No evidence of urolithiasis or
hydronephrosis. Unremarkable unopacified urinary bladder.

Stomach/Bowel: No evidence of obstruction, inflammatory process, or
abnormal fluid collections. Diverticulosis is seen mainly involving
the sigmoid colon, however there is no evidence of diverticulitis.

Vascular/Lymphatic: No pathologically enlarged lymph nodes
identified. No evidence of abdominal aortic aneurysm. Aortic
atherosclerotic calcification noted.

Reproductive: Prior prostatectomy again noted. No soft tissue masses
identified.

Other:  None.

Musculoskeletal: Diffuse sclerotic bone metastases throughout the
spine, sacrum, pelvis, ribs, and right femoral head show no
significant interval change.
IMPRESSION: Diffuse sclerotic bone metastases, without significant interval
change.

No evidence of soft tissue metastatic disease or other acute
findings.

Colonic diverticulosis. No radiographic evidence of diverticulitis.

Aortic Atherosclerosis ([id number]).
